# Patient Record
Sex: Male | Born: 1955 | Race: White | Hispanic: No | Smoking: Former smoker
Health system: Southern US, Community
[De-identification: ages and names within clinical notes are randomized; demographics above are authoritative.]

## PROBLEM LIST (undated history)

## (undated) DIAGNOSIS — I2699 Other pulmonary embolism without acute cor pulmonale: Secondary | ICD-10-CM

## (undated) DIAGNOSIS — I2510 Atherosclerotic heart disease of native coronary artery without angina pectoris: Secondary | ICD-10-CM

## (undated) DIAGNOSIS — I82409 Acute embolism and thrombosis of unspecified deep veins of unspecified lower extremity: Secondary | ICD-10-CM

## (undated) DIAGNOSIS — I38 Endocarditis, valve unspecified: Secondary | ICD-10-CM

## (undated) DIAGNOSIS — R011 Cardiac murmur, unspecified: Secondary | ICD-10-CM

## (undated) DIAGNOSIS — H269 Unspecified cataract: Secondary | ICD-10-CM

## (undated) DIAGNOSIS — I10 Essential (primary) hypertension: Secondary | ICD-10-CM

## (undated) DIAGNOSIS — R918 Other nonspecific abnormal finding of lung field: Secondary | ICD-10-CM

## (undated) DIAGNOSIS — J189 Pneumonia, unspecified organism: Secondary | ICD-10-CM

## (undated) DIAGNOSIS — J449 Chronic obstructive pulmonary disease, unspecified: Secondary | ICD-10-CM

## (undated) DIAGNOSIS — F32A Depression, unspecified: Secondary | ICD-10-CM

## (undated) DIAGNOSIS — F329 Major depressive disorder, single episode, unspecified: Secondary | ICD-10-CM

## (undated) DIAGNOSIS — I219 Acute myocardial infarction, unspecified: Secondary | ICD-10-CM

## (undated) DIAGNOSIS — D649 Anemia, unspecified: Secondary | ICD-10-CM

## (undated) DIAGNOSIS — Z86718 Personal history of other venous thrombosis and embolism: Secondary | ICD-10-CM

## (undated) DIAGNOSIS — J45909 Unspecified asthma, uncomplicated: Secondary | ICD-10-CM

## (undated) DIAGNOSIS — K219 Gastro-esophageal reflux disease without esophagitis: Secondary | ICD-10-CM

## (undated) DIAGNOSIS — J439 Emphysema, unspecified: Secondary | ICD-10-CM

## (undated) DIAGNOSIS — Z8489 Family history of other specified conditions: Secondary | ICD-10-CM

## (undated) DIAGNOSIS — E119 Type 2 diabetes mellitus without complications: Secondary | ICD-10-CM

## (undated) DIAGNOSIS — Z21 Asymptomatic human immunodeficiency virus [HIV] infection status: Secondary | ICD-10-CM

## (undated) DIAGNOSIS — I499 Cardiac arrhythmia, unspecified: Secondary | ICD-10-CM

## (undated) DIAGNOSIS — J3089 Other allergic rhinitis: Secondary | ICD-10-CM

## (undated) DIAGNOSIS — B20 Human immunodeficiency virus [HIV] disease: Secondary | ICD-10-CM

## (undated) DIAGNOSIS — M359 Systemic involvement of connective tissue, unspecified: Secondary | ICD-10-CM

## (undated) HISTORY — PX: CARDIAC CATHETERIZATION: SHX172

## (undated) HISTORY — PX: WRIST ARTHROSCOPY: SUR100

## (undated) HISTORY — PX: IVC FILTER INSERTION: CATH118245

## (undated) HISTORY — PX: ANKLE ARTHROSCOPY: SUR85

## (undated) HISTORY — PX: BACK SURGERY: SHX140

## (undated) HISTORY — PX: TONSILLECTOMY: SUR1361

## (undated) HISTORY — PX: SINUS EXPLORATION: SHX5214

---

## 2000-12-01 ENCOUNTER — Emergency Department (HOSPITAL_COMMUNITY): Admission: EM | Admit: 2000-12-01 | Discharge: 2000-12-01 | Payer: Self-pay | Admitting: Emergency Medicine

## 2001-09-22 ENCOUNTER — Emergency Department (HOSPITAL_COMMUNITY): Admission: EM | Admit: 2001-09-22 | Discharge: 2001-09-22 | Payer: Self-pay | Admitting: Emergency Medicine

## 2005-02-11 ENCOUNTER — Emergency Department: Payer: Self-pay | Admitting: Emergency Medicine

## 2005-08-25 ENCOUNTER — Ambulatory Visit: Payer: Self-pay | Admitting: Internal Medicine

## 2005-09-20 ENCOUNTER — Ambulatory Visit: Payer: Self-pay | Admitting: Cardiovascular Disease

## 2005-10-06 ENCOUNTER — Emergency Department: Payer: Self-pay | Admitting: Emergency Medicine

## 2005-10-26 ENCOUNTER — Ambulatory Visit: Payer: Self-pay | Admitting: Podiatry

## 2005-12-08 ENCOUNTER — Ambulatory Visit: Payer: Self-pay | Admitting: Podiatry

## 2005-12-27 ENCOUNTER — Ambulatory Visit: Payer: Self-pay | Admitting: Internal Medicine

## 2005-12-31 ENCOUNTER — Ambulatory Visit: Payer: Self-pay | Admitting: Internal Medicine

## 2006-01-13 ENCOUNTER — Inpatient Hospital Stay: Payer: Self-pay | Admitting: Internal Medicine

## 2006-03-08 ENCOUNTER — Ambulatory Visit: Payer: Self-pay

## 2006-03-14 ENCOUNTER — Encounter: Payer: Self-pay | Admitting: Family Medicine

## 2006-04-02 ENCOUNTER — Emergency Department: Payer: Self-pay | Admitting: Emergency Medicine

## 2006-06-08 ENCOUNTER — Ambulatory Visit: Payer: Self-pay | Admitting: Unknown Physician Specialty

## 2007-08-18 ENCOUNTER — Other Ambulatory Visit: Payer: Self-pay

## 2007-08-18 ENCOUNTER — Observation Stay: Payer: Self-pay | Admitting: Internal Medicine

## 2007-10-02 ENCOUNTER — Other Ambulatory Visit: Payer: Self-pay

## 2007-10-03 ENCOUNTER — Inpatient Hospital Stay: Payer: Self-pay | Admitting: Internal Medicine

## 2007-10-04 ENCOUNTER — Inpatient Hospital Stay: Payer: Self-pay | Admitting: Unknown Physician Specialty

## 2007-12-04 ENCOUNTER — Other Ambulatory Visit: Payer: Self-pay

## 2007-12-04 ENCOUNTER — Emergency Department: Payer: Self-pay | Admitting: Emergency Medicine

## 2008-07-17 ENCOUNTER — Ambulatory Visit: Payer: Self-pay | Admitting: Internal Medicine

## 2008-09-10 ENCOUNTER — Ambulatory Visit: Payer: Self-pay | Admitting: Otolaryngology

## 2008-09-19 ENCOUNTER — Ambulatory Visit: Payer: Self-pay | Admitting: Otolaryngology

## 2008-12-03 ENCOUNTER — Ambulatory Visit: Payer: Self-pay | Admitting: Internal Medicine

## 2009-01-03 ENCOUNTER — Ambulatory Visit: Payer: Self-pay | Admitting: Cardiovascular Disease

## 2009-05-09 ENCOUNTER — Ambulatory Visit: Payer: Self-pay | Admitting: Internal Medicine

## 2009-05-22 ENCOUNTER — Ambulatory Visit: Payer: Self-pay | Admitting: Internal Medicine

## 2009-05-29 ENCOUNTER — Ambulatory Visit: Payer: Self-pay | Admitting: Internal Medicine

## 2009-06-04 ENCOUNTER — Ambulatory Visit: Payer: Self-pay | Admitting: Unknown Physician Specialty

## 2009-06-09 ENCOUNTER — Ambulatory Visit: Payer: Self-pay | Admitting: Internal Medicine

## 2009-07-02 ENCOUNTER — Emergency Department: Payer: Self-pay | Admitting: Emergency Medicine

## 2009-07-09 ENCOUNTER — Ambulatory Visit: Payer: Self-pay | Admitting: Internal Medicine

## 2009-07-25 ENCOUNTER — Ambulatory Visit: Payer: Self-pay | Admitting: Unknown Physician Specialty

## 2009-08-03 ENCOUNTER — Emergency Department: Payer: Self-pay | Admitting: Emergency Medicine

## 2009-08-31 ENCOUNTER — Emergency Department: Payer: Self-pay

## 2009-09-09 ENCOUNTER — Ambulatory Visit: Payer: Self-pay | Admitting: Internal Medicine

## 2009-09-12 ENCOUNTER — Ambulatory Visit: Payer: Self-pay | Admitting: Internal Medicine

## 2009-09-17 ENCOUNTER — Ambulatory Visit: Payer: Self-pay | Admitting: Internal Medicine

## 2009-10-07 ENCOUNTER — Ambulatory Visit: Payer: Self-pay | Admitting: Internal Medicine

## 2009-11-12 ENCOUNTER — Ambulatory Visit: Payer: Self-pay | Admitting: Internal Medicine

## 2009-12-18 ENCOUNTER — Observation Stay: Payer: Self-pay | Admitting: Internal Medicine

## 2010-01-26 ENCOUNTER — Emergency Department: Payer: Self-pay | Admitting: Emergency Medicine

## 2010-01-30 ENCOUNTER — Ambulatory Visit: Payer: Self-pay | Admitting: Internal Medicine

## 2010-02-16 ENCOUNTER — Encounter: Payer: Self-pay | Admitting: Internal Medicine

## 2010-03-09 ENCOUNTER — Encounter: Payer: Self-pay | Admitting: Internal Medicine

## 2010-03-09 ENCOUNTER — Ambulatory Visit: Payer: Self-pay | Admitting: Internal Medicine

## 2010-03-13 ENCOUNTER — Ambulatory Visit: Payer: Self-pay | Admitting: Internal Medicine

## 2010-03-18 ENCOUNTER — Ambulatory Visit: Payer: Self-pay | Admitting: Internal Medicine

## 2010-04-09 ENCOUNTER — Ambulatory Visit: Payer: Self-pay | Admitting: Internal Medicine

## 2010-04-09 ENCOUNTER — Encounter: Payer: Self-pay | Admitting: Internal Medicine

## 2010-06-13 ENCOUNTER — Emergency Department: Payer: Self-pay | Admitting: Emergency Medicine

## 2010-06-24 ENCOUNTER — Ambulatory Visit: Payer: Self-pay | Admitting: Cardiovascular Disease

## 2010-07-14 ENCOUNTER — Ambulatory Visit: Payer: Self-pay | Admitting: Internal Medicine

## 2010-08-09 ENCOUNTER — Ambulatory Visit: Payer: Self-pay | Admitting: Internal Medicine

## 2010-11-04 ENCOUNTER — Ambulatory Visit: Payer: Self-pay | Admitting: Rheumatology

## 2010-11-23 ENCOUNTER — Ambulatory Visit: Payer: Self-pay | Admitting: Orthopedic Surgery

## 2010-11-24 ENCOUNTER — Ambulatory Visit: Payer: Self-pay | Admitting: Orthopedic Surgery

## 2010-11-25 ENCOUNTER — Encounter: Payer: Self-pay | Admitting: Orthopedic Surgery

## 2010-12-08 ENCOUNTER — Encounter: Payer: Self-pay | Admitting: Orthopedic Surgery

## 2011-02-24 ENCOUNTER — Emergency Department: Payer: Self-pay | Admitting: Emergency Medicine

## 2011-03-01 ENCOUNTER — Emergency Department: Payer: Self-pay | Admitting: *Deleted

## 2011-05-25 ENCOUNTER — Ambulatory Visit: Payer: Self-pay | Admitting: Internal Medicine

## 2011-06-30 ENCOUNTER — Ambulatory Visit: Payer: Self-pay | Admitting: Cardiovascular Disease

## 2011-07-08 ENCOUNTER — Ambulatory Visit: Payer: Self-pay | Admitting: Orthopedic Surgery

## 2011-08-11 DIAGNOSIS — J301 Allergic rhinitis due to pollen: Secondary | ICD-10-CM | POA: Diagnosis not present

## 2011-08-18 DIAGNOSIS — J301 Allergic rhinitis due to pollen: Secondary | ICD-10-CM | POA: Diagnosis not present

## 2011-08-19 DIAGNOSIS — I10 Essential (primary) hypertension: Secondary | ICD-10-CM | POA: Diagnosis not present

## 2011-08-25 DIAGNOSIS — J301 Allergic rhinitis due to pollen: Secondary | ICD-10-CM | POA: Diagnosis not present

## 2011-08-26 DIAGNOSIS — R809 Proteinuria, unspecified: Secondary | ICD-10-CM | POA: Diagnosis not present

## 2011-08-26 DIAGNOSIS — I2510 Atherosclerotic heart disease of native coronary artery without angina pectoris: Secondary | ICD-10-CM | POA: Diagnosis not present

## 2011-08-26 DIAGNOSIS — I10 Essential (primary) hypertension: Secondary | ICD-10-CM | POA: Diagnosis not present

## 2011-09-01 DIAGNOSIS — J301 Allergic rhinitis due to pollen: Secondary | ICD-10-CM | POA: Diagnosis not present

## 2011-09-02 DIAGNOSIS — R0602 Shortness of breath: Secondary | ICD-10-CM | POA: Diagnosis not present

## 2011-09-02 DIAGNOSIS — J449 Chronic obstructive pulmonary disease, unspecified: Secondary | ICD-10-CM | POA: Diagnosis not present

## 2011-09-02 DIAGNOSIS — I2510 Atherosclerotic heart disease of native coronary artery without angina pectoris: Secondary | ICD-10-CM | POA: Diagnosis not present

## 2011-09-02 DIAGNOSIS — J438 Other emphysema: Secondary | ICD-10-CM | POA: Diagnosis not present

## 2011-09-02 DIAGNOSIS — R062 Wheezing: Secondary | ICD-10-CM | POA: Diagnosis not present

## 2011-09-08 DIAGNOSIS — J301 Allergic rhinitis due to pollen: Secondary | ICD-10-CM | POA: Diagnosis not present

## 2011-09-09 DIAGNOSIS — I2510 Atherosclerotic heart disease of native coronary artery without angina pectoris: Secondary | ICD-10-CM | POA: Diagnosis not present

## 2011-09-09 DIAGNOSIS — Z21 Asymptomatic human immunodeficiency virus [HIV] infection status: Secondary | ICD-10-CM | POA: Diagnosis not present

## 2011-09-09 DIAGNOSIS — B20 Human immunodeficiency virus [HIV] disease: Secondary | ICD-10-CM | POA: Diagnosis not present

## 2011-09-09 DIAGNOSIS — J449 Chronic obstructive pulmonary disease, unspecified: Secondary | ICD-10-CM | POA: Diagnosis not present

## 2011-09-09 DIAGNOSIS — E119 Type 2 diabetes mellitus without complications: Secondary | ICD-10-CM | POA: Diagnosis not present

## 2011-09-15 DIAGNOSIS — R0602 Shortness of breath: Secondary | ICD-10-CM | POA: Diagnosis not present

## 2011-09-22 DIAGNOSIS — J301 Allergic rhinitis due to pollen: Secondary | ICD-10-CM | POA: Diagnosis not present

## 2011-09-28 DIAGNOSIS — J45909 Unspecified asthma, uncomplicated: Secondary | ICD-10-CM | POA: Diagnosis not present

## 2011-09-28 DIAGNOSIS — M159 Polyosteoarthritis, unspecified: Secondary | ICD-10-CM | POA: Diagnosis not present

## 2011-09-28 DIAGNOSIS — B20 Human immunodeficiency virus [HIV] disease: Secondary | ICD-10-CM | POA: Diagnosis not present

## 2011-09-28 DIAGNOSIS — R197 Diarrhea, unspecified: Secondary | ICD-10-CM | POA: Diagnosis not present

## 2011-09-28 DIAGNOSIS — R42 Dizziness and giddiness: Secondary | ICD-10-CM | POA: Diagnosis not present

## 2011-09-28 DIAGNOSIS — K529 Noninfective gastroenteritis and colitis, unspecified: Secondary | ICD-10-CM | POA: Diagnosis not present

## 2011-09-29 DIAGNOSIS — R197 Diarrhea, unspecified: Secondary | ICD-10-CM | POA: Diagnosis not present

## 2011-09-29 DIAGNOSIS — K529 Noninfective gastroenteritis and colitis, unspecified: Secondary | ICD-10-CM | POA: Diagnosis not present

## 2011-10-02 ENCOUNTER — Emergency Department: Payer: Self-pay | Admitting: Emergency Medicine

## 2011-10-02 DIAGNOSIS — R1031 Right lower quadrant pain: Secondary | ICD-10-CM | POA: Diagnosis not present

## 2011-10-02 DIAGNOSIS — R197 Diarrhea, unspecified: Secondary | ICD-10-CM | POA: Diagnosis not present

## 2011-10-02 LAB — COMPREHENSIVE METABOLIC PANEL
Albumin: 4.2 g/dL (ref 3.4–5.0)
Alkaline Phosphatase: 187 U/L — ABNORMAL HIGH (ref 50–136)
Anion Gap: 12 (ref 7–16)
BUN: 16 mg/dL (ref 7–18)
Calcium, Total: 8.8 mg/dL (ref 8.5–10.1)
Co2: 22 mmol/L (ref 21–32)
EGFR (Non-African Amer.): >60
Glucose: 138 mg/dL — ABNORMAL HIGH (ref 65–99)
Osmolality: 292 (ref 275–301)
Potassium: 3.9 mmol/L (ref 3.5–5.1)
SGOT(AST): 28 U/L (ref 15–37)
Sodium: 145 mmol/L (ref 136–145)

## 2011-10-02 LAB — CBC
HCT: 39.9 % — ABNORMAL LOW (ref 40.0–52.0)
MCV: 98 fL (ref 80–100)
Platelet: 232 10*3/uL (ref 150–440)
RBC: 4.06 10*6/uL — ABNORMAL LOW (ref 4.40–5.90)
WBC: 4.9 10*3/uL (ref 3.8–10.6)

## 2011-10-04 DIAGNOSIS — I2510 Atherosclerotic heart disease of native coronary artery without angina pectoris: Secondary | ICD-10-CM | POA: Diagnosis not present

## 2011-10-04 DIAGNOSIS — K219 Gastro-esophageal reflux disease without esophagitis: Secondary | ICD-10-CM | POA: Diagnosis not present

## 2011-10-04 DIAGNOSIS — J449 Chronic obstructive pulmonary disease, unspecified: Secondary | ICD-10-CM | POA: Diagnosis not present

## 2011-10-04 DIAGNOSIS — I10 Essential (primary) hypertension: Secondary | ICD-10-CM | POA: Diagnosis not present

## 2011-10-05 DIAGNOSIS — R5383 Other fatigue: Secondary | ICD-10-CM | POA: Diagnosis not present

## 2011-10-05 DIAGNOSIS — R5381 Other malaise: Secondary | ICD-10-CM | POA: Diagnosis not present

## 2011-10-05 DIAGNOSIS — K529 Noninfective gastroenteritis and colitis, unspecified: Secondary | ICD-10-CM | POA: Diagnosis not present

## 2011-10-05 DIAGNOSIS — R197 Diarrhea, unspecified: Secondary | ICD-10-CM | POA: Diagnosis not present

## 2011-10-05 DIAGNOSIS — B20 Human immunodeficiency virus [HIV] disease: Secondary | ICD-10-CM | POA: Diagnosis not present

## 2011-10-05 DIAGNOSIS — J449 Chronic obstructive pulmonary disease, unspecified: Secondary | ICD-10-CM | POA: Diagnosis not present

## 2011-10-05 DIAGNOSIS — F411 Generalized anxiety disorder: Secondary | ICD-10-CM | POA: Diagnosis not present

## 2011-10-06 DIAGNOSIS — I10 Essential (primary) hypertension: Secondary | ICD-10-CM | POA: Diagnosis not present

## 2011-10-06 DIAGNOSIS — R197 Diarrhea, unspecified: Secondary | ICD-10-CM | POA: Diagnosis not present

## 2011-10-06 DIAGNOSIS — Z9981 Dependence on supplemental oxygen: Secondary | ICD-10-CM | POA: Diagnosis not present

## 2011-10-06 DIAGNOSIS — K529 Noninfective gastroenteritis and colitis, unspecified: Secondary | ICD-10-CM | POA: Diagnosis not present

## 2011-10-06 DIAGNOSIS — R279 Unspecified lack of coordination: Secondary | ICD-10-CM | POA: Diagnosis not present

## 2011-10-06 DIAGNOSIS — F329 Major depressive disorder, single episode, unspecified: Secondary | ICD-10-CM | POA: Diagnosis not present

## 2011-10-06 DIAGNOSIS — J449 Chronic obstructive pulmonary disease, unspecified: Secondary | ICD-10-CM | POA: Diagnosis not present

## 2011-10-06 DIAGNOSIS — I2510 Atherosclerotic heart disease of native coronary artery without angina pectoris: Secondary | ICD-10-CM | POA: Diagnosis not present

## 2011-10-06 DIAGNOSIS — R269 Unspecified abnormalities of gait and mobility: Secondary | ICD-10-CM | POA: Diagnosis not present

## 2011-10-06 DIAGNOSIS — Z21 Asymptomatic human immunodeficiency virus [HIV] infection status: Secondary | ICD-10-CM | POA: Diagnosis not present

## 2011-10-06 DIAGNOSIS — R42 Dizziness and giddiness: Secondary | ICD-10-CM | POA: Diagnosis not present

## 2011-10-06 DIAGNOSIS — Z9181 History of falling: Secondary | ICD-10-CM | POA: Diagnosis not present

## 2011-10-12 DIAGNOSIS — R279 Unspecified lack of coordination: Secondary | ICD-10-CM | POA: Diagnosis not present

## 2011-10-12 DIAGNOSIS — K529 Noninfective gastroenteritis and colitis, unspecified: Secondary | ICD-10-CM | POA: Diagnosis not present

## 2011-10-12 DIAGNOSIS — R42 Dizziness and giddiness: Secondary | ICD-10-CM | POA: Diagnosis not present

## 2011-10-12 DIAGNOSIS — R197 Diarrhea, unspecified: Secondary | ICD-10-CM | POA: Diagnosis not present

## 2011-10-12 DIAGNOSIS — I2510 Atherosclerotic heart disease of native coronary artery without angina pectoris: Secondary | ICD-10-CM | POA: Diagnosis not present

## 2011-10-12 DIAGNOSIS — J449 Chronic obstructive pulmonary disease, unspecified: Secondary | ICD-10-CM | POA: Diagnosis not present

## 2011-10-14 DIAGNOSIS — K529 Noninfective gastroenteritis and colitis, unspecified: Secondary | ICD-10-CM | POA: Diagnosis not present

## 2011-10-14 DIAGNOSIS — R279 Unspecified lack of coordination: Secondary | ICD-10-CM | POA: Diagnosis not present

## 2011-10-14 DIAGNOSIS — R197 Diarrhea, unspecified: Secondary | ICD-10-CM | POA: Diagnosis not present

## 2011-10-14 DIAGNOSIS — I2510 Atherosclerotic heart disease of native coronary artery without angina pectoris: Secondary | ICD-10-CM | POA: Diagnosis not present

## 2011-10-14 DIAGNOSIS — R42 Dizziness and giddiness: Secondary | ICD-10-CM | POA: Diagnosis not present

## 2011-10-14 DIAGNOSIS — J449 Chronic obstructive pulmonary disease, unspecified: Secondary | ICD-10-CM | POA: Diagnosis not present

## 2011-10-19 DIAGNOSIS — K529 Noninfective gastroenteritis and colitis, unspecified: Secondary | ICD-10-CM | POA: Diagnosis not present

## 2011-10-19 DIAGNOSIS — I2510 Atherosclerotic heart disease of native coronary artery without angina pectoris: Secondary | ICD-10-CM | POA: Diagnosis not present

## 2011-10-19 DIAGNOSIS — R279 Unspecified lack of coordination: Secondary | ICD-10-CM | POA: Diagnosis not present

## 2011-10-19 DIAGNOSIS — R42 Dizziness and giddiness: Secondary | ICD-10-CM | POA: Diagnosis not present

## 2011-10-19 DIAGNOSIS — R197 Diarrhea, unspecified: Secondary | ICD-10-CM | POA: Diagnosis not present

## 2011-10-19 DIAGNOSIS — J449 Chronic obstructive pulmonary disease, unspecified: Secondary | ICD-10-CM | POA: Diagnosis not present

## 2011-10-21 DIAGNOSIS — J449 Chronic obstructive pulmonary disease, unspecified: Secondary | ICD-10-CM | POA: Diagnosis not present

## 2011-10-21 DIAGNOSIS — K529 Noninfective gastroenteritis and colitis, unspecified: Secondary | ICD-10-CM | POA: Diagnosis not present

## 2011-10-21 DIAGNOSIS — I2510 Atherosclerotic heart disease of native coronary artery without angina pectoris: Secondary | ICD-10-CM | POA: Diagnosis not present

## 2011-10-21 DIAGNOSIS — K5730 Diverticulosis of large intestine without perforation or abscess without bleeding: Secondary | ICD-10-CM | POA: Diagnosis not present

## 2011-10-21 DIAGNOSIS — R42 Dizziness and giddiness: Secondary | ICD-10-CM | POA: Diagnosis not present

## 2011-10-21 DIAGNOSIS — R279 Unspecified lack of coordination: Secondary | ICD-10-CM | POA: Diagnosis not present

## 2011-10-21 DIAGNOSIS — R197 Diarrhea, unspecified: Secondary | ICD-10-CM | POA: Diagnosis not present

## 2011-10-22 DIAGNOSIS — R279 Unspecified lack of coordination: Secondary | ICD-10-CM | POA: Diagnosis not present

## 2011-10-22 DIAGNOSIS — R42 Dizziness and giddiness: Secondary | ICD-10-CM | POA: Diagnosis not present

## 2011-10-22 DIAGNOSIS — J449 Chronic obstructive pulmonary disease, unspecified: Secondary | ICD-10-CM | POA: Diagnosis not present

## 2011-10-22 DIAGNOSIS — J301 Allergic rhinitis due to pollen: Secondary | ICD-10-CM | POA: Diagnosis not present

## 2011-10-22 DIAGNOSIS — I2510 Atherosclerotic heart disease of native coronary artery without angina pectoris: Secondary | ICD-10-CM | POA: Diagnosis not present

## 2011-10-22 DIAGNOSIS — K529 Noninfective gastroenteritis and colitis, unspecified: Secondary | ICD-10-CM | POA: Diagnosis not present

## 2011-10-22 DIAGNOSIS — R197 Diarrhea, unspecified: Secondary | ICD-10-CM | POA: Diagnosis not present

## 2011-10-25 DIAGNOSIS — R197 Diarrhea, unspecified: Secondary | ICD-10-CM | POA: Diagnosis not present

## 2011-10-25 DIAGNOSIS — I2510 Atherosclerotic heart disease of native coronary artery without angina pectoris: Secondary | ICD-10-CM | POA: Diagnosis not present

## 2011-10-25 DIAGNOSIS — R42 Dizziness and giddiness: Secondary | ICD-10-CM | POA: Diagnosis not present

## 2011-10-25 DIAGNOSIS — K529 Noninfective gastroenteritis and colitis, unspecified: Secondary | ICD-10-CM | POA: Diagnosis not present

## 2011-10-25 DIAGNOSIS — R279 Unspecified lack of coordination: Secondary | ICD-10-CM | POA: Diagnosis not present

## 2011-10-25 DIAGNOSIS — J449 Chronic obstructive pulmonary disease, unspecified: Secondary | ICD-10-CM | POA: Diagnosis not present

## 2011-10-26 DIAGNOSIS — J449 Chronic obstructive pulmonary disease, unspecified: Secondary | ICD-10-CM | POA: Diagnosis not present

## 2011-10-26 DIAGNOSIS — B20 Human immunodeficiency virus [HIV] disease: Secondary | ICD-10-CM | POA: Diagnosis not present

## 2011-10-26 DIAGNOSIS — E782 Mixed hyperlipidemia: Secondary | ICD-10-CM | POA: Diagnosis not present

## 2011-10-26 DIAGNOSIS — E119 Type 2 diabetes mellitus without complications: Secondary | ICD-10-CM | POA: Diagnosis not present

## 2011-10-26 DIAGNOSIS — R197 Diarrhea, unspecified: Secondary | ICD-10-CM | POA: Diagnosis not present

## 2011-10-26 DIAGNOSIS — J438 Other emphysema: Secondary | ICD-10-CM | POA: Diagnosis not present

## 2011-10-26 DIAGNOSIS — K529 Noninfective gastroenteritis and colitis, unspecified: Secondary | ICD-10-CM | POA: Diagnosis not present

## 2011-10-26 DIAGNOSIS — I10 Essential (primary) hypertension: Secondary | ICD-10-CM | POA: Diagnosis not present

## 2011-10-27 DIAGNOSIS — K529 Noninfective gastroenteritis and colitis, unspecified: Secondary | ICD-10-CM | POA: Diagnosis not present

## 2011-10-27 DIAGNOSIS — R42 Dizziness and giddiness: Secondary | ICD-10-CM | POA: Diagnosis not present

## 2011-10-27 DIAGNOSIS — R197 Diarrhea, unspecified: Secondary | ICD-10-CM | POA: Diagnosis not present

## 2011-10-27 DIAGNOSIS — J301 Allergic rhinitis due to pollen: Secondary | ICD-10-CM | POA: Diagnosis not present

## 2011-10-27 DIAGNOSIS — I2510 Atherosclerotic heart disease of native coronary artery without angina pectoris: Secondary | ICD-10-CM | POA: Diagnosis not present

## 2011-10-27 DIAGNOSIS — J449 Chronic obstructive pulmonary disease, unspecified: Secondary | ICD-10-CM | POA: Diagnosis not present

## 2011-10-27 DIAGNOSIS — R279 Unspecified lack of coordination: Secondary | ICD-10-CM | POA: Diagnosis not present

## 2011-10-29 DIAGNOSIS — R197 Diarrhea, unspecified: Secondary | ICD-10-CM | POA: Diagnosis not present

## 2011-10-29 DIAGNOSIS — J449 Chronic obstructive pulmonary disease, unspecified: Secondary | ICD-10-CM | POA: Diagnosis not present

## 2011-10-29 DIAGNOSIS — R42 Dizziness and giddiness: Secondary | ICD-10-CM | POA: Diagnosis not present

## 2011-10-29 DIAGNOSIS — I2510 Atherosclerotic heart disease of native coronary artery without angina pectoris: Secondary | ICD-10-CM | POA: Diagnosis not present

## 2011-10-29 DIAGNOSIS — R279 Unspecified lack of coordination: Secondary | ICD-10-CM | POA: Diagnosis not present

## 2011-10-29 DIAGNOSIS — K529 Noninfective gastroenteritis and colitis, unspecified: Secondary | ICD-10-CM | POA: Diagnosis not present

## 2011-11-02 DIAGNOSIS — R197 Diarrhea, unspecified: Secondary | ICD-10-CM | POA: Diagnosis not present

## 2011-11-04 DIAGNOSIS — J449 Chronic obstructive pulmonary disease, unspecified: Secondary | ICD-10-CM | POA: Diagnosis not present

## 2011-11-04 DIAGNOSIS — R42 Dizziness and giddiness: Secondary | ICD-10-CM | POA: Diagnosis not present

## 2011-11-04 DIAGNOSIS — K529 Noninfective gastroenteritis and colitis, unspecified: Secondary | ICD-10-CM | POA: Diagnosis not present

## 2011-11-04 DIAGNOSIS — R197 Diarrhea, unspecified: Secondary | ICD-10-CM | POA: Diagnosis not present

## 2011-11-04 DIAGNOSIS — I2510 Atherosclerotic heart disease of native coronary artery without angina pectoris: Secondary | ICD-10-CM | POA: Diagnosis not present

## 2011-11-04 DIAGNOSIS — R279 Unspecified lack of coordination: Secondary | ICD-10-CM | POA: Diagnosis not present

## 2011-11-11 DIAGNOSIS — J301 Allergic rhinitis due to pollen: Secondary | ICD-10-CM | POA: Diagnosis not present

## 2011-11-18 DIAGNOSIS — J301 Allergic rhinitis due to pollen: Secondary | ICD-10-CM | POA: Diagnosis not present

## 2011-11-23 DIAGNOSIS — I10 Essential (primary) hypertension: Secondary | ICD-10-CM | POA: Diagnosis not present

## 2011-11-23 DIAGNOSIS — B20 Human immunodeficiency virus [HIV] disease: Secondary | ICD-10-CM | POA: Diagnosis not present

## 2011-11-23 DIAGNOSIS — K529 Noninfective gastroenteritis and colitis, unspecified: Secondary | ICD-10-CM | POA: Diagnosis not present

## 2011-11-23 DIAGNOSIS — R197 Diarrhea, unspecified: Secondary | ICD-10-CM | POA: Diagnosis not present

## 2011-11-23 DIAGNOSIS — E119 Type 2 diabetes mellitus without complications: Secondary | ICD-10-CM | POA: Diagnosis not present

## 2011-11-23 DIAGNOSIS — J309 Allergic rhinitis, unspecified: Secondary | ICD-10-CM | POA: Diagnosis not present

## 2011-11-23 DIAGNOSIS — J449 Chronic obstructive pulmonary disease, unspecified: Secondary | ICD-10-CM | POA: Diagnosis not present

## 2011-11-23 DIAGNOSIS — E782 Mixed hyperlipidemia: Secondary | ICD-10-CM | POA: Diagnosis not present

## 2011-11-24 DIAGNOSIS — R945 Abnormal results of liver function studies: Secondary | ICD-10-CM | POA: Diagnosis not present

## 2011-11-25 DIAGNOSIS — J301 Allergic rhinitis due to pollen: Secondary | ICD-10-CM | POA: Diagnosis not present

## 2011-12-02 DIAGNOSIS — J449 Chronic obstructive pulmonary disease, unspecified: Secondary | ICD-10-CM | POA: Diagnosis not present

## 2011-12-07 DIAGNOSIS — R945 Abnormal results of liver function studies: Secondary | ICD-10-CM | POA: Diagnosis not present

## 2011-12-09 DIAGNOSIS — B20 Human immunodeficiency virus [HIV] disease: Secondary | ICD-10-CM | POA: Diagnosis not present

## 2011-12-09 DIAGNOSIS — J449 Chronic obstructive pulmonary disease, unspecified: Secondary | ICD-10-CM | POA: Diagnosis not present

## 2011-12-09 DIAGNOSIS — E7800 Pure hypercholesterolemia, unspecified: Secondary | ICD-10-CM | POA: Diagnosis not present

## 2011-12-09 DIAGNOSIS — E119 Type 2 diabetes mellitus without complications: Secondary | ICD-10-CM | POA: Diagnosis not present

## 2011-12-16 DIAGNOSIS — J301 Allergic rhinitis due to pollen: Secondary | ICD-10-CM | POA: Diagnosis not present

## 2011-12-16 DIAGNOSIS — B20 Human immunodeficiency virus [HIV] disease: Secondary | ICD-10-CM | POA: Diagnosis not present

## 2011-12-23 DIAGNOSIS — J301 Allergic rhinitis due to pollen: Secondary | ICD-10-CM | POA: Diagnosis not present

## 2011-12-30 DIAGNOSIS — J301 Allergic rhinitis due to pollen: Secondary | ICD-10-CM | POA: Diagnosis not present

## 2012-01-04 DIAGNOSIS — R079 Chest pain, unspecified: Secondary | ICD-10-CM | POA: Diagnosis not present

## 2012-01-04 DIAGNOSIS — E785 Hyperlipidemia, unspecified: Secondary | ICD-10-CM | POA: Diagnosis not present

## 2012-01-04 DIAGNOSIS — R0789 Other chest pain: Secondary | ICD-10-CM | POA: Diagnosis not present

## 2012-01-04 DIAGNOSIS — I2510 Atherosclerotic heart disease of native coronary artery without angina pectoris: Secondary | ICD-10-CM | POA: Diagnosis not present

## 2012-01-04 DIAGNOSIS — R011 Cardiac murmur, unspecified: Secondary | ICD-10-CM | POA: Diagnosis not present

## 2012-01-04 DIAGNOSIS — K219 Gastro-esophageal reflux disease without esophagitis: Secondary | ICD-10-CM | POA: Diagnosis not present

## 2012-01-04 DIAGNOSIS — I10 Essential (primary) hypertension: Secondary | ICD-10-CM | POA: Diagnosis not present

## 2012-01-04 DIAGNOSIS — R072 Precordial pain: Secondary | ICD-10-CM | POA: Diagnosis not present

## 2012-01-05 DIAGNOSIS — I2510 Atherosclerotic heart disease of native coronary artery without angina pectoris: Secondary | ICD-10-CM | POA: Diagnosis not present

## 2012-01-05 DIAGNOSIS — R0789 Other chest pain: Secondary | ICD-10-CM | POA: Diagnosis not present

## 2012-01-05 DIAGNOSIS — R011 Cardiac murmur, unspecified: Secondary | ICD-10-CM | POA: Diagnosis not present

## 2012-01-05 DIAGNOSIS — R072 Precordial pain: Secondary | ICD-10-CM | POA: Diagnosis not present

## 2012-01-05 DIAGNOSIS — I10 Essential (primary) hypertension: Secondary | ICD-10-CM | POA: Diagnosis not present

## 2012-01-05 DIAGNOSIS — E785 Hyperlipidemia, unspecified: Secondary | ICD-10-CM | POA: Diagnosis not present

## 2012-01-06 DIAGNOSIS — E785 Hyperlipidemia, unspecified: Secondary | ICD-10-CM | POA: Diagnosis not present

## 2012-01-06 DIAGNOSIS — R011 Cardiac murmur, unspecified: Secondary | ICD-10-CM | POA: Diagnosis not present

## 2012-01-06 DIAGNOSIS — I2510 Atherosclerotic heart disease of native coronary artery without angina pectoris: Secondary | ICD-10-CM | POA: Diagnosis not present

## 2012-01-06 DIAGNOSIS — R072 Precordial pain: Secondary | ICD-10-CM | POA: Diagnosis not present

## 2012-01-06 DIAGNOSIS — R0789 Other chest pain: Secondary | ICD-10-CM | POA: Diagnosis not present

## 2012-01-06 DIAGNOSIS — I10 Essential (primary) hypertension: Secondary | ICD-10-CM | POA: Diagnosis not present

## 2012-01-11 DIAGNOSIS — J449 Chronic obstructive pulmonary disease, unspecified: Secondary | ICD-10-CM | POA: Diagnosis not present

## 2012-01-11 DIAGNOSIS — R0602 Shortness of breath: Secondary | ICD-10-CM | POA: Diagnosis not present

## 2012-01-11 DIAGNOSIS — N179 Acute kidney failure, unspecified: Secondary | ICD-10-CM | POA: Diagnosis not present

## 2012-01-13 DIAGNOSIS — L820 Inflamed seborrheic keratosis: Secondary | ICD-10-CM | POA: Diagnosis not present

## 2012-01-13 DIAGNOSIS — L819 Disorder of pigmentation, unspecified: Secondary | ICD-10-CM | POA: Diagnosis not present

## 2012-01-14 DIAGNOSIS — J301 Allergic rhinitis due to pollen: Secondary | ICD-10-CM | POA: Diagnosis not present

## 2012-01-20 DIAGNOSIS — J301 Allergic rhinitis due to pollen: Secondary | ICD-10-CM | POA: Diagnosis not present

## 2012-01-24 DIAGNOSIS — R5381 Other malaise: Secondary | ICD-10-CM | POA: Diagnosis not present

## 2012-01-24 DIAGNOSIS — B20 Human immunodeficiency virus [HIV] disease: Secondary | ICD-10-CM | POA: Diagnosis not present

## 2012-01-24 DIAGNOSIS — F411 Generalized anxiety disorder: Secondary | ICD-10-CM | POA: Diagnosis not present

## 2012-01-24 DIAGNOSIS — E782 Mixed hyperlipidemia: Secondary | ICD-10-CM | POA: Diagnosis not present

## 2012-01-24 DIAGNOSIS — R1084 Generalized abdominal pain: Secondary | ICD-10-CM | POA: Diagnosis not present

## 2012-01-24 DIAGNOSIS — K529 Noninfective gastroenteritis and colitis, unspecified: Secondary | ICD-10-CM | POA: Diagnosis not present

## 2012-01-24 DIAGNOSIS — E119 Type 2 diabetes mellitus without complications: Secondary | ICD-10-CM | POA: Diagnosis not present

## 2012-01-24 DIAGNOSIS — I10 Essential (primary) hypertension: Secondary | ICD-10-CM | POA: Diagnosis not present

## 2012-01-24 DIAGNOSIS — R5383 Other fatigue: Secondary | ICD-10-CM | POA: Diagnosis not present

## 2012-01-27 DIAGNOSIS — J301 Allergic rhinitis due to pollen: Secondary | ICD-10-CM | POA: Diagnosis not present

## 2012-02-03 DIAGNOSIS — J301 Allergic rhinitis due to pollen: Secondary | ICD-10-CM | POA: Diagnosis not present

## 2012-02-17 DIAGNOSIS — J301 Allergic rhinitis due to pollen: Secondary | ICD-10-CM | POA: Diagnosis not present

## 2012-02-24 DIAGNOSIS — J301 Allergic rhinitis due to pollen: Secondary | ICD-10-CM | POA: Diagnosis not present

## 2012-03-02 DIAGNOSIS — J301 Allergic rhinitis due to pollen: Secondary | ICD-10-CM | POA: Diagnosis not present

## 2012-03-08 DIAGNOSIS — E119 Type 2 diabetes mellitus without complications: Secondary | ICD-10-CM | POA: Diagnosis not present

## 2012-03-08 DIAGNOSIS — B20 Human immunodeficiency virus [HIV] disease: Secondary | ICD-10-CM | POA: Diagnosis not present

## 2012-03-08 DIAGNOSIS — E7800 Pure hypercholesterolemia, unspecified: Secondary | ICD-10-CM | POA: Diagnosis not present

## 2012-03-08 DIAGNOSIS — J449 Chronic obstructive pulmonary disease, unspecified: Secondary | ICD-10-CM | POA: Diagnosis not present

## 2012-03-09 DIAGNOSIS — J301 Allergic rhinitis due to pollen: Secondary | ICD-10-CM | POA: Diagnosis not present

## 2012-03-16 DIAGNOSIS — J301 Allergic rhinitis due to pollen: Secondary | ICD-10-CM | POA: Diagnosis not present

## 2012-03-23 DIAGNOSIS — J301 Allergic rhinitis due to pollen: Secondary | ICD-10-CM | POA: Diagnosis not present

## 2012-03-30 DIAGNOSIS — J301 Allergic rhinitis due to pollen: Secondary | ICD-10-CM | POA: Diagnosis not present

## 2012-03-31 DIAGNOSIS — J301 Allergic rhinitis due to pollen: Secondary | ICD-10-CM | POA: Diagnosis not present

## 2012-04-05 DIAGNOSIS — J438 Other emphysema: Secondary | ICD-10-CM | POA: Diagnosis not present

## 2012-04-05 DIAGNOSIS — J449 Chronic obstructive pulmonary disease, unspecified: Secondary | ICD-10-CM | POA: Diagnosis not present

## 2012-04-06 DIAGNOSIS — J301 Allergic rhinitis due to pollen: Secondary | ICD-10-CM | POA: Diagnosis not present

## 2012-04-07 DIAGNOSIS — E785 Hyperlipidemia, unspecified: Secondary | ICD-10-CM | POA: Diagnosis not present

## 2012-04-07 DIAGNOSIS — K219 Gastro-esophageal reflux disease without esophagitis: Secondary | ICD-10-CM | POA: Diagnosis not present

## 2012-04-07 DIAGNOSIS — I2510 Atherosclerotic heart disease of native coronary artery without angina pectoris: Secondary | ICD-10-CM | POA: Diagnosis not present

## 2012-04-07 DIAGNOSIS — J449 Chronic obstructive pulmonary disease, unspecified: Secondary | ICD-10-CM | POA: Diagnosis not present

## 2012-04-12 DIAGNOSIS — R188 Other ascites: Secondary | ICD-10-CM | POA: Diagnosis not present

## 2012-04-12 DIAGNOSIS — J301 Allergic rhinitis due to pollen: Secondary | ICD-10-CM | POA: Diagnosis not present

## 2012-04-14 DIAGNOSIS — I10 Essential (primary) hypertension: Secondary | ICD-10-CM | POA: Diagnosis not present

## 2012-04-14 DIAGNOSIS — I2510 Atherosclerotic heart disease of native coronary artery without angina pectoris: Secondary | ICD-10-CM | POA: Diagnosis not present

## 2012-04-14 DIAGNOSIS — J449 Chronic obstructive pulmonary disease, unspecified: Secondary | ICD-10-CM | POA: Diagnosis not present

## 2012-04-14 DIAGNOSIS — K219 Gastro-esophageal reflux disease without esophagitis: Secondary | ICD-10-CM | POA: Diagnosis not present

## 2012-04-18 DIAGNOSIS — K759 Inflammatory liver disease, unspecified: Secondary | ICD-10-CM | POA: Diagnosis not present

## 2012-04-20 DIAGNOSIS — J301 Allergic rhinitis due to pollen: Secondary | ICD-10-CM | POA: Diagnosis not present

## 2012-04-24 DIAGNOSIS — I10 Essential (primary) hypertension: Secondary | ICD-10-CM | POA: Diagnosis not present

## 2012-04-24 DIAGNOSIS — B20 Human immunodeficiency virus [HIV] disease: Secondary | ICD-10-CM | POA: Diagnosis not present

## 2012-04-24 DIAGNOSIS — R188 Other ascites: Secondary | ICD-10-CM | POA: Diagnosis not present

## 2012-04-24 DIAGNOSIS — E782 Mixed hyperlipidemia: Secondary | ICD-10-CM | POA: Diagnosis not present

## 2012-04-24 DIAGNOSIS — R0602 Shortness of breath: Secondary | ICD-10-CM | POA: Diagnosis not present

## 2012-04-24 DIAGNOSIS — R609 Edema, unspecified: Secondary | ICD-10-CM | POA: Diagnosis not present

## 2012-04-28 DIAGNOSIS — R609 Edema, unspecified: Secondary | ICD-10-CM | POA: Diagnosis not present

## 2012-04-28 DIAGNOSIS — E870 Hyperosmolality and hypernatremia: Secondary | ICD-10-CM | POA: Diagnosis not present

## 2012-05-04 DIAGNOSIS — J301 Allergic rhinitis due to pollen: Secondary | ICD-10-CM | POA: Diagnosis not present

## 2012-05-04 DIAGNOSIS — Z79899 Other long term (current) drug therapy: Secondary | ICD-10-CM | POA: Diagnosis not present

## 2012-05-04 DIAGNOSIS — I10 Essential (primary) hypertension: Secondary | ICD-10-CM | POA: Diagnosis not present

## 2012-05-08 DIAGNOSIS — J301 Allergic rhinitis due to pollen: Secondary | ICD-10-CM | POA: Diagnosis not present

## 2012-05-16 DIAGNOSIS — R188 Other ascites: Secondary | ICD-10-CM | POA: Diagnosis not present

## 2012-05-16 DIAGNOSIS — R609 Edema, unspecified: Secondary | ICD-10-CM | POA: Diagnosis not present

## 2012-05-19 DIAGNOSIS — R5383 Other fatigue: Secondary | ICD-10-CM | POA: Diagnosis not present

## 2012-05-19 DIAGNOSIS — J45909 Unspecified asthma, uncomplicated: Secondary | ICD-10-CM | POA: Diagnosis not present

## 2012-05-19 DIAGNOSIS — I10 Essential (primary) hypertension: Secondary | ICD-10-CM | POA: Diagnosis not present

## 2012-05-19 DIAGNOSIS — R1084 Generalized abdominal pain: Secondary | ICD-10-CM | POA: Diagnosis not present

## 2012-05-19 DIAGNOSIS — K219 Gastro-esophageal reflux disease without esophagitis: Secondary | ICD-10-CM | POA: Diagnosis not present

## 2012-05-19 DIAGNOSIS — R5381 Other malaise: Secondary | ICD-10-CM | POA: Diagnosis not present

## 2012-05-20 DIAGNOSIS — Z23 Encounter for immunization: Secondary | ICD-10-CM | POA: Diagnosis not present

## 2012-05-25 DIAGNOSIS — J301 Allergic rhinitis due to pollen: Secondary | ICD-10-CM | POA: Diagnosis not present

## 2012-06-01 DIAGNOSIS — J301 Allergic rhinitis due to pollen: Secondary | ICD-10-CM | POA: Diagnosis not present

## 2012-06-07 DIAGNOSIS — E7800 Pure hypercholesterolemia, unspecified: Secondary | ICD-10-CM | POA: Diagnosis not present

## 2012-06-07 DIAGNOSIS — B20 Human immunodeficiency virus [HIV] disease: Secondary | ICD-10-CM | POA: Diagnosis not present

## 2012-06-07 DIAGNOSIS — J449 Chronic obstructive pulmonary disease, unspecified: Secondary | ICD-10-CM | POA: Diagnosis not present

## 2012-06-07 DIAGNOSIS — E119 Type 2 diabetes mellitus without complications: Secondary | ICD-10-CM | POA: Diagnosis not present

## 2012-06-08 DIAGNOSIS — J301 Allergic rhinitis due to pollen: Secondary | ICD-10-CM | POA: Diagnosis not present

## 2012-06-15 DIAGNOSIS — J301 Allergic rhinitis due to pollen: Secondary | ICD-10-CM | POA: Diagnosis not present

## 2012-06-16 DIAGNOSIS — J301 Allergic rhinitis due to pollen: Secondary | ICD-10-CM | POA: Diagnosis not present

## 2012-06-20 DIAGNOSIS — F411 Generalized anxiety disorder: Secondary | ICD-10-CM | POA: Diagnosis not present

## 2012-06-20 DIAGNOSIS — K529 Noninfective gastroenteritis and colitis, unspecified: Secondary | ICD-10-CM | POA: Diagnosis not present

## 2012-06-20 DIAGNOSIS — I2510 Atherosclerotic heart disease of native coronary artery without angina pectoris: Secondary | ICD-10-CM | POA: Diagnosis not present

## 2012-06-20 DIAGNOSIS — R188 Other ascites: Secondary | ICD-10-CM | POA: Diagnosis not present

## 2012-06-20 DIAGNOSIS — I10 Essential (primary) hypertension: Secondary | ICD-10-CM | POA: Diagnosis not present

## 2012-06-20 DIAGNOSIS — B20 Human immunodeficiency virus [HIV] disease: Secondary | ICD-10-CM | POA: Diagnosis not present

## 2012-06-22 DIAGNOSIS — J301 Allergic rhinitis due to pollen: Secondary | ICD-10-CM | POA: Diagnosis not present

## 2012-07-12 DIAGNOSIS — J328 Other chronic sinusitis: Secondary | ICD-10-CM | POA: Diagnosis not present

## 2012-07-12 DIAGNOSIS — J301 Allergic rhinitis due to pollen: Secondary | ICD-10-CM | POA: Diagnosis not present

## 2012-07-13 DIAGNOSIS — J301 Allergic rhinitis due to pollen: Secondary | ICD-10-CM | POA: Diagnosis not present

## 2012-07-17 DIAGNOSIS — K759 Inflammatory liver disease, unspecified: Secondary | ICD-10-CM | POA: Diagnosis not present

## 2012-07-20 DIAGNOSIS — J301 Allergic rhinitis due to pollen: Secondary | ICD-10-CM | POA: Diagnosis not present

## 2012-07-27 DIAGNOSIS — I10 Essential (primary) hypertension: Secondary | ICD-10-CM | POA: Diagnosis not present

## 2012-07-27 DIAGNOSIS — I2510 Atherosclerotic heart disease of native coronary artery without angina pectoris: Secondary | ICD-10-CM | POA: Diagnosis not present

## 2012-07-27 DIAGNOSIS — R079 Chest pain, unspecified: Secondary | ICD-10-CM | POA: Diagnosis not present

## 2012-07-27 DIAGNOSIS — J449 Chronic obstructive pulmonary disease, unspecified: Secondary | ICD-10-CM | POA: Diagnosis not present

## 2012-08-03 DIAGNOSIS — J301 Allergic rhinitis due to pollen: Secondary | ICD-10-CM | POA: Diagnosis not present

## 2012-08-10 DIAGNOSIS — J301 Allergic rhinitis due to pollen: Secondary | ICD-10-CM | POA: Diagnosis not present

## 2012-08-17 DIAGNOSIS — J301 Allergic rhinitis due to pollen: Secondary | ICD-10-CM | POA: Diagnosis not present

## 2012-08-22 DIAGNOSIS — R0602 Shortness of breath: Secondary | ICD-10-CM | POA: Diagnosis not present

## 2012-08-22 DIAGNOSIS — J45909 Unspecified asthma, uncomplicated: Secondary | ICD-10-CM | POA: Diagnosis not present

## 2012-08-22 DIAGNOSIS — E119 Type 2 diabetes mellitus without complications: Secondary | ICD-10-CM | POA: Diagnosis not present

## 2012-08-22 DIAGNOSIS — I10 Essential (primary) hypertension: Secondary | ICD-10-CM | POA: Diagnosis not present

## 2012-08-22 DIAGNOSIS — B20 Human immunodeficiency virus [HIV] disease: Secondary | ICD-10-CM | POA: Diagnosis not present

## 2012-08-22 DIAGNOSIS — E785 Hyperlipidemia, unspecified: Secondary | ICD-10-CM | POA: Diagnosis not present

## 2012-08-22 DIAGNOSIS — R5383 Other fatigue: Secondary | ICD-10-CM | POA: Diagnosis not present

## 2012-08-24 DIAGNOSIS — J301 Allergic rhinitis due to pollen: Secondary | ICD-10-CM | POA: Diagnosis not present

## 2012-08-31 DIAGNOSIS — J301 Allergic rhinitis due to pollen: Secondary | ICD-10-CM | POA: Diagnosis not present

## 2012-09-08 DIAGNOSIS — J301 Allergic rhinitis due to pollen: Secondary | ICD-10-CM | POA: Diagnosis not present

## 2012-09-13 DIAGNOSIS — N182 Chronic kidney disease, stage 2 (mild): Secondary | ICD-10-CM | POA: Diagnosis not present

## 2012-09-13 DIAGNOSIS — I10 Essential (primary) hypertension: Secondary | ICD-10-CM | POA: Diagnosis not present

## 2012-09-14 DIAGNOSIS — J301 Allergic rhinitis due to pollen: Secondary | ICD-10-CM | POA: Diagnosis not present

## 2012-09-18 DIAGNOSIS — J301 Allergic rhinitis due to pollen: Secondary | ICD-10-CM | POA: Diagnosis not present

## 2012-09-28 DIAGNOSIS — J301 Allergic rhinitis due to pollen: Secondary | ICD-10-CM | POA: Diagnosis not present

## 2012-09-29 DIAGNOSIS — I10 Essential (primary) hypertension: Secondary | ICD-10-CM | POA: Diagnosis not present

## 2012-09-29 DIAGNOSIS — E785 Hyperlipidemia, unspecified: Secondary | ICD-10-CM | POA: Diagnosis not present

## 2012-10-04 DIAGNOSIS — J449 Chronic obstructive pulmonary disease, unspecified: Secondary | ICD-10-CM | POA: Diagnosis not present

## 2012-10-05 DIAGNOSIS — J301 Allergic rhinitis due to pollen: Secondary | ICD-10-CM | POA: Diagnosis not present

## 2012-10-12 DIAGNOSIS — J301 Allergic rhinitis due to pollen: Secondary | ICD-10-CM | POA: Diagnosis not present

## 2012-10-16 DIAGNOSIS — R072 Precordial pain: Secondary | ICD-10-CM | POA: Diagnosis not present

## 2012-10-23 DIAGNOSIS — R079 Chest pain, unspecified: Secondary | ICD-10-CM | POA: Diagnosis not present

## 2012-11-02 DIAGNOSIS — J301 Allergic rhinitis due to pollen: Secondary | ICD-10-CM | POA: Diagnosis not present

## 2012-11-16 DIAGNOSIS — J301 Allergic rhinitis due to pollen: Secondary | ICD-10-CM | POA: Diagnosis not present

## 2012-11-20 DIAGNOSIS — I10 Essential (primary) hypertension: Secondary | ICD-10-CM | POA: Diagnosis not present

## 2012-11-20 DIAGNOSIS — G4730 Sleep apnea, unspecified: Secondary | ICD-10-CM | POA: Diagnosis not present

## 2012-11-20 DIAGNOSIS — E782 Mixed hyperlipidemia: Secondary | ICD-10-CM | POA: Diagnosis not present

## 2012-11-20 DIAGNOSIS — J45909 Unspecified asthma, uncomplicated: Secondary | ICD-10-CM | POA: Diagnosis not present

## 2012-11-20 DIAGNOSIS — K219 Gastro-esophageal reflux disease without esophagitis: Secondary | ICD-10-CM | POA: Diagnosis not present

## 2012-11-20 DIAGNOSIS — G4710 Hypersomnia, unspecified: Secondary | ICD-10-CM | POA: Diagnosis not present

## 2012-11-20 DIAGNOSIS — E119 Type 2 diabetes mellitus without complications: Secondary | ICD-10-CM | POA: Diagnosis not present

## 2012-11-21 DIAGNOSIS — J301 Allergic rhinitis due to pollen: Secondary | ICD-10-CM | POA: Diagnosis not present

## 2012-11-22 DIAGNOSIS — R0602 Shortness of breath: Secondary | ICD-10-CM | POA: Diagnosis not present

## 2012-11-23 DIAGNOSIS — I2510 Atherosclerotic heart disease of native coronary artery without angina pectoris: Secondary | ICD-10-CM | POA: Diagnosis not present

## 2012-11-23 DIAGNOSIS — I10 Essential (primary) hypertension: Secondary | ICD-10-CM | POA: Diagnosis not present

## 2012-11-23 DIAGNOSIS — K219 Gastro-esophageal reflux disease without esophagitis: Secondary | ICD-10-CM | POA: Diagnosis not present

## 2012-11-23 DIAGNOSIS — E785 Hyperlipidemia, unspecified: Secondary | ICD-10-CM | POA: Diagnosis not present

## 2012-11-30 DIAGNOSIS — J301 Allergic rhinitis due to pollen: Secondary | ICD-10-CM | POA: Diagnosis not present

## 2012-12-07 DIAGNOSIS — J301 Allergic rhinitis due to pollen: Secondary | ICD-10-CM | POA: Diagnosis not present

## 2012-12-14 DIAGNOSIS — J301 Allergic rhinitis due to pollen: Secondary | ICD-10-CM | POA: Diagnosis not present

## 2012-12-21 DIAGNOSIS — J301 Allergic rhinitis due to pollen: Secondary | ICD-10-CM | POA: Diagnosis not present

## 2012-12-28 DIAGNOSIS — J301 Allergic rhinitis due to pollen: Secondary | ICD-10-CM | POA: Diagnosis not present

## 2013-01-04 DIAGNOSIS — J301 Allergic rhinitis due to pollen: Secondary | ICD-10-CM | POA: Diagnosis not present

## 2013-01-08 DIAGNOSIS — J301 Allergic rhinitis due to pollen: Secondary | ICD-10-CM | POA: Diagnosis not present

## 2013-01-11 DIAGNOSIS — E7800 Pure hypercholesterolemia, unspecified: Secondary | ICD-10-CM | POA: Diagnosis not present

## 2013-01-11 DIAGNOSIS — E119 Type 2 diabetes mellitus without complications: Secondary | ICD-10-CM | POA: Diagnosis not present

## 2013-01-11 DIAGNOSIS — J449 Chronic obstructive pulmonary disease, unspecified: Secondary | ICD-10-CM | POA: Diagnosis not present

## 2013-01-11 DIAGNOSIS — Z21 Asymptomatic human immunodeficiency virus [HIV] infection status: Secondary | ICD-10-CM | POA: Diagnosis not present

## 2013-01-17 DIAGNOSIS — L219 Seborrheic dermatitis, unspecified: Secondary | ICD-10-CM | POA: Diagnosis not present

## 2013-01-17 DIAGNOSIS — R21 Rash and other nonspecific skin eruption: Secondary | ICD-10-CM | POA: Diagnosis not present

## 2013-01-17 DIAGNOSIS — L821 Other seborrheic keratosis: Secondary | ICD-10-CM | POA: Diagnosis not present

## 2013-01-17 DIAGNOSIS — L570 Actinic keratosis: Secondary | ICD-10-CM | POA: Diagnosis not present

## 2013-01-18 DIAGNOSIS — J301 Allergic rhinitis due to pollen: Secondary | ICD-10-CM | POA: Diagnosis not present

## 2013-01-23 DIAGNOSIS — I10 Essential (primary) hypertension: Secondary | ICD-10-CM | POA: Diagnosis not present

## 2013-01-23 DIAGNOSIS — I2510 Atherosclerotic heart disease of native coronary artery without angina pectoris: Secondary | ICD-10-CM | POA: Diagnosis not present

## 2013-01-23 DIAGNOSIS — R079 Chest pain, unspecified: Secondary | ICD-10-CM | POA: Diagnosis not present

## 2013-01-24 DIAGNOSIS — J438 Other emphysema: Secondary | ICD-10-CM | POA: Diagnosis not present

## 2013-01-24 DIAGNOSIS — I10 Essential (primary) hypertension: Secondary | ICD-10-CM | POA: Diagnosis not present

## 2013-01-24 DIAGNOSIS — J449 Chronic obstructive pulmonary disease, unspecified: Secondary | ICD-10-CM | POA: Diagnosis not present

## 2013-01-24 DIAGNOSIS — I2510 Atherosclerotic heart disease of native coronary artery without angina pectoris: Secondary | ICD-10-CM | POA: Diagnosis not present

## 2013-01-25 DIAGNOSIS — J301 Allergic rhinitis due to pollen: Secondary | ICD-10-CM | POA: Diagnosis not present

## 2013-02-01 DIAGNOSIS — J301 Allergic rhinitis due to pollen: Secondary | ICD-10-CM | POA: Diagnosis not present

## 2013-02-13 DIAGNOSIS — J301 Allergic rhinitis due to pollen: Secondary | ICD-10-CM | POA: Diagnosis not present

## 2013-02-13 DIAGNOSIS — R109 Unspecified abdominal pain: Secondary | ICD-10-CM | POA: Diagnosis not present

## 2013-02-13 DIAGNOSIS — K5900 Constipation, unspecified: Secondary | ICD-10-CM | POA: Diagnosis not present

## 2013-02-13 DIAGNOSIS — R143 Flatulence: Secondary | ICD-10-CM | POA: Diagnosis not present

## 2013-02-13 DIAGNOSIS — K219 Gastro-esophageal reflux disease without esophagitis: Secondary | ICD-10-CM | POA: Diagnosis not present

## 2013-02-13 DIAGNOSIS — R141 Gas pain: Secondary | ICD-10-CM | POA: Diagnosis not present

## 2013-02-15 DIAGNOSIS — J301 Allergic rhinitis due to pollen: Secondary | ICD-10-CM | POA: Diagnosis not present

## 2013-02-19 DIAGNOSIS — J309 Allergic rhinitis, unspecified: Secondary | ICD-10-CM | POA: Diagnosis not present

## 2013-02-19 DIAGNOSIS — I10 Essential (primary) hypertension: Secondary | ICD-10-CM | POA: Diagnosis not present

## 2013-02-19 DIAGNOSIS — J45909 Unspecified asthma, uncomplicated: Secondary | ICD-10-CM | POA: Diagnosis not present

## 2013-02-19 DIAGNOSIS — R5383 Other fatigue: Secondary | ICD-10-CM | POA: Diagnosis not present

## 2013-02-19 DIAGNOSIS — F411 Generalized anxiety disorder: Secondary | ICD-10-CM | POA: Diagnosis not present

## 2013-02-19 DIAGNOSIS — R0602 Shortness of breath: Secondary | ICD-10-CM | POA: Diagnosis not present

## 2013-02-19 DIAGNOSIS — I2510 Atherosclerotic heart disease of native coronary artery without angina pectoris: Secondary | ICD-10-CM | POA: Diagnosis not present

## 2013-02-19 DIAGNOSIS — F329 Major depressive disorder, single episode, unspecified: Secondary | ICD-10-CM | POA: Diagnosis not present

## 2013-02-22 DIAGNOSIS — J301 Allergic rhinitis due to pollen: Secondary | ICD-10-CM | POA: Diagnosis not present

## 2013-02-26 DIAGNOSIS — R5381 Other malaise: Secondary | ICD-10-CM | POA: Diagnosis not present

## 2013-02-26 DIAGNOSIS — R0602 Shortness of breath: Secondary | ICD-10-CM | POA: Diagnosis not present

## 2013-02-26 DIAGNOSIS — D518 Other vitamin B12 deficiency anemias: Secondary | ICD-10-CM | POA: Diagnosis not present

## 2013-02-26 DIAGNOSIS — R5383 Other fatigue: Secondary | ICD-10-CM | POA: Diagnosis not present

## 2013-02-26 DIAGNOSIS — E119 Type 2 diabetes mellitus without complications: Secondary | ICD-10-CM | POA: Diagnosis not present

## 2013-02-26 DIAGNOSIS — D649 Anemia, unspecified: Secondary | ICD-10-CM | POA: Diagnosis not present

## 2013-03-08 DIAGNOSIS — J301 Allergic rhinitis due to pollen: Secondary | ICD-10-CM | POA: Diagnosis not present

## 2013-03-15 DIAGNOSIS — J301 Allergic rhinitis due to pollen: Secondary | ICD-10-CM | POA: Diagnosis not present

## 2013-03-16 DIAGNOSIS — I10 Essential (primary) hypertension: Secondary | ICD-10-CM | POA: Diagnosis not present

## 2013-03-16 DIAGNOSIS — Z79899 Other long term (current) drug therapy: Secondary | ICD-10-CM | POA: Diagnosis not present

## 2013-03-16 DIAGNOSIS — E785 Hyperlipidemia, unspecified: Secondary | ICD-10-CM | POA: Diagnosis not present

## 2013-03-16 DIAGNOSIS — R9430 Abnormal result of cardiovascular function study, unspecified: Secondary | ICD-10-CM | POA: Diagnosis not present

## 2013-03-20 DIAGNOSIS — I8000 Phlebitis and thrombophlebitis of superficial vessels of unspecified lower extremity: Secondary | ICD-10-CM | POA: Diagnosis not present

## 2013-03-20 DIAGNOSIS — I2510 Atherosclerotic heart disease of native coronary artery without angina pectoris: Secondary | ICD-10-CM | POA: Diagnosis not present

## 2013-03-20 DIAGNOSIS — I10 Essential (primary) hypertension: Secondary | ICD-10-CM | POA: Diagnosis not present

## 2013-03-20 DIAGNOSIS — K219 Gastro-esophageal reflux disease without esophagitis: Secondary | ICD-10-CM | POA: Diagnosis not present

## 2013-03-21 DIAGNOSIS — L219 Seborrheic dermatitis, unspecified: Secondary | ICD-10-CM | POA: Diagnosis not present

## 2013-03-21 DIAGNOSIS — L820 Inflamed seborrheic keratosis: Secondary | ICD-10-CM | POA: Diagnosis not present

## 2013-03-28 DIAGNOSIS — J301 Allergic rhinitis due to pollen: Secondary | ICD-10-CM | POA: Diagnosis not present

## 2013-04-04 ENCOUNTER — Emergency Department: Payer: Self-pay | Admitting: Emergency Medicine

## 2013-04-04 DIAGNOSIS — J301 Allergic rhinitis due to pollen: Secondary | ICD-10-CM | POA: Diagnosis not present

## 2013-04-04 DIAGNOSIS — I252 Old myocardial infarction: Secondary | ICD-10-CM | POA: Diagnosis not present

## 2013-04-04 DIAGNOSIS — L989 Disorder of the skin and subcutaneous tissue, unspecified: Secondary | ICD-10-CM | POA: Diagnosis not present

## 2013-04-04 DIAGNOSIS — Z79899 Other long term (current) drug therapy: Secondary | ICD-10-CM | POA: Diagnosis not present

## 2013-04-04 DIAGNOSIS — Z21 Asymptomatic human immunodeficiency virus [HIV] infection status: Secondary | ICD-10-CM | POA: Diagnosis not present

## 2013-04-04 DIAGNOSIS — J449 Chronic obstructive pulmonary disease, unspecified: Secondary | ICD-10-CM | POA: Diagnosis not present

## 2013-04-04 DIAGNOSIS — I10 Essential (primary) hypertension: Secondary | ICD-10-CM | POA: Diagnosis not present

## 2013-04-04 DIAGNOSIS — R609 Edema, unspecified: Secondary | ICD-10-CM | POA: Diagnosis not present

## 2013-04-04 DIAGNOSIS — E119 Type 2 diabetes mellitus without complications: Secondary | ICD-10-CM | POA: Diagnosis not present

## 2013-04-04 DIAGNOSIS — F17200 Nicotine dependence, unspecified, uncomplicated: Secondary | ICD-10-CM | POA: Diagnosis not present

## 2013-04-10 DIAGNOSIS — D518 Other vitamin B12 deficiency anemias: Secondary | ICD-10-CM | POA: Diagnosis not present

## 2013-04-17 DIAGNOSIS — D518 Other vitamin B12 deficiency anemias: Secondary | ICD-10-CM | POA: Diagnosis not present

## 2013-04-18 DIAGNOSIS — B20 Human immunodeficiency virus [HIV] disease: Secondary | ICD-10-CM | POA: Diagnosis not present

## 2013-04-24 DIAGNOSIS — D518 Other vitamin B12 deficiency anemias: Secondary | ICD-10-CM | POA: Diagnosis not present

## 2013-04-25 DIAGNOSIS — J301 Allergic rhinitis due to pollen: Secondary | ICD-10-CM | POA: Diagnosis not present

## 2013-04-26 DIAGNOSIS — J438 Other emphysema: Secondary | ICD-10-CM | POA: Diagnosis not present

## 2013-04-26 DIAGNOSIS — Z23 Encounter for immunization: Secondary | ICD-10-CM | POA: Diagnosis not present

## 2013-04-26 DIAGNOSIS — J449 Chronic obstructive pulmonary disease, unspecified: Secondary | ICD-10-CM | POA: Diagnosis not present

## 2013-05-01 DIAGNOSIS — D518 Other vitamin B12 deficiency anemias: Secondary | ICD-10-CM | POA: Diagnosis not present

## 2013-05-02 DIAGNOSIS — J301 Allergic rhinitis due to pollen: Secondary | ICD-10-CM | POA: Diagnosis not present

## 2013-05-08 DIAGNOSIS — J301 Allergic rhinitis due to pollen: Secondary | ICD-10-CM | POA: Diagnosis not present

## 2013-05-09 DIAGNOSIS — J301 Allergic rhinitis due to pollen: Secondary | ICD-10-CM | POA: Diagnosis not present

## 2013-05-15 DIAGNOSIS — N390 Urinary tract infection, site not specified: Secondary | ICD-10-CM | POA: Diagnosis not present

## 2013-05-15 DIAGNOSIS — J45909 Unspecified asthma, uncomplicated: Secondary | ICD-10-CM | POA: Diagnosis not present

## 2013-05-15 DIAGNOSIS — I10 Essential (primary) hypertension: Secondary | ICD-10-CM | POA: Diagnosis not present

## 2013-05-15 DIAGNOSIS — R109 Unspecified abdominal pain: Secondary | ICD-10-CM | POA: Diagnosis not present

## 2013-05-15 DIAGNOSIS — R5381 Other malaise: Secondary | ICD-10-CM | POA: Diagnosis not present

## 2013-05-15 DIAGNOSIS — K219 Gastro-esophageal reflux disease without esophagitis: Secondary | ICD-10-CM | POA: Diagnosis not present

## 2013-05-15 DIAGNOSIS — E119 Type 2 diabetes mellitus without complications: Secondary | ICD-10-CM | POA: Diagnosis not present

## 2013-05-16 DIAGNOSIS — J301 Allergic rhinitis due to pollen: Secondary | ICD-10-CM | POA: Diagnosis not present

## 2013-05-29 DIAGNOSIS — R5381 Other malaise: Secondary | ICD-10-CM | POA: Diagnosis not present

## 2013-05-29 DIAGNOSIS — D518 Other vitamin B12 deficiency anemias: Secondary | ICD-10-CM | POA: Diagnosis not present

## 2013-05-29 DIAGNOSIS — J449 Chronic obstructive pulmonary disease, unspecified: Secondary | ICD-10-CM | POA: Diagnosis not present

## 2013-05-29 DIAGNOSIS — E782 Mixed hyperlipidemia: Secondary | ICD-10-CM | POA: Diagnosis not present

## 2013-05-29 DIAGNOSIS — I10 Essential (primary) hypertension: Secondary | ICD-10-CM | POA: Diagnosis not present

## 2013-05-29 DIAGNOSIS — F411 Generalized anxiety disorder: Secondary | ICD-10-CM | POA: Diagnosis not present

## 2013-05-29 DIAGNOSIS — J45909 Unspecified asthma, uncomplicated: Secondary | ICD-10-CM | POA: Diagnosis not present

## 2013-05-29 DIAGNOSIS — E119 Type 2 diabetes mellitus without complications: Secondary | ICD-10-CM | POA: Diagnosis not present

## 2013-05-30 DIAGNOSIS — J301 Allergic rhinitis due to pollen: Secondary | ICD-10-CM | POA: Diagnosis not present

## 2013-06-13 DIAGNOSIS — J301 Allergic rhinitis due to pollen: Secondary | ICD-10-CM | POA: Diagnosis not present

## 2013-06-20 DIAGNOSIS — J301 Allergic rhinitis due to pollen: Secondary | ICD-10-CM | POA: Diagnosis not present

## 2013-06-27 DIAGNOSIS — J301 Allergic rhinitis due to pollen: Secondary | ICD-10-CM | POA: Diagnosis not present

## 2013-07-03 DIAGNOSIS — D518 Other vitamin B12 deficiency anemias: Secondary | ICD-10-CM | POA: Diagnosis not present

## 2013-07-04 DIAGNOSIS — J301 Allergic rhinitis due to pollen: Secondary | ICD-10-CM | POA: Diagnosis not present

## 2013-07-23 DIAGNOSIS — J328 Other chronic sinusitis: Secondary | ICD-10-CM | POA: Diagnosis not present

## 2013-07-23 DIAGNOSIS — J301 Allergic rhinitis due to pollen: Secondary | ICD-10-CM | POA: Diagnosis not present

## 2013-07-25 DIAGNOSIS — J301 Allergic rhinitis due to pollen: Secondary | ICD-10-CM | POA: Diagnosis not present

## 2013-08-01 DIAGNOSIS — J301 Allergic rhinitis due to pollen: Secondary | ICD-10-CM | POA: Diagnosis not present

## 2013-08-08 DIAGNOSIS — J301 Allergic rhinitis due to pollen: Secondary | ICD-10-CM | POA: Diagnosis not present

## 2013-08-15 DIAGNOSIS — J301 Allergic rhinitis due to pollen: Secondary | ICD-10-CM | POA: Diagnosis not present

## 2013-08-16 DIAGNOSIS — I2510 Atherosclerotic heart disease of native coronary artery without angina pectoris: Secondary | ICD-10-CM | POA: Diagnosis not present

## 2013-08-16 DIAGNOSIS — I10 Essential (primary) hypertension: Secondary | ICD-10-CM | POA: Diagnosis not present

## 2013-08-16 DIAGNOSIS — K219 Gastro-esophageal reflux disease without esophagitis: Secondary | ICD-10-CM | POA: Diagnosis not present

## 2013-08-16 DIAGNOSIS — E785 Hyperlipidemia, unspecified: Secondary | ICD-10-CM | POA: Diagnosis not present

## 2013-08-17 DIAGNOSIS — B20 Human immunodeficiency virus [HIV] disease: Secondary | ICD-10-CM | POA: Diagnosis not present

## 2013-08-22 DIAGNOSIS — J301 Allergic rhinitis due to pollen: Secondary | ICD-10-CM | POA: Diagnosis not present

## 2013-08-27 DIAGNOSIS — D518 Other vitamin B12 deficiency anemias: Secondary | ICD-10-CM | POA: Diagnosis not present

## 2013-08-27 DIAGNOSIS — J449 Chronic obstructive pulmonary disease, unspecified: Secondary | ICD-10-CM | POA: Diagnosis not present

## 2013-08-29 DIAGNOSIS — J301 Allergic rhinitis due to pollen: Secondary | ICD-10-CM | POA: Diagnosis not present

## 2013-09-05 ENCOUNTER — Emergency Department: Payer: Self-pay | Admitting: Emergency Medicine

## 2013-09-05 DIAGNOSIS — R059 Cough, unspecified: Secondary | ICD-10-CM | POA: Diagnosis not present

## 2013-09-05 DIAGNOSIS — Z21 Asymptomatic human immunodeficiency virus [HIV] infection status: Secondary | ICD-10-CM | POA: Diagnosis not present

## 2013-09-05 DIAGNOSIS — Z888 Allergy status to other drugs, medicaments and biological substances status: Secondary | ICD-10-CM | POA: Diagnosis not present

## 2013-09-05 DIAGNOSIS — Z882 Allergy status to sulfonamides status: Secondary | ICD-10-CM | POA: Diagnosis not present

## 2013-09-05 DIAGNOSIS — I10 Essential (primary) hypertension: Secondary | ICD-10-CM | POA: Diagnosis not present

## 2013-09-05 DIAGNOSIS — Z87891 Personal history of nicotine dependence: Secondary | ICD-10-CM | POA: Diagnosis not present

## 2013-09-05 DIAGNOSIS — Z9889 Other specified postprocedural states: Secondary | ICD-10-CM | POA: Diagnosis not present

## 2013-09-05 DIAGNOSIS — R05 Cough: Secondary | ICD-10-CM | POA: Diagnosis not present

## 2013-09-05 DIAGNOSIS — J301 Allergic rhinitis due to pollen: Secondary | ICD-10-CM | POA: Diagnosis not present

## 2013-09-05 DIAGNOSIS — J449 Chronic obstructive pulmonary disease, unspecified: Secondary | ICD-10-CM | POA: Diagnosis not present

## 2013-09-05 DIAGNOSIS — J069 Acute upper respiratory infection, unspecified: Secondary | ICD-10-CM | POA: Diagnosis not present

## 2013-09-05 DIAGNOSIS — Z79899 Other long term (current) drug therapy: Secondary | ICD-10-CM | POA: Diagnosis not present

## 2013-09-05 DIAGNOSIS — J029 Acute pharyngitis, unspecified: Secondary | ICD-10-CM | POA: Diagnosis not present

## 2013-09-12 DIAGNOSIS — J301 Allergic rhinitis due to pollen: Secondary | ICD-10-CM | POA: Diagnosis not present

## 2013-09-19 DIAGNOSIS — J301 Allergic rhinitis due to pollen: Secondary | ICD-10-CM | POA: Diagnosis not present

## 2013-09-21 DIAGNOSIS — L219 Seborrheic dermatitis, unspecified: Secondary | ICD-10-CM | POA: Diagnosis not present

## 2013-09-21 DIAGNOSIS — L821 Other seborrheic keratosis: Secondary | ICD-10-CM | POA: Diagnosis not present

## 2013-09-21 DIAGNOSIS — L723 Sebaceous cyst: Secondary | ICD-10-CM | POA: Diagnosis not present

## 2013-09-26 DIAGNOSIS — D518 Other vitamin B12 deficiency anemias: Secondary | ICD-10-CM | POA: Diagnosis not present

## 2013-10-03 DIAGNOSIS — J301 Allergic rhinitis due to pollen: Secondary | ICD-10-CM | POA: Diagnosis not present

## 2013-10-10 DIAGNOSIS — J301 Allergic rhinitis due to pollen: Secondary | ICD-10-CM | POA: Diagnosis not present

## 2013-10-12 DIAGNOSIS — L821 Other seborrheic keratosis: Secondary | ICD-10-CM | POA: Diagnosis not present

## 2013-10-12 DIAGNOSIS — D692 Other nonthrombocytopenic purpura: Secondary | ICD-10-CM | POA: Diagnosis not present

## 2013-10-12 DIAGNOSIS — L723 Sebaceous cyst: Secondary | ICD-10-CM | POA: Diagnosis not present

## 2013-10-17 DIAGNOSIS — J301 Allergic rhinitis due to pollen: Secondary | ICD-10-CM | POA: Diagnosis not present

## 2013-10-24 DIAGNOSIS — J301 Allergic rhinitis due to pollen: Secondary | ICD-10-CM | POA: Diagnosis not present

## 2013-10-25 DIAGNOSIS — D518 Other vitamin B12 deficiency anemias: Secondary | ICD-10-CM | POA: Diagnosis not present

## 2013-10-31 DIAGNOSIS — J301 Allergic rhinitis due to pollen: Secondary | ICD-10-CM | POA: Diagnosis not present

## 2013-11-07 DIAGNOSIS — J301 Allergic rhinitis due to pollen: Secondary | ICD-10-CM | POA: Diagnosis not present

## 2013-11-28 DIAGNOSIS — J301 Allergic rhinitis due to pollen: Secondary | ICD-10-CM | POA: Diagnosis not present

## 2013-11-29 DIAGNOSIS — D518 Other vitamin B12 deficiency anemias: Secondary | ICD-10-CM | POA: Diagnosis not present

## 2013-12-04 DIAGNOSIS — L723 Sebaceous cyst: Secondary | ICD-10-CM | POA: Diagnosis not present

## 2013-12-04 DIAGNOSIS — L821 Other seborrheic keratosis: Secondary | ICD-10-CM | POA: Diagnosis not present

## 2013-12-04 DIAGNOSIS — L578 Other skin changes due to chronic exposure to nonionizing radiation: Secondary | ICD-10-CM | POA: Diagnosis not present

## 2013-12-04 DIAGNOSIS — L820 Inflamed seborrheic keratosis: Secondary | ICD-10-CM | POA: Diagnosis not present

## 2013-12-05 DIAGNOSIS — J301 Allergic rhinitis due to pollen: Secondary | ICD-10-CM | POA: Diagnosis not present

## 2013-12-12 DIAGNOSIS — J301 Allergic rhinitis due to pollen: Secondary | ICD-10-CM | POA: Diagnosis not present

## 2013-12-13 DIAGNOSIS — H2510 Age-related nuclear cataract, unspecified eye: Secondary | ICD-10-CM | POA: Diagnosis not present

## 2013-12-14 DIAGNOSIS — I059 Rheumatic mitral valve disease, unspecified: Secondary | ICD-10-CM | POA: Diagnosis not present

## 2013-12-14 DIAGNOSIS — J449 Chronic obstructive pulmonary disease, unspecified: Secondary | ICD-10-CM | POA: Diagnosis not present

## 2013-12-14 DIAGNOSIS — I10 Essential (primary) hypertension: Secondary | ICD-10-CM | POA: Diagnosis not present

## 2013-12-14 DIAGNOSIS — R079 Chest pain, unspecified: Secondary | ICD-10-CM | POA: Diagnosis not present

## 2013-12-14 DIAGNOSIS — I2510 Atherosclerotic heart disease of native coronary artery without angina pectoris: Secondary | ICD-10-CM | POA: Diagnosis not present

## 2013-12-14 DIAGNOSIS — K219 Gastro-esophageal reflux disease without esophagitis: Secondary | ICD-10-CM | POA: Diagnosis not present

## 2013-12-14 DIAGNOSIS — E785 Hyperlipidemia, unspecified: Secondary | ICD-10-CM | POA: Diagnosis not present

## 2013-12-19 DIAGNOSIS — J321 Chronic frontal sinusitis: Secondary | ICD-10-CM | POA: Diagnosis not present

## 2013-12-19 DIAGNOSIS — J323 Chronic sphenoidal sinusitis: Secondary | ICD-10-CM | POA: Diagnosis not present

## 2013-12-19 DIAGNOSIS — J301 Allergic rhinitis due to pollen: Secondary | ICD-10-CM | POA: Diagnosis not present

## 2013-12-19 DIAGNOSIS — J320 Chronic maxillary sinusitis: Secondary | ICD-10-CM | POA: Diagnosis not present

## 2013-12-19 DIAGNOSIS — J322 Chronic ethmoidal sinusitis: Secondary | ICD-10-CM | POA: Diagnosis not present

## 2013-12-24 DIAGNOSIS — D518 Other vitamin B12 deficiency anemias: Secondary | ICD-10-CM | POA: Diagnosis not present

## 2013-12-24 DIAGNOSIS — J449 Chronic obstructive pulmonary disease, unspecified: Secondary | ICD-10-CM | POA: Diagnosis not present

## 2013-12-24 DIAGNOSIS — L03039 Cellulitis of unspecified toe: Secondary | ICD-10-CM | POA: Diagnosis not present

## 2013-12-27 DIAGNOSIS — E119 Type 2 diabetes mellitus without complications: Secondary | ICD-10-CM | POA: Diagnosis not present

## 2013-12-27 DIAGNOSIS — E038 Other specified hypothyroidism: Secondary | ICD-10-CM | POA: Diagnosis not present

## 2013-12-27 DIAGNOSIS — D518 Other vitamin B12 deficiency anemias: Secondary | ICD-10-CM | POA: Diagnosis not present

## 2013-12-27 DIAGNOSIS — I10 Essential (primary) hypertension: Secondary | ICD-10-CM | POA: Diagnosis not present

## 2014-01-10 DIAGNOSIS — R072 Precordial pain: Secondary | ICD-10-CM | POA: Diagnosis not present

## 2014-01-15 DIAGNOSIS — R945 Abnormal results of liver function studies: Secondary | ICD-10-CM | POA: Diagnosis not present

## 2014-01-17 DIAGNOSIS — I10 Essential (primary) hypertension: Secondary | ICD-10-CM | POA: Diagnosis not present

## 2014-01-17 DIAGNOSIS — E785 Hyperlipidemia, unspecified: Secondary | ICD-10-CM | POA: Diagnosis not present

## 2014-01-17 DIAGNOSIS — I2510 Atherosclerotic heart disease of native coronary artery without angina pectoris: Secondary | ICD-10-CM | POA: Diagnosis not present

## 2014-01-17 DIAGNOSIS — K219 Gastro-esophageal reflux disease without esophagitis: Secondary | ICD-10-CM | POA: Diagnosis not present

## 2014-01-17 DIAGNOSIS — I059 Rheumatic mitral valve disease, unspecified: Secondary | ICD-10-CM | POA: Diagnosis not present

## 2014-01-18 DIAGNOSIS — I10 Essential (primary) hypertension: Secondary | ICD-10-CM | POA: Diagnosis not present

## 2014-01-18 DIAGNOSIS — R809 Proteinuria, unspecified: Secondary | ICD-10-CM | POA: Diagnosis not present

## 2014-01-18 DIAGNOSIS — N182 Chronic kidney disease, stage 2 (mild): Secondary | ICD-10-CM | POA: Diagnosis not present

## 2014-01-24 DIAGNOSIS — D692 Other nonthrombocytopenic purpura: Secondary | ICD-10-CM | POA: Diagnosis not present

## 2014-01-24 DIAGNOSIS — L723 Sebaceous cyst: Secondary | ICD-10-CM | POA: Diagnosis not present

## 2014-01-24 DIAGNOSIS — L570 Actinic keratosis: Secondary | ICD-10-CM | POA: Diagnosis not present

## 2014-01-24 DIAGNOSIS — L821 Other seborrheic keratosis: Secondary | ICD-10-CM | POA: Diagnosis not present

## 2014-02-11 DIAGNOSIS — B20 Human immunodeficiency virus [HIV] disease: Secondary | ICD-10-CM | POA: Diagnosis not present

## 2014-02-11 DIAGNOSIS — I10 Essential (primary) hypertension: Secondary | ICD-10-CM | POA: Diagnosis not present

## 2014-02-11 DIAGNOSIS — Z21 Asymptomatic human immunodeficiency virus [HIV] infection status: Secondary | ICD-10-CM | POA: Diagnosis not present

## 2014-03-14 DIAGNOSIS — R5383 Other fatigue: Secondary | ICD-10-CM | POA: Diagnosis not present

## 2014-03-14 DIAGNOSIS — N390 Urinary tract infection, site not specified: Secondary | ICD-10-CM | POA: Diagnosis not present

## 2014-03-14 DIAGNOSIS — Z23 Encounter for immunization: Secondary | ICD-10-CM | POA: Diagnosis not present

## 2014-03-14 DIAGNOSIS — R197 Diarrhea, unspecified: Secondary | ICD-10-CM | POA: Diagnosis not present

## 2014-03-14 DIAGNOSIS — R5381 Other malaise: Secondary | ICD-10-CM | POA: Diagnosis not present

## 2014-03-14 DIAGNOSIS — J45909 Unspecified asthma, uncomplicated: Secondary | ICD-10-CM | POA: Diagnosis not present

## 2014-03-14 DIAGNOSIS — I10 Essential (primary) hypertension: Secondary | ICD-10-CM | POA: Diagnosis not present

## 2014-03-14 DIAGNOSIS — B20 Human immunodeficiency virus [HIV] disease: Secondary | ICD-10-CM | POA: Diagnosis not present

## 2014-03-14 DIAGNOSIS — K219 Gastro-esophageal reflux disease without esophagitis: Secondary | ICD-10-CM | POA: Diagnosis not present

## 2014-03-20 DIAGNOSIS — R197 Diarrhea, unspecified: Secondary | ICD-10-CM | POA: Diagnosis not present

## 2014-04-01 DIAGNOSIS — E782 Mixed hyperlipidemia: Secondary | ICD-10-CM | POA: Diagnosis not present

## 2014-04-01 DIAGNOSIS — E119 Type 2 diabetes mellitus without complications: Secondary | ICD-10-CM | POA: Diagnosis not present

## 2014-04-01 DIAGNOSIS — R300 Dysuria: Secondary | ICD-10-CM | POA: Diagnosis not present

## 2014-04-01 DIAGNOSIS — R197 Diarrhea, unspecified: Secondary | ICD-10-CM | POA: Diagnosis not present

## 2014-04-01 DIAGNOSIS — R109 Unspecified abdominal pain: Secondary | ICD-10-CM | POA: Diagnosis not present

## 2014-04-01 DIAGNOSIS — F411 Generalized anxiety disorder: Secondary | ICD-10-CM | POA: Diagnosis not present

## 2014-04-01 DIAGNOSIS — B20 Human immunodeficiency virus [HIV] disease: Secondary | ICD-10-CM | POA: Diagnosis not present

## 2014-04-01 DIAGNOSIS — N390 Urinary tract infection, site not specified: Secondary | ICD-10-CM | POA: Diagnosis not present

## 2014-04-09 DIAGNOSIS — Z86010 Personal history of colonic polyps: Secondary | ICD-10-CM | POA: Diagnosis not present

## 2014-04-25 DIAGNOSIS — L570 Actinic keratosis: Secondary | ICD-10-CM | POA: Diagnosis not present

## 2014-04-25 DIAGNOSIS — L820 Inflamed seborrheic keratosis: Secondary | ICD-10-CM | POA: Diagnosis not present

## 2014-04-25 DIAGNOSIS — L219 Seborrheic dermatitis, unspecified: Secondary | ICD-10-CM | POA: Diagnosis not present

## 2014-04-25 DIAGNOSIS — L905 Scar conditions and fibrosis of skin: Secondary | ICD-10-CM | POA: Diagnosis not present

## 2014-04-25 DIAGNOSIS — L578 Other skin changes due to chronic exposure to nonionizing radiation: Secondary | ICD-10-CM | POA: Diagnosis not present

## 2014-05-01 DIAGNOSIS — J309 Allergic rhinitis, unspecified: Secondary | ICD-10-CM | POA: Diagnosis not present

## 2014-05-01 DIAGNOSIS — J449 Chronic obstructive pulmonary disease, unspecified: Secondary | ICD-10-CM | POA: Diagnosis not present

## 2014-05-01 DIAGNOSIS — J438 Other emphysema: Secondary | ICD-10-CM | POA: Diagnosis not present

## 2014-05-09 DIAGNOSIS — E785 Hyperlipidemia, unspecified: Secondary | ICD-10-CM | POA: Diagnosis not present

## 2014-05-09 DIAGNOSIS — I2510 Atherosclerotic heart disease of native coronary artery without angina pectoris: Secondary | ICD-10-CM | POA: Diagnosis not present

## 2014-05-09 DIAGNOSIS — K219 Gastro-esophageal reflux disease without esophagitis: Secondary | ICD-10-CM | POA: Diagnosis not present

## 2014-05-09 DIAGNOSIS — I10 Essential (primary) hypertension: Secondary | ICD-10-CM | POA: Diagnosis not present

## 2014-05-17 ENCOUNTER — Ambulatory Visit: Payer: Self-pay | Admitting: Unknown Physician Specialty

## 2014-05-17 DIAGNOSIS — Z86718 Personal history of other venous thrombosis and embolism: Secondary | ICD-10-CM | POA: Diagnosis not present

## 2014-05-17 DIAGNOSIS — I2510 Atherosclerotic heart disease of native coronary artery without angina pectoris: Secondary | ICD-10-CM | POA: Diagnosis not present

## 2014-05-17 DIAGNOSIS — Z86010 Personal history of colonic polyps: Secondary | ICD-10-CM | POA: Diagnosis not present

## 2014-05-17 DIAGNOSIS — Z881 Allergy status to other antibiotic agents status: Secondary | ICD-10-CM | POA: Diagnosis not present

## 2014-05-17 DIAGNOSIS — R197 Diarrhea, unspecified: Secondary | ICD-10-CM | POA: Diagnosis not present

## 2014-05-17 DIAGNOSIS — I252 Old myocardial infarction: Secondary | ICD-10-CM | POA: Diagnosis not present

## 2014-05-17 DIAGNOSIS — K648 Other hemorrhoids: Secondary | ICD-10-CM | POA: Diagnosis not present

## 2014-05-17 DIAGNOSIS — K649 Unspecified hemorrhoids: Secondary | ICD-10-CM | POA: Diagnosis not present

## 2014-05-17 DIAGNOSIS — M1990 Unspecified osteoarthritis, unspecified site: Secondary | ICD-10-CM | POA: Diagnosis not present

## 2014-05-17 DIAGNOSIS — Z882 Allergy status to sulfonamides status: Secondary | ICD-10-CM | POA: Diagnosis not present

## 2014-05-17 DIAGNOSIS — I10 Essential (primary) hypertension: Secondary | ICD-10-CM | POA: Diagnosis not present

## 2014-05-17 DIAGNOSIS — F329 Major depressive disorder, single episode, unspecified: Secondary | ICD-10-CM | POA: Diagnosis not present

## 2014-05-17 DIAGNOSIS — Z9109 Other allergy status, other than to drugs and biological substances: Secondary | ICD-10-CM | POA: Diagnosis not present

## 2014-05-17 DIAGNOSIS — Z886 Allergy status to analgesic agent status: Secondary | ICD-10-CM | POA: Diagnosis not present

## 2014-05-17 DIAGNOSIS — K5730 Diverticulosis of large intestine without perforation or abscess without bleeding: Secondary | ICD-10-CM | POA: Diagnosis not present

## 2014-05-20 LAB — PATHOLOGY REPORT

## 2014-06-12 DIAGNOSIS — J339 Nasal polyp, unspecified: Secondary | ICD-10-CM | POA: Diagnosis not present

## 2014-06-12 DIAGNOSIS — J329 Chronic sinusitis, unspecified: Secondary | ICD-10-CM | POA: Diagnosis not present

## 2014-07-02 DIAGNOSIS — K5900 Constipation, unspecified: Secondary | ICD-10-CM | POA: Diagnosis not present

## 2014-07-02 DIAGNOSIS — I10 Essential (primary) hypertension: Secondary | ICD-10-CM | POA: Diagnosis not present

## 2014-07-02 DIAGNOSIS — B20 Human immunodeficiency virus [HIV] disease: Secondary | ICD-10-CM | POA: Diagnosis not present

## 2014-07-02 DIAGNOSIS — J45909 Unspecified asthma, uncomplicated: Secondary | ICD-10-CM | POA: Diagnosis not present

## 2014-07-02 DIAGNOSIS — E119 Type 2 diabetes mellitus without complications: Secondary | ICD-10-CM | POA: Diagnosis not present

## 2014-07-02 DIAGNOSIS — I2510 Atherosclerotic heart disease of native coronary artery without angina pectoris: Secondary | ICD-10-CM | POA: Diagnosis not present

## 2014-07-10 DIAGNOSIS — J331 Polypoid sinus degeneration: Secondary | ICD-10-CM | POA: Diagnosis not present

## 2014-07-10 DIAGNOSIS — J328 Other chronic sinusitis: Secondary | ICD-10-CM | POA: Diagnosis not present

## 2014-07-24 DIAGNOSIS — L820 Inflamed seborrheic keratosis: Secondary | ICD-10-CM | POA: Diagnosis not present

## 2014-07-24 DIAGNOSIS — L821 Other seborrheic keratosis: Secondary | ICD-10-CM | POA: Diagnosis not present

## 2014-08-28 DIAGNOSIS — B20 Human immunodeficiency virus [HIV] disease: Secondary | ICD-10-CM | POA: Diagnosis not present

## 2014-09-04 DIAGNOSIS — R0602 Shortness of breath: Secondary | ICD-10-CM | POA: Diagnosis not present

## 2014-09-04 DIAGNOSIS — D638 Anemia in other chronic diseases classified elsewhere: Secondary | ICD-10-CM | POA: Diagnosis not present

## 2014-09-04 DIAGNOSIS — R5383 Other fatigue: Secondary | ICD-10-CM | POA: Diagnosis not present

## 2014-09-04 DIAGNOSIS — I10 Essential (primary) hypertension: Secondary | ICD-10-CM | POA: Diagnosis not present

## 2014-09-04 DIAGNOSIS — E782 Mixed hyperlipidemia: Secondary | ICD-10-CM | POA: Diagnosis not present

## 2014-09-04 DIAGNOSIS — J449 Chronic obstructive pulmonary disease, unspecified: Secondary | ICD-10-CM | POA: Diagnosis not present

## 2014-09-18 DIAGNOSIS — R0602 Shortness of breath: Secondary | ICD-10-CM | POA: Diagnosis not present

## 2014-09-19 DIAGNOSIS — R0602 Shortness of breath: Secondary | ICD-10-CM | POA: Diagnosis not present

## 2014-10-07 DIAGNOSIS — J449 Chronic obstructive pulmonary disease, unspecified: Secondary | ICD-10-CM | POA: Diagnosis not present

## 2014-10-07 DIAGNOSIS — L219 Seborrheic dermatitis, unspecified: Secondary | ICD-10-CM | POA: Diagnosis not present

## 2014-10-07 DIAGNOSIS — E871 Hypo-osmolality and hyponatremia: Secondary | ICD-10-CM | POA: Diagnosis not present

## 2014-10-07 DIAGNOSIS — R5383 Other fatigue: Secondary | ICD-10-CM | POA: Diagnosis not present

## 2014-10-07 DIAGNOSIS — R0602 Shortness of breath: Secondary | ICD-10-CM | POA: Diagnosis not present

## 2014-10-08 DIAGNOSIS — E871 Hypo-osmolality and hyponatremia: Secondary | ICD-10-CM | POA: Diagnosis not present

## 2014-10-11 DIAGNOSIS — E785 Hyperlipidemia, unspecified: Secondary | ICD-10-CM | POA: Diagnosis not present

## 2014-10-11 DIAGNOSIS — I340 Nonrheumatic mitral (valve) insufficiency: Secondary | ICD-10-CM | POA: Diagnosis not present

## 2014-10-11 DIAGNOSIS — I10 Essential (primary) hypertension: Secondary | ICD-10-CM | POA: Diagnosis not present

## 2014-10-11 DIAGNOSIS — I2510 Atherosclerotic heart disease of native coronary artery without angina pectoris: Secondary | ICD-10-CM | POA: Diagnosis not present

## 2014-10-11 DIAGNOSIS — K219 Gastro-esophageal reflux disease without esophagitis: Secondary | ICD-10-CM | POA: Diagnosis not present

## 2014-10-16 DIAGNOSIS — J449 Chronic obstructive pulmonary disease, unspecified: Secondary | ICD-10-CM | POA: Diagnosis not present

## 2014-10-16 DIAGNOSIS — R0602 Shortness of breath: Secondary | ICD-10-CM | POA: Diagnosis not present

## 2014-10-24 DIAGNOSIS — L219 Seborrheic dermatitis, unspecified: Secondary | ICD-10-CM | POA: Diagnosis not present

## 2014-10-24 DIAGNOSIS — L578 Other skin changes due to chronic exposure to nonionizing radiation: Secondary | ICD-10-CM | POA: Diagnosis not present

## 2014-10-24 DIAGNOSIS — L570 Actinic keratosis: Secondary | ICD-10-CM | POA: Diagnosis not present

## 2014-10-24 DIAGNOSIS — L820 Inflamed seborrheic keratosis: Secondary | ICD-10-CM | POA: Diagnosis not present

## 2014-11-04 DIAGNOSIS — B20 Human immunodeficiency virus [HIV] disease: Secondary | ICD-10-CM | POA: Diagnosis not present

## 2014-11-04 DIAGNOSIS — I2510 Atherosclerotic heart disease of native coronary artery without angina pectoris: Secondary | ICD-10-CM | POA: Diagnosis not present

## 2014-11-04 DIAGNOSIS — R944 Abnormal results of kidney function studies: Secondary | ICD-10-CM | POA: Diagnosis not present

## 2014-11-04 DIAGNOSIS — I10 Essential (primary) hypertension: Secondary | ICD-10-CM | POA: Diagnosis not present

## 2014-11-04 DIAGNOSIS — J449 Chronic obstructive pulmonary disease, unspecified: Secondary | ICD-10-CM | POA: Diagnosis not present

## 2014-11-08 DIAGNOSIS — Z0001 Encounter for general adult medical examination with abnormal findings: Secondary | ICD-10-CM | POA: Diagnosis not present

## 2014-11-08 DIAGNOSIS — E1165 Type 2 diabetes mellitus with hyperglycemia: Secondary | ICD-10-CM | POA: Diagnosis not present

## 2014-11-08 DIAGNOSIS — E782 Mixed hyperlipidemia: Secondary | ICD-10-CM | POA: Diagnosis not present

## 2014-11-08 DIAGNOSIS — Z125 Encounter for screening for malignant neoplasm of prostate: Secondary | ICD-10-CM | POA: Diagnosis not present

## 2014-12-02 DIAGNOSIS — I959 Hypotension, unspecified: Secondary | ICD-10-CM | POA: Diagnosis not present

## 2014-12-02 DIAGNOSIS — N179 Acute kidney failure, unspecified: Secondary | ICD-10-CM | POA: Diagnosis not present

## 2014-12-02 DIAGNOSIS — N183 Chronic kidney disease, stage 3 (moderate): Secondary | ICD-10-CM | POA: Diagnosis not present

## 2014-12-11 DIAGNOSIS — N182 Chronic kidney disease, stage 2 (mild): Secondary | ICD-10-CM | POA: Diagnosis not present

## 2014-12-11 DIAGNOSIS — N179 Acute kidney failure, unspecified: Secondary | ICD-10-CM | POA: Diagnosis not present

## 2014-12-11 DIAGNOSIS — N183 Chronic kidney disease, stage 3 (moderate): Secondary | ICD-10-CM | POA: Diagnosis not present

## 2014-12-11 DIAGNOSIS — I10 Essential (primary) hypertension: Secondary | ICD-10-CM | POA: Diagnosis not present

## 2014-12-11 DIAGNOSIS — I129 Hypertensive chronic kidney disease with stage 1 through stage 4 chronic kidney disease, or unspecified chronic kidney disease: Secondary | ICD-10-CM | POA: Diagnosis not present

## 2015-01-21 DIAGNOSIS — J328 Other chronic sinusitis: Secondary | ICD-10-CM | POA: Diagnosis not present

## 2015-01-21 DIAGNOSIS — J331 Polypoid sinus degeneration: Secondary | ICD-10-CM | POA: Diagnosis not present

## 2015-02-04 DIAGNOSIS — I10 Essential (primary) hypertension: Secondary | ICD-10-CM | POA: Diagnosis not present

## 2015-02-04 DIAGNOSIS — B20 Human immunodeficiency virus [HIV] disease: Secondary | ICD-10-CM | POA: Diagnosis not present

## 2015-02-04 DIAGNOSIS — F329 Major depressive disorder, single episode, unspecified: Secondary | ICD-10-CM | POA: Diagnosis not present

## 2015-02-04 DIAGNOSIS — N182 Chronic kidney disease, stage 2 (mild): Secondary | ICD-10-CM | POA: Diagnosis not present

## 2015-02-04 DIAGNOSIS — Z0001 Encounter for general adult medical examination with abnormal findings: Secondary | ICD-10-CM | POA: Diagnosis not present

## 2015-02-04 DIAGNOSIS — J45909 Unspecified asthma, uncomplicated: Secondary | ICD-10-CM | POA: Diagnosis not present

## 2015-02-04 DIAGNOSIS — I2510 Atherosclerotic heart disease of native coronary artery without angina pectoris: Secondary | ICD-10-CM | POA: Diagnosis not present

## 2015-02-13 DIAGNOSIS — J449 Chronic obstructive pulmonary disease, unspecified: Secondary | ICD-10-CM | POA: Diagnosis not present

## 2015-02-13 DIAGNOSIS — J439 Emphysema, unspecified: Secondary | ICD-10-CM | POA: Diagnosis not present

## 2015-02-24 DIAGNOSIS — K219 Gastro-esophageal reflux disease without esophagitis: Secondary | ICD-10-CM | POA: Diagnosis not present

## 2015-02-24 DIAGNOSIS — I340 Nonrheumatic mitral (valve) insufficiency: Secondary | ICD-10-CM | POA: Diagnosis not present

## 2015-02-24 DIAGNOSIS — I2510 Atherosclerotic heart disease of native coronary artery without angina pectoris: Secondary | ICD-10-CM | POA: Diagnosis not present

## 2015-02-24 DIAGNOSIS — E785 Hyperlipidemia, unspecified: Secondary | ICD-10-CM | POA: Diagnosis not present

## 2015-02-24 DIAGNOSIS — I10 Essential (primary) hypertension: Secondary | ICD-10-CM | POA: Diagnosis not present

## 2015-02-24 DIAGNOSIS — R079 Chest pain, unspecified: Secondary | ICD-10-CM | POA: Diagnosis not present

## 2015-02-27 DIAGNOSIS — Z21 Asymptomatic human immunodeficiency virus [HIV] infection status: Secondary | ICD-10-CM | POA: Diagnosis not present

## 2015-02-27 DIAGNOSIS — I10 Essential (primary) hypertension: Secondary | ICD-10-CM | POA: Diagnosis not present

## 2015-03-17 DIAGNOSIS — R079 Chest pain, unspecified: Secondary | ICD-10-CM | POA: Diagnosis not present

## 2015-03-21 DIAGNOSIS — E785 Hyperlipidemia, unspecified: Secondary | ICD-10-CM | POA: Diagnosis not present

## 2015-03-21 DIAGNOSIS — I10 Essential (primary) hypertension: Secondary | ICD-10-CM | POA: Diagnosis not present

## 2015-03-21 DIAGNOSIS — K219 Gastro-esophageal reflux disease without esophagitis: Secondary | ICD-10-CM | POA: Diagnosis not present

## 2015-03-21 DIAGNOSIS — I2510 Atherosclerotic heart disease of native coronary artery without angina pectoris: Secondary | ICD-10-CM | POA: Diagnosis not present

## 2015-03-21 DIAGNOSIS — I340 Nonrheumatic mitral (valve) insufficiency: Secondary | ICD-10-CM | POA: Diagnosis not present

## 2015-03-27 DIAGNOSIS — Z23 Encounter for immunization: Secondary | ICD-10-CM | POA: Diagnosis not present

## 2015-04-21 DIAGNOSIS — E785 Hyperlipidemia, unspecified: Secondary | ICD-10-CM | POA: Diagnosis not present

## 2015-04-21 DIAGNOSIS — I340 Nonrheumatic mitral (valve) insufficiency: Secondary | ICD-10-CM | POA: Diagnosis not present

## 2015-04-21 DIAGNOSIS — I10 Essential (primary) hypertension: Secondary | ICD-10-CM | POA: Diagnosis not present

## 2015-04-21 DIAGNOSIS — I2510 Atherosclerotic heart disease of native coronary artery without angina pectoris: Secondary | ICD-10-CM | POA: Diagnosis not present

## 2015-04-21 DIAGNOSIS — K219 Gastro-esophageal reflux disease without esophagitis: Secondary | ICD-10-CM | POA: Diagnosis not present

## 2015-05-30 DIAGNOSIS — E871 Hypo-osmolality and hyponatremia: Secondary | ICD-10-CM | POA: Diagnosis not present

## 2015-05-30 DIAGNOSIS — I10 Essential (primary) hypertension: Secondary | ICD-10-CM | POA: Diagnosis not present

## 2015-05-30 DIAGNOSIS — F329 Major depressive disorder, single episode, unspecified: Secondary | ICD-10-CM | POA: Diagnosis not present

## 2015-05-30 DIAGNOSIS — R5383 Other fatigue: Secondary | ICD-10-CM | POA: Diagnosis not present

## 2015-05-30 DIAGNOSIS — K580 Irritable bowel syndrome with diarrhea: Secondary | ICD-10-CM | POA: Diagnosis not present

## 2015-05-30 DIAGNOSIS — B20 Human immunodeficiency virus [HIV] disease: Secondary | ICD-10-CM | POA: Diagnosis not present

## 2015-05-30 DIAGNOSIS — J449 Chronic obstructive pulmonary disease, unspecified: Secondary | ICD-10-CM | POA: Diagnosis not present

## 2015-05-30 DIAGNOSIS — Z946 Bone transplant status: Secondary | ICD-10-CM | POA: Diagnosis not present

## 2015-06-11 DIAGNOSIS — L570 Actinic keratosis: Secondary | ICD-10-CM | POA: Diagnosis not present

## 2015-06-11 DIAGNOSIS — L718 Other rosacea: Secondary | ICD-10-CM | POA: Diagnosis not present

## 2015-06-11 DIAGNOSIS — D692 Other nonthrombocytopenic purpura: Secondary | ICD-10-CM | POA: Diagnosis not present

## 2015-06-11 DIAGNOSIS — L578 Other skin changes due to chronic exposure to nonionizing radiation: Secondary | ICD-10-CM | POA: Diagnosis not present

## 2015-06-11 DIAGNOSIS — L219 Seborrheic dermatitis, unspecified: Secondary | ICD-10-CM | POA: Diagnosis not present

## 2015-06-11 DIAGNOSIS — L820 Inflamed seborrheic keratosis: Secondary | ICD-10-CM | POA: Diagnosis not present

## 2015-06-12 ENCOUNTER — Encounter: Payer: Self-pay | Admitting: Emergency Medicine

## 2015-06-12 DIAGNOSIS — R1084 Generalized abdominal pain: Secondary | ICD-10-CM | POA: Diagnosis not present

## 2015-06-12 DIAGNOSIS — Z87891 Personal history of nicotine dependence: Secondary | ICD-10-CM | POA: Insufficient documentation

## 2015-06-12 DIAGNOSIS — K5900 Constipation, unspecified: Secondary | ICD-10-CM | POA: Diagnosis not present

## 2015-06-12 DIAGNOSIS — R918 Other nonspecific abnormal finding of lung field: Secondary | ICD-10-CM | POA: Diagnosis not present

## 2015-06-12 LAB — COMPREHENSIVE METABOLIC PANEL
ALBUMIN: 4.4 g/dL (ref 3.5–5.0)
ALT: 14 U/L — ABNORMAL LOW (ref 17–63)
AST: 23 U/L (ref 15–41)
Alkaline Phosphatase: 228 U/L — ABNORMAL HIGH (ref 38–126)
Anion gap: 6 (ref 5–15)
BUN: 29 mg/dL — AB (ref 6–20)
CHLORIDE: 114 mmol/L — AB (ref 101–111)
CO2: 20 mmol/L — AB (ref 22–32)
Calcium: 9.1 mg/dL (ref 8.9–10.3)
Creatinine, Ser: 1.18 mg/dL (ref 0.61–1.24)
GFR calc Af Amer: >60 mL/min (ref >60)
GFR calc non Af Amer: >60 mL/min (ref >60)
GLUCOSE: 127 mg/dL — AB (ref 65–99)
POTASSIUM: 3.4 mmol/L — AB (ref 3.5–5.1)
SODIUM: 140 mmol/L (ref 135–145)
Total Bilirubin: 0.4 mg/dL (ref 0.3–1.2)
Total Protein: 8.5 g/dL — ABNORMAL HIGH (ref 6.5–8.1)

## 2015-06-12 LAB — CBC
HEMATOCRIT: 41.9 % (ref 40.0–52.0)
Hemoglobin: 14.1 g/dL (ref 13.0–18.0)
MCH: 34.1 pg — ABNORMAL HIGH (ref 26.0–34.0)
MCHC: 33.6 g/dL (ref 32.0–36.0)
MCV: 101.3 fL — AB (ref 80.0–100.0)
Platelets: 219 10*3/uL (ref 150–440)
RBC: 4.13 MIL/uL — ABNORMAL LOW (ref 4.40–5.90)
RDW: 12.9 % (ref 11.5–14.5)
WBC: 9.7 10*3/uL (ref 3.8–10.6)

## 2015-06-12 LAB — LIPASE, BLOOD: Lipase: 29 U/L (ref 11–51)

## 2015-06-12 NOTE — ED Notes (Signed)
Patient with complaints of abdominal pain. History of constipation, using laxatives without relief.

## 2015-06-12 NOTE — ED Notes (Signed)
2-3 days no BM.  Last BM 10/31.  Patient states took two enema's (one yesterday and one today) and took a laxative without results.

## 2015-06-13 ENCOUNTER — Emergency Department
Admission: EM | Admit: 2015-06-13 | Discharge: 2015-06-13 | Disposition: A | Discharge status: Home / Self Care | Payer: Medicare Other | Attending: Emergency Medicine | Admitting: Emergency Medicine

## 2015-06-13 ENCOUNTER — Emergency Department: Payer: Medicare Other

## 2015-06-13 DIAGNOSIS — K5900 Constipation, unspecified: Secondary | ICD-10-CM

## 2015-06-13 DIAGNOSIS — R1084 Generalized abdominal pain: Secondary | ICD-10-CM

## 2015-06-13 DIAGNOSIS — R918 Other nonspecific abnormal finding of lung field: Secondary | ICD-10-CM | POA: Diagnosis not present

## 2015-06-13 HISTORY — DX: Other nonspecific abnormal finding of lung field: R91.8

## 2015-06-13 HISTORY — DX: Chronic obstructive pulmonary disease, unspecified: J44.9

## 2015-06-13 HISTORY — DX: Atherosclerotic heart disease of native coronary artery without angina pectoris: I25.10

## 2015-06-13 HISTORY — DX: Unspecified asthma, uncomplicated: J45.909

## 2015-06-13 MED ORDER — DOCUSATE SODIUM 100 MG PO CAPS
100.0000 mg | ORAL_CAPSULE | Freq: Once | ORAL | Status: AC
  Administered 2015-06-13: 100 mg via ORAL

## 2015-06-13 MED ORDER — DOCUSATE SODIUM 100 MG PO CAPS
ORAL_CAPSULE | ORAL | Status: AC
  Administered 2015-06-13: 100 mg via ORAL
  Filled 2015-06-13: qty 1

## 2015-06-13 MED ORDER — LACTULOSE 10 GM/15ML PO SOLN: 20.0000 g | Freq: Every day | ORAL | Status: DC | PRN

## 2015-06-13 MED ORDER — MAGNESIUM CITRATE PO SOLN
ORAL | Status: AC
  Administered 2015-06-13: 150 mL via ORAL
  Filled 2015-06-13: qty 296

## 2015-06-13 MED ORDER — MAGNESIUM CITRATE PO SOLN
150.0000 mL | Freq: Once | ORAL | Status: AC
  Administered 2015-06-13: 150 mL via ORAL

## 2015-06-13 MED ORDER — LACTULOSE 10 GM/15ML PO SOLN
30.0000 g | Freq: Once | ORAL | Status: AC
  Administered 2015-06-13: 30 g via ORAL
  Filled 2015-06-13: qty 60

## 2015-06-13 NOTE — ED Provider Notes (Signed)
[facility] Emergency Department Provider Note  ____________________________________________  Time seen: Approximately 1:33 AM  I have reviewed the triage vital signs and the nursing notes.   HISTORY  Chief Complaint Constipation    HPI Brian Hampton is a 59 y.o. male who presents to the ED from home with a chief complaint of abdominal pain and constipation.Patient states he usually has at least one bowel movement daily; reports no bowel movement 3 days. Reports using 2 fleets enemas without relief of symptoms. Complains of generalized abdominal cramping. Denies associated symptoms of fever, chills, chest pain, shortness breath, nausea, vomiting, diarrhea, urinary difficulty. Denies recent travel. Nothing makes his symptoms better or worse. States he takes daily opiates 10 years.   Past Medical History  Diagnosis Date  . Asthma   . COPD (chronic obstructive pulmonary disease) (HCC)   . Coronary artery disease   . Lung mass   . Renal disorder     There are no active problems to display for this patient.   History reviewed. No pertinent past surgical history.  No current outpatient prescriptions on file.  Allergies Aspirin; Prednisone; Sulfa antibiotics; Bee venom; and Theophyllines  No family history on file.  Social History Social History  Substance Use Topics  . Smoking status: Former Games developermoker  . Smokeless tobacco: Never Used  . Alcohol Use: No    Review of Systems Constitutional: No fever/chills Eyes: No visual changes. ENT: No sore throat. Cardiovascular: Denies chest pain. Respiratory: Denies shortness of breath. Gastrointestinal: Positive for abdominal pain.  No nausea, no vomiting.  No diarrhea.  Positive for constipation. Genitourinary: Negative for dysuria. Musculoskeletal: Negative for back pain. Skin: Negative for rash. Neurological: Negative for headaches, focal weakness or numbness.  10-point ROS otherwise  negative.  ____________________________________________   PHYSICAL EXAM:  VITAL SIGNS: ED Triage Vitals  Enc Vitals Group     BP 06/12/15 2129 103/79 mmHg     Pulse Rate 06/12/15 2129 95     Resp 06/12/15 2129 16     Temp 06/12/15 2129 98.6 F (37 C)     Temp Source 06/12/15 2129 Oral     SpO2 06/12/15 2129 97 %     Weight 06/12/15 2129 159 lb (72.122 kg)     Height 06/12/15 2129 5' (1.524 m)     Head Cir --      Peak Flow --      Pain Score 06/12/15 2128 9     Pain Loc --      Pain Edu? --      Excl. in GC? --     Constitutional: Alert and oriented. Well appearing and in no acute distress. Eyes: Conjunctivae are normal. PERRL. EOMI. Head: Atraumatic. Nose: No congestion/rhinnorhea. Mouth/Throat: Mucous membranes are moist.  Oropharynx non-erythematous. Neck: No stridor.   Cardiovascular: Normal rate, regular rhythm. Grossly normal heart sounds.  Good peripheral circulation. Respiratory: Normal respiratory effort.  No retractions. Lungs CTAB. Gastrointestinal: Soft and mildly tender to palpation diffusely without rebound or guarding. Mild distention. Decreased bowel sounds. No abdominal bruits. No CVA tenderness. Musculoskeletal: No lower extremity tenderness nor edema.  No joint effusions. Neurologic:  Normal speech and language. No gross focal neurologic deficits are appreciated. No gait instability. Skin:  Skin is warm, dry and intact. No rash noted. Psychiatric: Mood and affect are normal. Speech and behavior are normal.  ____________________________________________   LABS (all labs ordered are listed, but only abnormal results are displayed)  Labs Reviewed  COMPREHENSIVE METABOLIC PANEL -  Abnormal; Notable for the following:    Potassium 3.4 (*)    Chloride 114 (*)    CO2 20 (*)    Glucose, Bld 127 (*)    BUN 29 (*)    Total Protein 8.5 (*)    ALT 14 (*)    Alkaline Phosphatase 228 (*)    All other components within normal limits  CBC - Abnormal; Notable  for the following:    RBC 4.13 (*)    MCV 101.3 (*)    MCH 34.1 (*)    All other components within normal limits  LIPASE, BLOOD  URINALYSIS COMPLETEWITH MICROSCOPIC ([facility] ONLY)   ____________________________________________  EKG  ED ECG REPORT I, Janin Kozlowski J, the attending physician, personally viewed and interpreted this ECG.   Date: 06/13/2015  EKG Time: 2136  Rate: 93  Rhythm: normal EKG, normal sinus rhythm  Axis: Normal  Intervals:none  ST&T Change: Nonspecific  ____________________________________________  RADIOLOGY  Abdominal 3-way (viewed by me, interpreted per Dr. Clovis Riley):  Generous colonic stool volume. Negative for obstruction or perforation. Mild linear lung base scarring. ____________________________________________  PROCEDURES  Procedure(s) performed:   ------------------------------------------------------------------------------------------------------------------- Fecal Disimpaction Procedure Note:  Performed by me:  Patient placed in the lateral recumbent position with knees drawn towards chest. Nurse present for patient support. Small amount of soft tan stool removed. No complications during procedure.   ------------------------------------------------------------------------------------------------------------------   Critical Care performed: No  ____________________________________________   INITIAL IMPRESSION / ASSESSMENT AND PLAN / ED COURSE  Pertinent labs & imaging results that were available during my care of the patient were reviewed by me and considered in my medical decision making (see chart for details).  59 year old male who presents with generalized abdominal pain secondary to constipation. X-rays revealed generous colonic stool volume without obstruction. Will administer soapsuds enema, oral lactulose and reassess.  ----------------------------------------- 2:52 AM on  06/13/2015 -----------------------------------------  Patient reports successful 2.5 bowel movements since enema. Will continue on lactulose as needed and patient will follow-up with his PCP. Strict return precautions given. Patient verbalizes understanding and agrees with plan of care. ____________________________________________   FINAL CLINICAL IMPRESSION(S) / ED DIAGNOSES  Final diagnoses:  Generalized abdominal pain  Constipation, unspecified constipation type      Irean Hong, MD 06/13/15 [phone]

## 2015-06-13 NOTE — Discharge Instructions (Signed)
1. You may take laxative as needed for bowel movements (lactulose). 2. Return to the ER for worsening symptoms, persistent vomiting, difficulty breathing or other concerns.  Abdominal Pain, Adult Many things can cause abdominal pain. Usually, abdominal pain is not caused by a disease and will improve without treatment. It can often be observed and treated at home. Your health care provider will do a physical exam and possibly order blood tests and X-rays to help determine the seriousness of your pain. However, in many cases, more time must pass before a clear cause of the pain can be found. Before that point, your health care provider may not know if you need more testing or further treatment. HOME CARE INSTRUCTIONS Monitor your abdominal pain for any changes. The following actions may help to alleviate any discomfort you are experiencing:  Only take over-the-counter or prescription medicines as directed by your health care provider.  Do not take laxatives unless directed to do so by your health care provider.  Try a clear liquid diet (broth, tea, or water) as directed by your health care provider. Slowly move to a bland diet as tolerated. SEEK MEDICAL CARE IF:  You have unexplained abdominal pain.  You have abdominal pain associated with nausea or diarrhea.  You have pain when you urinate or have a bowel movement.  You experience abdominal pain that wakes you in the night.  You have abdominal pain that is worsened or improved by eating food.  You have abdominal pain that is worsened with eating fatty foods.  You have a fever. SEEK IMMEDIATE MEDICAL CARE IF:  Your pain does not go away within 2 hours.  You keep throwing up (vomiting).  Your pain is felt only in portions of the abdomen, such as the right side or the left lower portion of the abdomen.  You pass bloody or black tarry stools. MAKE SURE YOU:  Understand these instructions.  Will watch your condition.  Will get  help right away if you are not doing well or get worse.   This information is not intended to replace advice given to you by your health care provider. Make sure you discuss any questions you have with your health care provider.   Document Released: 05/05/2005 Document Revised: 04/16/2015 Document Reviewed: 04/04/2013 Elsevier Interactive Patient Education 2016 [employer].  Constipation, Adult Constipation is when a person has fewer than three bowel movements a week, has difficulty having a bowel movement, or has stools that are dry, hard, or larger than normal. As people grow older, constipation is more common. A low-fiber diet, not taking in enough fluids, and taking certain medicines may make constipation worse.  CAUSES   Certain medicines, such as antidepressants, pain medicine, iron supplements, antacids, and water pills.   Certain diseases, such as diabetes, irritable bowel syndrome (IBS), thyroid disease, or depression.   Not drinking enough water.   Not eating enough fiber-rich foods.   Stress or travel.   Lack of physical activity or exercise.   Ignoring the urge to have a bowel movement.   Using laxatives too much.  SIGNS AND SYMPTOMS   Having fewer than three bowel movements a week.   Straining to have a bowel movement.   Having stools that are hard, dry, or larger than normal.   Feeling full or bloated.   Pain in the lower abdomen.   Not feeling relief after having a bowel movement.  DIAGNOSIS  Your health care provider will take a medical history and perform  a physical exam. Further testing may be done for severe constipation. Some tests may include:  A barium enema X-ray to examine your rectum, colon, and, sometimes, your small intestine.   A sigmoidoscopy to examine your lower colon.   A colonoscopy to examine your entire colon. TREATMENT  Treatment will depend on the severity of your constipation and what is causing it. Some dietary  treatments include drinking more fluids and eating more fiber-rich foods. Lifestyle treatments may include regular exercise. If these diet and lifestyle recommendations do not help, your health care provider may recommend taking over-the-counter laxative medicines to help you have bowel movements. Prescription medicines may be prescribed if over-the-counter medicines do not work.  HOME CARE INSTRUCTIONS   Eat foods that have a lot of fiber, such as fruits, vegetables, whole grains, and beans.  Limit foods high in fat and processed sugars, such as french fries, hamburgers, cookies, candies, and soda.   A fiber supplement may be added to your diet if you cannot get enough fiber from foods.   Drink enough fluids to keep your urine clear or pale yellow.   Exercise regularly or as directed by your health care provider.   Go to the restroom when you have the urge to go. Do not hold it.   Only take over-the-counter or prescription medicines as directed by your health care provider. Do not take other medicines for constipation without talking to your health care provider first.  SEEK IMMEDIATE MEDICAL CARE IF:   You have bright red blood in your stool.   Your constipation lasts for more than 4 days or gets worse.   You have abdominal or rectal pain.   You have thin, pencil-like stools.   You have unexplained weight loss. MAKE SURE YOU:   Understand these instructions.  Will watch your condition.  Will get help right away if you are not doing well or get worse.   This information is not intended to replace advice given to you by your health care provider. Make sure you discuss any questions you have with your health care provider.   Document Released: 04/23/2004 Document Revised: 08/16/2014 Document Reviewed: 05/07/2013 Elsevier Interactive Patient Education 2016 Elsevier Inc.  High-Fiber Diet Fiber, also called dietary fiber, is a type of carbohydrate found in fruits,  vegetables, whole grains, and beans. A high-fiber diet can have many health benefits. Your health care provider may recommend a high-fiber diet to help:  Prevent constipation. Fiber can make your bowel movements more regular.  Lower your cholesterol.  Relieve hemorrhoids, uncomplicated diverticulosis, or irritable bowel syndrome.  Prevent overeating as part of a weight-loss plan.  Prevent heart disease, type 2 diabetes, and certain cancers. WHAT IS MY PLAN? The recommended daily intake of fiber includes:  38 grams for men under age 58.  30 grams for men over age 58.  25 grams for women under age 58.  21 grams for women over age 650. You can get the recommended daily intake of dietary fiber by eating a variety of fruits, vegetables, grains, and beans. Your health care provider may also recommend a fiber supplement if it is not possible to get enough fiber through your diet. WHAT DO I NEED TO KNOW ABOUT A HIGH-FIBER DIET?  Fiber supplements have not been widely studied for their effectiveness, so it is better to get fiber through food sources.  Always check the fiber content on thenutrition facts label of any prepackaged food. Look for foods that contain at least 5  grams of fiber per serving.  Ask your dietitian if you have questions about specific foods that are related to your condition, especially if those foods are not listed in the following section.  Increase your daily fiber consumption gradually. Increasing your intake of dietary fiber too quickly may cause bloating, cramping, or gas.  Drink plenty of water. Water helps you to digest fiber. WHAT FOODS CAN I EAT? Grains Whole-grain breads. Multigrain cereal. Oats and oatmeal. Brown rice. Barley. Bulgur wheat. Millet. Bran muffins. Popcorn. Rye wafer crackers. Vegetables Sweet potatoes. Spinach. Kale. Artichokes. Cabbage. Broccoli. Green peas. Carrots. Squash. Fruits Berries. Pears. Apples. Oranges. Avocados. Prunes and  raisins. Dried figs. Meats and Other Protein Sources Navy, kidney, pinto, and soy beans. Split peas. Lentils. Nuts and seeds. Dairy Fiber-fortified yogurt. Beverages Fiber-fortified soy milk. Fiber-fortified orange juice. Other Fiber bars. The items listed above may not be a complete list of recommended foods or beverages. Contact your dietitian for more options. WHAT FOODS ARE NOT RECOMMENDED? Grains White bread. Pasta made with refined flour. White rice. Vegetables Fried potatoes. Canned vegetables. Well-cooked vegetables.  Fruits Fruit juice. Cooked, strained fruit. Meats and Other Protein Sources Fatty cuts of meat. Fried Environmental education officer or fried fish. Dairy Milk. Yogurt. Cream cheese. Sour cream. Beverages Soft drinks. Other Cakes and pastries. Butter and oils. The items listed above may not be a complete list of foods and beverages to avoid. Contact your dietitian for more information. WHAT ARE SOME TIPS FOR INCLUDING HIGH-FIBER FOODS IN MY DIET?  Eat a wide variety of high-fiber foods.  Make sure that half of all grains consumed each day are whole grains.  Replace breads and cereals made from refined flour or white flour with whole-grain breads and cereals.  Replace white rice with brown rice, bulgur wheat, or millet.  Start the day with a breakfast that is high in fiber, such as a cereal that contains at least 5 grams of fiber per serving.  Use beans in place of meat in soups, salads, or pasta.  Eat high-fiber snacks, such as berries, raw vegetables, nuts, or popcorn.   This information is not intended to replace advice given to you by your health care provider. Make sure you discuss any questions you have with your health care provider.   Document Released: 07/26/2005 Document Revised: 08/16/2014 Document Reviewed: 01/08/2014 Elsevier Interactive Patient Education [employer]

## 2015-06-19 DIAGNOSIS — K5900 Constipation, unspecified: Secondary | ICD-10-CM | POA: Diagnosis not present

## 2015-06-19 DIAGNOSIS — N182 Chronic kidney disease, stage 2 (mild): Secondary | ICD-10-CM | POA: Diagnosis not present

## 2015-06-23 DIAGNOSIS — K5900 Constipation, unspecified: Secondary | ICD-10-CM | POA: Diagnosis not present

## 2015-06-23 DIAGNOSIS — J449 Chronic obstructive pulmonary disease, unspecified: Secondary | ICD-10-CM | POA: Diagnosis not present

## 2015-06-23 DIAGNOSIS — J309 Allergic rhinitis, unspecified: Secondary | ICD-10-CM | POA: Diagnosis not present

## 2015-06-24 ENCOUNTER — Emergency Department: Payer: Medicare Other

## 2015-06-24 ENCOUNTER — Observation Stay
Admission: EM | Admit: 2015-06-24 | Discharge: 2015-06-25 | Disposition: A | Discharge status: Home / Self Care | Payer: Medicare Other | Attending: Internal Medicine | Admitting: Internal Medicine

## 2015-06-24 ENCOUNTER — Observation Stay
Admit: 2015-06-24 | Discharge: 2015-06-24 | Disposition: A | Discharge status: Home / Self Care | Payer: Medicare Other | Attending: Cardiovascular Disease | Admitting: Cardiovascular Disease

## 2015-06-24 DIAGNOSIS — K219 Gastro-esophageal reflux disease without esophagitis: Secondary | ICD-10-CM | POA: Insufficient documentation

## 2015-06-24 DIAGNOSIS — I2510 Atherosclerotic heart disease of native coronary artery without angina pectoris: Secondary | ICD-10-CM | POA: Diagnosis not present

## 2015-06-24 DIAGNOSIS — J90 Pleural effusion, not elsewhere classified: Secondary | ICD-10-CM | POA: Diagnosis not present

## 2015-06-24 DIAGNOSIS — Z7951 Long term (current) use of inhaled steroids: Secondary | ICD-10-CM | POA: Diagnosis not present

## 2015-06-24 DIAGNOSIS — I252 Old myocardial infarction: Secondary | ICD-10-CM | POA: Diagnosis not present

## 2015-06-24 DIAGNOSIS — J449 Chronic obstructive pulmonary disease, unspecified: Secondary | ICD-10-CM | POA: Insufficient documentation

## 2015-06-24 DIAGNOSIS — I10 Essential (primary) hypertension: Secondary | ICD-10-CM | POA: Diagnosis not present

## 2015-06-24 DIAGNOSIS — Z886 Allergy status to analgesic agent status: Secondary | ICD-10-CM | POA: Diagnosis not present

## 2015-06-24 DIAGNOSIS — J9811 Atelectasis: Secondary | ICD-10-CM | POA: Diagnosis not present

## 2015-06-24 DIAGNOSIS — Z888 Allergy status to other drugs, medicaments and biological substances status: Secondary | ICD-10-CM | POA: Insufficient documentation

## 2015-06-24 DIAGNOSIS — R0600 Dyspnea, unspecified: Secondary | ICD-10-CM | POA: Insufficient documentation

## 2015-06-24 DIAGNOSIS — Z882 Allergy status to sulfonamides status: Secondary | ICD-10-CM | POA: Insufficient documentation

## 2015-06-24 DIAGNOSIS — J439 Emphysema, unspecified: Secondary | ICD-10-CM | POA: Diagnosis not present

## 2015-06-24 DIAGNOSIS — Z8249 Family history of ischemic heart disease and other diseases of the circulatory system: Secondary | ICD-10-CM | POA: Diagnosis not present

## 2015-06-24 DIAGNOSIS — N289 Disorder of kidney and ureter, unspecified: Secondary | ICD-10-CM | POA: Insufficient documentation

## 2015-06-24 DIAGNOSIS — R000 Tachycardia, unspecified: Secondary | ICD-10-CM | POA: Insufficient documentation

## 2015-06-24 DIAGNOSIS — Z21 Asymptomatic human immunodeficiency virus [HIV] infection status: Secondary | ICD-10-CM | POA: Insufficient documentation

## 2015-06-24 DIAGNOSIS — F329 Major depressive disorder, single episode, unspecified: Secondary | ICD-10-CM | POA: Insufficient documentation

## 2015-06-24 DIAGNOSIS — R42 Dizziness and giddiness: Secondary | ICD-10-CM | POA: Diagnosis not present

## 2015-06-24 DIAGNOSIS — R079 Chest pain, unspecified: Secondary | ICD-10-CM | POA: Diagnosis not present

## 2015-06-24 DIAGNOSIS — Z87891 Personal history of nicotine dependence: Secondary | ICD-10-CM | POA: Insufficient documentation

## 2015-06-24 DIAGNOSIS — E119 Type 2 diabetes mellitus without complications: Secondary | ICD-10-CM | POA: Diagnosis not present

## 2015-06-24 DIAGNOSIS — R918 Other nonspecific abnormal finding of lung field: Secondary | ICD-10-CM | POA: Diagnosis not present

## 2015-06-24 DIAGNOSIS — I340 Nonrheumatic mitral (valve) insufficiency: Secondary | ICD-10-CM | POA: Insufficient documentation

## 2015-06-24 DIAGNOSIS — Z91030 Bee allergy status: Secondary | ICD-10-CM | POA: Insufficient documentation

## 2015-06-24 HISTORY — DX: Emphysema, unspecified: J43.9

## 2015-06-24 HISTORY — DX: Acute myocardial infarction, unspecified: I21.9

## 2015-06-24 LAB — CBC
HEMATOCRIT: 40 % (ref 40.0–52.0)
Hemoglobin: 13.3 g/dL (ref 13.0–18.0)
MCH: 32.6 pg (ref 26.0–34.0)
MCHC: 33.2 g/dL (ref 32.0–36.0)
MCV: 98.4 fL (ref 80.0–100.0)
Platelets: 238 10*3/uL (ref 150–440)
RBC: 4.06 MIL/uL — ABNORMAL LOW (ref 4.40–5.90)
RDW: 12.7 % (ref 11.5–14.5)
WBC: 9.8 10*3/uL (ref 3.8–10.6)

## 2015-06-24 LAB — BASIC METABOLIC PANEL
ANION GAP: 7 (ref 5–15)
BUN: 21 mg/dL — AB (ref 6–20)
CO2: 24 mmol/L (ref 22–32)
Calcium: 9.1 mg/dL (ref 8.9–10.3)
Chloride: 111 mmol/L (ref 101–111)
Creatinine, Ser: 1.33 mg/dL — ABNORMAL HIGH (ref 0.61–1.24)
GFR, EST NON AFRICAN AMERICAN: 57 mL/min — AB (ref >60)
Glucose, Bld: 125 mg/dL — ABNORMAL HIGH (ref 65–99)
POTASSIUM: 4 mmol/L (ref 3.5–5.1)
SODIUM: 142 mmol/L (ref 135–145)

## 2015-06-24 LAB — MRSA PCR SCREENING: MRSA BY PCR: NEGATIVE

## 2015-06-24 LAB — TROPONIN I: Troponin I: <0.03 ng/mL (ref <0.031)

## 2015-06-24 LAB — FIBRIN DERIVATIVES D-DIMER (ARMC ONLY): FIBRIN DERIVATIVES D-DIMER (ARMC): 1111 — AB (ref 0–499)

## 2015-06-24 MED ORDER — EFAVIRENZ-EMTRICITAB-TENOFOVIR 600-200-300 MG PO TABS
1.0000 | ORAL_TABLET | Freq: Every day | ORAL | Status: DC
  Administered 2015-06-24: 1 via ORAL
  Filled 2015-06-24 (×2): qty 1

## 2015-06-24 MED ORDER — MOMETASONE FURO-FORMOTEROL FUM 200-5 MCG/ACT IN AERO
2.0000 | INHALATION_SPRAY | Freq: Two times a day (BID) | RESPIRATORY_TRACT | Status: DC
  Administered 2015-06-24: 2 via RESPIRATORY_TRACT
  Filled 2015-06-24: qty 8.8

## 2015-06-24 MED ORDER — GEMFIBROZIL 600 MG PO TABS: 600.0000 mg | ORAL_TABLET | Freq: Two times a day (BID) | ORAL | Status: DC

## 2015-06-24 MED ORDER — ATORVASTATIN CALCIUM 20 MG PO TABS
40.0000 mg | ORAL_TABLET | Freq: Every day | ORAL | Status: DC
  Administered 2015-06-24: 40 mg via ORAL
  Filled 2015-06-24: qty 2

## 2015-06-24 MED ORDER — ALBUTEROL SULFATE (2.5 MG/3ML) 0.083% IN NEBU: 2.5000 mg | INHALATION_SOLUTION | RESPIRATORY_TRACT | Status: DC | PRN

## 2015-06-24 MED ORDER — TRAMADOL HCL 50 MG PO TABS
50.0000 mg | ORAL_TABLET | Freq: Two times a day (BID) | ORAL | Status: DC
  Administered 2015-06-24 – 2015-06-25 (×2): 50 mg via ORAL
  Filled 2015-06-24 (×3): qty 1

## 2015-06-24 MED ORDER — MIRTAZAPINE 15 MG PO TABS
30.0000 mg | ORAL_TABLET | Freq: Every day | ORAL | Status: DC
  Administered 2015-06-24: 30 mg via ORAL
  Filled 2015-06-24: qty 2

## 2015-06-24 MED ORDER — ONDANSETRON HCL 4 MG/2ML IJ SOLN: 4.0000 mg | Freq: Four times a day (QID) | INTRAMUSCULAR | Status: DC | PRN

## 2015-06-24 MED ORDER — METOPROLOL TARTRATE 100 MG PO TABS
100.0000 mg | ORAL_TABLET | Freq: Once | ORAL | Status: AC
  Administered 2015-06-25: 100 mg via ORAL
  Filled 2015-06-24: qty 1

## 2015-06-24 MED ORDER — POLYETHYLENE GLYCOL 3350 17 G PO PACK: 17.0000 g | PACK | Freq: Every day | ORAL | Status: DC | PRN

## 2015-06-24 MED ORDER — ESCITALOPRAM OXALATE 10 MG PO TABS
20.0000 mg | ORAL_TABLET | Freq: Every day | ORAL | Status: DC
  Administered 2015-06-24 – 2015-06-25 (×2): 20 mg via ORAL
  Filled 2015-06-24 (×2): qty 2

## 2015-06-24 MED ORDER — MELOXICAM 7.5 MG PO TABS
7.5000 mg | ORAL_TABLET | Freq: Two times a day (BID) | ORAL | Status: DC
  Administered 2015-06-24 – 2015-06-25 (×3): 7.5 mg via ORAL
  Filled 2015-06-24 (×3): qty 1

## 2015-06-24 MED ORDER — ENOXAPARIN SODIUM 40 MG/0.4ML ~~LOC~~ SOLN
40.0000 mg | SUBCUTANEOUS | Status: DC
  Administered 2015-06-24: 40 mg via SUBCUTANEOUS
  Filled 2015-06-24: qty 0.4

## 2015-06-24 MED ORDER — SODIUM CHLORIDE 0.9 % IV SOLN
INTRAVENOUS | Status: DC
Start: 2015-06-24 — End: 2015-06-24

## 2015-06-24 MED ORDER — TIOTROPIUM BROMIDE MONOHYDRATE 18 MCG IN CAPS
18.0000 ug | ORAL_CAPSULE | Freq: Every day | RESPIRATORY_TRACT | Status: DC
  Administered 2015-06-24: 18 ug via RESPIRATORY_TRACT
  Filled 2015-06-24: qty 5

## 2015-06-24 MED ORDER — FLEET ENEMA 7-19 GM/118ML RE ENEM: 1.0000 | ENEMA | Freq: Once | RECTAL | Status: DC | PRN

## 2015-06-24 MED ORDER — ONDANSETRON HCL 4 MG PO TABS: 4.0000 mg | ORAL_TABLET | Freq: Four times a day (QID) | ORAL | Status: DC | PRN

## 2015-06-24 MED ORDER — BISACODYL 10 MG RE SUPP
10.0000 mg | Freq: Every day | RECTAL | Status: DC | PRN
  Filled 2015-06-24: qty 1

## 2015-06-24 MED ORDER — FLUTICASONE PROPIONATE 50 MCG/ACT NA SUSP
1.0000 | Freq: Every day | NASAL | Status: DC
  Filled 2015-06-24: qty 16

## 2015-06-24 MED ORDER — ACETAMINOPHEN 325 MG PO TABS
650.0000 mg | ORAL_TABLET | Freq: Four times a day (QID) | ORAL | Status: DC | PRN
Start: 2015-06-24 — End: 2015-06-25

## 2015-06-24 MED ORDER — ACETAMINOPHEN 650 MG RE SUPP
650.0000 mg | Freq: Four times a day (QID) | RECTAL | Status: DC | PRN
Start: 2015-06-24 — End: 2015-06-25

## 2015-06-24 MED ORDER — ENOXAPARIN SODIUM 100 MG/ML ~~LOC~~ SOLN
SUBCUTANEOUS | Status: AC
  Administered 2015-06-24: 40 mg via SUBCUTANEOUS
  Filled 2015-06-24: qty 1

## 2015-06-24 MED ORDER — LACTULOSE 10 GM/15ML PO SOLN
20.0000 g | Freq: Every day | ORAL | Status: DC
  Filled 2015-06-24: qty 30

## 2015-06-24 MED ORDER — IOHEXOL 350 MG/ML SOLN
75.0000 mL | Freq: Once | INTRAVENOUS | Status: AC | PRN
  Administered 2015-06-24: 75 mL via INTRAVENOUS

## 2015-06-24 MED ORDER — HYDROCODONE-ACETAMINOPHEN 5-325 MG PO TABS
1.0000 | ORAL_TABLET | ORAL | Status: DC | PRN
  Administered 2015-06-24: 2 via ORAL
  Filled 2015-06-24: qty 2

## 2015-06-24 MED ORDER — PANTOPRAZOLE SODIUM 40 MG PO TBEC
40.0000 mg | DELAYED_RELEASE_TABLET | Freq: Every day | ORAL | Status: DC
Start: 2015-06-24 — End: 2015-06-25
  Administered 2015-06-24 – 2015-06-25 (×2): 40 mg via ORAL
  Filled 2015-06-24 (×2): qty 1

## 2015-06-24 MED ORDER — DOCUSATE SODIUM 100 MG PO CAPS
100.0000 mg | ORAL_CAPSULE | Freq: Two times a day (BID) | ORAL | Status: DC
  Administered 2015-06-24 – 2015-06-25 (×3): 100 mg via ORAL
  Filled 2015-06-24 (×3): qty 1

## 2015-06-24 MED ORDER — MORPHINE SULFATE (PF) 4 MG/ML IV SOLN
4.0000 mg | Freq: Once | INTRAVENOUS | Status: AC
  Administered 2015-06-24: 4 mg via INTRAVENOUS
  Filled 2015-06-24: qty 1

## 2015-06-24 MED ORDER — SODIUM CHLORIDE 0.9 % IJ SOLN
3.0000 mL | Freq: Two times a day (BID) | INTRAMUSCULAR | Status: DC
  Administered 2015-06-24: 3 mL via INTRAVENOUS

## 2015-06-24 MED ORDER — ONDANSETRON HCL 4 MG/2ML IJ SOLN
4.0000 mg | Freq: Once | INTRAMUSCULAR | Status: AC
  Administered 2015-06-24: 4 mg via INTRAVENOUS
  Filled 2015-06-24: qty 2

## 2015-06-24 MED ORDER — CARVEDILOL 12.5 MG PO TABS
12.5000 mg | ORAL_TABLET | Freq: Two times a day (BID) | ORAL | Status: DC
  Administered 2015-06-24 – 2015-06-25 (×4): 12.5 mg via ORAL
  Filled 2015-06-24 (×3): qty 1

## 2015-06-24 MED ORDER — ISOSORBIDE MONONITRATE ER 30 MG PO TB24
30.0000 mg | ORAL_TABLET | Freq: Every day | ORAL | Status: DC
  Administered 2015-06-24 – 2015-06-25 (×2): 30 mg via ORAL
  Filled 2015-06-24 (×2): qty 1

## 2015-06-24 MED ORDER — CLONAZEPAM 0.5 MG PO TABS
0.5000 mg | ORAL_TABLET | Freq: Two times a day (BID) | ORAL | Status: DC | PRN
Start: 2015-06-24 — End: 2015-06-25

## 2015-06-24 NOTE — ED Notes (Signed)
Pt presents to ED via EMS with chest pain that started around 11pm. Pt has hx of MI from 2000. Pt took 3 nitro at home before EMS arrival, pt states no relief from nitro.

## 2015-06-24 NOTE — ED Notes (Signed)
Dr. Brown at bedside

## 2015-06-24 NOTE — ED Notes (Addendum)
Williemae Natteronnie Gibson, pt's sister, went home, but would like to be updated when pt gets a room upstairs. Number is [phone].

## 2015-06-24 NOTE — Care Management (Signed)
Patient admitted under observation for chest pain. Patient resides in a boarding house that has single rooms for nine residents.  He is on the lower level, has access to kitchen, bathroom and has a refrigerator in his room.  He receives 50 dollars in food stamps.  this ws recently decreased from 120 dollars and patient does not know why.  Says that he receives assistance  from [employer] for his medications and use [employer] for medicaid transportation. His sister Williemae NatterConnie Gibson provides support.  Patient receives around 700 dollars a month disability.  His rent costs him 400 dollars.  He has looked into the housing authority but "I have to be 59 years old."  At present no discharge needs identified

## 2015-06-24 NOTE — ED Notes (Signed)
[place], [place]ge with sister

## 2015-06-24 NOTE — ED Notes (Signed)
Pt has hx of MI from 2000. Pt took 3 Nitro at home before EMS arrival, states no relief. Pt states pain a 13/10. Pt alert and oriented x4.

## 2015-06-24 NOTE — Care Management Obs Status (Signed)
.  MEDICARE OBSERVATION STATUS NOTIFICATION   Patient Details  Name: Brian Hampton MRN: [id number] Date of Birth: 06-27-1956   Medicare Observation Status Notification Given:  Given to pt. In er    Berna BueCheryl Chaun Uemura, RN 06/24/2015, 8:24 AM

## 2015-06-24 NOTE — Progress Notes (Signed)
*  PRELIMINARY RESULTS* Echocardiogram 2D Echocardiogram has been performed.  Georgann HousekeeperJerry R Hege 06/24/2015, 2:15 PM

## 2015-06-24 NOTE — Progress Notes (Signed)
[facility] [place] [facility]   PATIENT NAME: Brian Hampton    MR#:  [id number]  DATE OF BIRTH:  Mar 09, 1956  SUBJECTIVE: 59 year old male patient with history of COPD, hypertension, coronary artery disease admitted for chest pain rule out to today. Patient complains of chest pain across the chest and the diaphragm area more with a deep breath. No cough, no fever, no nausea, no vomiting, no dizziness, no shortness of breath.   CHIEF COMPLAINT:   Chief Complaint  Patient presents with  . Chest Pain    REVIEW OF SYSTEMS:   ROS CONSTITUTIONAL: No fever, fatigue or weakness.  EYES: No blurred or double vision.  EARS, NOSE, AND THROAT: No tinnitus or ear pain.  RESPIRATORY: No cough, shortness of breath, wheezing or hemoptysis.  CARDIOVASCULAR: No chest pain, orthopnea, edema.  GASTROINTESTINAL: No nausea, vomiting, diarrhea or abdominal pain.  GENITOURINARY: No dysuria, hematuria.  ENDOCRINE: No polyuria, nocturia,  HEMATOLOGY: No anemia, easy bruising or bleeding SKIN: No rash or lesion. MUSCULOSKELETAL: No joint pain or arthritis.   NEUROLOGIC: No tingling, numbness, weakness.  PSYCHIATRY: No anxiety or depression.   DRUG ALLERGIES:   Allergies  Allergen Reactions  . Aspirin Anaphylaxis  . Prednisone Anaphylaxis  . Sulfa Antibiotics Anaphylaxis  . Bee Venom   . Theophyllines     VITALS:  Blood pressure 155/92, pulse 95, temperature 98.3 F (36.8 C), temperature source Oral, resp. rate 18, height 5\' 5"  (1.651 m), weight 70.353 kg (155 lb 1.6 oz), SpO2 97 %.  PHYSICAL EXAMINATION:  GENERAL:  59 y.o.-year-old patient lying in the bed with no acute distress.  EYES: Pupils equal, round, reactive to light and accommodation. No scleral icterus. Extraocular muscles intact.  HEENT: Head atraumatic, normocephalic. Oropharynx and nasopharynx clear.  NECK:  Supple, no jugular venous distention. No thyroid enlargement, no tenderness.  LUNGS: Normal  breath sounds bilaterally, no wheezing, rales,rhonchi or crepitation. No use of accessory muscles of respiration.  CARDIOVASCULAR: S1, S2 normal. No murmurs, rubs, or gallops. Mild costochondral tenderness present. ABDOMEN: Soft, nontender, nondistended. Bowel sounds present. No organomegaly or mass.  EXTREMITIES: No pedal edema, cyanosis, or clubbing.  NEUROLOGIC: Cranial nerves II through XII are intact. Muscle strength 5/5 in all extremities. Sensation intact. Gait not checked.  PSYCHIATRIC: The patient is alert and oriented x 3.  SKIN: No obvious rash, lesion, or ulcer.    LABORATORY PANEL:   CBC  Recent Labs Lab 06/24/15 0241  WBC 9.8  HGB 13.3  HCT 40.0  PLT 238   ------------------------------------------------------------------------------------------------------------------  Chemistries   Recent Labs Lab 06/24/15 0241  NA 142  K 4.0  CL 111  CO2 24  GLUCOSE 125*  BUN 21*  CREATININE 1.33*  CALCIUM 9.1   ------------------------------------------------------------------------------------------------------------------  Cardiac Enzymes  Recent Labs Lab 06/24/15 0241  TROPONINI <0.03   ------------------------------------------------------------------------------------------------------------------  RADIOLOGY:  Dg Chest 2 View  06/24/2015  CLINICAL DATA:  Chest pain for 3 hours. EXAM: CHEST  2 VIEW COMPARISON:  Chest and abdominal radiographs 06/13/2015. Chest CT 06/13/2010 FINDINGS: Lungs are hyperinflated with emphysema. Linear subsegmental atelectasis or scarring at the bases. No consolidation to suggest pneumonia. Heart at the upper limits normal in size, mediastinal contours are normal. No pulmonary edema, pleural effusion or pneumothorax. Compression deformity of midthoracic vertebra, appears chronic based on thoracic spine MRI 07/08/2011. No acute osseous abnormalities are seen pre IMPRESSION: Hyperinflation and emphysema.  No superimposed acute process.  Electronically Signed   By: Lujean RaveMelanie  Ehinger M.D.  On: 06/24/2015 03:24   Ct Angio Chest Pe W/cm &/or Wo Cm  06/24/2015  CLINICAL DATA:  Chest pain, onset 5 hours prior. Dyspnea and tachycardia. EXAM: CT ANGIOGRAPHY CHEST WITH CONTRAST TECHNIQUE: Multidetector CT imaging of the chest was performed using the standard protocol during bolus administration of intravenous contrast. Multiplanar CT image reconstructions and MIPs were obtained to evaluate the vascular anatomy. CONTRAST:  75mL OMNIPAQUE IOHEXOL 350 MG/ML SOLN COMPARISON:  Radiographs 06/24/2015.  Chest CT 06/13/2010 FINDINGS: There are no filling defects within the pulmonary arteries to suggest pulmonary embolus. The thoracic aorta is normal in caliber without dissection or aneurysm. There is a common origin of the brachiocephalic and left common carotid artery from the aortic arch, bovine configuration. Coronary artery calcifications are seen. No mediastinal or hilar adenopathy. Diminutive left pleural effusion. No right pleural effusion. No pericardial effusion. Mild apical predominant emphysema and bronchial thickening. Scattered linear atelectasis primarily peripherally. Compressive atelectasis in the left lower lobe. Motion artifact in the lower lobes partially obscures evaluation. No pulmonary mass or suspicious nodule. No findings pulmonary edema. No acute abnormality in the included upper abdomen. Degenerative change in the spine without acute osseous abnormality. Chronic compression deformity of T8 vertebra compared to thoracic spine MRI of 2012. Review of the MIP images confirms the above findings. IMPRESSION: 1. No pulmonary embolus. 2. Diminutive left pleural effusion. 3. Mild emphysema. Scattered linear atelectasis. Mild compressive atelectasis in the left lower lobe. 4. Coronary artery calcifications. Electronically Signed   By: Rubye Oaks M.D.   On: 06/24/2015 05:44    EKG:   Orders placed or performed during the hospital  encounter of 06/24/15  . ED EKG within 10 minutes  . ED EKG within 10 minutes    ASSESSMENT AND PLAN:  37. 59 year old male patient with chest pain: Patient is monitored on telemetry, observe him for 23 hours, for chest pain/posible ACS;. First set of troponins are negative. Patient is requesting cardiology consult with Dr. Jaquita Folds. Continue beta blockers. Patient allergic to aspirin. #2 history of COPD: Patient has no wheezing at this time. Continue home medications with Dulera, when necessary albuterol.,Spiriva #3 history of depression continue Lexapro, mirtazapine, Klonopin. #4 history of HIV patient takes the antiretrovirals the continue Atripla.  Likely discharge home if further cardiac workup is planned. Consult cardio as per patient request ,  All the records are reviewed and case discussed with Care Management/Social Workerr. Management plans discussed with the patient, family and they are in agreement.  CODE STATUS: full  TOTAL TIME TAKING CARE OF THIS PATIENT:35 minutes.   POSSIBLE D/C IN 1  DAYS, DEPENDING ON CLINICAL CONDITION.   Katha Hamming M.D on 06/24/2015 at 9:32 AM  Between 7am to 6pm - Pager - [phone]  After 6pm go to [email or web address] - password [email or web address]  [facility]  Office  [phone]  CC: Primary care physician; [facility]   Note: This dictation was prepared with Dragon dictation along with smaller phrase technology. Any transcriptional errors that result from this process are unintentional.

## 2015-06-24 NOTE — Care Management Obs Status (Addendum)
MEDICARE OBSERVATION STATUS NOTIFICATION   Patient Details  Name: Brian Hampton MRN: [id number] Date of Birth: 18-Jun-1956   Medicare Observation Status Notification Given:  Yes. Given to pt. In ER.    Berna Bueheryl Suhaila Troiano, RN 06/24/2015, 8:25 AM

## 2015-06-24 NOTE — ED Provider Notes (Signed)
[facility] Emergency Department Provider Note  ____________________________________________  Time seen: 2:50 AM  I have reviewed the triage vital signs and the nursing notes.   HISTORY  Chief Complaint Chest Pain      HPI Brian Hampton is a 59 y.o. male presents with acute onset of of diffuse chest pain at 11:00 PM. Patient admitted to dyspnea no nausea vomiting or diaphoresis. Patient states that he had a history of a myocardial infarction in 2000 [facility] which [facility] however no cardiac stent was placed.     Past Medical History  Diagnosis Date  . Asthma   . COPD (chronic obstructive pulmonary disease) (HCC)   . Coronary artery disease   . Lung mass   . Renal disorder   . Emphysema/COPD (HCC)   . Myocardial infarction (HCC)     in 2000    There are no active problems to display for this patient.   History reviewed. No pertinent past surgical history.  Current Outpatient Rx  Name  Route  Sig  Dispense  Refill  . albuterol (PROVENTIL HFA;VENTOLIN HFA) 108 (90 BASE) MCG/ACT inhaler   Inhalation   Inhale 2 puffs into the lungs every 6 (six) hours as needed for wheezing or shortness of breath.         Marland Kitchen. albuterol (PROVENTIL) (2.5 MG/3ML) 0.083% nebulizer solution   Nebulization   Take 2.5 mg by nebulization every 6 (six) hours as needed for wheezing or shortness of breath.         . carvedilol (COREG) 12.5 MG tablet   Oral   Take 12.5 mg by mouth 2 (two) times daily with a meal.         . clonazePAM (KLONOPIN) 0.5 MG tablet   Oral   Take 0.5 mg by mouth 2 (two) times daily as needed for anxiety.         Marland Kitchen. efavirenz-emtricitabine-tenofovir (ATRIPLA) 600-200-300 MG tablet   Oral   Take 1 tablet by mouth at bedtime.         Marland Kitchen. escitalopram (LEXAPRO) 20 MG tablet   Oral   Take 20 mg by mouth daily.         . fluticasone (FLONASE) 50 MCG/ACT nasal spray   Each Nare   Place 1 spray into both nostrils daily.          Marland Kitchen. gemfibrozil (LOPID) 600 MG tablet   Oral   Take 600 mg by mouth 2 (two) times daily before a meal.         . isosorbide mononitrate (IMDUR) 30 MG 24 hr tablet   Oral   Take 30 mg by mouth daily.         . meloxicam (MOBIC) 7.5 MG tablet   Oral   Take 7.5 mg by mouth 2 (two) times daily.         . mirtazapine (REMERON) 30 MG tablet   Oral   Take 30 mg by mouth at bedtime.         . mometasone-formoterol (DULERA) 200-5 MCG/ACT AERO   Inhalation   Inhale 2 puffs into the lungs 3 (three) times daily.         Marland Kitchen. omeprazole (PRILOSEC) 20 MG capsule   Oral   Take 20 mg by mouth 2 (two) times daily before a meal.         . tiotropium (SPIRIVA) 18 MCG inhalation capsule   Inhalation   Place 18 mcg into inhaler  and inhale daily.         . traMADol (ULTRAM) 50 MG tablet   Oral   Take 50 mg by mouth 2 (two) times daily.         Marland Kitchen lactulose (CHRONULAC) 10 GM/15ML solution   Oral   Take 30 mLs (20 g total) by mouth daily as needed for mild constipation. Patient not taking: Reported on 06/24/2015   120 mL   0     Allergies Aspirin; Prednisone; Sulfa antibiotics; Bee venom; and Theophyllines  No family history on file.  Social History Social History  Substance Use Topics  . Smoking status: Former Games developer  . Smokeless tobacco: Never Used  . Alcohol Use: No    Review of Systems  Constitutional: Negative for fever. Eyes: Negative for visual changes. ENT: Negative for sore throat. Cardiovascular: Positive for chest pain. Respiratory: Negative for shortness of breath. Gastrointestinal: Negative for abdominal pain, vomiting and diarrhea. Genitourinary: Negative for dysuria. Musculoskeletal: Negative for back pain. Skin: Negative for rash. Neurological: Negative for headaches, focal weakness or numbness.   10-point ROS otherwise negative.  ____________________________________________   PHYSICAL EXAM:  VITAL SIGNS: ED Triage Vitals  Enc Vitals  Group     BP 06/24/15 0235 153/90 mmHg     Pulse Rate 06/24/15 0235 103     Resp 06/24/15 0235 28     Temp 06/24/15 0235 99.2 F (37.3 C)     Temp Source 06/24/15 0235 Oral     SpO2 06/24/15 0232 97 %     Weight 06/24/15 0235 157 lb (71.215 kg)     Height 06/24/15 0235  (1.651 m)     Head Cir --      Peak Flow --      Pain Score 06/24/15 0237 10     Pain Loc --      Pain Edu? --      Excl. in GC? --      Constitutional: Alert and oriented. Well appearing and in no distress. Eyes: Conjunctivae are normal. PERRL. Normal extraocular movements. ENT   Head: Normocephalic and atraumatic.   Nose: No congestion/rhinnorhea.   Mouth/Throat: Mucous membranes are moist.   Neck: No stridor. Hematological/Lymphatic/Immunilogical: No cervical lymphadenopathy. Cardiovascular: Normal rate, regular rhythm. Normal and symmetric distal pulses are present in all extremities. No murmurs, rubs, or gallops. Respiratory: Normal respiratory effort without tachypnea nor retractions. Breath sounds are clear and equal bilaterally. No wheezes/rales/rhonchi. Gastrointestinal: Soft and nontender. No distention. There is no CVA tenderness. Genitourinary: deferred Musculoskeletal: Nontender with normal range of motion in all extremities. No joint effusions.  No lower extremity tenderness nor edema. Neurologic:  Normal speech and language. No gross focal neurologic deficits are appreciated. Speech is normal.  Skin:  Skin is warm, dry and intact. No rash noted. Psychiatric: Mood and affect are normal. Speech and behavior are normal. Patient exhibits appropriate insight and judgment.  ____________________________________________    LABS (pertinent positives/negatives)  Labs Reviewed  BASIC METABOLIC PANEL - Abnormal; Notable for the following:    Glucose, Bld 125 (*)    BUN 21 (*)    Creatinine, Ser 1.33 (*)    GFR calc non Af Amer 57 (*)    All other components within normal limits  CBC  - Abnormal; Notable for the following:    RBC 4.06 (*)    All other components within normal limits  FIBRIN DERIVATIVES D-DIMER ([facility] ONLY) - Abnormal; Notable for the following:    Fibrin derivatives D-dimer (  [facility]) 1111 (*)    All other components within normal limits  TROPONIN I     ____________________________________________   EKG  ED ECG REPORT I, BROWN, [place] N, the attending physician, personally viewed and interpreted this ECG.   Date: 06/24/2015  EKG Time: 2:36AM  Rate: 103  Rhythm: Sinus tachycardia  Axis: None  Intervals: Normal  ST&T Change: None   ____________________________________________    RADIOLOGY     DG Chest 2 View (Final result) Result time: 06/24/15 03:24:48   Final result by Rad Results In Interface (06/24/15 03:24:48)   Narrative:   CLINICAL DATA: Chest pain for 3 hours.  EXAM: CHEST 2 VIEW  COMPARISON: Chest and abdominal radiographs 06/13/2015. Chest CT 06/13/2010  FINDINGS: Lungs are hyperinflated with emphysema. Linear subsegmental atelectasis or scarring at the bases. No consolidation to suggest pneumonia. Heart at the upper limits normal in size, mediastinal contours are normal. No pulmonary edema, pleural effusion or pneumothorax. Compression deformity of midthoracic vertebra, appears chronic based on thoracic spine MRI 07/08/2011. No acute osseous abnormalities are seen pre  IMPRESSION: Hyperinflation and emphysema. No superimposed acute process.   Electronically Signed By: Rubye Oaks M.D. On: 06/24/2015 03:24          INITIAL IMPRESSION / ASSESSMENT AND PLAN / ED COURSE  Pertinent labs & imaging results that were available during my care of the patient were reviewed by me and considered in my medical decision making (see chart for details).  Given history and physical exam concern for possible cardiac etiology of the patient's chest pain. CT scan of the chest was performed secondary to  history and d-dimer of 1111, however CT revealed no evidence of pulmonary emboli. Patient discussed with Dr. Elpidio Anis for hospital admission for further evaluation and management.  ____________________________________________   FINAL CLINICAL IMPRESSION(S) / ED DIAGNOSES  Final diagnoses:  Chest pain, unspecified chest pain type      Darci Current, MD 06/24/15 [phone]

## 2015-06-24 NOTE — ED Notes (Signed)
Patient transported to X-ray via stretcher 

## 2015-06-24 NOTE — ED Notes (Signed)
Nurse unavaliable to receive report

## 2015-06-24 NOTE — Consult Note (Signed)
Primary Cardiologist: Dr. Adrian BlackwaterShaukat Khan   Reason for Consultation : Atypical chest pain   HPI : This is a [age] yoM with PMHx mild CAD, HTN, HL, DM, GERD and COPD who presented to ER c/o chest pain. He states pain is pleuritic, right and left sided across diaphragm, no associated SOB, nausea or diaphoresis. Pt has had multiple evaluations of coronaries recently with only mild plaque in LAD.        Review of Systems: General: negative for chills, fever, night sweats or weight changes.  Cardiovascular: positive for chest pain, negative for edema, orthopnea, palpitations, paroxysmal nocturnal dyspnea, shortness of breath or dyspnea on exertion Dermatological: negative for rash Respiratory: negative for cough or wheezing Urologic: negative for hematuria Abdominal: negative for nausea, vomiting, diarrhea, bright red blood per rectum, melena, or hematemesis Neurologic: negative for visual changes, syncope, or dizziness All other systems reviewed and are otherwise negative except as noted above.    Past Medical History  Diagnosis Date  . Asthma   . COPD (chronic obstructive pulmonary disease) (HCC)   . Coronary artery disease   . Lung mass   . Renal disorder   . Emphysema/COPD (HCC)   . Myocardial infarction (HCC)     in 2000  . Diabetes mellitus without complication (HCC)     Medications Prior to Admission  Medication Sig Dispense Refill  . albuterol (PROVENTIL HFA;VENTOLIN HFA) 108 (90 BASE) MCG/ACT inhaler Inhale 2 puffs into the lungs every 6 (six) hours as needed for wheezing or shortness of breath.    Marland Kitchen. albuterol (PROVENTIL) (2.5 MG/3ML) 0.083% nebulizer solution Take 2.5 mg by nebulization every 6 (six) hours as needed for wheezing or shortness of breath.    . carvedilol (COREG) 12.5 MG tablet Take 12.5 mg by mouth 2 (two) times daily with a meal.    . clonazePAM (KLONOPIN) 0.5 MG tablet Take 0.5 mg by mouth 2 (two) times daily as needed for anxiety.    Marland Kitchen.  efavirenz-emtricitabine-tenofovir (ATRIPLA) 600-200-300 MG tablet Take 1 tablet by mouth at bedtime.    Marland Kitchen. escitalopram (LEXAPRO) 20 MG tablet Take 20 mg by mouth daily.    . fluticasone (FLONASE) 50 MCG/ACT nasal spray Place 1 spray into both nostrils daily.    Marland Kitchen. gemfibrozil (LOPID) 600 MG tablet Take 600 mg by mouth 2 (two) times daily before a meal.    . isosorbide mononitrate (IMDUR) 30 MG 24 hr tablet Take 30 mg by mouth daily.    . meloxicam (MOBIC) 7.5 MG tablet Take 7.5 mg by mouth 2 (two) times daily.    . mirtazapine (REMERON) 30 MG tablet Take 30 mg by mouth at bedtime.    . mometasone-formoterol (DULERA) 200-5 MCG/ACT AERO Inhale 2 puffs into the lungs 3 (three) times daily.    Marland Kitchen. omeprazole (PRILOSEC) 20 MG capsule Take 20 mg by mouth 2 (two) times daily before a meal.    . tiotropium (SPIRIVA) 18 MCG inhalation capsule Place 18 mcg into inhaler and inhale daily.    . traMADol (ULTRAM) 50 MG tablet Take 50 mg by mouth 2 (two) times daily.    Marland Kitchen. lactulose (CHRONULAC) 10 GM/15ML solution Take 30 mLs (20 g total) by mouth daily as needed for mild constipation. (Patient not taking: Reported on 06/24/2015) 120 mL 0     . carvedilol  12.5 mg Oral BID WC  . docusate sodium  100 mg Oral BID  . efavirenz-emtricitabine-tenofovir  1 tablet Oral QHS  . enoxaparin (LOVENOX) injection  40 mg Subcutaneous Q24H  . escitalopram  20 mg Oral Daily  . fluticasone  1 spray Each Nare Daily  . gemfibrozil  600 mg Oral BID AC  . isosorbide mononitrate  30 mg Oral Daily  . lactulose  20 g Oral Daily  . meloxicam  7.5 mg Oral BID  . [START ON 06/25/2015] metoprolol tartrate  100 mg Oral Once  . mirtazapine  30 mg Oral QHS  . mometasone-formoterol  2 puff Inhalation BID  . pantoprazole  40 mg Oral Daily  . sodium chloride  3 mL Intravenous Q12H  . tiotropium  18 mcg Inhalation Daily  . traMADol  50 mg Oral BID    Infusions:    Allergies  Allergen Reactions  . Aspirin Anaphylaxis  . Prednisone  Anaphylaxis  . Sulfa Antibiotics Anaphylaxis  . Bee Venom   . Theophyllines     Social History   Social History  . Marital Status: Single    Spouse Name: N/A  . Number of Children: N/A  . Years of Education: N/A   Occupational History  . Not on file.   Social History Main Topics  . Smoking status: Former Games developer  . Smokeless tobacco: Never Used  . Alcohol Use: No  . Drug Use: No  . Sexual Activity: Not on file   Other Topics Concern  . Not on file   Social History Narrative    Family History  Problem Relation Age of Onset  . CAD      PHYSICAL EXAM: Filed Vitals:   06/24/15 1130  BP: 127/81  Pulse: 76  Temp: 98.3 F (36.8 C)  Resp: 18    No intake or output data in the 24 hours ending 06/24/15 1325  General:  Well appearing. No respiratory difficulty HEENT: normal Neck: supple. no JVD. Carotids 2+ bilat; no bruits. No lymphadenopathy or thryomegaly appreciated. Cor: PMI nondisplaced. Regular rate & rhythm. No rubs, gallops or murmurs. Lungs: clear Abdomen: soft, nontender, nondistended. No hepatosplenomegaly. No bruits or masses. Good bowel sounds. Extremities: no cyanosis, clubbing, rash, edema Neuro: alert & oriented x 3, cranial nerves grossly intact. moves all 4 extremities w/o difficulty. Affect pleasant.  ECG: NSR 71 BPM no acute changes   Results for orders placed or performed during the hospital encounter of 06/24/15 (from the past 24 hour(s))  Basic metabolic panel     Status: Abnormal   Collection Time: 06/24/15  2:41 AM  Result Value Ref Range   Sodium 142 135 - 145 mmol/L   Potassium 4.0 3.5 - 5.1 mmol/L   Chloride 111 101 - 111 mmol/L   CO2 24 22 - 32 mmol/L   Glucose, Bld 125 (H) 65 - 99 mg/dL   BUN 21 (H) 6 - 20 mg/dL   Creatinine, Ser 4.09 (H) 0.61 - 1.24 mg/dL   Calcium 9.1 8.9 - 81.1 mg/dL   GFR calc non Af Amer 57 (L) >60 mL/min   GFR calc Af Amer >60 >60 mL/min   Anion gap 7 5 - 15  CBC     Status: Abnormal   Collection Time:  06/24/15  2:41 AM  Result Value Ref Range   WBC 9.8 3.8 - 10.6 K/uL   RBC 4.06 (L) 4.40 - 5.90 MIL/uL   Hemoglobin 13.3 13.0 - 18.0 g/dL   HCT 91.4 78.2 - 95.6 %   MCV 98.4 80.0 - 100.0 fL   MCH 32.6 26.0 - 34.0 pg   MCHC 33.2 32.0 - 36.0 g/dL  RDW 12.7 11.5 - 14.5 %   Platelets 238 150 - 440 K/uL  Troponin I     Status: None   Collection Time: 06/24/15  2:41 AM  Result Value Ref Range   Troponin I <0.03 <0.031 ng/mL  Fibrin derivatives D-Dimer     Status: Abnormal   Collection Time: 06/24/15  2:46 AM  Result Value Ref Range   Fibrin derivatives D-dimer ([facility]) 1111 (H) 0 - 499  Troponin I     Status: None   Collection Time: 06/24/15 11:05 AM  Result Value Ref Range   Troponin I <0.03 <0.031 ng/mL   Dg Chest 2 View  06/24/2015  CLINICAL DATA:  Chest pain for 3 hours. EXAM: CHEST  2 VIEW COMPARISON:  Chest and abdominal radiographs 06/13/2015. Chest CT 06/13/2010 FINDINGS: Lungs are hyperinflated with emphysema. Linear subsegmental atelectasis or scarring at the bases. No consolidation to suggest pneumonia. Heart at the upper limits normal in size, mediastinal contours are normal. No pulmonary edema, pleural effusion or pneumothorax. Compression deformity of midthoracic vertebra, appears chronic based on thoracic spine MRI 07/08/2011. No acute osseous abnormalities are seen pre IMPRESSION: Hyperinflation and emphysema.  No superimposed acute process. Electronically Signed   By: Rubye Oaks M.D.   On: 06/24/2015 03:24   Ct Angio Chest Pe W/cm &/or Wo Cm  06/24/2015  CLINICAL DATA:  Chest pain, onset 5 hours prior. Dyspnea and tachycardia. EXAM: CT ANGIOGRAPHY CHEST WITH CONTRAST TECHNIQUE: Multidetector CT imaging of the chest was performed using the standard protocol during bolus administration of intravenous contrast. Multiplanar CT image reconstructions and MIPs were obtained to evaluate the vascular anatomy. CONTRAST:  75mL OMNIPAQUE IOHEXOL 350 MG/ML SOLN COMPARISON:   Radiographs 06/24/2015.  Chest CT 06/13/2010 FINDINGS: There are no filling defects within the pulmonary arteries to suggest pulmonary embolus. The thoracic aorta is normal in caliber without dissection or aneurysm. There is a common origin of the brachiocephalic and left common carotid artery from the aortic arch, bovine configuration. Coronary artery calcifications are seen. No mediastinal or hilar adenopathy. Diminutive left pleural effusion. No right pleural effusion. No pericardial effusion. Mild apical predominant emphysema and bronchial thickening. Scattered linear atelectasis primarily peripherally. Compressive atelectasis in the left lower lobe. Motion artifact in the lower lobes partially obscures evaluation. No pulmonary mass or suspicious nodule. No findings pulmonary edema. No acute abnormality in the included upper abdomen. Degenerative change in the spine without acute osseous abnormality. Chronic compression deformity of T8 vertebra compared to thoracic spine MRI of 2012. Review of the MIP images confirms the above findings. IMPRESSION: 1. No pulmonary embolus. 2. Diminutive left pleural effusion. 3. Mild emphysema. Scattered linear atelectasis. Mild compressive atelectasis in the left lower lobe. 4. Coronary artery calcifications. Electronically Signed   By: Rubye Oaks M.D.   On: 06/24/2015 05:44     ASSESSMENT: Atypical chest pain   PLAN/DISCUSSION: Pt has known mild CAD, no EKG changes, troponin negative x3, CT-PE negative.   Pt ok to go home, he has CTA coronaries scheduled for 11am tomorrow morning.  -For CCTA, HR needs to be between 55-60 BPM, please give metoprolol tartrate  once tomorrow morning at 8am.  -Pts sister will be to [facility] by 9am, and will bring pt straight to our office for CCTA.    Patient and plan discussed with supervising provider, Dr. Adrian Blackwater, who agrees with above findings.   Alinda Sierras Margarito Courser [facility] 06/24/2015 1:25  PM

## 2015-06-24 NOTE — H&P (Addendum)
[facility] [place] [facility]   PATIENT NAME: Brian Hampton    MR#:  [id number]  DATE OF BIRTH:  January 15, 1956  DATE OF ADMISSION:  06/24/2015  PRIMARY CARE PHYSICIAN: [facility]   REQUESTING/REFERRING PHYSICIAN: Dr. Manson PasseyBrown  CHIEF COMPLAINT:   Chief Complaint  Patient presents with  . Chest Pain    HISTORY OF PRESENT ILLNESS:  Brian Hampton  is a 59 y.o. male with a known history of CAD, COPD presents to the emergency room complaining of acute onset of left-sided chest pain at 11 PM. This pain has radiated to his left arm and right side of the chest. Patient tried some nitroglycerin pills which did not help the pain. In the emergency room patient has received some morphine but seems to have decreased the pain. He had sweating and lightheadedness with the pain. Shortness of breath positive. He had a normal stress test 1 year back with Dr. Welton FlakesKhan.   PAST MEDICAL HISTORY:   Past Medical History  Diagnosis Date  . Asthma   . COPD (chronic obstructive pulmonary disease) (HCC)   . Coronary artery disease   . Lung mass   . Renal disorder   . Emphysema/COPD (HCC)   . Myocardial infarction (HCC)     in 2000  . Diabetes mellitus without complication (HCC)     PAST SURGICAL HISTORY:  History reviewed. No pertinent past surgical history.  SOCIAL HISTORY:   Social History  Substance Use Topics  . Smoking status: Former Games developermoker  . Smokeless tobacco: Never Used  . Alcohol Use: No    FAMILY HISTORY:   Family History  Problem Relation Age of Onset  . CAD      DRUG ALLERGIES:   Allergies  Allergen Reactions  . Aspirin Anaphylaxis  . Prednisone Anaphylaxis  . Sulfa Antibiotics Anaphylaxis  . Bee Venom   . Theophyllines     REVIEW OF SYSTEMS:   Review of Systems  Constitutional: Positive for malaise/fatigue. Negative for fever, chills and weight loss.  HENT: Negative for hearing loss and nosebleeds.   Eyes: Negative for blurred  vision, double vision and pain.  Respiratory: Negative for cough, hemoptysis, sputum production, shortness of breath and wheezing.   Cardiovascular: Negative for chest pain, palpitations, orthopnea and leg swelling.  Gastrointestinal: Negative for nausea, vomiting, abdominal pain, diarrhea and constipation.  Genitourinary: Negative for dysuria and hematuria.  Musculoskeletal: Negative for myalgias, back pain and falls.  Skin: Negative for rash.  Neurological: Negative for dizziness, tremors, sensory change, speech change, focal weakness, seizures and headaches.  Endo/Heme/Allergies: Does not bruise/bleed easily.  Psychiatric/Behavioral: Negative for depression and memory loss. The patient is not nervous/anxious.     MEDICATIONS AT HOME:   Prior to Admission medications   Medication Sig Start Date End Date Taking? Authorizing Provider  albuterol (PROVENTIL HFA;VENTOLIN HFA) 108 (90 BASE) MCG/ACT inhaler Inhale 2 puffs into the lungs every 6 (six) hours as needed for wheezing or shortness of breath.   Yes Historical Provider, MD  albuterol (PROVENTIL) (2.5 MG/3ML) 0.083% nebulizer solution Take 2.5 mg by nebulization every 6 (six) hours as needed for wheezing or shortness of breath.   Yes Historical Provider, MD  carvedilol (COREG) 12.5 MG tablet Take 12.5 mg by mouth 2 (two) times daily with a meal.   Yes Historical Provider, MD  clonazePAM (KLONOPIN) 0.5 MG tablet Take 0.5 mg by mouth 2 (two) times daily as needed for anxiety.   Yes Historical Provider, MD  efavirenz-emtricitabine-tenofovir (ATRIPLA) 600-200-300 MG tablet Take 1 tablet by mouth at bedtime.   Yes Historical Provider, MD  escitalopram (LEXAPRO) 20 MG tablet Take 20 mg by mouth daily.   Yes Historical Provider, MD  fluticasone (FLONASE) 50 MCG/ACT nasal spray Place 1 spray into both nostrils daily.   Yes Historical Provider, MD  gemfibrozil (LOPID) 600 MG tablet Take 600 mg by mouth 2 (two) times daily before a meal.   Yes  Historical Provider, MD  isosorbide mononitrate (IMDUR) 30 MG 24 hr tablet Take 30 mg by mouth daily.   Yes Historical Provider, MD  meloxicam (MOBIC) 7.5 MG tablet Take 7.5 mg by mouth 2 (two) times daily.   Yes Historical Provider, MD  mirtazapine (REMERON) 30 MG tablet Take 30 mg by mouth at bedtime.   Yes Historical Provider, MD  mometasone-formoterol (DULERA) 200-5 MCG/ACT AERO Inhale 2 puffs into the lungs 3 (three) times daily.   Yes Historical Provider, MD  omeprazole (PRILOSEC) 20 MG capsule Take 20 mg by mouth 2 (two) times daily before a meal.   Yes Historical Provider, MD  tiotropium (SPIRIVA) 18 MCG inhalation capsule Place 18 mcg into inhaler and inhale daily.   Yes Historical Provider, MD  traMADol (ULTRAM) 50 MG tablet Take 50 mg by mouth 2 (two) times daily.   Yes Historical Provider, MD  lactulose (CHRONULAC) 10 GM/15ML solution Take 30 mLs (20 g total) by mouth daily as needed for mild constipation. Patient not taking: Reported on 06/24/2015 06/13/15   Irean Hong, MD      VITAL SIGNS:  Blood pressure 130/83, pulse 84, temperature 99.2 F (37.3 C), temperature source Oral, resp. rate 26, height  (1.651 m), weight 71.215 kg (157 lb), SpO2 96 %.  PHYSICAL EXAMINATION:  Physical Exam  GENERAL:  59 y.o.-year-old patient lying in the bed with no acute distress.  EYES: Pupils equal, round, reactive to light and accommodation. No scleral icterus. Extraocular muscles intact.  HEENT: Head atraumatic, normocephalic. Oropharynx and nasopharynx clear. No oropharyngeal erythema, moist oral mucosa  NECK:  Supple, no jugular venous distention. No thyroid enlargement, no tenderness.  LUNGS: Normal breath sounds bilaterally, no wheezing, rales, rhonchi. No use of accessory muscles of respiration.  CARDIOVASCULAR: S1, S2 normal. No murmurs, rubs, or gallops.  ABDOMEN: Soft, nontender, nondistended. Bowel sounds present. No organomegaly or mass.  EXTREMITIES: No pedal edema, cyanosis,  or clubbing. + 2 pedal & radial pulses b/l.   NEUROLOGIC: Cranial nerves II through XII are intact. No focal Motor or sensory deficits appreciated b/l PSYCHIATRIC: The patient is alert and oriented x 3. Good affect.  SKIN: No obvious rash, lesion, or ulcer.   LABORATORY PANEL:   CBC  Recent Labs Lab 06/24/15 0241  WBC 9.8  HGB 13.3  HCT 40.0  PLT 238   ------------------------------------------------------------------------------------------------------------------  Chemistries   Recent Labs Lab 06/24/15 0241  NA 142  K 4.0  CL 111  CO2 24  GLUCOSE 125*  BUN 21*  CREATININE 1.33*  CALCIUM 9.1   ------------------------------------------------------------------------------------------------------------------  Cardiac Enzymes  Recent Labs Lab 06/24/15 0241  TROPONINI <0.03   ------------------------------------------------------------------------------------------------------------------  RADIOLOGY:  Dg Chest 2 View  06/24/2015  CLINICAL DATA:  Chest pain for 3 hours. EXAM: CHEST  2 VIEW COMPARISON:  Chest and abdominal radiographs 06/13/2015. Chest CT 06/13/2010 FINDINGS: Lungs are hyperinflated with emphysema. Linear subsegmental atelectasis or scarring at the bases. No consolidation to suggest pneumonia. Heart at the upper limits normal in size, mediastinal contours are normal. No  pulmonary edema, pleural effusion or pneumothorax. Compression deformity of midthoracic vertebra, appears chronic based on thoracic spine MRI 07/08/2011. No acute osseous abnormalities are seen pre IMPRESSION: Hyperinflation and emphysema.  No superimposed acute process. Electronically Signed   By: Rubye Oaks M.D.   On: 06/24/2015 03:24   Ct Angio Chest Pe W/cm &/or Wo Cm  06/24/2015  CLINICAL DATA:  Chest pain, onset 5 hours prior. Dyspnea and tachycardia. EXAM: CT ANGIOGRAPHY CHEST WITH CONTRAST TECHNIQUE: Multidetector CT imaging of the chest was performed using the standard  protocol during bolus administration of intravenous contrast. Multiplanar CT image reconstructions and MIPs were obtained to evaluate the vascular anatomy. CONTRAST:  75mL OMNIPAQUE IOHEXOL 350 MG/ML SOLN COMPARISON:  Radiographs 06/24/2015.  Chest CT 06/13/2010 FINDINGS: There are no filling defects within the pulmonary arteries to suggest pulmonary embolus. The thoracic aorta is normal in caliber without dissection or aneurysm. There is a common origin of the brachiocephalic and left common carotid artery from the aortic arch, bovine configuration. Coronary artery calcifications are seen. No mediastinal or hilar adenopathy. Diminutive left pleural effusion. No right pleural effusion. No pericardial effusion. Mild apical predominant emphysema and bronchial thickening. Scattered linear atelectasis primarily peripherally. Compressive atelectasis in the left lower lobe. Motion artifact in the lower lobes partially obscures evaluation. No pulmonary mass or suspicious nodule. No findings pulmonary edema. No acute abnormality in the included upper abdomen. Degenerative change in the spine without acute osseous abnormality. Chronic compression deformity of T8 vertebra compared to thoracic spine MRI of 2012. Review of the MIP images confirms the above findings. IMPRESSION: 1. No pulmonary embolus. 2. Diminutive left pleural effusion. 3. Mild emphysema. Scattered linear atelectasis. Mild compressive atelectasis in the left lower lobe. 4. Coronary artery calcifications. Electronically Signed   By: Rubye Oaks M.D.   On: 06/24/2015 05:44     IMPRESSION AND PLAN:   * Chest pain radiating to left arm Had normal stress test 1 year back at Dr. Milta Deiters office. Repeat troponin. Admit to telemetry. Consult cardiology for further input regarding need for catheterization. pt is allergic to aspirin. CTA chest showed no PE. Left atelectasis.  * COPD Stable  * HIV  * DVT prophylaxis with Lovenox   All the records  are reviewed and case discussed with ED provider. Management plans discussed with the patient, family and they are in agreement.  CODE STATUS: FULL  TOTAL TIME TAKING CARE OF THIS PATIENT: 40 minutes.    Milagros Loll R M.D on 06/24/2015 at 7:15 AM  Between 7am to 6pm - Pager - [phone]  After 6pm go to [email or web address] - password [email or web address]  [facility]  Office  [phone]  CC: Primary care physician; [facility]     Note: This dictation was prepared with Dragon dictation along with smaller phrase technology. Any transcriptional errors that result from this process are unintentional.

## 2015-06-24 NOTE — Progress Notes (Signed)
Patient alert and oriented x4. Oriented to room, unit, and call bell. Admission completed. No complaints at this time. Will cont to assess. Skin assessment verified by Trinna Postanya RN. Telemetry box verified. Trudee KusterBrandi R Mansfield

## 2015-06-25 DIAGNOSIS — R079 Chest pain, unspecified: Secondary | ICD-10-CM | POA: Diagnosis not present

## 2015-06-25 DIAGNOSIS — J449 Chronic obstructive pulmonary disease, unspecified: Secondary | ICD-10-CM | POA: Diagnosis not present

## 2015-06-25 DIAGNOSIS — I10 Essential (primary) hypertension: Secondary | ICD-10-CM | POA: Diagnosis not present

## 2015-06-25 DIAGNOSIS — I200 Unstable angina: Secondary | ICD-10-CM | POA: Diagnosis not present

## 2015-06-25 DIAGNOSIS — Z21 Asymptomatic human immunodeficiency virus [HIV] infection status: Secondary | ICD-10-CM | POA: Diagnosis not present

## 2015-06-25 LAB — LIPID PANEL
Cholesterol: 161 mg/dL (ref 0–200)
HDL: 42 mg/dL (ref >40)
LDL CALC: 100 mg/dL — AB (ref 0–99)
TRIGLYCERIDES: 96 mg/dL (ref <150)
Total CHOL/HDL Ratio: 3.8 RATIO
VLDL: 19 mg/dL (ref 0–40)

## 2015-06-25 MED ORDER — ATORVASTATIN CALCIUM 40 MG PO TABS: 40.0000 mg | ORAL_TABLET | Freq: Every day | ORAL | Status: DC

## 2015-06-25 NOTE — Discharge Summary (Signed)
Brian Hampton, is a 59 y.o. male  DOB 09-Nov-1955  MRN [id number].  Admission date:  06/24/2015  Admitting Physician  Hillary Bow, MD  Discharge Date:  06/25/2015   Primary MD  [place]  Recommendations for primary care physician for things to follow:  Follow-up with Dr. Celesta Gentile and office today at 33 AM for coronary CT.   Admission Diagnosis  Chest pain, unspecified chest pain type [R07.9]   Discharge Diagnosis  Chest pain, unspecified chest pain type [R07.9]    Active Problems:   Chest pain      Past Medical History  Diagnosis Date  . Asthma   . COPD (chronic obstructive pulmonary disease) ([employer] [place])   . Coronary artery disease   . Lung mass   . Renal disorder   . Emphysema/COPD ([place])   . Myocardial infarction ([place])     in 2000  . Diabetes mellitus without complication ([place])     History reviewed. No pertinent past surgical history.     History of present illness and  Hospital Course:     Kindly see H&P for history of present illness and admission details, please review complete Labs, Consult reports and Test reports for all details in brief  HPI  from the history and physical done on the day of admission 59 year old male patient with COPD, coronary artery disease admitted for left-sided chest pain. Patient admitted for chest pain rule out. Normal stress test an year ago with Dr.khan.  Hospital Course  1, chest pain ; monitored on telemetry, troponins are negative for 3 times. Cardiology consult obtained with the Dr. Humphrey Rolls, patient had no EKG changes. CT angio  chest negative for PE.Marland Kitchen Patient will see Dr. Humphrey Rolls  in the office today at 11 AM for see CCTA.. Patient received a dose of metoprolol 100 mg to keep heart rate around 55-64 cc daily. As requested by cardiology. Advise patient to  continue Coreg, aspirin, Imdur, statins. High intensity statins are started. Dr. Humphrey Rolls can de-escalate as needed. *#2 history of HIV: Patient takes atripla. Continue them. 3. COPD: No wheezing. Continue Spiriva, albuterol, Dulera. Isolated alk phos elevation.  Discharge Condition: stable   Follow UP      Discharge Instructions  and  Discharge Medications        Medication List    TAKE these medications        albuterol 108 (90 BASE) MCG/ACT inhaler  Commonly known as:  PROVENTIL HFA;VENTOLIN HFA  Inhale 2 puffs into the lungs every 6 (six) hours as needed for wheezing or shortness of breath.     albuterol (2.5 MG/3ML) 0.083% nebulizer solution  Commonly known as:  PROVENTIL  Take 2.5 mg by nebulization every 6 (six) hours as needed for wheezing or shortness of breath.     atorvastatin 40 MG tablet  Commonly known as:  LIPITOR  Take 1 tablet (40 mg total) by mouth daily at 6 PM.     carvedilol 12.5 MG tablet  Commonly known as:  COREG  Take 12.5 mg by mouth 2 (two) times daily with a meal.     clonazePAM 0.5 MG tablet  Commonly known as:  KLONOPIN  Take 0.5 mg by mouth 2 (two) times daily as needed for anxiety.     DULERA 200-5 MCG/ACT Aero  Generic drug:  mometasone-formoterol  Inhale 2 puffs into the lungs 3 (three) times daily.     efavirenz-emtricitabine-tenofovir 600-200-300 MG tablet  Commonly known as:  ATRIPLA  Take 1 tablet by mouth at bedtime.     escitalopram 20 MG tablet  Commonly known as:  LEXAPRO  Take 20 mg by mouth daily.     fluticasone 50 MCG/ACT nasal spray  Commonly known as:  FLONASE  Place 1 spray into both nostrils daily.     gemfibrozil 600 MG tablet  Commonly known as:  LOPID  Take 600 mg by mouth 2 (two) times daily before a meal.     isosorbide mononitrate 30 MG 24 hr tablet  Commonly known as:  IMDUR  Take 30 mg by mouth daily.     lactulose 10 GM/15ML solution  Commonly known as:  CHRONULAC  Take 30 mLs (20 g total) by  mouth daily as needed for mild constipation.     meloxicam 7.5 MG tablet  Commonly known as:  MOBIC  Take 7.5 mg by mouth 2 (two) times daily.     mirtazapine 30 MG tablet  Commonly known as:  REMERON  Take 30 mg by mouth at bedtime.     omeprazole 20 MG capsule  Commonly known as:  PRILOSEC  Take 20 mg by mouth 2 (two) times daily before a meal.     tiotropium 18 MCG inhalation capsule  Commonly known as:  SPIRIVA  Place 18 mcg into inhaler and inhale daily.     traMADol 50 MG tablet  Commonly known as:  ULTRAM  Take 50 mg by mouth 2 (two) times daily.          Diet and Activity recommendation: See Discharge Instructions above   Consults obtained -cardio   Major procedures and Radiology Reports - PLEASE review detailed and final reports for all details, in brief -      Dg Chest 2 View  06/24/2015  CLINICAL DATA:  Chest pain for 3 hours. EXAM: CHEST  2 VIEW COMPARISON:  Chest and abdominal radiographs 06/13/2015. Chest CT 06/13/2010 FINDINGS: Lungs are hyperinflated with emphysema. Linear subsegmental atelectasis or scarring at the bases. No consolidation to suggest pneumonia. Heart at the upper limits normal in size, mediastinal contours are normal. No pulmonary edema, pleural effusion or pneumothorax. Compression deformity of midthoracic vertebra, appears chronic based on thoracic spine MRI 07/08/2011. No acute osseous abnormalities are seen pre IMPRESSION: Hyperinflation and emphysema.  No superimposed acute process. Electronically Signed   By: Jeb Levering M.D.   On: 06/24/2015 03:24   Ct Angio Chest Pe W/cm &/or Wo Cm  06/24/2015  CLINICAL DATA:  Chest pain, onset 5 hours prior. Dyspnea and tachycardia. EXAM: CT ANGIOGRAPHY CHEST WITH CONTRAST TECHNIQUE: Multidetector CT imaging of the chest was performed using the standard protocol during bolus administration of intravenous contrast. Multiplanar CT image reconstructions and MIPs were obtained to evaluate the  vascular anatomy. CONTRAST:  71m OMNIPAQUE IOHEXOL 350 MG/ML SOLN COMPARISON:  Radiographs 06/24/2015.  Chest CT 06/13/2010 FINDINGS: There are no filling defects within the pulmonary arteries to suggest pulmonary embolus. The thoracic aorta is normal in caliber without dissection or aneurysm. There is a common origin of the brachiocephalic and left common carotid artery from the aortic arch, bovine configuration. Coronary artery calcifications are seen. No mediastinal or hilar adenopathy. Diminutive left pleural effusion. No right pleural effusion. No pericardial effusion. Mild apical predominant emphysema and bronchial thickening. Scattered linear atelectasis primarily peripherally. Compressive atelectasis in the left lower lobe. Motion artifact in the lower lobes partially obscures evaluation. No pulmonary mass or suspicious nodule. No findings pulmonary edema. No acute abnormality in the included  upper abdomen. Degenerative change in the spine without acute osseous abnormality. Chronic compression deformity of T8 vertebra compared to thoracic spine MRI of 2012. Review of the MIP images confirms the above findings. IMPRESSION: 1. No pulmonary embolus. 2. Diminutive left pleural effusion. 3. Mild emphysema. Scattered linear atelectasis. Mild compressive atelectasis in the left lower lobe. 4. Coronary artery calcifications. Electronically Signed   By: Jeb Levering M.D.   On: 06/24/2015 05:44   Dg Abd Acute W/chest  06/13/2015  CLINICAL DATA:  No bowel movement for several days. EXAM: DG ABDOMEN ACUTE W/ 1V CHEST COMPARISON:  CT 10/02/2011 FINDINGS: There is no evidence of dilated bowel loops or free intraperitoneal air. Generous colonic stool volume. No radiopaque calculi or other significant radiographic abnormality is seen. Heart size and mediastinal contours are within normal limits. Both lungs are clear except for mild linear basilar scarring. IMPRESSION: Generous colonic stool volume. Negative for  obstruction or perforation. Mild linear lung base scarring. Electronically Signed   By: Andreas Newport M.D.   On: 06/13/2015 01:11    Micro Results    Recent Results (from the past 240 hour(s))  MRSA PCR Screening     Status: None   Collection Time: 06/24/15  2:05 PM  Result Value Ref Range Status   MRSA by PCR NEGATIVE NEGATIVE Final    Comment:        The GeneXpert MRSA Assay (FDA approved for NASAL specimens only), is one component of a comprehensive MRSA colonization surveillance program. It is not intended to diagnose MRSA infection nor to guide or monitor treatment for MRSA infections.        Today   Subjective:   Brian Hampton today has no headache,no chest abdominal pain,no new weakness tingling or numbness, feels much better wants to go home today.   Objective:   Blood pressure 145/84, pulse 102, temperature 98.7 F (37.1 C), temperature source Oral, resp. rate 18, height _0  (1.651 m), weight 70.444 kg (155 lb 4.8 oz), SpO2 92 %.   Intake/Output Summary (Last 24 hours) at 06/25/15 1509 Last data filed at 06/25/15 0500  Gross per 24 hour  Intake    243 ml  Output      0 ml  Net    243 ml    Exam Awake Alert, Oriented x 3, No new F.N deficits, Normal affect [place].AT,PERRAL Supple Neck,No JVD, No cervical lymphadenopathy appriciated.  Symmetrical Chest wall movement, Good air movement bilaterally, CTAB RRR,No Gallops,Rubs or new Murmurs, No Parasternal Heave +ve B.Sounds, Abd Soft, Non tender, No organomegaly appriciated, No rebound -guarding or rigidity. No Cyanosis, Clubbing or edema, No new Rash or bruise  Data Review   CBC w Diff: Lab Results  Component Value Date   WBC 9.8 06/24/2015   WBC 4.9 10/02/2011   HGB 13.3 06/24/2015   HGB 13.1 10/02/2011   HCT 40.0 06/24/2015   HCT 39.9* 10/02/2011   PLT 238 06/24/2015   PLT 232 10/02/2011    CMP: Lab Results  Component Value Date   NA 142 06/24/2015   NA 145 10/02/2011   K 4.0 06/24/2015    K 3.9 10/02/2011   CL 111 06/24/2015   CL 111* 10/02/2011   CO2 24 06/24/2015   CO2 22 10/02/2011   BUN 21* 06/24/2015   BUN 16 10/02/2011   CREATININE 1.33* 06/24/2015   CREATININE 0.97 10/02/2011   PROT 8.5* 06/12/2015   PROT 8.2 10/02/2011   ALBUMIN 4.4 06/12/2015   ALBUMIN 4.2 10/02/2011  BILITOT 0.4 06/12/2015   BILITOT 0.3 10/02/2011   ALKPHOS 228* 06/12/2015   ALKPHOS 187* 10/02/2011   AST 23 06/12/2015   AST 28 10/02/2011   ALT 14* 06/12/2015   ALT 19 10/02/2011  .   Total Time in preparing paper work, data evaluation and todays exam - 67 minutes  Leontyne Manville M.D on 06/25/2015 at 3:09 PM    Note: This dictation was prepared with Dragon dictation along with smaller phrase technology. Any transcriptional errors that result from this process are unintentional.

## 2015-06-25 NOTE — Progress Notes (Signed)
Discharge instructions explained to pt/ verbalized an understanding/ iv and tele removed/ instructed to go to Dr. Welton FlakesKhan office for apt.

## 2015-06-30 DIAGNOSIS — R079 Chest pain, unspecified: Secondary | ICD-10-CM | POA: Diagnosis not present

## 2015-06-30 DIAGNOSIS — I10 Essential (primary) hypertension: Secondary | ICD-10-CM | POA: Diagnosis not present

## 2015-06-30 DIAGNOSIS — K219 Gastro-esophageal reflux disease without esophagitis: Secondary | ICD-10-CM | POA: Diagnosis not present

## 2015-06-30 DIAGNOSIS — E785 Hyperlipidemia, unspecified: Secondary | ICD-10-CM | POA: Diagnosis not present

## 2015-06-30 DIAGNOSIS — I2510 Atherosclerotic heart disease of native coronary artery without angina pectoris: Secondary | ICD-10-CM | POA: Diagnosis not present

## 2015-07-25 DIAGNOSIS — J310 Chronic rhinitis: Secondary | ICD-10-CM | POA: Diagnosis not present

## 2015-07-25 DIAGNOSIS — J328 Other chronic sinusitis: Secondary | ICD-10-CM | POA: Diagnosis not present

## 2015-07-31 DIAGNOSIS — E785 Hyperlipidemia, unspecified: Secondary | ICD-10-CM | POA: Diagnosis not present

## 2015-07-31 DIAGNOSIS — I10 Essential (primary) hypertension: Secondary | ICD-10-CM | POA: Diagnosis not present

## 2015-07-31 DIAGNOSIS — K219 Gastro-esophageal reflux disease without esophagitis: Secondary | ICD-10-CM | POA: Diagnosis not present

## 2015-08-08 DIAGNOSIS — I10 Essential (primary) hypertension: Secondary | ICD-10-CM | POA: Diagnosis not present

## 2015-08-08 DIAGNOSIS — N183 Chronic kidney disease, stage 3 (moderate): Secondary | ICD-10-CM | POA: Diagnosis not present

## 2015-08-08 DIAGNOSIS — Z21 Asymptomatic human immunodeficiency virus [HIV] infection status: Secondary | ICD-10-CM | POA: Diagnosis not present

## 2015-08-08 DIAGNOSIS — E7800 Pure hypercholesterolemia, unspecified: Secondary | ICD-10-CM | POA: Diagnosis not present

## 2015-09-30 DIAGNOSIS — I2510 Atherosclerotic heart disease of native coronary artery without angina pectoris: Secondary | ICD-10-CM | POA: Diagnosis not present

## 2015-09-30 DIAGNOSIS — I10 Essential (primary) hypertension: Secondary | ICD-10-CM | POA: Diagnosis not present

## 2015-09-30 DIAGNOSIS — F329 Major depressive disorder, single episode, unspecified: Secondary | ICD-10-CM | POA: Diagnosis not present

## 2015-09-30 DIAGNOSIS — J449 Chronic obstructive pulmonary disease, unspecified: Secondary | ICD-10-CM | POA: Diagnosis not present

## 2015-09-30 DIAGNOSIS — J019 Acute sinusitis, unspecified: Secondary | ICD-10-CM | POA: Diagnosis not present

## 2015-09-30 DIAGNOSIS — N182 Chronic kidney disease, stage 2 (mild): Secondary | ICD-10-CM | POA: Diagnosis not present

## 2015-09-30 DIAGNOSIS — B20 Human immunodeficiency virus [HIV] disease: Secondary | ICD-10-CM | POA: Diagnosis not present

## 2015-10-21 DIAGNOSIS — I340 Nonrheumatic mitral (valve) insufficiency: Secondary | ICD-10-CM | POA: Diagnosis not present

## 2015-10-21 DIAGNOSIS — R079 Chest pain, unspecified: Secondary | ICD-10-CM | POA: Diagnosis not present

## 2015-10-21 DIAGNOSIS — I2510 Atherosclerotic heart disease of native coronary artery without angina pectoris: Secondary | ICD-10-CM | POA: Diagnosis not present

## 2015-10-21 DIAGNOSIS — E785 Hyperlipidemia, unspecified: Secondary | ICD-10-CM | POA: Diagnosis not present

## 2015-10-21 DIAGNOSIS — K219 Gastro-esophageal reflux disease without esophagitis: Secondary | ICD-10-CM | POA: Diagnosis not present

## 2015-10-21 DIAGNOSIS — I10 Essential (primary) hypertension: Secondary | ICD-10-CM | POA: Diagnosis not present

## 2015-10-23 DIAGNOSIS — J449 Chronic obstructive pulmonary disease, unspecified: Secondary | ICD-10-CM | POA: Diagnosis not present

## 2015-10-23 DIAGNOSIS — R0602 Shortness of breath: Secondary | ICD-10-CM | POA: Diagnosis not present

## 2015-11-03 DIAGNOSIS — Z21 Asymptomatic human immunodeficiency virus [HIV] infection status: Secondary | ICD-10-CM | POA: Diagnosis not present

## 2015-11-03 DIAGNOSIS — E7800 Pure hypercholesterolemia, unspecified: Secondary | ICD-10-CM | POA: Diagnosis not present

## 2015-11-03 DIAGNOSIS — J449 Chronic obstructive pulmonary disease, unspecified: Secondary | ICD-10-CM | POA: Diagnosis not present

## 2015-11-03 DIAGNOSIS — E1122 Type 2 diabetes mellitus with diabetic chronic kidney disease: Secondary | ICD-10-CM | POA: Diagnosis not present

## 2015-11-03 DIAGNOSIS — N182 Chronic kidney disease, stage 2 (mild): Secondary | ICD-10-CM | POA: Diagnosis not present

## 2015-11-03 DIAGNOSIS — I10 Essential (primary) hypertension: Secondary | ICD-10-CM | POA: Diagnosis not present

## 2015-11-03 DIAGNOSIS — I2510 Atherosclerotic heart disease of native coronary artery without angina pectoris: Secondary | ICD-10-CM | POA: Diagnosis not present

## 2015-11-20 DIAGNOSIS — Z21 Asymptomatic human immunodeficiency virus [HIV] infection status: Secondary | ICD-10-CM | POA: Diagnosis not present

## 2015-12-17 DIAGNOSIS — R0602 Shortness of breath: Secondary | ICD-10-CM | POA: Diagnosis not present

## 2015-12-29 DIAGNOSIS — Z0001 Encounter for general adult medical examination with abnormal findings: Secondary | ICD-10-CM | POA: Diagnosis not present

## 2015-12-29 DIAGNOSIS — Z125 Encounter for screening for malignant neoplasm of prostate: Secondary | ICD-10-CM | POA: Diagnosis not present

## 2015-12-29 DIAGNOSIS — E782 Mixed hyperlipidemia: Secondary | ICD-10-CM | POA: Diagnosis not present

## 2015-12-29 DIAGNOSIS — I10 Essential (primary) hypertension: Secondary | ICD-10-CM | POA: Diagnosis not present

## 2015-12-29 DIAGNOSIS — Z1211 Encounter for screening for malignant neoplasm of colon: Secondary | ICD-10-CM | POA: Diagnosis not present

## 2016-01-13 ENCOUNTER — Emergency Department: Payer: Medicare Other

## 2016-01-13 ENCOUNTER — Observation Stay
Admission: EM | Admit: 2016-01-13 | Discharge: 2016-01-16 | Disposition: A | Discharge status: Home / Self Care | Payer: Medicare Other | Attending: Internal Medicine | Admitting: Internal Medicine

## 2016-01-13 DIAGNOSIS — E119 Type 2 diabetes mellitus without complications: Secondary | ICD-10-CM | POA: Diagnosis not present

## 2016-01-13 DIAGNOSIS — Z87891 Personal history of nicotine dependence: Secondary | ICD-10-CM | POA: Diagnosis not present

## 2016-01-13 DIAGNOSIS — Z9181 History of falling: Secondary | ICD-10-CM | POA: Diagnosis not present

## 2016-01-13 DIAGNOSIS — E882 Lipomatosis, not elsewhere classified: Secondary | ICD-10-CM | POA: Diagnosis not present

## 2016-01-13 DIAGNOSIS — R609 Edema, unspecified: Secondary | ICD-10-CM | POA: Diagnosis not present

## 2016-01-13 DIAGNOSIS — M50221 Other cervical disc displacement at C4-C5 level: Secondary | ICD-10-CM | POA: Insufficient documentation

## 2016-01-13 DIAGNOSIS — J449 Chronic obstructive pulmonary disease, unspecified: Secondary | ICD-10-CM | POA: Insufficient documentation

## 2016-01-13 DIAGNOSIS — I739 Peripheral vascular disease, unspecified: Secondary | ICD-10-CM | POA: Insufficient documentation

## 2016-01-13 DIAGNOSIS — M4806 Spinal stenosis, lumbar region: Secondary | ICD-10-CM | POA: Insufficient documentation

## 2016-01-13 DIAGNOSIS — Z794 Long term (current) use of insulin: Secondary | ICD-10-CM | POA: Insufficient documentation

## 2016-01-13 DIAGNOSIS — Z886 Allergy status to analgesic agent status: Secondary | ICD-10-CM | POA: Insufficient documentation

## 2016-01-13 DIAGNOSIS — R29898 Other symptoms and signs involving the musculoskeletal system: Secondary | ICD-10-CM | POA: Diagnosis present

## 2016-01-13 DIAGNOSIS — K219 Gastro-esophageal reflux disease without esophagitis: Secondary | ICD-10-CM | POA: Insufficient documentation

## 2016-01-13 DIAGNOSIS — Z880 Allergy status to penicillin: Secondary | ICD-10-CM | POA: Diagnosis not present

## 2016-01-13 DIAGNOSIS — Z21 Asymptomatic human immunodeficiency virus [HIV] infection status: Secondary | ICD-10-CM | POA: Insufficient documentation

## 2016-01-13 DIAGNOSIS — M79605 Pain in left leg: Secondary | ICD-10-CM | POA: Diagnosis not present

## 2016-01-13 DIAGNOSIS — G729 Myopathy, unspecified: Secondary | ICD-10-CM | POA: Insufficient documentation

## 2016-01-13 DIAGNOSIS — Z79899 Other long term (current) drug therapy: Secondary | ICD-10-CM | POA: Diagnosis not present

## 2016-01-13 DIAGNOSIS — I2510 Atherosclerotic heart disease of native coronary artery without angina pectoris: Secondary | ICD-10-CM | POA: Diagnosis not present

## 2016-01-13 DIAGNOSIS — M50222 Other cervical disc displacement at C5-C6 level: Secondary | ICD-10-CM | POA: Insufficient documentation

## 2016-01-13 DIAGNOSIS — Z91030 Bee allergy status: Secondary | ICD-10-CM | POA: Diagnosis not present

## 2016-01-13 DIAGNOSIS — W19XXXA Unspecified fall, initial encounter: Secondary | ICD-10-CM | POA: Diagnosis not present

## 2016-01-13 DIAGNOSIS — Z888 Allergy status to other drugs, medicaments and biological substances status: Secondary | ICD-10-CM | POA: Diagnosis not present

## 2016-01-13 DIAGNOSIS — R262 Difficulty in walking, not elsewhere classified: Secondary | ICD-10-CM | POA: Insufficient documentation

## 2016-01-13 DIAGNOSIS — I10 Essential (primary) hypertension: Secondary | ICD-10-CM | POA: Diagnosis not present

## 2016-01-13 DIAGNOSIS — I252 Old myocardial infarction: Secondary | ICD-10-CM | POA: Insufficient documentation

## 2016-01-13 DIAGNOSIS — Z8249 Family history of ischemic heart disease and other diseases of the circulatory system: Secondary | ICD-10-CM | POA: Diagnosis not present

## 2016-01-13 DIAGNOSIS — R2689 Other abnormalities of gait and mobility: Secondary | ICD-10-CM | POA: Diagnosis not present

## 2016-01-13 DIAGNOSIS — S8010XA Contusion of unspecified lower leg, initial encounter: Secondary | ICD-10-CM | POA: Insufficient documentation

## 2016-01-13 DIAGNOSIS — M5136 Other intervertebral disc degeneration, lumbar region: Secondary | ICD-10-CM | POA: Insufficient documentation

## 2016-01-13 DIAGNOSIS — Z882 Allergy status to sulfonamides status: Secondary | ICD-10-CM | POA: Insufficient documentation

## 2016-01-13 DIAGNOSIS — M5127 Other intervertebral disc displacement, lumbosacral region: Secondary | ICD-10-CM | POA: Insufficient documentation

## 2016-01-13 DIAGNOSIS — M79604 Pain in right leg: Secondary | ICD-10-CM | POA: Diagnosis not present

## 2016-01-13 DIAGNOSIS — M50223 Other cervical disc displacement at C6-C7 level: Secondary | ICD-10-CM | POA: Diagnosis not present

## 2016-01-13 DIAGNOSIS — R531 Weakness: Secondary | ICD-10-CM | POA: Diagnosis not present

## 2016-01-13 DIAGNOSIS — G7289 Other specified myopathies: Principal | ICD-10-CM | POA: Insufficient documentation

## 2016-01-13 DIAGNOSIS — M6281 Muscle weakness (generalized): Secondary | ICD-10-CM | POA: Diagnosis not present

## 2016-01-13 LAB — VITAMIN B12: Vitamin B-12: 133 pg/mL — ABNORMAL LOW (ref 180–914)

## 2016-01-13 LAB — HEPATIC FUNCTION PANEL
ALBUMIN: 2.8 g/dL — AB (ref 3.5–5.0)
ALK PHOS: 75 U/L (ref 38–126)
ALT: 56 U/L (ref 17–63)
AST: 36 U/L (ref 15–41)
BILIRUBIN TOTAL: 0.3 mg/dL (ref 0.3–1.2)
Bilirubin, Direct: <0.1 mg/dL — ABNORMAL LOW (ref 0.1–0.5)
TOTAL PROTEIN: 6.4 g/dL — AB (ref 6.5–8.1)

## 2016-01-13 LAB — CBC WITH DIFFERENTIAL/PLATELET
BASOS PCT: 0 %
BLASTS: 0 %
Band Neutrophils: 1 %
Basophils Absolute: 0 10*3/uL (ref 0–0.1)
EOS ABS: 0 10*3/uL (ref 0–0.7)
EOS PCT: 0 %
HEMATOCRIT: 34.8 % — AB (ref 40.0–52.0)
Hemoglobin: 12 g/dL — ABNORMAL LOW (ref 13.0–18.0)
Lymphocytes Relative: 8 %
Lymphs Abs: 0.6 10*3/uL — ABNORMAL LOW (ref 1.0–3.6)
MCH: 34.5 pg — ABNORMAL HIGH (ref 26.0–34.0)
MCHC: 34.5 g/dL (ref 32.0–36.0)
MCV: 100 fL (ref 80.0–100.0)
MONOS PCT: 2 %
Metamyelocytes Relative: 1 %
Monocytes Absolute: 0.2 10*3/uL (ref 0.2–1.0)
Myelocytes: 0 %
NRBC: 0 /100{WBCs}
Neutro Abs: 7.1 10*3/uL — ABNORMAL HIGH (ref 1.4–6.5)
Neutrophils Relative %: 88 %
OTHER: 0 %
PROMYELOCYTES ABS: 0 %
Platelets: 191 10*3/uL (ref 150–440)
RBC: 3.48 MIL/uL — ABNORMAL LOW (ref 4.40–5.90)
RDW: 14.1 % (ref 11.5–14.5)
WBC: 7.9 10*3/uL (ref 3.8–10.6)

## 2016-01-13 LAB — BASIC METABOLIC PANEL
ANION GAP: 8 (ref 5–15)
BUN: 33 mg/dL — ABNORMAL HIGH (ref 6–20)
CALCIUM: 8.4 mg/dL — AB (ref 8.9–10.3)
CO2: 20 mmol/L — ABNORMAL LOW (ref 22–32)
Chloride: 111 mmol/L (ref 101–111)
Creatinine, Ser: 1.17 mg/dL (ref 0.61–1.24)
GFR calc Af Amer: >60 mL/min (ref >60)
GLUCOSE: 177 mg/dL — AB (ref 65–99)
POTASSIUM: 4.5 mmol/L (ref 3.5–5.1)
SODIUM: 139 mmol/L (ref 135–145)

## 2016-01-13 LAB — URINALYSIS COMPLETE WITH MICROSCOPIC (ARMC ONLY)
BACTERIA UA: NONE SEEN
BILIRUBIN URINE: NEGATIVE
Glucose, UA: 150 mg/dL — AB
KETONES UR: NEGATIVE mg/dL
LEUKOCYTES UA: NEGATIVE
NITRITE: NEGATIVE
Protein, ur: 100 mg/dL — AB
SPECIFIC GRAVITY, URINE: 1.023 (ref 1.005–1.030)
pH: 6 (ref 5.0–8.0)

## 2016-01-13 LAB — CBC
HEMATOCRIT: 34.7 % — AB (ref 40.0–52.0)
HEMOGLOBIN: 12.1 g/dL — AB (ref 13.0–18.0)
MCH: 34.7 pg — ABNORMAL HIGH (ref 26.0–34.0)
MCHC: 34.9 g/dL (ref 32.0–36.0)
MCV: 99.3 fL (ref 80.0–100.0)
Platelets: 189 10*3/uL (ref 150–440)
RBC: 3.5 MIL/uL — ABNORMAL LOW (ref 4.40–5.90)
RDW: 14.1 % (ref 11.5–14.5)
WBC: 7.9 10*3/uL (ref 3.8–10.6)

## 2016-01-13 LAB — GLUCOSE, CAPILLARY: Glucose-Capillary: 185 mg/dL — ABNORMAL HIGH (ref 65–99)

## 2016-01-13 LAB — CK: Total CK: 377 U/L (ref 49–397)

## 2016-01-13 MED ORDER — ENOXAPARIN SODIUM 40 MG/0.4ML ~~LOC~~ SOLN
40.0000 mg | SUBCUTANEOUS | Status: DC
  Administered 2016-01-13 – 2016-01-15 (×3): 40 mg via SUBCUTANEOUS
  Filled 2016-01-13 (×3): qty 0.4

## 2016-01-13 MED ORDER — ESCITALOPRAM OXALATE 10 MG PO TABS
20.0000 mg | ORAL_TABLET | Freq: Every day | ORAL | Status: DC
  Administered 2016-01-14 – 2016-01-16 (×3): 20 mg via ORAL
  Filled 2016-01-13 (×3): qty 2

## 2016-01-13 MED ORDER — FLUCONAZOLE 100 MG PO TABS
200.0000 mg | ORAL_TABLET | Freq: Every day | ORAL | Status: DC
  Administered 2016-01-14 – 2016-01-16 (×3): 200 mg via ORAL
  Filled 2016-01-13: qty 2
  Filled 2016-01-13 (×2): qty 1
  Filled 2016-01-13: qty 2
  Filled 2016-01-13: qty 1

## 2016-01-13 MED ORDER — GEMFIBROZIL 600 MG PO TABS
600.0000 mg | ORAL_TABLET | Freq: Two times a day (BID) | ORAL | Status: DC
  Administered 2016-01-14: 600 mg via ORAL
  Filled 2016-01-13: qty 1

## 2016-01-13 MED ORDER — EPINEPHRINE 0.3 MG/0.3ML IJ SOAJ
0.3000 mg | Freq: Once | INTRAMUSCULAR | Status: DC | PRN
  Filled 2016-01-13: qty 0.3

## 2016-01-13 MED ORDER — MIRTAZAPINE 15 MG PO TABS
30.0000 mg | ORAL_TABLET | Freq: Every day | ORAL | Status: DC
Start: 2016-01-13 — End: 2016-01-16
  Administered 2016-01-13 – 2016-01-15 (×3): 30 mg via ORAL
  Filled 2016-01-13 (×3): qty 2

## 2016-01-13 MED ORDER — CARVEDILOL 6.25 MG PO TABS
ORAL_TABLET | ORAL | Status: AC
  Administered 2016-01-13: 12.5 mg via ORAL
  Filled 2016-01-13: qty 1

## 2016-01-13 MED ORDER — LINACLOTIDE 290 MCG PO CAPS
290.0000 ug | ORAL_CAPSULE | Freq: Every day | ORAL | Status: DC
  Administered 2016-01-14 – 2016-01-16 (×3): 290 ug via ORAL
  Filled 2016-01-13 (×3): qty 1

## 2016-01-13 MED ORDER — SODIUM CHLORIDE 0.9 % IV SOLN
INTRAVENOUS | Status: AC
  Administered 2016-01-13 – 2016-01-14 (×2): via INTRAVENOUS

## 2016-01-13 MED ORDER — TRAMADOL HCL 50 MG PO TABS
50.0000 mg | ORAL_TABLET | Freq: Two times a day (BID) | ORAL | Status: DC
  Administered 2016-01-13 – 2016-01-15 (×4): 50 mg via ORAL
  Filled 2016-01-13 (×4): qty 1

## 2016-01-13 MED ORDER — MOMETASONE FURO-FORMOTEROL FUM 200-5 MCG/ACT IN AERO
2.0000 | INHALATION_SPRAY | Freq: Two times a day (BID) | RESPIRATORY_TRACT | Status: DC
  Administered 2016-01-13 – 2016-01-16 (×6): 2 via RESPIRATORY_TRACT
  Filled 2016-01-13: qty 8.8

## 2016-01-13 MED ORDER — ACETAMINOPHEN 650 MG RE SUPP: 650.0000 mg | Freq: Four times a day (QID) | RECTAL | Status: DC | PRN

## 2016-01-13 MED ORDER — CLONAZEPAM 0.5 MG PO TABS: 0.5000 mg | ORAL_TABLET | Freq: Two times a day (BID) | ORAL | Status: DC | PRN

## 2016-01-13 MED ORDER — ATORVASTATIN CALCIUM 20 MG PO TABS
40.0000 mg | ORAL_TABLET | Freq: Every day | ORAL | Status: DC
  Administered 2016-01-13: 40 mg via ORAL
  Filled 2016-01-13: qty 2

## 2016-01-13 MED ORDER — TIOTROPIUM BROMIDE MONOHYDRATE 18 MCG IN CAPS
18.0000 ug | ORAL_CAPSULE | Freq: Every day | RESPIRATORY_TRACT | Status: DC
  Administered 2016-01-14 – 2016-01-16 (×3): 18 ug via RESPIRATORY_TRACT
  Filled 2016-01-13: qty 5

## 2016-01-13 MED ORDER — INSULIN ASPART 100 UNIT/ML ~~LOC~~ SOLN
0.0000 [IU] | Freq: Every day | SUBCUTANEOUS | Status: DC
  Administered 2016-01-14: 3 [IU] via SUBCUTANEOUS
  Administered 2016-01-15: 2 [IU] via SUBCUTANEOUS
  Filled 2016-01-13: qty 2
  Filled 2016-01-13: qty 3

## 2016-01-13 MED ORDER — ONDANSETRON HCL 4 MG PO TABS: 4.0000 mg | ORAL_TABLET | Freq: Four times a day (QID) | ORAL | Status: DC | PRN

## 2016-01-13 MED ORDER — ISOSORBIDE MONONITRATE ER 30 MG PO TB24
30.0000 mg | ORAL_TABLET | Freq: Every day | ORAL | Status: DC
  Administered 2016-01-14 – 2016-01-16 (×3): 30 mg via ORAL
  Filled 2016-01-13 (×3): qty 1

## 2016-01-13 MED ORDER — ELVITEG-COBIC-EMTRICIT-TENOFAF 150-150-200-10 MG PO TABS
1.0000 | ORAL_TABLET | Freq: Every day | ORAL | Status: DC
  Administered 2016-01-13 – 2016-01-15 (×3): 1 via ORAL
  Filled 2016-01-13 (×4): qty 1

## 2016-01-13 MED ORDER — INSULIN ASPART 100 UNIT/ML ~~LOC~~ SOLN
0.0000 [IU] | Freq: Three times a day (TID) | SUBCUTANEOUS | Status: DC
  Administered 2016-01-14: 2 [IU] via SUBCUTANEOUS
  Administered 2016-01-14: 3 [IU] via SUBCUTANEOUS
  Administered 2016-01-14: 2 [IU] via SUBCUTANEOUS
  Administered 2016-01-15 (×2): 3 [IU] via SUBCUTANEOUS
  Administered 2016-01-15: 2 [IU] via SUBCUTANEOUS
  Administered 2016-01-16: 3 [IU] via SUBCUTANEOUS
  Administered 2016-01-16: 2 [IU] via SUBCUTANEOUS
  Filled 2016-01-13: qty 3
  Filled 2016-01-13 (×2): qty 2
  Filled 2016-01-13: qty 3
  Filled 2016-01-13: qty 2
  Filled 2016-01-13 (×2): qty 3
  Filled 2016-01-13: qty 2

## 2016-01-13 MED ORDER — ZOLPIDEM TARTRATE 5 MG PO TABS: 5.0000 mg | ORAL_TABLET | Freq: Every evening | ORAL | Status: DC | PRN

## 2016-01-13 MED ORDER — LORATADINE 10 MG PO TABS
10.0000 mg | ORAL_TABLET | Freq: Every day | ORAL | Status: DC
  Administered 2016-01-14 – 2016-01-16 (×3): 10 mg via ORAL
  Filled 2016-01-13 (×3): qty 1

## 2016-01-13 MED ORDER — ALBUTEROL SULFATE (2.5 MG/3ML) 0.083% IN NEBU: 2.5000 mg | INHALATION_SOLUTION | RESPIRATORY_TRACT | Status: DC | PRN

## 2016-01-13 MED ORDER — FLUTICASONE PROPIONATE 50 MCG/ACT NA SUSP
1.0000 | Freq: Every day | NASAL | Status: DC
  Administered 2016-01-14 – 2016-01-16 (×2): 1 via NASAL
  Filled 2016-01-13: qty 16

## 2016-01-13 MED ORDER — HYDROCODONE-ACETAMINOPHEN 5-325 MG PO TABS
1.0000 | ORAL_TABLET | ORAL | Status: DC | PRN
  Administered 2016-01-14: 1 via ORAL
  Filled 2016-01-13: qty 1

## 2016-01-13 MED ORDER — PANTOPRAZOLE SODIUM 40 MG PO TBEC
40.0000 mg | DELAYED_RELEASE_TABLET | Freq: Every day | ORAL | Status: DC
  Administered 2016-01-14 – 2016-01-16 (×3): 40 mg via ORAL
  Filled 2016-01-13 (×3): qty 1

## 2016-01-13 MED ORDER — NITROGLYCERIN 0.4 MG SL SUBL: 0.4000 mg | SUBLINGUAL_TABLET | SUBLINGUAL | Status: DC | PRN

## 2016-01-13 MED ORDER — SENNOSIDES-DOCUSATE SODIUM 8.6-50 MG PO TABS: 1.0000 | ORAL_TABLET | Freq: Every evening | ORAL | Status: DC | PRN

## 2016-01-13 MED ORDER — ONDANSETRON HCL 4 MG/2ML IJ SOLN: 4.0000 mg | Freq: Four times a day (QID) | INTRAMUSCULAR | Status: DC | PRN

## 2016-01-13 MED ORDER — CARVEDILOL 6.25 MG PO TABS
12.5000 mg | ORAL_TABLET | Freq: Two times a day (BID) | ORAL | Status: DC
  Administered 2016-01-13 – 2016-01-16 (×6): 12.5 mg via ORAL
  Filled 2016-01-13 (×5): qty 2

## 2016-01-13 MED ORDER — ACETAMINOPHEN 325 MG PO TABS: 650.0000 mg | ORAL_TABLET | Freq: Four times a day (QID) | ORAL | Status: DC | PRN

## 2016-01-13 MED ORDER — MORPHINE SULFATE (PF) 2 MG/ML IV SOLN: 2.0000 mg | INTRAVENOUS | Status: DC | PRN

## 2016-01-13 NOTE — ED Notes (Signed)
Patient transported to MRI 

## 2016-01-13 NOTE — Plan of Care (Signed)
Problem: Education: Goal: Knowledge of [facility] Education information/materials will improve Outcome: Progressing Pt likes to be called Brian Hampton    Past Medical History   Diagnosis  Date   .  Asthma     .  COPD (chronic obstructive pulmonary disease) ([facility])     .  Coronary artery disease     .  Lung mass     .  Renal disorder     .  Emphysema/COPD ([facility])     .  Myocardial infarction ([facility])         in 2000   .  Diabetes mellitus without complication ([facility])            Pt is well controlled with home medications

## 2016-01-13 NOTE — ED Notes (Signed)
Pt transported to room 149 

## 2016-01-13 NOTE — ED Notes (Signed)
Pt bib EMS w/ c/o fall and incr weakness.  Pt sts that he has increasing leg weakness over past week, sts that he fell twice yesterday and once today.  Pt denies LOC, dizziness or head injury.  Pt sts "my legs just gave", sts that he is normally ambulatory.  Denies CP, n/v/d, SOB or weakness in arms.  A/OX4. NAD.

## 2016-01-13 NOTE — ED Provider Notes (Signed)
[facility] Emergency Department Provider Note   ____________________________________________  Time seen: Approximately 4:02 PM  I have reviewed the triage vital signs and the nursing notes.   HISTORY  Chief Complaint Fall    HPI Brian Hampton is a 60 y.o. male patient reports tremor for about 2 weeks. For the last week his legs have felt weak and he has been falling. He said he used to take a lot of B12 and iron medicine but his doctors taken off of most of that recently. When he gets up and sits down his legs hurt he says the pain goes all the way down his legs or his back does not hurt. He is not incontinent. When he walks he is very unsteady. He has fallen repeatedly in the last few days. No other modifying factors or associated symptoms.  Past Medical History  Diagnosis Date  . Asthma   . COPD (chronic obstructive pulmonary disease) ([place])   . Coronary artery disease   . Lung mass   . Renal disorder   . Emphysema/COPD ([place])   . Myocardial infarction ([place])     in 2000  . Diabetes mellitus without complication [facility])     Patient Active Problem List   Diagnosis Date Noted  . Leg weakness, bilateral 01/13/2016  . Chest pain 06/24/2015    History reviewed. No pertinent past surgical history.  No current outpatient prescriptions on file.  Allergies Aspirin; Bee venom; Penicillins; Prednisone; Sulfa antibiotics; Sulfasalazine; and Theophyllines  Family History  Problem Relation Age of Onset  . CAD      Social History Social History  Substance Use Topics  . Smoking status: Former Research scientist (life sciences)  . Smokeless tobacco: Never Used  . Alcohol Use: No    Review of Systems Constitutional: No fever/chills Eyes: No visual changes. ENT: No sore throat. Cardiovascular: Denies chest pain. Respiratory: Denies shortness of breath. Gastrointestinal: No abdominal pain.  No nausea, no vomiting.  No diarrhea.  No constipation. Genitourinary: Negative for  dysuria. Musculoskeletal: Negative for back pain. Skin: Negative for rash. Neurological: Negative for headaches,  or numbness.  10-point ROS otherwise negative.  ____________________________________________   PHYSICAL EXAM:  VITAL SIGNS: ED Triage Vitals  Enc Vitals Group     BP 01/13/16 1401 150/96 mmHg     Pulse Rate 01/13/16 1401 86     Resp 01/13/16 1401 20     Temp 01/13/16 1401 98.2 F (36.8 C)     Temp Source 01/13/16 1401 Oral     SpO2 01/13/16 1401 97 %     Weight 01/13/16 1401 160 lb (72.576 kg)     Height 01/13/16 1401 '5\' 6"'$  (1.676 m)     Head Cir --      Peak Flow --      Pain Score 01/13/16 1401 0     Pain Loc --      Pain Edu? --      Excl. in [place]? --     Constitutional: Alert and oriented. Well appearing and in no acute distress. Eyes: Conjunctivae are normal. PERRL. EOMI. Head: Atraumatic. Nose: No congestion/rhinnorhea. Mouth/Throat: Mucous membranes are moist.  Oropharynx non-erythematous. Neck: No stridor.   Cardiovascular: Normal rate, regular rhythm. Grossly normal heart sounds.  Good peripheral circulation. Respiratory: Normal respiratory effort.  No retractions. Lungs CTAB. Gastrointestinal: Soft and nontender. No distention. No abdominal bruits. No CVA tenderness. Musculoskeletal: No lower extremity tenderness nor edema.  No joint effusions. Neurologic:  Normal speech and language.  Cranial nerves II through XII are intact. Cerebellar finger to nose is intact. Motor strength is 5 over 5 in the arm seems to be somewhat less in the legs. DTRs 1+ in both knees but there is clonus in both feet. Patient was stood to walk and is very unsteady on his feet even with 2 people holding him up. He turns in a wide broad-based gait. He almost falls. Skin:  Skin is warm, dry and intact. No rash noted. Psychiatric: Mood and affect are normal. Speech and behavior are normal.  ____________________________________________   LABS (all labs ordered are listed, but  only abnormal results are displayed)  Labs Reviewed  BASIC METABOLIC PANEL - Abnormal; Notable for the following:    CO2 20 (*)    Glucose, Bld 177 (*)    BUN 33 (*)    Calcium 8.4 (*)    All other components within normal limits  CBC - Abnormal; Notable for the following:    RBC 3.50 (*)    Hemoglobin 12.1 (*)    HCT 34.7 (*)    MCH 34.7 (*)    All other components within normal limits  URINALYSIS COMPLETEWITH MICROSCOPIC ([facility] ONLY) - Abnormal; Notable for the following:    Color, Urine YELLOW (*)    APPearance CLEAR (*)    Glucose, UA 150 (*)    Hgb urine dipstick 2+ (*)    Protein, ur 100 (*)    Squamous Epithelial / LPF 0-5 (*)    All other components within normal limits  VITAMIN B12 - Abnormal; Notable for the following:    Vitamin B-12 133 (*)    All other components within normal limits  FOLATE RBC - Abnormal; Notable for the following:    Hematocrit 34.3 (*)    All other components within normal limits  HEPATIC FUNCTION PANEL - Abnormal; Notable for the following:    Total Protein 6.4 (*)    Albumin 2.8 (*)    Bilirubin, Direct <0.1 (*)    All other components within normal limits  CBC WITH DIFFERENTIAL/PLATELET - Abnormal; Notable for the following:    RBC 3.48 (*)    Hemoglobin 12.0 (*)    HCT 34.8 (*)    MCH 34.5 (*)    Neutro Abs 7.1 (*)    Lymphs Abs 0.6 (*)    All other components within normal limits  BASIC METABOLIC PANEL - Abnormal; Notable for the following:    Glucose, Bld 206 (*)    BUN 30 (*)    Calcium 8.2 (*)    All other components within normal limits  GLUCOSE, CAPILLARY - Abnormal; Notable for the following:    Glucose-Capillary 185 (*)    All other components within normal limits  GLUCOSE, CAPILLARY - Abnormal; Notable for the following:    Glucose-Capillary 162 (*)    All other components within normal limits  GLUCOSE, CAPILLARY - Abnormal; Notable for the following:    Glucose-Capillary 167 (*)    All other components within  normal limits  SEDIMENTATION RATE - Abnormal; Notable for the following:    Sed Rate 56 (*)    All other components within normal limits  RAPID HIV SCREEN (HIV 1/2 AB+AG) - Abnormal; Notable for the following:    HIV 1/2 Antibodies Reactive (*)    All other components within normal limits  APTT - Abnormal; Notable for the following:    aPTT <24 (*)    All other components within normal limits  TSH -  Abnormal; Notable for the following:    TSH 0.092 (*)    All other components within normal limits  GLUCOSE, CAPILLARY - Abnormal; Notable for the following:    Glucose-Capillary 218 (*)    All other components within normal limits  HEMOGLOBIN A1C - Abnormal; Notable for the following:    Hgb A1c MFr Bld 6.2 (*)    All other components within normal limits  GLUCOSE, CAPILLARY - Abnormal; Notable for the following:    Glucose-Capillary 272 (*)    All other components within normal limits  T4, FREE - Abnormal; Notable for the following:    Free T4 1.25 (*)    All other components within normal limits  GLUCOSE, CAPILLARY - Abnormal; Notable for the following:    Glucose-Capillary 180 (*)    All other components within normal limits  GLUCOSE, CAPILLARY - Abnormal; Notable for the following:    Glucose-Capillary 207 (*)    All other components within normal limits  CSF CELL COUNT WITH DIFFERENTIAL - Abnormal; Notable for the following:    Appearance, CSF CLEAR (*)    All other components within normal limits  PROTEIN AND GLUCOSE, CSF - Abnormal; Notable for the following:    Glucose, CSF 104 (*)    All other components within normal limits  GLUCOSE, CAPILLARY - Abnormal; Notable for the following:    Glucose-Capillary 219 (*)    All other components within normal limits  CSF CULTURE  CULTURE, FUNGUS WITHOUT SMEAR  ([facility]-ONLY)  ACID FAST SMEAR ([facility])  ACID FAST CULTURE WITH REFLEXED SENSITIVITIES  CK  CK  C-REACTIVE PROTEIN  PROTIME-INR  HELPER T-LYMPH-CD4 ([facility] ONLY)  HEAVY  METALS, BLOOD  ANA W/REFLEX IF POSITIVE  CERULOPLASMIN  RPR  HIV 1/2 AB - DIFFERENTIATION  HLA B*5701  VDRL, CSF  CBG MONITORING, ED   ____________________________________________  EKG  EKG read and interpreted by me shows normal sinus rhythm rate of 87 normal axis no acute ST-T wave changes ____________________________________________  RADIOLOGY MRI of the T and L-spine show no acute disease. ____________________________________________   PROCEDURES  Discussed with Dr. Doy Mince neurology who recommends I do a MRI of the T and L-spine. She recommends that if these are negative the patient be admitted for further workup including MRI of the head.  ____________________________________________   INITIAL IMPRESSION / ASSESSMENT AND PLAN / ED COURSE  Pertinent labs & imaging results that were available during my care of the patient were reviewed by me and considered in my medical decision making (see chart for details).   ____________________________________________   FINAL CLINICAL IMPRESSION(S) / ED DIAGNOSES  Final diagnoses:  Weakness  Leg weakness      NEW MEDICATIONS STARTED DURING THIS VISIT:  Current Discharge Medication List       Note:  This document was prepared using Dragon voice recognition software and may include unintentional dictation errors.    Nena Polio, MD 01/15/16 [phone]

## 2016-01-13 NOTE — H&P (Signed)
[facility] [place] [facility]   PATIENT NAME: Brian Hampton    MR#:  [id number]  DATE OF BIRTH:  10-02-1955  DATE OF ADMISSION:  01/13/2016  PRIMARY CARE PHYSICIAN: [facility]   REQUESTING/REFERRING PHYSICIAN: Arnaldo Natal, MD  CHIEF COMPLAINT:   Chief Complaint  Patient presents with  . Fall   Bilateral lower extremity weakness and fall. HISTORY OF PRESENT ILLNESS:  Brian Hampton  is a 60 y.o. male with a known history of COPD, CAD, hypertension diabetes. The patient to present to the ED with the bilateral lower extremity weakness and pain for 2 weeks. He denies any headache or dizziness, no dysphagia, slurred speech or incontinence. But  He does have unsteady gait. He fell repeatedly in the past few days.he complains back pain but he denies any other symptoms. MRI of lumbar spine shows There is mild left L5 neural foraminal stenosis. Epidural lipomatosis, maximal at L5-S1. Neurologist, Dr. Jodi Mourning suggested MRI of the brain.  PAST MEDICAL HISTORY:   Past Medical History  Diagnosis Date  . Asthma   . COPD (chronic obstructive pulmonary disease) (HCC)   . Coronary artery disease   . Lung mass   . Renal disorder   . Emphysema/COPD (HCC)   . Myocardial infarction (HCC)     in 2000  . Diabetes mellitus without complication (HCC)     PAST SURGICAL HISTORY:  History reviewed. No pertinent past surgical history.  SOCIAL HISTORY:   Social History  Substance Use Topics  . Smoking status: Former Games developer  . Smokeless tobacco: Never Used  . Alcohol Use: No    FAMILY HISTORY:   Family History  Problem Relation Age of Onset  . CAD      DRUG ALLERGIES:   Allergies  Allergen Reactions  . Aspirin Anaphylaxis  . Bee Venom Anaphylaxis  . Penicillins Anaphylaxis and Other (See Comments)    Has patient had a PCN reaction causing immediate rash, facial/tongue/throat swelling, SOB or lightheadedness with hypotension: Yes Has patient  had a PCN reaction causing severe rash involving mucus membranes or skin necrosis: No Has patient had a PCN reaction that required hospitalization No Has patient had a PCN reaction occurring within the last 10 years: Yes If all of the above answers are "NO", then may proceed with Cephalosporin use.  . Prednisone Anaphylaxis  . Sulfa Antibiotics Anaphylaxis  . Sulfasalazine Anaphylaxis  . Theophyllines Anaphylaxis    REVIEW OF SYSTEMS:  CONSTITUTIONAL: No fever, fatigue or weakness.  EYES: No blurred or double vision.  EARS, NOSE, AND THROAT: No tinnitus or ear pain.  RESPIRATORY: No cough, shortness of breath, wheezing or hemoptysis.  CARDIOVASCULAR: No chest pain, orthopnea, edema.  GASTROINTESTINAL: No nausea, vomiting, diarrhea or abdominal pain.  GENITOURINARY: No dysuria, hematuria.  ENDOCRINE: No polyuria, nocturia,  HEMATOLOGY: No anemia, easy bruising or bleeding SKIN: No rash or lesion. MUSCULOSKELETAL: No joint pain or arthritis.  But has bilateral leg weakness. NEUROLOGIC: No tingling, but has decreased sensation,numbness and weakness of bilateral legs.  PSYCHIATRY: No anxiety or depression.   MEDICATIONS AT HOME:   Prior to Admission medications   Medication Sig Start Date End Date Taking? Authorizing Provider  albuterol (PROVENTIL HFA;VENTOLIN HFA) 108 (90 BASE) MCG/ACT inhaler Inhale 2 puffs into the lungs every 6 (six) hours as needed for wheezing or shortness of breath.   Yes Historical Provider, MD  albuterol (PROVENTIL) (2.5 MG/3ML) 0.083% nebulizer solution Take 2.5 mg by nebulization every 6 (six)  hours as needed for wheezing or shortness of breath.   Yes Historical Provider, MD  atorvastatin (LIPITOR) 40 MG tablet Take 40 mg by mouth at bedtime.   Yes Historical Provider, MD  Calcium Carbonate-Vitamin D (CALCIUM 600+D) 600-400 MG-UNIT tablet Take 1 tablet by mouth 2 (two) times daily.   Yes Historical Provider, MD  carvedilol (COREG) 12.5 MG tablet Take 12.5 mg  by mouth 2 (two) times daily with a meal.   Yes Historical Provider, MD  cetirizine (ZYRTEC) 10 MG tablet Take 10 mg by mouth daily.   Yes Historical Provider, MD  clonazePAM (KLONOPIN) 0.5 MG tablet Take 0.5 mg by mouth 2 (two) times daily as needed for anxiety.   Yes Historical Provider, MD  elvitegravir-cobicistat-emtricitabine-tenofovir (GENVOYA) 150-150-200-10 MG TABS tablet Take 1 tablet by mouth at bedtime.   Yes Historical Provider, MD  EPINEPHrine (EPIPEN 2-PAK) 0.3 mg/0.3 mL IJ SOAJ injection Inject 0.3 mg into the muscle once as needed (for severe allergic reaction).   Yes Historical Provider, MD  escitalopram (LEXAPRO) 20 MG tablet Take 20 mg by mouth daily.   Yes Historical Provider, MD  fluconazole (DIFLUCAN) 200 MG tablet Take 200 mg by mouth daily.   Yes Historical Provider, MD  fluticasone (FLONASE) 50 MCG/ACT nasal spray Place 1 spray into both nostrils daily.   Yes Historical Provider, MD  gemfibrozil (LOPID) 600 MG tablet Take 600 mg by mouth 2 (two) times daily before a meal.   Yes Historical Provider, MD  isosorbide mononitrate (IMDUR) 30 MG 24 hr tablet Take 30 mg by mouth daily.   Yes Historical Provider, MD  linaclotide (LINZESS) 290 MCG CAPS capsule Take 290 mcg by mouth daily before breakfast.   Yes Historical Provider, MD  meloxicam (MOBIC) 7.5 MG tablet Take 7.5 mg by mouth 2 (two) times daily.   Yes Historical Provider, MD  mirtazapine (REMERON) 30 MG tablet Take 30 mg by mouth at bedtime.   Yes Historical Provider, MD  mometasone-formoterol (DULERA) 200-5 MCG/ACT AERO Inhale 2 puffs into the lungs 2 (two) times daily.    Yes Historical Provider, MD  Multiple Vitamin (MULTIVITAMIN WITH MINERALS) TABS tablet Take 1 tablet by mouth daily.   Yes Historical Provider, MD  nitroGLYCERIN (NITROSTAT) 0.4 MG SL tablet Place 0.4 mg under the tongue every 5 (five) minutes as needed for chest pain.   Yes Historical Provider, MD  omeprazole (PRILOSEC) 20 MG capsule Take 20 mg by  mouth 2 (two) times daily before a meal.   Yes Historical Provider, MD  tiotropium (SPIRIVA) 18 MCG inhalation capsule Place 18 mcg into inhaler and inhale daily.   Yes Historical Provider, MD  traMADol (ULTRAM) 50 MG tablet Take 50 mg by mouth 2 (two) times daily.   Yes Historical Provider, MD  zolpidem (AMBIEN CR) 12.5 MG CR tablet Take 12.5 mg by mouth at bedtime as needed for sleep.   Yes Historical Provider, MD      VITAL SIGNS:  Blood pressure 161/94, pulse 82, temperature 97.7 F (36.5 C), temperature source Oral, resp. rate 16, height 5\' 6"  (1.676 m), weight 160 lb (72.576 kg), SpO2 98 %.  PHYSICAL EXAMINATION:  GENERAL:  60 y.o.-year-old patient lying in the bed with no acute distress.  EYES: Pupils equal, round, reactive to light and accommodation. No scleral icterus. Extraocular muscles intact.  HEENT: Head atraumatic, normocephalic. Oropharynx and nasopharynx clear.  NECK:  Supple, no jugular venous distention. No thyroid enlargement, no tenderness.  LUNGS: Normal breath sounds bilaterally, no wheezing,  rales,rhonchi or crepitation. No use of accessory muscles of respiration.  CARDIOVASCULAR: S1, S2 normal. No murmurs, rubs, or gallops.  ABDOMEN: Soft, nontender, nondistended. Bowel sounds present. No organomegaly or mass.  EXTREMITIES: No pedal edema, cyanosis, or clubbing.  NEUROLOGIC: Cranial nerves II through XII are intact. Muscle strength 3-4/5 in bilateral lower extremities. Sensation intact. Gait not checked.  PSYCHIATRIC: The patient is alert and oriented x 3.  SKIN: No obvious rash, lesion, or ulcer.   LABORATORY PANEL:   CBC  Recent Labs Lab 01/13/16 1419  WBC 7.9  7.9  HGB 12.0*  12.1*  HCT 34.8*  34.7*  PLT 191  189   ------------------------------------------------------------------------------------------------------------------  Chemistries   Recent Labs Lab 01/13/16 1419  NA 139  K 4.5  CL 111  CO2 20*  GLUCOSE 177*  BUN 33*   CREATININE 1.17  CALCIUM 8.4*  AST 36  ALT 56  ALKPHOS 75  BILITOT 0.3   ------------------------------------------------------------------------------------------------------------------  Cardiac Enzymes No results for input(s): TROPONINI in the last 168 hours. ------------------------------------------------------------------------------------------------------------------  RADIOLOGY:  Mr Thoracic Spine Wo Contrast  01/13/2016  CLINICAL DATA:  60 year old male with 3 falls in the last 2 days. Bilateral lower extremity weakness. Initial encounter. EXAM: MRI THORACIC SPINE WITHOUT CONTRAST TECHNIQUE: Multiplanar, multisequence MR imaging of the thoracic spine was performed. No intravenous contrast was administered. COMPARISON:  Lumbar MRI from today reported separately. Chest CTA 06/24/2015. Thoracic spine MRI 07/08/2011. FINDINGS: Alignment: Appears stable since 2012 with mild levoconvex upper thoracic scoliosis. Chronic T8 compression fracture is stable. Chronic anterior interbody ankylosis at T4-T5. Vertebrae: Normal bone marrow signal. No marrow edema or evidence of acute osseous abnormality. Cord: Spinal cord signal is within normal limits at all visualized levels. Conus medullaris at T12-L1. Paraspinal and other soft tissues: Negative thoracic paraspinal soft tissues. Negative visualized thoracic and upper abdominal viscera. Disc levels: Widespread chronic thoracic disc desiccation with intermittent mild disc bulging and endplate spurring has not significantly changed since 2012 and results in no thoracic spinal or foraminal stenosis. IMPRESSION: Stable MRI appearance of the thoracic spine since 2012 with no thoracic spinal stenosis or neural impingement. Electronically Signed   By: Odessa Fleming M.D.   On: 01/13/2016 18:21   Mr Lumbar Spine Wo Contrast  01/13/2016  CLINICAL DATA:  60 year old male with 3 falls in 2 days. Bilateral lower extremity weakness. Tremor. Initial encounter. EXAM: MRI  LUMBAR SPINE WITHOUT CONTRAST TECHNIQUE: Multiplanar, multisequence MR imaging of the lumbar spine was performed. No intravenous contrast was administered. COMPARISON:  Thoracic spine MRI from today reported separately. CT Abdomen and Pelvis 10/02/2011. FINDINGS: Segmentation: Normal lumbar segmentation as seen on the 2013 comparison. Alignment: Mild straightening of lower lumbar lordosis, otherwise within normal limits. Vertebrae: Normal bone marrow signal. No marrow edema or evidence of acute osseous abnormality. Visible sacrum intact. Conus medullaris: Extends to the T12-L1 level and appears normal. Paraspinal and other soft tissues: Negative posterior paraspinal soft tissues aside from some paraspinal muscle atrophy. Negative visualized abdominal viscera. Partially visible urinary bladder distension. Disc levels: Chronic disc desiccation in the lower thoracic spine and upper lumbar spine. There is also chronic disc desiccation and disc space loss at L5-S1 with a small central disc protrusion. In the lower lumbar spine there is moderate facet degeneration at L4-L5 greater on the right. There is mild lumbar epidural lipomatosis at most levels, complete epidural lipomatosis at L5-S1 effaces CSF from the thecal sac at that level. There is otherwise no lumbar spinal stenosis. No lumbar lateral recess stenosis.  Isolated mild left L5 lumbar neural foraminal stenosis related to disc and endplate degeneration. IMPRESSION: 1.  No acute osseous abnormality in the lumbar spine. 2. Lumbar spine degeneration but no subsequent spinal or lateral recess stenosis. There is mild left L5 neural foraminal stenosis. Epidural lipomatosis, maximal at L5-S1. 3. Partially visible urinary bladder distension. If the patient is unable to void consider bladder catheterization. Electronically Signed   By: Odessa Fleming M.D.   On: 01/13/2016 18:40    EKG:   Orders placed or performed during the hospital encounter of 01/13/16  . ED EKG  . ED  EKG  . EKG 12-Lead  . EKG 12-Lead    IMPRESSION AND PLAN:   Bilateral lower extremity weakness. The patient will  Be placed for observation. Get an MRI of the brain, follow-up neurologist consult. Get PT and OT evaluation, fall precaution.  Back pain and leg pain. Pain control.  Hypertension. Continue home hypertension medication. Diabetes. Start a sliding scale. COPD. Stable continue home nebulizer treatment.  All the records are reviewed and case discussed with ED provider. Management plans discussed with the patient, family and they are in agreement.  CODE STATUS: full code  TOTAL TIME TAKING CARE OF THIS PATIENT: 48 minutes.    Shaune Pollack M.D on 01/13/2016 at 10:16 PM  Between 7am to 6pm - Pager - [phone]  After 6pm go to [email or web address] - password [email or web address] [email or web address]  [facility]  Office  [phone]  CC: Primary care physician; [facility]

## 2016-01-14 ENCOUNTER — Observation Stay: Payer: Medicare Other

## 2016-01-14 DIAGNOSIS — Z21 Asymptomatic human immunodeficiency virus [HIV] infection status: Secondary | ICD-10-CM | POA: Diagnosis not present

## 2016-01-14 DIAGNOSIS — I10 Essential (primary) hypertension: Secondary | ICD-10-CM | POA: Diagnosis not present

## 2016-01-14 DIAGNOSIS — R29898 Other symptoms and signs involving the musculoskeletal system: Secondary | ICD-10-CM | POA: Diagnosis not present

## 2016-01-14 DIAGNOSIS — E119 Type 2 diabetes mellitus without complications: Secondary | ICD-10-CM | POA: Diagnosis not present

## 2016-01-14 DIAGNOSIS — R531 Weakness: Secondary | ICD-10-CM | POA: Diagnosis not present

## 2016-01-14 DIAGNOSIS — G7289 Other specified myopathies: Secondary | ICD-10-CM | POA: Diagnosis not present

## 2016-01-14 DIAGNOSIS — R2689 Other abnormalities of gait and mobility: Secondary | ICD-10-CM | POA: Diagnosis not present

## 2016-01-14 LAB — RAPID HIV SCREEN (HIV 1/2 AB+AG)
HIV 1/2 ANTIBODIES: REACTIVE — AB
HIV-1 P24 ANTIGEN - HIV24: NONREACTIVE

## 2016-01-14 LAB — BASIC METABOLIC PANEL
ANION GAP: 7 (ref 5–15)
BUN: 30 mg/dL — ABNORMAL HIGH (ref 6–20)
CHLORIDE: 105 mmol/L (ref 101–111)
CO2: 24 mmol/L (ref 22–32)
Calcium: 8.2 mg/dL — ABNORMAL LOW (ref 8.9–10.3)
Creatinine, Ser: 0.97 mg/dL (ref 0.61–1.24)
Glucose, Bld: 206 mg/dL — ABNORMAL HIGH (ref 65–99)
POTASSIUM: 4.4 mmol/L (ref 3.5–5.1)
SODIUM: 136 mmol/L (ref 135–145)

## 2016-01-14 LAB — GLUCOSE, CAPILLARY
GLUCOSE-CAPILLARY: 167 mg/dL — AB (ref 65–99)
GLUCOSE-CAPILLARY: 218 mg/dL — AB (ref 65–99)
GLUCOSE-CAPILLARY: 272 mg/dL — AB (ref 65–99)
Glucose-Capillary: 162 mg/dL — ABNORMAL HIGH (ref 65–99)

## 2016-01-14 LAB — SEDIMENTATION RATE: SED RATE: 56 mm/h — AB (ref 0–20)

## 2016-01-14 LAB — PROTIME-INR
INR: 1.06
Prothrombin Time: 14 seconds (ref 11.4–15.0)

## 2016-01-14 LAB — C-REACTIVE PROTEIN: CRP: 0.5 mg/dL (ref <1.0)

## 2016-01-14 LAB — FOLATE RBC
FOLATE, HEMOLYSATE: 497.8 ng/mL
Folate, RBC: 1451 ng/mL (ref >498)
Hematocrit: 34.3 % — ABNORMAL LOW (ref 37.5–51.0)

## 2016-01-14 LAB — TSH: TSH: 0.092 u[IU]/mL — ABNORMAL LOW (ref 0.350–4.500)

## 2016-01-14 LAB — CK: CK TOTAL: 292 U/L (ref 49–397)

## 2016-01-14 LAB — APTT: aPTT: <24 seconds — ABNORMAL LOW (ref 24–36)

## 2016-01-14 NOTE — Progress Notes (Addendum)
[facility] [place] [facility]   PATIENT NAME: Brian Hampton    MRN#:  [id number]  DATE OF BIRTH:  1955/12/15  SUBJECTIVE:  Hospital Day: none Brian Hampton is a 60 y.o. male presenting with Fall .   Overnight events: No overnight events Interval Events: Still complains of lower extremity weakness unchanged since admission, no issues with urination or bowel movements  REVIEW OF SYSTEMS:  CONSTITUTIONAL: No fever,Positive fatigue or weakness.  EYES: No blurred or double vision.  EARS, NOSE, AND THROAT: No tinnitus or ear pain.  RESPIRATORY: No cough, shortness of breath, wheezing or hemoptysis.  CARDIOVASCULAR: No chest pain, orthopnea, edema.  GASTROINTESTINAL: No nausea, vomiting, diarrhea or abdominal pain.  GENITOURINARY: No dysuria, hematuria.  ENDOCRINE: No polyuria, nocturia,  HEMATOLOGY: No anemia, easy bruising or bleeding SKIN: No rash or lesion. MUSCULOSKELETAL: No joint pain or arthritis.   NEUROLOGIC: No tingling, numbness, positive weakness.  PSYCHIATRY: No anxiety or depression.   DRUG ALLERGIES:   Allergies  Allergen Reactions  . Aspirin Anaphylaxis  . Bee Venom Anaphylaxis  . Penicillins Anaphylaxis and Other (See Comments)    Has patient had a PCN reaction causing immediate rash, facial/tongue/throat swelling, SOB or lightheadedness with hypotension: Yes Has patient had a PCN reaction causing severe rash involving mucus membranes or skin necrosis: No Has patient had a PCN reaction that required hospitalization No Has patient had a PCN reaction occurring within the last 10 years: Yes If all of the above answers are "NO", then may proceed with Cephalosporin use.  . Prednisone Anaphylaxis  . Sulfa Antibiotics Anaphylaxis  . Sulfasalazine Anaphylaxis  . Theophyllines Anaphylaxis    VITALS:  Blood pressure 141/92, pulse 75, temperature 97.9 F (36.6 C), temperature source Oral, resp. rate 16, height 5\' 6"  (1.676 m), weight 160 lb  (72.576 kg), SpO2 99 %.  PHYSICAL EXAMINATION:  VITAL SIGNS: Filed Vitals:   01/14/16 0415 01/14/16 0738  BP: 144/90 141/92  Pulse: 72 75  Temp: 97.9 F (36.6 C)   Resp: 16    GENERAL:59 y.o.male currently in no acute distress.  HEAD: Normocephalic, atraumatic.  EYES: Pupils equal, round, reactive to light. Extraocular muscles intact. No scleral icterus.  MOUTH: Moist mucosal membrane. Dentition intact. No abscess noted.  EAR, NOSE, THROAT: Clear without exudates. No external lesions.  NECK: Supple. No thyromegaly. No nodules. No JVD.  PULMONARY: Clear to ascultation, without wheeze rails or rhonci. No use of accessory muscles, Good respiratory effort. good air entry bilaterally CHEST: Nontender to palpation.  CARDIOVASCULAR: S1 and S2. Regular rate and rhythm. No murmurs, rubs, or gallops. No edema. Pedal pulses 2+ bilaterally.  GASTROINTESTINAL: Soft, nontender, nondistended. No masses. Positive bowel sounds. No hepatosplenomegaly.  MUSCULOSKELETAL: No swelling, clubbing, or edema. Range of motion full in all extremities.  NEUROLOGIC: Cranial nerves II through XII are intact. Strength proximal lower extremity bilateral 2/5 distal strength 4/5 this includes flexion and extension remainder of strength testing within normal limits or negative drift within normal limits patient does appear to have a resting tremor Sensation intact. Reflexes intact.  SKIN: No ulceration, lesions, rashes, or cyanosis. Skin warm and dry. Turgor intact.  PSYCHIATRIC: Mood, affect within normal limits. The patient is awake, alert and oriented x 3. Insight, judgment intact.      LABORATORY PANEL:   CBC  Recent Labs Lab 01/13/16 1419  WBC 7.9  7.9  HGB 12.0*  12.1*  HCT 34.8*  34.7*  PLT 191  189   ------------------------------------------------------------------------------------------------------------------  Chemistries   Recent Labs Lab 01/13/16 1419 01/14/16 0441  NA 139 136  K 4.5  4.4  CL 111 105  CO2 20* 24  GLUCOSE 177* 206*  BUN 33* 30*  CREATININE 1.17 0.97  CALCIUM 8.4* 8.2*  AST 36  --   ALT 56  --   ALKPHOS 75  --   BILITOT 0.3  --    ------------------------------------------------------------------------------------------------------------------  Cardiac Enzymes No results for input(s): TROPONINI in the last 168 hours. ------------------------------------------------------------------------------------------------------------------  RADIOLOGY:  Mr Brain Wo Contrast  01/14/2016  CLINICAL DATA:  Bilateral lower extremity weakness, 2 weeks duration. Gait disturbance with falling. EXAM: MRI HEAD WITHOUT CONTRAST TECHNIQUE: Multiplanar, multiecho pulse sequences of the brain and surrounding structures were obtained without intravenous contrast. COMPARISON:  None. FINDINGS: Diffusion imaging does not show any acute or subacute infarction. There are mild chronic small-vessel ischemic changes of the pons. No focal cerebellar insult. Within the cerebral hemispheres, there are moderate changes of chronic small vessel disease within the deep and subcortical white matter, advanced for age. No cortical or large vessel territory infarction. No mass lesion, hemorrhage, hydrocephalus or extra-axial collection. No pituitary mass. No inflammatory sinus disease. No skull or skullbase lesion. IMPRESSION: Chronic small-vessel ischemic changes, advanced for age. No acute or reversible finding. Electronically Signed   By: Paulina Fusi M.D.   On: 01/14/2016 11:07   Mr Cervical Spine Wo Contrast  01/14/2016  CLINICAL DATA:  Bilateral lower extremity weakness and pain, 2 weeks duration. Gait disturbance with some falling. EXAM: MRI CERVICAL SPINE WITHOUT CONTRAST TECHNIQUE: Multiplanar, multisequence MR imaging of the cervical spine was performed. No intravenous contrast was administered. COMPARISON:  None. FINDINGS: Alignment: Normal Vertebrae: No fracture or focal lesion Cord: No  compression. Normal signal characteristics. No cord lesion. Posterior Fossa, vertebral arteries, paraspinal tissues: Unremarkable Disc levels: Foramen magnum is widely patent. 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 C2-3:  Normal interspace. C3-4: Mild bilateral uncovertebral hypertrophy. No significant canal or foraminal narrowing. C4-5: Endplate osteophytes mild bulging of the disc. No significant canal or foraminal narrowing. C5-6: No disc pathology. Mild facet degeneration and hypertrophy on the left. No stenosis. C6-7: Mild bulging of the disc. Mild facet hypertrophy on the left. No canal or foraminal stenosis. C7-T1:  Normal interspace. Upper thoracic region shows an old minor compression deformity at T3. Mild bulging of the T2-3 disc. No neural compression. IMPRESSION: No likely significant finding in the cervical spine. Minor degenerative changes as outlined above. No cord compression or cord lesion. Electronically Signed   By: Paulina Fusi M.D.   On: 01/14/2016 11:11   Mr Thoracic Spine Wo Contrast  01/13/2016  CLINICAL DATA:  60 year old male with 3 falls in the last 2 days. Bilateral lower extremity weakness. Initial encounter. EXAM: MRI THORACIC SPINE WITHOUT CONTRAST TECHNIQUE: Multiplanar, multisequence MR imaging of the thoracic spine was performed. No intravenous contrast was administered. COMPARISON:  Lumbar MRI from today reported separately. Chest CTA 06/24/2015. Thoracic spine MRI 07/08/2011. FINDINGS: Alignment: Appears stable since 2012 with mild levoconvex upper thoracic scoliosis. Chronic T8 compression fracture is stable. Chronic anterior interbody ankylosis at T4-T5. Vertebrae: Normal bone marrow signal. No marrow edema or evidence of acute osseous abnormality. Cord: Spinal cord signal is within normal limits at all visualized levels. Conus medullaris at T12-L1. Paraspinal and other soft tissues: Negative thoracic paraspinal soft tissues.  Negative visualized thoracic and upper abdominal viscera. Disc levels: Widespread chronic thoracic disc desiccation with intermittent mild disc bulging and  endplate spurring has not significantly changed since 2012 and results in no thoracic spinal or foraminal stenosis. IMPRESSION: Stable MRI appearance of the thoracic spine since 2012 with no thoracic spinal stenosis or neural impingement. Electronically Signed   By: Odessa Fleming M.D.   On: 01/13/2016 18:21   Mr Lumbar Spine Wo Contrast  01/13/2016  CLINICAL DATA:  60 year old male with 3 falls in 2 days. Bilateral lower extremity weakness. Tremor. Initial encounter. EXAM: MRI LUMBAR SPINE WITHOUT CONTRAST TECHNIQUE: Multiplanar, multisequence MR imaging of the lumbar spine was performed. No intravenous contrast was administered. COMPARISON:  Thoracic spine MRI from today reported separately. CT Abdomen and Pelvis 10/02/2011. FINDINGS: Segmentation: Normal lumbar segmentation as seen on the 2013 comparison. Alignment: Mild straightening of lower lumbar lordosis, otherwise within normal limits. Vertebrae: Normal bone marrow signal. No marrow edema or evidence of acute osseous abnormality. Visible sacrum intact. Conus medullaris: Extends to the T12-L1 level and appears normal. Paraspinal and other soft tissues: Negative posterior paraspinal soft tissues aside from some paraspinal muscle atrophy. Negative visualized abdominal viscera. Partially visible urinary bladder distension. Disc levels: Chronic disc desiccation in the lower thoracic spine and upper lumbar spine. There is also chronic disc desiccation and disc space loss at L5-S1 with a small central disc protrusion. In the lower lumbar spine there is moderate facet degeneration at L4-L5 greater on the right. There is mild lumbar epidural lipomatosis at most levels, complete epidural lipomatosis at L5-S1 effaces CSF from the thecal sac at that level. There is otherwise no lumbar spinal stenosis. No lumbar lateral  recess stenosis. Isolated mild left L5 lumbar neural foraminal stenosis related to disc and endplate degeneration. IMPRESSION: 1.  No acute osseous abnormality in the lumbar spine. 2. Lumbar spine degeneration but no subsequent spinal or lateral recess stenosis. There is mild left L5 neural foraminal stenosis. Epidural lipomatosis, maximal at L5-S1. 3. Partially visible urinary bladder distension. If the patient is unable to void consider bladder catheterization. Electronically Signed   By: Odessa Fleming M.D.   On: 01/13/2016 18:40    EKG:   Orders placed or performed during the hospital encounter of 01/13/16  . ED EKG  . ED EKG  . EKG 12-Lead  . EKG 12-Lead    ASSESSMENT AND PLAN:   Brian Hampton is a 60 y.o. male presenting with Fall . Admitted 01/13/2016 : Day #: none   1. Bilateral lower extremity weakness. MRI lumbar spine performed,MRI of the brain/c-spine pending per neurologist consult. PT/OT 2. HIV:  Continue meds, check CD4 - seems good prior compliance so would doubt HIV related neuropathy, maybe medication induced? 2. Essential hypertension, Coreg 3. Type 2 diabetes non-insulin-requiring check hemoglobin A1c. Continue sliding coverage 4. GERD without esophagitis PPI therapy     All the records are reviewed and case discussed with Care Management/Social Workerr. Management plans discussed with the patient, family and they are in agreement.  CODE STATUS: full TOTAL TIME TAKING CARE OF THIS PATIENT: 33 minutes.   POSSIBLE D/C IN 1-2DAYS, DEPENDING ON CLINICAL CONDITION.   Hower,  Mardi Mainland.D on 01/14/2016 at 1:19 PM  Between 7am to 6pm - Pager - [phone]  After 6pm: House Pager: - [phone]  [facility]  Office  [phone]  CC: Primary care physician; [facility]

## 2016-01-14 NOTE — Care Management (Signed)
Per last November visit:  Eber HongNann R Greene, RN Case Manager Signed CASE MANAGEMENT Care Management 06/24/2015 4:21 PM    Expand All Collapse All   Patient admitted under observation for chest pain. Patient resides in a boarding house that has single rooms for nine residents. He is on the lower level, has access to kitchen, bathroom and has a refrigerator in his room. He receives 50 dollars in food stamps. this ws recently decreased from 120 dollars and patient does not know why. Says that [facility] and use [employer] for medicaid transportation. His sister Williemae NatterConnie Gibson provides support. Patient receives around 700 dollars a month disability. His rent costs him 400 dollars. He has looked into the housing authority but "I have to be 60 years old." At present no discharge needs identified

## 2016-01-14 NOTE — Evaluation (Signed)
Physical Therapy Evaluation Patient Details Name: Brian Hampton MRN: [id number] DOB: 02-20-1956 Today's Date: 01/14/2016   History of Present Illness  Pt is a 60 y/o male that presents with progressive onset of LE weakness and increasing number of falls. 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   Clinical Impression  Patient presents for bilateral LE weakness, and reports of numbness/tingling bilaterally. He reports he has not had any falls prior to the past 2-3 weeks, chronic low back pain which is worsened recently. Imaging thus far has been largely negative, findings of mild L5 neural foraminal stenosis. He does present with great toe extensor weakness bilaterally, no sensory changes to gross compression from PT. Able to track finger with eyes, perform reciprocal UE movements (unable to check LEs due to pain). Clonus testing not overtly positive, though tremoring type of response noted. He is able to perform all mobility today with independence aside from use of RW during gait. No buckling noted though patient reports LE pain/weakness during ambulation. Unclear of source of his complaints, it appears L5 compression may be present if great toe extensors are truly weak. He does not display any deficits overtly indicating need for skilled short term rehab currently, though continued monitoring of symptoms and additional testing recommended to elucidate source/nature of symptoms.      Follow Up Recommendations Home health PT    Equipment Recommendations  Rolling walker with 5" wheels    Recommendations for Other Services       Precautions / Restrictions Precautions Precautions: Fall Restrictions Weight Bearing Restrictions: No      Mobility  Bed Mobility Overal bed mobility: Needs Assistance Bed Mobility: Supine to Sit     Supine to sit: Supervision     General bed mobility comments: No deficits identified aside from prolonged time to  complete.   Transfers Overall transfer level: Modified independent Equipment used: Rolling walker (2 wheeled) Transfers: Sit to/from Stand Sit to Stand: Supervision         General transfer comment: Patient steady throughout transfer, no loss of balance noted.   Ambulation/Gait Ambulation/Gait assistance: Supervision Ambulation Distance (Feet): 200 Feet Assistive device: Rolling walker (2 wheeled) Gait Pattern/deviations: WFL(Within Functional Limits)   Gait velocity interpretation: Below normal speed for age/gender General Gait Details: Patient reports feeling weakness in his LEs, numbness in anterior thighs throughout ambulation though reports this has not changed from seated or supine positions. In supine he gestures to mid sternal level and below where he feels numbness/tingling.   Stairs            Wheelchair Mobility    Modified Rankin (Stroke Patients Only)       Balance Overall balance assessment: Needs assistance Sitting-balance support: No upper extremity supported Sitting balance-Leahy Scale: Good     Standing balance support: Bilateral upper extremity supported Standing balance-Leahy Scale: Good                               Pertinent Vitals/Pain Pain Assessment:  (Reports back pain with LE movement, thigh/leg pain with movement though does not appear to change. )    Home Living Family/patient expects to be discharged to:: Group home (Boarding house )                 Additional Comments: 1 step in    Prior Function Level of Independence: Independent  Comments: It sounds like he has been fairly independent up to this point, perhaps occasional use of AD.      Hand Dominance        Extremity/Trunk Assessment   Upper Extremity Assessment: Overall WFL for tasks assessed           Lower Extremity Assessment: RLE deficits/detail;LLE deficits/detail RLE Deficits / Details: Notable great toe extensor weakness,  unable to perform heel slides on contralateral limbs due to pain. Ankle DF/INV/EV/PF WNL.  LLE Deficits / Details: Great toe unable to maintain against therapist, stronger output than R side. Again ankle strength in all directions relatively strong. Able to report sensation grossly throughout LLE/RLE. Clonus test not overtly positive, though appeared to have some kind of tremoring reaction, though not overt clonic response.      Communication   Communication: No difficulties  Cognition Arousal/Alertness: Awake/alert Behavior During Therapy: WFL for tasks assessed/performed Overall Cognitive Status: Within Functional Limits for tasks assessed                      General Comments General comments (skin integrity, edema, etc.): Abrasions to both knees, R hand    Exercises        Assessment/Plan    PT Assessment Patient needs continued PT services  PT Diagnosis Difficulty walking;Generalized weakness   PT Problem List Decreased strength;Decreased balance;Decreased mobility  PT Treatment Interventions DME instruction;Gait training;Stair training;Therapeutic activities;Therapeutic exercise;Balance training   PT Goals (Current goals can be found in the Care Plan section) Acute Rehab PT Goals Patient Stated Goal: To figure out why he's falling.  PT Goal Formulation: With patient Time For Goal Achievement: 01/28/16 Potential to Achieve Goals: Good    Frequency Min 2X/week   Barriers to discharge Decreased caregiver support      Co-evaluation               End of Session Equipment Utilized During Treatment: Gait belt Activity Tolerance: Patient tolerated treatment well Patient left: with call bell/phone within reach;in chair;with chair alarm set Nurse Communication: Mobility status    Functional Assessment Tool Used: Clinical judgement  Functional Limitation: Mobility: Walking and moving around Mobility: Walking and Moving Around Current Status (Z6109): At least  1 percent but less than 20 percent impaired, limited or restricted Mobility: Walking and Moving Around Goal Status [phone]): At least 1 percent but less than 20 percent impaired, limited or restricted    Time: 1319-1339 PT Time Calculation (min) (ACUTE ONLY): 20 min   Charges:   PT Evaluation $PT Eval High Complexity: 1 Procedure     PT G Codes:   PT G-Codes **NOT FOR INPATIENT CLASS** Functional Assessment Tool Used: Clinical judgement  Functional Limitation: Mobility: Walking and moving around Mobility: Walking and Moving Around Current Status (U9811): At least 1 percent but less than 20 percent impaired, limited or restricted Mobility: Walking and Moving Around Goal Status [phone]): At least 1 percent but less than 20 percent impaired, limited or restricted   Kerin Ransom, PT, DPT    01/14/2016, 3:27 PM

## 2016-01-14 NOTE — Care Management (Signed)
Per last November visit:  Nann R Greene, RN Case Manager Signed CASE MANAGEMENT Care Management 06/24/2015 4:21 PM    Expand All Collapse All   Patient admitted under observation for chest pain. Patient resides in a boarding house that has single rooms for nine residents. He is on the lower level, has access to kitchen, bathroom and has a refrigerator in his room. He receives 50 dollars in food stamps. this ws recently decreased from 120 dollars and patient does not know why. Says that he receives assistance from medicaid for his medications and use [employer] for medicaid transportation. His sister Connie Gibson provides support. Patient receives around 700 dollars a month disability. His rent costs him 400 dollars. He has looked into the housing authority but "I have to be 60 years old." At present no discharge needs identified      

## 2016-01-14 NOTE — Care Management Note (Signed)
Case Management Note  Patient Details  Name: Brian Hampton MRN: [id number] Date of Birth: November 01, 1955  Subjective/Objective:                  Met with patient to discuss discharge planning. He is still staying at the boarding house. He states he doesn't have anything to do with his sister as she "was caught stealing and is now in prison". He depends on [employer] for transportation. He believes he will need a walker prior to discharge as he states he is struggling to walk now- which he says is new. Medicare OBServation status explained- no SNF benefit and not routine med coverage. He PCP is Dr. Humphrey Rolls at [facility]. He denies difficulty obtaining his meds. He has a cane that he typically uses to get around. He would like to have home health PT and [facility].  Action/Plan: List of home health agencies provided. Referral to [facility]. PT pending. He may need Rx for walker which will also be provided prior to discharge from [place]. RNCM will continue to follow.   Expected Discharge Date:                  Expected Discharge Plan:     In-House Referral:     Discharge planning Services  CM Consult  Post Acute Care Choice:  Home Health, Durable Medical Equipment Choice offered to:  Patient  DME Arranged:    DME Agency:  [place]:    [facility] Agency:  [place]  Status of Service:  In process, will continue to follow  Medicare Important Message Given:    Date Medicare IM Given:    Medicare IM give by:    Date Additional Medicare IM Given:    Additional Medicare Important Message give by:     If discussed at [place] of Stay Meetings, dates discussed:    Additional Comments:  Marshell Garfinkel, RN 01/14/2016, 11:30 AM

## 2016-01-14 NOTE — Evaluation (Signed)
Occupational Therapy Evaluation Patient Details Name: Brian Hampton MRN: [id number] DOB: 1955/09/01 Today's Date: 01/14/2016    History of Present Illness Pt is a 60 y/o male that presents with progressive onset of LE weakness and increasing number of falls. 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 He is a well controlled longstanding HIV patient admitted 6/6 with falls and bil LE weakness and pain for 2 weeks. On admit was afebrile, wbc nml, CMP nml x low albumin, mild anemia, low B12. CK nml.  Hx of COPD, asthma, DM, renal disorder, lung mass and MI. He lives at a boarding house.     Follow Up Recommendations  Home health OT    Equipment Recommendations  Tub/shower seat (he could benefit from use of shower chair ,reacher and sock aid but not sure he has resources to purchase.)    Recommendations for Other Services       Precautions / Restrictions Precautions Precautions: Fall Restrictions Weight Bearing Restrictions: No      Mobility Bed Mobility Overal bed mobility: Needs Assistance Bed Mobility: Supine to Sit     Supine to sit: Supervision     General bed mobility comments: No deficits identified aside from prolonged time to complete.   Transfers Overall transfer level: Modified independent Equipment used: Rolling walker (2 wheeled) Transfers: Sit to/from Stand Sit to Stand: Supervision         General transfer comment: Patient steady throughout transfer, no loss of balance noted.     Balance Overall balance assessment: Needs assistance Sitting-balance support: No upper extremity supported Sitting balance-Leahy Scale: Good     Standing balance support: Bilateral upper extremity supported Standing balance-Leahy Scale: Good                              ADL Overall ADL's : Needs assistance/impaired Eating/Feeding: Set up;Minimal assistance;Sitting Eating/Feeding Details (indicate cue type and  reason): resting tremors in both hands limit ability to effectively feed self and given foam untensil holder.  Pt could benefit from weighted utensils for tremors but cannot afford to purchase any. Grooming: Wash/dry hands;Wash/dry face;Oral care;Supervision/safety;Standing           Upper Body Dressing : Set up;Min guard   Lower Body Dressing: Maximal assistance;Set up;Sit to/from stand Lower Body Dressing Details (indicate cue type and reason): pt is unable to reach feet for donning or doffing socks and underwear.  Using reacher he was able to complete with min assist and mod cues with delay in response to instructions and poor problem solving.               Functional mobility during ADLs: Supervision/safety;Cueing for safety;Rolling walker General ADL Comments: Pt requires assist for self care skills due to tremors, decreased balance, pain in BUEs and BLEs which is not consistent with performance.  Pt states pain is 9/10 in BUEs and BLEs but wanted to ambulate to toilet vs use urinal which he completed with supervision and cues for safety.     Vision     Perception     Praxis      Pertinent Vitals/Pain Pain Assessment: 0-10 Pain Score: 9  Pain Location: arms and legs Pain Descriptors / Indicators: Constant;Burning Pain Intervention(s): Limited activity within patient's tolerance;Monitored during session;Premedicated before session     Hand Dominance Right   Extremity/Trunk Assessment Upper Extremity Assessment Upper Extremity Assessment: Generalized weakness (AROM was 80  degrees shoulder flexiion but actively able to touch head, weak UEs and hands but full wrist and hand movements; resting tremors in BUEs and body)   Lower Extremity Assessment Lower Extremity Assessment: Defer to PT evaluation RLE Deficits / Details: Notable great toe extensor weakness, unable to perform heel slides on contralateral limbs due to pain. Ankle DF/INV/EV/PF WNL.  LLE Deficits / Details:  Great toe unable to maintain against therapist, stronger output than R side. Again ankle strength in all directions relatively strong. Able to report sensation grossly throughout LLE/RLE. Clonus test not overtly positive, though appeared to have some kind of tremoring reaction, though not overt clonic response.        Communication Communication Communication: No difficulties   Cognition Arousal/Alertness: Awake/alert Behavior During Therapy: WFL for tasks assessed/performed;Flat affect Overall Cognitive Status: Within Functional Limits for tasks assessed (inconsistencies noted with function.  Pt reported pain of 9/10 in LEs but wanted to walk to bathroom to urinate vs urinal)                     General Comments       Exercises       Shoulder Instructions      Home Living Family/patient expects to be discharged to:: Group home (boarding house)                                 Additional Comments: 1 step in      Prior Functioning/Environment Level of Independence: Independent        Comments: Pt reports being independent with ADLs living at boarding house up until 2 weeks ago when he started to fall suddenly and others at boarding house had to help him back up.    OT Diagnosis: Generalized weakness;Acute pain   OT Problem List: Decreased strength;Decreased range of motion;Decreased activity tolerance;Impaired balance (sitting and/or standing);Decreased safety awareness;Decreased coordination;Impaired UE functional use   OT Treatment/Interventions: Self-care/ADL training;DME and/or AE instruction;Therapeutic activities    OT Goals(Current goals can be found in the care plan section) Acute Rehab OT Goals Patient Stated Goal: "to be able to take care of myself again" OT Goal Formulation: With patient Time For Goal Achievement: 01/28/16 Potential to Achieve Goals: Good ADL Goals Pt Will Perform Eating: Independently;with set-up;with adaptive utensils  (using foal utensil holder) Pt Will Perform Lower Body Dressing: with min assist;with adaptive equipment;sit to/from stand (with no LOB when standing) Pt Will Transfer to Toilet: with supervision;regular height toilet;stand pivot transfer (with FWW)  OT Frequency: Min 1X/week   Barriers to D/C:    lives in boarding house       Co-evaluation              End of Session Equipment Utilized During Treatment: Gait belt Nurse Communication: Mobility status (need to unhook IV to put new clean gown on LUE and foam untenisl holders for feeding)  Activity Tolerance: Patient tolerated treatment well Patient left: in bed;with call bell/phone within reach;with bed alarm set   Time: 1500-1600 OT Time Calculation (min): 60 min Charges:  OT General Charges $OT Visit: 1 Procedure OT Evaluation $OT Eval High Complexity: 1 Procedure OT Treatments $Self Care/Home Management : 38-52 mins G-Codes: OT G-codes **NOT FOR INPATIENT CLASS** Functional Limitation: Self care Self Care Current Status (W1191(G8987): At least 40 percent but less than 60 percent impaired, limited or restricted Self Care Goal Status (Y7829(G8988): At least 1 percent but  less than 20 percent impaired, limited or restricted   Susanne Borders, OTR/L ascom [phone] 01/14/2016, 4:22 PM

## 2016-01-14 NOTE — Care Management Obs Status (Signed)
MEDICARE OBSERVATION STATUS NOTIFICATION   Patient Details  Name: Brian Hampton MRN: [id number] Date of Birth: September 25, 1955   Medicare Observation Status Notification Given:  Yes    Collie SiadAngela Dominik Yordy, RN 01/14/2016, 8:21 AM

## 2016-01-14 NOTE — Consult Note (Signed)
[facility] Infectious Disease     Reason for Consult:HIV, LE weakness   Referring Physician: Valentino Nose Date of Admission:  01/13/2016   Principal Problem:   Leg weakness, bilateral   HPI: Brian Hampton is a 60 y.o. male with well controlled longstanding HIV admitted 6/6 with falls and bil LE weakness and pain for 2 weeks.  On admit was afebrile, wbc nml, CMP nml x low albumin, mild anemia, low B12. CK nml.  Last seen 4/13 and viral load Undetectable, cd4 442.  on genvoya- changed in Dec 2016 due to increasign cr on atripla  Past Medical History  Diagnosis Date  . Asthma   . COPD (chronic obstructive pulmonary disease) ([place])   . Coronary artery disease   . Lung mass   . Renal disorder   . Emphysema/COPD ([place])   . Myocardial infarction ([place])     in 2000  . Diabetes mellitus without complication ([place])    History reviewed. No pertinent past surgical history. Social History  Substance Use Topics  . Smoking status: Former Research scientist (life sciences)  . Smokeless tobacco: Never Used  . Alcohol Use: No   Family History  Problem Relation Age of Onset  . CAD      Allergies:  Allergies  Allergen Reactions  . Aspirin Anaphylaxis  . Bee Venom Anaphylaxis  . Penicillins Anaphylaxis and Other (See Comments)    Has patient had a PCN reaction causing immediate rash, facial/tongue/throat swelling, SOB or lightheadedness with hypotension: Yes Has patient had a PCN reaction causing severe rash involving mucus membranes or skin necrosis: No Has patient had a PCN reaction that required hospitalization No Has patient had a PCN reaction occurring within the last 10 years: Yes If all of the above answers are "NO", then may proceed with Cephalosporin use.  . Prednisone Anaphylaxis  . Sulfa Antibiotics Anaphylaxis  . Sulfasalazine Anaphylaxis  . Theophyllines Anaphylaxis    Current antibiotics: Antibiotics Given (last 72 hours)    Date/Time Action Medication Dose   01/13/16 2246 Given    elvitegravir-cobicistat-emtricitabine-tenofovir (GENVOYA) 150-150-200-10 MG tablet 1 tablet 1 tablet      MEDICATIONS: . atorvastatin  40 mg Oral QHS  . carvedilol  12.5 mg Oral BID WC  . elvitegravir-cobicistat-emtricitabine-tenofovir  1 tablet Oral QHS  . enoxaparin (LOVENOX) injection  40 mg Subcutaneous Q24H  . escitalopram  20 mg Oral Daily  . fluconazole  200 mg Oral Daily  . fluticasone  1 spray Each Nare Daily  . gemfibrozil  600 mg Oral BID AC  . insulin aspart  0-5 Units Subcutaneous QHS  . insulin aspart  0-9 Units Subcutaneous TID WC  . isosorbide mononitrate  30 mg Oral Daily  . linaclotide  290 mcg Oral QAC breakfast  . loratadine  10 mg Oral Daily  . mirtazapine  30 mg Oral QHS  . mometasone-formoterol  2 puff Inhalation BID  . pantoprazole  40 mg Oral Daily  . tiotropium  18 mcg Inhalation Daily  . traMADol  50 mg Oral BID    Review of Systems - 11 systems reviewed and negative per HPI   OBJECTIVE: Temp:  [97.7 F (36.5 C)-98.2 F (36.8 C)] 97.9 F (36.6 C) (06/07 0415) Pulse Rate:  [62-92] 75 (06/07 0738) Resp:  [16-20] 16 (06/07 0415) BP: (141-167)/(90-101) 141/92 mmHg (06/07 0738) SpO2:  [97 %-100 %] 99 % (06/07 0738) Weight:  [72.576 kg (160 lb)] 72.576 kg (160 lb) (06/06 1401) Physical Exam  Constitutional: He is oriented to person,  place, and time. He appears well-developed and well-nourished. No distress.  HENT: anicteric Mouth/Throat: Oropharynx is clear and moist. No oropharyngeal exudate. No thrush Cardiovascular: Normal rate, regular rhythm and normal heart sounds. Exam reveals no gallop and no friction rub.  No murmur heard.  Pulmonary/Chest: Effort normal and breath sounds normal. No respiratory distress. He has no wheezes.  Abdominal: Soft. Bowel sounds are normal. He exhibits no distension. There is no tenderness.  Lymphadenopathy:  He has no cervical adenopathy.  Neurological: He is alert and oriented to person, place, and time. Mild le  weakness,  Skin: Skin is warm and dry. No rash noted. No erythema.  Psychiatric: He has a normal mood and affect. His behavior is normal.     LABS: Results for orders placed or performed during the hospital encounter of 01/13/16 (from the past 48 hour(s))  Basic metabolic panel     Status: Abnormal   Collection Time: 01/13/16  2:19 PM  Result Value Ref Range   Sodium 139 135 - 145 mmol/L   Potassium 4.5 3.5 - 5.1 mmol/L   Chloride 111 101 - 111 mmol/L   CO2 20 (L) 22 - 32 mmol/L   Glucose, Bld 177 (H) 65 - 99 mg/dL   BUN 33 (H) 6 - 20 mg/dL   Creatinine, Ser 1.17 0.61 - 1.24 mg/dL   Calcium 8.4 (L) 8.9 - 10.3 mg/dL   GFR calc non Af Amer >60 >60 mL/min   GFR calc Af Amer >60 >60 mL/min    Comment: (NOTE) The eGFR has been calculated using the CKD EPI equation. This calculation has not been validated in all clinical situations. eGFR's persistently <60 mL/min signify possible Chronic Kidney Disease.    Anion gap 8 5 - 15  CBC     Status: Abnormal   Collection Time: 01/13/16  2:19 PM  Result Value Ref Range   WBC 7.9 3.8 - 10.6 K/uL   RBC 3.50 (L) 4.40 - 5.90 MIL/uL   Hemoglobin 12.1 (L) 13.0 - 18.0 g/dL   HCT 34.7 (L) 40.0 - 52.0 %   MCV 99.3 80.0 - 100.0 fL   MCH 34.7 (H) 26.0 - 34.0 pg   MCHC 34.9 32.0 - 36.0 g/dL   RDW 14.1 11.5 - 14.5 %   Platelets 189 150 - 440 K/uL  Vitamin B12     Status: Abnormal   Collection Time: 01/13/16  2:19 PM  Result Value Ref Range   Vitamin B-12 133 (L) 180 - 914 pg/mL    Comment: (NOTE) This assay is not validated for testing neonatal or myeloproliferative syndrome specimens for Vitamin B12 levels. Performed at [facility]   CK     Status: None   Collection Time: 01/13/16  2:19 PM  Result Value Ref Range   Total CK 377 49 - 397 U/L  Hepatic function panel     Status: Abnormal   Collection Time: 01/13/16  2:19 PM  Result Value Ref Range   Total Protein 6.4 (L) 6.5 - 8.1 g/dL   Albumin 2.8 (L) 3.5 - 5.0 g/dL   AST 36 15  - 41 U/L   ALT 56 17 - 63 U/L   Alkaline Phosphatase 75 38 - 126 U/L   Total Bilirubin 0.3 0.3 - 1.2 mg/dL   Bilirubin, Direct <0.1 (L) 0.1 - 0.5 mg/dL   Indirect Bilirubin NOT CALCULATED 0.3 - 0.9 mg/dL  CBC with Differential     Status: Abnormal   Collection Time: 01/13/16  2:19 PM  Result Value Ref Range   WBC 7.9 3.8 - 10.6 K/uL   RBC 3.48 (L) 4.40 - 5.90 MIL/uL   Hemoglobin 12.0 (L) 13.0 - 18.0 g/dL   HCT 69.9 (L) 78.7 - 46.6 %   MCV 100.0 80.0 - 100.0 fL   MCH 34.5 (H) 26.0 - 34.0 pg   MCHC 34.5 32.0 - 36.0 g/dL   RDW 35.5 63.4 - 69.5 %   Platelets 191 150 - 440 K/uL   Neutrophils Relative % 88 %   Lymphocytes Relative 8 %   Monocytes Relative 2 %   Eosinophils Relative 0 %   Basophils Relative 0 %   Band Neutrophils 1 %   Metamyelocytes Relative 1 %   Myelocytes 0 %   Promyelocytes Absolute 0 %   Blasts 0 %   nRBC 0 0 /100 WBC   Other 0 %   Neutro Abs 7.1 (H) 1.4 - 6.5 K/uL   Lymphs Abs 0.6 (L) 1.0 - 3.6 K/uL   Monocytes Absolute 0.2 0.2 - 1.0 K/uL   Eosinophils Absolute 0.0 0 - 0.7 K/uL   Basophils Absolute 0.0 0 - 0.1 K/uL  Urinalysis complete, with microscopic     Status: Abnormal   Collection Time: 01/13/16  3:10 PM  Result Value Ref Range   Color, Urine YELLOW (A) YELLOW   APPearance CLEAR (A) CLEAR   Glucose, UA 150 (A) NEGATIVE mg/dL   Bilirubin Urine NEGATIVE NEGATIVE   Ketones, ur NEGATIVE NEGATIVE mg/dL   Specific Gravity, Urine 1.023 1.005 - 1.030   Hgb urine dipstick 2+ (A) NEGATIVE   pH 6.0 5.0 - 8.0   Protein, ur 100 (A) NEGATIVE mg/dL   Nitrite NEGATIVE NEGATIVE   Leukocytes, UA NEGATIVE NEGATIVE   RBC / HPF 6-30 0 - 5 RBC/hpf   WBC, UA 0-5 0 - 5 WBC/hpf   Bacteria, UA NONE SEEN NONE SEEN   Squamous Epithelial / LPF 0-5 (A) NONE SEEN   Mucous PRESENT    Hyaline Casts, UA PRESENT   Glucose, capillary     Status: Abnormal   Collection Time: 01/13/16 10:29 PM  Result Value Ref Range   Glucose-Capillary 185 (H) 65 - 99 mg/dL  Basic  metabolic panel     Status: Abnormal   Collection Time: 01/14/16  4:41 AM  Result Value Ref Range   Sodium 136 135 - 145 mmol/L   Potassium 4.4 3.5 - 5.1 mmol/L   Chloride 105 101 - 111 mmol/L   CO2 24 22 - 32 mmol/L   Glucose, Bld 206 (H) 65 - 99 mg/dL   BUN 30 (H) 6 - 20 mg/dL   Creatinine, Ser 8.48 0.61 - 1.24 mg/dL   Calcium 8.2 (L) 8.9 - 10.3 mg/dL   GFR calc non Af Amer >60 >60 mL/min   GFR calc Af Amer >60 >60 mL/min    Comment: (NOTE) The eGFR has been calculated using the CKD EPI equation. This calculation has not been validated in all clinical situations. eGFR's persistently <60 mL/min signify possible Chronic Kidney Disease.    Anion gap 7 5 - 15  CK     Status: None   Collection Time: 01/14/16  4:41 AM  Result Value Ref Range   Total CK 292 49 - 397 U/L  Glucose, capillary     Status: Abnormal   Collection Time: 01/14/16  7:38 AM  Result Value Ref Range   Glucose-Capillary 162 (H) 65 - 99 mg/dL  Glucose, capillary  Status: Abnormal   Collection Time: 01/14/16 11:15 AM  Result Value Ref Range   Glucose-Capillary 167 (H) 65 - 99 mg/dL   No components found for: ESR, C REACTIVE PROTEIN MICRO: No results found for this or any previous visit (from the past 720 hour(s)).  IMAGING: Mr Brain Wo Contrast  01/14/2016  CLINICAL DATA:  Bilateral lower extremity weakness, 2 weeks duration. Gait disturbance with falling. EXAM: MRI HEAD WITHOUT CONTRAST TECHNIQUE: Multiplanar, multiecho pulse sequences of the brain and surrounding structures were obtained without intravenous contrast. COMPARISON:  None. FINDINGS: Diffusion imaging does not show any acute or subacute infarction. There are mild chronic small-vessel ischemic changes of the pons. No focal cerebellar insult. Within the cerebral hemispheres, there are moderate changes of chronic small vessel disease within the deep and subcortical white matter, advanced for age. No cortical or large vessel territory infarction. No  mass lesion, hemorrhage, hydrocephalus or extra-axial collection. No pituitary mass. No inflammatory sinus disease. No skull or skullbase lesion. IMPRESSION: Chronic small-vessel ischemic changes, advanced for age. No acute or reversible finding. Electronically Signed   By: Nelson Chimes M.D.   On: 01/14/2016 11:07   Mr Cervical Spine Wo Contrast  01/14/2016  CLINICAL DATA:  Bilateral lower extremity weakness and pain, 2 weeks duration. Gait disturbance with some falling. EXAM: MRI CERVICAL SPINE WITHOUT CONTRAST TECHNIQUE: Multiplanar, multisequence MR imaging of the cervical spine was performed. No intravenous contrast was administered. COMPARISON:  None. FINDINGS: Alignment: Normal Vertebrae: No fracture or focal lesion Cord: No compression. Normal signal characteristics. No cord lesion. Posterior Fossa, vertebral arteries, paraspinal tissues: Unremarkable Disc levels: Foramen magnum is widely patent. 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 C2-3:  Normal interspace. C3-4: Mild bilateral uncovertebral hypertrophy. No significant canal or foraminal narrowing. C4-5: Endplate osteophytes mild bulging of the disc. No significant canal or foraminal narrowing. C5-6: No disc pathology. Mild facet degeneration and hypertrophy on the left. No stenosis. C6-7: Mild bulging of the disc. Mild facet hypertrophy on the left. No canal or foraminal stenosis. C7-T1:  Normal interspace. Upper thoracic region shows an old minor compression deformity at T3. Mild bulging of the T2-3 disc. No neural compression. IMPRESSION: No likely significant finding in the cervical spine. Minor degenerative changes as outlined above. No cord compression or cord lesion. Electronically Signed   By: Nelson Chimes M.D.   On: 01/14/2016 11:11   Mr Thoracic Spine Wo Contrast  01/13/2016  CLINICAL DATA:  60 year old male with 3 falls in the last 2 days. Bilateral lower extremity weakness. Initial encounter.  EXAM: MRI THORACIC SPINE WITHOUT CONTRAST TECHNIQUE: Multiplanar, multisequence MR imaging of the thoracic spine was performed. No intravenous contrast was administered. COMPARISON:  Lumbar MRI from today reported separately. Chest CTA 06/24/2015. Thoracic spine MRI 07/08/2011. FINDINGS: Alignment: Appears stable since 2012 with mild levoconvex upper thoracic scoliosis. Chronic T8 compression fracture is stable. Chronic anterior interbody ankylosis at T4-T5. Vertebrae: Normal bone marrow signal. No marrow edema or evidence of acute osseous abnormality. Cord: Spinal cord signal is within normal limits at all visualized levels. Conus medullaris at T12-L1. Paraspinal and other soft tissues: Negative thoracic paraspinal soft tissues. Negative visualized thoracic and upper abdominal viscera. Disc levels: Widespread chronic thoracic disc desiccation with intermittent mild disc bulging and endplate spurring has not significantly changed since 2012 and results in no thoracic spinal or foraminal stenosis. IMPRESSION: Stable MRI appearance of the thoracic spine since 2012 with no thoracic spinal stenosis or neural impingement. Electronically Signed  By: Genevie Ann M.D.   On: 01/13/2016 18:21   Mr Lumbar Spine Wo Contrast  01/13/2016  CLINICAL DATA:  60 year old male with 3 falls in 2 days. Bilateral lower extremity weakness. Tremor. Initial encounter. EXAM: MRI LUMBAR SPINE WITHOUT CONTRAST TECHNIQUE: Multiplanar, multisequence MR imaging of the lumbar spine was performed. No intravenous contrast was administered. COMPARISON:  Thoracic spine MRI from today reported separately. CT Abdomen and Pelvis 10/02/2011. FINDINGS: Segmentation: Normal lumbar segmentation as seen on the 2013 comparison. Alignment: Mild straightening of lower lumbar lordosis, otherwise within normal limits. Vertebrae: Normal bone marrow signal. No marrow edema or evidence of acute osseous abnormality. Visible sacrum intact. Conus medullaris: Extends to  the T12-L1 level and appears normal. Paraspinal and other soft tissues: Negative posterior paraspinal soft tissues aside from some paraspinal muscle atrophy. Negative visualized abdominal viscera. Partially visible urinary bladder distension. Disc levels: Chronic disc desiccation in the lower thoracic spine and upper lumbar spine. There is also chronic disc desiccation and disc space loss at L5-S1 with a small central disc protrusion. In the lower lumbar spine there is moderate facet degeneration at L4-L5 greater on the right. There is mild lumbar epidural lipomatosis at most levels, complete epidural lipomatosis at L5-S1 effaces CSF from the thecal sac at that level. There is otherwise no lumbar spinal stenosis. No lumbar lateral recess stenosis. Isolated mild left L5 lumbar neural foraminal stenosis related to disc and endplate degeneration. IMPRESSION: 1.  No acute osseous abnormality in the lumbar spine. 2. Lumbar spine degeneration but no subsequent spinal or lateral recess stenosis. There is mild left L5 neural foraminal stenosis. Epidural lipomatosis, maximal at L5-S1. 3. Partially visible urinary bladder distension. If the patient is unable to void consider bladder catheterization. Electronically Signed   By: Genevie Ann M.D.   On: 01/13/2016 18:40    Assessment:   Brian Hampton is a 60 y.o. male with well controlled HIV admitted with several weeks of LE weakness and increasing falls.  Labs nml except low B12, mri brain spine nml.  Unclear etiology however he is on a statin and also tells me he is on an inhalled nebulized steroid at home.  He was switched to genvoya in Dec 2016 and this contains cobicistat which is a potent inhibitor of cytochrome P 450 and can lead to systemic steroid levels from inhaled steroids.  He cannot tell me the name of the steroid however.  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  However would be a dx of exclusion.   Recommendations LP - check rpr, VDRL I will check  HLA [id number] and if negative can change his HIV med to Triumeq in place of the [place].  Hold inhaled steroids if possible. Monitor for steroid withdrawal.  Thank you very much for allowing me to participate in the care of this patient. Please call with questions.   Cheral Marker. Ola Spurr, MD

## 2016-01-14 NOTE — Consult Note (Addendum)
Reason for Consult:Lower extremity weakness Referring Physician: Hower   CC: Lower extremity weakness  HPI: Brian Hampton is an 60 y.o. HIV positive male who is a very poor historian.  It appears that for the past week he has been falling more frequently.  It appears that he has had a gradual decline.  Prior to a month ago he was falling about twice a month.  Was using a cane for gait.  Then over the pat week noted that he would stand at times and after a step or two his legs would give out.  Prior to that he could walk a few hundred feet before his legs would start to get weak.  He reports pain all over but particularly in his legs and he is unclear if he is weak because the pain is overwhelming or if he is actually weak.  He has been urinating and having bowel movements with no incontinence but feels he is urinating less.  Does not report any numbness or tingling.    Past Medical History  Diagnosis Date  . Asthma   . COPD (chronic obstructive pulmonary disease) ([place])   . Coronary artery disease   . Lung mass   . Renal disorder   . Emphysema/COPD ([place])   . Myocardial infarction ([place])     in 2000  . Diabetes mellitus without complication ([place])     History reviewed. No pertinent past surgical history.  Family History  Problem Relation Age of Onset  . CAD      Social History:  reports that he has quit smoking. He has never used smokeless tobacco. He reports that he does not drink alcohol or use illicit drugs.  Allergies  Allergen Reactions  . Aspirin Anaphylaxis  . Bee Venom Anaphylaxis  . Penicillins Anaphylaxis and Other (See Comments)    Has patient had a PCN reaction causing immediate rash, facial/tongue/throat swelling, SOB or lightheadedness with hypotension: Yes Has patient had a PCN reaction causing severe rash involving mucus membranes or skin necrosis: No Has patient had a PCN reaction that required hospitalization No Has patient had a PCN reaction occurring within the  last 10 years: Yes If all of the above answers are "NO", then may proceed with Cephalosporin use.  . Prednisone Anaphylaxis  . Sulfa Antibiotics Anaphylaxis  . Sulfasalazine Anaphylaxis  . Theophyllines Anaphylaxis    Medications:  I have reviewed the patient's current medications. Prior to Admission:  Prescriptions prior to admission  Medication Sig Dispense Refill Last Dose  . albuterol (PROVENTIL HFA;VENTOLIN HFA) 108 (90 BASE) MCG/ACT inhaler Inhale 2 puffs into the lungs every 6 (six) hours as needed for wheezing or shortness of breath.   01/13/2016 at Unknown time  . albuterol (PROVENTIL) (2.5 MG/3ML) 0.083% nebulizer solution Take 2.5 mg by nebulization every 6 (six) hours as needed for wheezing or shortness of breath.   01/13/2016 at Unknown time  . atorvastatin (LIPITOR) 40 MG tablet Take 40 mg by mouth at bedtime.   01/12/2016 at Unknown time  . Calcium Carbonate-Vitamin D (CALCIUM 600+D) 600-400 MG-UNIT tablet Take 1 tablet by mouth 2 (two) times daily.   01/13/2016 at Unknown time  . carvedilol (COREG) 12.5 MG tablet Take 12.5 mg by mouth 2 (two) times daily with a meal.   01/13/2016 at 0900  . cetirizine (ZYRTEC) 10 MG tablet Take 10 mg by mouth daily.   01/13/2016 at Unknown time  . clonazePAM (KLONOPIN) 0.5 MG tablet Take 0.5 mg by mouth 2 (two)  times daily as needed for anxiety.   01/13/2016 at Unknown time  . elvitegravir-cobicistat-emtricitabine-tenofovir (GENVOYA) 150-150-200-10 MG TABS tablet Take 1 tablet by mouth at bedtime.   01/12/2016 at Unknown time  . EPINEPHrine (EPIPEN 2-PAK) 0.3 mg/0.3 mL IJ SOAJ injection Inject 0.3 mg into the muscle once as needed (for severe allergic reaction).   Past Month at Unknown time  . escitalopram (LEXAPRO) 20 MG tablet Take 20 mg by mouth daily.   01/13/2016 at Unknown time  . fluconazole (DIFLUCAN) 200 MG tablet Take 200 mg by mouth daily.   01/13/2016 at Unknown time  . fluticasone (FLONASE) 50 MCG/ACT nasal spray Place 1 spray into both nostrils  daily.   01/13/2016 at Unknown time  . gemfibrozil (LOPID) 600 MG tablet Take 600 mg by mouth 2 (two) times daily before a meal.   01/13/2016 at Unknown time  . isosorbide mononitrate (IMDUR) 30 MG 24 hr tablet Take 30 mg by mouth daily.   01/13/2016 at Unknown time  . linaclotide (LINZESS) 290 MCG CAPS capsule Take 290 mcg by mouth daily before breakfast.   01/13/2016 at Unknown time  . meloxicam (MOBIC) 7.5 MG tablet Take 7.5 mg by mouth 2 (two) times daily.   01/13/2016 at 0900  . mirtazapine (REMERON) 30 MG tablet Take 30 mg by mouth at bedtime.   01/12/2016 at Unknown time  . mometasone-formoterol (DULERA) 200-5 MCG/ACT AERO Inhale 2 puffs into the lungs 2 (two) times daily.    01/13/2016 at Unknown time  . Multiple Vitamin (MULTIVITAMIN WITH MINERALS) TABS tablet Take 1 tablet by mouth daily.   01/13/2016 at Unknown time  . nitroGLYCERIN (NITROSTAT) 0.4 MG SL tablet Place 0.4 mg under the tongue every 5 (five) minutes as needed for chest pain.   Past Month at Unknown time  . omeprazole (PRILOSEC) 20 MG capsule Take 20 mg by mouth 2 (two) times daily before a meal.   01/13/2016 at Unknown time  . tiotropium (SPIRIVA) 18 MCG inhalation capsule Place 18 mcg into inhaler and inhale daily.   01/13/2016 at Unknown time  . traMADol (ULTRAM) 50 MG tablet Take 50 mg by mouth 2 (two) times daily.   01/13/2016 at 0900  . zolpidem (AMBIEN CR) 12.5 MG CR tablet Take 12.5 mg by mouth at bedtime as needed for sleep.   01/12/2016 at Unknown time   Scheduled: . atorvastatin  40 mg Oral QHS  . carvedilol  12.5 mg Oral BID WC  . elvitegravir-cobicistat-emtricitabine-tenofovir  1 tablet Oral QHS  . enoxaparin (LOVENOX) injection  40 mg Subcutaneous Q24H  . escitalopram  20 mg Oral Daily  . fluconazole  200 mg Oral Daily  . fluticasone  1 spray Each Nare Daily  . gemfibrozil  600 mg Oral BID AC  . insulin aspart  0-5 Units Subcutaneous QHS  . insulin aspart  0-9 Units Subcutaneous TID WC  . isosorbide mononitrate  30 mg Oral  Daily  . linaclotide  290 mcg Oral QAC breakfast  . loratadine  10 mg Oral Daily  . mirtazapine  30 mg Oral QHS  . mometasone-formoterol  2 puff Inhalation BID  . pantoprazole  40 mg Oral Daily  . tiotropium  18 mcg Inhalation Daily  . traMADol  50 mg Oral BID    ROS: History obtained from the patient  General ROS: negative for - chills, fatigue, fever, night sweats, weight gain or weight loss Psychological ROS: negative for - behavioral disorder, hallucinations, memory difficulties, mood swings or suicidal ideation  Ophthalmic ROS: negative for - blurry vision, double vision, eye pain or loss of vision ENT ROS: negative for - epistaxis, nasal discharge, oral lesions, sore throat, tinnitus or vertigo Allergy and Immunology ROS: negative for - hives or itchy/watery eyes Hematological and Lymphatic ROS: negative for - bleeding problems, bruising or swollen lymph nodes Endocrine ROS: negative for - galactorrhea, hair pattern changes, polydipsia/polyuria or temperature intolerance Respiratory ROS: negative for - cough, hemoptysis, shortness of breath or wheezing Cardiovascular ROS: negative for - chest pain, dyspnea on exertion, edema or irregular heartbeat Gastrointestinal ROS: negative for - abdominal pain, diarrhea, hematemesis, nausea/vomiting or stool incontinence Genito-Urinary ROS: negative for - dysuria, hematuria, incontinence or urinary frequency/urgency Musculoskeletal ROS: as noted in HPI Neurological ROS: as noted in HPI Dermatological ROS: negative for rash and skin lesion changes  Physical Examination: Blood pressure 141/92, pulse 75, temperature 97.9 F (36.6 C), temperature source Oral, resp. rate 16, height '5\' 6"'$  (1.676 m), weight 72.576 kg (160 lb), SpO2 99 %.  HEENT-  Normocephalic, no lesions, without obvious abnormality.  Normal external eye and conjunctiva.  Normal TM's bilaterally.  Normal auditory canals and external ears. Normal external nose, mucus membranes and  septum.  Normal pharynx. Cardiovascular- S1, S2 normal, pulses palpable throughout   Lungs- chest clear, no wheezing, rales, normal symmetric air entry Abdomen- soft, non-tender; bowel sounds normal; no masses,  no organomegaly Extremities- no edema Lymph-no adenopathy palpable Musculoskeletal-no joint tenderness, deformity or swelling Skin-bruising in lower extremities bilaterally  Neurological Examination Mental Status: Alert, oriented, thought content appropriate.  Speech fluent without evidence of aphasia.  Able to follow 3 step commands without difficulty. Cranial Nerves: II: Discs flat bilaterally; Visual fields grossly normal, pupils equal, round, reactive to light and accommodation III,IV, VI: ptosis not present, extra-ocular motions intact bilaterally V,VII: smile symmetric, facial light touch sensation normal bilaterally, left facial tremor VIII: hearing normal bilaterally IX,X: gag reflex present XI: bilateral shoulder shrug XII: midline tongue extension Motor: Able to lift all extremities against gravity but has significant give-way due to pain in both the upper and lower extremities.   Sensory: Pinprick and light touch intact throughout, bilaterally Deep Tendon Reflexes: 2+ and symmetric throughout Plantars: Right: downgoing   Left: downgoing Cerebellar: Normal finger-to-nose testing bilaterally with tremor in both upper extremities Gait: able to walk with a walker as being assisted by PT.  Gait narrow based.      Laboratory Studies:   Basic Metabolic Panel:  Recent Labs Lab 01/13/16 1419 01/14/16 0441  NA 139 136  K 4.5 4.4  CL 111 105  CO2 20* 24  GLUCOSE 177* 206*  BUN 33* 30*  CREATININE 1.17 0.97  CALCIUM 8.4* 8.2*    Liver Function Tests:  Recent Labs Lab 01/13/16 1419  AST 36  ALT 56  ALKPHOS 75  BILITOT 0.3  PROT 6.4*  ALBUMIN 2.8*   No results for input(s): LIPASE, AMYLASE in the last 168 hours. No results for input(s): AMMONIA in the  last 168 hours.  CBC:  Recent Labs Lab 01/13/16 1419  WBC 7.9  7.9  NEUTROABS 7.1*  HGB 12.0*  12.1*  HCT 34.8*  34.7*  MCV 100.0  99.3  PLT 191  189    Cardiac Enzymes:  Recent Labs Lab 01/13/16 1419 01/14/16 0441  CKTOTAL 377 292    BNP: Invalid input(s): POCBNP  CBG:  Recent Labs Lab 01/13/16 2229 01/14/16 0738 01/14/16 1115  GLUCAP 185* 162* 167*    Microbiology: Results for orders placed  or performed during the hospital encounter of 06/24/15  MRSA PCR Screening     Status: None   Collection Time: 06/24/15  2:05 PM  Result Value Ref Range Status   MRSA by PCR NEGATIVE NEGATIVE Final    Comment:        The GeneXpert MRSA Assay (FDA approved for NASAL specimens only), is one component of a comprehensive MRSA colonization surveillance program. It is not intended to diagnose MRSA infection nor to guide or monitor treatment for MRSA infections.     Coagulation Studies: No results for input(s): LABPROT, INR in the last 72 hours.  Urinalysis:  Recent Labs Lab 01/13/16 1510  COLORURINE YELLOW*  LABSPEC 1.023  PHURINE 6.0  GLUCOSEU 150*  HGBUR 2+*  BILIRUBINUR NEGATIVE  KETONESUR NEGATIVE  PROTEINUR 100*  NITRITE NEGATIVE  LEUKOCYTESUR NEGATIVE    Lipid Panel:     Component Value Date/Time   CHOL 161 06/25/2015 0426   TRIG 96 06/25/2015 0426   HDL 42 06/25/2015 0426   CHOLHDL 3.8 06/25/2015 0426   VLDL 19 06/25/2015 0426   LDLCALC 100* 06/25/2015 0426    HgbA1C: No results found for: HGBA1C  Urine Drug Screen:  No results found for: LABOPIA, COCAINSCRNUR, LABBENZ, AMPHETMU, THCU, LABBARB  Alcohol Level: No results for input(s): ETH in the last 168 hours.  Other results: EKG: sinus rhythm at 87 bpm.  Imaging: Mr Brain Wo Contrast  01/14/2016  CLINICAL DATA:  Bilateral lower extremity weakness, 2 weeks duration. Gait disturbance with falling. EXAM: MRI HEAD WITHOUT CONTRAST TECHNIQUE: Multiplanar, multiecho pulse sequences  of the brain and surrounding structures were obtained without intravenous contrast. COMPARISON:  None. FINDINGS: Diffusion imaging does not show any acute or subacute infarction. There are mild chronic small-vessel ischemic changes of the pons. No focal cerebellar insult. Within the cerebral hemispheres, there are moderate changes of chronic small vessel disease within the deep and subcortical white matter, advanced for age. No cortical or large vessel territory infarction. No mass lesion, hemorrhage, hydrocephalus or extra-axial collection. No pituitary mass. No inflammatory sinus disease. No skull or skullbase lesion. IMPRESSION: Chronic small-vessel ischemic changes, advanced for age. No acute or reversible finding. Electronically Signed   By: Paulina Fusi M.D.   On: 01/14/2016 11:07   Mr Cervical Spine Wo Contrast  01/14/2016  CLINICAL DATA:  Bilateral lower extremity weakness and pain, 2 weeks duration. Gait disturbance with some falling. EXAM: MRI CERVICAL SPINE WITHOUT CONTRAST TECHNIQUE: Multiplanar, multisequence MR imaging of the cervical spine was performed. No intravenous contrast was administered. COMPARISON:  None. FINDINGS: Alignment: Normal Vertebrae: No fracture or focal lesion Cord: No compression. Normal signal characteristics. No cord lesion. Posterior Fossa, vertebral arteries, paraspinal tissues: Unremarkable Disc levels: Foramen magnum is widely patent. 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 C2-3:  Normal interspace. C3-4: Mild bilateral uncovertebral hypertrophy. No significant canal or foraminal narrowing. C4-5: Endplate osteophytes mild bulging of the disc. No significant canal or foraminal narrowing. C5-6: No disc pathology. Mild facet degeneration and hypertrophy on the left. No stenosis. C6-7: Mild bulging of the disc. Mild facet hypertrophy on the left. No canal or foraminal stenosis. C7-T1:  Normal interspace. Upper thoracic region shows  an old minor compression deformity at T3. Mild bulging of the T2-3 disc. No neural compression. IMPRESSION: No likely significant finding in the cervical spine. Minor degenerative changes as outlined above. No cord compression or cord lesion. Electronically Signed   By: Paulina Fusi M.D.   On:  01/14/2016 11:11   Mr Thoracic Spine Wo Contrast  01/13/2016  CLINICAL DATA:  60 year old male with 3 falls in the last 2 days. Bilateral lower extremity weakness. Initial encounter. EXAM: MRI THORACIC SPINE WITHOUT CONTRAST TECHNIQUE: Multiplanar, multisequence MR imaging of the thoracic spine was performed. No intravenous contrast was administered. COMPARISON:  Lumbar MRI from today reported separately. Chest CTA 06/24/2015. Thoracic spine MRI 07/08/2011. FINDINGS: Alignment: Appears stable since 2012 with mild levoconvex upper thoracic scoliosis. Chronic T8 compression fracture is stable. Chronic anterior interbody ankylosis at T4-T5. Vertebrae: Normal bone marrow signal. No marrow edema or evidence of acute osseous abnormality. Cord: Spinal cord signal is within normal limits at all visualized levels. Conus medullaris at T12-L1. Paraspinal and other soft tissues: Negative thoracic paraspinal soft tissues. Negative visualized thoracic and upper abdominal viscera. Disc levels: Widespread chronic thoracic disc desiccation with intermittent mild disc bulging and endplate spurring has not significantly changed since 2012 and results in no thoracic spinal or foraminal stenosis. IMPRESSION: Stable MRI appearance of the thoracic spine since 2012 with no thoracic spinal stenosis or neural impingement. Electronically Signed   By: Genevie Ann M.D.   On: 01/13/2016 18:21   Mr Lumbar Spine Wo Contrast  01/13/2016  CLINICAL DATA:  60 year old male with 3 falls in 2 days. Bilateral lower extremity weakness. Tremor. Initial encounter. EXAM: MRI LUMBAR SPINE WITHOUT CONTRAST TECHNIQUE: Multiplanar, multisequence MR imaging of the lumbar  spine was performed. No intravenous contrast was administered. COMPARISON:  Thoracic spine MRI from today reported separately. CT Abdomen and Pelvis 10/02/2011. FINDINGS: Segmentation: Normal lumbar segmentation as seen on the 2013 comparison. Alignment: Mild straightening of lower lumbar lordosis, otherwise within normal limits. Vertebrae: Normal bone marrow signal. No marrow edema or evidence of acute osseous abnormality. Visible sacrum intact. Conus medullaris: Extends to the T12-L1 level and appears normal. Paraspinal and other soft tissues: Negative posterior paraspinal soft tissues aside from some paraspinal muscle atrophy. Negative visualized abdominal viscera. Partially visible urinary bladder distension. Disc levels: Chronic disc desiccation in the lower thoracic spine and upper lumbar spine. There is also chronic disc desiccation and disc space loss at L5-S1 with a small central disc protrusion. In the lower lumbar spine there is moderate facet degeneration at L4-L5 greater on the right. There is mild lumbar epidural lipomatosis at most levels, complete epidural lipomatosis at L5-S1 effaces CSF from the thecal sac at that level. There is otherwise no lumbar spinal stenosis. No lumbar lateral recess stenosis. Isolated mild left L5 lumbar neural foraminal stenosis related to disc and endplate degeneration. IMPRESSION: 1.  No acute osseous abnormality in the lumbar spine. 2. Lumbar spine degeneration but no subsequent spinal or lateral recess stenosis. There is mild left L5 neural foraminal stenosis. Epidural lipomatosis, maximal at L5-S1. 3. Partially visible urinary bladder distension. If the patient is unable to void consider bladder catheterization. Electronically Signed   By: Genevie Ann M.D.   On: 01/13/2016 18:40     Assessment/Plan: 60 year old HIV positive male presenting with progressive lower extremity weakness and pain.  MRI performed of the brain, cervical, thoracic and lumbar spines and  personally reviewed.  No etiology for weakness and pain identified.  Unclear if this may be related to his HIV status but last testing showed normal cell counts making HIV myelopathy, etc less likely  CK normal.  B12 low and could be contributing to symptoms. Patient on statins as well, LFT's normal.    Recommendations: 1.  CRP, ESR, RPR, ANA, ceruloplasmin, heavy  metal screen 2.  LP. PT/INR, PTT to be checked 3.  B12 supplementation 4.  If above work up unremarkable patient to have an NCV/EMG as an outpatient.   5.  D/C statins  Alexis Goodell, MD Neurology [phone] 01/14/2016, 1:02 PM

## 2016-01-15 DIAGNOSIS — I10 Essential (primary) hypertension: Secondary | ICD-10-CM | POA: Diagnosis not present

## 2016-01-15 DIAGNOSIS — Z21 Asymptomatic human immunodeficiency virus [HIV] infection status: Secondary | ICD-10-CM | POA: Diagnosis not present

## 2016-01-15 DIAGNOSIS — E119 Type 2 diabetes mellitus without complications: Secondary | ICD-10-CM | POA: Diagnosis not present

## 2016-01-15 DIAGNOSIS — G7289 Other specified myopathies: Secondary | ICD-10-CM | POA: Diagnosis not present

## 2016-01-15 DIAGNOSIS — R29898 Other symptoms and signs involving the musculoskeletal system: Secondary | ICD-10-CM | POA: Diagnosis not present

## 2016-01-15 LAB — HELPER T-LYMPH-CD4 (ARMC ONLY)
% CD 4 Pos. Lymph.: 14.2 % — ABNORMAL LOW (ref 30.8–58.5)
Absolute CD 4 Helper: 71 /uL — ABNORMAL LOW (ref 359–1519)
BASOS ABS: 0 10*3/uL (ref 0.0–0.2)
Basos: 0 %
EOS (ABSOLUTE): 0 10*3/uL (ref 0.0–0.4)
EOS: 0 %
Hematocrit: 35.3 % — ABNORMAL LOW (ref 37.5–51.0)
Hemoglobin: 12.5 g/dL — ABNORMAL LOW (ref 12.6–17.7)
IMMATURE GRANULOCYTES: 1 %
Immature Grans (Abs): 0.1 10*3/uL (ref 0.0–0.1)
Lymphocytes Absolute: 0.5 10*3/uL — ABNORMAL LOW (ref 0.7–3.1)
Lymphs: 7 %
MCH: 34 pg — AB (ref 26.6–33.0)
MCHC: 35.4 g/dL (ref 31.5–35.7)
MCV: 96 fL (ref 79–97)
MONOS ABS: 0.3 10*3/uL (ref 0.1–0.9)
Monocytes: 4 %
NEUTROS PCT: 88 %
Neutrophils Absolute: 6.6 10*3/uL (ref 1.4–7.0)
PLATELETS: 215 10*3/uL (ref 150–379)
RBC: 3.68 x10E6/uL — ABNORMAL LOW (ref 4.14–5.80)
RDW: 14.1 % (ref 12.3–15.4)
WBC: 7.5 10*3/uL (ref 3.4–10.8)

## 2016-01-15 LAB — PROTEIN AND GLUCOSE, CSF
Glucose, CSF: 104 mg/dL — ABNORMAL HIGH (ref 40–70)
TOTAL PROTEIN, CSF: 33 mg/dL (ref 15–45)

## 2016-01-15 LAB — CSF CELL COUNT WITH DIFFERENTIAL
RBC Count, CSF: 0 /mm3 (ref 0–3)
TUBE #: 3
WBC, CSF: 0 /mm3

## 2016-01-15 LAB — RPR: RPR Ser Ql: NONREACTIVE

## 2016-01-15 LAB — ANA W/REFLEX IF POSITIVE: Anti Nuclear Antibody(ANA): NEGATIVE

## 2016-01-15 LAB — CERULOPLASMIN: CERULOPLASMIN: 31.4 mg/dL — AB (ref 16.0–31.0)

## 2016-01-15 LAB — GLUCOSE, CAPILLARY
Glucose-Capillary: 180 mg/dL — ABNORMAL HIGH (ref 65–99)
Glucose-Capillary: 207 mg/dL — ABNORMAL HIGH (ref 65–99)
Glucose-Capillary: 219 mg/dL — ABNORMAL HIGH (ref 65–99)
Glucose-Capillary: 221 mg/dL — ABNORMAL HIGH (ref 65–99)
Glucose-Capillary: 213 mg/dL — ABNORMAL HIGH (ref 65–99)

## 2016-01-15 LAB — HEAVY METALS, BLOOD
ARSENIC: 9 ug/L (ref 2–23)
Lead: NOT DETECTED ug/dL (ref 0–19)
MERCURY: NOT DETECTED ug/L (ref 0.0–14.9)

## 2016-01-15 LAB — HIV-1/2 AB - DIFFERENTIATION
HIV 1 Ab: POSITIVE — AB
HIV 2 Ab: NEGATIVE

## 2016-01-15 LAB — T4, FREE: FREE T4: 1.25 ng/dL — AB (ref 0.61–1.12)

## 2016-01-15 LAB — HEMOGLOBIN A1C: HEMOGLOBIN A1C: 6.2 % — AB (ref 4.0–6.0)

## 2016-01-15 MED ORDER — TRAMADOL HCL 50 MG PO TABS
50.0000 mg | ORAL_TABLET | Freq: Two times a day (BID) | ORAL | Status: DC | PRN
  Administered 2016-01-16: 50 mg via ORAL
  Filled 2016-01-15: qty 1

## 2016-01-15 NOTE — Progress Notes (Signed)
OT Cancellation Note  Patient Details Name: Brian Hampton MRN: [id number] DOB: 08-May-1956   Cancelled Treatment:    Reason Eval/Treat Not Completed: Medical issues which prohibited therapy Pt underwent lumbar puncture and should remain flat for an extended period of time after procedure as well as avoid strenuous activity. Not appropriate for OT at this time. Will attempt OT treatment again tomorow.  Susanne BordersSusan Wofford, OTR/L ascom [phone] 01/15/2016, 4:50 PM

## 2016-01-15 NOTE — Progress Notes (Signed)
PT Cancellation Note  Patient Details Name: Brian Hampton MRN: [id number] DOB: 04-Jul-1956   Cancelled Treatment:    Reason Eval/Treat Not Completed: Medical issues which prohibited therapy. Pt underwent lumbar puncture and should remain flat for an extended period of time after procedure as well as avoid strenuous activity. Not appropriate for PT at this time. Will attempt PT treatment on later time/date as appropriate.  Sharalyn InkJason D Huprich PT, DPT   Huprich,Jason 01/15/2016, 1:55 PM

## 2016-01-15 NOTE — Care Management (Signed)
Rolling walker requested from [facility] along with order for HHPT and HHOT also provided through [facility]. RNCM will continue to follow.

## 2016-01-15 NOTE — Procedures (Signed)
LP Procedure Note:  Patient has been seen and examined.  Reports no change in complaints although does appear to be in less pain.  LP is being performed to rule out infection.  Procedure has been explained to patient including risks and benefits.  Consent has been signed by patient and witnessed.   Blood pressure 155/92, pulse 77, temperature 98.3 F (36.8 C), temperature source Oral, resp. rate 22, height  (1.676 m), weight 72.576 kg (160 lb), SpO2 98 %.   Current facility-administered medications:  .  acetaminophen (TYLENOL) tablet 650 mg, 650 mg, Oral, Q6H PRN **OR** acetaminophen (TYLENOL) suppository 650 mg, 650 mg, Rectal, Q6H PRN, Shaune Pollack, MD .  albuterol (PROVENTIL) (2.5 MG/3ML) 0.083% nebulizer solution 2.5 mg, 2.5 mg, Nebulization, Q2H PRN, Shaune Pollack, MD .  carvedilol (COREG) tablet 12.5 mg, 12.5 mg, Oral, BID WC, Shaune Pollack, MD, 12.5 mg at 01/15/16 0809 .  clonazePAM (KLONOPIN) tablet 0.5 mg, 0.5 mg, Oral, BID PRN, Shaune Pollack, MD .  elvitegravir-cobicistat-emtricitabine-tenofovir (GENVOYA) 150-150-200-10 MG tablet 1 tablet, 1 tablet, Oral, QHS, Shaune Pollack, MD, 1 tablet at 01/14/16 2219 .  enoxaparin (LOVENOX) injection 40 mg, 40 mg, Subcutaneous, Q24H, Shaune Pollack, MD, 40 mg at 01/14/16 2213 .  EPINEPHrine (EPI-PEN) injection 0.3 mg, 0.3 mg, Intramuscular, Once PRN, Shaune Pollack, MD .  escitalopram (LEXAPRO) tablet 20 mg, 20 mg, Oral, Daily, Shaune Pollack, MD, 20 mg at 01/15/16 0809 .  fluconazole (DIFLUCAN) tablet 200 mg, 200 mg, Oral, Daily, Shaune Pollack, MD, 200 mg at 01/15/16 0809 .  fluticasone (FLONASE) 50 MCG/ACT nasal spray 1 spray, 1 spray, Each Nare, Daily, Shaune Pollack, MD, 1 spray at 01/14/16 1132 .  HYDROcodone-acetaminophen (NORCO/VICODIN) 5-325 MG per tablet 1-2 tablet, 1-2 tablet, Oral, Q4H PRN, Shaune Pollack, MD, 1 tablet at 01/14/16 0855 .  insulin aspart (novoLOG) injection 0-5 Units, 0-5 Units, Subcutaneous, QHS, Shaune Pollack, MD, 3 Units at 01/14/16 2219 .  insulin aspart (novoLOG)  injection 0-9 Units, 0-9 Units, Subcutaneous, TID WC, Shaune Pollack, MD, 3 Units at 01/15/16 1209 .  isosorbide mononitrate (IMDUR) 24 hr tablet 30 mg, 30 mg, Oral, Daily, Shaune Pollack, MD, 30 mg at 01/15/16 0809 .  linaclotide (LINZESS) capsule 290 mcg, 290 mcg, Oral, QAC breakfast, Shaune Pollack, MD, 290 mcg at 01/15/16 0810 .  loratadine (CLARITIN) tablet 10 mg, 10 mg, Oral, Daily, Shaune Pollack, MD, 10 mg at 01/15/16 0810 .  mirtazapine (REMERON) tablet 30 mg, 30 mg, Oral, QHS, Shaune Pollack, MD, 30 mg at 01/14/16 2213 .  mometasone-formoterol (DULERA) 200-5 MCG/ACT inhaler 2 puff, 2 puff, Inhalation, BID, Shaune Pollack, MD, 2 puff at 01/15/16 0813 .  morphine 2 MG/ML injection 2 mg, 2 mg, Intravenous, Q4H PRN, Shaune Pollack, MD .  nitroGLYCERIN (NITROSTAT) SL tablet 0.4 mg, 0.4 mg, Sublingual, Q5 min PRN, Shaune Pollack, MD .  ondansetron [facility]) tablet 4 mg, 4 mg, Oral, Q6H PRN **OR** ondansetron (ZOFRAN) injection 4 mg, 4 mg, Intravenous, Q6H PRN, Shaune Pollack, MD .  pantoprazole (PROTONIX) EC tablet 40 mg, 40 mg, Oral, Daily, Shaune Pollack, MD, 40 mg at 01/15/16 0809 .  senna-docusate (Senokot-S) tablet 1 tablet, 1 tablet, Oral, QHS PRN, Shaune Pollack, MD .  tiotropium [facility]) inhalation capsule 18 mcg, 18 mcg, Inhalation, Daily, Shaune Pollack, MD, 18 mcg at 01/15/16 0813 .  traMADol (ULTRAM) tablet 50 mg, 50 mg, Oral, BID, Shaune Pollack, MD, 50 mg at 01/15/16 0950 .  zolpidem (AMBIEN) tablet 5 mg, 5 mg, Oral, QHS PRN,MR X 1, Qing  Imogene Burnhen, MD   Recent Labs  01/13/16 1419 01/14/16 1441  WBC 7.9  7.9  --   HGB 12.0*  12.1*  --   HCT 34.8*  34.7*  34.3*  --   PLT 191  189  --   INR  --  1.06    MRI head without contrast: IMPRESSION: Chronic small-vessel ischemic changes, advanced for age. No acute or reversible finding.  Patient was placed in the lateral decub position.  Area was cleaned with betadine and anesthetized with lidocaine.  Under sterile conditions 20G LP needle was placed at approximately L3-4 without difficulty.   Opening pressure was documented at 12.  Approximately 20cc of clear, colorless fluid was obtained and sent for studies.  No complications were noted.    Thana FarrLeslie Vivi Piccirilli, MD Neurology [phone] 01/15/2016  12:52 PM

## 2016-01-15 NOTE — Progress Notes (Signed)
[facility] - [place] at [facility]   PATIENT NAME: Brian Hampton    MRN#:  [id number]  DATE OF BIRTH:  17-Apr-1956  SUBJECTIVE:  Hospital Day: none Brian Hampton is a 60 y.o. male presenting with Fall .   Overnight events: No overnight events Interval Events: Still complains of lower extremity weakness unchanged since admission  REVIEW OF SYSTEMS:  CONSTITUTIONAL: No fever,Positive fatigue or weakness.  EYES: No blurred or double vision.  EARS, NOSE, AND THROAT: No tinnitus or ear pain.  RESPIRATORY: No cough, shortness of breath, wheezing or hemoptysis.  CARDIOVASCULAR: No chest pain, orthopnea, edema.  GASTROINTESTINAL: No nausea, vomiting, diarrhea or abdominal pain.  GENITOURINARY: No dysuria, hematuria.  ENDOCRINE: No polyuria, nocturia,  HEMATOLOGY: No anemia, easy bruising or bleeding SKIN: No rash or lesion. MUSCULOSKELETAL: No joint pain or arthritis.   NEUROLOGIC: No tingling, numbness, positive weakness.  PSYCHIATRY: No anxiety or depression.   DRUG ALLERGIES:   Allergies  Allergen Reactions  . Aspirin Anaphylaxis  . Bee Venom Anaphylaxis  . Penicillins Anaphylaxis and Other (See Comments)    Has patient had a PCN reaction causing immediate rash, facial/tongue/throat swelling, SOB or lightheadedness with hypotension: Yes Has patient had a PCN reaction causing severe rash involving mucus membranes or skin necrosis: No Has patient had a PCN reaction that required hospitalization No Has patient had a PCN reaction occurring within the last 10 years: Yes If all of the above answers are "NO", then may proceed with Cephalosporin use.  . Prednisone Anaphylaxis  . Sulfa Antibiotics Anaphylaxis  . Sulfasalazine Anaphylaxis  . Theophyllines Anaphylaxis    VITALS:  Blood pressure 155/92, pulse 77, temperature 98.3 F (36.8 C), temperature source Oral, resp. rate 22, height 5\' 6"  (1.676 m), weight 160 lb (72.576 kg), SpO2 98 %.  PHYSICAL  EXAMINATION:  VITAL SIGNS: Filed Vitals:   01/15/16 0331 01/15/16 0739  BP: 149/96 155/92  Pulse: 65 77  Temp: 98.1 F (36.7 C) 98.3 F (36.8 C)  Resp: 18 22   GENERAL:59 y.o.male currently in no acute distress.  HEAD: Normocephalic, atraumatic.  EYES: Pupils equal, round, reactive to light. Extraocular muscles intact. No scleral icterus.  MOUTH: Moist mucosal membrane. Dentition intact. No abscess noted.  EAR, NOSE, THROAT: Clear without exudates. No external lesions.  NECK: Supple. No thyromegaly. No nodules. No JVD.  PULMONARY: Clear to ascultation, without wheeze rails or rhonci. No use of accessory muscles, Good respiratory effort. good air entry bilaterally CHEST: Nontender to palpation.  CARDIOVASCULAR: S1 and S2. Regular rate and rhythm. No murmurs, rubs, or gallops. No edema. Pedal pulses 2+ bilaterally.  GASTROINTESTINAL: Soft, nontender, nondistended. No masses. Positive bowel sounds. No hepatosplenomegaly.  MUSCULOSKELETAL: No swelling, clubbing, or edema. Range of motion full in all extremities.  NEUROLOGIC: Cranial nerves II through XII are intact. Strength proximal lower extremity bilateral 3/5 distal strength 4/5 this includes flexion and extension remainder of strength testing within normal limits or negative drift within normal limits patient does appear to have a resting tremor Sensation intact. Reflexes intact.  SKIN: No ulceration, lesions, rashes, or cyanosis. Skin warm and dry. Turgor intact.  PSYCHIATRIC: Mood, affect within normal limits. The patient is awake, alert and oriented x 3. Insight, judgment intact.      LABORATORY PANEL:   CBC  Recent Labs Lab 01/13/16 1419  WBC 7.9  7.9  HGB 12.0*  12.1*  HCT 34.8*  34.7*  34.3*  PLT 191  189   ------------------------------------------------------------------------------------------------------------------  Chemistries  Recent Labs Lab 01/13/16 1419 01/14/16 0441  NA 139 136  K 4.5 4.4  CL  111 105  CO2 20* 24  GLUCOSE 177* 206*  BUN 33* 30*  CREATININE 1.17 0.97  CALCIUM 8.4* 8.2*  AST 36  --   ALT 56  --   ALKPHOS 75  --   BILITOT 0.3  --    ------------------------------------------------------------------------------------------------------------------  Cardiac Enzymes No results for input(s): TROPONINI in the last 168 hours. ------------------------------------------------------------------------------------------------------------------  RADIOLOGY:  Mr Brain Wo Contrast  01/14/2016  CLINICAL DATA:  Bilateral lower extremity weakness, 2 weeks duration. Gait disturbance with falling. EXAM: MRI HEAD WITHOUT CONTRAST TECHNIQUE: Multiplanar, multiecho pulse sequences of the brain and surrounding structures were obtained without intravenous contrast. COMPARISON:  None. FINDINGS: Diffusion imaging does not show any acute or subacute infarction. There are mild chronic small-vessel ischemic changes of the pons. No focal cerebellar insult. Within the cerebral hemispheres, there are moderate changes of chronic small vessel disease within the deep and subcortical white matter, advanced for age. No cortical or large vessel territory infarction. No mass lesion, hemorrhage, hydrocephalus or extra-axial collection. No pituitary mass. No inflammatory sinus disease. No skull or skullbase lesion. IMPRESSION: Chronic small-vessel ischemic changes, advanced for age. No acute or reversible finding. Electronically Signed   By: Paulina FusiMark  Shogry M.D.   On: 01/14/2016 11:07   Mr Cervical Spine Wo Contrast  01/14/2016  CLINICAL DATA:  Bilateral lower extremity weakness and pain, 2 weeks duration. Gait disturbance with some falling. EXAM: MRI CERVICAL SPINE WITHOUT CONTRAST TECHNIQUE: Multiplanar, multisequence MR imaging of the cervical spine was performed. No intravenous contrast was administered. COMPARISON:  None. FINDINGS: Alignment: Normal Vertebrae: No fracture or focal lesion Cord: No compression.  Normal signal characteristics. No cord lesion. Posterior Fossa, vertebral arteries, paraspinal tissues: Unremarkable Disc levels: Foramen magnum is widely patent. 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 C2-3:  Normal interspace. C3-4: Mild bilateral uncovertebral hypertrophy. No significant canal or foraminal narrowing. C4-5: Endplate osteophytes mild bulging of the disc. No significant canal or foraminal narrowing. C5-6: No disc pathology. Mild facet degeneration and hypertrophy on the left. No stenosis. C6-7: Mild bulging of the disc. Mild facet hypertrophy on the left. No canal or foraminal stenosis. C7-T1:  Normal interspace. Upper thoracic region shows an old minor compression deformity at T3. Mild bulging of the T2-3 disc. No neural compression. IMPRESSION: No likely significant finding in the cervical spine. Minor degenerative changes as outlined above. No cord compression or cord lesion. Electronically Signed   By: Paulina FusiMark  Shogry M.D.   On: 01/14/2016 11:11   Mr Thoracic Spine Wo Contrast  01/13/2016  CLINICAL DATA:  60 year old male with 3 falls in the last 2 days. Bilateral lower extremity weakness. Initial encounter. EXAM: MRI THORACIC SPINE WITHOUT CONTRAST TECHNIQUE: Multiplanar, multisequence MR imaging of the thoracic spine was performed. No intravenous contrast was administered. COMPARISON:  Lumbar MRI from today reported separately. Chest CTA 06/24/2015. Thoracic spine MRI 07/08/2011. FINDINGS: Alignment: Appears stable since 2012 with mild levoconvex upper thoracic scoliosis. Chronic T8 compression fracture is stable. Chronic anterior interbody ankylosis at T4-T5. Vertebrae: Normal bone marrow signal. No marrow edema or evidence of acute osseous abnormality. Cord: Spinal cord signal is within normal limits at all visualized levels. Conus medullaris at T12-L1. Paraspinal and other soft tissues: Negative thoracic paraspinal soft tissues. Negative  visualized thoracic and upper abdominal viscera. Disc levels: Widespread chronic thoracic disc desiccation with intermittent mild disc bulging and endplate spurring has  not significantly changed since 2012 and results in no thoracic spinal or foraminal stenosis. IMPRESSION: Stable MRI appearance of the thoracic spine since 2012 with no thoracic spinal stenosis or neural impingement. Electronically Signed   By: Odessa Fleming M.D.   On: 01/13/2016 18:21   Mr Lumbar Spine Wo Contrast  01/13/2016  CLINICAL DATA:  60 year old male with 3 falls in 2 days. Bilateral lower extremity weakness. Tremor. Initial encounter. EXAM: MRI LUMBAR SPINE WITHOUT CONTRAST TECHNIQUE: Multiplanar, multisequence MR imaging of the lumbar spine was performed. No intravenous contrast was administered. COMPARISON:  Thoracic spine MRI from today reported separately. CT Abdomen and Pelvis 10/02/2011. FINDINGS: Segmentation: Normal lumbar segmentation as seen on the 2013 comparison. Alignment: Mild straightening of lower lumbar lordosis, otherwise within normal limits. Vertebrae: Normal bone marrow signal. No marrow edema or evidence of acute osseous abnormality. Visible sacrum intact. Conus medullaris: Extends to the T12-L1 level and appears normal. Paraspinal and other soft tissues: Negative posterior paraspinal soft tissues aside from some paraspinal muscle atrophy. Negative visualized abdominal viscera. Partially visible urinary bladder distension. Disc levels: Chronic disc desiccation in the lower thoracic spine and upper lumbar spine. There is also chronic disc desiccation and disc space loss at L5-S1 with a small central disc protrusion. In the lower lumbar spine there is moderate facet degeneration at L4-L5 greater on the right. There is mild lumbar epidural lipomatosis at most levels, complete epidural lipomatosis at L5-S1 effaces CSF from the thecal sac at that level. There is otherwise no lumbar spinal stenosis. No lumbar lateral recess  stenosis. Isolated mild left L5 lumbar neural foraminal stenosis related to disc and endplate degeneration. IMPRESSION: 1.  No acute osseous abnormality in the lumbar spine. 2. Lumbar spine degeneration but no subsequent spinal or lateral recess stenosis. There is mild left L5 neural foraminal stenosis. Epidural lipomatosis, maximal at L5-S1. 3. Partially visible urinary bladder distension. If the patient is unable to void consider bladder catheterization. Electronically Signed   By: Odessa Fleming M.D.   On: 01/13/2016 18:40    EKG:   Orders placed or performed during the hospital encounter of 01/13/16  . ED EKG  . ED EKG  . EKG 12-Lead  . EKG 12-Lead    ASSESSMENT AND PLAN:   Brian Hampton is a 60 y.o. male presenting with Fall . Admitted 01/13/2016 : Day #: none   1. Bilateral lower extremity weakness. Neuro input Appreciated PT/OT 2. HIV:  Infectious disease input appreciated  2. Essential hypertension, Coreg 3. Type 2 diabetes non-insulin-requiring check hemoglobin A1c. Continue sliding coverage 4. GERD without esophagitis PPI therapy     All the records are reviewed and case discussed with Care Management/Social Workerr. Management plans discussed with the patient, family and they are in agreement.  CODE STATUS: full TOTAL TIME TAKING CARE OF THIS PATIENT: 28 minutes.   POSSIBLE D/C IN 1-2DAYS, DEPENDING ON CLINICAL CONDITION.   Najiyah Paris,  Mardi Mainland.D on 01/15/2016 at 1:47 PM  Between 7am to 6pm - Pager - [phone]  After 6pm: House Pager: - [phone]  [facility]  Office  [phone]  CC: Primary care physician; [facility]

## 2016-01-16 DIAGNOSIS — G7289 Other specified myopathies: Secondary | ICD-10-CM | POA: Diagnosis not present

## 2016-01-16 DIAGNOSIS — I10 Essential (primary) hypertension: Secondary | ICD-10-CM | POA: Diagnosis not present

## 2016-01-16 DIAGNOSIS — E119 Type 2 diabetes mellitus without complications: Secondary | ICD-10-CM | POA: Diagnosis not present

## 2016-01-16 DIAGNOSIS — R29898 Other symptoms and signs involving the musculoskeletal system: Secondary | ICD-10-CM | POA: Diagnosis not present

## 2016-01-16 DIAGNOSIS — Z21 Asymptomatic human immunodeficiency virus [HIV] infection status: Secondary | ICD-10-CM | POA: Diagnosis not present

## 2016-01-16 LAB — GLUCOSE, CAPILLARY
GLUCOSE-CAPILLARY: 174 mg/dL — AB (ref 65–99)
GLUCOSE-CAPILLARY: 211 mg/dL — AB (ref 65–99)

## 2016-01-16 LAB — VDRL, CSF: SYPHILIS VDRL QUANT CSF: NONREACTIVE

## 2016-01-16 LAB — CREATININE, SERUM
Creatinine, Ser: 1.12 mg/dL (ref 0.61–1.24)
GFR calc non Af Amer: >60 mL/min (ref >60)

## 2016-01-16 NOTE — Discharge Summary (Signed)
[facility] - [place] at [facility]   PATIENT NAME: Brian Hampton    MR#:  [id number]  DATE OF BIRTH:  [birth year]  DATE OF ADMISSION:  01/13/2016 ADMITTING PHYSICIAN: Shaune Pollack, MD  DATE OF DISCHARGE: 01/16/2016  PRIMARY CARE PHYSICIAN: [facility]    ADMISSION DIAGNOSIS:  Weakness [R53.1] Leg weakness [R29.898]  DISCHARGE DIAGNOSIS:  Principal Problem:   Leg weakness, bilateral   SECONDARY DIAGNOSIS:   Past Medical History  Diagnosis Date  . Asthma   . COPD (chronic obstructive pulmonary disease) (HCC)   . Coronary artery disease   . Lung mass   . Renal disorder   . Emphysema/COPD (HCC)   . Myocardial infarction (HCC)     in 2000  . Diabetes mellitus without complication [facility])     HOSPITAL COURSE:  Theador Jezewski  is a 60 y.o. male admitted 01/13/2016 with chief complaint Fall . Please see H&P performed by Shaune Pollack, MD for further information. Patient presented with the above symptoms. Evaluation by neurology and infectious disease during his stay. Workup negative for infectious cause. 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 He should undergo NCV/EMG studies as an outpatient  DISCHARGE CONDITIONS:   stable  CONSULTS OBTAINED:  Treatment Team:  Thana Farr, MD Mick Sell, MD  DRUG ALLERGIES:   Allergies  Allergen Reactions  . Aspirin Anaphylaxis  . Bee Venom Anaphylaxis  . Penicillins Anaphylaxis and Other (See Comments)    Has patient had a PCN reaction causing immediate rash, facial/tongue/throat swelling, SOB or lightheadedness with hypotension: Yes Has patient had a PCN reaction causing severe rash involving mucus membranes or skin necrosis: No Has patient had a PCN reaction that required hospitalization No Has patient had a PCN reaction occurring within the last 10 years: Yes If all of the above answers are "NO", then may proceed with Cephalosporin use.  . Prednisone  Anaphylaxis  . Sulfa Antibiotics Anaphylaxis  . Sulfasalazine Anaphylaxis  . Theophyllines Anaphylaxis    DISCHARGE MEDICATIONS:   Current Discharge Medication List    CONTINUE these medications which have NOT CHANGED   Details  albuterol (PROVENTIL HFA;VENTOLIN HFA) 108 (90 BASE) MCG/ACT inhaler Inhale 2 puffs into the lungs every 6 (six) hours as needed for wheezing or shortness of breath.    albuterol (PROVENTIL) (2.5 MG/3ML) 0.083% nebulizer solution Take 2.5 mg by nebulization every 6 (six) hours as needed for wheezing or shortness of breath.    atorvastatin (LIPITOR) 40 MG tablet Take 40 mg by mouth at bedtime.    Calcium Carbonate-Vitamin D (CALCIUM 600+D) 600-400 MG-UNIT tablet Take 1 tablet by mouth 2 (two) times daily.    carvedilol (COREG) 12.5 MG tablet Take 12.5 mg by mouth 2 (two) times daily with a meal.    cetirizine (ZYRTEC) 10 MG tablet Take 10 mg by mouth daily.    clonazePAM (KLONOPIN) 0.5 MG tablet Take 0.5 mg by mouth 2 (two) times daily as needed for anxiety.    elvitegravir-cobicistat-emtricitabine-tenofovir (GENVOYA) 150-150-200-10 MG TABS tablet Take 1 tablet by mouth at bedtime.    EPINEPHrine (EPIPEN 2-PAK) 0.3 mg/0.3 mL IJ SOAJ injection Inject 0.3 mg into the muscle once as needed (for severe allergic reaction).    escitalopram (LEXAPRO) 20 MG tablet Take 20 mg by mouth daily.    fluconazole (DIFLUCAN) 200 MG tablet Take 200 mg by mouth daily.    fluticasone (FLONASE) 50 MCG/ACT nasal spray Place 1 spray into  both nostrils daily.    gemfibrozil (LOPID) 600 MG tablet Take 600 mg by mouth 2 (two) times daily before a meal.    isosorbide mononitrate (IMDUR) 30 MG 24 hr tablet Take 30 mg by mouth daily.    linaclotide (LINZESS) 290 MCG CAPS capsule Take 290 mcg by mouth daily before breakfast.    meloxicam (MOBIC) 7.5 MG tablet Take 7.5 mg by mouth 2 (two) times daily.    mirtazapine (REMERON) 30 MG tablet Take 30 mg by mouth at bedtime.      mometasone-formoterol (DULERA) 200-5 MCG/ACT AERO Inhale 2 puffs into the lungs 2 (two) times daily.     Multiple Vitamin (MULTIVITAMIN WITH MINERALS) TABS tablet Take 1 tablet by mouth daily.    nitroGLYCERIN (NITROSTAT) 0.4 MG SL tablet Place 0.4 mg under the tongue every 5 (five) minutes as needed for chest pain.    omeprazole (PRILOSEC) 20 MG capsule Take 20 mg by mouth 2 (two) times daily before a meal.    tiotropium (SPIRIVA) 18 MCG inhalation capsule Place 18 mcg into inhaler and inhale daily.    traMADol (ULTRAM) 50 MG tablet Take 50 mg by mouth 2 (two) times daily.    zolpidem (AMBIEN CR) 12.5 MG CR tablet Take 12.5 mg by mouth at bedtime as needed for sleep.         DISCHARGE INSTRUCTIONS:    DIET:  Regular diet  DISCHARGE CONDITION:  Stable  ACTIVITY:  Activity as tolerated  OXYGEN:  Home Oxygen: No.   Oxygen Delivery: room air  DISCHARGE LOCATION:  home   If you experience worsening of your admission symptoms, develop shortness of breath, life threatening emergency, suicidal or homicidal thoughts you must seek medical attention immediately by calling 911 or calling your MD immediately  if symptoms less severe.  You Must read complete instructions/literature along with all the possible adverse reactions/side effects for all the Medicines you take and that have been prescribed to you. Take any new Medicines after you have completely understood and accpet all the possible adverse reactions/side effects.   Please note  You were cared for by a hospitalist during your hospital stay. If you have any questions about your discharge medications or the care you received while you were in the hospital after you are discharged, you can call the unit and asked to speak with the hospitalist on call if the hospitalist that took care of you is not available. Once you are discharged, your primary care physician will handle any further medical issues. Please note that NO  REFILLS for any discharge medications will be authorized once you are discharged, as it is imperative that you return to your primary care physician (or establish a relationship with a primary care physician if you do not have one) for your aftercare needs so that they can reassess your need for medications and monitor your lab values.    On the day of Discharge:   VITAL SIGNS:  Blood pressure 164/99, pulse 69, temperature 98.2 F (36.8 C), temperature source Oral, resp. rate 18, height 5\' 6"  (1.676 m), weight 160 lb (72.576 kg), SpO2 99 %.  I/O:   Intake/Output Summary (Last 24 hours) at 01/16/16 1140 Last data filed at 01/16/16 1037  Gross per 24 hour  Intake   1080 ml  Output      1 ml  Net   1079 ml    PHYSICAL EXAMINATION:  GENERAL:  60 y.o.-year-old patient lying in the bed with no acute distress.  EYES: Pupils equal, round, reactive to light and accommodation. No scleral icterus. Extraocular muscles intact.  HEENT: Head atraumatic, normocephalic. Oropharynx and nasopharynx clear.  NECK:  Supple, no jugular venous distention. No thyroid enlargement, no tenderness.  LUNGS: Normal breath sounds bilaterally, no wheezing, rales,rhonchi or crepitation. No use of accessory muscles of respiration.  CARDIOVASCULAR: S1, S2 normal. No murmurs, rubs, or gallops.  ABDOMEN: Soft, non-tender, non-distended. Bowel sounds present. No organomegaly or mass.  EXTREMITIES: No pedal edema, cyanosis, or clubbing.  NEUROLOGIC: Cranial nerves II through XII are intact. Muscle strength 4/5 in all extremities. Sensation intact. Gait not checked.  PSYCHIATRIC: The patient is alert and oriented x 3.  SKIN: No obvious rash, lesion, or ulcer.   DATA REVIEW:   CBC  Recent Labs Lab 01/13/16 1419 01/14/16 1441  WBC 7.9  7.9 7.5  HGB 12.0*  12.1*  --   HCT 34.8*  34.7*  34.3* 35.3*  PLT 191  189 215    Chemistries   Recent Labs Lab 01/13/16 1419 01/14/16 0441 01/16/16 0634  NA 139 136   --   K 4.5 4.4  --   CL 111 105  --   CO2 20* 24  --   GLUCOSE 177* 206*  --   BUN 33* 30*  --   CREATININE 1.17 0.97 1.12  CALCIUM 8.4* 8.2*  --   AST 36  --   --   ALT 56  --   --   ALKPHOS 75  --   --   BILITOT 0.3  --   --     Cardiac Enzymes No results for input(s): TROPONINI in the last 168 hours.  Microbiology Results  Results for orders placed or performed during the hospital encounter of 01/13/16  CSF culture     Status: None (Preliminary result)   Collection Time: 01/15/16 12:19 PM  Result Value Ref Range Status   Specimen Description CSF  Final   Special Requests Immunocompromised  Final   Gram Stain NO ORGANISMS SEEN NO WBC SEEN   Final   Culture   Final    NO GROWTH < 24 HOURS Performed at [facility]    Report Status PENDING  Incomplete  Culture, fungus without smear ([facility]-Only)     Status: None (Preliminary result)   Collection Time: 01/15/16 12:19 PM  Result Value Ref Range Status   Specimen Description CSF  Final   Special Requests Immunocompromised  Final   Culture   Final    NO FUNGUS ISOLATED AFTER 1 DAY Performed at [facility]    Report Status PENDING  Incomplete    RADIOLOGY:  No results found.   Management plans discussed with the patient, family and they are in agreement.  CODE STATUS:     Code Status Orders        Start     Ordered   01/13/16 2147  Full code   Continuous     01/13/16 2146    Code Status History    Date Active Date Inactive Code Status Order ID Comments User Context   06/24/2015  7:13 AM 06/25/2015 12:27 PM Full Code [id number]  Milagros Loll, MD ED      TOTAL TIME TAKING CARE OF THIS PATIENT: 28 minutes.    Schae Cando,  Mardi Mainland.D on 01/16/2016 at 11:40 AM  Between 7am to 6pm - Pager - [phone]  After 6pm go to [email or web address] - Therapist, [facility] Hospitalists  Office  [phone]  CC: Primary care physician; [facility]

## 2016-01-16 NOTE — Progress Notes (Signed)
Subjective: Patient reports that his pain is improved but his legs remain weak.  No development of bowel/bladder incontinence.  Rates pain at a 8/10.  Objective: Current vital signs: BP 164/99 mmHg  Pulse 69  Temp(Src) 98.2 F (36.8 C) (Oral)  Resp 18  Ht '5\' 6"'$  (1.676 m)  Wt 72.576 kg (160 lb)  BMI 25.84 kg/m2  SpO2 99% Vital signs in last 24 hours: Temp:  [98.2 F (36.8 C)-98.3 F (36.8 C)] 98.2 F (36.8 C) (06/09 0310) Pulse Rate:  [69-83] 69 (06/09 0809) Resp:  [18] 18 (06/09 0310) BP: (144-164)/(85-99) 164/99 mmHg (06/09 0809) SpO2:  [96 %-99 %] 99 % (06/09 0809)  Intake/Output from previous day: 06/08 0701 - 06/09 0700 In: 1080 [P.O.:1080] Out: 0  Intake/Output this shift: Total I/O In: 240 [P.O.:240] Out: 1 [Urine:1] Nutritional status: Diet heart healthy/carb modified Room service appropriate?: Yes; Fluid consistency:: Thin  Neurologic Exam: Mental Status: Alert, oriented, thought content appropriate. Speech fluent without evidence of aphasia. Able to follow 3 step commands without difficulty. Cranial Nerves: II: Discs flat bilaterally; Visual fields grossly normal, pupils equal, round, reactive to light and accommodation III,IV, VI: ptosis not present, extra-ocular motions intact bilaterally V,VII: smile symmetric, facial light touch sensation normal bilaterally, left facial tremor VIII: hearing normal bilaterally IX,X: gag reflex present XI: bilateral shoulder shrug XII: midline tongue extension Motor: Able to lift all extremities against gravity but has significant give-way due to pain in both the upper and lower extremities.  Sensory: Pinprick and light touch intact throughout, bilaterally   Lab Results: Basic Metabolic Panel:  Recent Labs Lab 01/13/16 1419 01/14/16 0441 01/16/16 0634  NA 139 136  --   K 4.5 4.4  --   CL 111 105  --   CO2 20* 24  --   GLUCOSE 177* 206*  --   BUN 33* 30*  --   CREATININE 1.17 0.97 1.12  CALCIUM 8.4* 8.2*   --     Liver Function Tests:  Recent Labs Lab 01/13/16 1419  AST 36  ALT 56  ALKPHOS 75  BILITOT 0.3  PROT 6.4*  ALBUMIN 2.8*   No results for input(s): LIPASE, AMYLASE in the last 168 hours. No results for input(s): AMMONIA in the last 168 hours.  CBC:  Recent Labs Lab 01/13/16 1419 01/14/16 1441  WBC 7.9  7.9 7.5  NEUTROABS 7.1* 6.6  HGB 12.0*  12.1*  --   HCT 34.8*  34.7*  34.3* 35.3*  MCV 100.0  99.3 96  PLT 191  189 215    Cardiac Enzymes:  Recent Labs Lab 01/13/16 1419 01/14/16 0441  CKTOTAL 377 292    Lipid Panel: No results for input(s): CHOL, TRIG, HDL, CHOLHDL, VLDL, LDLCALC in the last 168 hours.  CBG:  Recent Labs Lab 01/15/16 1106 01/15/16 1612 01/15/16 1957 01/15/16 2058 01/16/16 0810  GLUCAP 207* 219* 221* 213* 174*    Microbiology: Results for orders placed or performed during the hospital encounter of 01/13/16  CSF culture     Status: None (Preliminary result)   Collection Time: 01/15/16 12:19 PM  Result Value Ref Range Status   Specimen Description CSF  Final   Special Requests Immunocompromised  Final   Gram Stain NO ORGANISMS SEEN NO WBC SEEN   Final   Culture PENDING  Incomplete   Report Status PENDING  Incomplete    Coagulation Studies:  Recent Labs  01/14/16 1441  LABPROT 14.0  INR 1.06    Imaging: No results  found.  Medications:  I have reviewed the patient's current medications. Scheduled: . carvedilol  12.5 mg Oral BID WC  . elvitegravir-cobicistat-emtricitabine-tenofovir  1 tablet Oral QHS  . enoxaparin (LOVENOX) injection  40 mg Subcutaneous Q24H  . escitalopram  20 mg Oral Daily  . fluconazole  200 mg Oral Daily  . fluticasone  1 spray Each Nare Daily  . insulin aspart  0-5 Units Subcutaneous QHS  . insulin aspart  0-9 Units Subcutaneous TID WC  . isosorbide mononitrate  30 mg Oral Daily  . linaclotide  290 mcg Oral QAC breakfast  . loratadine  10 mg Oral Daily  . mirtazapine  30 mg  Oral QHS  . mometasone-formoterol  2 puff Inhalation BID  . pantoprazole  40 mg Oral Daily  . tiotropium  18 mcg Inhalation Daily    Assessment/Plan: Patient's symptoms continue.  MRI's show no evidence of cord compression.  MRI of the brain shows no causative pathology.  LP unremarkable with protein 33, glucose 104.  No wbcs or rbcs.  VDRL pending but RPR negative.  ANA negative, CRP normal, ceruloplasmin unremarkable, ESR 56.  Heavy metal screen unremarkable.  TSH abnormal at 0.092.  ESR 56.  A1c at 6.2.  B12 low.  Weakness may be related to these abnormalities but further work up will need to be performed on an outpatient basis.    Recommendations: 1. Agree with B12 supplementation 2. Agree with addressing thyroid abnormalities 3. No further inpatient evaluation recommended at this time.  Patient to continue therapy and to have NCV/EMG as an outpatient.       Alexis Goodell, MD Neurology [phone] 01/16/2016  10:42 AM

## 2016-01-16 NOTE — Care Management (Addendum)
Unable to schedule outpatient neuro conductions test recommended by [facility] because patient's insurance requires medical necessity from PCP. Patient will need to follow up with PCP for this to be arranged; unit clerk has made follow up appointment with PCP for patient. No further RNCM needs. [facility] aware of patient discharge.

## 2016-01-16 NOTE — Progress Notes (Signed)
Physical Therapy Treatment Patient Details Name: Brian Hampton MRN: [id number] DOB: 06/27/1956 Today's Date: 01/16/2016    History of Present Illness Pt is a 60 y/o male that presents with progressive onset of LE weakness and increasing number of falls. 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 He is a well controlled longstanding HIV patient admitted 6/6 with falls and bil LE weakness and pain for 2 weeks. On admit was afebrile, wbc nml, CMP nml x low albumin, mild anemia, low B12. CK nml.  Hx of COPD, asthma, DM, renal disorder, lung mass and MI. He lives at a boarding house.    PT Comments    Pt demonstrates good safety and stability with all mobility. He is able to safely ascend/descend stairs with unilateral railing. HR increases during ambulation to 116 but pt denies DOE, chest pain, or palpitations. Pt able to safely ambulate short distance without assistive device but more functional with rolling walker due to impaired single leg balance. Pt will benefit from skilled PT services to address deficits in strength, balance, and mobility in order to return to full function at home.    Follow Up Recommendations  Home health PT     Equipment Recommendations  Rolling walker with 5" wheels    Recommendations for Other Services       Precautions / Restrictions Precautions Precautions: Fall Restrictions Weight Bearing Restrictions: No    Mobility  Bed Mobility Overal bed mobility: Independent             General bed mobility comments: Good speed and sequencing noted. Improved speed  Transfers Overall transfer level: Independent Equipment used: None             General transfer comment: Pt demonstrates good speed and sequencing coming to standing. Does not require UE support for balance but elects to push off with hands for assistance  Ambulation/Gait Ambulation/Gait assistance: Supervision Ambulation Distance (Feet):  250 Feet Assistive device: Rolling walker (2 wheeled) Gait Pattern/deviations: WFL(Within Functional Limits) Gait velocity: Decreased but functional for limited household mobility   General Gait Details: Pt able to ambulate a full lap around RN station, to the stairwell,. and back to the room. He demonstrates decreased step length and speed but functional for full household and limited community mobility. Vitals monitored and HR increases to 115 bpm. Pt is asymptomatic throughout ambulation. Denies DOE. Good stability noted with rolling walker. Pt able to ambulate short distance without UE support on walker   Stairs Stairs: Yes Stairs assistance: Min guard Stair Management: Step to pattern;One rail Right Number of Stairs: 6 General stair comments: Pt able to ascend/descend 6 stairs with unilateral railing. Performs step-to pattern safely and confidently. No deficits or safety concerns identified  Wheelchair Mobility    Modified Rankin (Stroke Patients Only)       Balance Overall balance assessment: Needs assistance Sitting-balance support: No upper extremity supported Sitting balance-Leahy Scale: Good     Standing balance support: No upper extremity supported Standing balance-Leahy Scale: Good Standing balance comment: Able to maintain feet apart and feet together balance. Positive Rhomberg. single leg balance approximately 1 second                    Cognition Arousal/Alertness: Awake/alert Behavior During Therapy: WFL for tasks assessed/performed;Flat affect Overall Cognitive Status: Within Functional Limits for tasks assessed (inconsistencies noted with function.  Pt reported pain of 9/10 in LEs but wanted to walk to bathroom  to urinate vs urinal)                      Exercises      General Comments        Pertinent Vitals/Pain Pain Assessment: 0-10 Pain Score: 7  Pain Location: Bilateral LE Pain Intervention(s): Monitored during session    Home  Living                      Prior Function            PT Goals (current goals can now be found in the care plan section) Acute Rehab PT Goals Patient Stated Goal: "to be able to take care of myself again" PT Goal Formulation: With patient Time For Goal Achievement: 01/28/16 Potential to Achieve Goals: Good Progress towards PT goals: Progressing toward goals    Frequency  Min 2X/week    PT Plan Current plan remains appropriate    Co-evaluation             End of Session Equipment Utilized During Treatment: Gait belt Activity Tolerance: Patient tolerated treatment well Patient left: with call bell/phone within reach;in bed;with bed alarm set     Time: 1141-1150 PT Time Calculation (min) (ACUTE ONLY): 9 min  Charges:  $Gait Training: 8-22 mins                    G Codes:      Sharalyn InkJason D Huprich PT, DPT   Huprich,Jason 01/16/2016, 12:16 PM

## 2016-01-16 NOTE — Progress Notes (Signed)
Patient discharged home. DC instructions provided and explained. Medications reviewed. Rx given. All questions answered. Pt stable at discharge. No scripts

## 2016-01-16 NOTE — Care Management (Signed)
Notified by Jason with [facility] that patient received a walker 05/2011 and would need to pay private for a new one. Patient told Barbara CowerJason that he already has a walker and does not want to pay for another one.

## 2016-01-16 NOTE — Plan of Care (Signed)
Problem: Safety: Goal: Ability to remain free from injury will improve Outcome: Progressing No injury reported or observed   

## 2016-01-19 LAB — CSF CULTURE
CULTURE: NO GROWTH
GRAM STAIN: NONE SEEN

## 2016-01-19 LAB — CSF CULTURE W GRAM STAIN

## 2016-01-20 DIAGNOSIS — Z79899 Other long term (current) drug therapy: Secondary | ICD-10-CM | POA: Diagnosis not present

## 2016-01-20 DIAGNOSIS — J45909 Unspecified asthma, uncomplicated: Secondary | ICD-10-CM | POA: Diagnosis not present

## 2016-01-20 DIAGNOSIS — I10 Essential (primary) hypertension: Secondary | ICD-10-CM | POA: Diagnosis not present

## 2016-01-20 DIAGNOSIS — Z9181 History of falling: Secondary | ICD-10-CM | POA: Diagnosis not present

## 2016-01-20 DIAGNOSIS — E119 Type 2 diabetes mellitus without complications: Secondary | ICD-10-CM | POA: Diagnosis not present

## 2016-01-20 DIAGNOSIS — M4806 Spinal stenosis, lumbar region: Secondary | ICD-10-CM | POA: Diagnosis not present

## 2016-01-20 DIAGNOSIS — J449 Chronic obstructive pulmonary disease, unspecified: Secondary | ICD-10-CM | POA: Diagnosis not present

## 2016-01-20 DIAGNOSIS — Z7951 Long term (current) use of inhaled steroids: Secondary | ICD-10-CM | POA: Diagnosis not present

## 2016-01-20 DIAGNOSIS — M6281 Muscle weakness (generalized): Secondary | ICD-10-CM | POA: Diagnosis not present

## 2016-01-20 DIAGNOSIS — I252 Old myocardial infarction: Secondary | ICD-10-CM | POA: Diagnosis not present

## 2016-01-20 DIAGNOSIS — I2510 Atherosclerotic heart disease of native coronary artery without angina pectoris: Secondary | ICD-10-CM | POA: Diagnosis not present

## 2016-01-20 DIAGNOSIS — B20 Human immunodeficiency virus [HIV] disease: Secondary | ICD-10-CM | POA: Diagnosis not present

## 2016-01-21 ENCOUNTER — Encounter: Payer: Self-pay | Admitting: *Deleted

## 2016-01-21 ENCOUNTER — Emergency Department
Admission: EM | Admit: 2016-01-21 | Discharge: 2016-01-21 | Disposition: A | Discharge status: Home / Self Care | Payer: Medicare Other | Attending: Emergency Medicine | Admitting: Emergency Medicine

## 2016-01-21 DIAGNOSIS — J449 Chronic obstructive pulmonary disease, unspecified: Secondary | ICD-10-CM | POA: Diagnosis not present

## 2016-01-21 DIAGNOSIS — M4806 Spinal stenosis, lumbar region: Secondary | ICD-10-CM | POA: Diagnosis not present

## 2016-01-21 DIAGNOSIS — I252 Old myocardial infarction: Secondary | ICD-10-CM | POA: Insufficient documentation

## 2016-01-21 DIAGNOSIS — E119 Type 2 diabetes mellitus without complications: Secondary | ICD-10-CM | POA: Diagnosis not present

## 2016-01-21 DIAGNOSIS — M6281 Muscle weakness (generalized): Secondary | ICD-10-CM | POA: Diagnosis not present

## 2016-01-21 DIAGNOSIS — E860 Dehydration: Secondary | ICD-10-CM | POA: Insufficient documentation

## 2016-01-21 DIAGNOSIS — Z79899 Other long term (current) drug therapy: Secondary | ICD-10-CM | POA: Insufficient documentation

## 2016-01-21 DIAGNOSIS — I10 Essential (primary) hypertension: Secondary | ICD-10-CM | POA: Diagnosis not present

## 2016-01-21 DIAGNOSIS — I2510 Atherosclerotic heart disease of native coronary artery without angina pectoris: Secondary | ICD-10-CM | POA: Diagnosis not present

## 2016-01-21 DIAGNOSIS — J45909 Unspecified asthma, uncomplicated: Secondary | ICD-10-CM | POA: Diagnosis not present

## 2016-01-21 DIAGNOSIS — Z87891 Personal history of nicotine dependence: Secondary | ICD-10-CM | POA: Diagnosis not present

## 2016-01-21 DIAGNOSIS — Z7951 Long term (current) use of inhaled steroids: Secondary | ICD-10-CM | POA: Insufficient documentation

## 2016-01-21 DIAGNOSIS — R220 Localized swelling, mass and lump, head: Secondary | ICD-10-CM | POA: Diagnosis not present

## 2016-01-21 LAB — CBC WITH DIFFERENTIAL/PLATELET
BASOS ABS: 0 10*3/uL (ref 0–0.1)
BASOS PCT: 0 %
Band Neutrophils: 2 %
Blasts: 0 %
EOS PCT: 0 %
Eosinophils Absolute: 0 10*3/uL (ref 0–0.7)
HCT: 36.9 % — ABNORMAL LOW (ref 40.0–52.0)
Hemoglobin: 12.7 g/dL — ABNORMAL LOW (ref 13.0–18.0)
LYMPHS ABS: 0.6 10*3/uL — AB (ref 1.0–3.6)
Lymphocytes Relative: 6 %
MCH: 34.4 pg — ABNORMAL HIGH (ref 26.0–34.0)
MCHC: 34.5 g/dL (ref 32.0–36.0)
MCV: 99.6 fL (ref 80.0–100.0)
METAMYELOCYTES PCT: 1 %
MONO ABS: 0.2 10*3/uL (ref 0.2–1.0)
MYELOCYTES: 0 %
Monocytes Relative: 2 %
Neutro Abs: 8.9 10*3/uL — ABNORMAL HIGH (ref 1.4–6.5)
Neutrophils Relative %: 89 %
Other: 0 %
PLATELETS: 188 10*3/uL (ref 150–440)
Promyelocytes Absolute: 0 %
RBC: 3.7 MIL/uL — ABNORMAL LOW (ref 4.40–5.90)
RDW: 13.7 % (ref 11.5–14.5)
WBC: 9.7 10*3/uL (ref 3.8–10.6)
nRBC: 0 /100 WBC

## 2016-01-21 LAB — BASIC METABOLIC PANEL
Anion gap: 10 (ref 5–15)
BUN: 41 mg/dL — AB (ref 6–20)
CALCIUM: 8.9 mg/dL (ref 8.9–10.3)
CHLORIDE: 109 mmol/L (ref 101–111)
CO2: 20 mmol/L — AB (ref 22–32)
CREATININE: 1.27 mg/dL — AB (ref 0.61–1.24)
GFR calc Af Amer: >60 mL/min (ref >60)
GFR calc non Af Amer: >60 mL/min (ref >60)
Glucose, Bld: 335 mg/dL — ABNORMAL HIGH (ref 65–99)
Potassium: 4.7 mmol/L (ref 3.5–5.1)
SODIUM: 139 mmol/L (ref 135–145)

## 2016-01-21 LAB — HLA B*5701: HLA B 5701: NEGATIVE

## 2016-01-21 MED ORDER — ACETAMINOPHEN 500 MG PO TABS
1000.0000 mg | ORAL_TABLET | Freq: Once | ORAL | Status: AC
  Administered 2016-01-21: 1000 mg via ORAL
  Filled 2016-01-21: qty 2

## 2016-01-21 MED ORDER — SODIUM CHLORIDE 0.9 % IV BOLUS (SEPSIS)
1000.0000 mL | Freq: Once | INTRAVENOUS | Status: AC
  Administered 2016-01-21: 1000 mL via INTRAVENOUS

## 2016-01-21 MED ORDER — DIPHENHYDRAMINE HCL 50 MG/ML IJ SOLN
50.0000 mg | Freq: Once | INTRAMUSCULAR | Status: AC
  Administered 2016-01-21: 50 mg via INTRAVENOUS
  Filled 2016-01-21: qty 1

## 2016-01-21 MED ORDER — FAMOTIDINE IN NACL 20-0.9 MG/50ML-% IV SOLN
20.0000 mg | Freq: Once | INTRAVENOUS | Status: AC
  Administered 2016-01-21: 20 mg via INTRAVENOUS
  Filled 2016-01-21: qty 50

## 2016-01-21 NOTE — ED Notes (Signed)
BP of 185/105, taken at 2204. Isaiah BlakesJerrie, RN notified

## 2016-01-21 NOTE — ED Notes (Signed)
Pt hypertensive MD made aware. 

## 2016-01-21 NOTE — ED Notes (Signed)
Pt to 5hallway via wheelchair.  Pt nearly fell getting out of car into wheelchair due to weakness.  Pt has swelling to face for 5 days.  Pt has diff swallowing with hoarseness.

## 2016-01-21 NOTE — ED Provider Notes (Signed)
[facility] Emergency Department Provider Note  ____________________________________________  Time seen: 7:20 PM  I have reviewed the triage vital signs and the nursing notes.   HISTORY  Chief Complaint Facial Swelling    HPI Brian Hampton is a 60 y.o. male who was recently hospitalized for difficulty walking and multiple falls, discharged from the hospital about 5 days ago. Since discharge, he saw his primary care doctor that day, and that evening he developed facial swelling. The swelling isn't but bilateral maxillae. Does not hurt. It is not red or hot. He is not having any runny nose sore throat or cough. No chest pain or shortness of breath. He is able to eat and drink normally but has had decreased fluid intake over the last few days. Never had anything like this before. No new medications or foods or other potential allergen exposures. No rash on the skin, no vomiting or diarrhea.     Past Medical History  Diagnosis Date  . Asthma   . COPD (chronic obstructive pulmonary disease) (HCC)   . Coronary artery disease   . Lung mass   . Renal disorder   . Emphysema/COPD (HCC)   . Myocardial infarction (HCC)     in 2000  . Diabetes mellitus without complication [facility])      Patient Active Problem List   Diagnosis Date Noted  . Leg weakness, bilateral 01/13/2016  . Chest pain 06/24/2015     No past surgical history on file.   Current Outpatient Rx  Name  Route  Sig  Dispense  Refill  . albuterol (PROVENTIL HFA;VENTOLIN HFA) 108 (90 BASE) MCG/ACT inhaler   Inhalation   Inhale 2 puffs into the lungs every 6 (six) hours as needed for wheezing or shortness of breath.         Marland Kitchen albuterol (PROVENTIL) (2.5 MG/3ML) 0.083% nebulizer solution   Nebulization   Take 2.5 mg by nebulization every 6 (six) hours as needed for wheezing or shortness of breath.         Marland Kitchen atorvastatin (LIPITOR) 40 MG tablet   Oral   Take 40 mg by mouth at bedtime.          . Calcium Carbonate-Vitamin D (CALCIUM 600+D) 600-400 MG-UNIT tablet   Oral   Take 1 tablet by mouth 2 (two) times daily.         . carvedilol (COREG) 12.5 MG tablet   Oral   Take 12.5 mg by mouth 2 (two) times daily with a meal.         . cetirizine (ZYRTEC) 10 MG tablet   Oral   Take 10 mg by mouth daily.         . clonazePAM (KLONOPIN) 0.5 MG tablet   Oral   Take 0.5 mg by mouth 2 (two) times daily as needed for anxiety.         . elvitegravir-cobicistat-emtricitabine-tenofovir (GENVOYA) 150-150-200-10 MG TABS tablet   Oral   Take 1 tablet by mouth at bedtime.         Marland Kitchen EPINEPHrine (EPIPEN 2-PAK) 0.3 mg/0.3 mL IJ SOAJ injection   Intramuscular   Inject 0.3 mg into the muscle once as needed (for severe allergic reaction).         Marland Kitchen escitalopram (LEXAPRO) 20 MG tablet   Oral   Take 20 mg by mouth daily.         . fluconazole (DIFLUCAN) 200 MG tablet   Oral   Take 200 mg by  mouth daily.         . fluticasone (FLONASE) 50 MCG/ACT nasal spray   Each Nare   Place 1 spray into both nostrils daily.         Marland Kitchen gemfibrozil (LOPID) 600 MG tablet   Oral   Take 600 mg by mouth 2 (two) times daily before a meal.         . isosorbide mononitrate (IMDUR) 30 MG 24 hr tablet   Oral   Take 30 mg by mouth daily.         Marland Kitchen linaclotide (LINZESS) 290 MCG CAPS capsule   Oral   Take 290 mcg by mouth daily before breakfast.         . meloxicam (MOBIC) 7.5 MG tablet   Oral   Take 7.5 mg by mouth 2 (two) times daily.         . mirtazapine (REMERON) 30 MG tablet   Oral   Take 30 mg by mouth at bedtime.         . mometasone-formoterol (DULERA) 200-5 MCG/ACT AERO   Inhalation   Inhale 2 puffs into the lungs 2 (two) times daily.          . Multiple Vitamin (MULTIVITAMIN WITH MINERALS) TABS tablet   Oral   Take 1 tablet by mouth daily.         . nitroGLYCERIN (NITROSTAT) 0.4 MG SL tablet   Sublingual   Place 0.4 mg under the tongue every 5 (five)  minutes as needed for chest pain.         Marland Kitchen omeprazole (PRILOSEC) 20 MG capsule   Oral   Take 20 mg by mouth 2 (two) times daily before a meal.         . tiotropium (SPIRIVA) 18 MCG inhalation capsule   Inhalation   Place 18 mcg into inhaler and inhale daily.         . traMADol (ULTRAM) 50 MG tablet   Oral   Take 50 mg by mouth 2 (two) times daily.         Marland Kitchen zolpidem (AMBIEN CR) 12.5 MG CR tablet   Oral   Take 12.5 mg by mouth at bedtime as needed for sleep.            Allergies Aspirin; Bee venom; Penicillins; Prednisone; Sulfa antibiotics; Sulfasalazine; and Theophyllines   Family History  Problem Relation Age of Onset  . CAD      Social History Social History  Substance Use Topics  . Smoking status: Former Games developer  . Smokeless tobacco: Never Used  . Alcohol Use: No    Review of Systems  Constitutional:   No fever or chills.  Eyes:   No vision changes.  ENT:   No sore throat. No rhinorrhea. Cardiovascular:   No chest pain. Respiratory:   No dyspnea or cough. Gastrointestinal:   Negative for abdominal pain, vomiting and diarrhea.  Genitourinary:   Negative for dysuria or difficulty urinating. Musculoskeletal:   Negative for focal pain or swelling Neurological:   Negative for headaches 10-point ROS otherwise negative.  ____________________________________________   PHYSICAL EXAM:  VITAL SIGNS: ED Triage Vitals  Enc Vitals Group     BP 01/21/16 1922 173/106 mmHg     Pulse Rate 01/21/16 1922 106     Resp 01/21/16 1922 20     Temp 01/21/16 1922 98.6 F (37 C)     Temp Source 01/21/16 1922 Oral     SpO2 01/21/16 1922 97 %  Weight 01/21/16 1922 165 lb (74.844 kg)     Height 01/21/16 1922  (1.651 m)     Head Cir --      Peak Flow --      Pain Score 01/21/16 1924 5     Pain Loc --      Pain Edu? --      Excl. in GC? --     Vital signs reviewed, nursing assessments reviewed.   Constitutional:   Alert and oriented. Well appearing  and in no distress. Eyes:   No scleral icterus. No conjunctival pallor. PERRL. EOMI.  No nystagmus. ENT   Head:   Normocephalic and atraumatic.There is symmetric nonpitting nontender swelling of the bilateral maxillary areas. No inflammatory changes.   Nose:   No congestion/rhinnorhea. No septal hematoma   Mouth/Throat:   Dry mucous membranes, no pharyngeal erythema. No peritonsillar mass. No uvula edema.   Neck:   No stridor. No SubQ emphysema. No meningismus. Hematological/Lymphatic/Immunilogical:   No cervical lymphadenopathy. Cardiovascular:   RRR. Symmetric bilateral radial and DP pulses.  No murmurs.  Respiratory:   Normal respiratory effort without tachypnea nor retractions. Breath sounds are clear and equal bilaterally. No wheezes/rales/rhonchi. Gastrointestinal:   Soft and nontender. Non distended. There is no CVA tenderness.  No rebound, rigidity, or guarding. Genitourinary:   deferred Musculoskeletal:   Nontender with normal range of motion in all extremities. No joint effusions.  No lower extremity tenderness.  No edema. Neurologic:   Normal speech and language.  CN 2-10 normal. Motor grossly intact. No gross focal neurologic deficits are appreciated.  Skin:    Skin is warm, dry and intact. No rash noted.  No petechiae, purpura, or bullae.  ____________________________________________    LABS (pertinent positives/negatives) (all labs ordered are listed, but only abnormal results are displayed) Labs Reviewed  BASIC METABOLIC PANEL - Abnormal; Notable for the following:    CO2 20 (*)    Glucose, Bld 335 (*)    BUN 41 (*)    Creatinine, Ser 1.27 (*)    All other components within normal limits  CBC WITH DIFFERENTIAL/PLATELET - Abnormal; Notable for the following:    RBC 3.70 (*)    Hemoglobin 12.7 (*)    HCT 36.9 (*)    MCH 34.4 (*)    Neutro Abs 8.9 (*)    Lymphs Abs 0.6 (*)    All other components within normal limits    ____________________________________________   EKG    ____________________________________________    RADIOLOGY    ____________________________________________   PROCEDURES   ____________________________________________   INITIAL IMPRESSION / ASSESSMENT AND PLAN / ED COURSE  Pertinent labs & imaging results that were available during my care of the patient were reviewed by me and considered in my medical decision making (see chart for details).  Patient well appearing no acute distress, presents with isolated facial swelling. No history of same. Doesn't take ACE inhibitor's. Labs look like the patient is mildly dehydrated. Patient given IV fluids and feels just fine. He is tolerating oral fluids as well. Swelling is not worsening drink 3 hour observation period in the emergency department. He has no other symptoms to suggest that this is allergic reaction or anaphylaxis. I counseled him to continue taking Benadryl every 6 hours for the next 2 days until his scheduled appointment with primary care at which point they can reevaluate his symptoms. Given usual return precautions and specifically if he has any shortness of breath or wheezing to return to  the hospital immediately. This does not appear to be an infectious process, not consistent with bradykinin mediated angioedema     ____________________________________________   FINAL CLINICAL IMPRESSION(S) / ED DIAGNOSES  Final diagnoses:  Facial swelling  Mild dehydration       Portions of this note were generated with dragon dictation software. Dictation errors may occur despite best attempts at proofreading.   Sharman CheekPhillip Darald Uzzle, MD 01/21/16 2225

## 2016-01-23 DIAGNOSIS — J45909 Unspecified asthma, uncomplicated: Secondary | ICD-10-CM | POA: Diagnosis not present

## 2016-01-23 DIAGNOSIS — E882 Lipomatosis, not elsewhere classified: Secondary | ICD-10-CM | POA: Diagnosis not present

## 2016-01-23 DIAGNOSIS — I10 Essential (primary) hypertension: Secondary | ICD-10-CM | POA: Diagnosis not present

## 2016-01-23 DIAGNOSIS — B20 Human immunodeficiency virus [HIV] disease: Secondary | ICD-10-CM | POA: Diagnosis not present

## 2016-01-23 DIAGNOSIS — D519 Vitamin B12 deficiency anemia, unspecified: Secondary | ICD-10-CM | POA: Diagnosis not present

## 2016-01-23 DIAGNOSIS — G8222 Paraplegia, incomplete: Secondary | ICD-10-CM | POA: Diagnosis not present

## 2016-01-26 DIAGNOSIS — E119 Type 2 diabetes mellitus without complications: Secondary | ICD-10-CM | POA: Diagnosis not present

## 2016-01-26 DIAGNOSIS — I2510 Atherosclerotic heart disease of native coronary artery without angina pectoris: Secondary | ICD-10-CM | POA: Diagnosis not present

## 2016-01-26 DIAGNOSIS — J449 Chronic obstructive pulmonary disease, unspecified: Secondary | ICD-10-CM | POA: Diagnosis not present

## 2016-01-26 DIAGNOSIS — M4806 Spinal stenosis, lumbar region: Secondary | ICD-10-CM | POA: Diagnosis not present

## 2016-01-26 DIAGNOSIS — M6281 Muscle weakness (generalized): Secondary | ICD-10-CM | POA: Diagnosis not present

## 2016-01-26 DIAGNOSIS — I10 Essential (primary) hypertension: Secondary | ICD-10-CM | POA: Diagnosis not present

## 2016-01-27 DIAGNOSIS — R220 Localized swelling, mass and lump, head: Secondary | ICD-10-CM | POA: Diagnosis not present

## 2016-01-27 DIAGNOSIS — R29898 Other symptoms and signs involving the musculoskeletal system: Secondary | ICD-10-CM | POA: Diagnosis not present

## 2016-01-27 DIAGNOSIS — Z21 Asymptomatic human immunodeficiency virus [HIV] infection status: Secondary | ICD-10-CM | POA: Diagnosis not present

## 2016-01-27 DIAGNOSIS — J449 Chronic obstructive pulmonary disease, unspecified: Secondary | ICD-10-CM | POA: Diagnosis not present

## 2016-01-28 DIAGNOSIS — M4806 Spinal stenosis, lumbar region: Secondary | ICD-10-CM | POA: Diagnosis not present

## 2016-01-28 DIAGNOSIS — I2510 Atherosclerotic heart disease of native coronary artery without angina pectoris: Secondary | ICD-10-CM | POA: Diagnosis not present

## 2016-01-28 DIAGNOSIS — E119 Type 2 diabetes mellitus without complications: Secondary | ICD-10-CM | POA: Diagnosis not present

## 2016-01-28 DIAGNOSIS — I10 Essential (primary) hypertension: Secondary | ICD-10-CM | POA: Diagnosis not present

## 2016-01-28 DIAGNOSIS — M6281 Muscle weakness (generalized): Secondary | ICD-10-CM | POA: Diagnosis not present

## 2016-01-28 DIAGNOSIS — J449 Chronic obstructive pulmonary disease, unspecified: Secondary | ICD-10-CM | POA: Diagnosis not present

## 2016-01-29 ENCOUNTER — Encounter: Payer: Self-pay | Admitting: *Deleted

## 2016-01-29 ENCOUNTER — Inpatient Hospital Stay
Admission: EM | Admit: 2016-01-29 | Discharge: 2016-02-02 | DRG: 091 | Disposition: A | Discharge status: Skilled Nursing Facility | Payer: Medicare Other | Attending: Internal Medicine | Admitting: Internal Medicine

## 2016-01-29 ENCOUNTER — Emergency Department: Payer: Medicare Other

## 2016-01-29 ENCOUNTER — Other Ambulatory Visit: Payer: Self-pay

## 2016-01-29 DIAGNOSIS — I2510 Atherosclerotic heart disease of native coronary artery without angina pectoris: Secondary | ICD-10-CM | POA: Diagnosis not present

## 2016-01-29 DIAGNOSIS — E785 Hyperlipidemia, unspecified: Secondary | ICD-10-CM | POA: Diagnosis not present

## 2016-01-29 DIAGNOSIS — M6259 Muscle wasting and atrophy, not elsewhere classified, multiple sites: Secondary | ICD-10-CM | POA: Diagnosis not present

## 2016-01-29 DIAGNOSIS — B20 Human immunodeficiency virus [HIV] disease: Secondary | ICD-10-CM | POA: Diagnosis present

## 2016-01-29 DIAGNOSIS — Z888 Allergy status to other drugs, medicaments and biological substances status: Secondary | ICD-10-CM

## 2016-01-29 DIAGNOSIS — G720 Drug-induced myopathy: Principal | ICD-10-CM | POA: Diagnosis present

## 2016-01-29 DIAGNOSIS — M6281 Muscle weakness (generalized): Secondary | ICD-10-CM | POA: Diagnosis not present

## 2016-01-29 DIAGNOSIS — D649 Anemia, unspecified: Secondary | ICD-10-CM

## 2016-01-29 DIAGNOSIS — R748 Abnormal levels of other serum enzymes: Secondary | ICD-10-CM | POA: Diagnosis present

## 2016-01-29 DIAGNOSIS — M6282 Rhabdomyolysis: Secondary | ICD-10-CM | POA: Diagnosis not present

## 2016-01-29 DIAGNOSIS — J449 Chronic obstructive pulmonary disease, unspecified: Secondary | ICD-10-CM

## 2016-01-29 DIAGNOSIS — E0590 Thyrotoxicosis, unspecified without thyrotoxic crisis or storm: Secondary | ICD-10-CM

## 2016-01-29 DIAGNOSIS — Z8249 Family history of ischemic heart disease and other diseases of the circulatory system: Secondary | ICD-10-CM

## 2016-01-29 DIAGNOSIS — Z882 Allergy status to sulfonamides status: Secondary | ICD-10-CM | POA: Diagnosis not present

## 2016-01-29 DIAGNOSIS — Z87891 Personal history of nicotine dependence: Secondary | ICD-10-CM | POA: Diagnosis not present

## 2016-01-29 DIAGNOSIS — D696 Thrombocytopenia, unspecified: Secondary | ICD-10-CM | POA: Diagnosis present

## 2016-01-29 DIAGNOSIS — Z7952 Long term (current) use of systemic steroids: Secondary | ICD-10-CM

## 2016-01-29 DIAGNOSIS — Z880 Allergy status to penicillin: Secondary | ICD-10-CM

## 2016-01-29 DIAGNOSIS — T380X1A Poisoning by glucocorticoids and synthetic analogues, accidental (unintentional), initial encounter: Secondary | ICD-10-CM | POA: Diagnosis not present

## 2016-01-29 DIAGNOSIS — N390 Urinary tract infection, site not specified: Secondary | ICD-10-CM | POA: Diagnosis present

## 2016-01-29 DIAGNOSIS — T380X5A Adverse effect of glucocorticoids and synthetic analogues, initial encounter: Secondary | ICD-10-CM

## 2016-01-29 DIAGNOSIS — K219 Gastro-esophageal reflux disease without esophagitis: Secondary | ICD-10-CM

## 2016-01-29 DIAGNOSIS — E119 Type 2 diabetes mellitus without complications: Secondary | ICD-10-CM | POA: Diagnosis present

## 2016-01-29 DIAGNOSIS — Z741 Need for assistance with personal care: Secondary | ICD-10-CM | POA: Diagnosis not present

## 2016-01-29 DIAGNOSIS — R1030 Lower abdominal pain, unspecified: Secondary | ICD-10-CM | POA: Diagnosis not present

## 2016-01-29 DIAGNOSIS — N170 Acute kidney failure with tubular necrosis: Secondary | ICD-10-CM | POA: Diagnosis not present

## 2016-01-29 DIAGNOSIS — I10 Essential (primary) hypertension: Secondary | ICD-10-CM | POA: Diagnosis present

## 2016-01-29 DIAGNOSIS — Z21 Asymptomatic human immunodeficiency virus [HIV] infection status: Secondary | ICD-10-CM | POA: Diagnosis not present

## 2016-01-29 DIAGNOSIS — R7401 Elevation of levels of liver transaminase levels: Secondary | ICD-10-CM

## 2016-01-29 DIAGNOSIS — I82501 Chronic embolism and thrombosis of unspecified deep veins of right lower extremity: Secondary | ICD-10-CM | POA: Diagnosis not present

## 2016-01-29 DIAGNOSIS — R1312 Dysphagia, oropharyngeal phase: Secondary | ICD-10-CM | POA: Diagnosis not present

## 2016-01-29 DIAGNOSIS — R1310 Dysphagia, unspecified: Secondary | ICD-10-CM | POA: Diagnosis not present

## 2016-01-29 DIAGNOSIS — Y92009 Unspecified place in unspecified non-institutional (private) residence as the place of occurrence of the external cause: Secondary | ICD-10-CM

## 2016-01-29 DIAGNOSIS — Z791 Long term (current) use of non-steroidal anti-inflammatories (NSAID): Secondary | ICD-10-CM

## 2016-01-29 DIAGNOSIS — R41841 Cognitive communication deficit: Secondary | ICD-10-CM | POA: Diagnosis not present

## 2016-01-29 DIAGNOSIS — R8281 Pyuria: Secondary | ICD-10-CM

## 2016-01-29 DIAGNOSIS — K589 Irritable bowel syndrome without diarrhea: Secondary | ICD-10-CM | POA: Diagnosis not present

## 2016-01-29 DIAGNOSIS — B3781 Candidal esophagitis: Secondary | ICD-10-CM | POA: Diagnosis present

## 2016-01-29 DIAGNOSIS — R109 Unspecified abdominal pain: Secondary | ICD-10-CM

## 2016-01-29 DIAGNOSIS — M4806 Spinal stenosis, lumbar region: Secondary | ICD-10-CM | POA: Diagnosis not present

## 2016-01-29 DIAGNOSIS — R531 Weakness: Secondary | ICD-10-CM | POA: Diagnosis not present

## 2016-01-29 DIAGNOSIS — I252 Old myocardial infarction: Secondary | ICD-10-CM | POA: Diagnosis not present

## 2016-01-29 DIAGNOSIS — I2609 Other pulmonary embolism with acute cor pulmonale: Secondary | ICD-10-CM | POA: Diagnosis not present

## 2016-01-29 DIAGNOSIS — R4182 Altered mental status, unspecified: Secondary | ICD-10-CM | POA: Diagnosis not present

## 2016-01-29 DIAGNOSIS — Z79899 Other long term (current) drug therapy: Secondary | ICD-10-CM

## 2016-01-29 DIAGNOSIS — Z9181 History of falling: Secondary | ICD-10-CM | POA: Diagnosis not present

## 2016-01-29 DIAGNOSIS — M79605 Pain in left leg: Secondary | ICD-10-CM | POA: Diagnosis not present

## 2016-01-29 DIAGNOSIS — Z743 Need for continuous supervision: Secondary | ICD-10-CM | POA: Diagnosis not present

## 2016-01-29 DIAGNOSIS — W19XXXA Unspecified fall, initial encounter: Secondary | ICD-10-CM | POA: Diagnosis not present

## 2016-01-29 DIAGNOSIS — G20 Parkinson's disease: Secondary | ICD-10-CM | POA: Diagnosis not present

## 2016-01-29 DIAGNOSIS — R221 Localized swelling, mass and lump, neck: Secondary | ICD-10-CM

## 2016-01-29 DIAGNOSIS — R740 Nonspecific elevation of levels of transaminase and lactic acid dehydrogenase [LDH]: Secondary | ICD-10-CM

## 2016-01-29 DIAGNOSIS — R6889 Other general symptoms and signs: Secondary | ICD-10-CM | POA: Diagnosis not present

## 2016-01-29 DIAGNOSIS — R296 Repeated falls: Secondary | ICD-10-CM | POA: Diagnosis present

## 2016-01-29 DIAGNOSIS — Z7951 Long term (current) use of inhaled steroids: Secondary | ICD-10-CM

## 2016-01-29 DIAGNOSIS — E0580 Other thyrotoxicosis without thyrotoxic crisis or storm: Secondary | ICD-10-CM | POA: Diagnosis not present

## 2016-01-29 HISTORY — DX: Anemia, unspecified: D64.9

## 2016-01-29 HISTORY — DX: Type 2 diabetes mellitus without complications: E11.9

## 2016-01-29 HISTORY — DX: Essential (primary) hypertension: I10

## 2016-01-29 HISTORY — DX: Asymptomatic human immunodeficiency virus (hiv) infection status: Z21

## 2016-01-29 HISTORY — DX: Systemic involvement of connective tissue, unspecified: M35.9

## 2016-01-29 HISTORY — DX: Human immunodeficiency virus (HIV) disease: B20

## 2016-01-29 LAB — URINALYSIS COMPLETE WITH MICROSCOPIC (ARMC ONLY)
BACTERIA UA: NONE SEEN
Bilirubin Urine: NEGATIVE
Ketones, ur: NEGATIVE mg/dL
NITRITE: NEGATIVE
PH: 5 (ref 5.0–8.0)
PROTEIN: 100 mg/dL — AB
SPECIFIC GRAVITY, URINE: 1.016 (ref 1.005–1.030)
SQUAMOUS EPITHELIAL / LPF: NONE SEEN

## 2016-01-29 LAB — ETHANOL: Alcohol, Ethyl (B): <5 mg/dL (ref <5)

## 2016-01-29 LAB — COMPREHENSIVE METABOLIC PANEL
ALBUMIN: 2.5 g/dL — AB (ref 3.5–5.0)
ALK PHOS: 103 U/L (ref 38–126)
ALT: 94 U/L — AB (ref 17–63)
AST: 48 U/L — ABNORMAL HIGH (ref 15–41)
Anion gap: 10 (ref 5–15)
BUN: 31 mg/dL — AB (ref 6–20)
CHLORIDE: 110 mmol/L (ref 101–111)
CO2: 22 mmol/L (ref 22–32)
Calcium: 8.4 mg/dL — ABNORMAL LOW (ref 8.9–10.3)
Creatinine, Ser: 1.23 mg/dL (ref 0.61–1.24)
GFR calc Af Amer: >60 mL/min (ref >60)
GFR calc non Af Amer: >60 mL/min (ref >60)
Glucose, Bld: 195 mg/dL — ABNORMAL HIGH (ref 65–99)
POTASSIUM: 4 mmol/L (ref 3.5–5.1)
SODIUM: 142 mmol/L (ref 135–145)
Total Bilirubin: 0.5 mg/dL (ref 0.3–1.2)
Total Protein: 5.4 g/dL — ABNORMAL LOW (ref 6.5–8.1)

## 2016-01-29 LAB — CBC WITH DIFFERENTIAL/PLATELET
BASOS ABS: 0 10*3/uL (ref 0–0.1)
Basophils Relative: 0 %
Eosinophils Absolute: 0 10*3/uL (ref 0–0.7)
Eosinophils Relative: 0 %
HEMATOCRIT: 31.7 % — AB (ref 40.0–52.0)
HEMOGLOBIN: 10.9 g/dL — AB (ref 13.0–18.0)
Lymphocytes Relative: 7 %
Lymphs Abs: 0.5 10*3/uL — ABNORMAL LOW (ref 1.0–3.6)
MCH: 34.3 pg — ABNORMAL HIGH (ref 26.0–34.0)
MCHC: 34.6 g/dL (ref 32.0–36.0)
MCV: 99.2 fL (ref 80.0–100.0)
MONOS PCT: 5 %
Monocytes Absolute: 0.4 10*3/uL (ref 0.2–1.0)
NEUTROS ABS: 6.6 10*3/uL — AB (ref 1.4–6.5)
NEUTROS PCT: 88 %
Platelets: 110 10*3/uL — ABNORMAL LOW (ref 150–440)
RBC: 3.19 MIL/uL — AB (ref 4.40–5.90)
RDW: 13.6 % (ref 11.5–14.5)
WBC: 7.5 10*3/uL (ref 3.8–10.6)

## 2016-01-29 LAB — GLUCOSE, CAPILLARY: GLUCOSE-CAPILLARY: 252 mg/dL — AB (ref 65–99)

## 2016-01-29 LAB — TROPONIN I: TROPONIN I: 0.06 ng/mL — AB (ref <0.031)

## 2016-01-29 LAB — AMMONIA: Ammonia: 10 umol/L (ref 9–35)

## 2016-01-29 NOTE — ED Notes (Signed)
Admitting MD at bedside.

## 2016-01-29 NOTE — ED Notes (Signed)
Called lab since multiple Rn's were unable to obtain blood. They stated they had someone who could come draw blood. This RN told them that Dr. Alphonzo LemmingsMcShane wants a rainbow with a dark green.

## 2016-01-29 NOTE — ED Notes (Signed)
Pt returned from CT via stretcher.

## 2016-01-29 NOTE — ED Notes (Signed)
Pt transported to CT via stretcher with C-collar in place.

## 2016-01-29 NOTE — ED Notes (Signed)
This Rn unsuccessful at obtaining blood.

## 2016-01-29 NOTE — ED Notes (Signed)
Informed RN of ready bed at 11:40pm

## 2016-01-29 NOTE — H&P (Signed)
PCP:   [facility]   Chief Complaint:  Weakness, falls  HPI: This is a 60 year old male with known history of HIV. He was recently admitted with weakness, workup was unrevealing. Patient was discharge with instructions to do outpatient EMG. Today he became acutely week ago at 3 AM. His weakness was left-sided both upper and lower extremities. He fell 4 times today. He states his lower extremity is numb. Denies any headache and tinnitus and blurred vision, altered mentation,'s slurred speech, facial droop. He denies any change in medications or discontinuation of her medications. He denies any recent steroid use. He states his been compliant with his home medications.  Review of Systems:  The patient denies anorexia, fever, weight loss,, vision loss, decreased hearing, hoarseness, chest pain, syncope, dyspnea on exertion, peripheral edema, balance deficits, hemoptysis, abdominal pain, melena, hematochezia, severe indigestion/heartburn, hematuria, incontinence, genital sores, muscle weakness, suspicious skin lesions, transient blindness, difficulty walking, depression, unusual weight change, abnormal bleeding, enlarged lymph nodes, angioedema, and breast masses.  Past Medical History: Past Medical History  Diagnosis Date  . Asthma   . COPD (chronic obstructive pulmonary disease) (HCC)   . Coronary artery disease   . Lung mass   . Renal disorder   . Emphysema/COPD (HCC)   . Myocardial infarction (HCC)     in 2000  . Diabetes mellitus without complication (HCC)    History reviewed. No pertinent past surgical history.  Medications: Prior to Admission medications   Medication Sig Start Date End Date Taking? Authorizing Provider  albuterol (PROVENTIL HFA;VENTOLIN HFA) 108 (90 BASE) MCG/ACT inhaler Inhale 2 puffs into the lungs every 6 (six) hours as needed for wheezing or shortness of breath.   Yes Historical Provider, MD  albuterol (PROVENTIL) (2.5 MG/3ML) 0.083% nebulizer  solution Take 2.5 mg by nebulization every 6 (six) hours as needed for wheezing or shortness of breath.   Yes Historical Provider, MD  atorvastatin (LIPITOR) 40 MG tablet Take 40 mg by mouth at bedtime.   Yes Historical Provider, MD  Calcium Carbonate-Vitamin D (CALCIUM 600+D) 600-400 MG-UNIT tablet Take 1 tablet by mouth 2 (two) times daily.   Yes Historical Provider, MD  carvedilol (COREG) 12.5 MG tablet Take 12.5 mg by mouth 2 (two) times daily with a meal.   Yes Historical Provider, MD  cetirizine (ZYRTEC) 10 MG tablet Take 10 mg by mouth daily.   Yes Historical Provider, MD  clonazePAM (KLONOPIN) 0.5 MG tablet Take 0.5 mg by mouth 2 (two) times daily as needed for anxiety.   Yes Historical Provider, MD  elvitegravir-cobicistat-emtricitabine-tenofovir (GENVOYA) 150-150-200-10 MG TABS tablet Take 1 tablet by mouth at bedtime.   Yes Historical Provider, MD  EPINEPHrine (EPIPEN 2-PAK) 0.3 mg/0.3 mL IJ SOAJ injection Inject 0.3 mg into the muscle once as needed (for severe allergic reaction).   Yes Historical Provider, MD  escitalopram (LEXAPRO) 20 MG tablet Take 20 mg by mouth daily.   Yes Historical Provider, MD  fluconazole (DIFLUCAN) 200 MG tablet Take 200 mg by mouth daily.   Yes Historical Provider, MD  fluticasone (FLONASE) 50 MCG/ACT nasal spray Place 1 spray into both nostrils daily.   Yes Historical Provider, MD  gemfibrozil (LOPID) 600 MG tablet Take 600 mg by mouth 2 (two) times daily before a meal.   Yes Historical Provider, MD  isosorbide mononitrate (IMDUR) 30 MG 24 hr tablet Take 30 mg by mouth daily.   Yes Historical Provider, MD  linaclotide (LINZESS) 290 MCG CAPS capsule Take 290 mcg by  mouth daily before breakfast.   Yes Historical Provider, MD  meloxicam (MOBIC) 7.5 MG tablet Take 7.5 mg by mouth 2 (two) times daily.   Yes Historical Provider, MD  mirtazapine (REMERON) 30 MG tablet Take 30 mg by mouth at bedtime.   Yes Historical Provider, MD  mometasone-formoterol (DULERA) 200-5  MCG/ACT AERO Inhale 2 puffs into the lungs 2 (two) times daily.    Yes Historical Provider, MD  Multiple Vitamin (MULTIVITAMIN WITH MINERALS) TABS tablet Take 1 tablet by mouth daily.   Yes Historical Provider, MD  nitroGLYCERIN (NITROSTAT) 0.4 MG SL tablet Place 0.4 mg under the tongue every 5 (five) minutes as needed for chest pain.   Yes Historical Provider, MD  omeprazole (PRILOSEC) 20 MG capsule Take 20 mg by mouth 2 (two) times daily before a meal.   Yes Historical Provider, MD  tiotropium (SPIRIVA) 18 MCG inhalation capsule Place 18 mcg into inhaler and inhale daily.   Yes Historical Provider, MD  traMADol (ULTRAM) 50 MG tablet Take 50 mg by mouth 2 (two) times daily.   Yes Historical Provider, MD  zolpidem (AMBIEN CR) 12.5 MG CR tablet Take 12.5 mg by mouth at bedtime as needed for sleep.   Yes Historical Provider, MD    Allergies:   Allergies  Allergen Reactions  . Aspirin Anaphylaxis  . Bee Venom Anaphylaxis  . Penicillins Anaphylaxis and Other (See Comments)    Has patient had a PCN reaction causing immediate rash, facial/tongue/throat swelling, SOB or lightheadedness with hypotension: Yes Has patient had a PCN reaction causing severe rash involving mucus membranes or skin necrosis: No Has patient had a PCN reaction that required hospitalization No Has patient had a PCN reaction occurring within the last 10 years: Yes If all of the above answers are "NO", then may proceed with Cephalosporin use.  . Prednisone Anaphylaxis  . Sulfa Antibiotics Anaphylaxis  . Sulfasalazine Anaphylaxis  . Theophyllines Anaphylaxis  . Theophylline Swelling    Social History:  reports that he has quit smoking. He has never used smokeless tobacco. He reports that he does not drink alcohol or use illicit drugs.  Family History: Family History  Problem Relation Age of Onset  . CAD      Physical Exam: Filed Vitals:   01/29/16 1900 01/29/16 2000 01/29/16 2141 01/29/16 2200  BP: 133/94 140/87  150/96 167/97  Pulse: 96 94 101 102  Temp:      TempSrc:      Resp: 18 22 21 24   Height:      Weight:      SpO2: 97% 97% 96% 96%    General:  Alert and oriented times three, well developed and nourished, no acute distress Eyes: PERRLA, pink conjunctiva, no scleral icterus ENT: Moist oral mucosa, neck supple, no thyromegaly, facial swelling both cheeks and neck Lungs: clear to ascultation, no wheeze, no crackles, no use of accessory muscles Cardiovascular: regular rate and rhythm, no regurgitation, no gallops, no murmurs. No carotid bruits, no JVD Abdomen: soft, positive BS, non-tender, non-distended, no organomegaly, not an acute abdomen GU: not examined Neuro: CN II - XII grossly intact, sensation intact Musculoskeletal: Left upper and lower extremity strength 2/5, no clubbing, cyanosis or edema Skin: no rash, no subcutaneous crepitation, no decubitus Psych: appropriate patient   Labs on Admission:   Recent Labs  01/29/16 2037  NA 142  K 4.0  CL 110  CO2 22  GLUCOSE 195*  BUN 31*  CREATININE 1.23  CALCIUM 8.4*  Recent Labs  01/29/16 2037  AST 48*  ALT 94*  ALKPHOS 103  BILITOT 0.5  PROT 5.4*  ALBUMIN 2.5*   No results for input(s): LIPASE, AMYLASE in the last 72 hours.  Recent Labs  01/29/16 2112  WBC 7.5  NEUTROABS 6.6*  HGB 10.9*  HCT 31.7*  MCV 99.2  PLT 110*    Recent Labs  01/29/16 2037  TROPONINI 0.06*   Invalid input(s): POCBNP No results for input(s): DDIMER in the last 72 hours. No results for input(s): HGBA1C in the last 72 hours. No results for input(s): CHOL, HDL, LDLCALC, TRIG, CHOLHDL, LDLDIRECT in the last 72 hours. No results for input(s): TSH, T4TOTAL, T3FREE, THYROIDAB in the last 72 hours.  Invalid input(s): FREET3 No results for input(s): VITAMINB12, FOLATE, FERRITIN, TIBC, IRON, RETICCTPCT in the last 72 hours.  Micro Results: No results found for this or any previous visit (from the past 240  hour(s)).   Radiological Exams on Admission: Ct Head Wo Contrast  01/29/2016  CLINICAL DATA:  Increased weakness and several falls today. EXAM: CT HEAD WITHOUT CONTRAST TECHNIQUE: Contiguous axial images were obtained from the base of the skull through the vertex without intravenous contrast. COMPARISON:  MRI brain 01/14/2016 FINDINGS: Ventricles, cisterns and other CSF spaces are within normal. There is mild chronic ischemic microvascular disease. There is no mass, mass effect, shift of midline structures or acute hemorrhage. There is no evidence of acute infarction. Non fusion of the anterior arch of C1. Remaining bony structures are within normal. Evidence of previous sinus surgery with fenestration of the medial wall of the maxillary sinuses. IMPRESSION: No acute intracranial findings. Chronic ischemic microvascular disease. Electronically Signed   By: Elberta Fortis M.D.   On: 01/29/2016 19:30   Ct Cervical Spine Wo Contrast  01/29/2016  CLINICAL DATA:  Increased weakness and several falls today. EXAM: CT HEAD WITHOUT CONTRAST TECHNIQUE: Contiguous axial images were obtained from the base of the skull through the vertex without intravenous contrast. COMPARISON:  MRI brain 01/14/2016 FINDINGS: Ventricles, cisterns and other CSF spaces are within normal. There is mild chronic ischemic microvascular disease. There is no mass, mass effect, shift of midline structures or acute hemorrhage. There is no evidence of acute infarction. Non fusion of the anterior arch of C1. Remaining bony structures are within normal. Evidence of previous sinus surgery with fenestration of the medial wall of the maxillary sinuses. IMPRESSION: No acute intracranial findings. Chronic ischemic microvascular disease. Electronically Signed   By: Elberta Fortis M.D.   On: 01/29/2016 19:30    Assessment/Plan Present on Admission:  Left upper and lower extremity weakness -Admit to MedSurg -PT/OT consult -MRI brain to evaluate for  CVA. Defer to AM team to consult neurology or ID if needed  Hypertension -Stable, resume home medications -When necessary medications  HIV  -Resume home medications  Coronary artery disease -Stable, resume home medications  Elevated troponin -Mild, cycle cardiac enzymes -No complaints of chest pains. Aspirin was started. Patient allergic to aspirin -Lipid panel in a.m.  COPD -Stable, resume home medications    Manette Doto 01/29/2016, 10:46 PM

## 2016-01-29 NOTE — ED Notes (Signed)
Pt provided ginger ale, ok by Dr. Alphonzo LemmingsMcShane. Pt sister in room, given cup of ice, peanut butter, and crackers.

## 2016-01-29 NOTE — ED Notes (Signed)
Pt arrives via EMs from home,  States increased weakness and several falls today, pt was in ED 2 weeks ago for same complaint, arrives in c-collar, denies any LOC but complains of left sided weakness and pain, skin tear on right wrist, brusing on left side noted

## 2016-01-29 NOTE — ED Provider Notes (Addendum)
[facility] Emergency Department Provider Note  ____________________________________________   I have reviewed the triage vital signs and the nursing notes.   HISTORY  Chief Complaint Weakness and Fall    HPI Brian Hampton is a 60 y.o. male with a history of recent admission for falls and weakness, who has extensive medical history including diabetes, HIV,COPD, what he describes as cirrhosis. He states that he has been feeling generally weak since discharge but at this morning at 3:00 in the morning he was having great difficulty moving especially his left upper and to some extent left lower extremity. He does have baseline weakness in both lower legs however this seemed worse. He states he fell several times today. He did bump his head. He did not pass out. Denies headache fever cough chest pain or shortness of breath.  Denies fever or chills. States he has fallen perhaps 3 or 4 times today.   Past Medical History  Diagnosis Date  . Asthma   . COPD (chronic obstructive pulmonary disease) (HCC)   . Coronary artery disease   . Lung mass   . Renal disorder   . Emphysema/COPD (HCC)   . Myocardial infarction (HCC)     in 2000  . Diabetes mellitus without complication [facility](HCC)     Patient Active Problem List   Diagnosis Date Noted  . Leg weakness, bilateral 01/13/2016  . Chest pain 06/24/2015    History reviewed. No pertinent past surgical history.  Current Outpatient Rx  Name  Route  Sig  Dispense  Refill  . albuterol (PROVENTIL HFA;VENTOLIN HFA) 108 (90 BASE) MCG/ACT inhaler   Inhalation   Inhale 2 puffs into the lungs every 6 (six) hours as needed for wheezing or shortness of breath.         Marland Kitchen. albuterol (PROVENTIL) (2.5 MG/3ML) 0.083% nebulizer solution   Nebulization   Take 2.5 mg by nebulization every 6 (six) hours as needed for wheezing or shortness of breath.         Marland Kitchen. atorvastatin (LIPITOR) 40 MG tablet   Oral   Take 40 mg by mouth at  bedtime.         . Calcium Carbonate-Vitamin D (CALCIUM 600+D) 600-400 MG-UNIT tablet   Oral   Take 1 tablet by mouth 2 (two) times daily.         . carvedilol (COREG) 12.5 MG tablet   Oral   Take 12.5 mg by mouth 2 (two) times daily with a meal.         . cetirizine (ZYRTEC) 10 MG tablet   Oral   Take 10 mg by mouth daily.         . clonazePAM (KLONOPIN) 0.5 MG tablet   Oral   Take 0.5 mg by mouth 2 (two) times daily as needed for anxiety.         . elvitegravir-cobicistat-emtricitabine-tenofovir (GENVOYA) 150-150-200-10 MG TABS tablet   Oral   Take 1 tablet by mouth at bedtime.         Marland Kitchen. EPINEPHrine (EPIPEN 2-PAK) 0.3 mg/0.3 mL IJ SOAJ injection   Intramuscular   Inject 0.3 mg into the muscle once as needed (for severe allergic reaction).         Marland Kitchen. escitalopram (LEXAPRO) 20 MG tablet   Oral   Take 20 mg by mouth daily.         . fluconazole (DIFLUCAN) 200 MG tablet   Oral   Take 200 mg by mouth daily.         .Marland Kitchen  fluticasone (FLONASE) 50 MCG/ACT nasal spray   Each Nare   Place 1 spray into both nostrils daily.         Marland Kitchen gemfibrozil (LOPID) 600 MG tablet   Oral   Take 600 mg by mouth 2 (two) times daily before a meal.         . isosorbide mononitrate (IMDUR) 30 MG 24 hr tablet   Oral   Take 30 mg by mouth daily.         Marland Kitchen linaclotide (LINZESS) 290 MCG CAPS capsule   Oral   Take 290 mcg by mouth daily before breakfast.         . meloxicam (MOBIC) 7.5 MG tablet   Oral   Take 7.5 mg by mouth 2 (two) times daily.         . mirtazapine (REMERON) 30 MG tablet   Oral   Take 30 mg by mouth at bedtime.         . mometasone-formoterol (DULERA) 200-5 MCG/ACT AERO   Inhalation   Inhale 2 puffs into the lungs 2 (two) times daily.          . Multiple Vitamin (MULTIVITAMIN WITH MINERALS) TABS tablet   Oral   Take 1 tablet by mouth daily.         . nitroGLYCERIN (NITROSTAT) 0.4 MG SL tablet   Sublingual   Place 0.4 mg under the  tongue every 5 (five) minutes as needed for chest pain.         Marland Kitchen omeprazole (PRILOSEC) 20 MG capsule   Oral   Take 20 mg by mouth 2 (two) times daily before a meal.         . tiotropium (SPIRIVA) 18 MCG inhalation capsule   Inhalation   Place 18 mcg into inhaler and inhale daily.         . traMADol (ULTRAM) 50 MG tablet   Oral   Take 50 mg by mouth 2 (two) times daily.         Marland Kitchen zolpidem (AMBIEN CR) 12.5 MG CR tablet   Oral   Take 12.5 mg by mouth at bedtime as needed for sleep.           Allergies Aspirin; Bee venom; Penicillins; Prednisone; Sulfa antibiotics; Sulfasalazine; and Theophyllines  Family History  Problem Relation Age of Onset  . CAD      Social History Social History  Substance Use Topics  . Smoking status: Former Games developer  . Smokeless tobacco: Never Used  . Alcohol Use: No    Review of Systems Constitutional: No fever/chills Eyes: No visual changes. ENT: No sore throat. No stiff neck no neck pain Cardiovascular: Denies chest pain. Respiratory: Denies shortness of breath. Gastrointestinal:   no vomiting.  No diarrhea.  No constipation. Genitourinary: Negative for dysuria. Musculoskeletal: Negative lower extremity swelling Skin: Negative for rash. Neurological: Negative for headaches, focal weakness or numbness. 10-point ROS otherwise negative.  ____________________________________________   PHYSICAL EXAM:  VITAL SIGNS: ED Triage Vitals  Enc Vitals Group     BP 01/29/16 1832 147/85 mmHg     Pulse Rate 01/29/16 1832 98     Resp 01/29/16 1832 18     Temp 01/29/16 1832 98 F (36.7 C)     Temp Source 01/29/16 1832 Oral     SpO2 01/29/16 1830 96 %     Weight 01/29/16 1832 150 lb (68.04 kg)     Height 01/29/16 1832 5\' 5"  (1.651 m)  Head Cir --      Peak Flow --      Pain Score 01/29/16 1832 10     Pain Loc --      Pain Edu? --      Excl. in GC? --     Constitutional: Alert and oriented. Well appearing and in no acute  distress. Eyes: Conjunctivae are normal. PERRL. EOMI. Head: Atraumatic. Nose: No congestion/rhinnorhea. Mouth/Throat: Mucous membranes are moist.  Oropharynx non-erythematous. Neck: No stridor.   Nontender with no meningismus Cardiovascular: Normal rate, regular rhythm. Grossly normal heart sounds.  Good peripheral circulation. Respiratory: Normal respiratory effort.  No retractions. Lungs CTAB. Abdominal: Soft and nontender.Positive mild distention. No guarding no rebound Back:  There is no focal tenderness or step off there is no midline tenderness there are no lesions noted. there is no CVA tenderness Musculoskeletal: No lower extremity tenderness. No joint effusions, no DVT signs strong distal pulses no edema Neurologic:  Patient has 4 out of 5 strength right upper extremity, 4 out of 5 right lower, he has 3 out of 5 strength left upper and lower extremity was somewhat variable presentation, cranial nerves are grossly intact speech is normal no asterixis no obvious visual field defect Skin:  Skin is warm, dry and intact. Multiple bruises diffusely noted in various stages of healing. Psychiatric: Mood and affect are normal. Speech and behavior are normal.  ____________________________________________   LABS (all labs ordered are listed, but only abnormal results are displayed)  Labs Reviewed  CBC WITH DIFFERENTIAL/PLATELET  PROTIME-INR  COMPREHENSIVE METABOLIC PANEL  ETHANOL  TROPONIN I  URINALYSIS COMPLETEWITH MICROSCOPIC ([facility] ONLY)  AMMONIA   ____________________________________________  EKG  I personally interpreted any EKGs ordered by me or triage Sinus rhythm rate 90 bpm no acute ST elevation or acute ST depression normal axis no specific ST changes ____________________________________________  RADIOLOGY  I reviewed any imaging ordered by me or triage that were performed during my shift and, if possible, patient and/or family made aware of any abnormal  findings. ____________________________________________   PROCEDURES  Procedure(s) performed: None  Critical Care performed: None  ____________________________________________   INITIAL IMPRESSION / ASSESSMENT AND PLAN / ED COURSE  Pertinent labs & imaging results that were available during my care of the patient were reviewed by me and considered in my medical decision making (see chart for details).  Patient was of left upper extremity weakness and left lower extremity weakness on top of a baseline weakness started this morning at 3:00. CT is negative. Patient not a candidate for TPA given time of onset among other considerations including and certainly about underlying pathology given recurrent symptoms of this variety with a negative MRI earlier this month. Lives by himself. His sclerae not safe to go home.  ----------------------------------------- 9:29 PM on 01/29/2016 ----------------------------------------- Patient remains in no acute distress. This time he does appear to be able to move his left arm better than he could before and has some degree of variable exam. Patient has remarkably difficult blood draw. Finally I was able to use a butterfly to get blood out of his shoulder vein with no complications. We're waiting for blood work to see about why the patient has been feeling so generally weak. He likely will need to be admitted as he lives by himself and son-in-law ambulate, and there is also question of a possibly new left upper extremity weakness.  ____________________________________________   FINAL CLINICAL IMPRESSION(S) / ED DIAGNOSES  Final diagnoses:  None  This chart was dictated using voice recognition software.  Despite best efforts to proofread,  errors can occur which can change meaning.     Jeanmarie Plant, MD 01/29/16 1956  Jeanmarie Plant, MD 01/29/16 2132

## 2016-01-30 ENCOUNTER — Inpatient Hospital Stay: Payer: Medicare Other

## 2016-01-30 ENCOUNTER — Inpatient Hospital Stay: Admit: 2016-01-30 | Payer: Medicare Other

## 2016-01-30 ENCOUNTER — Encounter: Payer: Self-pay | Admitting: General Practice

## 2016-01-30 DIAGNOSIS — R531 Weakness: Secondary | ICD-10-CM

## 2016-01-30 LAB — PROTIME-INR
INR: 1.11
PROTHROMBIN TIME: 14.5 s (ref 11.4–15.0)

## 2016-01-30 LAB — TROPONIN I
TROPONIN I: 0.04 ng/mL — AB (ref <0.031)
TROPONIN I: 0.06 ng/mL — AB (ref <0.031)
Troponin I: 0.05 ng/mL — ABNORMAL HIGH (ref <0.031)

## 2016-01-30 LAB — SEDIMENTATION RATE: Sed Rate: 67 mm/hr — ABNORMAL HIGH (ref 0–20)

## 2016-01-30 LAB — CBC
HEMATOCRIT: 32.6 % — AB (ref 40.0–52.0)
HEMOGLOBIN: 10.9 g/dL — AB (ref 13.0–18.0)
MCH: 34 pg (ref 26.0–34.0)
MCHC: 33.3 g/dL (ref 32.0–36.0)
MCV: 101.9 fL — ABNORMAL HIGH (ref 80.0–100.0)
Platelets: 100 10*3/uL — ABNORMAL LOW (ref 150–440)
RBC: 3.2 MIL/uL — AB (ref 4.40–5.90)
RDW: 13.7 % (ref 11.5–14.5)
WBC: 7.1 10*3/uL (ref 3.8–10.6)

## 2016-01-30 LAB — BASIC METABOLIC PANEL
ANION GAP: 7 (ref 5–15)
BUN: 32 mg/dL — ABNORMAL HIGH (ref 6–20)
CALCIUM: 8.5 mg/dL — AB (ref 8.9–10.3)
CO2: 24 mmol/L (ref 22–32)
Chloride: 110 mmol/L (ref 101–111)
Creatinine, Ser: 1.12 mg/dL (ref 0.61–1.24)
GFR calc non Af Amer: >60 mL/min (ref >60)
Glucose, Bld: 186 mg/dL — ABNORMAL HIGH (ref 65–99)
POTASSIUM: 3.9 mmol/L (ref 3.5–5.1)
Sodium: 141 mmol/L (ref 135–145)

## 2016-01-30 LAB — T4, FREE: Free T4: 1.87 ng/dL — ABNORMAL HIGH (ref 0.61–1.12)

## 2016-01-30 LAB — CK: CK TOTAL: 124 U/L (ref 49–397)

## 2016-01-30 LAB — TSH: TSH: 0.037 u[IU]/mL — ABNORMAL LOW (ref 0.350–4.500)

## 2016-01-30 LAB — HEMOGLOBIN A1C: HEMOGLOBIN A1C: 7.2 % — AB (ref 4.0–6.0)

## 2016-01-30 LAB — GLUCOSE, CAPILLARY
Glucose-Capillary: 331 mg/dL — ABNORMAL HIGH (ref 65–99)
Glucose-Capillary: 167 mg/dL — ABNORMAL HIGH (ref 65–99)

## 2016-01-30 LAB — VITAMIN B12: Vitamin B-12: 271 pg/mL (ref 180–914)

## 2016-01-30 MED ORDER — PANTOPRAZOLE SODIUM 40 MG PO TBEC
40.0000 mg | DELAYED_RELEASE_TABLET | Freq: Every day | ORAL | Status: DC
  Administered 2016-01-30 – 2016-02-02 (×4): 40 mg via ORAL
  Filled 2016-01-30 (×4): qty 1

## 2016-01-30 MED ORDER — TIOTROPIUM BROMIDE MONOHYDRATE 18 MCG IN CAPS
18.0000 ug | ORAL_CAPSULE | Freq: Every day | RESPIRATORY_TRACT | Status: DC
  Administered 2016-01-30 – 2016-02-02 (×4): 18 ug via RESPIRATORY_TRACT
  Filled 2016-01-30: qty 5

## 2016-01-30 MED ORDER — INSULIN ASPART 100 UNIT/ML ~~LOC~~ SOLN
0.0000 [IU] | Freq: Three times a day (TID) | SUBCUTANEOUS | Status: DC
Start: 2016-01-30 — End: 2016-02-03
  Administered 2016-01-30: 7 [IU] via SUBCUTANEOUS
  Administered 2016-01-31: 2 [IU] via SUBCUTANEOUS
  Administered 2016-01-31: 7 [IU] via SUBCUTANEOUS
  Administered 2016-01-31: 2 [IU] via SUBCUTANEOUS
  Administered 2016-02-01: 3 [IU] via SUBCUTANEOUS
  Administered 2016-02-01: 9 [IU] via SUBCUTANEOUS
  Administered 2016-02-02: 2 [IU] via SUBCUTANEOUS
  Administered 2016-02-02: 1 [IU] via SUBCUTANEOUS
  Administered 2016-02-02: 3 [IU] via SUBCUTANEOUS
  Filled 2016-01-30: qty 7
  Filled 2016-01-30: qty 1
  Filled 2016-01-30: qty 9
  Filled 2016-01-30 (×2): qty 2
  Filled 2016-01-30: qty 7
  Filled 2016-01-30 (×2): qty 2
  Filled 2016-01-30: qty 3

## 2016-01-30 MED ORDER — MOMETASONE FURO-FORMOTEROL FUM 200-5 MCG/ACT IN AERO
2.0000 | INHALATION_SPRAY | Freq: Two times a day (BID) | RESPIRATORY_TRACT | Status: DC
  Administered 2016-01-30 – 2016-02-02 (×8): 2 via RESPIRATORY_TRACT
  Filled 2016-01-30: qty 8.8

## 2016-01-30 MED ORDER — SENNOSIDES-DOCUSATE SODIUM 8.6-50 MG PO TABS: 1.0000 | ORAL_TABLET | Freq: Every evening | ORAL | Status: DC | PRN

## 2016-01-30 MED ORDER — ZOLPIDEM TARTRATE 5 MG PO TABS
5.0000 mg | ORAL_TABLET | Freq: Every evening | ORAL | Status: DC | PRN
  Administered 2016-01-30 – 2016-02-01 (×3): 5 mg via ORAL
  Filled 2016-01-30 (×3): qty 1

## 2016-01-30 MED ORDER — CARVEDILOL 6.25 MG PO TABS
12.5000 mg | ORAL_TABLET | Freq: Two times a day (BID) | ORAL | Status: DC
  Administered 2016-01-30 – 2016-02-02 (×8): 12.5 mg via ORAL
  Filled 2016-01-30 (×8): qty 2

## 2016-01-30 MED ORDER — METHIMAZOLE 10 MG PO TABS
10.0000 mg | ORAL_TABLET | Freq: Every day | ORAL | Status: DC
  Administered 2016-01-30 – 2016-02-02 (×4): 10 mg via ORAL
  Filled 2016-01-30 (×6): qty 1

## 2016-01-30 MED ORDER — METOPROLOL TARTRATE 5 MG/5ML IV SOLN: 5.0000 mg | INTRAVENOUS | Status: DC | PRN

## 2016-01-30 MED ORDER — NITROGLYCERIN 0.4 MG SL SUBL: 0.4000 mg | SUBLINGUAL_TABLET | SUBLINGUAL | Status: DC | PRN

## 2016-01-30 MED ORDER — FLUCONAZOLE 100 MG PO TABS
200.0000 mg | ORAL_TABLET | Freq: Every day | ORAL | Status: DC
  Administered 2016-01-30 – 2016-02-02 (×4): 200 mg via ORAL
  Filled 2016-01-30 (×4): qty 2

## 2016-01-30 MED ORDER — PROMETHAZINE HCL 25 MG PO TABS: 12.5000 mg | ORAL_TABLET | Freq: Four times a day (QID) | ORAL | Status: DC | PRN

## 2016-01-30 MED ORDER — ISOSORBIDE MONONITRATE ER 30 MG PO TB24
30.0000 mg | ORAL_TABLET | Freq: Every day | ORAL | Status: DC
  Administered 2016-01-30 – 2016-02-02 (×4): 30 mg via ORAL
  Filled 2016-01-30 (×4): qty 1

## 2016-01-30 MED ORDER — CLONAZEPAM 0.5 MG PO TABS
0.5000 mg | ORAL_TABLET | Freq: Two times a day (BID) | ORAL | Status: DC | PRN
  Administered 2016-01-30: 0.5 mg via ORAL
  Filled 2016-01-30: qty 1

## 2016-01-30 MED ORDER — ENOXAPARIN SODIUM 40 MG/0.4ML ~~LOC~~ SOLN
40.0000 mg | Freq: Every day | SUBCUTANEOUS | Status: DC
  Administered 2016-01-30 – 2016-02-02 (×5): 40 mg via SUBCUTANEOUS
  Filled 2016-01-30 (×5): qty 0.4

## 2016-01-30 MED ORDER — TRAMADOL HCL 50 MG PO TABS
50.0000 mg | ORAL_TABLET | Freq: Two times a day (BID) | ORAL | Status: DC
  Administered 2016-01-30 – 2016-02-02 (×9): 50 mg via ORAL
  Filled 2016-01-30 (×9): qty 1

## 2016-01-30 MED ORDER — MIRTAZAPINE 15 MG PO TABS
30.0000 mg | ORAL_TABLET | Freq: Every day | ORAL | Status: DC
  Administered 2016-01-30 – 2016-02-02 (×5): 30 mg via ORAL
  Filled 2016-01-30 (×5): qty 2

## 2016-01-30 MED ORDER — ESCITALOPRAM OXALATE 10 MG PO TABS
20.0000 mg | ORAL_TABLET | Freq: Every day | ORAL | Status: DC
  Administered 2016-01-30 – 2016-02-02 (×4): 20 mg via ORAL
  Filled 2016-01-30 (×4): qty 2

## 2016-01-30 MED ORDER — ACETAMINOPHEN 325 MG PO TABS
650.0000 mg | ORAL_TABLET | Freq: Four times a day (QID) | ORAL | Status: DC | PRN
  Administered 2016-02-01 – 2016-02-02 (×2): 650 mg via ORAL
  Filled 2016-01-30 (×2): qty 2

## 2016-01-30 MED ORDER — ABACAVIR-DOLUTEGRAVIR-LAMIVUD 600-50-300 MG PO TABS
1.0000 | ORAL_TABLET | Freq: Every day | ORAL | Status: DC
  Administered 2016-01-30 – 2016-02-02 (×5): 1 via ORAL
  Filled 2016-01-30 (×7): qty 1

## 2016-01-30 MED ORDER — ACETAMINOPHEN 650 MG RE SUPP: 650.0000 mg | Freq: Four times a day (QID) | RECTAL | Status: DC | PRN

## 2016-01-30 NOTE — Consult Note (Signed)
ENDOCRINOLOGY CONSULTATION  REFERRING PHYSICIAN: [name], MD CONSULTING PHYSICIAN:  A. Wendall MolaMelissa Solum, MD  Chief complaint Low TSH  History of present illness Brian Hampton is a 60 y.o. with history of COPD and HIV+ admitted early today with severe lower extremity and left upper extremity weakness. Initial onset of weakness was in mid May 2017 with gradual worsening. He was hospitalized for this same complaint from 6/6 - 01/16/16. During that visit, was evaluated by ID and Neurology, had imaging and labs including TSH level found to be low at 0.092 uIU/ml and free T4 elevated at 1.25 ng/dl. 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 He presented again today with c/o 4 falls yesterday and worsened weakness.   Prior to last admission, had been on an inhaled steroid (dulera) and taking the antiviral Genvoya, which can enhance the effects of inhaled steroids. [name] was started in 07/2015. Over the last 4 weeks patient report facial swelling and abdominal distension. He has had 5 lb weight gain over 6 months. Two days ago at a follow up visit with Dr Sampson GoonFitzgerald, ID, [name] was stopped and replaced with Triumeq.   Repeat labs today notable for again low TSH level of 0.037 uIU/ml. Additionally he has mild AST/ALT elevation and mild anemia.   He denies a know prior history of thyroid disease. He denies neck pain or fever. No known use of amiodarone, lithium, glucocorticoids. No recent exposure to iodinated contrast dye - [facility] for a CT scan in 06/2015. He has experienced heart racing. No palpitations. No tremor. No anxietyor heat intolerance. No unintentional weight loss. No family h/o thyroid disease.   Medical history Past Medical History  Diagnosis Date  . Asthma   . COPD (chronic obstructive pulmonary disease) (HCC)   . Coronary artery disease   . Lung mass   . Anemia   . Emphysema/COPD (HCC)   .  Myocardial infarction (HCC)     in 2000  . Type 2 diabetes mellitus (HCC)   . HIV (human immunodeficiency virus infection) (HCC)     Surgical history History reviewed. No pertinent past surgical history.  Medications . abacavir-dolutegravir-lamiVUDine  1 tablet Oral QHS  . carvedilol  12.5 mg Oral BID WC  . enoxaparin (LOVENOX) injection  40 mg Subcutaneous QHS  . escitalopram  20 mg Oral Daily  . fluconazole  200 mg Oral Daily  . insulin aspart  0-9 Units Subcutaneous TID WC  . isosorbide mononitrate  30 mg Oral Daily  .      . mirtazapine  30 mg Oral QHS  . mometasone-formoterol  2 puff Inhalation BID  . pantoprazole  40 mg Oral QAC breakfast  . tiotropium  18 mcg Inhalation Daily  . traMADol  50 mg Oral BID    Social History  Substance Use Topics  . Smoking status: Former Games developermoker  . Smokeless tobacco: Never Used  . Alcohol Use: No    Family History  Problem Relation Age of Onset  . CAD      Review of systems GENERAL:  Patient has fatigue. Patient denies fever. NEUROLOGIC:  Patient denies headaches. EYES:  Patient denies blurred vision. NECK:  Patient has noted gradual onset neck swelling. No neck pain. No difficulty with swallowing. CARDIAC:  Patient denies chest pain or palpitations. RESPIRATORY:  Patient denies SOB.  Patient denies a cough.   GASTROINTESTINAL:  Patient denies abdominal pain, nausea and vomiting and  constipation. Patient denies blood in the stool. GENITOURINARY:  Patient denies dysuria and hematuria.  EXTREMITIES:  Patient denies leg swelling. HEME:  Patient denies easy bruisability. SKIN:  Patient denies any rash.   Exam BP 144/87 mmHg  Pulse 92  Temp(Src) 97.6 F (36.4 C) (Oral)  Resp 20  Ht 5\' 5"  (1.651 m)  Wt 68.04 kg (150 lb)  BMI 24.96 kg/m2  SpO2 97% GEN: White male, resting, in NAD HEENT: +moon facies. +ruddy complexion. OP clear.  NECK: supple, trachea midline, thyroid not enlarged and no palpable nodules. No neck  tenderness. RESPIRATORY: clear bilaterally, no wheeze, good inspiratory effort. CV: No carotid bruits, RRR. MUSCULOSKELETAL: no clubbing, no tremor of outstretched hands. ABD: +distended. Soft, NT. EXT: no edema SKIN: No striae on abdomen. +Multiple ecchymoses on arms. No dermatopathy or rash or acanthosis nigricans.  LYMPH: no submandibular or supraclavicular LAD NEURO: PERRL, 2+ knee DTRs equal and symmetric PSYC: alert and oriented, good insight  Laboratory/Radiology Lab Results  Component Value Date   TSH 0.037* 01/30/2016     Assessment 1. Low TSH and high T4 consistent with hyperthyroidism. Causes may include silent thyroiditis, Grave's Disease, toxic MNG or toxic thyroid adenoma 2. 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 3. COPD 4. HIV+  Plan * We discussed hyperthyroidism, its signs, symptoms, causes, natural history and treatment.  * Will start methimazole now. Will obtain thyroid antibodies to assess for Grave's Disease. * Will plan to obtain repeat TSH and free T4 in 3 days. Will stop methimazole if thyroid labs normalized and thyroid antibodies neg, which would suggest thyroiditis.  * If diagnosis not clear with lab evaluation, could consider thyroid uptake/scan as out-patient.  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 Labs for cortisol at this time will not be helpful as the [name] has been stopped. Would expect gradual improvement in upcoming weeks if this is steroid-induced myopathy.  Thank you for the kind request for consultation. Will follow along with you.  Case discussed with Dr Sampson GoonFitzgerald, ID. Time spent on consult including coordinating care was 62 min.

## 2016-01-30 NOTE — [place] FL2 (Signed)
[place] MEDICAID FL2 LEVEL OF CARE SCREENING TOOL     IDENTIFICATION  Patient Name: Brian Hampton Birthdate: 05-01-1956 Sex: male Admission Date (Current Location): 01/29/2016  [facility] [place]  [place]  ([id number]) Facility and Address:  [facility], [address], [place], [place] [address]      Provider Number: [id number]  Attending Physician Name and Address:  [name], MD  Relative Name and Phone Number:       Current Level of Care: Hospital Recommended Level of Care: Skilled Nursing Facility Prior Approval Number:    Date Approved/Denied:   PASRR Number:  [id number] [id number])  Discharge Plan: SNF    Current Diagnoses: Patient Active Problem List   Diagnosis Date Noted  . Weakness 01/29/2016  . HIV (human immunodeficiency virus infection) (HCC) 01/29/2016  . CAD (coronary artery disease) 01/29/2016  . COPD (chronic obstructive pulmonary disease) (HCC) 01/29/2016  . Diabetes mellitus (HCC) 01/29/2016  . Leg weakness, bilateral 01/13/2016  . Chest pain 06/24/2015    Orientation RESPIRATION BLADDER Height & Weight     Self, Situation, Place  Normal Continent Weight: 150 lb (68.04 kg) Height:  5\' 5"  (165.1 cm)  BEHAVIORAL SYMPTOMS/MOOD NEUROLOGICAL BOWEL NUTRITION STATUS   (none )  (none ) Continent Diet (Diet: Heart Healthy )  AMBULATORY STATUS COMMUNICATION OF NEEDS Skin   Extensive Assist Verbally Normal                       Personal Care Assistance Level of Assistance  Bathing, Feeding, Dressing Bathing Assistance: Limited assistance Feeding assistance: Independent Dressing Assistance: Limited assistance     Functional Limitations Info  Sight, Hearing, Speech Sight Info: Adequate Hearing Info: Adequate Speech Info: Adequate    SPECIAL CARE FACTORS FREQUENCY  PT (By licensed PT), OT (By licensed OT)     PT Frequency:  (5) OT Frequency:  (5)            Contractures      Additional Factors  Info  Code Status, Allergies Code Status Info:  (DNR ) Allergies Info:  (Aspirin, Bee Venom, Penicillins, Prednisone, Sulfa Antibiotics, Sulfasalazine, Theophyllines, Theophylline)           Current Medications (01/30/2016):  This is the current hospital active medication list Current Facility-Administered Medications  Medication Dose Route Frequency Provider Last Rate Last Dose  . abacavir-dolutegravir-lamiVUDine (TRIUMEQ) 600-50-300 MG per tablet 1 tablet  1 tablet Oral QHS Gery Prayebby Crosley, MD   1 tablet at 01/30/16 0145  . acetaminophen (TYLENOL) tablet 650 mg  650 mg Oral Q6H PRN Debby Crosley, MD       Or  . acetaminophen (TYLENOL) suppository 650 mg  650 mg Rectal Q6H PRN Debby Crosley, MD      . carvedilol (COREG) tablet 12.5 mg  12.5 mg Oral BID WC Debby Crosley, MD   12.5 mg at 01/30/16 0842  . clonazePAM (KLONOPIN) tablet 0.5 mg  0.5 mg Oral BID PRN Gery Prayebby Crosley, MD   0.5 mg at 01/30/16 0144  . enoxaparin (LOVENOX) injection 40 mg  40 mg Subcutaneous QHS Debby Crosley, MD   40 mg at 01/30/16 0144  . escitalopram (LEXAPRO) tablet 20 mg  20 mg Oral Daily Debby Crosley, MD   20 mg at 01/30/16 0843  . fluconazole (DIFLUCAN) tablet 200 mg  200 mg Oral Daily Debby Crosley, MD   200 mg at 01/30/16 0844  . insulin aspart (novoLOG) injection 0-9 Units  0-9 Units Subcutaneous TID WC  [name], MD      . isosorbide mononitrate (IMDUR) 24 hr tablet 30 mg  30 mg Oral Daily Debby Crosley, MD   30 mg at 01/30/16 0844  . metoprolol (LOPRESSOR) injection 5 mg  5 mg Intravenous Q4H PRN Debby Crosley, MD      . mirtazapine (REMERON) tablet 30 mg  30 mg Oral QHS Debby Crosley, MD   30 mg at 01/30/16 0144  . mometasone-formoterol (DULERA) 200-5 MCG/ACT inhaler 2 puff  2 puff Inhalation BID Gery Prayebby Crosley, MD   2 puff at 01/30/16 0841  . nitroGLYCERIN (NITROSTAT) SL tablet 0.4 mg  0.4 mg Sublingual Q5 min PRN Debby Crosley, MD      . pantoprazole (PROTONIX) EC tablet 40 mg  40 mg Oral QAC breakfast  Debby Crosley, MD   40 mg at 01/30/16 0842  . promethazine (PHENERGAN) tablet 12.5 mg  12.5 mg Oral Q6H PRN Debby Crosley, MD      . senna-docusate (Senokot-S) tablet 1 tablet  1 tablet Oral QHS PRN Debby Crosley, MD      . tiotropium (SPIRIVA) inhalation capsule 18 mcg  18 mcg Inhalation Daily Debby Crosley, MD   18 mcg at 01/30/16 0840  . traMADol (ULTRAM) tablet 50 mg  50 mg Oral BID Gery Prayebby Crosley, MD   50 mg at 01/30/16 0845  . zolpidem (AMBIEN) tablet 5 mg  5 mg Oral QHS PRN Gery Prayebby Crosley, MD   5 mg at 01/30/16 0144     Discharge Medications: Please see discharge summary for a list of discharge medications.  Relevant Imaging Results:  Relevant Lab Results:   Additional Information  (SSN: [id number])  Haig ProphetMorgan, Vernella Niznik G, LCSW

## 2016-01-30 NOTE — Consult Note (Signed)
Reason for Consult:Left sided numbness and weakness Referring Physician: Sudini  CC: Left sided numbness and weakness  HPI: Brian Hampton is an 60 y.o. male with known history of HIV, recently admitted with weakness, workup was unrevealing. Patient was discharged with instructions to do outpatient EMG. Today he became acutely week ago at 3 AM. His weakness was left-sided both upper and lower extremities. Reports numbness and pain on the left side as well.  He fell 4 times today. He states his lower extremity is numb. Denies any headache and tinnitus and blurred vision, altered mentation,'s slurred speech, facial droop.   Past Medical History  Diagnosis Date  . Asthma   . COPD (chronic obstructive pulmonary disease) ([place])   . Coronary artery disease   . Lung mass   . Renal disorder   . Emphysema/COPD ([place])   . Myocardial infarction ([place])     in 2000  . Diabetes mellitus without complication ([place])     History reviewed. No pertinent past surgical history.  Family History  Problem Relation Age of Onset  . CAD      Social History:  reports that he has quit smoking. He has never used smokeless tobacco. He reports that he does not drink alcohol or use illicit drugs.  Allergies  Allergen Reactions  . Aspirin Anaphylaxis  . Bee Venom Anaphylaxis  . Penicillins Anaphylaxis and Other (See Comments)    Has patient had a PCN reaction causing immediate rash, facial/tongue/throat swelling, SOB or lightheadedness with hypotension: Yes Has patient had a PCN reaction causing severe rash involving mucus membranes or skin necrosis: No Has patient had a PCN reaction that required hospitalization No Has patient had a PCN reaction occurring within the last 10 years: Yes If all of the above answers are "NO", then may proceed with Cephalosporin use.  . Prednisone Anaphylaxis  . Sulfa Antibiotics Anaphylaxis  . Sulfasalazine Anaphylaxis  . Theophyllines Anaphylaxis  . Theophylline Swelling     Medications:  I have reviewed the patient's current medications. Prior to Admission:  Prescriptions prior to admission  Medication Sig Dispense Refill Last Dose  . albuterol (PROVENTIL HFA;VENTOLIN HFA) 108 (90 BASE) MCG/ACT inhaler Inhale 2 puffs into the lungs every 6 (six) hours as needed for wheezing or shortness of breath.   prn at prn  . albuterol (PROVENTIL) (2.5 MG/3ML) 0.083% nebulizer solution Take 2.5 mg by nebulization every 6 (six) hours as needed for wheezing or shortness of breath.   prn at prn  . atorvastatin (LIPITOR) 40 MG tablet Take 40 mg by mouth at bedtime.   01/28/2016 at Unknown time  . Calcium Carbonate-Vitamin D (CALCIUM 600+D) 600-400 MG-UNIT tablet Take 1 tablet by mouth 2 (two) times daily.   01/29/2016 at Unknown time  . carvedilol (COREG) 12.5 MG tablet Take 12.5 mg by mouth 2 (two) times daily with a meal.   01/29/2016 at Unknown time  . cetirizine (ZYRTEC) 10 MG tablet Take 10 mg by mouth daily.   01/29/2016 at Unknown time  . clonazePAM (KLONOPIN) 0.5 MG tablet Take 0.5 mg by mouth 2 (two) times daily as needed for anxiety.   01/29/2016 at Unknown time  . elvitegravir-cobicistat-emtricitabine-tenofovir (GENVOYA) 150-150-200-10 MG TABS tablet Take 1 tablet by mouth at bedtime.   01/29/2016 at Unknown time  . EPINEPHrine (EPIPEN 2-PAK) 0.3 mg/0.3 mL IJ SOAJ injection Inject 0.3 mg into the muscle once as needed (for severe allergic reaction).   prn at prn  . escitalopram (LEXAPRO) 20 MG tablet Take  20 mg by mouth daily.   01/29/2016 at Unknown time  . fluconazole (DIFLUCAN) 200 MG tablet Take 200 mg by mouth daily.   01/29/2016 at Unknown time  . fluticasone (FLONASE) 50 MCG/ACT nasal spray Place 1 spray into both nostrils daily.   01/29/2016 at Unknown time  . gemfibrozil (LOPID) 600 MG tablet Take 600 mg by mouth 2 (two) times daily before a meal.   01/29/2016 at Unknown time  . isosorbide mononitrate (IMDUR) 30 MG 24 hr tablet Take 30 mg by mouth daily.   01/29/2016 at  Unknown time  . linaclotide (LINZESS) 290 MCG CAPS capsule Take 290 mcg by mouth daily before breakfast.   01/29/2016 at Unknown time  . meloxicam (MOBIC) 7.5 MG tablet Take 7.5 mg by mouth 2 (two) times daily.   01/29/2016 at Unknown time  . mirtazapine (REMERON) 30 MG tablet Take 30 mg by mouth at bedtime.   01/28/2016 at Unknown time  . mometasone-formoterol (DULERA) 200-5 MCG/ACT AERO Inhale 2 puffs into the lungs 2 (two) times daily.    01/29/2016 at Unknown time  . Multiple Vitamin (MULTIVITAMIN WITH MINERALS) TABS tablet Take 1 tablet by mouth daily.   01/29/2016 at Unknown time  . nitroGLYCERIN (NITROSTAT) 0.4 MG SL tablet Place 0.4 mg under the tongue every 5 (five) minutes as needed for chest pain.   prn at prn  . omeprazole (PRILOSEC) 20 MG capsule Take 20 mg by mouth 2 (two) times daily before a meal.   01/29/2016 at Unknown time  . tiotropium (SPIRIVA) 18 MCG inhalation capsule Place 18 mcg into inhaler and inhale daily.   01/29/2016 at Unknown time  . traMADol (ULTRAM) 50 MG tablet Take 50 mg by mouth 2 (two) times daily.   01/29/2016 at Unknown time  . zolpidem (AMBIEN CR) 12.5 MG CR tablet Take 12.5 mg by mouth at bedtime as needed for sleep.   01/28/2016 at Unknown time  . abacavir-dolutegravir-lamiVUDine (TRIUMEQ) 600-50-300 MG tablet Take 1 tablet by mouth daily. Reported on 01/30/2016   Not Taking at Unknown time   Scheduled: . abacavir-dolutegravir-lamiVUDine  1 tablet Oral QHS  . carvedilol  12.5 mg Oral BID WC  . enoxaparin (LOVENOX) injection  40 mg Subcutaneous QHS  . escitalopram  20 mg Oral Daily  . fluconazole  200 mg Oral Daily  . insulin aspart  0-9 Units Subcutaneous TID WC  . isosorbide mononitrate  30 mg Oral Daily  . mirtazapine  30 mg Oral QHS  . mometasone-formoterol  2 puff Inhalation BID  . pantoprazole  40 mg Oral QAC breakfast  . tiotropium  18 mcg Inhalation Daily  . traMADol  50 mg Oral BID    ROS: History obtained from the patient  General ROS: negative  for - chills, fatigue, fever, night sweats, weight gain or weight loss Psychological ROS: negative for - behavioral disorder, hallucinations, memory difficulties, mood swings or suicidal ideation Ophthalmic ROS: negative for - blurry vision, double vision, eye pain or loss of vision ENT ROS: negative for - epistaxis, nasal discharge, oral lesions, sore throat, tinnitus or vertigo Allergy and Immunology ROS: negative for - hives or itchy/watery eyes Hematological and Lymphatic ROS: negative for - bleeding problems, bruising or swollen lymph nodes Endocrine ROS: negative for - galactorrhea, hair pattern changes, polydipsia/polyuria or temperature intolerance Respiratory ROS: negative for - cough, hemoptysis, shortness of breath or wheezing Cardiovascular ROS: negative for - chest pain, dyspnea on exertion, edema or irregular heartbeat Gastrointestinal ROS: negative for - abdominal  pain, diarrhea, hematemesis, nausea/vomiting or stool incontinence Genito-Urinary ROS: negative for - dysuria, hematuria, incontinence or urinary frequency/urgency Musculoskeletal ROS: as noted in HPI Neurological ROS: as noted in HPI Dermatological ROS: negative for rash and skin lesion changes  Physical Examination: Blood pressure 144/87, pulse 92, temperature 97.6 F (36.4 C), temperature source Oral, resp. rate 20, height _0  (1.651 m), weight 68.04 kg (150 lb), SpO2 97 %.  HEENT-  Normocephalic, no lesions, without obvious abnormality.  Normal external eye and conjunctiva.  Normal TM's bilaterally.  Normal auditory canals and external ears. Normal external nose, mucus membranes and septum.  Normal pharynx. Cardiovascular- S1, S2 normal, pulses palpable throughout   Lungs- chest clear, no wheezing, rales, normal symmetric air entry Abdomen- soft, non-tender; bowel sounds normal; no masses,  no organomegaly Extremities- BLE edema, right greater than left Lymph-no adenopathy palpable Musculoskeletal-pain on  palpation of the left upper and lower extremity.  Decreased ROM on the right Skin-bruises on the knees bilaterally  Neurological Examination Mental Status: Alert, oriented, thought content appropriate.  Speech fluent without evidence of aphasia.  Able to follow 3 step commands without difficulty. Cranial Nerves: II: Discs flat bilaterally; Visual fields grossly normal, pupils equal, round, reactive to light and accommodation III,IV, VI: ptosis not present, extra-ocular motions intact bilaterally V,VII: smile symmetric, facial light touch sensation reported decreased bilaterally VIII: hearing normal bilaterally IX,X: gag reflex present XI: bilateral shoulder shrug XII: midline tongue extension Motor: Right : Upper extremity   5/5    Left:     Upper extremity   5-/5 with no pronator drift.  Hand grip symmetric  Lower extremity   5-/5     Lower extremity   4/5 with decreased effort and no reciprocal movement down of the RLE with attempts to lift the LLE.   Tone and bulk:normal tone throughout; no atrophy noted Sensory: Pinprick and light touch decreased in the LLE Deep Tendon Reflexes: 2+ and symmetric with absent AJ's bilaterally Plantars: Right: downgoing   Left: downgoing Cerebellar: Normal finger-to-nose testing bilaterally with tremor Gait: not tested due to safety concerns      Laboratory Studies:   Basic Metabolic Panel:  Recent Labs Lab 01/29/16 2037 01/30/16 0118  NA 142 141  K 4.0 3.9  CL 110 110  CO2 22 24  GLUCOSE 195* 186*  BUN 31* 32*  CREATININE 1.23 1.12  CALCIUM 8.4* 8.5*    Liver Function Tests:  Recent Labs Lab 01/29/16 2037  AST 48*  ALT 94*  ALKPHOS 103  BILITOT 0.5  PROT 5.4*  ALBUMIN 2.5*   No results for input(s): LIPASE, AMYLASE in the last 168 hours.  Recent Labs Lab 01/29/16 2112  AMMONIA 10    CBC:  Recent Labs Lab 01/29/16 2112 01/30/16 0118  WBC 7.5 7.1  NEUTROABS 6.6*  --   HGB 10.9* 10.9*  HCT 31.7* 32.6*  MCV  99.2 101.9*  PLT 110* 100*    Cardiac Enzymes:  Recent Labs Lab 01/29/16 2037 01/30/16 0118 01/30/16 0703 01/30/16 1242  TROPONINI 0.06* 0.05* 0.04* 0.06*    BNP: Invalid input(s): POCBNP  CBG:  Recent Labs Lab 01/29/16 2059  GLUCAP 48*    Microbiology: Results for orders placed or performed during the hospital encounter of 01/13/16  CSF culture     Status: None   Collection Time: 01/15/16 12:19 PM  Result Value Ref Range Status   Specimen Description CSF  Final   Special Requests Immunocompromised  Final   Gram Stain NO  ORGANISMS SEEN NO WBC SEEN CYTOSPIN SMEAR   Final   Culture   Final    NO GROWTH 3 DAYS Performed at [facility]    Report Status 01/19/2016 FINAL  Final  Culture, fungus without smear ([facility]-Only)     Status: None (Preliminary result)   Collection Time: 01/15/16 12:19 PM  Result Value Ref Range Status   Specimen Description CSF  Final   Special Requests Immunocompromised  Final   Culture   Final    NO FUNGUS ISOLATED AFTER 14 DAYS Performed at [facility]    Report Status PENDING  Incomplete    Coagulation Studies:  Recent Labs  01/30/16 0118  LABPROT 14.5  INR 1.11    Urinalysis:  Recent Labs Lab 01/29/16 2247  COLORURINE YELLOW*  LABSPEC 1.016  PHURINE 5.0  GLUCOSEU >500*  HGBUR 2+*  BILIRUBINUR NEGATIVE  KETONESUR NEGATIVE  PROTEINUR 100*  NITRITE NEGATIVE  LEUKOCYTESUR 1+*    Lipid Panel:     Component Value Date/Time   CHOL 161 06/25/2015 0426   TRIG 96 06/25/2015 0426   HDL 42 06/25/2015 0426   CHOLHDL 3.8 06/25/2015 0426   VLDL 19 06/25/2015 0426   LDLCALC 100* 06/25/2015 0426    HgbA1C:  Lab Results  Component Value Date   HGBA1C 6.2* 01/15/2016    Urine Drug Screen:  No results found for: LABOPIA, COCAINSCRNUR, LABBENZ, AMPHETMU, THCU, LABBARB  Alcohol Level:  Recent Labs Lab 01/29/16 2037  [place] <5    Other results: EKG: sinus rhythm at 98 bpm.  Imaging: Ct Head Wo  Contrast  01/29/2016  CLINICAL DATA:  Increased weakness and several falls today. EXAM: CT HEAD WITHOUT CONTRAST TECHNIQUE: Contiguous axial images were obtained from the base of the skull through the vertex without intravenous contrast. COMPARISON:  MRI brain 01/14/2016 FINDINGS: Ventricles, cisterns and other CSF spaces are within normal. There is mild chronic ischemic microvascular disease. There is no mass, mass effect, shift of midline structures or acute hemorrhage. There is no evidence of acute infarction. Non fusion of the anterior arch of C1. Remaining bony structures are within normal. Evidence of previous sinus surgery with fenestration of the medial wall of the maxillary sinuses. IMPRESSION: No acute intracranial findings. Chronic ischemic microvascular disease. Electronically Signed   By: Marin Olp M.D.   On: 01/29/2016 19:30   Ct Cervical Spine Wo Contrast  01/29/2016  CLINICAL DATA:  Increased weakness and several falls today. EXAM: CT HEAD WITHOUT CONTRAST TECHNIQUE: Contiguous axial images were obtained from the base of the skull through the vertex without intravenous contrast. COMPARISON:  MRI brain 01/14/2016 FINDINGS: Ventricles, cisterns and other CSF spaces are within normal. There is mild chronic ischemic microvascular disease. There is no mass, mass effect, shift of midline structures or acute hemorrhage. There is no evidence of acute infarction. Non fusion of the anterior arch of C1. Remaining bony structures are within normal. Evidence of previous sinus surgery with fenestration of the medial wall of the maxillary sinuses. IMPRESSION: No acute intracranial findings. Chronic ischemic microvascular disease. Electronically Signed   By: Marin Olp M.D.   On: 01/29/2016 19:30   Mr Brain Wo Contrast  01/30/2016  CLINICAL DATA:  Weakness.  HIV positive.  Altered mental status. EXAM: MRI HEAD WITHOUT CONTRAST TECHNIQUE: Multiplanar, multiecho pulse sequences of the brain and  surrounding structures were obtained without intravenous contrast. COMPARISON:  CT head 01/29/2016 FINDINGS: Image quality degraded by motion Mild atrophy and mild ventricular enlargement unchanged from  prior studies. Negative for acute infarct. Chronic white matter changes are stable since the prior MRI of 01/14/2016. Mild hyperintensity in the pons left greater than right also unchanged Negative for mass or edema Negative for hemorrhage or fluid collection. Paranasal sinuses clear. Prior sinus surgery. Normal orbit and pituitary. IMPRESSION: Atrophy and chronic white matter changes are stable. No superimposed acute abnormality. Electronically Signed   By: Franchot Gallo M.D.   On: 01/30/2016 11:38     Assessment/Plan: 60 year old male presenting with weakness, worse on the left.  MRI of the brain personally reviewed and shows no acute changes.  Work up on last visit only revealed a moderately elevated ESR, low TSH and low B12.  Patient not currently on B12 supplementation.  Would revisit these tests since any of these may be contributing to his weakness.  Patient will also need to keep his appointment for a NCV/EMG.  Recommendations: 1.  ESR, B12, methylmalonic acid.  TSH remains low.  T4 in progress 2.  PT consult   Alexis Goodell, MD Neurology [phone] 01/30/2016, 3:27 PM

## 2016-01-30 NOTE — Progress Notes (Signed)
[facility] [place] [facility]   PATIENT NAME: Brian Hampton    MRN#:  [id number]  DATE OF BIRTH:  02-May-1956  SUBJECTIVE:  Hospital Day: 1 day Brian Hampton is a 60 y.o. male presenting with Weakness and Fall .   Continues to have weakness allover. Left >> right Lower extremities >> upper extremities Also has pain left side and RLE  REVIEW OF SYSTEMS:  CONSTITUTIONAL: No fever,Positive fatigue or weakness.  EYES: No blurred or double vision.  EARS, NOSE, AND THROAT: No tinnitus or ear pain.  RESPIRATORY: No cough, shortness of breath, wheezing or hemoptysis.  CARDIOVASCULAR: No chest pain, orthopnea, edema.  GASTROINTESTINAL: No nausea, vomiting, diarrhea or abdominal pain.  GENITOURINARY: No dysuria, hematuria.  ENDOCRINE: No polyuria, nocturia,  HEMATOLOGY: No anemia, easy bruising or bleeding SKIN: No rash or lesion. MUSCULOSKELETAL: No joint pain or arthritis.   NEUROLOGIC: No tingling, numbness, positive weakness.  PSYCHIATRY: No anxiety or depression.   DRUG ALLERGIES:   Allergies  Allergen Reactions  . Aspirin Anaphylaxis  . Bee Venom Anaphylaxis  . Penicillins Anaphylaxis and Other (See Comments)    Has patient had a PCN reaction causing immediate rash, facial/tongue/throat swelling, SOB or lightheadedness with hypotension: Yes Has patient had a PCN reaction causing severe rash involving mucus membranes or skin necrosis: No Has patient had a PCN reaction that required hospitalization No Has patient had a PCN reaction occurring within the last 10 years: Yes If all of the above answers are "NO", then may proceed with Cephalosporin use.  . Prednisone Anaphylaxis  . Sulfa Antibiotics Anaphylaxis  . Sulfasalazine Anaphylaxis  . Theophyllines Anaphylaxis  . Theophylline Swelling    VITALS:  Blood pressure 155/93, pulse 92, temperature 98.2 F (36.8 C), temperature source Oral, resp. rate 22, height 5\' 5"  (1.651 m), weight 68.04 kg (150  lb), SpO2 97 %.  PHYSICAL EXAMINATION:  VITAL SIGNS: Filed Vitals:   01/30/16 0418 01/30/16 0726  BP: 157/92 155/93  Pulse: 101 92  Temp: 98.5 F (36.9 C) 98.2 F (36.8 C)  Resp: 16 22   GENERAL:59 y.o.male currently in no acute distress.  HEAD: Normocephalic, atraumatic.  EYES: Pupils equal, round, reactive to light. Extraocular muscles intact. No scleral icterus.  MOUTH: Moist mucosal membrane. Dentition intact. No abscess noted.  EAR, NOSE, THROAT: Clear without exudates. No external lesions.  NECK: Supple. No thyromegaly. No nodules. No JVD.  PULMONARY: Clear to ascultation, without wheeze rails or rhonci. No use of accessory muscles, Good respiratory effort. good air entry bilaterally CHEST: Nontender to palpation.  CARDIOVASCULAR: S1 and S2. Regular rate and rhythm. No murmurs, rubs, or gallops. No edema. Pedal pulses 2+ bilaterally.  GASTROINTESTINAL: Soft, nontender, nondistended. No masses. Positive bowel sounds. No hepatosplenomegaly.  MUSCULOSKELETAL: No swelling, clubbing, or edema. Range of motion full in all extremities.  NEUROLOGIC: Cranial nerves II through XII are intact. Motor - LUE 5-/5 RUE 5/5 LLE 4-/8145m RLE 5-/5   Sensation decreased left. SKIN: No ulceration, lesions, rashes, or cyanosis. Skin warm and dry. Turgor intact.  PSYCHIATRIC: Mood, affect within normal limits. The patient is awake, alert and oriented x 3. Insight, judgment intact.      LABORATORY PANEL:   CBC  Recent Labs Lab 01/30/16 0118  WBC 7.1  HGB 10.9*  HCT 32.6*  PLT 100*   ------------------------------------------------------------------------------------------------------------------  Chemistries   Recent Labs Lab 01/29/16 2037 01/30/16 0118  NA 142 141  K 4.0 3.9  CL 110 110  CO2 22 24  GLUCOSE 195* 186*  BUN 31* 32*  CREATININE 1.23 1.12  CALCIUM 8.4* 8.5*  AST 48*  --   ALT 94*  --   ALKPHOS 103  --   BILITOT 0.5  --     ------------------------------------------------------------------------------------------------------------------  Cardiac Enzymes  Recent Labs Lab 01/30/16 1242  TROPONINI 0.06*   ------------------------------------------------------------------------------------------------------------------  RADIOLOGY:  Ct Head Wo Contrast  01/29/2016  CLINICAL DATA:  Increased weakness and several falls today. EXAM: CT HEAD WITHOUT CONTRAST TECHNIQUE: Contiguous axial images were obtained from the base of the skull through the vertex without intravenous contrast. COMPARISON:  MRI brain 01/14/2016 FINDINGS: Ventricles, cisterns and other CSF spaces are within normal. There is mild chronic ischemic microvascular disease. There is no mass, mass effect, shift of midline structures or acute hemorrhage. There is no evidence of acute infarction. Non fusion of the anterior arch of C1. Remaining bony structures are within normal. Evidence of previous sinus surgery with fenestration of the medial wall of the maxillary sinuses. IMPRESSION: No acute intracranial findings. Chronic ischemic microvascular disease. Electronically Signed   By: Elberta Fortis M.D.   On: 01/29/2016 19:30   Ct Cervical Spine Wo Contrast  01/29/2016  CLINICAL DATA:  Increased weakness and several falls today. EXAM: CT HEAD WITHOUT CONTRAST TECHNIQUE: Contiguous axial images were obtained from the base of the skull through the vertex without intravenous contrast. COMPARISON:  MRI brain 01/14/2016 FINDINGS: Ventricles, cisterns and other CSF spaces are within normal. There is mild chronic ischemic microvascular disease. There is no mass, mass effect, shift of midline structures or acute hemorrhage. There is no evidence of acute infarction. Non fusion of the anterior arch of C1. Remaining bony structures are within normal. Evidence of previous sinus surgery with fenestration of the medial wall of the maxillary sinuses. IMPRESSION: No acute  intracranial findings. Chronic ischemic microvascular disease. Electronically Signed   By: Elberta Fortis M.D.   On: 01/29/2016 19:30   Mr Brain Wo Contrast  01/30/2016  CLINICAL DATA:  Weakness.  HIV positive.  Altered mental status. EXAM: MRI HEAD WITHOUT CONTRAST TECHNIQUE: Multiplanar, multiecho pulse sequences of the brain and surrounding structures were obtained without intravenous contrast. COMPARISON:  CT head 01/29/2016 FINDINGS: Image quality degraded by motion Mild atrophy and mild ventricular enlargement unchanged from prior studies. Negative for acute infarct. Chronic white matter changes are stable since the prior MRI of 01/14/2016. Mild hyperintensity in the pons left greater than right also unchanged Negative for mass or edema Negative for hemorrhage or fluid collection. Paranasal sinuses clear. Prior sinus surgery. Normal orbit and pituitary. IMPRESSION: Atrophy and chronic white matter changes are stable. No superimposed acute abnormality. Electronically Signed   By: Marlan Palau M.D.   On: 01/30/2016 11:38    EKG:   Orders placed or performed in visit on 01/29/16  . EKG 12-Lead  . EKG 12-Lead    ASSESSMENT AND PLAN:   Lissandro Dilorenzo is a 60 y.o. male presenting with Weakness and Fall . Admitted 01/29/2016 : Day #: 1 day   1. Weakness left >> right MRI brain normal Progressive over 2 weeks MRI brain and spine normal Seems to have hyperthyroidism which could be contributing. Discussed with neurology and Endocrine Dr. Tedd Sias  * Hyperthyroidism Repeat Free t4  *. HIV On Meds  *. Essential hypertension, Coreg  *. GERD without esophagitis PPI therapy  All the records are reviewed and case discussed with Care Management/Social Workerr. Management plans discussed with the patient, family and they are in agreement.  CODE STATUS: full TOTAL TIME TAKING CARE OF THIS PATIENT: 35 minutes.   POSSIBLE D/C IN 1-2DAYS, DEPENDING ON CLINICAL CONDITION.  Will need SNF at  discharge   Orie FishermanSudini, Shavona Gunderman R M.D on 01/30/2016 at 2:39 PM  Between 7am to 6pm - Pager - [phone]  After 6pm: House Pager: - [phone]  [facility]  Office  [phone]  CC: Primary care physician; [facility]

## 2016-01-30 NOTE — Clinical Social Work Placement (Signed)
   CLINICAL SOCIAL WORK PLACEMENT  NOTE  Date:  01/30/2016  Patient Details  Name: Brian [place] MRN: [id number] Date of Birth: 10-28-1955  Clinical Social Work is seeking post-discharge placement for this patient at the Skilled  Nursing Facility level of care (*CSW will initial, date and re-position this form in  chart as items are completed):  Yes   Patient/family provided with [facility] Clinical Social Work Department's list of facilities offering this level of care within the geographic area requested by the patient (or if unable, by the patient's family).  Yes   Patient/family informed of their freedom to choose among providers that offer the needed level of care, that participate in Medicare, Medicaid or managed care program needed by the patient, have an available bed and are willing to accept the patient.  Yes   Patient/family informed of [facility]'s ownership interest in [facility] and [facility], as well as of the fact that they are under no obligation to receive care at these facilities.  PASRR submitted to EDS on 01/30/16     PASRR number received on 01/30/16     Existing PASRR number confirmed on       FL2 transmitted to all facilities in geographic area requested by pt/family on 01/30/16     FL2 transmitted to all facilities within larger geographic area on       Patient informed that his/her managed care company has contracts with or will negotiate with certain facilities, including the following:            Patient/family informed of bed offers received.  Patient chooses bed at       Physician recommends and patient chooses bed at      Patient to be transferred to   on  .  Patient to be transferred to facility by       Patient family notified on   of transfer.  Name of family member notified:        PHYSICIAN       Additional Comment:    _______________________________________________ Haig ProphetMorgan, Sholanda Croson G, LCSW 01/30/2016, 2:17 PM

## 2016-01-30 NOTE — Evaluation (Signed)
Physical Therapy Evaluation Patient Details Name: Brian [place] MRN: [id number] DOB: 09/20/1955 Today's Date: 01/30/2016   History of Present Illness  Pt is a 60 y/o male that presents with progressive onset o Left sided weakness. He is a well controlled longstanding HIV patient admitted 6/6 with falls and bil LE weakness and pain for 2 weeks. On admit was afebrile, wbc nml, CMP nml x low albumin, mild anemia, low B12. CK nml.  Hx of COPD, asthma, DM, renal disorder, lung mass and MI. He lives at a boarding house. MRI negative for acute changes  Clinical Impression  Pt demonstrates decreased LLE strength and per OT note LUE strength as well. Decreased L ankle dorsiflexion and very poor balance in standing requiring assist to remain upright. Pt requires minA+1 for bed mobility and modA+1 for 3 steps forward/backwards. Pt unable to ambulate further as he reports his LLE will buckle if he continues walking. Pt is unsafe to discharge back to group home at this time and will need to go to SNF in order to facilitate safe transition to prior level of function. Pt will benefit from skilled PT services to address deficits in strength, balance, and mobility in order to return to full function at home.     Follow Up Recommendations SNF    Equipment Recommendations  None recommended by PT    Recommendations for Other Services       Precautions / Restrictions Precautions Precautions: Fall Restrictions Weight Bearing Restrictions: No      Mobility  Bed Mobility Overal bed mobility: Needs Assistance Bed Mobility: Supine to Sit;Sit to Supine     Supine to sit: Min assist Sit to supine: Min assist   General bed mobility comments: Attempted with HOB flat and no bed rails. Pt able to roll into R side but unable to come to sitting without support and cues for sequencing.   Transfers Overall transfer level: Needs assistance Equipment used: Rolling walker (2 wheeled) Transfers: Sit to/from  Stand Sit to Stand: Min assist         General transfer comment: Pt with considerable instability with posterior leaning during sit to stand transfers. He relies heavily on bilateral LE support on bed for stability. Cues for safe hand placement.   Ambulation/Gait Ambulation/Gait assistance: Mod assist Ambulation Distance (Feet): 3 Feet Assistive device: Rolling walker (2 wheeled) Gait Pattern/deviations: Decreased step length - right;Decreased step length - left;Decreased dorsiflexion - left Gait velocity: Decreased   General Gait Details: Pt demonstrates decreased dorsiflexion on L foot. Attempted ambulation but only able to take 3 steps forward but reports he cannot walk any further at this time due to fear that LLE will buckle. Pt with posterior leaning requiring modA+1 to remain upright  Stairs            Wheelchair Mobility    Modified Rankin (Stroke Patients Only)       Balance Overall balance assessment: Needs assistance Sitting-balance support: No upper extremity supported Sitting balance-Leahy Scale: Fair     Standing balance support: No upper extremity supported Standing balance-Leahy Scale: Poor Standing balance comment: Pt unable to maintain static standing balance without falling backwards                             Pertinent Vitals/Pain Pain Assessment: 0-10 Pain Score: 10-Worst pain ever Pain Location: "entire L side, stomach, and head" Pain Descriptors / Indicators: Constant Pain Intervention(s): Monitored during session  Home Living Family/patient expects to be discharged to:: Group home (Boarding house) Living Arrangements: Group Home Available Help at Discharge: Personal care attendant Type of Home: House Home Access: Stairs to enter Entrance Stairs-Rails: Right Entrance Stairs-Number of Steps: 1 Home Layout: Two level;Able to live on main level with bedroom/bathroom Home Equipment: Walker - 2 wheels (no BSC, no cane, no  showe chair) Additional Comments: 1 step in    Prior Function Level of Independence: Independent with assistive device(s)         Comments: Independent with ADLs. Pt has help from sister with IADLs. States that he ambulates around group home with rolling walker     Hand Dominance   Dominant Hand: Right    Extremity/Trunk Assessment   Upper Extremity Assessment: Defer to OT evaluation RUE Deficits / Details: WFL     LUE Deficits / Details: 2/5 shoulder flexion, abduction, 3+/5 elbow flexion/extension, 4-/5 wrist extension. Grip strength Left 4#, Right 10#. (Pain with bicep movement)   Lower Extremity Assessment: LLE deficits/detail   LLE Deficits / Details: Grossly WFL throughout RLE. LLE: Pt demonstrates 2+/5 L ankle DF. Reports decreased sensation throughout LLE. L knee flexion/extension grossly 4+/5. L hip flexion 3+/5. No clonus noted in LLE but pt is very tremulous throughout examination. L ankle weakness is new since last admission per PT notes     Communication   Communication: No difficulties  Cognition Arousal/Alertness: Awake/alert Behavior During Therapy: WFL for tasks assessed/performed Overall Cognitive Status: Within Functional Limits for tasks assessed                      General Comments General comments (skin integrity, edema, etc.): Abrasions to R hand and both knees which are old    Exercises        Assessment/Plan    PT Assessment Patient needs continued PT services  PT Diagnosis Difficulty walking;Generalized weakness;Abnormality of gait;Acute pain   PT Problem List Decreased strength;Decreased balance;Decreased mobility;Decreased safety awareness;Pain  PT Treatment Interventions DME instruction;Gait training;Stair training;Therapeutic activities;Therapeutic exercise;Balance training;Neuromuscular re-education;Patient/family education   PT Goals (Current goals can be found in the Care Plan section) Acute Rehab PT Goals Patient Stated  Goal: To return to prior level of function PT Goal Formulation: With patient Time For Goal Achievement: 02/13/16 Potential to Achieve Goals: Good    Frequency Min 2X/week   Barriers to discharge Decreased caregiver support Lives at group home but no formal caregiver reported    Co-evaluation               End of Session Equipment Utilized During Treatment: Gait belt Activity Tolerance: Patient tolerated treatment well Patient left: with call bell/phone within reach;in bed;with bed alarm set Nurse Communication: Mobility status    Functional Assessment Tool Used: Clinical judgement  Functional Limitation: Mobility: Walking and moving around Mobility: Walking and Moving Around Current Status ([id number](G8978): At least 60 percent but less than 80 percent impaired, limited or restricted Mobility: Walking and Moving Around Goal Status [phone](G8979): At least 20 percent but less than 40 percent impaired, limited or restricted    Time: 1159-1221 PT Time Calculation (min) (ACUTE ONLY): 22 min   Charges:   PT Evaluation $PT Eval Moderate Complexity: 1 Procedure     PT G Codes:   PT G-Codes **NOT FOR INPATIENT CLASS** Functional Assessment Tool Used: Clinical judgement  Functional Limitation: Mobility: Walking and moving around Mobility: Walking and Moving Around Current Status (H0865(G8978): At least 60 percent but less than 80 percent  impaired, limited or restricted Mobility: Walking and Moving Around Goal Status [phone]([phone]): At least 20 percent but less than 40 percent impaired, limited or restricted   Lynnea MaizesJason D Huprich PT, DPT   Huprich,Jason 01/30/2016, 12:39 PM

## 2016-01-30 NOTE — Care Management (Signed)
Patient readmit after "falling 4 times" at boarding home. Has rolling walker. Open to [facility].

## 2016-01-30 NOTE — Progress Notes (Signed)
Centralized telemetry notified nurse pt had a short run of SVT. Dr Clint GuyHower notified. No orders obtained

## 2016-01-30 NOTE — Evaluation (Signed)
Occupational Therapy Evaluation Patient Details Name: Robina Adeommy L Clyatt MRN: [id number] DOB: [birth year] Today's Date: 01/30/2016    History of Present Illness Pt is a 60 y/o male that presents with progressive onset o Left sided weakness. He is a well controlled longstanding HIV patient admitted 6/6 with falls and bil LE weakness and pain for 2 weeks. On admit was afebrile, wbc nml, CMP nml x low albumin, mild anemia, low B12. CK nml.  Hx of COPD, asthma, DM, renal disorder, lung mass and MI. He lives at a boarding house.   Clinical Impression   Pt. Is a 60 y.o. Male who was admitted with left sided weakness. Pt. Has a history of HIV and resides in a boarding house. Pt. Was receiving Home health services, Pt. Presents with limited LUE AROM, pain, weakness, decreased strength, impaired functional mobility, and decreased activity tolerance which hinder his ability to complete ADL and IADL functioning. Pt. Could benefit from skilled OT services for ADL and A/E training, UE therapeutic ex., Work simplification/energy conservation techniques,  Functional mobility for ADls, and pt. Education in order to return to his PLOF.     Follow Up Recommendations  SNF    Equipment Recommendations  Tub/shower seat    Recommendations for Other Services       Precautions / Restrictions Precautions Precautions: Fall Restrictions Weight Bearing Restrictions: No      Mobility Bed Mobility Overal bed mobility: Needs Assistance Bed Mobility: Supine to Sit     Supine to sit: Mod assist                              Balance Overall balance assessment: Needs assistance   Sitting balance-Leahy Scale: Fair                                      ADL Overall ADL's : Needs assistance/impaired Eating/Feeding: Moderate assistance;Set up;Minimal assistance   Grooming: Moderate assistance           Upper Body Dressing : Moderate assistance   Lower Body Dressing: Maximal  assistance               Functional mobility during ADLs: Moderate assistance       Vision     Perception     Praxis      Pertinent Vitals/Pain Pain Assessment: 0-10 Pain Score: 10-Worst pain ever Pain Location: Entire left side.     Hand Dominance Right   Extremity/Trunk Assessment Upper Extremity Assessment Upper Extremity Assessment: Generalized weakness;RUE deficits/detail RUE Deficits / Details: WFL LUE Deficits / Details: 2/5 shoulder flexion, abduction, 3/5 elbow flexion/extension, 3+/5 wrist extension. Grip strength Left 4#, Right 10#. (Pain with bicep movement) LUE: Unable to fully assess due to pain           Communication Communication Communication: No difficulties   Cognition Arousal/Alertness: Awake/alert Behavior During Therapy: WFL for tasks assessed/performed Overall Cognitive Status: Within Functional Limits for tasks assessed                     General Comments       Exercises       Shoulder Instructions      Home Living Family/patient expects to be discharged to:: Group home (Boarding house)  Additional Comments: 1 step in      Prior Functioning/Environment Level of Independence: Independent        Comments: Independent with ADLs.    OT Diagnosis: Generalized weakness   OT Problem List: Decreased strength;Decreased range of motion;Decreased activity tolerance;Impaired balance (sitting and/or standing);Decreased safety awareness;Decreased coordination;Impaired UE functional use   OT Treatment/Interventions: Self-care/ADL training;DME and/or AE instruction;Therapeutic activities    OT Goals(Current goals can be found in the care plan section) Acute Rehab OT Goals Patient Stated Goal: To be indepndent OT Goal Formulation: With patient Time For Goal Achievement: 02/13/16 Potential to Achieve Goals: Good  OT Frequency: Min 1X/week   Barriers to D/C:             Co-evaluation              End of Session Equipment Utilized During Treatment: Gait belt  Activity Tolerance: Patient tolerated treatment well Patient left: in bed;with call bell/phone within reach;with bed alarm set   Time: 0920-0950 OT Time Calculation (min): 30 min Charges:  OT General Charges $OT Visit: 1 Procedure OT Evaluation $OT Eval Moderate Complexity: 1 Procedure G-Codes: OT G-codes **NOT FOR INPATIENT CLASS** Functional Limitation: Self care Self Care Current Status (R6045(G8987): At least 40 percent but less than 60 percent impaired, limited or restricted Self Care Goal Status (W0981(G8988): At least 1 percent but less than 20 percent impaired, limited or restricted  Patricia PesaElaine Rickie Gutierres Kamori Kitchens, MS, OTR/L 01/30/2016, 11:38 AM

## 2016-01-30 NOTE — Clinical Social Work Note (Addendum)
Clinical Social Work Assessment  Patient Details  Name: Brian [place] MRN: [id number] Date of Birth: 10-30-1955  Date of referral:  01/30/16               Reason for consult:  Facility Placement                Permission sought to share information with:  Chartered certified accountant granted to share information::  Yes, Verbal Permission Granted  Name::      Brian Hampton::   Brian Hampton   Relationship::     Contact Information:     Housing/Transportation Living arrangements for the past 2 months:  Tour manager of Information:  Patient Patient Interpreter Needed:  None Criminal Activity/Legal Involvement Pertinent to Current Situation/Hospitalization:  No - Comment as needed Significant Relationships:  Other(Comment) Designer, industrial/product ) Lives with:  Self, Facility Resident Do you feel safe going back to the place where you live?  Yes Need for family participation in patient care:  No (Coment)  Care giving concerns:  Patient has lived in a boarding house in [place] for the past 4 years.    Social Worker assessment / plan:  Holiday representative (CSW) received verbal consult from PT that recommendation is SNF. CSW met with patient alone at bedside to discuss D/C plan. CSW introduced self and explained role of CSW department. Patient was alert and oriented and answered questions appropriately. Per patient he walks with a walker at baseline and has not used alcohol in 16 years. Patient denied drug use and reported that he has not smoked cigarettes in 20 years. Patient spoke highly of the boarding home staff and reported that they take good care of him. Patient reported that he has a home health RN and PT that see him weekly. Patient reported that he does not have a HPOA or guardian and has no family. CSW explained that PT is recommending SNF and that he will need a 3 night inpatient qualfying stay in order for Medicare to pay for rehab. Patient  was admitted to inpatient on 01/29/16. Patient is agreeable to SNF search in [facility] Per patient he will go to SNF if he needs to however he prefers to go home. CSW contacted patient's sister Brian Hampton. Per sister patient does not have a guardian and makes his own decisions. Sister is agreeable to SNF search and would like to be notified when and where patient is discharged to.   FL2 complete and faxed out. CSW will continue to follow and assist as needed.   Employment status:  Disabled (Comment on whether or not currently receiving Disability), Retired Forensic scientist:  Information systems manager, Medicaid In [place] PT Recommendations:  [place] / Referral to community resources:  [place]  Patient/Family's Response to care: Patient is agreeable to [facility] in [place].   Patient/Family's Understanding of and Emotional Response to Diagnosis, Current Treatment, and Prognosis:  Patient was pleasant and thanked CSW for visit.   Emotional Assessment Appearance:  Appears stated age Attitude/Demeanor/Rapport:    Affect (typically observed):  Accepting, Adaptable, Pleasant Orientation:  Oriented to Self, Oriented to Place, Oriented to Situation Alcohol / Substance use:  Not Applicable Psych involvement (Current and /or in the community):  No (Comment)  Discharge Needs  Concerns to be addressed:  Discharge Planning Concerns Readmission within the last 30 days:  No Current discharge risk:  Dependent with Mobility Barriers to Discharge:  Continued Medical Work  up   Brian Hampton 01/30/2016, 2:34 PM

## 2016-01-30 NOTE — Progress Notes (Signed)
Inpatient Diabetes Program Recommendations  AACE/ADA: New Consensus Statement on Inpatient Glycemic Control (2015)  Target Ranges:  Prepandial:   less than 140 mg/dL      Peak postprandial:   less than 180 mg/dL (1-2 hours)      Critically ill patients:  140 - 180 mg/dL   Lab Results  Component Value Date   GLUCAP 252* 01/29/2016   HGBA1C 6.2* 01/15/2016    Review of Glycemic Control  Results for Brian Hampton, Kailash L (MRN [id number]) as of 01/30/2016 10:44  Ref. Range 01/29/2016 20:59  Glucose-Capillary Latest Ref Range: 65-99 mg/dL 409252 (H)   Results for Brian Hampton, [name] (MRN [id number]) as of 01/30/2016 10:44 (lab glucose)  Ref. Range 01/29/2016 20:37 01/29/2016 21:12 01/29/2016 22:47 01/30/2016 01:18 01/30/2016 07:03  Glucose Latest Ref Range: 65-99 mg/dL 956195 (H)   213186 (H)    Diabetes history: Type 2 Outpatient Diabetes medications: none Current orders for Inpatient glycemic control: none  Inpatient Diabetes Program Recommendations:  Patient has a history of Type 2 diabetes.  Elevated CBG x1 in ED and elevated lab glucose . Please consider ordering CBG tid and hs. May need Novolog sensitive correction scale 0-9 units tid and hs.  Please change diet to carb modified/ heart healthy.  Susette RacerJulie Elton Catalano, RN, BA, MHA, CDE Diabetes Coordinator Inpatient Diabetes Program  [phone] (Team Pager) [phone] [facility] Office) 01/30/2016 10:47 AM

## 2016-01-30 NOTE — Progress Notes (Addendum)
Clinical Child psychotherapistocial Worker (CSW) presented bed offers to patient. He chose [facility]. Per [place] admissions coordinator at [facility] patient will go to room 33-A (fax: [phone]).   Jetta LoutBailey Morgan, LCSW [phone]

## 2016-01-31 ENCOUNTER — Encounter: Payer: Self-pay | Admitting: Radiology

## 2016-01-31 ENCOUNTER — Inpatient Hospital Stay: Payer: Medicare Other

## 2016-01-31 DIAGNOSIS — D649 Anemia, unspecified: Secondary | ICD-10-CM

## 2016-01-31 DIAGNOSIS — D696 Thrombocytopenia, unspecified: Secondary | ICD-10-CM

## 2016-01-31 DIAGNOSIS — G720 Drug-induced myopathy: Secondary | ICD-10-CM

## 2016-01-31 DIAGNOSIS — R7401 Elevation of levels of liver transaminase levels: Secondary | ICD-10-CM

## 2016-01-31 DIAGNOSIS — E0590 Thyrotoxicosis, unspecified without thyrotoxic crisis or storm: Secondary | ICD-10-CM

## 2016-01-31 DIAGNOSIS — R8281 Pyuria: Secondary | ICD-10-CM

## 2016-01-31 DIAGNOSIS — T380X5A Adverse effect of glucocorticoids and synthetic analogues, initial encounter: Secondary | ICD-10-CM

## 2016-01-31 DIAGNOSIS — R740 Nonspecific elevation of levels of transaminase and lactic acid dehydrogenase [LDH]: Secondary | ICD-10-CM

## 2016-01-31 LAB — URINALYSIS COMPLETE WITH MICROSCOPIC (ARMC ONLY)
BACTERIA UA: NONE SEEN
Bilirubin Urine: NEGATIVE
Glucose, UA: >500 mg/dL — AB
Ketones, ur: NEGATIVE mg/dL
LEUKOCYTES UA: NEGATIVE
NITRITE: NEGATIVE
PROTEIN: 30 mg/dL — AB
SPECIFIC GRAVITY, URINE: 1.017 (ref 1.005–1.030)
pH: 5 (ref 5.0–8.0)

## 2016-01-31 LAB — HEPATIC FUNCTION PANEL
ALBUMIN: 2.3 g/dL — AB (ref 3.5–5.0)
ALK PHOS: 95 U/L (ref 38–126)
ALT: 86 U/L — AB (ref 17–63)
AST: 31 U/L (ref 15–41)
BILIRUBIN TOTAL: 0.4 mg/dL (ref 0.3–1.2)
Bilirubin, Direct: <0.1 mg/dL — ABNORMAL LOW (ref 0.1–0.5)
Total Protein: 5.6 g/dL — ABNORMAL LOW (ref 6.5–8.1)

## 2016-01-31 LAB — GLUCOSE, CAPILLARY
Glucose-Capillary: 190 mg/dL — ABNORMAL HIGH (ref 65–99)
Glucose-Capillary: 323 mg/dL — ABNORMAL HIGH (ref 65–99)
Glucose-Capillary: 181 mg/dL — ABNORMAL HIGH (ref 65–99)
Glucose-Capillary: 253 mg/dL — ABNORMAL HIGH (ref 65–99)

## 2016-01-31 MED ORDER — METHIMAZOLE 10 MG PO TABS: 10.0000 mg | ORAL_TABLET | Freq: Every day | ORAL | Status: DC

## 2016-01-31 MED ORDER — DIATRIZOATE MEGLUMINE & SODIUM 66-10 % PO SOLN
15.0000 mL | ORAL | Status: AC
  Administered 2016-01-31 (×2): 15 mL via ORAL

## 2016-01-31 MED ORDER — TRAMADOL HCL 50 MG PO TABS: 50.0000 mg | ORAL_TABLET | Freq: Two times a day (BID) | ORAL | Status: DC

## 2016-01-31 MED ORDER — IOPAMIDOL (ISOVUE-300) INJECTION 61%
100.0000 mL | Freq: Once | INTRAVENOUS | Status: AC | PRN
  Administered 2016-01-31: 100 mL via INTRAVENOUS

## 2016-01-31 MED ORDER — CLONAZEPAM 0.5 MG PO TABS: 0.5000 mg | ORAL_TABLET | Freq: Two times a day (BID) | ORAL | Status: DC | PRN

## 2016-01-31 MED ORDER — SENNOSIDES-DOCUSATE SODIUM 8.6-50 MG PO TABS: 1.0000 | ORAL_TABLET | Freq: Every evening | ORAL | Status: DC | PRN

## 2016-01-31 MED ORDER — ZOLPIDEM TARTRATE ER 12.5 MG PO TBCR: 12.5000 mg | EXTENDED_RELEASE_TABLET | Freq: Every evening | ORAL | Status: DC | PRN

## 2016-01-31 NOTE — Progress Notes (Deleted)
Pt blood sugar down to 56. Pt diaphoretic and lethargic. Orange juice peanut butter and crackers given will continue to monitor.

## 2016-01-31 NOTE — Discharge Summary (Signed)
[facility] [place] [facility]   PATIENT NAME: Brian Hampton    MR#:  [id number]  DATE OF BIRTH:  May 28, 1956  DATE OF ADMISSION:  01/29/2016 ADMITTING PHYSICIAN: Keturah BarreJames J Crossley, MD  DATE OF DISCHARGE: No discharge date for patient encounter.  PRIMARY CARE PHYSICIAN: [facility]     ADMISSION DIAGNOSIS:  Weakness [R53.1] Frequent falls [R29.6]  DISCHARGE DIAGNOSIS:  Principal Problem:   Weakness Active Problems:   Hyperthyroidism   Steroid-induced myopathy   HIV (human immunodeficiency virus infection) (HCC)   Elevated transaminase level   Anemia   Thrombocytopenia (HCC)   Pyuria   CAD (coronary artery disease)   COPD (chronic obstructive pulmonary disease) (HCC)   Diabetes mellitus (HCC)   SECONDARY DIAGNOSIS:   Past Medical History  Diagnosis Date  . Asthma   . COPD (chronic obstructive pulmonary disease) (HCC)   . Coronary artery disease   . Lung mass   . Anemia   . Emphysema/COPD (HCC)   . Myocardial infarction (HCC)     in 2000  . Type 2 diabetes mellitus (HCC)   . HIV (human immunodeficiency virus infection) (HCC)     .pro HOSPITAL COURSE:   Patient is 60 year old Caucasian male with past medical history significant for history of HIV , generalized weakness, who was admitted to the hospital recently with negative workup and recommendations to get outpatient EMG who presents to the hospital with a worsening weakness in left upper and lower extremities. He also admitted of falls and numbness in the left lower extremity. On arrival to the hospital patient was noted to have mild elevation of transaminases, troponin, mild anemia and thrombocytopenia. Hemoglobin A1c was checked, was found to be 7.2. Patient's TSH was found to be low at 0.037, free T4 was elevated at 1.87. Patient was seen by endocrinologist, Dr. Tedd SiasSolum, who felt the patient has hyperthyroidism, also likely steroid-induced myopathy attributed to  combination of Dulera and Genvoya. She recommended to initiate patient on methimazole, obtain thyroid antibodies to assess for Graves' disease, repeat TSH and free T4 in 3 days, stop methimazole. If thyroid labs are normal and thyroid antibodies are negative, which would suggest thyroiditis. Follow-up with Dr. Tedd SiasSolum as outpatient. In regards to steroid-induced myopathy, she felt the diagnosis is clinical,  Genvoya was stopped. She felt that he should expect gradual improvement in the upcoming weeks with his steroid-induced myopathy. In regards to HIV, the patient's Genvoya  was stopped and replaced with Triumeq. Patient was seen by physical therapist and recommended skilled nursing facility placement Discussion by problem: #1 Weakness left >> right MRI brain normal Progressive over 2 weeks, suspected due to steroid-induced myopathy, [facility] GENVOYA, which was stopped and replaced with Triumeq.  MRI brain and spine normal Seems to have hyperthyroidism which could be contributing. Discussed with neurology and Endocrine Dr. Tedd SiasSolum in the past  * Hyperthyroidism, now on methimazole Repeat Free t4 on Monday, discontinue methimazole if labs are normal  *. HIV, GENVOYA was changed to Triumeq by Dr. Sampson GoonFitzgerald   *. Essential hypertension, Coreg, satisfactorily controlled  *. GERD without esophagitis PPI therapy  DISCHARGE CONDITIONS:   Stable  CONSULTS OBTAINED:  Treatment Team:  Kym GroomNeuro1 [facility], MD  DRUG ALLERGIES:   Allergies  Allergen Reactions  . Aspirin Anaphylaxis  . Bee Venom Anaphylaxis  . Penicillins Anaphylaxis and Other (See Comments)    Has patient had a PCN reaction causing immediate rash, facial/tongue/throat swelling, SOB or lightheadedness with hypotension:  Yes Has patient had a PCN reaction causing severe rash involving mucus membranes or skin necrosis: No Has patient had a PCN reaction that required hospitalization No Has patient had a PCN reaction occurring  within the last 10 years: Yes If all of the above answers are "NO", then may proceed with Cephalosporin use.  . Prednisone Anaphylaxis  . Sulfa Antibiotics Anaphylaxis  . Sulfasalazine Anaphylaxis  . Theophyllines Anaphylaxis  . Theophylline Swelling    DISCHARGE MEDICATIONS:   Current Discharge Medication List    START taking these medications   Details  methimazole (TAPAZOLE) 10 MG tablet Take 1 tablet (10 mg total) by mouth daily. Qty: 30 tablet, Refills: 3    senna-docusate (SENOKOT-S) 8.6-50 MG tablet Take 1 tablet by mouth at bedtime as needed for mild constipation. Qty: 30 tablet, Refills: 3      CONTINUE these medications which have CHANGED   Details  clonazePAM (KLONOPIN) 0.5 MG tablet Take 1 tablet (0.5 mg total) by mouth 2 (two) times daily as needed for anxiety. Qty: 30 tablet, Refills: 0    traMADol (ULTRAM) 50 MG tablet Take 1 tablet (50 mg total) by mouth 2 (two) times daily. Qty: 30 tablet, Refills: 0    zolpidem (AMBIEN CR) 12.5 MG CR tablet Take 1 tablet (12.5 mg total) by mouth at bedtime as needed for sleep. Qty: 30 tablet, Refills: 0      CONTINUE these medications which have NOT CHANGED   Details  albuterol (PROVENTIL HFA;VENTOLIN HFA) 108 (90 BASE) MCG/ACT inhaler Inhale 2 puffs into the lungs every 6 (six) hours as needed for wheezing or shortness of breath.    albuterol (PROVENTIL) (2.5 MG/3ML) 0.083% nebulizer solution Take 2.5 mg by nebulization every 6 (six) hours as needed for wheezing or shortness of breath.    atorvastatin (LIPITOR) 40 MG tablet Take 40 mg by mouth at bedtime.    Calcium Carbonate-Vitamin D (CALCIUM 600+D) 600-400 MG-UNIT tablet Take 1 tablet by mouth 2 (two) times daily.    carvedilol (COREG) 12.5 MG tablet Take 12.5 mg by mouth 2 (two) times daily with a meal.    cetirizine (ZYRTEC) 10 MG tablet Take 10 mg by mouth daily.    EPINEPHrine (EPIPEN 2-PAK) 0.3 mg/0.3 mL IJ SOAJ injection Inject 0.3 mg into the muscle once as  needed (for severe allergic reaction).    escitalopram (LEXAPRO) 20 MG tablet Take 20 mg by mouth daily.    fluconazole (DIFLUCAN) 200 MG tablet Take 200 mg by mouth daily.    fluticasone (FLONASE) 50 MCG/ACT nasal spray Place 1 spray into both nostrils daily.    gemfibrozil (LOPID) 600 MG tablet Take 600 mg by mouth 2 (two) times daily before a meal.    isosorbide mononitrate (IMDUR) 30 MG 24 hr tablet Take 30 mg by mouth daily.    linaclotide (LINZESS) 290 MCG CAPS capsule Take 290 mcg by mouth daily before breakfast.    meloxicam (MOBIC) 7.5 MG tablet Take 7.5 mg by mouth 2 (two) times daily.    mirtazapine (REMERON) 30 MG tablet Take 30 mg by mouth at bedtime.    mometasone-formoterol (DULERA) 200-5 MCG/ACT AERO Inhale 2 puffs into the lungs 2 (two) times daily.     Multiple Vitamin (MULTIVITAMIN WITH MINERALS) TABS tablet Take 1 tablet by mouth daily.    nitroGLYCERIN (NITROSTAT) 0.4 MG SL tablet Place 0.4 mg under the tongue every 5 (five) minutes as needed for chest pain.    omeprazole (PRILOSEC) 20 MG capsule Take  20 mg by mouth 2 (two) times daily before a meal.    tiotropium (SPIRIVA) 18 MCG inhalation capsule Place 18 mcg into inhaler and inhale daily.    abacavir-dolutegravir-lamiVUDine (TRIUMEQ) 600-50-300 MG tablet Take 1 tablet by mouth daily. Reported on 01/30/2016      STOP taking these medications     elvitegravir-cobicistat-emtricitabine-tenofovir (GENVOYA) 150-150-200-10 MG TABS tablet          DISCHARGE INSTRUCTIONS:    Patient is to follow-up with endocrinologist, infectious disease specialist, primary care physician within one week after discharge  If you experience worsening of your admission symptoms, develop shortness of breath, life threatening emergency, suicidal or homicidal thoughts you must seek medical attention immediately by calling 911 or calling your MD immediately  if symptoms less severe.  You Must read complete  instructions/literature along with all the possible adverse reactions/side effects for all the Medicines you take and that have been prescribed to you. Take any new Medicines after you have completely understood and accept all the possible adverse reactions/side effects.   Please note  You were cared for by a hospitalist during your hospital stay. If you have any questions about your discharge medications or the care you received while you were in the hospital after you are discharged, you can call the unit and asked to speak with the hospitalist on call if the hospitalist that took care of you is not available. Once you are discharged, your primary care physician will handle any further medical issues. Please note that NO REFILLS for any discharge medications will be authorized once you are discharged, as it is imperative that you return to your primary care physician (or establish a relationship with a primary care physician if you do not have one) for your aftercare needs so that they can reassess your need for medications and monitor your lab values.    Today   CHIEF COMPLAINT:   Chief Complaint  Patient presents with  . Weakness  . Fall    HISTORY OF PRESENT ILLNESS:  Travarius Lange  is a 60 y.o. male with a known history of HIV , generalized weakness, who was admitted to the hospital recently with negative workup and recommendations to get outpatient EMG who presents to the hospital with a worsening weakness in left upper and lower extremities. He also admitted of falls and numbness in the left lower extremity. On arrival to the hospital patient was noted to have mild elevation of transaminases, troponin, mild anemia and thrombocytopenia. Hemoglobin A1c was checked, was found to be 7.2. Patient's TSH was found to be low at 0.037, free T4 was elevated at 1.87. Patient was seen by endocrinologist, Dr. Tedd Sias, who felt the patient has hyperthyroidism, also likely steroid-induced myopathy attributed  to combination of Dulera and Genvoya. She recommended to initiate patient on methimazole, obtain thyroid antibodies to assess for Graves' disease, repeat TSH and free T4 in 3 days, stop methimazole. If thyroid labs are normal and thyroid antibodies are negative, which would suggest thyroiditis. Follow-up with Dr. Tedd Sias as outpatient. In regards to steroid-induced myopathy, she felt the diagnosis is clinical,  Genvoya was stopped. She felt that he should expect gradual improvement in the upcoming weeks with his steroid-induced myopathy. In regards to HIV, the patient's Genvoya  was stopped and replaced with Triumeq. Patient was seen by physical therapist and recommended skilled nursing facility placement Discussion by problem: #1 Weakness left >> right MRI brain normal Progressive over 2 weeks, suspected due to steroid-induced myopathy, due  to [facility] and GENVOYA, which was stopped and replaced with Triumeq.  MRI brain and spine normal Seems to have hyperthyroidism which could be contributing. Discussed with neurology and Endocrine Dr. Tedd Sias in the past  * Hyperthyroidism, now on methimazole Repeat Free t4 on Monday, discontinue methimazole if labs are normal  *. HIV, GENVOYA was changed to Triumeq by Dr. Sampson Goon   *. Essential hypertension, Coreg, satisfactorily controlled  *. GERD without esophagitis PPI therapy     VITAL SIGNS:  Blood pressure 149/85, pulse 95, temperature 98.2 F (36.8 C), temperature source Oral, resp. rate 14, height 5\' 5"  (1.651 m), weight 68.04 kg (150 lb), SpO2 96 %.  I/O:   Intake/Output Summary (Last 24 hours) at 01/31/16 1045 Last data filed at 01/31/16 0800  Gross per 24 hour  Intake    600 ml  Output    800 ml  Net   -200 ml    PHYSICAL EXAMINATION:  GENERAL:  60 y.o.-year-old patient lying in the bed with no acute distress.  EYES: Pupils equal, round, reactive to light and accommodation. No scleral icterus. Extraocular muscles intact.  HEENT:  Head atraumatic, normocephalic. Oropharynx and nasopharynx clear.  NECK:  Supple, no jugular venous distention. No thyroid enlargement, no tenderness.  LUNGS: Normal breath sounds bilaterally, no wheezing, rales,rhonchi or crepitation. No use of accessory muscles of respiration.  CARDIOVASCULAR: S1, S2 normal. No murmurs, rubs, or gallops.  ABDOMEN: Soft, non-tender, non-distended. Bowel sounds present. No organomegaly or mass.  EXTREMITIES: No pedal edema, cyanosis, or clubbing.  NEUROLOGIC: Cranial nerves II through XII are intact. Muscle strength 4/5 in all extremities. Sensation intact. Gait not checked.  PSYCHIATRIC: The patient is alert and oriented x 3. Some slurring of speech SKIN: No obvious rash, lesion, or ulcer.   DATA REVIEW:   CBC  Recent Labs Lab 01/30/16 0118  WBC 7.1  HGB 10.9*  HCT 32.6*  PLT 100*    Chemistries   Recent Labs Lab 01/30/16 0118 01/31/16 0431  NA 141  --   K 3.9  --   CL 110  --   CO2 24  --   GLUCOSE 186*  --   BUN 32*  --   CREATININE 1.12  --   CALCIUM 8.5*  --   AST  --  31  ALT  --  86*  ALKPHOS  --  95  BILITOT  --  0.4    Cardiac Enzymes  Recent Labs Lab 01/30/16 1242  TROPONINI 0.06*    Microbiology Results  Results for orders placed or performed during the hospital encounter of 01/13/16  CSF culture     Status: None   Collection Time: 01/15/16 12:19 PM  Result Value Ref Range Status   Specimen Description CSF  Final   Special Requests Immunocompromised  Final   Gram Stain NO ORGANISMS SEEN NO WBC SEEN CYTOSPIN SMEAR   Final   Culture   Final    NO GROWTH 3 DAYS Performed at [facility]    Report Status 01/19/2016 FINAL  Final  Culture, fungus without smear ([facility]-Only)     Status: None (Preliminary result)   Collection Time: 01/15/16 12:19 PM  Result Value Ref Range Status   Specimen Description CSF  Final   Special Requests Immunocompromised  Final   Culture   Final    NO FUNGUS ISOLATED AFTER  14 DAYS Performed at [facility]    Report Status PENDING  Incomplete    RADIOLOGY:  Ct  Head Wo Contrast  01/29/2016  CLINICAL DATA:  Increased weakness and several falls today. EXAM: CT HEAD WITHOUT CONTRAST TECHNIQUE: Contiguous axial images were obtained from the base of the skull through the vertex without intravenous contrast. COMPARISON:  MRI brain 01/14/2016 FINDINGS: Ventricles, cisterns and other CSF spaces are within normal. There is mild chronic ischemic microvascular disease. There is no mass, mass effect, shift of midline structures or acute hemorrhage. There is no evidence of acute infarction. Non fusion of the anterior arch of C1. Remaining bony structures are within normal. Evidence of previous sinus surgery with fenestration of the medial wall of the maxillary sinuses. IMPRESSION: No acute intracranial findings. Chronic ischemic microvascular disease. Electronically Signed   By: Elberta Fortis M.D.   On: 01/29/2016 19:30   Ct Cervical Spine Wo Contrast  01/29/2016  CLINICAL DATA:  Increased weakness and several falls today. EXAM: CT HEAD WITHOUT CONTRAST TECHNIQUE: Contiguous axial images were obtained from the base of the skull through the vertex without intravenous contrast. COMPARISON:  MRI brain 01/14/2016 FINDINGS: Ventricles, cisterns and other CSF spaces are within normal. There is mild chronic ischemic microvascular disease. There is no mass, mass effect, shift of midline structures or acute hemorrhage. There is no evidence of acute infarction. Non fusion of the anterior arch of C1. Remaining bony structures are within normal. Evidence of previous sinus surgery with fenestration of the medial wall of the maxillary sinuses. IMPRESSION: No acute intracranial findings. Chronic ischemic microvascular disease. Electronically Signed   By: Elberta Fortis M.D.   On: 01/29/2016 19:30   Mr Brain Wo Contrast  01/30/2016  CLINICAL DATA:  Weakness.  HIV positive.  Altered mental  status. EXAM: MRI HEAD WITHOUT CONTRAST TECHNIQUE: Multiplanar, multiecho pulse sequences of the brain and surrounding structures were obtained without intravenous contrast. COMPARISON:  CT head 01/29/2016 FINDINGS: Image quality degraded by motion Mild atrophy and mild ventricular enlargement unchanged from prior studies. Negative for acute infarct. Chronic white matter changes are stable since the prior MRI of 01/14/2016. Mild hyperintensity in the pons left greater than right also unchanged Negative for mass or edema Negative for hemorrhage or fluid collection. Paranasal sinuses clear. Prior sinus surgery. Normal orbit and pituitary. IMPRESSION: Atrophy and chronic white matter changes are stable. No superimposed acute abnormality. Electronically Signed   By: Marlan Palau M.D.   On: 01/30/2016 11:38    EKG:   Orders placed or performed in visit on 01/29/16  . EKG 12-Lead  . EKG 12-Lead  . EKG 12-Lead      Management plans discussed with the patient, family and they are in agreement.  CODE STATUS:     Code Status Orders        Start     Ordered   01/30/16 0055  Do not attempt resuscitation (DNR)   Continuous    Question Answer Comment  In the event of cardiac or respiratory ARREST Do not call a "code blue"   In the event of cardiac or respiratory ARREST Do not perform Intubation, CPR, defibrillation or ACLS   In the event of cardiac or respiratory ARREST Use medication by any route, position, wound care, and other measures to relive pain and suffering. May use oxygen, suction and manual treatment of airway obstruction as needed for comfort.      01/30/16 0054    Code Status History    Date Active Date Inactive Code Status Order ID Comments User Context   01/13/2016  9:46 PM 01/16/2016  4:18  PM Full Code [id number]  Shaune PollackQing Chen, MD Inpatient   06/24/2015  7:13 AM 06/25/2015 12:27 PM Full Code [id number]  [name], MD ED    Advance Directive Documentation        Most Recent Value    Type of Advance Directive  Living will   Pre-existing out of facility DNR order (yellow form or pink MOST form)     "MOST" Form in Place?        TOTAL TIME TAKING CARE OF THIS PATIENT: 40 minutes.    Katharina CaperVAICKUTE,Imara Standiford M.D on 01/31/2016 at 10:45 AM  Between 7am to 6pm - Pager - [phone]  After 6pm go to [email or web address] - password [email or web address] [email or web address]  [facility]  Office  [phone]  CC: Primary care physician; [facility]

## 2016-01-31 NOTE — Progress Notes (Signed)
Subjective: Patient stable.  No new complaints.    Objective: Current vital signs: BP 149/85 mmHg  Pulse 95  Temp(Src) 98.2 F (36.8 C) (Oral)  Resp 14  Ht _0  (1.651 m)  Wt 68.04 kg (150 lb)  BMI 24.96 kg/m2  SpO2 96% Vital signs in last 24 hours: Temp:  [97.6 F (36.4 C)-98.3 F (36.8 C)] 98.2 F (36.8 C) (06/24 0725) Pulse Rate:  [92-98] 95 (06/24 0725) Resp:  [14-20] 14 (06/24 0725) BP: (144-156)/(83-93) 149/85 mmHg (06/24 0725) SpO2:  [94 %-97 %] 96 % (06/24 0725)  Intake/Output from previous day: 06/23 0701 - 06/24 0700 In: 600 [P.O.:600] Out: 800 [Urine:800] Intake/Output this shift: Total I/O In: -  Out: 300 [Urine:300] Nutritional status: Diet Heart Room service appropriate?: Yes; Fluid consistency:: Thin  Neurologic Exam: Mental Status: Alert, oriented, thought content appropriate. Speech fluent without evidence of aphasia. Able to follow 3 step commands without difficulty. Cranial Nerves: II: Discs flat bilaterally; Visual fields grossly normal, pupils equal, round, reactive to light and accommodation III,IV, VI: ptosis not present, extra-ocular motions intact bilaterally V,VII: smile symmetric, facial light touch sensation reported decreased bilaterally VIII: hearing normal bilaterally IX,X: gag reflex present XI: bilateral shoulder shrug XII: midline tongue extension Motor: Right :Upper extremity 5/5Left: Upper extremity 5-/5 with no pronator drift. Hand grip symmetric Lower extremity 5-/5Lower extremity 4/5 with decreased effort and no reciprocal movement down of the RLE with attempts to lift the LLE.  Tone and bulk:normal tone throughout; no atrophy noted  Lab Results: Basic Metabolic Panel:  Recent Labs Lab 01/29/16 2037 01/30/16 0118  NA 142 141  K 4.0 3.9  CL 110 110  CO2 22 24  GLUCOSE 195* 186*  BUN 31* 32*  CREATININE  1.23 1.12  CALCIUM 8.4* 8.5*    Liver Function Tests:  Recent Labs Lab 01/29/16 2037 01/31/16 0431  AST 48* 31  ALT 94* 86*  ALKPHOS 103 95  BILITOT 0.5 0.4  PROT 5.4* 5.6*  ALBUMIN 2.5* 2.3*   No results for input(s): LIPASE, AMYLASE in the last 168 hours.  Recent Labs Lab 01/29/16 2112  AMMONIA 10    CBC:  Recent Labs Lab 01/29/16 2112 01/30/16 0118  WBC 7.5 7.1  NEUTROABS 6.6*  --   HGB 10.9* 10.9*  HCT 31.7* 32.6*  MCV 99.2 101.9*  PLT 110* 100*    Cardiac Enzymes:  Recent Labs Lab 01/29/16 2037 01/30/16 0118 01/30/16 0703 01/30/16 1242 01/30/16 1451  CKTOTAL  --   --   --   --  124  TROPONINI 0.06* 0.05* 0.04* 0.06*  --     Lipid Panel: No results for input(s): CHOL, TRIG, HDL, CHOLHDL, VLDL, LDLCALC in the last 168 hours.  CBG:  Recent Labs Lab 01/29/16 2059 01/30/16 1555 01/30/16 2103  GLUCAP 40* 331* 167*    Microbiology: Results for orders placed or performed during the hospital encounter of 01/13/16  CSF culture     Status: None   Collection Time: 01/15/16 12:19 PM  Result Value Ref Range Status   Specimen Description CSF  Final   Special Requests Immunocompromised  Final   Gram Stain NO ORGANISMS SEEN NO WBC SEEN CYTOSPIN SMEAR   Final   Culture   Final    NO GROWTH 3 DAYS Performed at [facility]    Report Status 01/19/2016 FINAL  Final  Culture, fungus without smear ([facility]-Only)     Status: None (Preliminary result)   Collection Time: 01/15/16 12:19 PM  Result Value Ref Range Status   Specimen Description CSF  Final   Special Requests Immunocompromised  Final   Culture   Final    NO FUNGUS ISOLATED AFTER 14 DAYS Performed at [facility]    Report Status PENDING  Incomplete    Coagulation Studies:  Recent Labs  01/30/16 0118  LABPROT 14.5  INR 1.11    Imaging: Ct Head Wo Contrast  01/29/2016  CLINICAL DATA:  Increased weakness and several falls today. EXAM: CT HEAD WITHOUT CONTRAST  TECHNIQUE: Contiguous axial images were obtained from the base of the skull through the vertex without intravenous contrast. COMPARISON:  MRI brain 01/14/2016 FINDINGS: Ventricles, cisterns and other CSF spaces are within normal. There is mild chronic ischemic microvascular disease. There is no mass, mass effect, shift of midline structures or acute hemorrhage. There is no evidence of acute infarction. Non fusion of the anterior arch of C1. Remaining bony structures are within normal. Evidence of previous sinus surgery with fenestration of the medial wall of the maxillary sinuses. IMPRESSION: No acute intracranial findings. Chronic ischemic microvascular disease. Electronically Signed   By: Marin Olp M.D.   On: 01/29/2016 19:30   Ct Cervical Spine Wo Contrast  01/29/2016  CLINICAL DATA:  Increased weakness and several falls today. EXAM: CT HEAD WITHOUT CONTRAST TECHNIQUE: Contiguous axial images were obtained from the base of the skull through the vertex without intravenous contrast. COMPARISON:  MRI brain 01/14/2016 FINDINGS: Ventricles, cisterns and other CSF spaces are within normal. There is mild chronic ischemic microvascular disease. There is no mass, mass effect, shift of midline structures or acute hemorrhage. There is no evidence of acute infarction. Non fusion of the anterior arch of C1. Remaining bony structures are within normal. Evidence of previous sinus surgery with fenestration of the medial wall of the maxillary sinuses. IMPRESSION: No acute intracranial findings. Chronic ischemic microvascular disease. Electronically Signed   By: Marin Olp M.D.   On: 01/29/2016 19:30   Mr Brain Wo Contrast  01/30/2016  CLINICAL DATA:  Weakness.  HIV positive.  Altered mental status. EXAM: MRI HEAD WITHOUT CONTRAST TECHNIQUE: Multiplanar, multiecho pulse sequences of the brain and surrounding structures were obtained without intravenous contrast. COMPARISON:  CT head 01/29/2016 FINDINGS: Image quality  degraded by motion Mild atrophy and mild ventricular enlargement unchanged from prior studies. Negative for acute infarct. Chronic white matter changes are stable since the prior MRI of 01/14/2016. Mild hyperintensity in the pons left greater than right also unchanged Negative for mass or edema Negative for hemorrhage or fluid collection. Paranasal sinuses clear. Prior sinus surgery. Normal orbit and pituitary. IMPRESSION: Atrophy and chronic white matter changes are stable. No superimposed acute abnormality. Electronically Signed   By: Franchot Gallo M.D.   On: 01/30/2016 11:38    Medications:  I have reviewed the patient's current medications. Scheduled: . abacavir-dolutegravir-lamiVUDine  1 tablet Oral QHS  . carvedilol  12.5 mg Oral BID WC  . enoxaparin (LOVENOX) injection  40 mg Subcutaneous QHS  . escitalopram  20 mg Oral Daily  . fluconazole  200 mg Oral Daily  . insulin aspart  0-9 Units Subcutaneous TID WC  . isosorbide mononitrate  30 mg Oral Daily  . methimazole  10 mg Oral Daily  . mirtazapine  30 mg Oral QHS  . mometasone-formoterol  2 puff Inhalation BID  . pantoprazole  40 mg Oral QAC breakfast  . tiotropium  18 mcg Inhalation Daily  . traMADol  50 mg Oral BID  Assessment/Plan: Patient unchanged.  B12 low normal.  ESR remains slightly elevated at 57.  Thyroid studies abnormal and being further investigated.  Methylmalonic acid pending.    Recommendations: 1.  Agree with continued addressing of above issues.  NCV/EMG on an outpatient basis.     LOS: 2 days   Alexis Goodell, MD Neurology [phone] 01/31/2016  8:14 AM

## 2016-02-01 ENCOUNTER — Inpatient Hospital Stay: Payer: Medicare Other

## 2016-02-01 LAB — BASIC METABOLIC PANEL
ANION GAP: 7 (ref 5–15)
BUN: 25 mg/dL — AB (ref 6–20)
CALCIUM: 8.9 mg/dL (ref 8.9–10.3)
CO2: 29 mmol/L (ref 22–32)
Chloride: 104 mmol/L (ref 101–111)
Creatinine, Ser: 0.95 mg/dL (ref 0.61–1.24)
GFR calc Af Amer: >60 mL/min (ref >60)
GLUCOSE: 166 mg/dL — AB (ref 65–99)
Potassium: 4 mmol/L (ref 3.5–5.1)
Sodium: 140 mmol/L (ref 135–145)

## 2016-02-01 LAB — CBC WITH DIFFERENTIAL/PLATELET
BASOS PCT: 0 %
Basophils Absolute: 0 10*3/uL (ref 0–0.1)
EOS ABS: 0 10*3/uL (ref 0–0.7)
Eosinophils Relative: 1 %
HCT: 32.6 % — ABNORMAL LOW (ref 40.0–52.0)
HEMOGLOBIN: 11.6 g/dL — AB (ref 13.0–18.0)
LYMPHS ABS: 0.6 10*3/uL — AB (ref 1.0–3.6)
Lymphocytes Relative: 10 %
MCH: 35.3 pg — ABNORMAL HIGH (ref 26.0–34.0)
MCHC: 35.6 g/dL (ref 32.0–36.0)
MCV: 99.2 fL (ref 80.0–100.0)
Monocytes Absolute: 0.2 10*3/uL (ref 0.2–1.0)
Monocytes Relative: 3 %
NEUTROS PCT: 86 %
Neutro Abs: 5.3 10*3/uL (ref 1.4–6.5)
Platelets: 117 10*3/uL — ABNORMAL LOW (ref 150–440)
RBC: 3.28 MIL/uL — AB (ref 4.40–5.90)
RDW: 13.7 % (ref 11.5–14.5)
WBC: 6.1 10*3/uL (ref 3.8–10.6)

## 2016-02-01 LAB — GLUCOSE, CAPILLARY
Glucose-Capillary: 153 mg/dL — ABNORMAL HIGH (ref 65–99)
Glucose-Capillary: 353 mg/dL — ABNORMAL HIGH (ref 65–99)
Glucose-Capillary: 117 mg/dL — ABNORMAL HIGH (ref 65–99)
Glucose-Capillary: 267 mg/dL — ABNORMAL HIGH (ref 65–99)

## 2016-02-01 LAB — MISC LABCORP TEST (SEND OUT): Labcorp test code: 10314

## 2016-02-01 MED ORDER — IOPAMIDOL (ISOVUE-300) INJECTION 61%
75.0000 mL | Freq: Once | INTRAVENOUS | Status: AC | PRN
  Administered 2016-02-01: 75 mL via INTRAVENOUS

## 2016-02-01 MED ORDER — POTASSIUM CHLORIDE IN NACL 20-0.9 MEQ/L-% IV SOLN
INTRAVENOUS | Status: DC
  Administered 2016-02-01 – 2016-02-02 (×2): via INTRAVENOUS
  Filled 2016-02-01 (×3): qty 1000

## 2016-02-01 NOTE — Progress Notes (Signed)
Pt complained of pain and swelling to Left side of jaw/neck and pain with swallowing, no obvious lesions or redness to throat, mild swelling noted at jawline on left side.

## 2016-02-01 NOTE — Progress Notes (Signed)
[facility] [place] [facility]   PATIENT NAME: Brian Hampton    MRN#:  [id number]  DATE OF BIRTH:  1955-09-11  SUBJECTIVE:  Admits of generalized weakness, also left neck and facial swelling, difficulty to swallow food, mostly solid, no difficulty swallowing liquids.  REVIEW OF SYSTEMS:  CONSTITUTIONAL: No fever,Positive fatigue or weakness.  EYES: No blurred or double vision.  EARS, NOSE, AND THROAT: No tinnitus or ear pain.  Admits of dysphagia RESPIRATORY: No cough, shortness of breath, wheezing or hemoptysis.  CARDIOVASCULAR: No chest pain, orthopnea, edema.  GASTROINTESTINAL: No nausea, vomiting, diarrhea or abdominal pain.  GENITOURINARY: No dysuria, hematuria.  ENDOCRINE: No polyuria, nocturia,  HEMATOLOGY: No anemia, easy bruising or bleeding SKIN: No rash or lesion. MUSCULOSKELETAL: No joint pain or arthritis.   NEUROLOGIC: No tingling, numbness, positive weakness.  PSYCHIATRY: No anxiety or depression.   DRUG ALLERGIES:   Allergies  Allergen Reactions  . Aspirin Anaphylaxis  . Bee Venom Anaphylaxis  . Penicillins Anaphylaxis and Other (See Comments)    Has patient had a PCN reaction causing immediate rash, facial/tongue/throat swelling, SOB or lightheadedness with hypotension: Yes Has patient had a PCN reaction causing severe rash involving mucus membranes or skin necrosis: No Has patient had a PCN reaction that required hospitalization No Has patient had a PCN reaction occurring within the last 10 years: Yes If all of the above answers are "NO", then may proceed with Cephalosporin use.  . Prednisone Anaphylaxis  . Sulfa Antibiotics Anaphylaxis  . Sulfasalazine Anaphylaxis  . Theophyllines Anaphylaxis  . Theophylline Swelling    VITALS:  Blood pressure 132/79, pulse 113, temperature 98.3 F (36.8 C), temperature source Oral, resp. rate 14, height 5\' 5"  (1.651 m), weight 68.04 kg (150 lb), SpO2 93 %.  PHYSICAL EXAMINATION:  VITAL  SIGNS: Filed Vitals:   02/01/16 0730 02/01/16 1118  BP: 154/91 132/79  Pulse: 114 113  Temp: 98.5 F (36.9 C) 98.3 F (36.8 C)  Resp: 14 14   GENERAL:59 y.o.male currently in no acute distress.  HEAD: Normocephalic, atraumatic. Mild swelling of left jaw area and jaw angle, but no nodularity or induration, no significant tenderness on palpation, no fluctuation EYES: Pupils equal, round, reactive to light. Extraocular muscles intact. No scleral icterus.  MOUTH: Moist mucosal membrane. Dentition intact. No abscess noted.  EAR, NOSE, THROAT: Clear without exudates. No external lesions.  NECK: Supple. No thyromegaly. No nodules. No JVD.  PULMONARY: Clear to ascultation, without wheeze rails or rhonci. No use of accessory muscles, Good respiratory effort. good air entry bilaterally CHEST: Nontender to palpation.  CARDIOVASCULAR: S1 and S2. Regular rate and rhythm. No murmurs, rubs, or gallops. No edema. Pedal pulses 2+ bilaterally.  GASTROINTESTINAL: Soft, nontender, nondistended. No masses. Positive bowel sounds. No hepatosplenomegaly.  MUSCULOSKELETAL: No swelling, clubbing, or edema. Range of motion full in all extremities.  NEUROLOGIC: Cranial nerves II through XII are intact. Motor - LUE 5-/5 RUE 5/5 LLE 4-/3260m RLE 5-/5   Sensation decreased left. SKIN: No ulceration, lesions, rashes, or cyanosis. Skin warm and dry. Turgor intact.  PSYCHIATRIC: Mood, affect within normal limits. The patient is awake, alert and oriented x 3. Insight, judgment intact.      LABORATORY PANEL:   CBC  Recent Labs Lab 02/01/16 0419  WBC 6.1  HGB 11.6*  HCT 32.6*  PLT 117*   ------------------------------------------------------------------------------------------------------------------  Chemistries   Recent Labs Lab 01/31/16 0431 02/01/16 0419  NA  --  140  K  --  4.0  CL  --  104  CO2  --  29  GLUCOSE  --  166*  BUN  --  25*  CREATININE  --  0.95  CALCIUM  --  8.9  AST 31  --    ALT 86*  --   ALKPHOS 95  --   BILITOT 0.4  --    ------------------------------------------------------------------------------------------------------------------  Cardiac Enzymes  Recent Labs Lab 01/30/16 1242  TROPONINI 0.06*   ------------------------------------------------------------------------------------------------------------------  RADIOLOGY:  Ct Soft Tissue Neck W Contrast  02/01/2016  CLINICAL DATA:  Increased swelling/ mass involving the upper neck/ jaw area. Dysphagia. EXAM: CT NECK WITH CONTRAST TECHNIQUE: Multidetector CT imaging of the neck was performed using the standard protocol following the bolus administration of intravenous contrast. CONTRAST:  75mL ISOVUE-300 IOPAMIDOL (ISOVUE-300) INJECTION 61% COMPARISON:  Head and cervical spine CT 01/29/2016. Cervical spine MRI 01/14/2016. FINDINGS: Pharynx and larynx: Mildly limited evaluation due to motion artifact without mass or other significant abnormality identified. Salivary glands: Fatty infiltration of the submandibular and parotid glands bilaterally. No mass, calculi, or submandibular space inflammation seen. A vitamin-E marker placed indicate the area of clinical concern is in the left submandibular region without mass or other abnormality identified deep to it. Thyroid: Possible small lower pole thyroid nodules, with evaluation limited by motion artifact. Lymph nodes: No enlarged lymph nodes are identified in the neck. Vascular: Major vascular structures of the neck appear patent. There is prominent noncalcified plaque involving the right carotid bifurcation with at most mild proximal ICA narrowing. Limited intracranial: Partially visualized cerebral atrophy. Visualized orbits: Mild bilateral exophthalmos. Globes appear intact without orbital mass identified. Mastoids and visualized paranasal sinuses: Prior sinus surgery. Small volume right sphenoid sinus fluid. Clear mastoid air cells. Skeleton: Unfused anterior arch  of C1 with mild degenerative change as well as absence of the left half of the posterior C1 ring. Upper chest: Clear lung apices. IMPRESSION: No mass or other abnormality identified in the area of clinical concern. Electronically Signed   By: Sebastian Ache M.D.   On: 02/01/2016 11:07   Ct Abdomen Pelvis W Contrast  01/31/2016  CLINICAL DATA:  Intermittent lower abdominal pain for 3 weeks EXAM: CT ABDOMEN AND PELVIS WITH CONTRAST TECHNIQUE: Multidetector CT imaging of the abdomen and pelvis was performed using the standard protocol following bolus administration of intravenous contrast. CONTRAST:  ISOVUE-300 IOPAMIDOL (ISOVUE-300) INJECTION 61% COMPARISON:  CT scan 10/02/2011 FINDINGS: Lower chest:  The lung bases are unremarkable. Hepatobiliary: Enhanced liver is unremarkable. No calcified gallstones are noted within gallbladder. Pancreas: Enhanced pancreas is unremarkable. Spleen: Enhanced spleen is unremarkable. Adrenals/Urinary Tract: No adrenal gland mass. Enhanced kidneys are symmetrical in size. Mild lobulated renal contour. No hydronephrosis or hydroureter. Delayed renal images shows bilateral renal symmetrical excretion. Bilateral visualized proximal ureter is unremarkable. Moderate distended urinary bladder. No bladder filling defects are noted. Bilateral distal ureter is unremarkable. Stomach/Bowel: There is no gastric outlet obstruction. No thickened or dilated small bowel loops. There is no evidence of small bowel obstruction. No pericecal inflammation. The terminal ileum is unremarkable. Normal appendix partially visualized in sagittal image 30. Scattered diverticula are noted descending colon. Moderate stool noted within sigmoid colon. There is redundant sigmoid colon. Abundant stool noted within distal sigmoid colon and rectum. The rectum measures 6.4 cm in diameter suspicious for mild fecal impaction. Vascular/Lymphatic: No aortic aneurysm. No retroperitoneal or mesenteric adenopathy.  Reproductive: Prostate gland and seminal vesicles are unremarkable. Other: No ascites or free air. There is tiny umbilical hernia containing omental  fat without evidence of acute complication. Small urachal remnant noted anterior superior aspect of urinary bladder. Musculoskeletal: No destructive bony lesions are noted. Sagittal images of the spine shows degenerative changes thoracic and lumbar spine. IMPRESSION: 1. There is no evidence of acute inflammatory process within abdomen. 2. No hydronephrosis or hydroureter. 3. No pericecal inflammation.  Normal appendix. 4. No small bowel obstruction. 5. Moderate stool noted within redundant sigmoid colon. Abundant stool noted in distal sigmoid colon and rectum. The rectum is distended with stool up to 6.4 cm. Mild fecal impaction cannot be excluded. 6. Degenerative changes thoracolumbar spine 7. Moderate distended urinary bladder. No bladder filling defects are noted. Electronically Signed   By: Natasha MeadLiviu  Pop M.D.   On: 01/31/2016 15:14    EKG:   Orders placed or performed in visit on 01/29/16  . EKG 12-Lead  . EKG 12-Lead  . EKG 12-Lead    ASSESSMENT AND PLAN:   Brian Hampton is a 60 y.o. male presenting with Weakness and Fall . Admitted 01/29/2016 : Day #: 3 days   1. Weakness left >> right MRI brain normal Progressive over 2 weeks MRI brain and spine normal Seems to have hyperthyroidism which could be contributing. Discussed with neurology and Endocrine Dr. Tedd SiasSolum On Friday. Continue physical therapy, likely discharged to skilled nursing facility on Monday.  * Hyperthyroidism Repeat Free t4 on Monday, discontinue methimazole. If free T4 is normal  *. HIV On Meds, continue Diflucan as well. Patient complains of dysphagia, getting barium swallowing study done tomorrow to rule out dysmotility, although suspicion for candidal infection is low due to antifungal therapy.   *. Essential hypertension, Coreg  *. GERD without esophagitis PPI  therapy  *Dysphagia. We are getting barium swallowing, speech evaluation, unlikely candidal infection, supportive therapy and dysphagia 3 diet *Steroid-induced myopathy, now off Genvoya, on Triumeq   All the records are reviewed and case discussed with Care Management/Social Workerr. Management plans discussed with the patient, family and they are in agreement.  CODE STATUS: full TOTAL TIME TAKING CARE OF THIS PATIENT: 35 minutes.   POSSIBLE D/C IN 1-2DAYS, DEPENDING ON CLINICAL CONDITION.  Will need SNF at discharge   [facility] Center[facility],Yarenis Cerino M.D on 02/01/2016 at 2:45 PM  Between 7am to 6pm - Pager - [phone]  After 6pm: House Pager: - [phone]  [facility]  Office  [phone]  CC: Primary care physician; [facility]

## 2016-02-01 NOTE — Care Management Note (Addendum)
Case Management Note  Patient Details  Name: Brian Hampton MRN: [id number] Date of Birth: [birth year]  Subjective/Objective:        [facility]-SW is coordinating [name]'s discharge to [facility]. Due to his numerous medical issues, [name] continues to await medical clearance.            Action/Plan:   Expected Discharge Date:                  Expected Discharge Plan:     In-House Referral:     Discharge planning Services     Post Acute Care Choice:    Choice offered to:     DME Arranged:    DME Agency:     HH Arranged:    HH Agency:     Status of Service:     If discussed at [employer] Length of Stay Meetings, dates discussed:    Additional Comments:  Kortnee Bas A, RN 02/01/2016, 3:28 PM

## 2016-02-02 ENCOUNTER — Inpatient Hospital Stay: Payer: Medicare Other

## 2016-02-02 DIAGNOSIS — R1310 Dysphagia, unspecified: Secondary | ICD-10-CM | POA: Diagnosis not present

## 2016-02-02 DIAGNOSIS — M6282 Rhabdomyolysis: Secondary | ICD-10-CM | POA: Diagnosis not present

## 2016-02-02 DIAGNOSIS — M79651 Pain in right thigh: Secondary | ICD-10-CM | POA: Diagnosis not present

## 2016-02-02 DIAGNOSIS — Z79899 Other long term (current) drug therapy: Secondary | ICD-10-CM | POA: Diagnosis not present

## 2016-02-02 DIAGNOSIS — M79605 Pain in left leg: Secondary | ICD-10-CM | POA: Diagnosis not present

## 2016-02-02 DIAGNOSIS — Z9181 History of falling: Secondary | ICD-10-CM | POA: Diagnosis not present

## 2016-02-02 DIAGNOSIS — R296 Repeated falls: Secondary | ICD-10-CM | POA: Diagnosis not present

## 2016-02-02 DIAGNOSIS — I82811 Embolism and thrombosis of superficial veins of right lower extremities: Secondary | ICD-10-CM | POA: Diagnosis not present

## 2016-02-02 DIAGNOSIS — I213 ST elevation (STEMI) myocardial infarction of unspecified site: Secondary | ICD-10-CM | POA: Diagnosis not present

## 2016-02-02 DIAGNOSIS — Z743 Need for continuous supervision: Secondary | ICD-10-CM | POA: Diagnosis not present

## 2016-02-02 DIAGNOSIS — R41841 Cognitive communication deficit: Secondary | ICD-10-CM | POA: Diagnosis not present

## 2016-02-02 DIAGNOSIS — I739 Peripheral vascular disease, unspecified: Secondary | ICD-10-CM | POA: Diagnosis not present

## 2016-02-02 DIAGNOSIS — J449 Chronic obstructive pulmonary disease, unspecified: Secondary | ICD-10-CM | POA: Diagnosis present

## 2016-02-02 DIAGNOSIS — R1312 Dysphagia, oropharyngeal phase: Secondary | ICD-10-CM | POA: Diagnosis not present

## 2016-02-02 DIAGNOSIS — R6889 Other general symptoms and signs: Secondary | ICD-10-CM | POA: Diagnosis not present

## 2016-02-02 DIAGNOSIS — E876 Hypokalemia: Secondary | ICD-10-CM | POA: Diagnosis present

## 2016-02-02 DIAGNOSIS — K219 Gastro-esophageal reflux disease without esophagitis: Secondary | ICD-10-CM | POA: Diagnosis not present

## 2016-02-02 DIAGNOSIS — T380X1A Poisoning by glucocorticoids and synthetic analogues, accidental (unintentional), initial encounter: Secondary | ICD-10-CM | POA: Diagnosis not present

## 2016-02-02 DIAGNOSIS — G20 Parkinson's disease: Secondary | ICD-10-CM | POA: Diagnosis not present

## 2016-02-02 DIAGNOSIS — R079 Chest pain, unspecified: Secondary | ICD-10-CM | POA: Diagnosis not present

## 2016-02-02 DIAGNOSIS — Z741 Need for assistance with personal care: Secondary | ICD-10-CM | POA: Diagnosis not present

## 2016-02-02 DIAGNOSIS — K589 Irritable bowel syndrome without diarrhea: Secondary | ICD-10-CM | POA: Diagnosis not present

## 2016-02-02 DIAGNOSIS — I80291 Phlebitis and thrombophlebitis of other deep vessels of right lower extremity: Secondary | ICD-10-CM | POA: Diagnosis not present

## 2016-02-02 DIAGNOSIS — F419 Anxiety disorder, unspecified: Secondary | ICD-10-CM | POA: Diagnosis not present

## 2016-02-02 DIAGNOSIS — I2609 Other pulmonary embolism with acute cor pulmonale: Secondary | ICD-10-CM | POA: Diagnosis not present

## 2016-02-02 DIAGNOSIS — I2510 Atherosclerotic heart disease of native coronary artery without angina pectoris: Secondary | ICD-10-CM | POA: Diagnosis present

## 2016-02-02 DIAGNOSIS — I10 Essential (primary) hypertension: Secondary | ICD-10-CM | POA: Diagnosis present

## 2016-02-02 DIAGNOSIS — R109 Unspecified abdominal pain: Secondary | ICD-10-CM | POA: Diagnosis not present

## 2016-02-02 DIAGNOSIS — Z886 Allergy status to analgesic agent status: Secondary | ICD-10-CM | POA: Diagnosis not present

## 2016-02-02 DIAGNOSIS — Z880 Allergy status to penicillin: Secondary | ICD-10-CM | POA: Diagnosis not present

## 2016-02-02 DIAGNOSIS — I80209 Phlebitis and thrombophlebitis of unspecified deep vessels of unspecified lower extremity: Secondary | ICD-10-CM | POA: Diagnosis not present

## 2016-02-02 DIAGNOSIS — I82502 Chronic embolism and thrombosis of unspecified deep veins of left lower extremity: Secondary | ICD-10-CM | POA: Diagnosis not present

## 2016-02-02 DIAGNOSIS — Z87891 Personal history of nicotine dependence: Secondary | ICD-10-CM | POA: Diagnosis not present

## 2016-02-02 DIAGNOSIS — F418 Other specified anxiety disorders: Secondary | ICD-10-CM | POA: Diagnosis not present

## 2016-02-02 DIAGNOSIS — Z888 Allergy status to other drugs, medicaments and biological substances status: Secondary | ICD-10-CM | POA: Diagnosis not present

## 2016-02-02 DIAGNOSIS — G4700 Insomnia, unspecified: Secondary | ICD-10-CM | POA: Diagnosis not present

## 2016-02-02 DIAGNOSIS — Z7901 Long term (current) use of anticoagulants: Secondary | ICD-10-CM | POA: Diagnosis not present

## 2016-02-02 DIAGNOSIS — E0580 Other thyrotoxicosis without thyrotoxic crisis or storm: Secondary | ICD-10-CM | POA: Diagnosis not present

## 2016-02-02 DIAGNOSIS — Z7401 Bed confinement status: Secondary | ICD-10-CM | POA: Diagnosis not present

## 2016-02-02 DIAGNOSIS — I82401 Acute embolism and thrombosis of unspecified deep veins of right lower extremity: Secondary | ICD-10-CM | POA: Diagnosis not present

## 2016-02-02 DIAGNOSIS — E039 Hypothyroidism, unspecified: Secondary | ICD-10-CM | POA: Diagnosis not present

## 2016-02-02 DIAGNOSIS — R29898 Other symptoms and signs involving the musculoskeletal system: Secondary | ICD-10-CM | POA: Diagnosis not present

## 2016-02-02 DIAGNOSIS — G720 Drug-induced myopathy: Secondary | ICD-10-CM | POA: Diagnosis not present

## 2016-02-02 DIAGNOSIS — R531 Weakness: Secondary | ICD-10-CM | POA: Diagnosis not present

## 2016-02-02 DIAGNOSIS — Z91030 Bee allergy status: Secondary | ICD-10-CM | POA: Diagnosis not present

## 2016-02-02 DIAGNOSIS — M6259 Muscle wasting and atrophy, not elsewhere classified, multiple sites: Secondary | ICD-10-CM | POA: Diagnosis not present

## 2016-02-02 DIAGNOSIS — R609 Edema, unspecified: Secondary | ICD-10-CM | POA: Diagnosis not present

## 2016-02-02 DIAGNOSIS — M6281 Muscle weakness (generalized): Secondary | ICD-10-CM | POA: Diagnosis not present

## 2016-02-02 DIAGNOSIS — I2699 Other pulmonary embolism without acute cor pulmonale: Secondary | ICD-10-CM | POA: Diagnosis not present

## 2016-02-02 DIAGNOSIS — I252 Old myocardial infarction: Secondary | ICD-10-CM | POA: Diagnosis not present

## 2016-02-02 DIAGNOSIS — R221 Localized swelling, mass and lump, neck: Secondary | ICD-10-CM | POA: Diagnosis not present

## 2016-02-02 DIAGNOSIS — Z048 Encounter for examination and observation for other specified reasons: Secondary | ICD-10-CM | POA: Diagnosis not present

## 2016-02-02 DIAGNOSIS — Z8249 Family history of ischemic heart disease and other diseases of the circulatory system: Secondary | ICD-10-CM | POA: Diagnosis not present

## 2016-02-02 DIAGNOSIS — I82491 Acute embolism and thrombosis of other specified deep vein of right lower extremity: Secondary | ICD-10-CM | POA: Diagnosis not present

## 2016-02-02 DIAGNOSIS — E119 Type 2 diabetes mellitus without complications: Secondary | ICD-10-CM | POA: Diagnosis not present

## 2016-02-02 DIAGNOSIS — B20 Human immunodeficiency virus [HIV] disease: Secondary | ICD-10-CM | POA: Diagnosis not present

## 2016-02-02 DIAGNOSIS — I82501 Chronic embolism and thrombosis of unspecified deep veins of right lower extremity: Secondary | ICD-10-CM | POA: Diagnosis present

## 2016-02-02 DIAGNOSIS — M79604 Pain in right leg: Secondary | ICD-10-CM | POA: Diagnosis not present

## 2016-02-02 DIAGNOSIS — F339 Major depressive disorder, recurrent, unspecified: Secondary | ICD-10-CM | POA: Diagnosis not present

## 2016-02-02 DIAGNOSIS — Z7951 Long term (current) use of inhaled steroids: Secondary | ICD-10-CM | POA: Diagnosis not present

## 2016-02-02 DIAGNOSIS — J45909 Unspecified asthma, uncomplicated: Secondary | ICD-10-CM | POA: Diagnosis not present

## 2016-02-02 DIAGNOSIS — E785 Hyperlipidemia, unspecified: Secondary | ICD-10-CM | POA: Diagnosis not present

## 2016-02-02 DIAGNOSIS — Z882 Allergy status to sulfonamides status: Secondary | ICD-10-CM | POA: Diagnosis not present

## 2016-02-02 DIAGNOSIS — Z21 Asymptomatic human immunodeficiency virus [HIV] infection status: Secondary | ICD-10-CM | POA: Diagnosis not present

## 2016-02-02 DIAGNOSIS — N170 Acute kidney failure with tubular necrosis: Secondary | ICD-10-CM | POA: Diagnosis not present

## 2016-02-02 DIAGNOSIS — Z791 Long term (current) use of non-steroidal anti-inflammatories (NSAID): Secondary | ICD-10-CM | POA: Diagnosis not present

## 2016-02-02 DIAGNOSIS — R0782 Intercostal pain: Secondary | ICD-10-CM | POA: Diagnosis not present

## 2016-02-02 DIAGNOSIS — M79661 Pain in right lower leg: Secondary | ICD-10-CM | POA: Diagnosis present

## 2016-02-02 DIAGNOSIS — E0590 Thyrotoxicosis, unspecified without thyrotoxic crisis or storm: Secondary | ICD-10-CM | POA: Diagnosis not present

## 2016-02-02 LAB — T4, FREE
FREE T4: 2.62 ng/dL — AB (ref 0.61–1.12)
Free T4: 2.97 ng/dL — ABNORMAL HIGH (ref 0.61–1.12)

## 2016-02-02 LAB — GLUCOSE, CAPILLARY
Glucose-Capillary: 160 mg/dL — ABNORMAL HIGH (ref 65–99)
Glucose-Capillary: 150 mg/dL — ABNORMAL HIGH (ref 65–99)
Glucose-Capillary: 210 mg/dL — ABNORMAL HIGH (ref 65–99)
Glucose-Capillary: 160 mg/dL — ABNORMAL HIGH (ref 65–99)

## 2016-02-02 LAB — TSH: TSH: 0.062 u[IU]/mL — AB (ref 0.350–4.500)

## 2016-02-02 MED ORDER — METHIMAZOLE 10 MG PO TABS: 10.0000 mg | ORAL_TABLET | Freq: Every day | ORAL | Status: DC

## 2016-02-02 NOTE — Evaluation (Signed)
Clinical/Bedside Swallow Evaluation Patient Details  Name: Brian Hampton MRN: [id number] Date of Birth: 04/20/1956  Today's Date: 02/02/2016 Time: SLP Start Time (ACUTE ONLY): 1005 SLP Stop Time (ACUTE ONLY): 1039 SLP Time Calculation (min) (ACUTE ONLY): 34 min  Past Medical History:  Past Medical History  Diagnosis Date  . Asthma   . COPD (chronic obstructive pulmonary disease) (HCC)   . Coronary artery disease   . Lung mass   . Anemia   . Emphysema/COPD (HCC)   . Myocardial infarction (HCC)     in 2000  . Type 2 diabetes mellitus (HCC)   . HIV (human immunodeficiency virus infection) (HCC)   . Collagen vascular disease (HCC)   . Hypertension    Past Surgical History: History reviewed. No pertinent past surgical history. HPI:      Assessment / Plan / Recommendation Clinical Impression  pt presents with a mild oral pharyngeal dysphagia characterized by pt complaints of food hurting his throat. pt did not have any ssx aspiration noted with any textures or liquids, however would prefer softer tesxtures. ST educated on puree textures and pt requested to have puree. MD consulted and stated no physiological reason but will consult ENT. pt states he sees ENT already.  Diet downgraded per pt request.     Aspiration Risk  Mild aspiration risk    Diet Recommendation Dysphagia 1 (Puree);Thin liquid   Liquid Administration via: Cup;Straw Medication Administration: Crushed with puree Supervision: Patient able to self feed Compensations: Slow rate;Small sips/bites;Follow solids with liquid Postural Changes: Seated upright at 90 degrees    Other  Recommendations Recommended Consults: Consider ENT evaluation Oral Care Recommendations: Patient independent with oral care   Follow up Recommendations       Frequency and Duration min 3x week  1 week       Prognosis Prognosis for Safe Diet Advancement: Good Barriers/Prognosis Comment: Depending on ENT findings.       Swallow Study    General Date of Onset: 02/01/16 Type of Study: Bedside Swallow Evaluation Diet Prior to this Study: Dysphagia 3 (soft);Thin liquids Temperature Spikes Noted: N/A Respiratory Status: Room air History of Recent Intubation: No Behavior/Cognition: Alert;Cooperative;Pleasant mood Oral Cavity Assessment: Within Functional Limits Oral Care Completed by SLP: No Oral Cavity - Dentition: Adequate natural dentition Vision: Functional for self-feeding Self-Feeding Abilities: Able to feed self Patient Positioning: Upright in bed Baseline Vocal Quality: Hoarse Volitional Cough: Strong Volitional Swallow: Able to elicit    Oral/Motor/Sensory Function Overall Oral Motor/Sensory Function: Within functional limits   Ice Chips Ice chips: Within functional limits Presentation: Spoon;Self Fed   Thin Liquid Thin Liquid: Within functional limits Presentation: Cup;Straw;Self Fed    Nectar Thick Nectar Thick Liquid: Not tested   Honey Thick Honey Thick Liquid: Not tested   Puree Puree: Within functional limits Presentation: Self Fed;Spoon   Solid   GO   Solid: Impaired Presentation: Self Fed;Spoon Oral Phase Functional Implications: Impaired mastication;Prolonged oral transit Other Comments: pt states that the food hurts his throat and feels "scratchy". prefers softer textures, requested puree.        Meredith PelStacie Harris Sauber 02/02/2016,12:29 PM

## 2016-02-02 NOTE — Progress Notes (Signed)
Pt taken by EMS per stretcher to [facility] in improved condition and not in any distress. Report given by AM shift RN Caryl AspKellie to [facility] staff.

## 2016-02-02 NOTE — Progress Notes (Signed)
Dr. Patrina LeveringVacikute notified that free t4 is 2.97 via telephone.

## 2016-02-02 NOTE — Care Management Important Message (Signed)
Important Message  Patient Details  Name: Brian Hampton MRN: [id number] Date of Birth: Mar 08, 1956   Medicare Important Message Given:  Yes    Collie SiadAngela Chace Bisch, RN 02/02/2016, 8:16 AM

## 2016-02-02 NOTE — Progress Notes (Signed)
Patient is alert and oriented, remains hoarse, consulted with speech and will continue on dysphagia diet at this time, fair appetite, generalized weakness, up to bsc with one person assist. Sister visiting at bedside and chaplain consulted to make sister POA per patient request. Pt is d/ c to [facility] for rehab, pt to be transported via EMS, pt should f/u with endocrinology and pcp. Sister updated on plan of care. Report [facility] to [facility] at 16:00, patient still in room at time of shift exchange.

## 2016-02-02 NOTE — Progress Notes (Signed)
ENDOCRINOLOGY FOLLOWUP  REFERRING PHYSICIAN: [name], MD CONSULTING PHYSICIAN:  A. Wendall MolaMelissa Solum, MD  Chief complaint Hyperthyroidism  History of present illness Brian Hampton is a 60 y.o. with history of COPD and HIV+ admitted with severe lower extremity and left upper extremity weakness.  Initial onset of weakness was in mid May 2017 with gradual worsening. He had been treated with nhaled steroid (dulera) and taking the antiviral Genvoya, starting in 07/2015. 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 Last week, Genvoya was stopped and replaced with Triumeq.   Second issue is hyperthyroidism, with initial low TSH level of 0.92 on 01/15/16. No known contrast exposure since 06/2015. No fever or neck pain. No use of lithium or amiodarone.  He was started on methimazole 3 days ago. Tolerating methimazole. On 6/24, thyrotropin receptor antibody was not elevated. T3 and TSI levels are pending. Repeat TSH level on 02/01/16 was 0.062 with elevated free T4 of 2.62 ng/dl.    Today he has hoarseness. States this has been on/off for months. Sister at bedside has several questions and expresses her concern.  Medical history Past Medical History  Diagnosis Date  . Asthma   . COPD (chronic obstructive pulmonary disease) (HCC)   . Coronary artery disease   . Lung mass   . Anemia   . Emphysema/COPD (HCC)   . Myocardial infarction (HCC)     in 2000  . Type 2 diabetes mellitus (HCC)   . HIV (human immunodeficiency virus infection) (HCC)   . Collagen vascular disease (HCC)   . Hypertension     Surgical history History reviewed. No pertinent past surgical history.  Medications  Current facility-administered medications:  .  0.9 % NaCl with KCl 20 mEq/ L  infusion, , Intravenous, Continuous, Katharina Caperima Vaickute, MD, Last Rate: 50 mL/hr at 02/02/16 0836 .  abacavir-dolutegravir-lamiVUDine (TRIUMEQ)  600-50-300 MG per tablet 1 tablet, 1 tablet, Oral, QHS, Debby Crosley, MD, 1 tablet at 02/01/16 2314 .  acetaminophen (TYLENOL) tablet 650 mg, 650 mg, Oral, Q6H PRN, 650 mg at 02/02/16 1320 **OR** acetaminophen (TYLENOL) suppository 650 mg, 650 mg, Rectal, Q6H PRN, Debby Crosley, MD .  carvedilol (COREG) tablet 12.5 mg, 12.5 mg, Oral, BID WC, Debby Crosley, MD, 12.5 mg at 02/02/16 0931 .  clonazePAM (KLONOPIN) tablet 0.5 mg, 0.5 mg, Oral, BID PRN, Gery Prayebby Crosley, MD, 0.5 mg at 01/30/16 0144 .  enoxaparin (LOVENOX) injection 40 mg, 40 mg, Subcutaneous, QHS, Debby Crosley, MD, 40 mg at 02/01/16 2202 .  escitalopram (LEXAPRO) tablet 20 mg, 20 mg, Oral, Daily, Debby Crosley, MD, 20 mg at 02/02/16 0931 .  fluconazole (DIFLUCAN) tablet 200 mg, 200 mg, Oral, Daily, Debby Crosley, MD, 200 mg at 02/02/16 0931 .  insulin aspart (novoLOG) injection 0-9 Units, 0-9 Units, Subcutaneous, TID WC, [name], MD, 1 Units at 02/02/16 1200 .  isosorbide mononitrate (IMDUR) 24 hr tablet 30 mg, 30 mg, Oral, Daily, Debby Crosley, MD, 30 mg at 02/02/16 0931 .  methimazole (TAPAZOLE) tablet 10 mg, 10 mg, Oral, Daily, Raj JanusAnna M Solum, MD, 10 mg at 02/02/16 0931 .  metoprolol (LOPRESSOR) injection 5 mg, 5 mg, Intravenous, Q4H PRN, Debby Crosley, MD .  mirtazapine (REMERON) tablet 30 mg, 30 mg, Oral, QHS, Debby Crosley, MD, 30 mg at 02/01/16 2202 .  mometasone-formoterol (DULERA) 200-5 MCG/ACT inhaler 2 puff, 2 puff, Inhalation, BID, Gery Prayebby Crosley, MD, 2 puff at 02/02/16 0932 .  nitroGLYCERIN (NITROSTAT)  SL tablet 0.4 mg, 0.4 mg, Sublingual, Q5 min PRN, Debby Crosley, MD .  pantoprazole (PROTONIX) EC tablet 40 mg, 40 mg, Oral, QAC breakfast, Debby Crosley, MD, 40 mg at 02/02/16 0931 .  promethazine (PHENERGAN) tablet 12.5 mg, 12.5 mg, Oral, Q6H PRN, Debby Crosley, MD .  senna-docusate (Senokot-S) tablet 1 tablet, 1 tablet, Oral, QHS PRN, Debby Crosley, MD .  tiotropium (SPIRIVA) inhalation capsule 18 mcg, 18 mcg, Inhalation,  Daily, Debby Crosley, MD, 18 mcg at 02/02/16 0932 .  traMADol (ULTRAM) tablet 50 mg, 50 mg, Oral, BID, Debby Crosley, MD, 50 mg at 02/02/16 0931 .  zolpidem (AMBIEN) tablet 5 mg, 5 mg, Oral, QHS PRN, Gery Prayebby Crosley, MD, 5 mg at 02/01/16 0007    Social History  Substance Use Topics  . Smoking status: Former Games developermoker  . Smokeless tobacco: Never Used  . Alcohol Use: No    Family History  Problem Relation Age of Onset  . CAD      Review of systems GENERAL:  Patient has fatigue. Patient denies fever.Marland Kitchen. NECK:  Patient has noted gradual onset neck swelling - unchanged in days. No neck pain. No difficulty with swallowing. CARDIAC:  Patient denies chest pain or palpitations. RESPIRATORY:  Patient denies SOB.  Patient denies a cough.    Exam BP 129/77 mmHg  Pulse 110  Temp(Src) 98.3 F (36.8 C) (Oral)  Resp 22  Ht 5\' 5"  (1.651 m)  Wt 68.04 kg (150 lb)  BMI 24.96 kg/m2  SpO2 94% GEN: White male, resting, in NAD HEENT: +moon facies. +ruddy complexion. OP clear.  NECK: supple, trachea midline, thyroid not enlarged and no palpable nodules. No neck tenderness. RESPIRATORY: clear bilaterally, no wheeze, good inspiratory effort. CV: No carotid bruits, RRR. MUSCULOSKELETAL: no clubbing, no tremor of outstretched hands. ABD: +distended. Soft, NT. EXT: no edema SKIN: No striae on abdomen. +Multiple ecchymoses on arms. No dermatopathy or rash or acanthosis nigricans.  LYMPH: no submandibular or supraclavicular LAD NEURO: PERRL, 2+ knee DTRs equal and symmetric PSYC: alert and oriented, good insight  Assessment 1. Hyperthyroidism. Causes may include silent thyroiditis, Grave's Disease, toxic MNG or toxic thyroid adenoma 2. Steroid-induced myopathy.   3. COPD 4. HIV+  Plan * TRAb was not elevated, suggesting cause may be thyroiditis. Awaiting TSI level. * Continue methimazole for now.  * Will plan to obtain repeat TSH and free T4 in 7-10 days. Will stop methimazole if thyroid labs  normalized and thyroid antibodies neg, which would suggest thyroiditis.  * If diagnosis not clear with lab evaluation, could consider thyroid uptake/scan as out-patient.  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 Labs for cortisol at this time will not be helpful as the Brian LuisGenvoya has been stopped. Would expect gradual improvement in upcoming weeks if this is steroid-induced myopathy. * Patient to follow up with me in 7-10 days as out-patient.   Will sign off as patient has orders for discharge today.

## 2016-02-02 NOTE — Progress Notes (Signed)
Patient is medically stable for D/C to [facility]. Per [facility] admissions coordinator [facility] will go to room 42-A. RN will call report and arrange EMS for transport. Clinical Child psychotherapistocial Worker (CSW) sent D/C Summary, FL2 and D/C Packet to [facility] via [facility]. Patient is aware of above. CSW contacted patient's sister Junious DresserConnie and made her aware of above. Please reconsult if future social work needs arise. CSW signing off.   Jetta LoutBailey Morgan, LCSW [phone]

## 2016-02-02 NOTE — Progress Notes (Signed)
   02/02/16 1400  Clinical Encounter Type  Visited With Patient  Visit Type Initial  Consult/Referral To Chaplain  Patient requested Advanced Directive education in which I provided. I instructed patient to give us a call when he has completed the paperwork.   Fisher ScientificChaplain Jo Cerone [phone]:[phone]

## 2016-02-02 NOTE — Progress Notes (Signed)
   02/02/16 0915  Clinical Encounter Type  Visited With Patient  Visit Type Initial  Consult/Referral To Chaplain  pt was having trouble speaking and was short of breathe.  He was anxious to receive his test results.  No other immediate clerical needs.

## 2016-02-02 NOTE — Progress Notes (Signed)
Occupational Therapy Treatment Patient Details Name: Brian Hampton MRN: [id number] DOB: 1956-03-23 Today's Date: 02/02/2016    History of present illness Pt is a 60 y/o male that presents with progressive onset o Left sided weakness. He is a well controlled longstanding HIV patient admitted 6/6 with falls and bil LE weakness and pain for 2 weeks. On admit was afebrile, wbc nml, CMP nml x low albumin, mild anemia, low B12. CK nml.  Hx of COPD, asthma, DM, renal disorder, lung mass and MI. He lives at a boarding house. MRI negative for acute changes   OT comments  Pt. Is making progress overall with BUE and hand use during ADL tasks. Pt. Continues to present with limited strength, weakness, limited activity tolerance which continue to hinder his ability to complete ADl tasks. Pt. Could benefit from skilled OT services for ADL training, UE there. Ex., and pt. Education about energy conservation in order to improve ADL and IADL functioning.   Follow Up Recommendations  SNF    Equipment Recommendations       Recommendations for Other Services      Precautions / Restrictions         Mobility Bed Mobility                  Transfers                      Balance                                   ADL   Eating/Feeding: Set up   Grooming: Set up;Minimal assistance                                        Vision                     Perception     Praxis      Cognition   Behavior During Therapy: WFL for tasks assessed/performed Overall Cognitive Status: Within Functional Limits for tasks assessed                       Extremity/Trunk Assessment               Exercises     Shoulder Instructions       General Comments      Pertinent Vitals/ Pain       Pain Assessment: No/denies pain  Home Living                                          Prior Functioning/Environment               Frequency Min 1X/week     Progress Toward Goals  OT Goals(current goals can now be found in the care plan section)  Progress towards OT goals: Progressing toward goals  Acute Rehab OT Goals Patient Stated Goal: To return to prior level of function OT Goal Formulation: With patient Time For Goal Achievement: 02/13/16 Potential to Achieve Goals: Good  Plan      Co-evaluation                 End of  Session     Activity Tolerance Patient tolerated treatment well   Patient Left in bed;with call bell/phone within reach;with bed alarm set   Nurse Communication      Functional Assessment Tool Used: Clinical judgment based on pt. current functional status. Functional Limitation: Self care Self Care Current Status [phone](G8987): At least 20 percent but less than 40 percent impaired, limited or restricted Self Care Goal Status ([id number](G8988): At least 1 percent but less than 20 percent impaired, limited or restricted   Time: 1125-1138 OT Time Calculation (min): 13 min  Charges: OT G-codes **NOT FOR INPATIENT CLASS** Functional Assessment Tool Used: Clinical judgment based on pt. current functional status. Functional Limitation: Self care Self Care Current Status [phone]): At least 20 percent but less than 40 percent impaired, limited or restricted Self Care Goal Status ([id number](G8988): At least 1 percent but less than 20 percent impaired, limited or restricted OT General Charges $OT Visit: 1 Procedure OT Treatments $Self Care/Home Management : 8-22 mins   Brian MessierElaine Ismerai Bin, MS, OTR/L  Brian Hampton 02/02/2016, 12:14 PM

## 2016-02-02 NOTE — Progress Notes (Signed)
Dr Winona Legatovaickute notified of needing to be placed on methimazole 10mg  po qd. Will require VP for free t4 and tsh prior to seeing dr Tedd Siassolum. Telephone order received

## 2016-02-02 NOTE — Clinical Social Work Placement (Signed)
   CLINICAL SOCIAL WORK PLACEMENT  NOTE  Date:  02/02/2016  Patient Details  Name: Brian Hampton MRN: [id number] Date of Birth: [birth year]  Clinical Social Work is seeking post-discharge placement for this patient at the Skilled  Nursing Facility level of care (*CSW will initial, date and re-position this form in  chart as items are completed):  Yes   Patient/family provided with [facility] Clinical Social Work Department's list of facilities offering this level of care within the geographic area requested by the patient (or if unable, by the patient's family).  Yes   Patient/family informed of their freedom to choose among providers that offer the needed level of care, that participate in Medicare, Medicaid or managed care program needed by the patient, have an available bed and are willing to accept the patient.  Yes   Patient/family informed of [facility]'s ownership interest in [facility] and [facility], as well as of the fact that they are under no obligation to receive care at these facilities.  PASRR submitted to EDS on 01/30/16     PASRR number received on 01/30/16     Existing PASRR number confirmed on       FL2 transmitted to all facilities in geographic area requested by pt/family on 01/30/16     FL2 transmitted to all facilities within larger geographic area on       Patient informed that his/her managed care company has contracts with or will negotiate with certain facilities, including the following:        Yes   Patient/family informed of bed offers received.  Patient chooses bed at  [facility] )     Physician recommends and patient chooses bed at      Patient to be transferred to  [facility] ) on 02/02/16.  Patient to be transferred to facility by  [facility] )     Patient family notified on 02/02/16 of transfer.  Name of family member notified:   (Patient's sister Junious DresserConnie is aware of D/C today. )     PHYSICIAN        Additional Comment:    _______________________________________________ Haig ProphetMorgan, Emmagene Ortner G, LCSW 02/02/2016, 11:08 AM

## 2016-02-02 NOTE — Discharge Summary (Signed)
[facility] [place] at [facility]   PATIENT NAME: Brian Hampton    MR#:  [id number]  DATE OF BIRTH:  03/08/1956  DATE OF ADMISSION:  01/29/2016 ADMITTING PHYSICIAN: Keturah BarreJames J Crossley, MD  DATE OF DISCHARGE: No discharge date for patient encounter.  PRIMARY CARE PHYSICIAN: [facility]     ADMISSION DIAGNOSIS:  Weakness [R53.1] Frequent falls [R29.6]  DISCHARGE DIAGNOSIS:  Principal Problem:   Weakness Active Problems:   Hyperthyroidism   Steroid-induced myopathy   HIV (human immunodeficiency virus infection) (HCC)   Elevated transaminase level   Anemia   Thrombocytopenia (HCC)   Pyuria   Dysphagia   GERD (gastroesophageal reflux disease)   CAD (coronary artery disease)   COPD (chronic obstructive pulmonary disease) (HCC)   Diabetes mellitus (HCC)   SECONDARY DIAGNOSIS:   Past Medical History  Diagnosis Date  . Asthma   . COPD (chronic obstructive pulmonary disease) (HCC)   . Coronary artery disease   . Lung mass   . Anemia   . Emphysema/COPD (HCC)   . Myocardial infarction (HCC)     in 2000  . Type 2 diabetes mellitus (HCC)   . HIV (human immunodeficiency virus infection) (HCC)   . Collagen vascular disease (HCC)   . Hypertension     .pro HOSPITAL COURSE:   Patient is 60 year old Caucasian male with past medical history significant for history of HIV , generalized weakness, who was admitted to the hospital recently with negative workup and recommendations to get outpatient EMG who presents to the hospital with a worsening weakness in left upper and lower extremities. He also admitted of falls and numbness in the left lower extremity. On arrival to the hospital patient was noted to have mild elevation of transaminases, troponin, mild anemia and thrombocytopenia. Hemoglobin A1c was checked, was found to be 7.2. Patient's TSH was found to be low at 0.037, free T4 was elevated at 1.87. Patient was seen by endocrinologist, Dr.  Tedd SiasSolum, who felt the patient has hyperthyroidism, also likely steroid-induced myopathy attributed to combination of Dulera and Genvoya. She recommended to initiate patient on methimazole, obtain thyroid antibodies to assess for Graves' disease, repeat TSH and free T4 in 3 days, stop methimazole. If thyroid labs are normal and thyroid antibodies are negative, which would suggest thyroiditis. Follow-up with Dr. Tedd SiasSolum as outpatient. In regards to steroid-induced myopathy, she felt the diagnosis is clinical,  Genvoya was stopped. She felt that he should expect gradual improvement in the upcoming weeks with his steroid-induced myopathy. In regards to HIV, the patient's Genvoya  was stopped and replaced with Triumeq. Sore throat, scratchy feeling in the throat, difficulty swallowing, hoarseness. Patient was evaluated by speech therapist and recommended dysphagia 1 diet due to scratchy feeling in the throat. He also underwent barium swallowing study, revealing mild gastroesophageal reflux, otherwise negative exam. CT of soft tissues of the neck without contrast 25th of June 2017 showed no masses or other abnormality in the area of clinical concern. He was little concern over about candidiasis in esophagus due to patient being on Diflucan. Patient was seen by physical therapist and recommended skilled nursing facility placement.  Discussion by problem: #1 Weakness left >> right MRI brain and spine were normal. It was felt that patient's weakness was likely steroid-induced myopathy. Patient was evaluated by physical therapist and recommended skilled nursing facility placement for rehabilitation.  #2 steroid-induced myopathy, [facility]YA, GENVOYA was stopped and replaced with Triumeq.  MRI brain and  spine normal  #3 hyperthyroidism which could be contributing. Discussed with neurology and Endocrine Dr. Tedd Sias . Dr. Lynnea Ferrier recommended to initiate patient on methimazole, recheck free T4 on 02/02/2016,  discontinue methimazole if  free T4 is normal and thyroid antibodies are normal. Thyrotropin receptor antibodies were checked on 01/30/2016, normal. Free T4 and TSH are pending. On 02/02/2016.     #4 HIV, GENVOYA was changed to Triumeq by Dr. Sampson Goon   #5 Essential hypertension, Coreg, satisfactorily controlled  #6. GERD without esophagitis PPI therapy, likely the reason for scratchy throat and hoarseness, , less likely Esophageal candidiasis since patient has been taking Diflucan at home, patient would benefit from elevation of his bed by 30, follow-up with ENT  #7. Dysphagia, dysphagia 1 diet is recommended by speech therapist and agreed by patient, likely related to gastroesophageal reflux disease.   DISCHARGE CONDITIONS:   Stable  CONSULTS OBTAINED:  Treatment Team:  Kym Groom, MD  DRUG ALLERGIES:   Allergies  Allergen Reactions  . Aspirin Anaphylaxis  . Bee Venom Anaphylaxis  . Penicillins Anaphylaxis and Other (See Comments)    Has patient had a PCN reaction causing immediate rash, facial/tongue/throat swelling, SOB or lightheadedness with hypotension: Yes Has patient had a PCN reaction causing severe rash involving mucus membranes or skin necrosis: No Has patient had a PCN reaction that required hospitalization No Has patient had a PCN reaction occurring within the last 10 years: Yes If all of the above answers are "NO", then may proceed with Cephalosporin use.  . Prednisone Anaphylaxis  . Sulfa Antibiotics Anaphylaxis  . Sulfasalazine Anaphylaxis  . Theophyllines Anaphylaxis  . Theophylline Swelling    DISCHARGE MEDICATIONS:   Current Discharge Medication List    START taking these medications   Details  senna-docusate (SENOKOT-S) 8.6-50 MG tablet Take 1 tablet by mouth at bedtime as needed for mild constipation. Qty: 30 tablet, Refills: 3      CONTINUE these medications which have CHANGED   Details  clonazePAM (KLONOPIN) 0.5 MG tablet Take 1  tablet (0.5 mg total) by mouth 2 (two) times daily as needed for anxiety. Qty: 30 tablet, Refills: 0    traMADol (ULTRAM) 50 MG tablet Take 1 tablet (50 mg total) by mouth 2 (two) times daily. Qty: 30 tablet, Refills: 0    zolpidem (AMBIEN CR) 12.5 MG CR tablet Take 1 tablet (12.5 mg total) by mouth at bedtime as needed for sleep. Qty: 30 tablet, Refills: 0      CONTINUE these medications which have NOT CHANGED   Details  albuterol (PROVENTIL HFA;VENTOLIN HFA) 108 (90 BASE) MCG/ACT inhaler Inhale 2 puffs into the lungs every 6 (six) hours as needed for wheezing or shortness of breath.    albuterol (PROVENTIL) (2.5 MG/3ML) 0.083% nebulizer solution Take 2.5 mg by nebulization every 6 (six) hours as needed for wheezing or shortness of breath.    atorvastatin (LIPITOR) 40 MG tablet Take 40 mg by mouth at bedtime.    Calcium Carbonate-Vitamin D (CALCIUM 600+D) 600-400 MG-UNIT tablet Take 1 tablet by mouth 2 (two) times daily.    carvedilol (COREG) 12.5 MG tablet Take 12.5 mg by mouth 2 (two) times daily with a meal.    cetirizine (ZYRTEC) 10 MG tablet Take 10 mg by mouth daily.    EPINEPHrine (EPIPEN 2-PAK) 0.3 mg/0.3 mL IJ SOAJ injection Inject 0.3 mg into the muscle once as needed (for severe allergic reaction).    escitalopram (LEXAPRO) 20 MG tablet Take 20 mg by mouth daily.  fluconazole (DIFLUCAN) 200 MG tablet Take 200 mg by mouth daily.    fluticasone (FLONASE) 50 MCG/ACT nasal spray Place 1 spray into both nostrils daily.    gemfibrozil (LOPID) 600 MG tablet Take 600 mg by mouth 2 (two) times daily before a meal.    isosorbide mononitrate (IMDUR) 30 MG 24 hr tablet Take 30 mg by mouth daily.    linaclotide (LINZESS) 290 MCG CAPS capsule Take 290 mcg by mouth daily before breakfast.    meloxicam (MOBIC) 7.5 MG tablet Take 7.5 mg by mouth 2 (two) times daily.    mirtazapine (REMERON) 30 MG tablet Take 30 mg by mouth at bedtime.    mometasone-formoterol (DULERA) 200-5  MCG/ACT AERO Inhale 2 puffs into the lungs 2 (two) times daily.     Multiple Vitamin (MULTIVITAMIN WITH MINERALS) TABS tablet Take 1 tablet by mouth daily.    nitroGLYCERIN (NITROSTAT) 0.4 MG SL tablet Place 0.4 mg under the tongue every 5 (five) minutes as needed for chest pain.    omeprazole (PRILOSEC) 20 MG capsule Take 20 mg by mouth 2 (two) times daily before a meal.    tiotropium (SPIRIVA) 18 MCG inhalation capsule Place 18 mcg into inhaler and inhale daily.    abacavir-dolutegravir-lamiVUDine (TRIUMEQ) 600-50-300 MG tablet Take 1 tablet by mouth daily. Reported on 01/30/2016      STOP taking these medications     elvitegravir-cobicistat-emtricitabine-tenofovir (GENVOYA) 150-150-200-10 MG TABS tablet          DISCHARGE INSTRUCTIONS:    Patient is to follow-up with endocrinologist, infectious disease specialist, primary care physician within one week after discharge  If you experience worsening of your admission symptoms, develop shortness of breath, life threatening emergency, suicidal or homicidal thoughts you must seek medical attention immediately by calling 911 or calling your MD immediately  if symptoms less severe.  You Must read complete instructions/literature along with all the possible adverse reactions/side effects for all the Medicines you take and that have been prescribed to you. Take any new Medicines after you have completely understood and accept all the possible adverse reactions/side effects.   Please note  You were cared for by a hospitalist during your hospital stay. If you have any questions about your discharge medications or the care you received while you were in the hospital after you are discharged, you can call the unit and asked to speak with the hospitalist on call if the hospitalist that took care of you is not available. Once you are discharged, your primary care physician will handle any further medical issues. Please note that NO REFILLS for any  discharge medications will be authorized once you are discharged, as it is imperative that you return to your primary care physician (or establish a relationship with a primary care physician if you do not have one) for your aftercare needs so that they can reassess your need for medications and monitor your lab values.    Today   CHIEF COMPLAINT:   Chief Complaint  Patient presents with  . Weakness  . Fall    HISTORY OF PRESENT ILLNESS:  Brian Hampton  is a 60 y.o. male with a known history of HIV , generalized weakness, who was admitted to the hospital recently with negative workup and recommendations to get outpatient EMG who presents to the hospital with a worsening weakness in left upper and lower extremities. He also admitted of falls and numbness in the left lower extremity. On arrival to the hospital patient was noted to  have mild elevation of transaminases, troponin, mild anemia and thrombocytopenia. Hemoglobin A1c was checked, was found to be 7.2. Patient's TSH was found to be low at 0.037, free T4 was elevated at 1.87. Patient was seen by endocrinologist, Dr. Tedd Sias, who felt the patient has hyperthyroidism, also likely steroid-induced myopathy attributed to combination of Dulera and Genvoya. She recommended to initiate patient on methimazole, obtain thyroid antibodies to assess for Graves' disease, repeat TSH and free T4 in 3 days, stop methimazole. If thyroid labs are normal and thyroid antibodies are negative, which would suggest thyroiditis. Follow-up with Dr. Tedd Sias as outpatient. In regards to steroid-induced myopathy, she felt the diagnosis is clinical,  Genvoya was stopped. She felt that he should expect gradual improvement in the upcoming weeks with his steroid-induced myopathy. In regards to HIV, the patient's Genvoya  was stopped and replaced with Triumeq. Sore throat, scratchy feeling in the throat, difficulty swallowing, hoarseness. Patient was evaluated by speech therapist and  recommended dysphagia 1 diet due to scratchy feeling in the throat. He also underwent barium swallowing study, revealing mild gastroesophageal reflux, otherwise negative exam. CT of soft tissues of the neck without contrast 25th of June 2017 showed no masses or other abnormality in the area of clinical concern. He was little concern over about candidiasis in esophagus due to patient being on Diflucan. Patient was seen by physical therapist and recommended skilled nursing facility placement.  Discussion by problem: #1 Weakness left >> right MRI brain and spine were normal. It was felt that patient's weakness was likely steroid-induced myopathy. Patient was evaluated by physical therapist and recommended skilled nursing facility placement for rehabilitation.  #2 steroid-induced myopathy, due to [facility] and GENVOYA, GENVOYA was stopped and replaced with Triumeq.  MRI brain and spine normal  #3 hyperthyroidism which could be contributing. Discussed with neurology and Endocrine Dr. Tedd Sias . Dr. Lynnea Ferrier recommended to initiate patient on methimazole, recheck free T4 on 02/02/2016, discontinue methimazole if  free T4 is normal and thyroid antibodies are normal. Thyrotropin receptor antibodies were checked on 01/30/2016, normal. Free T4 and TSH are pending. On 02/02/2016.     #4 HIV, GENVOYA was changed to Triumeq by Dr. Sampson Goon   #5 Essential hypertension, Coreg, satisfactorily controlled  #6. GERD without esophagitis PPI therapy, likely the reason for scratchy throat and hoarseness, , less likely Esophageal candidiasis since patient has been taking Diflucan at home, patient would benefit from elevation of his bed by 30, follow-up with ENT  #7. Dysphagia, dysphagia 1 diet is recommended by speech therapist and agreed by patient, likely related to gastroesophageal reflux disease.      VITAL SIGNS:  Blood pressure 129/77, pulse 110, temperature 98.3 F (36.8 C), temperature source Oral, resp.  rate 22, height  (1.651 m), weight 68.04 kg (150 lb), SpO2 94 %.  I/O:    Intake/Output Summary (Last 24 hours) at 02/02/16 0939 Last data filed at 02/02/16 1610  Gross per 24 hour  Intake    480 ml  Output    550 ml  Net    -70 ml    PHYSICAL EXAMINATION:  GENERAL:  60 y.o.-year-old patient lying in the bed with no acute distress. Hoarse speech EYES: Pupils equal, round, reactive to light and accommodation. No scleral icterus. Extraocular muscles intact.  HEENT: Head atraumatic, normocephalic. Oropharynx and nasopharynx clear.  NECK:  Supple, no jugular venous distention. No thyroid enlargement, no tenderness.  LUNGS: Normal breath sounds bilaterally, no wheezing, rales,rhonchi or crepitation. No use of accessory  muscles of respiration.  CARDIOVASCULAR: S1, S2 normal. No murmurs, rubs, or gallops.  ABDOMEN: Soft, non-tender, non-distended. Bowel sounds present. No organomegaly or mass.  EXTREMITIES: No pedal edema, cyanosis, or clubbing.  NEUROLOGIC: Cranial nerves II through XII are intact. Muscle strength 4/5 in all extremities. Sensation intact. Gait not checked.  PSYCHIATRIC: The patient is alert and oriented x 3. Some slurring of speech SKIN: No obvious rash, lesion, or ulcer.   DATA REVIEW:   CBC  Recent Labs Lab 02/01/16 0419  WBC 6.1  HGB 11.6*  HCT 32.6*  PLT 117*    Chemistries   Recent Labs Lab 01/31/16 0431 02/01/16 0419  NA  --  140  K  --  4.0  CL  --  104  CO2  --  29  GLUCOSE  --  166*  BUN  --  25*  CREATININE  --  0.95  CALCIUM  --  8.9  AST 31  --   ALT 86*  --   ALKPHOS 95  --   BILITOT 0.4  --     Cardiac Enzymes  Recent Labs Lab 01/30/16 1242  TROPONINI 0.06*    Microbiology Results  Results for orders placed or performed during the hospital encounter of 01/13/16  CSF culture     Status: None   Collection Time: 01/15/16 12:19 PM  Result Value Ref Range Status   Specimen Description CSF  Final   Special Requests  Immunocompromised  Final   Gram Stain NO ORGANISMS SEEN NO WBC SEEN CYTOSPIN SMEAR   Final   Culture   Final    NO GROWTH 3 DAYS Performed at [facility]    Report Status 01/19/2016 FINAL  Final  Culture, fungus without smear ([facility]-Only)     Status: None (Preliminary result)   Collection Time: 01/15/16 12:19 PM  Result Value Ref Range Status   Specimen Description CSF  Final   Special Requests Immunocompromised  Final   Culture   Final    NO FUNGUS ISOLATED AFTER 14 DAYS Performed at [facility]    Report Status PENDING  Incomplete    RADIOLOGY:  Ct Soft Tissue Neck W Contrast  02/01/2016  CLINICAL DATA:  Increased swelling/ mass involving the upper neck/ jaw area. Dysphagia. EXAM: CT NECK WITH CONTRAST TECHNIQUE: Multidetector CT imaging of the neck was performed using the standard protocol following the bolus administration of intravenous contrast. CONTRAST:  75mL ISOVUE-300 IOPAMIDOL (ISOVUE-300) INJECTION 61% COMPARISON:  Head and cervical spine CT 01/29/2016. Cervical spine MRI 01/14/2016. FINDINGS: Pharynx and larynx: Mildly limited evaluation due to motion artifact without mass or other significant abnormality identified. Salivary glands: Fatty infiltration of the submandibular and parotid glands bilaterally. No mass, calculi, or submandibular space inflammation seen. A vitamin-E marker placed indicate the area of clinical concern is in the left submandibular region without mass or other abnormality identified deep to it. Thyroid: Possible small lower pole thyroid nodules, with evaluation limited by motion artifact. Lymph nodes: No enlarged lymph nodes are identified in the neck. Vascular: Major vascular structures of the neck appear patent. There is prominent noncalcified plaque involving the right carotid bifurcation with at most mild proximal ICA narrowing. Limited intracranial: Partially visualized cerebral atrophy. Visualized orbits: Mild bilateral exophthalmos.  Globes appear intact without orbital mass identified. Mastoids and visualized paranasal sinuses: Prior sinus surgery. Small volume right sphenoid sinus fluid. Clear mastoid air cells. Skeleton: Unfused anterior arch of C1 with mild degenerative change as well as absence of  the left half of the posterior C1 ring. Upper chest: Clear lung apices. IMPRESSION: No mass or other abnormality identified in the area of clinical concern. Electronically Signed   By: Sebastian Ache M.D.   On: 02/01/2016 11:07   Ct Abdomen Pelvis W Contrast  01/31/2016  CLINICAL DATA:  Intermittent lower abdominal pain for 3 weeks EXAM: CT ABDOMEN AND PELVIS WITH CONTRAST TECHNIQUE: Multidetector CT imaging of the abdomen and pelvis was performed using the standard protocol following bolus administration of intravenous contrast. CONTRAST:  ISOVUE-300 IOPAMIDOL (ISOVUE-300) INJECTION 61% COMPARISON:  CT scan 10/02/2011 FINDINGS: Lower chest:  The lung bases are unremarkable. Hepatobiliary: Enhanced liver is unremarkable. No calcified gallstones are noted within gallbladder. Pancreas: Enhanced pancreas is unremarkable. Spleen: Enhanced spleen is unremarkable. Adrenals/Urinary Tract: No adrenal gland mass. Enhanced kidneys are symmetrical in size. Mild lobulated renal contour. No hydronephrosis or hydroureter. Delayed renal images shows bilateral renal symmetrical excretion. Bilateral visualized proximal ureter is unremarkable. Moderate distended urinary bladder. No bladder filling defects are noted. Bilateral distal ureter is unremarkable. Stomach/Bowel: There is no gastric outlet obstruction. No thickened or dilated small bowel loops. There is no evidence of small bowel obstruction. No pericecal inflammation. The terminal ileum is unremarkable. Normal appendix partially visualized in sagittal image 30. Scattered diverticula are noted descending colon. Moderate stool noted within sigmoid colon. There is redundant sigmoid colon. Abundant  stool noted within distal sigmoid colon and rectum. The rectum measures 6.4 cm in diameter suspicious for mild fecal impaction. Vascular/Lymphatic: No aortic aneurysm. No retroperitoneal or mesenteric adenopathy. Reproductive: Prostate gland and seminal vesicles are unremarkable. Other: No ascites or free air. There is tiny umbilical hernia containing omental fat without evidence of acute complication. Small urachal remnant noted anterior superior aspect of urinary bladder. Musculoskeletal: No destructive bony lesions are noted. Sagittal images of the spine shows degenerative changes thoracic and lumbar spine. IMPRESSION: 1. There is no evidence of acute inflammatory process within abdomen. 2. No hydronephrosis or hydroureter. 3. No pericecal inflammation.  Normal appendix. 4. No small bowel obstruction. 5. Moderate stool noted within redundant sigmoid colon. Abundant stool noted in distal sigmoid colon and rectum. The rectum is distended with stool up to 6.4 cm. Mild fecal impaction cannot be excluded. 6. Degenerative changes thoracolumbar spine 7. Moderate distended urinary bladder. No bladder filling defects are noted. Electronically Signed   By: Natasha Mead M.D.   On: 01/31/2016 15:14   Dg Ugi W/o Kub  02/02/2016  CLINICAL DATA:  Difficulty swallowing. EXAM: UPPER GI SERIES WITHOUT KUB TECHNIQUE: Routine upper GI series was performed with thin barium. FLUOROSCOPY TIME:  Radiation Exposure Index (as provided by the fluoroscopic device): 23.7 mGy COMPARISON:  CT 01/31/2016. FINDINGS: Thoracic esophagus is widely patent without focal abnormality. Mild reflux noted Stomach is widely patent. Duodenum bulb and C-loop are normal. Visualized proximal small bowel normal. IMPRESSION: Mild gastroesophageal reflux, otherwise negative exam. Electronically Signed   By: Maisie Fus  Register   On: 02/02/2016 08:52    EKG:   Orders placed or performed in visit on 01/29/16  . EKG 12-Lead  . EKG 12-Lead  . EKG 12-Lead       Management plans discussed with the patient, family and they are in agreement.  CODE STATUS:     Code Status Orders        Start     Ordered   01/30/16 0055  Do not attempt resuscitation (DNR)   Continuous    Question Answer Comment  In the event  of cardiac or respiratory ARREST Do not call a "code blue"   In the event of cardiac or respiratory ARREST Do not perform Intubation, CPR, defibrillation or ACLS   In the event of cardiac or respiratory ARREST Use medication by any route, position, wound care, and other measures to relive pain and suffering. May use oxygen, suction and manual treatment of airway obstruction as needed for comfort.      01/30/16 0054    Code Status History    Date Active Date Inactive Code Status Order ID Comments User Context   01/13/2016  9:46 PM 01/16/2016  4:18 PM Full Code [id number]  Shaune PollackQing Chen, MD Inpatient   06/24/2015  7:13 AM 06/25/2015 12:27 PM Full Code [id number]  [name], MD ED    Advance Directive Documentation        Most Recent Value   Type of Advance Directive  Living will   Pre-existing out of facility DNR order (yellow form or pink MOST form)     "MOST" Form in Place?        TOTAL TIME TAKING CARE OF THIS PATIENT: 40 minutes.    Katharina CaperVAICKUTE,Emanuele Mcwhirter M.D on 02/02/2016 at 9:39 AM  Between 7am to 6pm - Pager - [phone]  After 6pm go to [email or web address] - password [email or web address] [email or web address]  [facility]  Office  [phone]  CC: Primary care physician; [facility]

## 2016-02-05 DIAGNOSIS — F418 Other specified anxiety disorders: Secondary | ICD-10-CM | POA: Diagnosis not present

## 2016-02-05 DIAGNOSIS — I2510 Atherosclerotic heart disease of native coronary artery without angina pectoris: Secondary | ICD-10-CM | POA: Diagnosis not present

## 2016-02-05 DIAGNOSIS — I213 ST elevation (STEMI) myocardial infarction of unspecified site: Secondary | ICD-10-CM | POA: Diagnosis not present

## 2016-02-05 DIAGNOSIS — Z21 Asymptomatic human immunodeficiency virus [HIV] infection status: Secondary | ICD-10-CM | POA: Diagnosis not present

## 2016-02-05 DIAGNOSIS — J449 Chronic obstructive pulmonary disease, unspecified: Secondary | ICD-10-CM | POA: Diagnosis not present

## 2016-02-05 DIAGNOSIS — I10 Essential (primary) hypertension: Secondary | ICD-10-CM | POA: Diagnosis not present

## 2016-02-05 LAB — CULTURE, FUNGUS WITHOUT SMEAR

## 2016-02-06 DIAGNOSIS — K219 Gastro-esophageal reflux disease without esophagitis: Secondary | ICD-10-CM | POA: Diagnosis not present

## 2016-02-06 DIAGNOSIS — G720 Drug-induced myopathy: Secondary | ICD-10-CM | POA: Diagnosis not present

## 2016-02-06 DIAGNOSIS — I10 Essential (primary) hypertension: Secondary | ICD-10-CM | POA: Diagnosis not present

## 2016-02-06 DIAGNOSIS — Z21 Asymptomatic human immunodeficiency virus [HIV] infection status: Secondary | ICD-10-CM | POA: Diagnosis not present

## 2016-02-06 DIAGNOSIS — E039 Hypothyroidism, unspecified: Secondary | ICD-10-CM | POA: Diagnosis not present

## 2016-02-07 LAB — METHYLMALONIC ACID, SERUM: METHYLMALONIC ACID, QUANTITATIVE: 519 nmol/L — AB (ref 0–378)

## 2016-02-12 LAB — T3: T3 TOTAL: 110 ng/dL (ref 71–180)

## 2016-02-12 LAB — THYROID STIMULATING IMMUNOGLOBULIN: THYROID STIMULATING IMMUNOGLOB: 23 % (ref 0–139)

## 2016-02-13 DIAGNOSIS — M79651 Pain in right thigh: Secondary | ICD-10-CM | POA: Diagnosis not present

## 2016-02-13 DIAGNOSIS — G720 Drug-induced myopathy: Secondary | ICD-10-CM | POA: Diagnosis not present

## 2016-02-13 DIAGNOSIS — Z048 Encounter for examination and observation for other specified reasons: Secondary | ICD-10-CM | POA: Diagnosis not present

## 2016-02-14 ENCOUNTER — Encounter: Payer: Self-pay | Admitting: Emergency Medicine

## 2016-02-14 ENCOUNTER — Emergency Department
Admission: EM | Admit: 2016-02-14 | Discharge: 2016-02-14 | Disposition: A | Discharge status: Home / Self Care | Payer: Medicare Other | Attending: Emergency Medicine | Admitting: Emergency Medicine

## 2016-02-14 DIAGNOSIS — I2510 Atherosclerotic heart disease of native coronary artery without angina pectoris: Secondary | ICD-10-CM | POA: Diagnosis not present

## 2016-02-14 DIAGNOSIS — Z7951 Long term (current) use of inhaled steroids: Secondary | ICD-10-CM | POA: Insufficient documentation

## 2016-02-14 DIAGNOSIS — I82491 Acute embolism and thrombosis of other specified deep vein of right lower extremity: Secondary | ICD-10-CM | POA: Insufficient documentation

## 2016-02-14 DIAGNOSIS — I82401 Acute embolism and thrombosis of unspecified deep veins of right lower extremity: Secondary | ICD-10-CM | POA: Diagnosis not present

## 2016-02-14 DIAGNOSIS — I82811 Embolism and thrombosis of superficial veins of right lower extremities: Secondary | ICD-10-CM | POA: Diagnosis not present

## 2016-02-14 DIAGNOSIS — I252 Old myocardial infarction: Secondary | ICD-10-CM | POA: Diagnosis not present

## 2016-02-14 DIAGNOSIS — Z87891 Personal history of nicotine dependence: Secondary | ICD-10-CM | POA: Insufficient documentation

## 2016-02-14 DIAGNOSIS — J449 Chronic obstructive pulmonary disease, unspecified: Secondary | ICD-10-CM | POA: Insufficient documentation

## 2016-02-14 DIAGNOSIS — E0590 Thyrotoxicosis, unspecified without thyrotoxic crisis or storm: Secondary | ICD-10-CM | POA: Insufficient documentation

## 2016-02-14 DIAGNOSIS — Z21 Asymptomatic human immunodeficiency virus [HIV] infection status: Secondary | ICD-10-CM | POA: Insufficient documentation

## 2016-02-14 DIAGNOSIS — J45909 Unspecified asthma, uncomplicated: Secondary | ICD-10-CM | POA: Insufficient documentation

## 2016-02-14 DIAGNOSIS — I10 Essential (primary) hypertension: Secondary | ICD-10-CM | POA: Insufficient documentation

## 2016-02-14 DIAGNOSIS — Z79899 Other long term (current) drug therapy: Secondary | ICD-10-CM | POA: Insufficient documentation

## 2016-02-14 DIAGNOSIS — E119 Type 2 diabetes mellitus without complications: Secondary | ICD-10-CM | POA: Insufficient documentation

## 2016-02-14 LAB — CBC WITH DIFFERENTIAL/PLATELET
BASOS ABS: 0.1 10*3/uL (ref 0–0.1)
Basophils Relative: 1 %
EOS PCT: 1 %
Eosinophils Absolute: 0 10*3/uL (ref 0–0.7)
HCT: 33.2 % — ABNORMAL LOW (ref 40.0–52.0)
Hemoglobin: 11.1 g/dL — ABNORMAL LOW (ref 13.0–18.0)
LYMPHS PCT: 31 %
Lymphs Abs: 2.4 10*3/uL (ref 1.0–3.6)
MCH: 33.8 pg (ref 26.0–34.0)
MCHC: 33.4 g/dL (ref 32.0–36.0)
MCV: 101.1 fL — AB (ref 80.0–100.0)
Monocytes Absolute: 1 10*3/uL (ref 0.2–1.0)
Monocytes Relative: 13 %
Neutro Abs: 4.2 10*3/uL (ref 1.4–6.5)
Neutrophils Relative %: 54 %
PLATELETS: 313 10*3/uL (ref 150–440)
RBC: 3.29 MIL/uL — AB (ref 4.40–5.90)
RDW: 14.8 % — ABNORMAL HIGH (ref 11.5–14.5)
WBC: 7.7 10*3/uL (ref 3.8–10.6)

## 2016-02-14 LAB — COMPREHENSIVE METABOLIC PANEL
ALT: 24 U/L (ref 17–63)
AST: 25 U/L (ref 15–41)
Albumin: 1.9 g/dL — ABNORMAL LOW (ref 3.5–5.0)
Alkaline Phosphatase: 149 U/L — ABNORMAL HIGH (ref 38–126)
Anion gap: 9 (ref 5–15)
BUN: 16 mg/dL (ref 6–20)
CHLORIDE: 103 mmol/L (ref 101–111)
CO2: 25 mmol/L (ref 22–32)
Calcium: 8.4 mg/dL — ABNORMAL LOW (ref 8.9–10.3)
Creatinine, Ser: 1.12 mg/dL (ref 0.61–1.24)
GFR calc Af Amer: >60 mL/min (ref >60)
Glucose, Bld: 144 mg/dL — ABNORMAL HIGH (ref 65–99)
POTASSIUM: 3.8 mmol/L (ref 3.5–5.1)
SODIUM: 137 mmol/L (ref 135–145)
TOTAL PROTEIN: 6.4 g/dL — AB (ref 6.5–8.1)
Total Bilirubin: 0.6 mg/dL (ref 0.3–1.2)

## 2016-02-14 MED ORDER — APIXABAN 5 MG PO TABS: ORAL_TABLET | ORAL | Status: DC

## 2016-02-14 MED ORDER — APIXABAN 5 MG PO TABS
10.0000 mg | ORAL_TABLET | Freq: Two times a day (BID) | ORAL | Status: DC
  Administered 2016-02-14: 10 mg via ORAL
  Filled 2016-02-14: qty 2

## 2016-02-14 NOTE — ED Provider Notes (Signed)
[facility] Emergency Department Provider Note        Time seen: ----------------------------------------- 8:11 PM on 02/14/2016 -----------------------------------------    I have reviewed the triage vital signs and the nursing notes.   HISTORY  Chief Complaint Leg Pain    HPI Brian Hampton is a 60 y.o. male who presents to ER for a known DVT that was diagnosed today by outpatient ultrasound. Patient had been complaining of pain in his right leg and had an ultrasound performed at [facility] health care. Patient and family prefers treatment done in the ER instead of at the nursing home. Patient denies any fevers, chills, chest pain, shortness of breath or other complaints.   Past Medical History  Diagnosis Date  . Asthma   . COPD (chronic obstructive pulmonary disease) (HCC)   . Coronary artery disease   . Lung mass   . Anemia   . Emphysema/COPD (HCC)   . Myocardial infarction (HCC)     in 2000  . Type 2 diabetes mellitus (HCC)   . HIV (human immunodeficiency virus infection) (HCC)   . Collagen vascular disease (HCC)   . Hypertension     Patient Active Problem List   Diagnosis Date Noted  . Dysphagia 02/02/2016  . GERD (gastroesophageal reflux disease) 02/02/2016  . Hyperthyroidism 01/31/2016  . 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 Elevated transaminase level 01/31/2016  . Anemia 01/31/2016  . Thrombocytopenia (HCC) 01/31/2016  . Pyuria 01/31/2016  . Weakness 01/29/2016  . HIV (human immunodeficiency virus infection) (HCC) 01/29/2016  . CAD (coronary artery disease) 01/29/2016  . COPD (chronic obstructive pulmonary disease) (HCC) 01/29/2016  . Diabetes mellitus (HCC) 01/29/2016  . Leg weakness, bilateral 01/13/2016  . Chest pain 06/24/2015    History reviewed. No pertinent past surgical history.  Allergies Aspirin; Bee venom; Penicillins; Prednisone; Sulfa antibiotics; Sulfasalazine; Theophyllines; and Theophylline  Social  History Social History  Substance Use Topics  . Smoking status: Former Games developer  . Smokeless tobacco: Never Used  . Alcohol Use: No    Review of Systems Constitutional: Negative for fever. Cardiovascular: Negative for chest pain. Respiratory: Negative for shortness of breath. Gastrointestinal: Negative for abdominal pain, vomiting and diarrhea. Musculoskeletal: Positive for right leg pain Skin: Positive for contusions Neurological: Negative for headaches, focal weakness or numbness.  10-point ROS otherwise negative.  ____________________________________________   PHYSICAL EXAM:  VITAL SIGNS: ED Triage Vitals  Enc Vitals Group     BP 02/14/16 2005 144/87 mmHg     Pulse Rate 02/14/16 2005 95     Resp 02/14/16 2005 16     Temp 02/14/16 2005 97.6 F (36.4 C)     Temp Source 02/14/16 2005 Oral     SpO2 02/14/16 2005 95 %     Weight --      Height --      Head Cir --      Peak Flow --      Pain Score 02/14/16 2007 10     Pain Loc --      Pain Edu? --      Excl. in GC? --     Constitutional: Alert and oriented. Chronically ill-appearing, in no acute distress. Moon-like facies Eyes: Conjunctivae are normal. PERRL. Normal extraocular movements. Cardiovascular: Normal rate, regular rhythm. No murmurs, rubs, or gallops. Respiratory: Normal respiratory effort without tachypnea nor retractions. Breath sounds are clear and equal bilaterally. No wheezes/rales/rhonchi. Gastrointestinal: Soft and nontender. Normal bowel sounds Musculoskeletal: Right leg tenderness, particularly behind the right knee. Right  leg is slightly swollen compared to left Neurologic:  Normal speech and language. No gross focal neurologic deficits are appreciated.  Skin:  Skin is warm, dry and intact. Scattered contusions are appreciated ____________________________________________  ED COURSE:  Pertinent labs & imaging results that were available during my care of the patient were reviewed by me and  considered in my medical decision making (see chart for details). Patient presents to the ER for outpatient diagnosis DVT. I will check basic labs and likely start on Eliquis. ____________________________________________    LABS (pertinent positives/negatives)  Labs Reviewed  CBC WITH DIFFERENTIAL/PLATELET - Abnormal; Notable for the following:    RBC 3.29 (*)    Hemoglobin 11.1 (*)    HCT 33.2 (*)    MCV 101.1 (*)    RDW 14.8 (*)    All other components within normal limits  COMPREHENSIVE METABOLIC PANEL - Abnormal; Notable for the following:    Glucose, Bld 144 (*)    Calcium 8.4 (*)    Total Protein 6.4 (*)    Albumin 1.9 (*)    Alkaline Phosphatase 149 (*)    All other components within normal limits    RADIOLOGY Right lower extremity ultrasoundPerformed as an outpatient reveals DVT  ____________________________________________  FINAL ASSESSMENT AND PLAN  DVT  Plan: Patient with labs and imaging as dictated above. Patient with labs as dictated above. He has been started on Eliquis, he will will be prescribed Eliquis and will have outpatient follow-up.    Emily FilbertWilliams, Jonathan E, MD   Note: This dictation was prepared with Dragon dictation. Any transcriptional errors that result from this process are unintentional   Emily FilbertJonathan E Williams, MD 02/14/16 2118

## 2016-02-14 NOTE — Discharge Instructions (Signed)
Deep Vein Thrombosis °A deep vein thrombosis (DVT) is a blood clot (thrombus) that usually occurs in a deep, larger vein of the lower leg or the pelvis, or in an upper extremity such as the arm. These are dangerous and can lead to serious and even life-threatening complications if the clot travels to the lungs. °A DVT can damage the valves in your leg veins so that instead of flowing upward, the blood pools in the lower leg. This is called post-thrombotic syndrome, and it can result in pain, swelling, discoloration, and sores on the leg. °CAUSES °A DVT is caused by the formation of a blood clot in your leg, pelvis, or arm. Usually, several things contribute to the formation of blood clots. A clot may develop when: °· Your blood flow slows down. °· Your vein becomes damaged in some way. °· You have a condition that makes your blood clot more easily. °RISK FACTORS °A DVT is more likely to develop in: °· People who are older, especially over 60 years of age. °· People who are overweight (obese). °· People who sit or lie still for a long time, such as during long-distance travel (over 4 hours), bed rest, hospitalization, or during recovery from certain medical conditions like a stroke. °· People who do not engage in much physical activity (sedentary lifestyle). °· People who have chronic breathing disorders. °· People who have a personal or family history of blood clots or blood clotting disease. °· People who have peripheral vascular disease (PVD), diabetes, or some types of cancer. °· People who have heart disease, especially if the person had a recent heart attack or has congestive heart failure. °· People who have neurological diseases that affect the legs (leg paresis). °· People who have had a traumatic injury, such as breaking a hip or leg. °· People who have recently had major or lengthy surgery, especially on the hip, knee, or abdomen. °· People who have had a central line placed inside a large vein. °· People  who take medicines that contain the hormone estrogen. These include birth control pills and hormone replacement therapy. °· Pregnancy or during childbirth or the postpartum period. °· Long plane flights (over 8 hours). °SIGNS AND SYMPTOMS °Symptoms of a DVT can include:  °· Swelling of your leg or arm, especially if one side is much worse. °· Warmth and redness of your leg or arm, especially if one side is much worse. °· Pain in your arm or leg. If the clot is in your leg, symptoms may be more noticeable or worse when you stand or walk. °· A feeling of pins and needles, if the clot is in the arm. °The symptoms of a DVT that has traveled to the lungs (pulmonary embolism, PE) usually start suddenly and include: °· Shortness of breath while active or at rest. °· Coughing or coughing up blood or blood-tinged mucus. °· Chest pain that is often worse with deep breaths. °· Rapid or irregular heartbeat. °· Feeling light-headed or dizzy. °· Fainting. °· Feeling anxious. °· Sweating. °There may also be pain and swelling in a leg if that is where the blood clot started. °These symptoms may represent a serious problem that is an emergency. Do not wait to see if the symptoms will go away. Get medical help right away. Call your local emergency services (911 in the [place] Do not drive yourself to the hospital. °DIAGNOSIS °Your health care provider will take a medical history and perform a physical exam. You may also   have other tests, including: °· Blood tests to assess the clotting properties of your blood. °· Imaging tests, such as CT, ultrasound, MRI, X-ray, and other tests to see if you have clots anywhere in your body. °TREATMENT °After a DVT is identified, it can be treated. The type of treatment that you receive depends on many factors, such as the cause of your DVT, your risk for bleeding or developing more clots, and other medical conditions that you have. Sometimes, a combination of treatments is necessary. Treatment  options may be combined and include: °· Monitoring the blood clot with ultrasound. °· Taking medicines by mouth, such as newer blood thinners (anticoagulants), thrombolytics, or warfarin. °· Taking anticoagulant medicine by injection or through an IV tube. °· Wearing compression stockings or using different types of devices. °· Surgery (rare) to remove the blood clot or to place a filter in your abdomen to stop the blood clot from traveling to your lungs. °Treatments for a DVT are often divided into immediate treatment and long-term treatment (up to 3 months after DVT). You can work with your health care provider to choose the treatment program that is best for you. °HOME CARE INSTRUCTIONS °If you are taking a newer oral anticoagulant: °· Take the medicine every single day at the same time each day. °· Understand what foods and drugs interact with this medicine. °· Understand that there are no regular blood tests required when using this medicine. °· Understand the side effects of this medicine, including excessive bruising or bleeding. Ask your health care provider or pharmacist about other possible side effects. °If you are taking warfarin: °· Understand how to take warfarin and know which foods can affect how warfarin works in your body. °· Understand that it is dangerous to take too much or too little warfarin. Too much warfarin increases the risk of bleeding. Too little warfarin continues to allow the risk for blood clots. °· Follow your PT and INR blood testing schedule. The PT and INR results allow your health care provider to adjust your dose of warfarin. It is very important that you have your PT and INR tested as often as told by your health care provider. °· Avoid major changes in your diet, or tell your health care provider before you change your diet. Arrange a visit with a registered dietitian to answer your questions. Many foods, especially foods that are high in vitamin K, can interfere with warfarin  and affect the PT and INR results. Eat a consistent amount of foods that are high in vitamin K, such as: °¨ Spinach, kale, broccoli, cabbage, collard greens, turnip greens, Brussels sprouts, peas, cauliflower, seaweed, and parsley. °¨ Beef liver and pork liver. °¨ Green tea. °¨ Soybean oil. °· Tell your health care provider about any and all medicines, vitamins, and supplements that you take, including aspirin and other over-the-counter anti-inflammatory medicines. Be especially cautious with aspirin and anti-inflammatory medicines. Do not take those before you ask your health care provider if it is safe to do so. This is important because many medicines can interfere with warfarin and affect the PT and INR results. °· Do not start or stop taking any over-the-counter or prescription medicine unless your health care provider or pharmacist tells you to do so. °If you take warfarin, you will also need to do these things: °· Hold pressure over cuts for longer than usual. °· Tell your dentist and other health care providers that you are taking warfarin before you have any procedures in which   bleeding may occur. °· Avoid alcohol or drink very small amounts. Tell your health care provider if you change your alcohol intake. °· Do not use tobacco products, including cigarettes, chewing tobacco, and e-cigarettes. If you need help quitting, ask your health care provider. °· Avoid contact sports. °General Instructions °· Take over-the-counter and prescription medicines only as told by your health care provider. Anticoagulant medicines can have side effects, including easy bruising and difficulty stopping bleeding. If you are prescribed an anticoagulant, you will also need to do these things: °¨ Hold pressure over cuts for longer than usual. °¨ Tell your dentist and other health care providers that you are taking anticoagulants before you have any procedures in which bleeding may occur. °¨ Avoid contact sports. °· Wear a medical  alert bracelet or carry a medical alert card that says you have had a PE. °· Ask your health care provider how soon you can go back to your normal activities. Stay active to prevent new blood clots from forming. °· Make sure to exercise while traveling or when you have been sitting or standing for a long period of time. It is very important to exercise. Exercise your legs by walking or by tightening and relaxing your leg muscles often. Take frequent walks. °· Wear compression stockings as told by your health care provider to help prevent more blood clots from forming. °· Do not use tobacco products, including cigarettes, chewing tobacco, and e-cigarettes. If you need help quitting, ask your health care provider. °· Keep all follow-up appointments with your health care provider. This is important. °PREVENTION °Take these actions to decrease your risk of developing another DVT: °· Exercise regularly. For at least 30 minutes every day, engage in: °¨ Activity that involves moving your arms and legs. °¨ Activity that encourages good blood flow through your body by increasing your heart rate. °· Exercise your arms and legs every hour during long-distance travel (over 4 hours). Drink plenty of water and avoid drinking alcohol while traveling. °· Avoid sitting or lying in bed for long periods of time without moving your legs. °· Maintain a weight that is appropriate for your height. Ask your health care provider what weight is healthy for you. °· If you are a woman who is over 35 years of age, avoid unnecessary use of medicines that contain estrogen. These include birth control pills. °· Do not smoke, especially if you take estrogen medicines. If you need help quitting, ask your health care provider. °If you are hospitalized, prevention measures may include: °· Early walking after surgery, as soon as your health care provider says that it is safe. °· Receiving anticoagulants to prevent blood clots. If you cannot take  anticoagulants, other options may be available, such as wearing compression stockings or using different types of devices. °SEEK IMMEDIATE MEDICAL CARE IF: °· You have new or increased pain, swelling, or redness in an arm or leg. °· You have numbness or tingling in an arm or leg. °· You have shortness of breath while active or at rest. °· You have chest pain. °· You have a rapid or irregular heartbeat. °· You feel light-headed or dizzy. °· You cough up blood. °· You notice blood in your vomit, bowel movement, or urine. °These symptoms may represent a serious problem that is an emergency. Do not wait to see if the symptoms will go away. Get medical help right away. Call your local emergency services (911 in the [place] Do not drive yourself to the hospital. °  °  This information is not intended to replace advice given to you by your health care provider. Make sure you discuss any questions you have with your health care provider. °  °Document Released: 07/26/2005 Document Revised: 04/16/2015 Document Reviewed: 11/20/2014 °Elsevier Interactive Patient Education ©2016 [employer] ° °

## 2016-02-14 NOTE — ED Notes (Signed)
RN attempted to call report to [facility] with no answer at this time

## 2016-02-14 NOTE — ED Notes (Addendum)
Pt to ED from [facility] via EMS for known DVT to RT leg after having positive ultrasound at facility. Pt and family prefers treatment to be done in ED instead of facility. Pt A&O . Pt denies any CP or SOB

## 2016-02-20 LAB — ACID FAST SMEAR (AFB, MYCOBACTERIA): Acid Fast Smear: NEGATIVE

## 2016-02-26 DIAGNOSIS — E785 Hyperlipidemia, unspecified: Secondary | ICD-10-CM | POA: Diagnosis not present

## 2016-02-26 DIAGNOSIS — I2510 Atherosclerotic heart disease of native coronary artery without angina pectoris: Secondary | ICD-10-CM | POA: Diagnosis not present

## 2016-02-26 DIAGNOSIS — I10 Essential (primary) hypertension: Secondary | ICD-10-CM | POA: Diagnosis not present

## 2016-02-26 DIAGNOSIS — K219 Gastro-esophageal reflux disease without esophagitis: Secondary | ICD-10-CM | POA: Diagnosis not present

## 2016-03-08 DIAGNOSIS — I80291 Phlebitis and thrombophlebitis of other deep vessels of right lower extremity: Secondary | ICD-10-CM | POA: Diagnosis not present

## 2016-03-08 DIAGNOSIS — I10 Essential (primary) hypertension: Secondary | ICD-10-CM | POA: Diagnosis not present

## 2016-03-08 DIAGNOSIS — E785 Hyperlipidemia, unspecified: Secondary | ICD-10-CM | POA: Diagnosis not present

## 2016-03-08 DIAGNOSIS — I2510 Atherosclerotic heart disease of native coronary artery without angina pectoris: Secondary | ICD-10-CM | POA: Diagnosis not present

## 2016-03-09 DIAGNOSIS — R29898 Other symptoms and signs involving the musculoskeletal system: Secondary | ICD-10-CM | POA: Diagnosis not present

## 2016-03-09 DIAGNOSIS — M6281 Muscle weakness (generalized): Secondary | ICD-10-CM | POA: Diagnosis not present

## 2016-03-09 DIAGNOSIS — R531 Weakness: Secondary | ICD-10-CM | POA: Diagnosis not present

## 2016-03-09 DIAGNOSIS — Z21 Asymptomatic human immunodeficiency virus [HIV] infection status: Secondary | ICD-10-CM | POA: Diagnosis not present

## 2016-03-11 LAB — ACID FAST CULTURE WITH REFLEXED SENSITIVITIES (MYCOBACTERIA): Acid Fast Culture: NEGATIVE

## 2016-03-12 DIAGNOSIS — R0782 Intercostal pain: Secondary | ICD-10-CM | POA: Diagnosis not present

## 2016-03-12 DIAGNOSIS — K219 Gastro-esophageal reflux disease without esophagitis: Secondary | ICD-10-CM | POA: Diagnosis not present

## 2016-03-12 DIAGNOSIS — E785 Hyperlipidemia, unspecified: Secondary | ICD-10-CM | POA: Diagnosis not present

## 2016-03-12 DIAGNOSIS — I739 Peripheral vascular disease, unspecified: Secondary | ICD-10-CM | POA: Diagnosis not present

## 2016-03-12 DIAGNOSIS — I10 Essential (primary) hypertension: Secondary | ICD-10-CM | POA: Diagnosis not present

## 2016-03-12 DIAGNOSIS — I2510 Atherosclerotic heart disease of native coronary artery without angina pectoris: Secondary | ICD-10-CM | POA: Diagnosis not present

## 2016-03-15 ENCOUNTER — Inpatient Hospital Stay
Admission: EM | Admit: 2016-03-15 | Discharge: 2016-03-17 | DRG: 167 | Disposition: A | Discharge status: Skilled Nursing Facility | Payer: Medicare Other | Attending: Internal Medicine | Admitting: Internal Medicine

## 2016-03-15 DIAGNOSIS — E119 Type 2 diabetes mellitus without complications: Secondary | ICD-10-CM | POA: Diagnosis present

## 2016-03-15 DIAGNOSIS — I82409 Acute embolism and thrombosis of unspecified deep veins of unspecified lower extremity: Secondary | ICD-10-CM | POA: Diagnosis not present

## 2016-03-15 DIAGNOSIS — I82502 Chronic embolism and thrombosis of unspecified deep veins of left lower extremity: Secondary | ICD-10-CM | POA: Diagnosis not present

## 2016-03-15 DIAGNOSIS — Z791 Long term (current) use of non-steroidal anti-inflammatories (NSAID): Secondary | ICD-10-CM

## 2016-03-15 DIAGNOSIS — I252 Old myocardial infarction: Secondary | ICD-10-CM | POA: Diagnosis not present

## 2016-03-15 DIAGNOSIS — F419 Anxiety disorder, unspecified: Secondary | ICD-10-CM | POA: Diagnosis not present

## 2016-03-15 DIAGNOSIS — I2609 Other pulmonary embolism with acute cor pulmonale: Secondary | ICD-10-CM | POA: Diagnosis not present

## 2016-03-15 DIAGNOSIS — Z7901 Long term (current) use of anticoagulants: Secondary | ICD-10-CM | POA: Diagnosis not present

## 2016-03-15 DIAGNOSIS — Z880 Allergy status to penicillin: Secondary | ICD-10-CM | POA: Diagnosis not present

## 2016-03-15 DIAGNOSIS — J449 Chronic obstructive pulmonary disease, unspecified: Secondary | ICD-10-CM | POA: Diagnosis present

## 2016-03-15 DIAGNOSIS — B20 Human immunodeficiency virus [HIV] disease: Secondary | ICD-10-CM | POA: Diagnosis not present

## 2016-03-15 DIAGNOSIS — M79605 Pain in left leg: Secondary | ICD-10-CM | POA: Diagnosis not present

## 2016-03-15 DIAGNOSIS — I82491 Acute embolism and thrombosis of other specified deep vein of right lower extremity: Secondary | ICD-10-CM | POA: Diagnosis not present

## 2016-03-15 DIAGNOSIS — Z888 Allergy status to other drugs, medicaments and biological substances status: Secondary | ICD-10-CM

## 2016-03-15 DIAGNOSIS — N189 Chronic kidney disease, unspecified: Secondary | ICD-10-CM | POA: Diagnosis not present

## 2016-03-15 DIAGNOSIS — E876 Hypokalemia: Secondary | ICD-10-CM

## 2016-03-15 DIAGNOSIS — I2699 Other pulmonary embolism without acute cor pulmonale: Secondary | ICD-10-CM | POA: Diagnosis not present

## 2016-03-15 DIAGNOSIS — G720 Drug-induced myopathy: Secondary | ICD-10-CM | POA: Diagnosis not present

## 2016-03-15 DIAGNOSIS — D649 Anemia, unspecified: Secondary | ICD-10-CM | POA: Diagnosis not present

## 2016-03-15 DIAGNOSIS — G4700 Insomnia, unspecified: Secondary | ICD-10-CM | POA: Diagnosis not present

## 2016-03-15 DIAGNOSIS — Z91030 Bee allergy status: Secondary | ICD-10-CM | POA: Diagnosis not present

## 2016-03-15 DIAGNOSIS — R269 Unspecified abnormalities of gait and mobility: Secondary | ICD-10-CM | POA: Diagnosis not present

## 2016-03-15 DIAGNOSIS — K589 Irritable bowel syndrome without diarrhea: Secondary | ICD-10-CM | POA: Diagnosis not present

## 2016-03-15 DIAGNOSIS — F339 Major depressive disorder, recurrent, unspecified: Secondary | ICD-10-CM | POA: Diagnosis not present

## 2016-03-15 DIAGNOSIS — E0590 Thyrotoxicosis, unspecified without thyrotoxic crisis or storm: Secondary | ICD-10-CM | POA: Diagnosis not present

## 2016-03-15 DIAGNOSIS — Z7401 Bed confinement status: Secondary | ICD-10-CM | POA: Diagnosis not present

## 2016-03-15 DIAGNOSIS — Z95828 Presence of other vascular implants and grafts: Secondary | ICD-10-CM

## 2016-03-15 DIAGNOSIS — Z8249 Family history of ischemic heart disease and other diseases of the circulatory system: Secondary | ICD-10-CM

## 2016-03-15 DIAGNOSIS — I10 Essential (primary) hypertension: Secondary | ICD-10-CM | POA: Diagnosis present

## 2016-03-15 DIAGNOSIS — I82501 Chronic embolism and thrombosis of unspecified deep veins of right lower extremity: Secondary | ICD-10-CM | POA: Diagnosis present

## 2016-03-15 DIAGNOSIS — M6281 Muscle weakness (generalized): Secondary | ICD-10-CM | POA: Diagnosis not present

## 2016-03-15 DIAGNOSIS — K219 Gastro-esophageal reflux disease without esophagitis: Secondary | ICD-10-CM | POA: Diagnosis not present

## 2016-03-15 DIAGNOSIS — R0602 Shortness of breath: Secondary | ICD-10-CM | POA: Diagnosis not present

## 2016-03-15 DIAGNOSIS — R531 Weakness: Secondary | ICD-10-CM | POA: Diagnosis not present

## 2016-03-15 DIAGNOSIS — R079 Chest pain, unspecified: Secondary | ICD-10-CM | POA: Diagnosis not present

## 2016-03-15 DIAGNOSIS — Z21 Asymptomatic human immunodeficiency virus [HIV] infection status: Secondary | ICD-10-CM | POA: Diagnosis present

## 2016-03-15 DIAGNOSIS — Z87891 Personal history of nicotine dependence: Secondary | ICD-10-CM | POA: Diagnosis not present

## 2016-03-15 DIAGNOSIS — Z886 Allergy status to analgesic agent status: Secondary | ICD-10-CM

## 2016-03-15 DIAGNOSIS — Z741 Need for assistance with personal care: Secondary | ICD-10-CM | POA: Diagnosis not present

## 2016-03-15 DIAGNOSIS — R6889 Other general symptoms and signs: Secondary | ICD-10-CM | POA: Diagnosis not present

## 2016-03-15 DIAGNOSIS — I2510 Atherosclerotic heart disease of native coronary artery without angina pectoris: Secondary | ICD-10-CM | POA: Diagnosis present

## 2016-03-15 DIAGNOSIS — Z882 Allergy status to sulfonamides status: Secondary | ICD-10-CM

## 2016-03-15 DIAGNOSIS — I82401 Acute embolism and thrombosis of unspecified deep veins of right lower extremity: Secondary | ICD-10-CM

## 2016-03-15 DIAGNOSIS — Z79899 Other long term (current) drug therapy: Secondary | ICD-10-CM

## 2016-03-15 DIAGNOSIS — R609 Edema, unspecified: Secondary | ICD-10-CM | POA: Diagnosis not present

## 2016-03-15 DIAGNOSIS — I82509 Chronic embolism and thrombosis of unspecified deep veins of unspecified lower extremity: Secondary | ICD-10-CM | POA: Diagnosis not present

## 2016-03-15 DIAGNOSIS — E785 Hyperlipidemia, unspecified: Secondary | ICD-10-CM | POA: Diagnosis not present

## 2016-03-15 DIAGNOSIS — I739 Peripheral vascular disease, unspecified: Secondary | ICD-10-CM | POA: Diagnosis not present

## 2016-03-15 DIAGNOSIS — R41841 Cognitive communication deficit: Secondary | ICD-10-CM | POA: Diagnosis not present

## 2016-03-15 LAB — TROPONIN I

## 2016-03-15 LAB — COMPREHENSIVE METABOLIC PANEL
ALK PHOS: 162 U/L — AB (ref 38–126)
ALT: 18 U/L (ref 17–63)
ANION GAP: 6 (ref 5–15)
AST: 37 U/L (ref 15–41)
Albumin: 2.3 g/dL — ABNORMAL LOW (ref 3.5–5.0)
BILIRUBIN TOTAL: 0.4 mg/dL (ref 0.3–1.2)
BUN: 7 mg/dL (ref 6–20)
CALCIUM: 8.6 mg/dL — AB (ref 8.9–10.3)
CO2: 26 mmol/L (ref 22–32)
CREATININE: 0.89 mg/dL (ref 0.61–1.24)
Chloride: 108 mmol/L (ref 101–111)
Glucose, Bld: 121 mg/dL — ABNORMAL HIGH (ref 65–99)
Potassium: 3.6 mmol/L (ref 3.5–5.1)
SODIUM: 140 mmol/L (ref 135–145)
TOTAL PROTEIN: 6.4 g/dL — AB (ref 6.5–8.1)

## 2016-03-15 LAB — CBC
HCT: 36.3 % — ABNORMAL LOW (ref 40.0–52.0)
HEMOGLOBIN: 12.2 g/dL — AB (ref 13.0–18.0)
MCH: 33.9 pg (ref 26.0–34.0)
MCHC: 33.6 g/dL (ref 32.0–36.0)
MCV: 100.9 fL — AB (ref 80.0–100.0)
Platelets: 384 10*3/uL (ref 150–440)
RBC: 3.59 MIL/uL — AB (ref 4.40–5.90)
RDW: 17.4 % — ABNORMAL HIGH (ref 11.5–14.5)
WBC: 5.8 10*3/uL (ref 3.8–10.6)

## 2016-03-15 LAB — PROTIME-INR
INR: 1.42
PROTHROMBIN TIME: 17.5 s — AB (ref 11.4–15.2)

## 2016-03-15 LAB — GLUCOSE, CAPILLARY: Glucose-Capillary: 94 mg/dL (ref 65–99)

## 2016-03-15 LAB — APTT: APTT: 33 s (ref 24–36)

## 2016-03-15 LAB — MRSA PCR SCREENING: MRSA BY PCR: NEGATIVE

## 2016-03-15 LAB — HEPARIN LEVEL (UNFRACTIONATED): HEPARIN UNFRACTIONATED: 1.8 [IU]/mL — AB (ref 0.30–0.70)

## 2016-03-15 LAB — BRAIN NATRIURETIC PEPTIDE: B NATRIURETIC PEPTIDE 5: 109 pg/mL — AB (ref 0.0–100.0)

## 2016-03-15 MED ORDER — ZOLPIDEM TARTRATE 5 MG PO TABS: 5.0000 mg | ORAL_TABLET | Freq: Every evening | ORAL | Status: DC | PRN

## 2016-03-15 MED ORDER — LINACLOTIDE 290 MCG PO CAPS
290.0000 ug | ORAL_CAPSULE | Freq: Every day | ORAL | Status: DC
  Administered 2016-03-16 – 2016-03-17 (×2): 290 ug via ORAL
  Filled 2016-03-15 (×2): qty 1

## 2016-03-15 MED ORDER — NITROGLYCERIN 0.4 MG SL SUBL: 0.4000 mg | SUBLINGUAL_TABLET | SUBLINGUAL | Status: DC | PRN

## 2016-03-15 MED ORDER — TIOTROPIUM BROMIDE MONOHYDRATE 18 MCG IN CAPS
18.0000 ug | ORAL_CAPSULE | Freq: Every day | RESPIRATORY_TRACT | Status: DC
  Administered 2016-03-15 – 2016-03-17 (×3): 18 ug via RESPIRATORY_TRACT
  Filled 2016-03-15: qty 5

## 2016-03-15 MED ORDER — SODIUM CHLORIDE 0.9% FLUSH
3.0000 mL | Freq: Two times a day (BID) | INTRAVENOUS | Status: DC
  Administered 2016-03-15 – 2016-03-17 (×4): 3 mL via INTRAVENOUS

## 2016-03-15 MED ORDER — ISOSORBIDE MONONITRATE ER 30 MG PO TB24
30.0000 mg | ORAL_TABLET | Freq: Every day | ORAL | Status: DC
  Administered 2016-03-15 – 2016-03-17 (×3): 30 mg via ORAL
  Filled 2016-03-15 (×3): qty 1

## 2016-03-15 MED ORDER — MIRTAZAPINE 15 MG PO TABS
30.0000 mg | ORAL_TABLET | Freq: Every day | ORAL | Status: DC
  Administered 2016-03-15 – 2016-03-16 (×2): 30 mg via ORAL
  Filled 2016-03-15 (×2): qty 2

## 2016-03-15 MED ORDER — MOMETASONE FURO-FORMOTEROL FUM 200-5 MCG/ACT IN AERO
2.0000 | INHALATION_SPRAY | Freq: Two times a day (BID) | RESPIRATORY_TRACT | Status: DC
  Administered 2016-03-15 – 2016-03-17 (×4): 2 via RESPIRATORY_TRACT
  Filled 2016-03-15: qty 8.8

## 2016-03-15 MED ORDER — HYDROCODONE-ACETAMINOPHEN 5-325 MG PO TABS: 1.0000 | ORAL_TABLET | ORAL | Status: DC | PRN

## 2016-03-15 MED ORDER — METHIMAZOLE 10 MG PO TABS
10.0000 mg | ORAL_TABLET | Freq: Every day | ORAL | Status: DC
  Administered 2016-03-16 – 2016-03-17 (×2): 10 mg via ORAL
  Filled 2016-03-15 (×4): qty 1

## 2016-03-15 MED ORDER — ABACAVIR-DOLUTEGRAVIR-LAMIVUD 600-50-300 MG PO TABS
1.0000 | ORAL_TABLET | Freq: Every day | ORAL | Status: DC
  Administered 2016-03-15 – 2016-03-17 (×3): 1 via ORAL
  Filled 2016-03-15 (×3): qty 1

## 2016-03-15 MED ORDER — ALBUTEROL SULFATE (2.5 MG/3ML) 0.083% IN NEBU: 2.5000 mg | INHALATION_SOLUTION | Freq: Four times a day (QID) | RESPIRATORY_TRACT | Status: DC | PRN

## 2016-03-15 MED ORDER — ADULT MULTIVITAMIN W/MINERALS CH
1.0000 | ORAL_TABLET | Freq: Every day | ORAL | Status: DC
  Administered 2016-03-15 – 2016-03-17 (×3): 1 via ORAL
  Filled 2016-03-15 (×3): qty 1

## 2016-03-15 MED ORDER — ACETAMINOPHEN 325 MG PO TABS: 650.0000 mg | ORAL_TABLET | Freq: Four times a day (QID) | ORAL | Status: DC | PRN

## 2016-03-15 MED ORDER — ATORVASTATIN CALCIUM 20 MG PO TABS
40.0000 mg | ORAL_TABLET | Freq: Every day | ORAL | Status: DC
  Administered 2016-03-15 – 2016-03-16 (×2): 40 mg via ORAL
  Filled 2016-03-15 (×2): qty 2

## 2016-03-15 MED ORDER — LORATADINE 10 MG PO TABS
10.0000 mg | ORAL_TABLET | Freq: Every day | ORAL | Status: DC
  Administered 2016-03-15 – 2016-03-17 (×3): 10 mg via ORAL
  Filled 2016-03-15 (×3): qty 1

## 2016-03-15 MED ORDER — ESCITALOPRAM OXALATE 10 MG PO TABS
20.0000 mg | ORAL_TABLET | Freq: Every day | ORAL | Status: DC
  Administered 2016-03-15 – 2016-03-17 (×3): 20 mg via ORAL
  Filled 2016-03-15 (×3): qty 2

## 2016-03-15 MED ORDER — SENNOSIDES-DOCUSATE SODIUM 8.6-50 MG PO TABS: 1.0000 | ORAL_TABLET | Freq: Every evening | ORAL | Status: DC | PRN

## 2016-03-15 MED ORDER — CARVEDILOL 12.5 MG PO TABS
12.5000 mg | ORAL_TABLET | Freq: Two times a day (BID) | ORAL | Status: DC
  Administered 2016-03-15 – 2016-03-17 (×4): 12.5 mg via ORAL
  Filled 2016-03-15: qty 1
  Filled 2016-03-15: qty 2
  Filled 2016-03-15: qty 1
  Filled 2016-03-15: qty 2

## 2016-03-15 MED ORDER — PANTOPRAZOLE SODIUM 40 MG PO TBEC
40.0000 mg | DELAYED_RELEASE_TABLET | Freq: Every day | ORAL | Status: DC
  Administered 2016-03-15 – 2016-03-17 (×3): 40 mg via ORAL
  Filled 2016-03-15 (×3): qty 1

## 2016-03-15 MED ORDER — HEPARIN (PORCINE) IN NACL 100-0.45 UNIT/ML-% IJ SOLN
1000.0000 [IU]/h | INTRAMUSCULAR | Status: DC
  Administered 2016-03-15: 20:00:00 1100 [IU]/h via INTRAVENOUS
  Filled 2016-03-15 (×2): qty 250

## 2016-03-15 MED ORDER — MELOXICAM 7.5 MG PO TABS
7.5000 mg | ORAL_TABLET | Freq: Two times a day (BID) | ORAL | Status: DC
  Administered 2016-03-15 – 2016-03-16 (×2): 7.5 mg via ORAL
  Filled 2016-03-15 (×2): qty 1

## 2016-03-15 MED ORDER — SODIUM CHLORIDE 0.9% FLUSH: 3.0000 mL | INTRAVENOUS | Status: DC | PRN

## 2016-03-15 MED ORDER — ONDANSETRON HCL 4 MG/2ML IJ SOLN: 4.0000 mg | Freq: Four times a day (QID) | INTRAMUSCULAR | Status: DC | PRN

## 2016-03-15 MED ORDER — INSULIN ASPART 100 UNIT/ML ~~LOC~~ SOLN
0.0000 [IU] | Freq: Three times a day (TID) | SUBCUTANEOUS | Status: DC
  Administered 2016-03-17: 12:00:00 1 [IU] via SUBCUTANEOUS
  Filled 2016-03-15: qty 1

## 2016-03-15 MED ORDER — SODIUM CHLORIDE 0.9 % IV SOLN: 250.0000 mL | INTRAVENOUS | Status: DC | PRN

## 2016-03-15 MED ORDER — CALCIUM CARBONATE-VITAMIN D 500-200 MG-UNIT PO TABS
1.0000 | ORAL_TABLET | Freq: Two times a day (BID) | ORAL | Status: DC
  Administered 2016-03-15 – 2016-03-17 (×4): 1 via ORAL
  Filled 2016-03-15 (×4): qty 1

## 2016-03-15 MED ORDER — TRAMADOL HCL 50 MG PO TABS
50.0000 mg | ORAL_TABLET | Freq: Two times a day (BID) | ORAL | Status: DC
  Administered 2016-03-15 – 2016-03-17 (×4): 50 mg via ORAL
  Filled 2016-03-15 (×4): qty 1

## 2016-03-15 MED ORDER — CLONAZEPAM 0.5 MG PO TABS: 0.5000 mg | ORAL_TABLET | Freq: Two times a day (BID) | ORAL | Status: DC | PRN

## 2016-03-15 MED ORDER — FLUTICASONE PROPIONATE 50 MCG/ACT NA SUSP
1.0000 | Freq: Every day | NASAL | Status: DC
  Administered 2016-03-15 – 2016-03-17 (×3): 1 via NASAL
  Filled 2016-03-15: qty 16

## 2016-03-15 MED ORDER — SODIUM CHLORIDE 0.9% FLUSH
3.0000 mL | Freq: Two times a day (BID) | INTRAVENOUS | Status: DC
  Administered 2016-03-15 – 2016-03-17 (×4): 3 mL via INTRAVENOUS

## 2016-03-15 MED ORDER — ACETAMINOPHEN 650 MG RE SUPP: 650.0000 mg | Freq: Four times a day (QID) | RECTAL | Status: DC | PRN

## 2016-03-15 MED ORDER — ONDANSETRON HCL 4 MG PO TABS: 4.0000 mg | ORAL_TABLET | Freq: Four times a day (QID) | ORAL | Status: DC | PRN

## 2016-03-15 NOTE — Consult Note (Signed)
ANTICOAGULATION CONSULT NOTE - Initial Consult  Pharmacy Consult for heparin drip Indication: pulmonary embolus  Allergies  Allergen Reactions  . Aspirin Anaphylaxis  . Bee Venom Anaphylaxis  . Penicillins Anaphylaxis and Other (See Comments)    Has patient had a PCN reaction causing immediate rash, facial/tongue/throat swelling, SOB or lightheadedness with hypotension: Yes Has patient had a PCN reaction causing severe rash involving mucus membranes or skin necrosis: No Has patient had a PCN reaction that required hospitalization No Has patient had a PCN reaction occurring within the last 10 years: Yes If all of the above answers are "NO", then may proceed with Cephalosporin use.  . Prednisone Anaphylaxis  . Sulfa Antibiotics Anaphylaxis  . Sulfasalazine Anaphylaxis  . Theophyllines Anaphylaxis  . Theophylline Swelling    Patient Measurements: Height:  (165.1 cm) Weight: 145 lb (65.8 kg) IBW/kg (Calculated) : 61.5 Heparin Dosing Weight: 65.8kg  Vital Signs: Temp: 98.7 F (37.1 C) (08/07 1431) Temp Source: Oral (08/07 1431) BP: 122/79 (08/07 1731) Pulse Rate: 99 (08/07 1731)  Labs:  Recent Labs  03/15/16 1549 03/15/16 1728  HGB 12.2*  --   HCT 36.3*  --   PLT 384  --   APTT  --  33  LABPROT  --  17.5*  INR  --  1.42  CREATININE 0.89  --   TROPONINI <0.03  --     Estimated Creatinine Clearance: 78.7 mL/min (by C-G formula based on SCr of 0.89 mg/dL).   Medical History: Past Medical History:  Diagnosis Date  . Anemia   . Asthma   . Collagen vascular disease (HCC)   . COPD (chronic obstructive pulmonary disease) (HCC)   . Coronary artery disease   . Emphysema/COPD (HCC)   . HIV (human immunodeficiency virus infection) (HCC)   . Hypertension   . Lung mass   . Myocardial infarction (HCC)    in 2000  . Type 2 diabetes mellitus (HCC)     Medications:  Scheduled:  . abacavir-dolutegravir-lamiVUDine  1 tablet Oral Daily  . atorvastatin  40 mg Oral  QHS  . Calcium Carbonate-Vitamin D  1 tablet Oral BID  . carvedilol  12.5 mg Oral BID WC  . escitalopram  20 mg Oral Daily  . fluticasone  1 spray Each Nare Daily  . isosorbide mononitrate  30 mg Oral Daily  . [START ON 03/16/2016] linaclotide  290 mcg Oral QAC breakfast  . loratadine  10 mg Oral Daily  . meloxicam  7.5 mg Oral BID  . methimazole  10 mg Oral Daily  . mirtazapine  30 mg Oral QHS  . mometasone-formoterol  2 puff Inhalation BID  . multivitamin with minerals  1 tablet Oral Daily  . pantoprazole  40 mg Oral Daily  . sodium chloride flush  3 mL Intravenous Q12H  . sodium chloride flush  3 mL Intravenous Q12H  . tiotropium  18 mcg Inhalation Daily  . traMADol  50 mg Oral BID    Assessment: Pt is a 60 year old male with a hx of DVT, currently on apixaban, found to have a PE. Per pt, dose of eliquis was recently increased to  BID. Per patient last dose of apixaban ( ) was this morning around 0900. Spoke with Dr. Allena Katz, due to the acute nature of the PE, would like to start heparin drip now as opposed to 12 hr after last dose. Therefore will not bolus. Baseline APTT, INR, CBC and heparin level have been ordered and are in progress.  Goal of Therapy:  Heparin level 0.3-0.7 units/ml Monitor platelets by anticoagulation protocol: Yes   Plan:  Start heparin infusion at 1100 units/hr Check anti-Xa level in 6 hours and daily while on heparin Continue to monitor H&H and platelets  Will most likely need to dose based off of APTT until HL and APTT correlate. Follow up on baseline HL.  Melissa D Maccia 03/15/2016,5:49 PM

## 2016-03-15 NOTE — ED Triage Notes (Signed)
Pt comes into the ED via EMS from [facility] with known DVT in right LE and now dx with PE.Brian Hampton. Pt denies any chest pain or SOB, states he is having 10/10 pain in the RLE

## 2016-03-15 NOTE — H&P (Signed)
[facility] - [place] at [facility]   PATIENT NAME: Brian Hampton    MR#:  [id number]  DATE OF BIRTH:  [birth year]  DATE OF ADMISSION:  03/15/2016  PRIMARY CARE PHYSICIAN: [facility]   REQUESTING/REFERRING PHYSICIAN: Melvenia Needles  CHIEF COMPLAINT:   Chief Complaint  Patient presents with  . Shortness of Breath    HISTORY OF PRESENT ILLNESS: Brian Hampton  is a 60 y.o. male with a known history of DVT that was diagnosed 2 months ago, was on liquids for that, anemia, COPD, coronary artery disease, HIV, hypertension and type 2 diabetes who was seen by his primary pulmonologist, for complaint of having chest pain with deep breath as well as worsening right lower extremity swelling. Due to this, she was sent for evaluation with a CT scan of the lungs. The CT of the chest showed pulmonary emboli involving the left lower lobe segmental branch and subsegmental vessels. His lower extremity Doppler showed diffuse thrombus formation throughout the right deep venous system, femoral, radial involving complete popliteal vein area. Due to this he was sent to the ED. In the ER. The ER physician discussed the case with Dr. Kenard Gower, who will evaluate the patient for his DVT. He recommended IV heparin therapy. Patient otherwise denies any fevers, chills, no nausea, vomiting or diarrhea PAST MEDICAL HISTORY:   Past Medical History:  Diagnosis Date  . Anemia   . Asthma   . Collagen vascular disease (HCC)   . COPD (chronic obstructive pulmonary disease) (HCC)   . Coronary artery disease   . Emphysema/COPD (HCC)   . HIV (human immunodeficiency virus infection) (HCC)   . Hypertension   . Lung mass   . Myocardial infarction (HCC)    in 2000  . Type 2 diabetes mellitus (HCC)     PAST SURGICAL HISTORY: History reviewed. No pertinent surgical history.  SOCIAL HISTORY:  Social History  Substance Use Topics  . Smoking status: Former Games developer  . Smokeless tobacco:  Never Used  . Alcohol use No    FAMILY HISTORY:  Family History  Problem Relation Age of Onset  . CAD      DRUG ALLERGIES:  Allergies  Allergen Reactions  . Aspirin Anaphylaxis  . Bee Venom Anaphylaxis  . Penicillins Anaphylaxis and Other (See Comments)    Has patient had a PCN reaction causing immediate rash, facial/tongue/throat swelling, SOB or lightheadedness with hypotension: Yes Has patient had a PCN reaction causing severe rash involving mucus membranes or skin necrosis: No Has patient had a PCN reaction that required hospitalization No Has patient had a PCN reaction occurring within the last 10 years: Yes If all of the above answers are "NO", then may proceed with Cephalosporin use.  . Prednisone Anaphylaxis  . Sulfa Antibiotics Anaphylaxis  . Sulfasalazine Anaphylaxis  . Theophyllines Anaphylaxis  . Theophylline Swelling    REVIEW OF SYSTEMS:   CONSTITUTIONAL: No fever, fatigue or weakness.  EYES: No blurred or double vision.  EARS, NOSE, AND THROAT: No tinnitus or ear pain.  RESPIRATORY: No cough, positive shortness of breath, wheezing or hemoptysis.  CARDIOVASCULAR: No chest pain, orthopnea, edema.  GASTROINTESTINAL: No nausea, vomiting, diarrhea or abdominal pain.  GENITOURINARY: No dysuria, hematuria.  ENDOCRINE: No polyuria, nocturia,  HEMATOLOGY: No anemia, easy bruising or bleeding SKIN: No rash or lesion. Positive lower extremity swelling MUSCULOSKELETAL: No joint pain or arthritis.   NEUROLOGIC: No tingling, numbness, weakness.  PSYCHIATRY: No anxiety or depression.   MEDICATIONS AT  HOME:  Prior to Admission medications   Medication Sig Start Date End Date Taking? Authorizing Provider  abacavir-dolutegravir-lamiVUDine (TRIUMEQ) 600-50-300 MG tablet Take 1 tablet by mouth daily. Reported on 01/30/2016 01/27/16   Historical Provider, MD  albuterol (PROVENTIL HFA;VENTOLIN HFA) 108 (90 BASE) MCG/ACT inhaler Inhale 2 puffs into the lungs every 6 (six) hours  as needed for wheezing or shortness of breath.    Historical Provider, MD  albuterol (PROVENTIL) (2.5 MG/3ML) 0.083% nebulizer solution Take 2.5 mg by nebulization every 6 (six) hours as needed for wheezing or shortness of breath.    Historical Provider, MD  apixaban (ELIQUIS) 5 MG TABS tablet Treatment: 10 mg twice daily for 7 days followed by 5 mg twice daily 02/14/16   Emily FilbertJonathan E Williams, MD  atorvastatin (LIPITOR) 40 MG tablet Take 40 mg by mouth at bedtime.    Historical Provider, MD  Calcium Carbonate-Vitamin D (CALCIUM 600+D) 600-400 MG-UNIT tablet Take 1 tablet by mouth 2 (two) times daily.    Historical Provider, MD  carvedilol (COREG) 12.5 MG tablet Take 12.5 mg by mouth 2 (two) times daily with a meal.    Historical Provider, MD  cetirizine (ZYRTEC) 10 MG tablet Take 10 mg by mouth daily.    Historical Provider, MD  clonazePAM (KLONOPIN) 0.5 MG tablet Take 1 tablet (0.5 mg total) by mouth 2 (two) times daily as needed for anxiety. 01/31/16   Katharina Caperima Vaickute, MD  EPINEPHrine (EPIPEN 2-PAK) 0.3 mg/0.3 mL IJ SOAJ injection Inject 0.3 mg into the muscle once as needed (for severe allergic reaction).    Historical Provider, MD  escitalopram (LEXAPRO) 20 MG tablet Take 20 mg by mouth daily.    Historical Provider, MD  fluconazole (DIFLUCAN) 200 MG tablet Take 200 mg by mouth daily.    Historical Provider, MD  fluticasone (FLONASE) 50 MCG/ACT nasal spray Place 1 spray into both nostrils daily.    Historical Provider, MD  gemfibrozil (LOPID) 600 MG tablet Take 600 mg by mouth 2 (two) times daily before a meal.    Historical Provider, MD  isosorbide mononitrate (IMDUR) 30 MG 24 hr tablet Take 30 mg by mouth daily.    Historical Provider, MD  linaclotide (LINZESS) 290 MCG CAPS capsule Take 290 mcg by mouth daily before breakfast.    Historical Provider, MD  meloxicam (MOBIC) 7.5 MG tablet Take 7.5 mg by mouth 2 (two) times daily.    Historical Provider, MD  methimazole (TAPAZOLE) 10 MG tablet Take 1  tablet (10 mg total) by mouth daily. 02/02/16   Katharina Caperima Vaickute, MD  mirtazapine (REMERON) 30 MG tablet Take 30 mg by mouth at bedtime.    Historical Provider, MD  mometasone-formoterol (DULERA) 200-5 MCG/ACT AERO Inhale 2 puffs into the lungs 2 (two) times daily.     Historical Provider, MD  Multiple Vitamin (MULTIVITAMIN WITH MINERALS) TABS tablet Take 1 tablet by mouth daily.    Historical Provider, MD  nitroGLYCERIN (NITROSTAT) 0.4 MG SL tablet Place 0.4 mg under the tongue every 5 (five) minutes as needed for chest pain.    Historical Provider, MD  omeprazole (PRILOSEC) 20 MG capsule Take 20 mg by mouth 2 (two) times daily before a meal.    Historical Provider, MD  senna-docusate (SENOKOT-S) 8.6-50 MG tablet Take 1 tablet by mouth at bedtime as needed for mild constipation. 01/31/16   Katharina Caperima Vaickute, MD  tiotropium (SPIRIVA) 18 MCG inhalation capsule Place 18 mcg into inhaler and inhale daily.    Historical Provider, MD  traMADol (  ULTRAM) 50 MG tablet Take 1 tablet (50 mg total) by mouth 2 (two) times daily. 01/31/16   Katharina Caper, MD  zolpidem (AMBIEN CR) 12.5 MG CR tablet Take 1 tablet (12.5 mg total) by mouth at bedtime as needed for sleep. 01/31/16   Katharina Caper, MD      PHYSICAL EXAMINATION:   VITAL SIGNS: Blood pressure 122/79, pulse 99, temperature 98.7 F (37.1 C), temperature source Oral, resp. rate 18, height 5\' 5"  (1.651 m), weight 65.8 kg (145 lb), SpO2 98 %.  GENERAL:  60 y.o.-year-old patient lying in the bed with no acute distress.  EYES: Pupils equal, round, reactive to light and accommodation. No scleral icterus. Extraocular muscles intact.  HEENT: Head atraumatic, normocephalic. Oropharynx and nasopharynx clear.  NECK:  Supple, no jugular venous distention. No thyroid enlargement, no tenderness.  LUNGS: Normal breath sounds bilaterally, no wheezing, rales,rhonchi or crepitation. No use of accessory muscles of respiration.  CARDIOVASCULAR: S1, S2 normal. No murmurs, rubs, or  gallops.  ABDOMEN: Soft, nontender, nondistended. Bowel sounds present. No organomegaly or mass.  EXTREMITIES:Right lower extremity is swollen, warm to touch NEUROLOGIC: Cranial nerves II through XII are intact. Muscle strength 5/5 in all extremities. Sensation intact. Gait not checked.  PSYCHIATRIC: The patient is alert and oriented x 3.  SKIN: No obvious rash, lesion, or ulcer.   LABORATORY PANEL:   CBC  Recent Labs Lab 03/15/16 1549  WBC 5.8  HGB 12.2*  HCT 36.3*  PLT 384  MCV 100.9*  MCH 33.9  MCHC 33.6  RDW 17.4*   ------------------------------------------------------------------------------------------------------------------  Chemistries   Recent Labs Lab 03/15/16 1549  NA 140  K 3.6  CL 108  CO2 26  GLUCOSE 121*  BUN 7  CREATININE 0.89  CALCIUM 8.6*  AST 37  ALT 18  ALKPHOS 162*  BILITOT 0.4   ------------------------------------------------------------------------------------------------------------------ estimated creatinine clearance is 78.7 mL/min (by C-G formula based on SCr of 0.89 mg/dL). ------------------------------------------------------------------------------------------------------------------ No results for input(s): TSH, T4TOTAL, T3FREE, THYROIDAB in the last 72 hours.  Invalid input(s): FREET3   Coagulation profile No results for input(s): INR, PROTIME in the last 168 hours. ------------------------------------------------------------------------------------------------------------------- No results for input(s): DDIMER in the last 72 hours. -------------------------------------------------------------------------------------------------------------------  Cardiac Enzymes  Recent Labs Lab 03/15/16 1549  TROPONINI <0.03   ------------------------------------------------------------------------------------------------------------------ Invalid input(s):  POCBNP  ---------------------------------------------------------------------------------------------------------------  Urinalysis    Component Value Date/Time   COLORURINE YELLOW (A) 01/31/2016 1129   APPEARANCEUR CLEAR (A) 01/31/2016 1129   LABSPEC 1.017 01/31/2016 1129   PHURINE 5.0 01/31/2016 1129   GLUCOSEU >500 (A) 01/31/2016 1129   HGBUR 3+ (A) 01/31/2016 1129   BILIRUBINUR NEGATIVE 01/31/2016 1129   KETONESUR NEGATIVE 01/31/2016 1129   PROTEINUR 30 (A) 01/31/2016 1129   NITRITE NEGATIVE 01/31/2016 1129   LEUKOCYTESUR NEGATIVE 01/31/2016 1129     RADIOLOGY: No results found.  EKG: Orders placed or performed during the hospital encounter of 03/15/16  . ED EKG  . ED EKG  . EKG 12-Lead  . EKG 12-Lead    IMPRESSION AND PLAN: Patient is a 60 year old with history of DVT on aloe quest therapy, presents with new pulmonary embolism  1. Pulmonary embolism Patient was started on IV heparin drip Vascular surgery consult will be obtained I will also ask hematology to see the patient to see if they feel aloe quest is at therapy failure or the DVT that was present. There was old and caused a pulmonary embolism  2. HIV We'll continue his HIV therapy  3. COPD Without any  evidence of acute exasperation Continued therapy with nebs when necessary and inhalers  4. Hypertension Continue therapy with Coreg  5. Coronary artery disease Continue Coreg and isosorbide mononitrate  6. Misc: heparin drip   All the records are reviewed and case discussed with ED provider. Management plans discussed with the patient, family and they are in agreement.  CODE STATUS:    Code Status Orders        Start     Ordered   03/15/16 1728  Full code  Continuous     03/15/16 1728    Code Status History    Date Active Date Inactive Code Status Order ID Comments User Context   01/30/2016 12:55 AM 02/03/2016 12:55 AM DNR [id number]  Gery Pray, MD Inpatient   01/13/2016  9:46 PM  01/16/2016  4:18 PM Full Code [id number]  Shaune Pollack, MD Inpatient   06/24/2015  7:13 AM 06/25/2015 12:27 PM Full Code [id number]  Milagros Loll, MD ED    Advance Directive Documentation   Flowsheet Row Most Recent Value  Type of Advance Directive  Healthcare Power of Attorney, Living will  Pre-existing out of facility DNR order (yellow form or pink MOST form)  No data  "MOST" Form in Place?  No data       TOTAL TIME TAKING CARE OF THIS PATIENT:55 minutes.    Auburn Bilberry M.D on 03/15/2016 at 5:33 PM  Between 7am to 6pm - Pager - [phone]  After 6pm go to [email or web address] - password [email or web address]  [facility]  Office  [phone]  CC: Primary care physician; [facility]

## 2016-03-15 NOTE — ED Notes (Signed)
AAOx3.  Skin warm and dry.  NAD 

## 2016-03-15 NOTE — ED Provider Notes (Addendum)
[facility] Emergency Department Provider Note  ____________________________________________   First MD Initiated Contact with Patient 03/15/16 1450     (approximate)  I have reviewed the triage vital signs and the nursing notes.   HISTORY  Chief Complaint Shortness of Breath   HPI Brian Hampton is a 60 y.o. male with a history of HIV on HAART therapy who is presenting to the emergency department today after being sent to the emergency department for a new pulmonary embolus. The patient was diagnosed with DVT about 1 month ago and has been on eliquis. Despite being on eliquis he developed shortness of breath with diffuse intermittent chest pain. He is denying any chest pain at this time but says that the chest pain worsens when he takes a deep breath. He then had an outpatient workup from his cardiologist who performed a ultrasound of the bilateral lower extremity looking for DVT as well as a CTA of the chest. Segmental PEs were found on the chest CTA but the lower extremity or sounds are clear. The patient was then sent to the emergency department for further workup and evaluation. He is denying any chest pain at this time. Denies any shortness of breath this time.   Past Medical History:  Diagnosis Date  . Anemia   . Asthma   . Collagen vascular disease (HCC)   . COPD (chronic obstructive pulmonary disease) (HCC)   . Coronary artery disease   . Emphysema/COPD (HCC)   . HIV (human immunodeficiency virus infection) (HCC)   . Hypertension   . Lung mass   . Myocardial infarction (HCC)    in 2000  . Type 2 diabetes mellitus [facility])     Patient Active Problem List   Diagnosis Date Noted  . Dysphagia 02/02/2016  . GERD (gastroesophageal reflux disease) 02/02/2016  . Hyperthyroidism 01/31/2016  . 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 Elevated transaminase level 01/31/2016  . Anemia 01/31/2016  . Thrombocytopenia (HCC) 01/31/2016  . Pyuria 01/31/2016  .  Weakness 01/29/2016  . HIV (human immunodeficiency virus infection) (HCC) 01/29/2016  . CAD (coronary artery disease) 01/29/2016  . COPD (chronic obstructive pulmonary disease) (HCC) 01/29/2016  . Diabetes mellitus (HCC) 01/29/2016  . Leg weakness, bilateral 01/13/2016  . Chest pain 06/24/2015    History reviewed. No pertinent surgical history.  Prior to Admission medications   Medication Sig Start Date End Date Taking? Authorizing Provider  abacavir-dolutegravir-lamiVUDine (TRIUMEQ) 600-50-300 MG tablet Take 1 tablet by mouth daily. Reported on 01/30/2016 01/27/16   Historical Provider, MD  albuterol (PROVENTIL HFA;VENTOLIN HFA) 108 (90 BASE) MCG/ACT inhaler Inhale 2 puffs into the lungs every 6 (six) hours as needed for wheezing or shortness of breath.    Historical Provider, MD  albuterol (PROVENTIL) (2.5 MG/3ML) 0.083% nebulizer solution Take 2.5 mg by nebulization every 6 (six) hours as needed for wheezing or shortness of breath.    Historical Provider, MD  apixaban (ELIQUIS) 5 MG TABS tablet Treatment: 10 mg twice daily for 7 days followed by 5 mg twice daily 02/14/16   Emily Filbert, MD  atorvastatin (LIPITOR) 40 MG tablet Take 40 mg by mouth at bedtime.    Historical Provider, MD  Calcium Carbonate-Vitamin D (CALCIUM 600+D) 600-400 MG-UNIT tablet Take 1 tablet by mouth 2 (two) times daily.    Historical Provider, MD  carvedilol (COREG) 12.5 MG tablet Take 12.5 mg by mouth 2 (two) times daily with a meal.    Historical Provider, MD  cetirizine (ZYRTEC) 10 MG  tablet Take 10 mg by mouth daily.    Historical Provider, MD  clonazePAM (KLONOPIN) 0.5 MG tablet Take 1 tablet (0.5 mg total) by mouth 2 (two) times daily as needed for anxiety. 01/31/16   Katharina Caper, MD  EPINEPHrine (EPIPEN 2-PAK) 0.3 mg/0.3 mL IJ SOAJ injection Inject 0.3 mg into the muscle once as needed (for severe allergic reaction).    Historical Provider, MD  escitalopram (LEXAPRO) 20 MG tablet Take 20 mg by mouth daily.     Historical Provider, MD  fluconazole (DIFLUCAN) 200 MG tablet Take 200 mg by mouth daily.    Historical Provider, MD  fluticasone (FLONASE) 50 MCG/ACT nasal spray Place 1 spray into both nostrils daily.    Historical Provider, MD  gemfibrozil (LOPID) 600 MG tablet Take 600 mg by mouth 2 (two) times daily before a meal.    Historical Provider, MD  isosorbide mononitrate (IMDUR) 30 MG 24 hr tablet Take 30 mg by mouth daily.    Historical Provider, MD  linaclotide (LINZESS) 290 MCG CAPS capsule Take 290 mcg by mouth daily before breakfast.    Historical Provider, MD  meloxicam (MOBIC) 7.5 MG tablet Take 7.5 mg by mouth 2 (two) times daily.    Historical Provider, MD  methimazole (TAPAZOLE) 10 MG tablet Take 1 tablet (10 mg total) by mouth daily. 02/02/16   Katharina Caper, MD  mirtazapine (REMERON) 30 MG tablet Take 30 mg by mouth at bedtime.    Historical Provider, MD  mometasone-formoterol (DULERA) 200-5 MCG/ACT AERO Inhale 2 puffs into the lungs 2 (two) times daily.     Historical Provider, MD  Multiple Vitamin (MULTIVITAMIN WITH MINERALS) TABS tablet Take 1 tablet by mouth daily.    Historical Provider, MD  nitroGLYCERIN (NITROSTAT) 0.4 MG SL tablet Place 0.4 mg under the tongue every 5 (five) minutes as needed for chest pain.    Historical Provider, MD  omeprazole (PRILOSEC) 20 MG capsule Take 20 mg by mouth 2 (two) times daily before a meal.    Historical Provider, MD  senna-docusate (SENOKOT-S) 8.6-50 MG tablet Take 1 tablet by mouth at bedtime as needed for mild constipation. 01/31/16   Katharina Caper, MD  tiotropium (SPIRIVA) 18 MCG inhalation capsule Place 18 mcg into inhaler and inhale daily.    Historical Provider, MD  traMADol (ULTRAM) 50 MG tablet Take 1 tablet (50 mg total) by mouth 2 (two) times daily. 01/31/16   Katharina Caper, MD  zolpidem (AMBIEN CR) 12.5 MG CR tablet Take 1 tablet (12.5 mg total) by mouth at bedtime as needed for sleep. 01/31/16   Katharina Caper, MD     Allergies Aspirin; Bee venom; Penicillins; Prednisone; Sulfa antibiotics; Sulfasalazine; Theophyllines; and Theophylline  Family History  Problem Relation Age of Onset  . CAD      Social History Social History  Substance Use Topics  . Smoking status: Former Games developer  . Smokeless tobacco: Never Used  . Alcohol use No    Review of Systems Constitutional: No fever/chills Eyes: No visual changes. ENT: No sore throat. Cardiovascular: As above. Respiratory: Denies shortness of breath. Gastrointestinal: No abdominal pain.  No nausea, no vomiting.  No diarrhea.  No constipation. Genitourinary: Negative for dysuria. Musculoskeletal: Negative for back pain. Skin: Says has some erythema to the right elbow which he has been taking outpatient in about X Denyce Robert says that there was pus weeping centrally from the lesion but this has now stopped. Neurological: Negative for headaches, focal weakness or numbness.  10-point ROS otherwise negative.  ____________________________________________   PHYSICAL EXAM:  VITAL SIGNS: ED Triage Vitals  Enc Vitals Group     BP 03/15/16 1431 127/83     Pulse Rate 03/15/16 1431 (!) 103     Resp 03/15/16 1431 18     Temp 03/15/16 1431 98.7 F (37.1 C)     Temp Source 03/15/16 1431 Oral     SpO2 03/15/16 1431 100 %     Weight 03/15/16 1431 145 lb (65.8 kg)     Height 03/15/16 1431 5\' 5"  (1.651 m)     Head Circumference --      Peak Flow --      Pain Score 03/15/16 1432 10     Pain Loc --      Pain Edu? --      Excl. in GC? --     Constitutional: Alert and oriented. Well appearing and in no acute distress. Eyes: Conjunctivae are normal. PERRL. EOMI. Head: Atraumatic. Nose: No congestion/rhinnorhea. Mouth/Throat: Mucous membranes are moist.  Oropharynx non-erythematous. Neck: No stridor.   Cardiovascular: Normal rate, regular rhythm. Grossly normal heart sounds.  Good peripheral circulation. Respiratory: Normal respiratory effort.  No  retractions. Lungs CTAB. Gastrointestinal: Soft and nontender. No distention. No abdominal bruits. No CVA tenderness. Musculoskeletal: Bilateral lower extremity edema with a right lower extremity being greater than left. Moderate edema to the right lower extremity with mild erythema without any induration or pus. Neurologic:  Normal speech and language. No gross focal neurologic deficits are appreciated. No gait instability. Skin:  Mild erythema overlying the right olecranon that is 3 cm and circular. There is a central ulceration of about 3 mm without any fluctuance or pus drainage. The patient says that the lesion has improved since being on antibiotics because the pus drainage has stopped. Psychiatric: Mood and affect are normal. Speech and behavior are normal.  ____________________________________________   LABS (all labs ordered are listed, but only abnormal results are displayed)  Labs Reviewed  CBC - Abnormal; Notable for the following:       Result Value   RBC 3.59 (*)    Hemoglobin 12.2 (*)    HCT 36.3 (*)    MCV 100.9 (*)    RDW 17.4 (*)    All other components within normal limits  COMPREHENSIVE METABOLIC PANEL - Abnormal; Notable for the following:    Glucose, Bld 121 (*)    Calcium 8.6 (*)    Total Protein 6.4 (*)    Albumin 2.3 (*)    Alkaline Phosphatase 162 (*)    All other components within normal limits  BRAIN NATRIURETIC PEPTIDE - Abnormal; Notable for the following:    B Natriuretic Peptide 109.0 (*)    All other components within normal limits  TROPONIN I   ____________________________________________  EKG  ED ECG REPORT I, Arelia LongestSchaevitz,  David M, the attending physician, personally viewed and interpreted this ECG.   Date: 03/15/2016  EKG Time: 1614  Rate: 96  Rhythm: normal sinus rhythm  Axis: Normal axis  Intervals:Prolonged QT at 517  ST&T Change: No ST segment elevation or depression. No abnormal T-wave  inversion.  ____________________________________________  RADIOLOGY  Detailed outpatient reports of the radiology findings are on the chart including the segmental pulmonary emboli as well as the ultrasound of the bilateral lower external knees. ____________________________________________   PROCEDURES  Procedure(s) performed:   Procedures  Critical Care performed:  CRITICAL CARE Performed by: Arelia LongestSchaevitz,  David M   Total critical care time: 35 minutes  Critical  care time was exclusive of separately billable procedures and treating other patients.  Critical care was necessary to treat or prevent imminent or life-threatening deterioration.  Critical care was time spent personally by me on the following activities: development of treatment plan with patient and/or surrogate as well as nursing, discussions with consultants, evaluation of patient's response to treatment, examination of patient, obtaining history from patient or surrogate, ordering and performing treatments and interventions, ordering and review of laboratory studies, ordering and review of radiographic studies, pulse oximetry and re-evaluation of patient's condition.   ____________________________________________   INITIAL IMPRESSION / ASSESSMENT AND PLAN / ED COURSE  Pertinent labs & imaging results that were available during my care of the patient were reviewed by me and considered in my medical decision making (see chart for details).  ----------------------------------------- 4:55 PM on 03/15/2016 -----------------------------------------  Discussed case with Dr. Wyn Quaker of the vascular service who agrees with the plan to start heparin and for admission to the hospital. He will see the patient as a consult. I signed out the patient to the hospitalist, Dr. Annabelle Harman. I splinted the plan to the patient as well as the family who are understanding and are willing to comply. The patient says that he has had no issues with  bleeding while on eliquis. Furthermore, he is taking 10 mg twice a day of eliquis was recently raised from 5.    Clinical Course     ____________________________________________   FINAL CLINICAL IMPRESSION(S) / ED DIAGNOSES  Pulmonary emboli.    NEW MEDICATIONS STARTED DURING THIS VISIT:  New Prescriptions   No medications on file     Note:  This document was prepared using Dragon voice recognition software and may include unintentional dictation errors.    Myrna Blazer, MD 03/15/16 [phone]  Patient without signs of right heart strain. EKG is reassuring and he has a negative troponin. Also pain-free and without any shortness of breath at this time. I do not feel that he is a candidate this time for any catheter directed thrombolysis.    Myrna Blazer, MD 03/15/16 [phone]

## 2016-03-15 NOTE — ED Notes (Signed)
Pt assisted with urinal while standing along side of bed.

## 2016-03-16 ENCOUNTER — Encounter
Admission: EM | Disposition: A | Discharge status: Skilled Nursing Facility | Payer: Self-pay | Source: Home / Self Care | Attending: Internal Medicine

## 2016-03-16 DIAGNOSIS — Z87891 Personal history of nicotine dependence: Secondary | ICD-10-CM

## 2016-03-16 DIAGNOSIS — B20 Human immunodeficiency virus [HIV] disease: Secondary | ICD-10-CM

## 2016-03-16 DIAGNOSIS — J449 Chronic obstructive pulmonary disease, unspecified: Secondary | ICD-10-CM

## 2016-03-16 DIAGNOSIS — R079 Chest pain, unspecified: Secondary | ICD-10-CM

## 2016-03-16 DIAGNOSIS — I252 Old myocardial infarction: Secondary | ICD-10-CM

## 2016-03-16 DIAGNOSIS — Z794 Long term (current) use of insulin: Secondary | ICD-10-CM

## 2016-03-16 DIAGNOSIS — D649 Anemia, unspecified: Secondary | ICD-10-CM

## 2016-03-16 DIAGNOSIS — R609 Edema, unspecified: Secondary | ICD-10-CM

## 2016-03-16 DIAGNOSIS — I999 Unspecified disorder of circulatory system: Secondary | ICD-10-CM

## 2016-03-16 DIAGNOSIS — I2699 Other pulmonary embolism without acute cor pulmonale: Principal | ICD-10-CM

## 2016-03-16 DIAGNOSIS — Z7901 Long term (current) use of anticoagulants: Secondary | ICD-10-CM

## 2016-03-16 DIAGNOSIS — I2510 Atherosclerotic heart disease of native coronary artery without angina pectoris: Secondary | ICD-10-CM

## 2016-03-16 DIAGNOSIS — E119 Type 2 diabetes mellitus without complications: Secondary | ICD-10-CM

## 2016-03-16 DIAGNOSIS — Z86718 Personal history of other venous thrombosis and embolism: Secondary | ICD-10-CM

## 2016-03-16 DIAGNOSIS — J45909 Unspecified asthma, uncomplicated: Secondary | ICD-10-CM

## 2016-03-16 DIAGNOSIS — Z79899 Other long term (current) drug therapy: Secondary | ICD-10-CM

## 2016-03-16 DIAGNOSIS — R918 Other nonspecific abnormal finding of lung field: Secondary | ICD-10-CM

## 2016-03-16 HISTORY — PX: PERIPHERAL VASCULAR CATHETERIZATION: SHX172C

## 2016-03-16 LAB — CBC
HCT: 33.2 % — ABNORMAL LOW (ref 40.0–52.0)
HEMOGLOBIN: 11.2 g/dL — AB (ref 13.0–18.0)
MCH: 34.3 pg — ABNORMAL HIGH (ref 26.0–34.0)
MCHC: 33.8 g/dL (ref 32.0–36.0)
MCV: 101.5 fL — ABNORMAL HIGH (ref 80.0–100.0)
Platelets: 294 10*3/uL (ref 150–440)
RBC: 3.27 MIL/uL — ABNORMAL LOW (ref 4.40–5.90)
RDW: 16.9 % — ABNORMAL HIGH (ref 11.5–14.5)
WBC: 5.8 10*3/uL (ref 3.8–10.6)

## 2016-03-16 LAB — BASIC METABOLIC PANEL
ANION GAP: 6 (ref 5–15)
BUN: 7 mg/dL (ref 6–20)
CALCIUM: 8.3 mg/dL — AB (ref 8.9–10.3)
CHLORIDE: 112 mmol/L — AB (ref 101–111)
CO2: 24 mmol/L (ref 22–32)
CREATININE: 0.87 mg/dL (ref 0.61–1.24)
GFR calc non Af Amer: >60 mL/min (ref >60)
Glucose, Bld: 87 mg/dL (ref 65–99)
Potassium: 3.3 mmol/L — ABNORMAL LOW (ref 3.5–5.1)
SODIUM: 142 mmol/L (ref 135–145)

## 2016-03-16 LAB — HEPARIN LEVEL (UNFRACTIONATED)
HEPARIN UNFRACTIONATED: 1.54 [IU]/mL — AB (ref 0.30–0.70)
HEPARIN UNFRACTIONATED: 1.39 [IU]/mL — AB (ref 0.30–0.70)

## 2016-03-16 LAB — MAGNESIUM: Magnesium: 1.1 mg/dL — ABNORMAL LOW (ref 1.7–2.4)

## 2016-03-16 LAB — GLUCOSE, CAPILLARY
GLUCOSE-CAPILLARY: 87 mg/dL (ref 65–99)
GLUCOSE-CAPILLARY: 117 mg/dL — AB (ref 65–99)
Glucose-Capillary: 102 mg/dL — ABNORMAL HIGH (ref 65–99)
Glucose-Capillary: 123 mg/dL — ABNORMAL HIGH (ref 65–99)

## 2016-03-16 LAB — APTT
aPTT: 106 seconds — ABNORMAL HIGH (ref 24–36)
aPTT: 139 seconds — ABNORMAL HIGH (ref 24–36)

## 2016-03-16 SURGERY — IVC FILTER INSERTION
Anesthesia: Moderate Sedation

## 2016-03-16 MED ORDER — MAGNESIUM OXIDE 400 (241.3 MG) MG PO TABS
400.0000 mg | ORAL_TABLET | Freq: Two times a day (BID) | ORAL | Status: AC
  Administered 2016-03-16 – 2016-03-17 (×2): 400 mg via ORAL
  Filled 2016-03-16 (×2): qty 1

## 2016-03-16 MED ORDER — HEPARIN (PORCINE) IN NACL 2-0.9 UNIT/ML-% IJ SOLN
INTRAMUSCULAR | Status: AC
  Filled 2016-03-16: qty 500

## 2016-03-16 MED ORDER — MIDAZOLAM HCL 2 MG/2ML IJ SOLN
INTRAMUSCULAR | Status: DC | PRN
  Administered 2016-03-16: 2 mg via INTRAVENOUS

## 2016-03-16 MED ORDER — FENTANYL CITRATE (PF) 100 MCG/2ML IJ SOLN
INTRAMUSCULAR | Status: DC | PRN
  Administered 2016-03-16: 50 ug via INTRAVENOUS

## 2016-03-16 MED ORDER — WARFARIN - PHARMACIST DOSING INPATIENT
Freq: Every day | Status: DC
  Administered 2016-03-16: 18:00:00

## 2016-03-16 MED ORDER — WARFARIN SODIUM 5 MG PO TABS
5.0000 mg | ORAL_TABLET | Freq: Once | ORAL | Status: AC
Start: 2016-03-16 — End: 2016-03-16
  Administered 2016-03-16: 18:00:00 5 mg via ORAL
  Filled 2016-03-16: qty 1

## 2016-03-16 MED ORDER — CLINDAMYCIN PHOSPHATE 300 MG/50ML IV SOLN
300.0000 mg | INTRAVENOUS | Status: AC
  Filled 2016-03-16: qty 50

## 2016-03-16 MED ORDER — CLINDAMYCIN PHOSPHATE 300 MG/50ML IV SOLN
INTRAVENOUS | Status: AC
  Administered 2016-03-16 (×2)
  Filled 2016-03-16: qty 50

## 2016-03-16 MED ORDER — FENTANYL CITRATE (PF) 100 MCG/2ML IJ SOLN
INTRAMUSCULAR | Status: AC
  Filled 2016-03-16: qty 2

## 2016-03-16 MED ORDER — MIDAZOLAM HCL 2 MG/2ML IJ SOLN
INTRAMUSCULAR | Status: AC
  Filled 2016-03-16: qty 2

## 2016-03-16 MED ORDER — POTASSIUM CHLORIDE CRYS ER 20 MEQ PO TBCR
40.0000 meq | EXTENDED_RELEASE_TABLET | ORAL | Status: AC
  Administered 2016-03-16 (×2): 40 meq via ORAL
  Filled 2016-03-16 (×2): qty 2

## 2016-03-16 MED ORDER — MAGNESIUM SULFATE 4 GM/100ML IV SOLN
4.0000 g | Freq: Once | INTRAVENOUS | Status: AC
  Administered 2016-03-16: 4 g via INTRAVENOUS
  Filled 2016-03-16: qty 100

## 2016-03-16 MED ORDER — LIDOCAINE HCL (PF) 1 % IJ SOLN
INTRAMUSCULAR | Status: AC
  Filled 2016-03-16: qty 30

## 2016-03-16 MED ORDER — IOPAMIDOL (ISOVUE-300) INJECTION 61%
INTRAVENOUS | Status: DC | PRN
  Administered 2016-03-16: 9 mL via INTRAVENOUS

## 2016-03-16 MED ORDER — HEPARIN (PORCINE) IN NACL 100-0.45 UNIT/ML-% IJ SOLN
800.0000 [IU]/h | INTRAMUSCULAR | Status: DC
  Filled 2016-03-16 (×2): qty 250

## 2016-03-16 SURGICAL SUPPLY — 4 items
FILTER VC CELECT-FEMORAL (Filter) ×3 IMPLANT
PACK ANGIOGRAPHY (CUSTOM PROCEDURE TRAY) ×3 IMPLANT
SET INTRO CAPELLA COAXIAL (SET/KITS/TRAYS/PACK) ×3 IMPLANT
WIRE J 3MM .035X145CM (WIRE) ×3 IMPLANT

## 2016-03-16 NOTE — Consult Note (Signed)
ANTICOAGULATION CONSULT NOTE - Follow up Consult  Pharmacy Consult for heparin drip Indication: pulmonary embolus  Allergies  Allergen Reactions  . Aspirin Anaphylaxis  . Bee Venom Anaphylaxis  . Penicillins Anaphylaxis and Other (See Comments)    Has patient had a PCN reaction causing immediate rash, facial/tongue/throat swelling, SOB or lightheadedness with hypotension: Yes Has patient had a PCN reaction causing severe rash involving mucus membranes or skin necrosis: No Has patient had a PCN reaction that required hospitalization No Has patient had a PCN reaction occurring within the last 10 years: Yes If all of the above answers are "NO", then may proceed with Cephalosporin use.  . Prednisone Anaphylaxis  . Sulfa Antibiotics Anaphylaxis  . Sulfasalazine Anaphylaxis  . Theophyllines Anaphylaxis  . Theophylline Swelling    Patient Measurements: Height: 5\' 5"  (165.1 cm) Weight: 145 lb (65.8 kg) IBW/kg (Calculated) : 61.5 Heparin Dosing Weight: 65.8kg  Vital Signs: Temp: 98.6 F (37 C) (08/08 0426) Temp Source: Oral (08/08 0426) BP: 132/85 (08/08 0817) Pulse Rate: 102 (08/08 0817)  Labs:  Recent Labs  03/15/16 1549 03/15/16 1728 03/16/16 0236 03/16/16 1027  HGB 12.2*  --  11.2*  --   HCT 36.3*  --  33.2*  --   PLT 384  --  294  --   APTT  --  33 106* 139*  LABPROT  --  17.5*  --   --   INR  --  1.42  --   --   HEPARINUNFRC  --  1.80* 1.54* 1.39*  CREATININE 0.89  --  0.87  --   TROPONINI <0.03  --   --   --     Estimated Creatinine Clearance: 80.5 mL/min (by C-G formula based on SCr of 0.87 mg/dL).   Assessment: Pt is a 60 year old male with a hx of DVT, currently on apixaban, found to have a PE. Per pt, dose of eliquis was recently increased to 10mg  BID. Per patient last dose of apixaban (10mg ) was 8/7 around 0900. Spoke with Dr. Allena KatzPatel, due to the acute nature of the PE, would like to start heparin drip now as opposed to 12 hr after last dose. Therefore will  not bolus. Baseline APTT, INR, CBC and heparin level have been ordered and are in progress.  Baseline heparin level 1.8  Goal of Therapy:  Heparin level 0.3-0.7 units/ml Monitor platelets by anticoagulation protocol: Yes  Goal aPTT 66-102.   Plan:  Start heparin infusion at 1100 units/hr Check anti-Xa level in 6 hours and daily while on heparin Continue to monitor H&H and platelets  Will most likely need to dose based off of APTT until HL and APTT correlate. Follow up on baseline HL.  8/8 02:30 heparin level 1.54, aPTT 106. Decrease rate to 1000 units/hr and recheck in 6 hours.  8/8 1047 heparin level 1.39, apTT 139. RN confirmed heparin drip currently running at 10 ml/hr; no line issues or s/sx of bleeding noted. Hold heparin drip for 30 min and decrease rate to 900 units/hr (=9 ml/hr). Recheck aPTT in 6 h. Heparin level and CBC in AM. Spoke to RN regarding plan to hold heparin drip for 30 min and to restart it at 9 ml/hr.   Crist FatWang, Delani Kohli L 03/16/2016,11:58 AM

## 2016-03-16 NOTE — Progress Notes (Addendum)
[facility] [place] [facility]   PATIENT NAME: Brian Hampton    MR#:  [id number]  DATE OF BIRTH:  [birth year]  SUBJECTIVE:  CHIEF COMPLAINT:   Chief Complaint  Patient presents with  . Shortness of Breath  The patient is a 60 year old Caucasian male with past medical history significant for history of emphysema/COPD, HIV, essential hypertension, MI, diabetes mellitus, who presents to the hospital with complaints of shortness of breath, right-sided chest pain. Apparently patient was diagnosed with DVT about 2 months ago, was initiated on Coumadin, however, changed to Eliquis because of worsening right lower extremity swelling/suspected progression of DVT. He did not improve on this medication and developed shortness of breath and chest discomfort, he was sent to emergency room for further evaluation and treatment. In emergency room, CT scan of the chest revealed pulmonary embolism, he was initiated on IV heparin. Patient was seen by vascular surgeon, who recommended IVC filter placement, but not thrombectomy due to chronicity of clots  Review of Systems  Constitutional: Negative for chills, fever and weight loss.  HENT: Negative for congestion.   Eyes: Negative for blurred vision and double vision.  Respiratory: Positive for shortness of breath. Negative for cough, sputum production and wheezing.   Cardiovascular: Positive for chest pain and leg swelling. Negative for palpitations, orthopnea and PND.  Gastrointestinal: Negative for abdominal pain, blood in stool, constipation, diarrhea, nausea and vomiting.  Genitourinary: Negative for dysuria, frequency, hematuria and urgency.  Musculoskeletal: Negative for falls.  Neurological: Negative for dizziness, tremors, focal weakness and headaches.  Endo/Heme/Allergies: Does not bruise/bleed easily.  Psychiatric/Behavioral: Negative for depression. The patient does not have insomnia.     VITAL SIGNS: Blood pressure  94/69, pulse 86, temperature 98.6 F (37 C), temperature source Oral, resp. rate (!) 21, height 5\' 5"  (1.651 m), weight 65.8 kg (145 lb), SpO2 98 %.  PHYSICAL EXAMINATION:   GENERAL:  60 y.o.-year-old patient lying in the bed with no acute distress.  EYES: Pupils equal, round, reactive to light and accommodation. No scleral icterus. Extraocular muscles intact.  HEENT: Head atraumatic, normocephalic. Oropharynx and nasopharynx clear.  NECK:  Supple, no jugular venous distention. No thyroid enlargement, no tenderness.  LUNGS: Some diminished breath sounds on the right, good air entrance on the left, no wheezing, rales,rhonchi or crepitation. No use of accessory muscles of respiration.  CARDIOVASCULAR: S1, S2 normal. No murmurs, rubs, or gallops.  ABDOMEN: Soft, nontender, nondistended. Bowel sounds present. No organomegaly or mass.  EXTREMITIES: 1-2+ lower extremity and pedal edema, mostly bilateral, however, worse on the right side, no cyanosis, or clubbing.  NEUROLOGIC: Cranial nerves II through XII are intact. Muscle strength 5/5 in all extremities. Sensation intact. Gait not checked.  PSYCHIATRIC: The patient is alert and oriented x 3.  SKIN: No obvious rash, lesion, or ulcer.   ORDERS/RESULTS REVIEWED:   CBC  Recent Labs Lab 03/15/16 1549 03/16/16 0236  WBC 5.8 5.8  HGB 12.2* 11.2*  HCT 36.3* 33.2*  PLT 384 294  MCV 100.9* 101.5*  MCH 33.9 34.3*  MCHC 33.6 33.8  RDW 17.4* 16.9*   ------------------------------------------------------------------------------------------------------------------  Chemistries   Recent Labs Lab 03/15/16 1549 03/16/16 0236  NA 140 142  K 3.6 3.3*  CL 108 112*  CO2 26 24  GLUCOSE 121* 87  BUN 7 7  CREATININE 0.89 0.87  CALCIUM 8.6* 8.3*  AST 37  --   ALT 18  --   ALKPHOS 162*  --   BILITOT 0.4  --    ------------------------------------------------------------------------------------------------------------------  estimated  creatinine clearance is 80.5 mL/min (by C-G formula based on SCr of 0.87 mg/dL). ------------------------------------------------------------------------------------------------------------------ No results for input(s): TSH, T4TOTAL, T3FREE, THYROIDAB in the last 72 hours.  Invalid input(s): FREET3  Cardiac Enzymes  Recent Labs Lab 03/15/16 1549  TROPONINI <0.03   ------------------------------------------------------------------------------------------------------------------ Invalid input(s): POCBNP ---------------------------------------------------------------------------------------------------------------  RADIOLOGY: No results found.  EKG:  Orders placed or performed during the hospital encounter of 03/15/16  . ED EKG  . ED EKG  . EKG 12-Lead  . EKG 12-Lead    ASSESSMENT AND PLAN:  Active Problems:   SOB (shortness of breath) #1. Acute pulmonary embolism . Continue patient on the heparin intravenously, initiate patient on Coumadin therapy, advance to therapeutic levels while in the hospital. IVC filter will be placed due to right lower extremity DVT, discussed with Dr. Gilda Crease today. #2. Right lower extremity DVT, likely chronic, according to vascular surgeon, no thrombectomy was recommended, but IVC filter placement, to be performed today #3. Hypokalemia, supplement orally, check magnesium level #4 anemia, get Hemoccult, reassess in the morning  Management plans discussed with the patient, family and they are in agreement.   DRUG ALLERGIES:  Allergies  Allergen Reactions  . Aspirin Anaphylaxis  . Bee Venom Anaphylaxis  . Penicillins Anaphylaxis and Other (See Comments)    Has patient had a PCN reaction causing immediate rash, facial/tongue/throat swelling, SOB or lightheadedness with hypotension: Yes Has patient had a PCN reaction causing severe rash involving mucus membranes or skin necrosis: No Has patient had a PCN reaction that required hospitalization  No Has patient had a PCN reaction occurring within the last 10 years: Yes If all of the above answers are "NO", then may proceed with Cephalosporin use.  . Prednisone Anaphylaxis  . Sulfa Antibiotics Anaphylaxis  . Sulfasalazine Anaphylaxis  . Theophyllines Anaphylaxis  . Theophylline Swelling    CODE STATUS:     Code Status Orders        Start     Ordered   03/15/16 1743  Do not attempt resuscitation (DNR)  Continuous    Question Answer Comment  In the event of cardiac or respiratory ARREST Do not call a "code blue"   In the event of cardiac or respiratory ARREST Do not perform Intubation, CPR, defibrillation or ACLS   In the event of cardiac or respiratory ARREST Use medication by any route, position, wound care, and other measures to relive pain and suffering. May use oxygen, suction and manual treatment of airway obstruction as needed for comfort.      03/15/16 1742    Code Status History    Date Active Date Inactive Code Status Order ID Comments User Context   03/15/2016  5:29 PM 03/15/2016  5:42 PM Full Code [id number]  Auburn Bilberry, MD ED   01/30/2016 12:55 AM 02/03/2016 12:55 AM DNR [id number]  Gery Pray, MD Inpatient   01/13/2016  9:46 PM 01/16/2016  4:18 PM Full Code [id number]  Shaune Pollack, MD Inpatient   06/24/2015  7:13 AM 06/25/2015 12:27 PM Full Code [id number]  Milagros Loll, MD ED    Advance Directive Documentation   Flowsheet Row Most Recent Value  Type of Advance Directive  Healthcare Power of [place], Living will [POA: Junious Dresser Gibson]  Pre-existing out of facility DNR order (yellow form or pink MOST form)  No data  "MOST" Form in Place?  No data      TOTAL TIME TAKING CARE OF THIS PATIENT: 40 minutes.  Discussed with vascular surgeon, Dr. Fredia Beets  M.D on 03/16/2016 at 2:12 PM  Between 7am to 6pm - Pager - [phone]  After 6pm go to [email or web address] - password [email or web address] [facility]  [facility]  Office  [phone]  CC: Primary care  physician; [facility]

## 2016-03-16 NOTE — [place] FL2 (Signed)
[place] MEDICAID FL2 LEVEL OF CARE SCREENING TOOL     IDENTIFICATION  Patient Name: Brian Hampton Birthdate: 1956-07-19 Sex: male Admission Date (Current Location): 03/15/2016  [facility] and [place] Number:  [name]  ([phone] [id number]) Facility and Address:  [facility], [address], [place], [place] [address]      Provider Number: [id number]  Attending Physician Name and Address:  Katharina Caper, MD  Relative Name and Phone Number:       Current Level of Care: Hospital Recommended Level of Care: Skilled Nursing Facility Prior Approval Number:    Date Approved/Denied:   PASRR Number:  ( [id number] )  Discharge Plan: SNF    Current Diagnoses: Patient Active Problem List   Diagnosis Date Noted  . SOB (shortness of breath) 03/15/2016  . Dysphagia 02/02/2016  . GERD (gastroesophageal reflux disease) 02/02/2016  . Hyperthyroidism 01/31/2016  . 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 Elevated transaminase level 01/31/2016  . Anemia 01/31/2016  . Thrombocytopenia (HCC) 01/31/2016  . Pyuria 01/31/2016  . Weakness 01/29/2016  . HIV (human immunodeficiency virus infection) (HCC) 01/29/2016  . CAD (coronary artery disease) 01/29/2016  . COPD (chronic obstructive pulmonary disease) (HCC) 01/29/2016  . Diabetes mellitus (HCC) 01/29/2016  . Leg weakness, bilateral 01/13/2016  . Chest pain 06/24/2015    Orientation RESPIRATION BLADDER Height & Weight     Self, Time, Situation, Place  Normal Continent Weight: 145 lb (65.8 kg) Height:  5\' 5"  (165.1 cm)  BEHAVIORAL SYMPTOMS/MOOD NEUROLOGICAL BOWEL NUTRITION STATUS   (none )  (none ) Continent Diet (NPO )  AMBULATORY STATUS COMMUNICATION OF NEEDS Skin   Extensive Assist Verbally Normal                       Personal Care Assistance Level of Assistance  Bathing, Feeding, Dressing Bathing Assistance: Limited assistance Feeding assistance: Independent Dressing Assistance: Limited assistance      Functional Limitations Info  Sight, Hearing, Speech Sight Info: Adequate Hearing Info: Adequate Speech Info: Adequate    SPECIAL CARE FACTORS FREQUENCY                       Contractures      Additional Factors Info  Code Status, Allergies, Insulin Sliding Scale Code Status Info:  (DNR ) Allergies Info:  (Aspirin, Bee Venom, Penicillins, Prednisone, Sulfa Antibiotics, Sulfasalazine, Theophyllines, Theophylline)   Insulin Sliding Scale Info:  (NovoLog Insuling Injections 3 times per day. )       Current Medications (03/16/2016):  This is the current hospital active medication list Current Facility-Administered Medications  Medication Dose Route Frequency Provider Last Rate Last Dose  . 0.9 %  sodium chloride infusion  250 mL Intravenous PRN Auburn Bilberry, MD      . abacavir-dolutegravir-lamiVUDine (TRIUMEQ) 600-50-300 MG per tablet 1 tablet  1 tablet Oral Daily Auburn Bilberry, MD   1 tablet at 03/16/16 0824  . acetaminophen (TYLENOL) tablet 650 mg  650 mg Oral Q6H PRN Auburn Bilberry, MD       Or  . acetaminophen (TYLENOL) suppository 650 mg  650 mg Rectal Q6H PRN Auburn Bilberry, MD      . albuterol (PROVENTIL) (2.5 MG/3ML) 0.083% nebulizer solution 2.5 mg  2.5 mg Inhalation Q6H PRN Auburn Bilberry, MD      . atorvastatin (LIPITOR) tablet 40 mg  40 mg Oral QHS Auburn Bilberry, MD   40 mg at 03/15/16 2009  . calcium-vitamin D (OSCAL WITH D) 500-200 MG-UNIT  per tablet 1 tablet  1 tablet Oral BID Auburn BilberryShreyang Patel, MD   1 tablet at 03/16/16 0826  . carvedilol (COREG) tablet 12.5 mg  12.5 mg Oral BID WC Auburn BilberryShreyang Patel, MD   12.5 mg at 03/16/16 0825  . clindamycin (CLEOCIN) IVPB 300 mg  300 mg Intravenous On Call to OR Renford DillsGregory G Schnier, MD      . clonazePAM Scarlette Calico(KLONOPIN) tablet 0.5 mg  0.5 mg Oral BID PRN Auburn BilberryShreyang Patel, MD      . escitalopram (LEXAPRO) tablet 20 mg  20 mg Oral Daily Auburn BilberryShreyang Patel, MD   20 mg at 03/16/16 0824  . fluticasone (FLONASE) 50 MCG/ACT nasal spray 1 spray  1  spray Each Nare Daily Auburn BilberryShreyang Patel, MD   1 spray at 03/16/16 [id number]  . heparin ADULT infusion 100 units/mL (25000 units/23050mL sodium chloride 0.45%)  1,000 Units/hr Intravenous Continuous Auburn BilberryShreyang Patel, MD 10 mL/hr at 03/16/16 0425 1,000 Units/hr at 03/16/16 0425  . HYDROcodone-acetaminophen (NORCO/VICODIN) 5-325 MG per tablet 1-2 tablet  1-2 tablet Oral Q4H PRN Auburn BilberryShreyang Patel, MD      . insulin aspart (novoLOG) injection 0-9 Units  0-9 Units Subcutaneous TID WC Auburn BilberryShreyang Patel, MD      . isosorbide mononitrate (IMDUR) 24 hr tablet 30 mg  30 mg Oral Daily Auburn BilberryShreyang Patel, MD   30 mg at 03/16/16 0824  . linaclotide (LINZESS) capsule 290 mcg  290 mcg Oral QAC breakfast Auburn BilberryShreyang Patel, MD   290 mcg at 03/16/16 0824  . loratadine (CLARITIN) tablet 10 mg  10 mg Oral Daily Auburn BilberryShreyang Patel, MD   10 mg at 03/16/16 0825  . meloxicam (MOBIC) tablet 7.5 mg  7.5 mg Oral BID Auburn BilberryShreyang Patel, MD   7.5 mg at 03/16/16 0826  . methimazole (TAPAZOLE) tablet 10 mg  10 mg Oral Daily Auburn BilberryShreyang Patel, MD   10 mg at 03/16/16 0825  . mirtazapine (REMERON) tablet 30 mg  30 mg Oral QHS Auburn BilberryShreyang Patel, MD   30 mg at 03/15/16 2007  . mometasone-formoterol (DULERA) 200-5 MCG/ACT inhaler 2 puff  2 puff Inhalation BID Auburn BilberryShreyang Patel, MD   2 puff at 03/16/16 [id number]  . multivitamin with minerals tablet 1 tablet  1 tablet Oral Daily Auburn BilberryShreyang Patel, MD   1 tablet at 03/16/16 0825  . nitroGLYCERIN (NITROSTAT) SL tablet 0.4 mg  0.4 mg Sublingual Q5 min PRN Auburn BilberryShreyang Patel, MD      . ondansetron (ZOFRAN) tablet 4 mg  4 mg Oral Q6H PRN Auburn BilberryShreyang Patel, MD       Or  . ondansetron (ZOFRAN) injection 4 mg  4 mg Intravenous Q6H PRN Auburn BilberryShreyang Patel, MD      . pantoprazole (PROTONIX) EC tablet 40 mg  40 mg Oral Daily Auburn BilberryShreyang Patel, MD   40 mg at 03/16/16 0825  . potassium chloride SA (K-DUR,KLOR-CON) CR tablet 40 mEq  40 mEq Oral Q4H Katharina Caperima Vaickute, MD   40 mEq at 03/16/16 0823  . senna-docusate (Senokot-S) tablet 1 tablet  1 tablet Oral QHS PRN  Auburn BilberryShreyang Patel, MD      . sodium chloride flush (NS) 0.9 % injection 3 mL  3 mL Intravenous Q12H Auburn BilberryShreyang Patel, MD   3 mL at 03/16/16 0827  . sodium chloride flush (NS) 0.9 % injection 3 mL  3 mL Intravenous Q12H Auburn BilberryShreyang Patel, MD   3 mL at 03/16/16 0827  . sodium chloride flush (NS) 0.9 % injection 3 mL  3 mL Intravenous PRN Auburn BilberryShreyang Patel, MD      .  tiotropium (SPIRIVA) inhalation capsule 18 mcg  18 mcg Inhalation Daily Shreyang PAuburn Bilberry18 mcg at 03/16/16 5621  . traMADol (ULTRAM) tablet 50 mg  50 mg Oral BID Auburn Bilberry, MD   50 mg at 03/16/16 0825  . zolpidem (AMBIEN) tablet 5 mg  5 mg Oral QHS PRN Auburn Bilberry, MD         Discharge Medications: Please see discharge summary for a list of discharge medications.  Relevant Imaging Results:  Relevant Lab Results:   Additional Information  (SSN: [id number])  Brezlyn Manrique, Darleen Crocker, LCSW

## 2016-03-16 NOTE — Op Note (Signed)
[facility]   OPERATIVE NOTE    PRE-OPERATIVE DIAGNOSIS: Left lower lobe acute pulmonary emboli in association with right leg DVT  POST-OPERATIVE DIAGNOSIS: Same  PROCEDURE: 1.   Ultrasound guidance for vascular access to the left common femoral vein 2.   Catheter placement into the inferior vena cava 3.   Inferior venacavogram 4.   Placement of a Celect IVC filter  SURGEON: Ava Tangney, Latina CraverGregory G  ASSISTANT(S): None  ANESTHESIA: Conscious sedation was administered under my direct supervision. IV Versed plus fentanyl were utilized. Continuous ECG, pulse oximetry and blood pressure was monitored throughout the entire procedure.  Conscious sedation was for a total of 20 minutes.  ESTIMATED BLOOD LOSS: minimal  FINDING(S): 1.  Patent IVC  SPECIMEN(S):  none  INDICATIONS:   Brian Hampton is a 60 y.o. y.o. male who presents with left lower lobe pulmonary emboli in association with right DVT. Patient has been taking his request and by his description he has been taking it properly. This does represent a failure of anticoagulation.  Inferior vena cava filter is indicated for this reason.  Risks and benefits including filter thrombosis, migration, fracture, bleeding, and infection were all discussed.  We discussed that all IVC filters that we place can be removed if desired from the patient once the need for the filter has passed.    DESCRIPTION: After obtaining full informed written consent, the patient was brought back to the vascular suite. The skin was sterilely prepped and draped in a sterile surgical field was created. The left common femoral vein was accessed under direct ultrasound guidance without difficulty with a Seldinger needle and a J-wire was then placed. The dilator is passed over the wire and the delivery sheath was placed into the inferior vena cava.  Inferior venacavogram was performed. This demonstrated a patent IVC with the level of the renal veins at  L1.  The filter was then deployed into the inferior vena cava at the level of L2 just below the renal veins. The delivery sheath was then removed. Pressure was held. Sterile dressings were placed. The patient tolerated the procedure well and was taken to the recovery room in stable condition.  Interpretation: IVC is widely patent contrast is actually refluxed into the renal veins. Cava measures 18 mm in diameter. Filter is deployed in good orientation at the L2 level.  COMPLICATIONS: None  CONDITION: Stable  Dr. Latina CraverGregory G Binta Statzer  03/16/2016, 1:36 PM

## 2016-03-16 NOTE — Care Management Important Message (Signed)
Important Message  Patient Details  Name: Brian Hampton MRN: [id number] Date of Birth: [birth year]   Medicare Important Message Given:  Yes    Gwenette GreetBrenda S Aureliano Oshields, RN 03/16/2016, 8:27 AM

## 2016-03-16 NOTE — Consult Note (Signed)
   [facility]   03/16/2016  Brian Hampton 20-Apr-1956 [id number]   Patient screened for potential [facility] Care Management services. Patient is eligible for [facility] Care Management Services. Electronic medical record reveals patient is a resident at a long term care facility. [facility] Care Management services not appropriate at this time. If patient's post hospital needs change please place a [facility] consult. For questions please contact:   Aleem Elza RN, BSN [facility]  Hospital Liaison  [phone]) Business Mobile [phone]) Toll free office

## 2016-03-16 NOTE — Progress Notes (Signed)
ANTICOAGULATION CONSULT NOTE - Initial Consult  Pharmacy Consult for warfarin Indication: pulmonary embolus  Allergies  Allergen Reactions  . Aspirin Anaphylaxis  . Bee Venom Anaphylaxis  . Penicillins Anaphylaxis and Other (See Comments)    Has patient had a PCN reaction causing immediate rash, facial/tongue/throat swelling, SOB or lightheadedness with hypotension: Yes Has patient had a PCN reaction causing severe rash involving mucus membranes or skin necrosis: No Has patient had a PCN reaction that required hospitalization No Has patient had a PCN reaction occurring within the last 10 years: Yes If all of the above answers are "NO", then may proceed with Cephalosporin use.  . Prednisone Anaphylaxis  . Sulfa Antibiotics Anaphylaxis  . Sulfasalazine Anaphylaxis  . Theophyllines Anaphylaxis  . Theophylline Swelling    Patient Measurements: Height: 5\' 5"  (165.1 cm) Weight: 145 lb (65.8 kg) IBW/kg (Calculated) : 61.5   Vital Signs: Temp: 98 F (36.7 C) (08/08 1419) Temp Source: Oral (08/08 1419) BP: 109/68 (08/08 1419) Pulse Rate: 88 (08/08 1419)  Labs:  Recent Labs  03/15/16 1549 03/15/16 1728 03/16/16 0236 03/16/16 1027  HGB 12.2*  --  11.2*  --   HCT 36.3*  --  33.2*  --   PLT 384  --  294  --   APTT  --  33 106* 139*  LABPROT  --  17.5*  --   --   INR  --  1.42  --   --   HEPARINUNFRC  --  1.80* 1.54* 1.39*  CREATININE 0.89  --  0.87  --   TROPONINI <0.03  --   --   --     Estimated Creatinine Clearance: 80.5 mL/min (by C-G formula based on SCr of 0.87 mg/dL).   Assessment: 60 yo male previously on apixaban now admitted and on a heparin drip for PE. IVC filter placed today. Starting on warfarin.  Baseline INR 1.42 on 8/7, albumin 2.3.   Goal of Therapy:  INR 2-3 Monitor platelets by anticoagulation protocol: Yes   Plan:  Will order warfarin 5 mg PO for tonight since INR is elevated.  Will follow up INR in AM.   Marty HeckWang, Kasia Trego L 03/16/2016,4:02  PM

## 2016-03-16 NOTE — Progress Notes (Signed)
Heparin drip stopped at 12:00pm per order. Verified with Henriette CombsAmanda RN. Drip to be restarted at 12:30pm. See new orders.

## 2016-03-16 NOTE — Progress Notes (Signed)
Patient leaving floor to have IVC filter placed. Report given to [facility] RN who is aware Heparin drip was stopped at 12:00pm and is to be re-started at 12:30pm at new ordered rate.   Patient updated on current treatment plans and verbalizes understanding.

## 2016-03-16 NOTE — Consult Note (Signed)
[facility] CONSULT NOTE  Patient Care Team: [facility] as PCP - General (Pulmonary Disease)  CHIEF COMPLAINTS/PURPOSE OF CONSULTATION:  PE on Eliquis  HISTORY OF PRESENTING ILLNESS:  Brian Hampton 60 y.o.  male the history of COPD CAD HIV-on treatment under control; had a DVT of the left lower extremity approximately 2 months ago when he was started on Eliquis. Patient stated that he has been compliant with Eliquis- however noted to have worsening shortness of breath and sharp chest pains on the right side. This led to a CTA of the chest- outside facility- left lower lobe segmental and subsegmental PE. Lower extremity venous Doppler showed diffuse thrombosis of right lower extremity.   Patient has been evaluated by vascular surgery ; and had IVC filter placement.    He continues to complain of right-sided chest wall pains. Denies any nausea vomiting. Daily headaches. Denies bleeding. Has chronic swelling of the bilateral lower extremities left more than right.    ROS: A comlete 10 point review of system is done which is negative except mentioned above in history of present illness  MEDICAL HISTORY:  Past Medical History:  Diagnosis Date  . Anemia   . Asthma   . Collagen vascular disease (HCC)   . COPD (chronic obstructive pulmonary disease) (HCC)   . Coronary artery disease   . Emphysema/COPD (HCC)   . HIV (human immunodeficiency virus infection) (HCC)   . Hypertension   . Lung mass   . Myocardial infarction (HCC)    in 2000  . Type 2 diabetes mellitus (HCC)     SURGICAL HISTORY: History reviewed. No pertinent surgical history.  SOCIAL HISTORY: Social History   Social History  . Marital status: Widowed    Spouse name: N/A  . Number of children: N/A  . Years of education: N/A   Occupational History  . Not on file.   Social History Main Topics  . Smoking status: Former Games developer  . Smokeless tobacco: Never Used  . Alcohol use No  . Drug  use: No  . Sexual activity: Not on file   Other Topics Concern  . Not on file   Social History Narrative  . No narrative on file    FAMILY HISTORY: Family History  Problem Relation Age of Onset  . CAD      ALLERGIES:  is allergic to aspirin; bee venom; penicillins; prednisone; sulfa antibiotics; sulfasalazine; theophyllines; and theophylline.  MEDICATIONS:  Current Facility-Administered Medications  Medication Dose Route Frequency Provider Last Rate Last Dose  . 0.9 %  sodium chloride infusion  250 mL Intravenous PRN Auburn Bilberry, MD      . abacavir-dolutegravir-lamiVUDine (TRIUMEQ) 600-50-300 MG per tablet 1 tablet  1 tablet Oral Daily Auburn Bilberry, MD   1 tablet at 03/16/16 0824  . acetaminophen (TYLENOL) tablet 650 mg  650 mg Oral Q6H PRN Auburn Bilberry, MD       Or  . acetaminophen (TYLENOL) suppository 650 mg  650 mg Rectal Q6H PRN Auburn Bilberry, MD      . albuterol (PROVENTIL) (2.5 MG/3ML) 0.083% nebulizer solution 2.5 mg  2.5 mg Inhalation Q6H PRN Auburn Bilberry, MD      . atorvastatin (LIPITOR) tablet 40 mg  40 mg Oral QHS Auburn Bilberry, MD   40 mg at 03/15/16 2009  . calcium-vitamin D (OSCAL WITH D) 500-200 MG-UNIT per tablet 1 tablet  1 tablet Oral BID Auburn Bilberry, MD   1 tablet at 03/16/16 0826  .  carvedilol (COREG) tablet 12.5 mg  12.5 mg Oral BID WC Auburn Bilberry, MD   12.5 mg at 03/16/16 0825  . clindamycin (CLEOCIN) IVPB 300 mg  300 mg Intravenous On Call to OR Renford Dills, MD      . clonazePAM Scarlette Calico) tablet 0.5 mg  0.5 mg Oral BID PRN Auburn Bilberry, MD      . escitalopram (LEXAPRO) tablet 20 mg  20 mg Oral Daily Auburn Bilberry, MD   20 mg at 03/16/16 0824  . fluticasone (FLONASE) 50 MCG/ACT nasal spray 1 spray  1 spray Each Nare Daily Auburn Bilberry, MD   1 spray at 03/16/16 1610  . heparin ADULT infusion 100 units/mL (25000 units/285mL sodium chloride 0.45%)  900 Units/hr Intravenous Continuous Myrna Blazer, MD 9 mL/hr at 03/16/16  1419 900 Units/hr at 03/16/16 1419  . HYDROcodone-acetaminophen (NORCO/VICODIN) 5-325 MG per tablet 1-2 tablet  1-2 tablet Oral Q4H PRN Auburn Bilberry, MD      . insulin aspart (novoLOG) injection 0-9 Units  0-9 Units Subcutaneous TID WC Auburn Bilberry, MD      . isosorbide mononitrate (IMDUR) 24 hr tablet 30 mg  30 mg Oral Daily Auburn Bilberry, MD   30 mg at 03/16/16 0824  . linaclotide (LINZESS) capsule 290 mcg  290 mcg Oral QAC breakfast Auburn Bilberry, MD   290 mcg at 03/16/16 0824  . loratadine (CLARITIN) tablet 10 mg  10 mg Oral Daily Auburn Bilberry, MD   10 mg at 03/16/16 0825  . methimazole (TAPAZOLE) tablet 10 mg  10 mg Oral Daily Auburn Bilberry, MD   10 mg at 03/16/16 0825  . mirtazapine (REMERON) tablet 30 mg  30 mg Oral QHS Auburn Bilberry, MD   30 mg at 03/15/16 2007  . mometasone-formoterol (DULERA) 200-5 MCG/ACT inhaler 2 puff  2 puff Inhalation BID Auburn Bilberry, MD   2 puff at 03/16/16 9604  . multivitamin with minerals tablet 1 tablet  1 tablet Oral Daily Auburn Bilberry, MD   1 tablet at 03/16/16 0825  . nitroGLYCERIN (NITROSTAT) SL tablet 0.4 mg  0.4 mg Sublingual Q5 min PRN Auburn Bilberry, MD      . ondansetron (ZOFRAN) tablet 4 mg  4 mg Oral Q6H PRN Auburn Bilberry, MD       Or  . ondansetron (ZOFRAN) injection 4 mg  4 mg Intravenous Q6H PRN Auburn Bilberry, MD      . pantoprazole (PROTONIX) EC tablet 40 mg  40 mg Oral Daily Auburn Bilberry, MD   40 mg at 03/16/16 0825  . senna-docusate (Senokot-S) tablet 1 tablet  1 tablet Oral QHS PRN Auburn Bilberry, MD      . sodium chloride flush (NS) 0.9 % injection 3 mL  3 mL Intravenous Q12H Auburn Bilberry, MD   3 mL at 03/16/16 0827  . sodium chloride flush (NS) 0.9 % injection 3 mL  3 mL Intravenous Q12H Auburn Bilberry, MD   3 mL at 03/16/16 0827  . sodium chloride flush (NS) 0.9 % injection 3 mL  3 mL Intravenous PRN Auburn Bilberry, MD      . tiotropium (SPIRIVA) inhalation capsule 18 mcg  18 mcg Inhalation Daily Auburn Bilberry, MD   18  mcg at 03/16/16 5409  . traMADol (ULTRAM) tablet 50 mg  50 mg Oral BID Auburn Bilberry, MD   50 mg at 03/16/16 0825  . warfarin (COUMADIN) tablet 5 mg  5 mg Oral ONCE-1800 Katharina Caper, MD      . Warfarin -  Pharmacist Dosing Inpatient   Does not apply q1800 Katharina Caperima Vaickute, MD      . zolpidem (AMBIEN) tablet 5 mg  5 mg Oral QHS PRN Auburn BilberryShreyang Patel, MD          .  PHYSICAL EXAMINATION:  Vitals:   03/16/16 1400 03/16/16 1419  BP: 94/69 109/68  Pulse: 86 88  Resp: (!) 21 18  Temp:  98 F (36.7 C)   Filed Weights   03/15/16 1431  Weight: 145 lb (65.8 kg)    GENERAL: Well-nourished well-developed; Alert, no distress and comfortable.  Poor dentition. Alone. EYES: no pallor or icterus OROPHARYNX: no thrush or ulceration. NECK: supple, no masses felt LYMPH:  no palpable lymphadenopathy in the cervical, axillary or inguinal regions LUNGS: decreased breath sounds to auscultation at bases and  No wheeze or crackles HEART/CVS: regular rate & rhythm and no murmurs; right lower extremity edema compared to the left. ABDOMEN: abdomen soft, non-tender and normal bowel sounds Musculoskeletal:no cyanosis of digits and no clubbing  PSYCH: alert & oriented x 3 with fluent speech NEURO: no focal motor/sensory deficits SKIN:  no rashes or significant lesions  LABORATORY DATA:  I have reviewed the data as listed Lab Results  Component Value Date   WBC 5.8 03/16/2016   HGB 11.2 (L) 03/16/2016   HCT 33.2 (L) 03/16/2016   MCV 101.5 (H) 03/16/2016   PLT 294 03/16/2016    Recent Labs  01/13/16 1419  01/31/16 0431  02/14/16 2008 03/15/16 1549 03/16/16 0236  NA 139  < >  --   < > 137 140 142  K 4.5  < >  --   < > 3.8 3.6 3.3*  CL 111  < >  --   < > 103 108 112*  CO2 20*  < >  --   < > 25 26 24   GLUCOSE 177*  < >  --   < > 144* 121* 87  BUN 33*  < >  --   < > 16 7 7   CREATININE 1.17  < >  --   < > 1.12 0.89 0.87  CALCIUM 8.4*  < >  --   < > 8.4* 8.6* 8.3*  GFRNONAA >60  < >  --   < > >60  >60 >60  GFRAA >60  < >  --   < > >60 >60 >60  PROT 6.4*  < > 5.6*  --  6.4* 6.4*  --   ALBUMIN 2.8*  < > 2.3*  --  1.9* 2.3*  --   AST 36  < > 31  --  25 37  --   ALT 56  < > 86*  --  24 18  --   ALKPHOS 75  < > 95  --  149* 162*  --   BILITOT 0.3  < > 0.4  --  0.6 0.4  --   BILIDIR <0.1*  --  <0.1*  --   --   --   --   IBILI NOT CALCULATED  --  NOT CALCULATED  --   --   --   --   < > = values in this interval not displayed.  RADIOGRAPHIC STUDIES: I have personally reviewed the radiological images as listed and agreed with the findings in the report. No results found.  ASSESSMENT & PLAN:   # 60 year old male patient with history of DVT of the left lower extremity 2 months ago on Eliquis- noted to have  PE.  # Pulmonary embolism while on Eliquis- agree with IVC filter placement. Continue IV heparin. Ideally patient will benefit from subcutaneous Lovenox [1mg /kg SQ BID] since it would be a failure of Eliquis. However, would need insurance approval/financial ability.  # DVT- chronic. Plan as above. Patient will likely need indefinite anticoagulation-to his multiple DVT/PEs.  # Will also discuss with patient's pulmonologist; Dr.Khan.  Thank you Dr.Vaickute for allowing me to participate in the care of your pleasant patient. Please do not hesitate to contact me with questions or concerns in the interim.  All questions were answered. The patient knows to call the clinic with any problems, questions or concerns.    Earna Coder, MD 03/16/2016 4:11 PM

## 2016-03-16 NOTE — Consult Note (Signed)
ANTICOAGULATION CONSULT NOTE - Initial Consult  Pharmacy Consult for heparin drip Indication: pulmonary embolus  Allergies  Allergen Reactions  . Aspirin Anaphylaxis  . Bee Venom Anaphylaxis  . Penicillins Anaphylaxis and Other (See Comments)    Has patient had a PCN reaction causing immediate rash, facial/tongue/throat swelling, SOB or lightheadedness with hypotension: Yes Has patient had a PCN reaction causing severe rash involving mucus membranes or skin necrosis: No Has patient had a PCN reaction that required hospitalization No Has patient had a PCN reaction occurring within the last 10 years: Yes If all of the above answers are "NO", then may proceed with Cephalosporin use.  . Prednisone Anaphylaxis  . Sulfa Antibiotics Anaphylaxis  . Sulfasalazine Anaphylaxis  . Theophyllines Anaphylaxis  . Theophylline Swelling    Patient Measurements: Height:  (165.1 cm) Weight: 145 lb (65.8 kg) IBW/kg (Calculated) : 61.5 Heparin Dosing Weight: 65.8kg  Vital Signs: Temp: 97.7 F (36.5 C) (08/07 2020) Temp Source: Oral (08/07 2020) BP: 119/75 (08/07 2020) Pulse Rate: 93 (08/07 2020)  Labs:  Recent Labs  03/15/16 1549 03/15/16 1728 03/16/16 0236  HGB 12.2*  --  11.2*  HCT 36.3*  --  33.2*  PLT 384  --  294  APTT  --  33 106*  LABPROT  --  17.5*  --   INR  --  1.42  --   HEPARINUNFRC  --  1.80* 1.54*  CREATININE 0.89  --  0.87  TROPONINI <0.03  --   --     Estimated Creatinine Clearance: 80.5 mL/min (by C-G formula based on SCr of 0.87 mg/dL).   Medical History: Past Medical History:  Diagnosis Date  . Anemia   . Asthma   . Collagen vascular disease (HCC)   . COPD (chronic obstructive pulmonary disease) (HCC)   . Coronary artery disease   . Emphysema/COPD (HCC)   . HIV (human immunodeficiency virus infection) (HCC)   . Hypertension   . Lung mass   . Myocardial infarction (HCC)    in 2000  . Type 2 diabetes mellitus (HCC)     Medications:  Scheduled:   . abacavir-dolutegravir-lamiVUDine  1 tablet Oral Daily  . atorvastatin  40 mg Oral QHS  . calcium-vitamin D  1 tablet Oral BID  . carvedilol  12.5 mg Oral BID WC  . escitalopram  20 mg Oral Daily  . fluticasone  1 spray Each Nare Daily  . insulin aspart  0-9 Units Subcutaneous TID WC  . isosorbide mononitrate  30 mg Oral Daily  . linaclotide  290 mcg Oral QAC breakfast  . loratadine  10 mg Oral Daily  . meloxicam  7.5 mg Oral BID  . methimazole  10 mg Oral Daily  . mirtazapine  30 mg Oral QHS  . mometasone-formoterol  2 puff Inhalation BID  . multivitamin with minerals  1 tablet Oral Daily  . pantoprazole  40 mg Oral Daily  . sodium chloride flush  3 mL Intravenous Q12H  . sodium chloride flush  3 mL Intravenous Q12H  . tiotropium  18 mcg Inhalation Daily  . traMADol  50 mg Oral BID    Assessment: Pt is a 60 year old male with a hx of DVT, currently on apixaban, found to have a PE. Per pt, dose of eliquis was recently increased to  BID. Per patient last dose of apixaban ( ) was this morning around 0900. Spoke with Dr. Allena Katz, due to the acute nature of the PE, would like to start  heparin drip now as opposed to 12 hr after last dose. Therefore will not bolus. Baseline APTT, INR, CBC and heparin level have been ordered and are in progress.  Baseline heparin level 1.8  Goal of Therapy:  Heparin level 0.3-0.7 units/ml Monitor platelets by anticoagulation protocol: Yes  Goal aPTT 66-102.   Plan:  Start heparin infusion at 1100 units/hr Check anti-Xa level in 6 hours and daily while on heparin Continue to monitor H&H and platelets  Will most likely need to dose based off of APTT until HL and APTT correlate. Follow up on baseline HL.  8/8 02:30 heparin level 1.54, aPTT 106. Decrease rate to 1000 units/hr and recheck in 6 hours.  Abeeha Twist S 03/16/2016,4:21 AM

## 2016-03-16 NOTE — Clinical Social Work Note (Signed)
Clinical Social Work Assessment  Patient Details  Name: TRENTIN KNAPPENBERGER MRN: [id number] Date of Birth: Mar 25, 1956  Date of referral:  03/16/16               Reason for consult:  Other (Comment Required) (From [place] )                Permission sought to share information with:  Chartered certified accountant granted to share information::  Yes, Verbal Permission Granted  Name::      [facility]   Agency::     Relationship::     Contact Information:     Housing/Transportation Living arrangements for the past 2 months:  [place] of Information:  Patient, Facility Patient Interpreter Needed:  None Criminal Activity/Legal Involvement Pertinent to Current Situation/Hospitalization:  No - Comment as needed Significant Relationships:  Siblings Lives with:  Facility Resident Do you feel safe going back to the place where you live?  Yes Need for family participation in patient care:  Yes (Comment)  Care giving concerns:  Patient is a long term care resident at [facility].    Social Worker assessment / plan:  Holiday representative ([place]) received verbal consult from [employer] that patient is a long term care resident at [facility]. Per Juliann Pulse patient can return to [facility] when stable. CSW met with patient alone at bedside. CSW introduced self and explained role of CSW department. Patient was very pleasant and alert and oriented. Patient reported that he was staying at a boarding house and then move to [facility] because he required more care. Per patient his sister Marlowe Kays is his primary support. Patient is agreeable to return to [facility] when stable.   FL2 complete and faxed out. CSW will continue to follow and assist as needed.   Employment status:  Retired, Disabled (Comment on whether or not currently receiving Disability) Insurance information:  Medicare, Medicaid In [place] PT  Recommendations:  Not assessed at this time Information / Referral to community resources:  [place]  Patient/Family's Response to care:  Patient is agreeable to return to [facility].   Patient/Family's Understanding of and Emotional Response to Diagnosis, Current Treatment, and Prognosis:  Patient is very knowledgeable about his medical health and diagnosis. Patient reported that he will need compression hoes and his legs elevated at the facility.   Emotional Assessment Appearance:  Appears stated age Attitude/Demeanor/Rapport:    Affect (typically observed):  Accepting, Adaptable, Pleasant Orientation:  Oriented to Self, Oriented to Place, Oriented to  Time, Oriented to Situation Alcohol / Substance use:  Not Applicable Psych involvement (Current and /or in the community):  No (Comment)  Discharge Needs  Concerns to be addressed:  Discharge Planning Concerns Readmission within the last 30 days:  No Current discharge risk:  Dependent with Mobility, Chronically ill Barriers to Discharge:  Continued Medical Work up   [employer], Veronia Beets, LCSW 03/16/2016, 12:21 PM

## 2016-03-16 NOTE — Progress Notes (Signed)
Heparin drip restarted at new rate. Verified with Henriette CombsAmanda RN. Pharmacist Clydie BraunKaren notified.

## 2016-03-16 NOTE — Consult Note (Signed)
[facility] Vascular Consult Note  MRN : [id number]  Brian Hampton is a 60 y.o. ([birth year]) male who presents with chief complaint of  Chief Complaint  Hampton presents with  . Shortness of Breath  .  History of Present Illness:  Brian Hampton is a 60 year old gentleman with a history of chronic DVT. Proximally 2 months ago he was found to have right lower extremity DVT and was placed on Eliquis as his anticoagulant. He reports he took 10 mg twice a day for Brian first 7 days and is been taking 5 mg twice a day since. He states he has not missed any doses. Yesterday Brian Hampton was seen by his pulmonologist as he was complaining of increasing shortness of breath as well as chest pain on deep inspiration. A CT scan was ordered which was notable for acute pulmonary emboli in Brian left lower lobe. A Doppler of Brian right leg venous system demonstrated what appears to be acute on chronic DVT. He has been admitted and placed on a heparin drip and I have been asked to evaluate. Today he notes he is feeling much better with resolution of his shortness of breath and improvement of his pleuritic symptoms.  Current Facility-Administered Medications  Medication Dose Route Frequency Provider Last Rate Last Dose  . [MAR Hold] 0.9 %  sodium chloride infusion  250 mL Intravenous PRN Auburn Bilberry, MD      . [name] Hold] abacavir-dolutegravir-lamiVUDine (TRIUMEQ) 600-50-300 MG per tablet 1 tablet  1 tablet Oral Daily Auburn Bilberry, MD   1 tablet at 03/16/16 0824  . [MAR Hold] acetaminophen (TYLENOL) tablet 650 mg  650 mg Oral Q6H PRN Auburn Bilberry, MD       Or  . [name] Hold] acetaminophen (TYLENOL) suppository 650 mg  650 mg Rectal Q6H PRN Auburn Bilberry, MD      . [name] Hold] albuterol (PROVENTIL) (2.5 MG/3ML) 0.083% nebulizer solution 2.5 mg  2.5 mg Inhalation Q6H PRN Auburn Bilberry, MD      . [name] Hold] atorvastatin (LIPITOR) tablet 40 mg  40 mg Oral QHS Auburn Bilberry, MD   40 mg at  03/15/16 2009  . [MAR Hold] calcium-vitamin D (OSCAL WITH D) 500-200 MG-UNIT per tablet 1 tablet  1 tablet Oral BID Auburn Bilberry, MD   1 tablet at 03/16/16 0826  . [MAR Hold] carvedilol (COREG) tablet 12.5 mg  12.5 mg Oral BID WC Auburn Bilberry, MD   12.5 mg at 03/16/16 0825  . [MAR Hold] clindamycin (CLEOCIN) IVPB 300 mg  300 mg Intravenous On Call to OR Renford Dills, MD      . [name] Hold] clonazePAM [facility]) tablet 0.5 mg  0.5 mg Oral BID PRN Auburn Bilberry, MD      . [name] Hold] escitalopram (LEXAPRO) tablet 20 mg  20 mg Oral Daily Auburn Bilberry, MD   20 mg at 03/16/16 0824  . fentaNYL (SUBLIMAZE) injection    PRN Renford Dills, MD   50 mcg at 03/16/16 1303  . [MAR Hold] fluticasone (FLONASE) 50 MCG/ACT nasal spray 1 spray  1 spray Each Nare Daily Auburn Bilberry, MD   1 spray at 03/16/16 7829  . heparin ADULT infusion 100 units/mL (25000 units/215mL sodium chloride 0.45%)  900 Units/hr Intravenous Continuous Myrna Blazer, MD      . [name] Hold] HYDROcodone-acetaminophen (NORCO/VICODIN) 5-325 MG per tablet 1-2 tablet  1-2 tablet Oral Q4H PRN Auburn Bilberry, MD      . [name] Hold] insulin  aspart (novoLOG) injection 0-9 Units  0-9 Units Subcutaneous TID WC Auburn Bilberry, MD      . iopamidol (ISOVUE-300) 61 % injection    PRN Renford Dills, MD   9 mL at 03/16/16 1325  . [MAR Hold] isosorbide mononitrate (IMDUR) 24 hr tablet 30 mg  30 mg Oral Daily Auburn Bilberry, MD   30 mg at 03/16/16 0824  . [MAR Hold] linaclotide (LINZESS) capsule 290 mcg  290 mcg Oral QAC breakfast Auburn Bilberry, MD   290 mcg at 03/16/16 0824  . [MAR Hold] loratadine (CLARITIN) tablet 10 mg  10 mg Oral Daily Auburn Bilberry, MD   10 mg at 03/16/16 0825  . [MAR Hold] meloxicam (MOBIC) tablet 7.5 mg  7.5 mg Oral BID Auburn Bilberry, MD   7.5 mg at 03/16/16 0826  . [MAR Hold] methimazole (TAPAZOLE) tablet 10 mg  10 mg Oral Daily Auburn Bilberry, MD   10 mg at 03/16/16 0825  . midazolam (VERSED) injection    PRN  Renford Dills, MD   2 mg at 03/16/16 1303  . [MAR Hold] mirtazapine (REMERON) tablet 30 mg  30 mg Oral QHS Auburn Bilberry, MD   30 mg at 03/15/16 2007  . [MAR Hold] mometasone-formoterol (DULERA) 200-5 MCG/ACT inhaler 2 puff  2 puff Inhalation BID Auburn Bilberry, MD   2 puff at 03/16/16 8469  . [MAR Hold] multivitamin with minerals tablet 1 tablet  1 tablet Oral Daily Auburn Bilberry, MD   1 tablet at 03/16/16 0825  . [MAR Hold] nitroGLYCERIN (NITROSTAT) SL tablet 0.4 mg  0.4 mg Sublingual Q5 min PRN Auburn Bilberry, MD      . [name] Hold] ondansetron (ZOFRAN) tablet 4 mg  4 mg Oral Q6H PRN Auburn Bilberry, MD       Or  . [name] Hold] ondansetron (ZOFRAN) injection 4 mg  4 mg Intravenous Q6H PRN Auburn Bilberry, MD      . [name] Hold] pantoprazole (PROTONIX) EC tablet 40 mg  40 mg Oral Daily Auburn Bilberry, MD   40 mg at 03/16/16 0825  . potassium chloride SA (K-DUR,KLOR-CON) CR tablet 40 mEq  40 mEq Oral Q4H Katharina Caper, MD   40 mEq at 03/16/16 0823  . [MAR Hold] senna-docusate (Senokot-S) tablet 1 tablet  1 tablet Oral QHS PRN Auburn Bilberry, MD      . [name] Hold] sodium chloride flush (NS) 0.9 % injection 3 mL  3 mL Intravenous Q12H Auburn Bilberry, MD   3 mL at 03/16/16 0827  . [MAR Hold] sodium chloride flush (NS) 0.9 % injection 3 mL  3 mL Intravenous Q12H Auburn Bilberry, MD   3 mL at 03/16/16 0827  . [MAR Hold] sodium chloride flush (NS) 0.9 % injection 3 mL  3 mL Intravenous PRN Auburn Bilberry, MD      . [name] Hold] tiotropium [facility]) inhalation capsule 18 mcg  18 mcg Inhalation Daily Auburn Bilberry, MD   18 mcg at 03/16/16 6295  . [MAR Hold] traMADol (ULTRAM) tablet 50 mg  50 mg Oral BID Auburn Bilberry, MD   50 mg at 03/16/16 0825  . [MAR Hold] zolpidem (AMBIEN) tablet 5 mg  5 mg Oral QHS PRN Auburn Bilberry, MD        Past Medical History:  Diagnosis Date  . Anemia   . Asthma   . Collagen vascular disease (HCC)   . COPD (chronic obstructive pulmonary disease) (HCC)   . Coronary artery  disease   . Emphysema/COPD (HCC)   .  HIV (human immunodeficiency virus infection) (HCC)   . Hypertension   . Lung mass   . Myocardial infarction (HCC)    in 2000  . Type 2 diabetes mellitus (HCC)     History reviewed. No pertinent surgical history.  Social History Social History  Substance Use Topics  . Smoking status: Former Games developer  . Smokeless tobacco: Never Used  . Alcohol use No    Family History Family History  Problem Relation Age of Onset  . CAD    No family history bleeding clotting disorders, autoimmune disease or porphyria  Allergies  Allergen Reactions  . Aspirin Anaphylaxis  . Bee Venom Anaphylaxis  . Penicillins Anaphylaxis and Other (See Comments)    Has Hampton had a PCN reaction causing immediate rash, facial/tongue/throat swelling, SOB or lightheadedness with hypotension: Yes Has Hampton had a PCN reaction causing severe rash involving mucus membranes or skin necrosis: No Has Hampton had a PCN reaction that required hospitalization No Has Hampton had a PCN reaction occurring within Brian last 10 years: Yes If all of Brian above answers are "NO", then may proceed with Cephalosporin use.  . Prednisone Anaphylaxis  . Sulfa Antibiotics Anaphylaxis  . Sulfasalazine Anaphylaxis  . Theophyllines Anaphylaxis  . Theophylline Swelling     REVIEW OF SYSTEMS (Negative unless checked)  Constitutional: [] Weight loss  [] Fever  [] Chills Cardiac: [] Chest pain   [] Chest pressure   [] Palpitations   [] Shortness of breath when laying flat   [] Shortness of breath at rest   [] Shortness of breath with exertion. Vascular:  [] Pain in legs with walking   [] Pain in legs at rest   [] Pain in legs when laying flat   [] Claudication   [] Pain in feet when walking  [] Pain in feet at rest  [] Pain in feet when laying flat   [] History of DVT   [] Phlebitis   [] Swelling in legs   [] Varicose veins   [] Non-healing ulcers Pulmonary:   [] Uses home oxygen   [] Productive cough   [] Hemoptysis    [] Wheeze  [] COPD   [] Asthma Neurologic:  [] Dizziness  [] Blackouts   [] Seizures   [] History of stroke   [] History of TIA  [] Aphasia   [] Temporary blindness   [] Dysphagia   [] Weakness or numbness in arms   [] Weakness or numbness in legs Musculoskeletal:  [] Arthritis   [] Joint swelling   [] Joint pain   [] Low back pain Hematologic:  [] Easy bruising  [] Easy bleeding   [] Hypercoagulable state   [] Anemic  [] Hepatitis Gastrointestinal:  [] Blood in stool   [] Vomiting blood  [] Gastroesophageal reflux/heartburn   [] Difficulty swallowing. Genitourinary:  [] Chronic kidney disease   [] Difficult urination  [] Frequent urination  [] Burning with urination   [] Blood in urine Skin:  [] Rashes   [] Ulcers   [] Wounds Psychological:  [] History of anxiety   []  History of major depression.  Physical Examination  Vitals:   03/15/16 1850 03/15/16 2020 03/16/16 0426 03/16/16 0817  BP: 132/85 119/75 108/67 132/85  Pulse: (!) 104 93 87 (!) 102  Resp: 18 18 20 16   Temp: 98 F (36.7 C) 97.7 F (36.5 C) 98.6 F (37 C)   TempSrc: Oral Oral Oral   SpO2: 98% 99% 94% 99%  Weight:      Height:       Body mass index is 24.13 kg/m. Gen:  WD/WN, NAD Head: [place]/AT, No temporalis wasting. Prominent temp pulse not noted. Ear/Nose/Throat: Hearing grossly intact, nares w/o erythema or drainage, oropharynx w/o Erythema/Exudate Eyes: PERRLA, EOMI.  Neck: Supple, no nuchal rigidity.  No bruit or JVD.  Pulmonary:  Good air movement, clear to auscultation bilaterally.  Cardiac: RRR, normal S1, S2, no Murmurs, rubs or gallops. Vascular: Brian right leg is pink and warm there is 3+ pitting edema scattered varicosities noted; left leg is pink and warm there is 1+ edema with a few varicosities noted. Vessel Right Left  Radial Palpable Palpable  Ulnar Palpable Palpable  Brachial Palpable Palpable  Carotid Palpable, without bruit Palpable, without bruit  Aorta Not palpable N/A  Femoral Palpable Palpable  Popliteal Palpable Palpable  PT  Palpable Palpable  DP Palpable Palpable   Gastrointestinal: soft, non-tender/non-distended. No guarding/reflex. No masses, surgical incisions, or scars. Musculoskeletal: M/S 5/5 throughout.  Extremities without ischemic changes.  No deformity or atrophy. No edema. Neurologic: CN 2-12 intact. Pain and light touch intact in extremities.  Symmetrical.  Speech is fluent. Motor exam as listed above. Psychiatric: Judgment intact, Mood & affect appropriate for pt's clinical situation. Dermatologic: No rashes or ulcers noted.  No cellulitis or open wounds. Lymph : No Cervical, Axillary, or Inguinal lymphadenopathy.   CBC Lab Results  Component Value Date   WBC 5.8 03/16/2016   HGB 11.2 (L) 03/16/2016   HCT 33.2 (L) 03/16/2016   MCV 101.5 (H) 03/16/2016   PLT 294 03/16/2016    BMET    Component Value Date/Time   NA 142 03/16/2016 0236   NA 145 10/02/2011 0913   K 3.3 (L) 03/16/2016 0236   K 3.9 10/02/2011 0913   CL 112 (H) 03/16/2016 0236   CL 111 (H) 10/02/2011 0913   CO2 24 03/16/2016 0236   CO2 22 10/02/2011 0913   GLUCOSE 87 03/16/2016 0236   GLUCOSE 138 (H) 10/02/2011 0913   BUN 7 03/16/2016 0236   BUN 16 10/02/2011 0913   CREATININE 0.87 03/16/2016 0236   CREATININE 0.97 10/02/2011 0913   CALCIUM 8.3 (L) 03/16/2016 0236   CALCIUM 8.8 10/02/2011 0913   GFRNONAA >60 03/16/2016 0236   GFRNONAA >60 10/02/2011 0913   GFRAA >60 03/16/2016 0236   GFRAA >60 10/02/2011 0913   Estimated Creatinine Clearance: 80.5 mL/min (by C-G formula based on SCr of 0.87 mg/dL).  COAG Lab Results  Component Value Date   INR 1.42 03/15/2016   INR 1.11 01/30/2016   INR 1.06 01/14/2016    Assessment/Plan 1. Pulmonary embolism associated with acute on chronic right leg DVT Hampton has been started on IV heparin drip, given Brian acute pulmonary emboli associated with his pleuritic chest pains and shortness of breath I believe a filter is indicated I've discussed this in detail with Brian Hampton  including Brian intention to remove Brian filter 4-6 months down Brian [address] once we have established and adequate anticoagulation plan. I do not feel that thrombolysis is indicated given Brian likelihood that there is acute on chronic there would be little benefit with higher risk given Brian chronicity of Brian clot.  Hematology is consulted to see Brian Hampton to see if they feel that this is an Eliquis failure. I would defer Brian ultimate choice of anticoagulant to Brian medical service and Brian hematology service. His legs should be elevated. He is on a heparin drip so Brian TED hose and SCDs are contraindicated given his acute clot.  2. HIV We'll continue his HIV therapy  3. COPD Without any evidence of acute exasperation Continued therapy with nebs when necessary and inhalers  4. Hypertension Continue therapy with Coreg  5. Coronary artery disease Continue Coreg and isosorbide mononitrate  Piya Mesch, Latina CraverGregory G, MD  03/16/2016 1:26 PM

## 2016-03-17 ENCOUNTER — Encounter: Payer: Self-pay | Admitting: Vascular Surgery

## 2016-03-17 DIAGNOSIS — I82401 Acute embolism and thrombosis of unspecified deep veins of right lower extremity: Secondary | ICD-10-CM | POA: Diagnosis not present

## 2016-03-17 DIAGNOSIS — E876 Hypokalemia: Secondary | ICD-10-CM | POA: Diagnosis not present

## 2016-03-17 DIAGNOSIS — E119 Type 2 diabetes mellitus without complications: Secondary | ICD-10-CM | POA: Diagnosis not present

## 2016-03-17 DIAGNOSIS — B20 Human immunodeficiency virus [HIV] disease: Secondary | ICD-10-CM | POA: Diagnosis not present

## 2016-03-17 DIAGNOSIS — R531 Weakness: Secondary | ICD-10-CM | POA: Diagnosis not present

## 2016-03-17 DIAGNOSIS — S51001S Unspecified open wound of right elbow, sequela: Secondary | ICD-10-CM | POA: Diagnosis not present

## 2016-03-17 DIAGNOSIS — G4700 Insomnia, unspecified: Secondary | ICD-10-CM | POA: Diagnosis not present

## 2016-03-17 DIAGNOSIS — E0590 Thyrotoxicosis, unspecified without thyrotoxic crisis or storm: Secondary | ICD-10-CM | POA: Diagnosis not present

## 2016-03-17 DIAGNOSIS — R41841 Cognitive communication deficit: Secondary | ICD-10-CM | POA: Diagnosis not present

## 2016-03-17 DIAGNOSIS — I82502 Chronic embolism and thrombosis of unspecified deep veins of left lower extremity: Secondary | ICD-10-CM | POA: Diagnosis not present

## 2016-03-17 DIAGNOSIS — K589 Irritable bowel syndrome without diarrhea: Secondary | ICD-10-CM | POA: Diagnosis not present

## 2016-03-17 DIAGNOSIS — Z86711 Personal history of pulmonary embolism: Secondary | ICD-10-CM | POA: Diagnosis not present

## 2016-03-17 DIAGNOSIS — Z95828 Presence of other vascular implants and grafts: Secondary | ICD-10-CM

## 2016-03-17 DIAGNOSIS — G720 Drug-induced myopathy: Secondary | ICD-10-CM | POA: Diagnosis not present

## 2016-03-17 DIAGNOSIS — G608 Other hereditary and idiopathic neuropathies: Secondary | ICD-10-CM | POA: Diagnosis not present

## 2016-03-17 DIAGNOSIS — M79605 Pain in left leg: Secondary | ICD-10-CM | POA: Diagnosis not present

## 2016-03-17 DIAGNOSIS — F339 Major depressive disorder, recurrent, unspecified: Secondary | ICD-10-CM | POA: Diagnosis not present

## 2016-03-17 DIAGNOSIS — K219 Gastro-esophageal reflux disease without esophagitis: Secondary | ICD-10-CM | POA: Diagnosis not present

## 2016-03-17 DIAGNOSIS — R0602 Shortness of breath: Secondary | ICD-10-CM | POA: Diagnosis not present

## 2016-03-17 DIAGNOSIS — I82501 Chronic embolism and thrombosis of unspecified deep veins of right lower extremity: Secondary | ICD-10-CM | POA: Diagnosis not present

## 2016-03-17 DIAGNOSIS — Z01810 Encounter for preprocedural cardiovascular examination: Secondary | ICD-10-CM | POA: Diagnosis not present

## 2016-03-17 DIAGNOSIS — R6889 Other general symptoms and signs: Secondary | ICD-10-CM | POA: Diagnosis not present

## 2016-03-17 DIAGNOSIS — I2510 Atherosclerotic heart disease of native coronary artery without angina pectoris: Secondary | ICD-10-CM | POA: Diagnosis not present

## 2016-03-17 DIAGNOSIS — Z741 Need for assistance with personal care: Secondary | ICD-10-CM | POA: Diagnosis not present

## 2016-03-17 DIAGNOSIS — R0782 Intercostal pain: Secondary | ICD-10-CM | POA: Diagnosis not present

## 2016-03-17 DIAGNOSIS — Z21 Asymptomatic human immunodeficiency virus [HIV] infection status: Secondary | ICD-10-CM | POA: Diagnosis not present

## 2016-03-17 DIAGNOSIS — G8929 Other chronic pain: Secondary | ICD-10-CM | POA: Diagnosis not present

## 2016-03-17 DIAGNOSIS — I2699 Other pulmonary embolism without acute cor pulmonale: Secondary | ICD-10-CM | POA: Diagnosis not present

## 2016-03-17 DIAGNOSIS — I82509 Chronic embolism and thrombosis of unspecified deep veins of unspecified lower extremity: Secondary | ICD-10-CM | POA: Diagnosis not present

## 2016-03-17 DIAGNOSIS — F419 Anxiety disorder, unspecified: Secondary | ICD-10-CM | POA: Diagnosis not present

## 2016-03-17 DIAGNOSIS — I10 Essential (primary) hypertension: Secondary | ICD-10-CM | POA: Diagnosis not present

## 2016-03-17 DIAGNOSIS — E785 Hyperlipidemia, unspecified: Secondary | ICD-10-CM | POA: Diagnosis not present

## 2016-03-17 DIAGNOSIS — R918 Other nonspecific abnormal finding of lung field: Secondary | ICD-10-CM | POA: Diagnosis not present

## 2016-03-17 DIAGNOSIS — I2609 Other pulmonary embolism with acute cor pulmonale: Secondary | ICD-10-CM | POA: Diagnosis not present

## 2016-03-17 DIAGNOSIS — Z7401 Bed confinement status: Secondary | ICD-10-CM | POA: Diagnosis not present

## 2016-03-17 DIAGNOSIS — D649 Anemia, unspecified: Secondary | ICD-10-CM | POA: Diagnosis not present

## 2016-03-17 DIAGNOSIS — M6281 Muscle weakness (generalized): Secondary | ICD-10-CM | POA: Diagnosis not present

## 2016-03-17 DIAGNOSIS — I82491 Acute embolism and thrombosis of other specified deep vein of right lower extremity: Secondary | ICD-10-CM | POA: Diagnosis not present

## 2016-03-17 DIAGNOSIS — I82409 Acute embolism and thrombosis of unspecified deep veins of unspecified lower extremity: Secondary | ICD-10-CM | POA: Diagnosis not present

## 2016-03-17 DIAGNOSIS — J449 Chronic obstructive pulmonary disease, unspecified: Secondary | ICD-10-CM | POA: Diagnosis not present

## 2016-03-17 DIAGNOSIS — R252 Cramp and spasm: Secondary | ICD-10-CM | POA: Diagnosis not present

## 2016-03-17 DIAGNOSIS — N189 Chronic kidney disease, unspecified: Secondary | ICD-10-CM | POA: Diagnosis not present

## 2016-03-17 DIAGNOSIS — R202 Paresthesia of skin: Secondary | ICD-10-CM | POA: Diagnosis not present

## 2016-03-17 DIAGNOSIS — R269 Unspecified abnormalities of gait and mobility: Secondary | ICD-10-CM | POA: Diagnosis not present

## 2016-03-17 LAB — CBC
HEMATOCRIT: 32.2 % — AB (ref 40.0–52.0)
HEMOGLOBIN: 11 g/dL — AB (ref 13.0–18.0)
MCH: 34.5 pg — ABNORMAL HIGH (ref 26.0–34.0)
MCHC: 34.2 g/dL (ref 32.0–36.0)
MCV: 101.1 fL — ABNORMAL HIGH (ref 80.0–100.0)
Platelets: 301 10*3/uL (ref 150–440)
RBC: 3.19 MIL/uL — AB (ref 4.40–5.90)
RDW: 17 % — ABNORMAL HIGH (ref 11.5–14.5)
WBC: 5.3 10*3/uL (ref 3.8–10.6)

## 2016-03-17 LAB — POTASSIUM: POTASSIUM: 4.7 mmol/L (ref 3.5–5.1)

## 2016-03-17 LAB — MAGNESIUM: MAGNESIUM: 2.4 mg/dL (ref 1.7–2.4)

## 2016-03-17 LAB — HEPARIN LEVEL (UNFRACTIONATED): HEPARIN UNFRACTIONATED: 0.88 [IU]/mL — AB (ref 0.30–0.70)

## 2016-03-17 LAB — APTT
APTT: 95 s — AB (ref 24–36)
APTT: 124 s — AB (ref 24–36)

## 2016-03-17 LAB — GLUCOSE, CAPILLARY
Glucose-Capillary: 77 mg/dL (ref 65–99)
Glucose-Capillary: 128 mg/dL — ABNORMAL HIGH (ref 65–99)

## 2016-03-17 LAB — PROTIME-INR
INR: 1.33
PROTHROMBIN TIME: 16.6 s — AB (ref 11.4–15.2)

## 2016-03-17 MED ORDER — ENOXAPARIN SODIUM 80 MG/0.8ML ~~LOC~~ SOLN
1.0000 mg/kg | Freq: Two times a day (BID) | SUBCUTANEOUS | Status: DC
  Administered 2016-03-17: 10:00:00 65 mg via SUBCUTANEOUS
  Filled 2016-03-17 (×2): qty 0.8

## 2016-03-17 MED ORDER — ENOXAPARIN SODIUM 150 MG/ML ~~LOC~~ SOLN: 1.0000 mg/kg | Freq: Two times a day (BID) | SUBCUTANEOUS | 6 refills | Status: DC

## 2016-03-17 MED ORDER — TRAMADOL HCL 50 MG PO TABS: 50.0000 mg | ORAL_TABLET | Freq: Four times a day (QID) | ORAL | 0 refills | Status: DC | PRN

## 2016-03-17 MED ORDER — ZOLPIDEM TARTRATE ER 12.5 MG PO TBCR: 12.5000 mg | EXTENDED_RELEASE_TABLET | Freq: Every evening | ORAL | 0 refills | Status: DC | PRN

## 2016-03-17 NOTE — Consult Note (Signed)
ANTICOAGULATION CONSULT NOTE - Follow up Consult  Pharmacy Consult for heparin drip Indication: pulmonary embolus  Allergies  Allergen Reactions  . Aspirin Anaphylaxis  . Bee Venom Anaphylaxis  . Penicillins Anaphylaxis and Other (See Comments)    Has patient had a PCN reaction causing immediate rash, facial/tongue/throat swelling, SOB or lightheadedness with hypotension: Yes Has patient had a PCN reaction causing severe rash involving mucus membranes or skin necrosis: No Has patient had a PCN reaction that required hospitalization No Has patient had a PCN reaction occurring within the last 10 years: Yes If all of the above answers are "NO", then may proceed with Cephalosporin use.  . Prednisone Anaphylaxis  . Sulfa Antibiotics Anaphylaxis  . Sulfasalazine Anaphylaxis  . Theophyllines Anaphylaxis  . Theophylline Swelling    Patient Measurements: Height: 5\' 5"  (165.1 cm) Weight: 145 lb (65.8 kg) IBW/kg (Calculated) : 61.5 Heparin Dosing Weight: 65.8kg  Vital Signs: Temp: 97.8 F (36.6 C) (08/09 0437) Temp Source: Oral (08/09 0437) BP: 102/61 (08/09 0437) Pulse Rate: 86 (08/09 0437)  Labs:  Recent Labs  03/15/16 1549  03/15/16 1728 03/16/16 0236 03/16/16 1027 03/16/16 2135  HGB 12.2*  --   --  11.2*  --   --   HCT 36.3*  --   --  33.2*  --   --   PLT 384  --   --  294  --   --   APTT  --   < > 33 106* 139* 95*  LABPROT  --   --  17.5*  --   --   --   INR  --   --  1.42  --   --   --   HEPARINUNFRC  --   --  1.80* 1.54* 1.39*  --   CREATININE 0.89  --   --  0.87  --   --   TROPONINI <0.03  --   --   --   --   --   < > = values in this interval not displayed.  Estimated Creatinine Clearance: 80.5 mL/min (by C-G formula based on SCr of 0.87 mg/dL).   Assessment: Pt is a 60 year old male with a hx of DVT, currently on apixaban, found to have a PE. Per pt, dose of eliquis was recently increased to 10mg  BID. Per patient last dose of apixaban (10mg ) was 8/7 around  0900. Spoke with Dr. Allena KatzPatel, due to the acute nature of the PE, would like to start heparin drip now as opposed to 12 hr after last dose. Therefore will not bolus. Baseline APTT, INR, CBC and heparin level have been ordered and are in progress.  Baseline heparin level 1.8  Goal of Therapy:  Heparin level 0.3-0.7 units/ml Monitor platelets by anticoagulation protocol: Yes  Goal aPTT 66-102.   Plan:  Start heparin infusion at 1100 units/hr Check anti-Xa level in 6 hours and daily while on heparin Continue to monitor H&H and platelets  Will most likely need to dose based off of APTT until HL and APTT correlate. Follow up on baseline HL.  8/8 02:30 heparin level 1.54, aPTT 106. Decrease rate to 1000 units/hr and recheck in 6 hours.  8/8 1047 heparin level 1.39, apTT 139. RN confirmed heparin drip currently running at 10 ml/hr; no line issues or s/sx of bleeding noted. Hold heparin drip for 30 min and decrease rate to 900 units/hr (=9 ml/hr). Recheck aPTT in 6 h. Heparin level and CBC in AM. Spoke to RN regarding  plan to hold heparin drip for 30 min and to restart it at 9 ml/hr.   8/8 21:  APTT 95. Continue current regimen and recheck with heparin level in AM.  Latrell Reitan S 03/17/2016,4:51 AM

## 2016-03-17 NOTE — Progress Notes (Signed)
Brian Hampton   DOB:[birth year]   ZO#:[id number]#:[id number]    Subjective: patient resting in the bed. No acute distress. Continues to complain of pain in his left lower extremity. Chronic.  No blood in stools. No black stools.  Objective:  Vitals:   03/17/16 0935 03/17/16 1328  BP: 100/66 100/68  Pulse:  84  Resp:  18  Temp:  97.5 F (36.4 C)     Intake/Output Summary (Last 24 hours) at 03/17/16 1951 Last data filed at 03/17/16 1144  Gross per 24 hour  Intake              480 ml  Output              275 ml  Net              205 ml    GENERAL: Well-nourished well-developed; Alert, no distress and comfortable.  Poor dentition. Alone. EYES: no pallor or icterus OROPHARYNX: no thrush or ulceration. NECK: supple, no masses felt LYMPH:  no palpable lymphadenopathy in the cervical, axillary or inguinal regions LUNGS: decreased breath sounds to auscultation at bases and  No wheeze or crackles HEART/CVS: regular rate & rhythm and no murmurs; right lower extremity edema compared to the left. ABDOMEN: abdomen soft, non-tender and normal bowel sounds Musculoskeletal:no cyanosis of digits and no clubbing  PSYCH: alert & oriented x 3 with fluent speech NEURO: no focal motor/sensory deficits SKIN:  no rashes or significant lesions   Labs:  Lab Results  Component Value Date   WBC 5.3 03/17/2016   HGB 11.0 (L) 03/17/2016   HCT 32.2 (L) 03/17/2016   MCV 101.1 (H) 03/17/2016   PLT 301 03/17/2016   NEUTROABS 4.2 02/14/2016    Lab Results  Component Value Date   NA 142 03/16/2016   K 4.7 03/17/2016   CL 112 (H) 03/16/2016   CO2 24 03/16/2016    Studies:  No results found.  Assessment & Plan:   # 60 year old male patient with history of DVT of the left lower extremity 2 months ago on Eliquis- noted to have PE.  # Pulmonary embolism while on Eliquis- s/p IVC filter placement. subcutaneous Lovenox [1mg /kg SQ BID] since it would be a failure of Eliquis.   # DVT- chronic. Plan as above.  Patient will likely need indefinite anticoagulation-to his multiple DVT/PEs.  Discussed with Dr.Vaickute. Follow-up in the cancer Center in the next 2-3 weeks.   Earna CoderGovinda R Brahmanday, MD 03/17/2016  7:51 PM

## 2016-03-17 NOTE — Care Management (Signed)
Attending states patient is medically stable for discharge to skilled nursing facility on Lovenox injections.  Notified CSW

## 2016-03-17 NOTE — Consult Note (Signed)
ANTICOAGULATION CONSULT NOTE - Follow up Consult  Pharmacy Consult for heparin drip Indication: pulmonary embolus  Allergies  Allergen Reactions  . Aspirin Anaphylaxis  . Bee Venom Anaphylaxis  . Penicillins Anaphylaxis and Other (See Comments)    Has patient had a PCN reaction causing immediate rash, facial/tongue/throat swelling, SOB or lightheadedness with hypotension: Yes Has patient had a PCN reaction causing severe rash involving mucus membranes or skin necrosis: No Has patient had a PCN reaction that required hospitalization No Has patient had a PCN reaction occurring within the last 10 years: Yes If all of the above answers are "NO", then may proceed with Cephalosporin use.  . Prednisone Anaphylaxis  . Sulfa Antibiotics Anaphylaxis  . Sulfasalazine Anaphylaxis  . Theophyllines Anaphylaxis  . Theophylline Swelling    Patient Measurements: Height: 5\' 5"  (165.1 cm) Weight: 145 lb (65.8 kg) IBW/kg (Calculated) : 61.5 Heparin Dosing Weight: 65.8kg  Vital Signs: Temp: 97.8 F (36.6 C) (08/09 0437) Temp Source: Oral (08/09 0437) BP: 102/61 (08/09 0437) Pulse Rate: 86 (08/09 0437)  Labs:  Recent Labs  03/15/16 1549  03/15/16 1728 03/16/16 0236 03/16/16 1027 03/16/16 2135 03/17/16 0616  HGB 12.2*  --   --  11.2*  --   --  11.0*  HCT 36.3*  --   --  33.2*  --   --  32.2*  PLT 384  --   --  294  --   --  301  APTT  --   < > 33 106* 139* 95* 124*  LABPROT  --   --  17.5*  --   --   --  16.6*  INR  --   --  1.42  --   --   --  1.33  HEPARINUNFRC  --   < > 1.80* 1.54* 1.39*  --  0.88*  CREATININE 0.89  --   --  0.87  --   --   --   TROPONINI <0.03  --   --   --   --   --   --   < > = values in this interval not displayed.  Estimated Creatinine Clearance: 80.5 mL/min (by C-G formula based on SCr of 0.87 mg/dL).   Assessment: Pt is a 60 year old male with a hx of DVT, currently on apixaban, found to have a PE. Per pt, dose of eliquis was recently increased to 10mg   BID. Per patient last dose of apixaban (10mg ) was 8/7 around 0900. Spoke with Dr. Allena KatzPatel, due to the acute nature of the PE, would like to start heparin drip now as opposed to 12 hr after last dose. Therefore will not bolus. Baseline APTT, INR, CBC and heparin level have been ordered and are in progress.  Baseline heparin level 1.8  Goal of Therapy:  Heparin level 0.3-0.7 units/ml Monitor platelets by anticoagulation protocol: Yes  Goal aPTT 66-102.   Plan:  Start heparin infusion at 1100 units/hr Check anti-Xa level in 6 hours and daily while on heparin Continue to monitor H&H and platelets  Will most likely need to dose based off of APTT until HL and APTT correlate. Follow up on baseline HL.  8/8 02:30 heparin level 1.54, aPTT 106. Decrease rate to 1000 units/hr and recheck in 6 hours.  8/8 1047 heparin level 1.39, apTT 139. RN confirmed heparin drip currently running at 10 ml/hr; no line issues or s/sx of bleeding noted. Hold heparin drip for 30 min and decrease rate to 900 units/hr (=9 ml/hr).  Recheck aPTT in 6 h. Heparin level and CBC in AM. Spoke to RN regarding plan to hold heparin drip for 30 min and to restart it at 9 ml/hr.   8/8 21:  APTT 95. Continue current regimen and recheck with heparin level in AM.   8/9 0616 aPTT elevated at 124, HL 0.88. Appears that labs may be starting to correlate. RN confirms heparin drip running at 9 ml/hr; no line issues or s/sx of bleeding noted. Decrease heparin drip to 800 units/hr (8 ml/hr). Hgb and plt count stable from yesterday.  Just as heparin drip was being changed, MD ordered heparin drip to be discontinued and pt to be started on enoxaparin 65 mg SQ q12h (1 mg/kg SQ BID). Warfarin consult also discontinued by MD. Jeanene Erb RN and informed him that enoxaparin needs to be given 1 h after heparin drip is turned off.   Crist Fat L 03/17/2016,8:25 AM

## 2016-03-17 NOTE — Progress Notes (Signed)
Received MD order to discharge patient back to [facility], report called to Southern CompanySydnee Doyle LPN

## 2016-03-17 NOTE — Clinical Social Work Note (Signed)
Physician to discharge patient today to return to [facility]. CSW spoke with patient and he is in agreement. Patient called his sister while CSW in his room and patient's sister is aware of discharge. CSW is aware that it is documented that patient's leg will require elevation, thus EMS will be needed for transport. Nurse to call report to facility. York SpanielMonica Marikay Roads MSW,LCSW [phone]

## 2016-03-17 NOTE — Clinical Social Work Note (Signed)
CSW aware of potential discharge back to [facility] today. CSW awaiting MD to complete discharge. York SpanielMonica Terrelle Ruffolo MSW,LCSW [phone]

## 2016-03-17 NOTE — Progress Notes (Signed)
At approximately 2130 and 2135 pt had two separate instances of runs of SVT.  Pt asymptomatic and resting.

## 2016-03-17 NOTE — Discharge Summary (Signed)
[facility] [place] [facility]   PATIENT NAME: Brian Hampton    MR#:  [id number]  DATE OF BIRTH:  1955/08/10  DATE OF ADMISSION:  03/15/2016 ADMITTING PHYSICIAN: Auburn Bilberry, MD  DATE OF DISCHARGE: No discharge date for patient encounter.  PRIMARY CARE PHYSICIAN: [facility]     ADMISSION DIAGNOSIS:  Other acute pulmonary embolism with acute cor pulmonale (HCC) [I26.09]  DISCHARGE DIAGNOSIS:  Principal Problem:   Acute pulmonary embolism (HCC) Active Problems:   S/P IVC filter   SOB (shortness of breath)   Right leg DVT (HCC)   Hypokalemia   SECONDARY DIAGNOSIS:   Past Medical History:  Diagnosis Date  . Anemia   . Asthma   . Collagen vascular disease (HCC)   . COPD (chronic obstructive pulmonary disease) (HCC)   . Coronary artery disease   . Emphysema/COPD (HCC)   . HIV (human immunodeficiency virus infection) (HCC)   . Hypertension   . Lung mass   . Myocardial infarction (HCC)    in 2000  . Type 2 diabetes mellitus (HCC)     .pro HOSPITAL COURSE:   The patient is a 60 year old Caucasian male with past medical history significant for history of emphysema/COPD, HIV, essential hypertension, MI, diabetes mellitus, who presents to the hospital with complaints of shortness of breath, right-sided chest pain. Apparently patient was diagnosed with DVT about 2 months ago, was initiated on Coumadin, then  changed to Eliquis because of worsening right lower extremity swelling/suspected progression of DVT. He did not improve on this medication and developed shortness of breath and chest discomfort, he was sent to emergency room for further evaluation and treatment. In emergency room, CT angiogram of the chest revealed pulmonary embolism, he was initiated on IV heparin. Patient was seen by vascular surgeon, who recommended IVC filter placement, but not thrombectomy due to chronicity of clots. IVC filter was placed 03/16/2016 by Dr.  Gilda Crease. Postprocedure, patient did well, complained of no significant discomfort, but mild right sided chest pains. Patient was seen by oncologist, Dr. Donneta Romberg who recommended to continue Lovenox at 1 mg twice daily dose subcutaneously twice a day to failure of Eliquis. According to oncologist, patient will likely need indefinite anticoagulation due to multiple DVTs/PEs. Discussion by problem: #1. Acute pulmonary embolism . Start patient on Lovenox at 1 mg/kg subcutaneous dose twice a, patient will likely need indefinite therapy per oncologist . Pain medications as needed for chest pain control. #2. Right lower extremity DVT, likely chronic, according to vascular surgeon, no thrombectomy was recommended, but IVC filter placement, which was performed 03/16/2016 #3. Hypokalemia, supplemented orally, resolved, magnesium level was low, supplemented, normalized #4 anemia, order for Hemoccult was placed but not reported, it is recommended to follow patient's hemoglobin level since he is on chronic anticoagulation. DISCHARGE CONDITIONS:   Stable  CONSULTS OBTAINED:  Treatment Team:  Renford Dills, MD Earna Coder, MD  DRUG ALLERGIES:   Allergies  Allergen Reactions  . Aspirin Anaphylaxis  . Bee Venom Anaphylaxis  . Penicillins Anaphylaxis and Other (See Comments)    Has patient had a PCN reaction causing immediate rash, facial/tongue/throat swelling, SOB or lightheadedness with hypotension: Yes Has patient had a PCN reaction causing severe rash involving mucus membranes or skin necrosis: No Has patient had a PCN reaction that required hospitalization No Has patient had a PCN reaction occurring within the last 10 years: Yes If all of the above answers are "NO", then may proceed with Cephalosporin  use.  . Prednisone Anaphylaxis  . Sulfa Antibiotics Anaphylaxis  . Sulfasalazine Anaphylaxis  . Theophyllines Anaphylaxis  . Theophylline Swelling    DISCHARGE MEDICATIONS:    Current Discharge Medication List    START taking these medications   Details  enoxaparin (LOVENOX) 150 MG/ML injection Inject 0.44 mLs (65 mg total) into the skin every 12 (twelve) hours. Qty: 60 Syringe, Refills: 6      CONTINUE these medications which have CHANGED   Details  traMADol (ULTRAM) 50 MG tablet Take 1 tablet (50 mg total) by mouth every 6 (six) hours as needed. Qty: 30 tablet, Refills: 0    zolpidem (AMBIEN CR) 12.5 MG CR tablet Take 1 tablet (12.5 mg total) by mouth at bedtime as needed for sleep. Qty: 30 tablet, Refills: 0      CONTINUE these medications which have NOT CHANGED   Details  abacavir-dolutegravir-lamiVUDine (TRIUMEQ) 600-50-300 MG tablet Take 1 tablet by mouth daily. Reported on 01/30/2016    albuterol (PROVENTIL HFA;VENTOLIN HFA) 108 (90 BASE) MCG/ACT inhaler Inhale 2 puffs into the lungs every 6 (six) hours as needed for wheezing or shortness of breath.    albuterol (PROVENTIL) (2.5 MG/3ML) 0.083% nebulizer solution Take 2.5 mg by nebulization every 6 (six) hours as needed for wheezing or shortness of breath.    atorvastatin (LIPITOR) 40 MG tablet Take 40 mg by mouth at bedtime.    Calcium Carbonate-Vitamin D (CALCIUM 600+D) 600-400 MG-UNIT tablet Take 1 tablet by mouth 2 (two) times daily.    carvedilol (COREG) 12.5 MG tablet Take 12.5 mg by mouth 2 (two) times daily with a meal.    cetirizine (ZYRTEC) 10 MG tablet Take 10 mg by mouth daily.    clonazePAM (KLONOPIN) 0.5 MG tablet Take 1 tablet (0.5 mg total) by mouth 2 (two) times daily as needed for anxiety. Qty: 30 tablet, Refills: 0    EPINEPHrine (EPIPEN 2-PAK) 0.3 mg/0.3 mL IJ SOAJ injection Inject 0.3 mg into the muscle once as needed (for severe allergic reaction).    escitalopram (LEXAPRO) 20 MG tablet Take 20 mg by mouth daily.    fluconazole (DIFLUCAN) 200 MG tablet Take 200 mg by mouth daily.    fluticasone (FLONASE) 50 MCG/ACT nasal spray Place 1 spray into both nostrils daily.     gemfibrozil (LOPID) 600 MG tablet Take 600 mg by mouth 2 (two) times daily before a meal.    isosorbide mononitrate (IMDUR) 30 MG 24 hr tablet Take 30 mg by mouth daily.    linaclotide (LINZESS) 290 MCG CAPS capsule Take 290 mcg by mouth daily before breakfast.    meloxicam (MOBIC) 7.5 MG tablet Take 7.5 mg by mouth 2 (two) times daily.    methimazole (TAPAZOLE) 10 MG tablet Take 1 tablet (10 mg total) by mouth daily. Qty: 30 tablet, Refills: 0    mirtazapine (REMERON) 30 MG tablet Take 30 mg by mouth at bedtime.    mometasone-formoterol (DULERA) 200-5 MCG/ACT AERO Inhale 2 puffs into the lungs 2 (two) times daily.     Multiple Vitamin (MULTIVITAMIN WITH MINERALS) TABS tablet Take 1 tablet by mouth daily.    nitroGLYCERIN (NITROSTAT) 0.4 MG SL tablet Place 0.4 mg under the tongue every 5 (five) minutes as needed for chest pain.    omeprazole (PRILOSEC) 20 MG capsule Take 20 mg by mouth 2 (two) times daily before a meal.    senna-docusate (SENOKOT-S) 8.6-50 MG tablet Take 1 tablet by mouth at bedtime as needed for mild constipation. Qty: 30  tablet, Refills: 3    tiotropium (SPIRIVA) 18 MCG inhalation capsule Place 18 mcg into inhaler and inhale daily.      STOP taking these medications     apixaban (ELIQUIS) 5 MG TABS tablet          DISCHARGE INSTRUCTIONS:    Patient is to follow-up with primary care physician, oncologist as outpatient  If you experience worsening of your admission symptoms, develop shortness of breath, life threatening emergency, suicidal or homicidal thoughts you must seek medical attention immediately by calling 911 or calling your MD immediately  if symptoms less severe.  You Must read complete instructions/literature along with all the possible adverse reactions/side effects for all the Medicines you take and that have been prescribed to you. Take any new Medicines after you have completely understood and accept all the possible adverse  reactions/side effects.   Please note  You were cared for by a hospitalist during your hospital stay. If you have any questions about your discharge medications or the care you received while you were in the hospital after you are discharged, you can call the unit and asked to speak with the hospitalist on call if the hospitalist that took care of you is not available. Once you are discharged, your primary care physician will handle any further medical issues. Please note that NO REFILLS for any discharge medications will be authorized once you are discharged, as it is imperative that you return to your primary care physician (or establish a relationship with a primary care physician if you do not have one) for your aftercare needs so that they can reassess your need for medications and monitor your lab values.    Today   CHIEF COMPLAINT:   Chief Complaint  Patient presents with  . Shortness of Breath    HISTORY OF PRESENT ILLNESS:  Brian Hampton  is a 60 y.o. male with a known history of emphysema/COPD, HIV, essential hypertension, MI, diabetes mellitus, who presents to the hospital with complaints of shortness of breath, right-sided chest pain. Apparently patient was diagnosed with DVT about 2 months ago, was initiated on Coumadin, then  changed to Eliquis because of worsening right lower extremity swelling/suspected progression of DVT. He did not improve on this medication and developed shortness of breath and chest discomfort, he was sent to emergency room for further evaluation and treatment. In emergency room, CT angiogram of the chest revealed pulmonary embolism, he was initiated on IV heparin. Patient was seen by vascular surgeon, who recommended IVC filter placement, but not thrombectomy due to chronicity of clots. IVC filter was placed 03/16/2016 by Dr. Gilda Crease. Postprocedure, patient did well, complained of no significant discomfort, but mild right sided chest pains. Patient was seen by  oncologist, Dr. Donneta Romberg who recommended to continue Lovenox at 1 mg twice daily dose subcutaneously twice a day to failure of Eliquis. According to oncologist, patient will likely need indefinite anticoagulation due to multiple DVTs/PEs. Discussion by problem: #1. Acute pulmonary embolism . Start patient on Lovenox at 1 mg/kg subcutaneous dose twice a, patient will likely need indefinite therapy per oncologist . Pain medications as needed for chest pain control. #2. Right lower extremity DVT, likely chronic, according to vascular surgeon, no thrombectomy was recommended, but IVC filter placement, which was performed 03/16/2016 #3. Hypokalemia, supplemented orally, resolved, magnesium level was low, supplemented, normalized #4 anemia, order for Hemoccult was placed but not reported, it is recommended to follow patient's hemoglobin level since he is on chronic anticoagulation.  VITAL SIGNS:  Blood pressure 102/61, pulse 86, temperature 97.8 F (36.6 C), temperature source Oral, resp. rate 17, height 5\' 5"  (1.651 m), weight 65.8 kg (145 lb), SpO2 94 %.  I/O:   Intake/Output Summary (Last 24 hours) at 03/17/16 0920 Last data filed at 03/17/16 0753  Gross per 24 hour  Intake                0 ml  Output              275 ml  Net             -275 ml    PHYSICAL EXAMINATION:  GENERAL:  60 y.o.-year-old patient lying in the bed with no acute distress.  EYES: Pupils equal, round, reactive to light and accommodation. No scleral icterus. Extraocular muscles intact.  HEENT: Head atraumatic, normocephalic. Oropharynx and nasopharynx clear.  NECK:  Supple, no jugular venous distention. No thyroid enlargement, no tenderness.  LUNGS: Normal breath sounds bilaterally, no wheezing, rales,rhonchi or crepitation. No use of accessory muscles of respiration.  CARDIOVASCULAR: S1, S2 normal. No murmurs, rubs, or gallops.  ABDOMEN: Soft, non-tender, non-distended. Bowel sounds present. No organomegaly or mass.   EXTREMITIES: 1-2+ lower extremity and pedal edema, more pronounced on the right, no cyanosis, or clubbing.  NEUROLOGIC: Cranial nerves II through XII are intact. Muscle strength 5/5 in all extremities. Sensation intact. Gait not checked.  PSYCHIATRIC: The patient is alert and oriented x 3.  SKIN: No obvious rash, lesion, or ulcer.   DATA REVIEW:   CBC  Recent Labs Lab 03/17/16 0616  WBC 5.3  HGB 11.0*  HCT 32.2*  PLT 301    Chemistries   Recent Labs Lab 03/15/16 1549 03/16/16 0236  03/17/16 0616  NA 140 142  --   --   K 3.6 3.3*  --  4.7  CL 108 112*  --   --   CO2 26 24  --   --   GLUCOSE 121* 87  --   --   BUN 7 7  --   --   CREATININE 0.89 0.87  --   --   CALCIUM 8.6* 8.3*  --   --   MG  --   --   < > 2.4  AST 37  --   --   --   ALT 18  --   --   --   ALKPHOS 162*  --   --   --   BILITOT 0.4  --   --   --   < > = values in this interval not displayed.  Cardiac Enzymes  Recent Labs Lab 03/15/16 1549  TROPONINI <0.03    Microbiology Results  Results for orders placed or performed during the hospital encounter of 03/15/16  MRSA PCR Screening     Status: None   Collection Time: 03/15/16  8:11 PM  Result Value Ref Range Status   MRSA by PCR NEGATIVE NEGATIVE Final    Comment:        The GeneXpert MRSA Assay (FDA approved for NASAL specimens only), is one component of a comprehensive MRSA colonization surveillance program. It is not intended to diagnose MRSA infection nor to guide or monitor treatment for MRSA infections.     RADIOLOGY:  No results found.  EKG:   Orders placed or performed during the hospital encounter of 03/15/16  . ED EKG  . ED EKG  . EKG 12-Lead  . EKG 12-Lead  Management plans discussed with the patient, family and they are in agreement.  CODE STATUS:     Code Status Orders        Start     Ordered   03/15/16 1743  Do not attempt resuscitation (DNR)  Continuous    Question Answer Comment  In the event  of cardiac or respiratory ARREST Do not call a "code blue"   In the event of cardiac or respiratory ARREST Do not perform Intubation, CPR, defibrillation or ACLS   In the event of cardiac or respiratory ARREST Use medication by any route, position, wound care, and other measures to relive pain and suffering. May use oxygen, suction and manual treatment of airway obstruction as needed for comfort.      03/15/16 1742    Code Status History    Date Active Date Inactive Code Status Order ID Comments User Context   03/15/2016  5:29 PM 03/15/2016  5:42 PM Full Code [id number]  Auburn BilberryShreyang Patel, MD ED   01/30/2016 12:55 AM 02/03/2016 12:55 AM DNR [id number]  Gery Prayebby Crosley, MD Inpatient   01/13/2016  9:46 PM 01/16/2016  4:18 PM Full Code [id number]  Shaune PollackQing Chen, MD Inpatient   06/24/2015  7:13 AM 06/25/2015 12:27 PM Full Code [id number]  [name], MD ED    Advance Directive Documentation   Flowsheet Row Most Recent Value  Type of Advance Directive  Healthcare Power of VilasAttorney, Living will [POA: Junious DresserConnie Gibson]  Pre-existing out of facility DNR order (yellow form or pink MOST form)  No data  "MOST" Form in Place?  No data      TOTAL TIME TAKING CARE OF THIS PATIENT: 40 minutes.    Katharina CaperVAICKUTE,Ndea Kilroy M.D on 03/17/2016 at 9:20 AM  Between 7am to 6pm - Pager - [phone]  After 6pm go to [email or web address] - password [email or web address] [facility]  [facility]  Office  [phone]  CC: Primary care physician; [facility]

## 2016-03-19 DIAGNOSIS — I82401 Acute embolism and thrombosis of unspecified deep veins of right lower extremity: Secondary | ICD-10-CM | POA: Diagnosis not present

## 2016-03-19 DIAGNOSIS — Z21 Asymptomatic human immunodeficiency virus [HIV] infection status: Secondary | ICD-10-CM | POA: Diagnosis not present

## 2016-03-19 DIAGNOSIS — Z86711 Personal history of pulmonary embolism: Secondary | ICD-10-CM | POA: Diagnosis not present

## 2016-03-19 DIAGNOSIS — J449 Chronic obstructive pulmonary disease, unspecified: Secondary | ICD-10-CM | POA: Diagnosis not present

## 2016-03-19 DIAGNOSIS — R918 Other nonspecific abnormal finding of lung field: Secondary | ICD-10-CM | POA: Diagnosis not present

## 2016-04-06 DIAGNOSIS — Z21 Asymptomatic human immunodeficiency virus [HIV] infection status: Secondary | ICD-10-CM | POA: Diagnosis not present

## 2016-04-06 DIAGNOSIS — S51001S Unspecified open wound of right elbow, sequela: Secondary | ICD-10-CM | POA: Diagnosis not present

## 2016-04-06 DIAGNOSIS — J449 Chronic obstructive pulmonary disease, unspecified: Secondary | ICD-10-CM | POA: Diagnosis not present

## 2016-04-06 DIAGNOSIS — I10 Essential (primary) hypertension: Secondary | ICD-10-CM | POA: Diagnosis not present

## 2016-04-13 DIAGNOSIS — G4700 Insomnia, unspecified: Secondary | ICD-10-CM | POA: Diagnosis not present

## 2016-04-13 DIAGNOSIS — F419 Anxiety disorder, unspecified: Secondary | ICD-10-CM | POA: Diagnosis not present

## 2016-04-13 DIAGNOSIS — J449 Chronic obstructive pulmonary disease, unspecified: Secondary | ICD-10-CM | POA: Diagnosis not present

## 2016-04-13 DIAGNOSIS — Z21 Asymptomatic human immunodeficiency virus [HIV] infection status: Secondary | ICD-10-CM | POA: Diagnosis not present

## 2016-04-13 DIAGNOSIS — I2699 Other pulmonary embolism without acute cor pulmonale: Secondary | ICD-10-CM | POA: Diagnosis not present

## 2016-04-13 DIAGNOSIS — G8929 Other chronic pain: Secondary | ICD-10-CM | POA: Diagnosis not present

## 2016-04-13 DIAGNOSIS — I10 Essential (primary) hypertension: Secondary | ICD-10-CM | POA: Diagnosis not present

## 2016-04-13 DIAGNOSIS — K219 Gastro-esophageal reflux disease without esophagitis: Secondary | ICD-10-CM | POA: Diagnosis not present

## 2016-04-13 DIAGNOSIS — E785 Hyperlipidemia, unspecified: Secondary | ICD-10-CM | POA: Diagnosis not present

## 2016-04-13 DIAGNOSIS — R0602 Shortness of breath: Secondary | ICD-10-CM | POA: Diagnosis not present

## 2016-04-13 DIAGNOSIS — R0782 Intercostal pain: Secondary | ICD-10-CM | POA: Diagnosis not present

## 2016-04-13 DIAGNOSIS — I2510 Atherosclerotic heart disease of native coronary artery without angina pectoris: Secondary | ICD-10-CM | POA: Diagnosis not present

## 2016-04-15 DIAGNOSIS — E119 Type 2 diabetes mellitus without complications: Secondary | ICD-10-CM | POA: Diagnosis not present

## 2016-04-15 DIAGNOSIS — M6281 Muscle weakness (generalized): Secondary | ICD-10-CM | POA: Diagnosis not present

## 2016-04-15 DIAGNOSIS — I10 Essential (primary) hypertension: Secondary | ICD-10-CM | POA: Diagnosis not present

## 2016-04-15 DIAGNOSIS — J449 Chronic obstructive pulmonary disease, unspecified: Secondary | ICD-10-CM | POA: Diagnosis not present

## 2016-04-16 DIAGNOSIS — I10 Essential (primary) hypertension: Secondary | ICD-10-CM | POA: Diagnosis not present

## 2016-04-16 DIAGNOSIS — J449 Chronic obstructive pulmonary disease, unspecified: Secondary | ICD-10-CM | POA: Diagnosis not present

## 2016-04-16 DIAGNOSIS — S51001S Unspecified open wound of right elbow, sequela: Secondary | ICD-10-CM | POA: Diagnosis not present

## 2016-04-16 DIAGNOSIS — Z21 Asymptomatic human immunodeficiency virus [HIV] infection status: Secondary | ICD-10-CM | POA: Diagnosis not present

## 2016-04-18 DIAGNOSIS — Z21 Asymptomatic human immunodeficiency virus [HIV] infection status: Secondary | ICD-10-CM | POA: Diagnosis not present

## 2016-04-18 DIAGNOSIS — I2510 Atherosclerotic heart disease of native coronary artery without angina pectoris: Secondary | ICD-10-CM | POA: Diagnosis not present

## 2016-04-18 DIAGNOSIS — I82409 Acute embolism and thrombosis of unspecified deep veins of unspecified lower extremity: Secondary | ICD-10-CM | POA: Diagnosis not present

## 2016-04-18 DIAGNOSIS — I10 Essential (primary) hypertension: Secondary | ICD-10-CM | POA: Diagnosis not present

## 2016-04-18 DIAGNOSIS — J449 Chronic obstructive pulmonary disease, unspecified: Secondary | ICD-10-CM | POA: Diagnosis not present

## 2016-04-18 DIAGNOSIS — F419 Anxiety disorder, unspecified: Secondary | ICD-10-CM | POA: Diagnosis not present

## 2016-04-18 DIAGNOSIS — G4700 Insomnia, unspecified: Secondary | ICD-10-CM | POA: Diagnosis not present

## 2016-04-19 DIAGNOSIS — R252 Cramp and spasm: Secondary | ICD-10-CM | POA: Diagnosis not present

## 2016-04-19 DIAGNOSIS — R202 Paresthesia of skin: Secondary | ICD-10-CM | POA: Diagnosis not present

## 2016-04-19 DIAGNOSIS — G608 Other hereditary and idiopathic neuropathies: Secondary | ICD-10-CM | POA: Diagnosis not present

## 2016-04-27 DIAGNOSIS — S51001D Unspecified open wound of right elbow, subsequent encounter: Secondary | ICD-10-CM | POA: Diagnosis not present

## 2016-04-27 DIAGNOSIS — E119 Type 2 diabetes mellitus without complications: Secondary | ICD-10-CM | POA: Diagnosis not present

## 2016-04-27 DIAGNOSIS — I10 Essential (primary) hypertension: Secondary | ICD-10-CM | POA: Diagnosis not present

## 2016-04-27 DIAGNOSIS — E0590 Thyrotoxicosis, unspecified without thyrotoxic crisis or storm: Secondary | ICD-10-CM | POA: Diagnosis not present

## 2016-04-27 DIAGNOSIS — I2609 Other pulmonary embolism with acute cor pulmonale: Secondary | ICD-10-CM | POA: Diagnosis not present

## 2016-04-27 DIAGNOSIS — Z9181 History of falling: Secondary | ICD-10-CM | POA: Diagnosis not present

## 2016-04-27 DIAGNOSIS — T380X5D Adverse effect of glucocorticoids and synthetic analogues, subsequent encounter: Secondary | ICD-10-CM | POA: Diagnosis not present

## 2016-04-27 DIAGNOSIS — E785 Hyperlipidemia, unspecified: Secondary | ICD-10-CM | POA: Diagnosis not present

## 2016-04-27 DIAGNOSIS — K589 Irritable bowel syndrome without diarrhea: Secondary | ICD-10-CM | POA: Diagnosis not present

## 2016-04-27 DIAGNOSIS — M79604 Pain in right leg: Secondary | ICD-10-CM | POA: Diagnosis not present

## 2016-04-27 DIAGNOSIS — G20 Parkinson's disease: Secondary | ICD-10-CM | POA: Diagnosis not present

## 2016-04-27 DIAGNOSIS — R1312 Dysphagia, oropharyngeal phase: Secondary | ICD-10-CM | POA: Diagnosis not present

## 2016-04-27 DIAGNOSIS — F339 Major depressive disorder, recurrent, unspecified: Secondary | ICD-10-CM | POA: Diagnosis not present

## 2016-04-27 DIAGNOSIS — Z87891 Personal history of nicotine dependence: Secondary | ICD-10-CM | POA: Diagnosis not present

## 2016-04-27 DIAGNOSIS — I252 Old myocardial infarction: Secondary | ICD-10-CM | POA: Diagnosis not present

## 2016-04-27 DIAGNOSIS — I82501 Chronic embolism and thrombosis of unspecified deep veins of right lower extremity: Secondary | ICD-10-CM | POA: Diagnosis not present

## 2016-04-27 DIAGNOSIS — G720 Drug-induced myopathy: Secondary | ICD-10-CM | POA: Diagnosis not present

## 2016-04-27 DIAGNOSIS — B20 Human immunodeficiency virus [HIV] disease: Secondary | ICD-10-CM | POA: Diagnosis not present

## 2016-04-27 DIAGNOSIS — D649 Anemia, unspecified: Secondary | ICD-10-CM | POA: Diagnosis not present

## 2016-04-27 DIAGNOSIS — J449 Chronic obstructive pulmonary disease, unspecified: Secondary | ICD-10-CM | POA: Diagnosis not present

## 2016-04-27 DIAGNOSIS — L03113 Cellulitis of right upper limb: Secondary | ICD-10-CM | POA: Diagnosis not present

## 2016-04-27 DIAGNOSIS — I2510 Atherosclerotic heart disease of native coronary artery without angina pectoris: Secondary | ICD-10-CM | POA: Diagnosis not present

## 2016-04-28 DIAGNOSIS — S51001D Unspecified open wound of right elbow, subsequent encounter: Secondary | ICD-10-CM | POA: Diagnosis not present

## 2016-04-28 DIAGNOSIS — J449 Chronic obstructive pulmonary disease, unspecified: Secondary | ICD-10-CM | POA: Diagnosis not present

## 2016-04-28 DIAGNOSIS — M79604 Pain in right leg: Secondary | ICD-10-CM | POA: Diagnosis not present

## 2016-04-28 DIAGNOSIS — E119 Type 2 diabetes mellitus without complications: Secondary | ICD-10-CM | POA: Diagnosis not present

## 2016-04-28 DIAGNOSIS — L03113 Cellulitis of right upper limb: Secondary | ICD-10-CM | POA: Diagnosis not present

## 2016-04-28 DIAGNOSIS — I2510 Atherosclerotic heart disease of native coronary artery without angina pectoris: Secondary | ICD-10-CM | POA: Diagnosis not present

## 2016-04-30 DIAGNOSIS — I2699 Other pulmonary embolism without acute cor pulmonale: Secondary | ICD-10-CM | POA: Diagnosis not present

## 2016-04-30 DIAGNOSIS — E785 Hyperlipidemia, unspecified: Secondary | ICD-10-CM | POA: Diagnosis not present

## 2016-04-30 DIAGNOSIS — I2510 Atherosclerotic heart disease of native coronary artery without angina pectoris: Secondary | ICD-10-CM | POA: Diagnosis not present

## 2016-04-30 DIAGNOSIS — K219 Gastro-esophageal reflux disease without esophagitis: Secondary | ICD-10-CM | POA: Diagnosis not present

## 2016-04-30 DIAGNOSIS — I10 Essential (primary) hypertension: Secondary | ICD-10-CM | POA: Diagnosis not present

## 2016-05-03 DIAGNOSIS — Z23 Encounter for immunization: Secondary | ICD-10-CM | POA: Diagnosis not present

## 2016-05-04 ENCOUNTER — Encounter: Payer: Self-pay | Admitting: Emergency Medicine

## 2016-05-04 ENCOUNTER — Emergency Department: Payer: Medicare Other

## 2016-05-04 ENCOUNTER — Emergency Department
Admission: EM | Admit: 2016-05-04 | Discharge: 2016-05-04 | Disposition: A | Discharge status: Home / Self Care | Payer: Medicare Other | Source: Home / Self Care | Attending: Emergency Medicine | Admitting: Emergency Medicine

## 2016-05-04 DIAGNOSIS — J449 Chronic obstructive pulmonary disease, unspecified: Secondary | ICD-10-CM | POA: Insufficient documentation

## 2016-05-04 DIAGNOSIS — X58XXXA Exposure to other specified factors, initial encounter: Secondary | ICD-10-CM | POA: Insufficient documentation

## 2016-05-04 DIAGNOSIS — Y929 Unspecified place or not applicable: Secondary | ICD-10-CM

## 2016-05-04 DIAGNOSIS — I10 Essential (primary) hypertension: Secondary | ICD-10-CM

## 2016-05-04 DIAGNOSIS — Y939 Activity, unspecified: Secondary | ICD-10-CM | POA: Insufficient documentation

## 2016-05-04 DIAGNOSIS — E119 Type 2 diabetes mellitus without complications: Secondary | ICD-10-CM

## 2016-05-04 DIAGNOSIS — M545 Low back pain, unspecified: Secondary | ICD-10-CM

## 2016-05-04 DIAGNOSIS — Y999 Unspecified external cause status: Secondary | ICD-10-CM | POA: Insufficient documentation

## 2016-05-04 DIAGNOSIS — S39012A Strain of muscle, fascia and tendon of lower back, initial encounter: Secondary | ICD-10-CM | POA: Insufficient documentation

## 2016-05-04 DIAGNOSIS — Z87891 Personal history of nicotine dependence: Secondary | ICD-10-CM

## 2016-05-04 DIAGNOSIS — M549 Dorsalgia, unspecified: Secondary | ICD-10-CM | POA: Diagnosis not present

## 2016-05-04 DIAGNOSIS — Z21 Asymptomatic human immunodeficiency virus [HIV] infection status: Secondary | ICD-10-CM | POA: Insufficient documentation

## 2016-05-04 DIAGNOSIS — I2510 Atherosclerotic heart disease of native coronary artery without angina pectoris: Secondary | ICD-10-CM

## 2016-05-04 DIAGNOSIS — T148XXA Other injury of unspecified body region, initial encounter: Secondary | ICD-10-CM

## 2016-05-04 DIAGNOSIS — J45909 Unspecified asthma, uncomplicated: Secondary | ICD-10-CM

## 2016-05-04 NOTE — ED Provider Notes (Signed)
[facility] Emergency Department Provider Note  ____________________________________________  Time seen: Approximately 11:38 AM  I have reviewed the triage vital signs and the nursing notes.   HISTORY  Chief Complaint Back Pain    HPI Brian Hampton is a 60 y.o. male who complains of low back pain that started this morning when he was turning to get out of bed. No falls or injuries. Otherwise in his usual state of health. No numbness tingling or weakness in the lower extremities. No bowel or bladder incontinence or other new symptoms.He does have chronic weakness the legs, and uses a cane to walk, and has still been able to ambulate with pain. No fevers or chills  Pain is worse with movement. Radiates somewhat to the right.   Past Medical History:  Diagnosis Date  . Anemia   . Asthma   . Collagen vascular disease (HCC)   . COPD (chronic obstructive pulmonary disease) (HCC)   . Coronary artery disease   . Emphysema/COPD (HCC)   . HIV (human immunodeficiency virus infection) (HCC)   . Hypertension   . Lung mass   . Myocardial infarction (HCC)    in 2000  . Type 2 diabetes mellitus [facility](HCC)      Patient Active Problem List   Diagnosis Date Noted  . Acute pulmonary embolism (HCC) 03/17/2016  . Right leg DVT (HCC) 03/17/2016  . Hypokalemia 03/17/2016  . S/P IVC filter 03/17/2016  . SOB (shortness of breath) 03/15/2016  . Dysphagia 02/02/2016  . GERD (gastroesophageal reflux disease) 02/02/2016  . Hyperthyroidism 01/31/2016  . 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 Elevated transaminase level 01/31/2016  . Anemia 01/31/2016  . Thrombocytopenia (HCC) 01/31/2016  . Pyuria 01/31/2016  . Weakness 01/29/2016  . HIV (human immunodeficiency virus infection) (HCC) 01/29/2016  . CAD (coronary artery disease) 01/29/2016  . COPD (chronic obstructive pulmonary disease) (HCC) 01/29/2016  . Diabetes mellitus (HCC) 01/29/2016  . Leg weakness, bilateral  01/13/2016  . Chest pain 06/24/2015     Past Surgical History:  Procedure Laterality Date  . PERIPHERAL VASCULAR CATHETERIZATION N/A 03/16/2016   Procedure: IVC Filter Insertion;  Surgeon: Renford DillsGregory G Schnier, MD;  Location: [facility] INVASIVE CV LAB;  Service: Cardiovascular;  Laterality: N/A;     Prior to Admission medications   Medication Sig Start Date End Date Taking? Authorizing Provider  abacavir-dolutegravir-lamiVUDine (TRIUMEQ) 600-50-300 MG tablet Take 1 tablet by mouth daily. Reported on 01/30/2016 01/27/16   Historical Provider, MD  albuterol (PROVENTIL HFA;VENTOLIN HFA) 108 (90 BASE) MCG/ACT inhaler Inhale 2 puffs into the lungs every 6 (six) hours as needed for wheezing or shortness of breath.    Historical Provider, MD  albuterol (PROVENTIL) (2.5 MG/3ML) 0.083% nebulizer solution Take 2.5 mg by nebulization every 6 (six) hours as needed for wheezing or shortness of breath.    Historical Provider, MD  atorvastatin (LIPITOR) 40 MG tablet Take 40 mg by mouth at bedtime.    Historical Provider, MD  Calcium Carbonate-Vitamin D (CALCIUM 600+D) 600-400 MG-UNIT tablet Take 1 tablet by mouth 2 (two) times daily.    Historical Provider, MD  carvedilol (COREG) 12.5 MG tablet Take 12.5 mg by mouth 2 (two) times daily with a meal.    Historical Provider, MD  cetirizine (ZYRTEC) 10 MG tablet Take 10 mg by mouth daily.    Historical Provider, MD  clonazePAM (KLONOPIN) 0.5 MG tablet Take 1 tablet (0.5 mg total) by mouth 2 (two) times daily as needed for anxiety. 01/31/16   Katharina Caperima Vaickute,  MD  enoxaparin (LOVENOX) 150 MG/ML injection Inject 0.44 mLs (65 mg total) into the skin every 12 (twelve) hours. 03/17/16   Katharina Caper, MD  EPINEPHrine (EPIPEN 2-PAK) 0.3 mg/0.3 mL IJ SOAJ injection Inject 0.3 mg into the muscle once as needed (for severe allergic reaction).    Historical Provider, MD  escitalopram (LEXAPRO) 20 MG tablet Take 20 mg by mouth daily.    Historical Provider, MD  fluconazole (DIFLUCAN) 200  MG tablet Take 200 mg by mouth daily.    Historical Provider, MD  fluticasone (FLONASE) 50 MCG/ACT nasal spray Place 1 spray into both nostrils daily.    Historical Provider, MD  gemfibrozil (LOPID) 600 MG tablet Take 600 mg by mouth 2 (two) times daily before a meal.    Historical Provider, MD  isosorbide mononitrate (IMDUR) 30 MG 24 hr tablet Take 30 mg by mouth daily.    Historical Provider, MD  linaclotide (LINZESS) 290 MCG CAPS capsule Take 290 mcg by mouth daily before breakfast.    Historical Provider, MD  meloxicam (MOBIC) 7.5 MG tablet Take 7.5 mg by mouth 2 (two) times daily.    Historical Provider, MD  methimazole (TAPAZOLE) 10 MG tablet Take 1 tablet (10 mg total) by mouth daily. 02/02/16   Katharina Caper, MD  mirtazapine (REMERON) 30 MG tablet Take 30 mg by mouth at bedtime.    Historical Provider, MD  mometasone-formoterol (DULERA) 200-5 MCG/ACT AERO Inhale 2 puffs into the lungs 2 (two) times daily.     Historical Provider, MD  Multiple Vitamin (MULTIVITAMIN WITH MINERALS) TABS tablet Take 1 tablet by mouth daily.    Historical Provider, MD  nitroGLYCERIN (NITROSTAT) 0.4 MG SL tablet Place 0.4 mg under the tongue every 5 (five) minutes as needed for chest pain.    Historical Provider, MD  omeprazole (PRILOSEC) 20 MG capsule Take 20 mg by mouth 2 (two) times daily before a meal.    Historical Provider, MD  senna-docusate (SENOKOT-S) 8.6-50 MG tablet Take 1 tablet by mouth at bedtime as needed for mild constipation. 01/31/16   Katharina Caper, MD  tiotropium (SPIRIVA) 18 MCG inhalation capsule Place 18 mcg into inhaler and inhale daily.    Historical Provider, MD  traMADol (ULTRAM) 50 MG tablet Take 1 tablet (50 mg total) by mouth every 6 (six) hours as needed. 03/17/16   Katharina Caper, MD  zolpidem (AMBIEN CR) 12.5 MG CR tablet Take 1 tablet (12.5 mg total) by mouth at bedtime as needed for sleep. 03/17/16   Katharina Caper, MD     Allergies Aspirin; Bee venom; Penicillins; Prednisone; Sulfa  antibiotics; Sulfasalazine; Theophyllines; and Theophylline   Family History  Problem Relation Age of Onset  . CAD      Social History Social History  Substance Use Topics  . Smoking status: Former Games developer  . Smokeless tobacco: Never Used  . Alcohol use No    Review of Systems  Constitutional:   No fever or chills.  Cardiovascular:   No chest pain. Respiratory:   No dyspnea or cough. Gastrointestinal:   Negative for abdominal pain, vomiting and diarrhea.  Genitourinary:   Negative for dysuria or difficulty urinating. Musculoskeletal:   Low back pain as above Neurological:   Negative for headaches or paresthesias or new wekaness 10-point ROS otherwise negative.  ____________________________________________   PHYSICAL EXAM:  VITAL SIGNS: ED Triage Vitals  Enc Vitals Group     BP 05/04/16 0949 118/74     Pulse Rate 05/04/16 0948 81  Resp 05/04/16 0948 16     Temp 05/04/16 0948 98.7 F (37.1 C)     Temp Source 05/04/16 0948 Oral     SpO2 05/04/16 0948 97 %     Weight 05/04/16 0948 155 lb (70.3 kg)     Height 05/04/16 0948 5\' 5"  (1.651 m)     Head Circumference --      Peak Flow --      Pain Score 05/04/16 0948 10     Pain Loc --      Pain Edu? --      Excl. in GC? --     Vital signs reviewed, nursing assessments reviewed.   Constitutional:   Alert and oriented. Well appearing and in no distress. Eyes:   No scleral icterus. No conjunctival pallor. PERRL. EOMI.  No nystagmus. ENT   Head:   Normocephalic and atraumatic.   Nose:   No congestion/rhinnorhea. No septal hematoma   Mouth/Throat:   MMM, no pharyngeal erythema. No peritonsillar mass.    Neck:   No stridor. No SubQ emphysema. No meningismus. Hematological/Lymphatic/Immunilogical:   No cervical lymphadenopathy. Cardiovascular:   RRR. Symmetric bilateral radial and DP pulses.  No murmurs.  Respiratory:   Normal respiratory effort without tachypnea nor retractions. Breath sounds are clear  and equal bilaterally. No wheezes/rales/rhonchi. Gastrointestinal:   Soft and nontender. Non distended. There is no CVA tenderness.  No rebound, rigidity, or guarding. Genitourinary:   deferred Musculoskeletal:   mild tenderness of the lumbosacral junction and the right SI joint area.no focal midline tenderness. Extremities unremarkable. Neurologic:   Normal speech and language.  CN 2-10 normal. Motor grossly intact. No gross focal neurologic deficits are appreciated.  Skin:    Skin is warm, dry and intact. No rash noted.  No petechiae, purpura, or bullae.  ____________________________________________    LABS (pertinent positives/negatives) (all labs ordered are listed, but only abnormal results are displayed) Labs Reviewed - No data to display ____________________________________________   EKG    ____________________________________________    RADIOLOGY  X-ray lumbar spine unremarkable  ____________________________________________   PROCEDURES Procedures  ____________________________________________   INITIAL IMPRESSION / ASSESSMENT AND PLAN / ED COURSE  Pertinent labs & imaging results that were available during my care of the patient were reviewed by me and considered in my medical decision making (see chart for details).  Patient well appearing no acute distress. Low suspicion for osteomyelitis or discitis or epidural abscess or hematoma. No evidence of cauda equina syndrome. Patient's very well-appearing. X-rays unremarkable. This appears to be a muscle strain from turning and twisting the wrong way while getting out of bed. Patient is ambulatory.FOLLOW_UP PRIMARY CARE    Clinical Course   ____________________________________________   FINAL CLINICAL IMPRESSION(S) / ED DIAGNOSES  Final diagnoses:  Right-sided low back pain without sciatica  Muscle strain       Portions of this note were generated with dragon dictation software. Dictation errors may  occur despite best attempts at proofreading.    Sharman Cheek, MD 05/04/16 1143

## 2016-05-04 NOTE — ED Triage Notes (Signed)
Pt here from home via [facility] with c/o lower back pain that started this AM. EMS reports pt was ambulatory from house to ambulance with the use of a cane. Pt denies any other symptoms at present, pt A&O x4.

## 2016-05-05 ENCOUNTER — Inpatient Hospital Stay
Admission: EM | Admit: 2016-05-05 | Discharge: 2016-05-12 | DRG: 557 | Disposition: A | Discharge status: Home / Self Care | Payer: Medicare Other | Attending: Internal Medicine | Admitting: Internal Medicine

## 2016-05-05 ENCOUNTER — Encounter: Payer: Self-pay | Admitting: Emergency Medicine

## 2016-05-05 ENCOUNTER — Emergency Department: Payer: Medicare Other

## 2016-05-05 DIAGNOSIS — R918 Other nonspecific abnormal finding of lung field: Secondary | ICD-10-CM | POA: Diagnosis present

## 2016-05-05 DIAGNOSIS — R2681 Unsteadiness on feet: Secondary | ICD-10-CM

## 2016-05-05 DIAGNOSIS — Z87891 Personal history of nicotine dependence: Secondary | ICD-10-CM

## 2016-05-05 DIAGNOSIS — W19XXXA Unspecified fall, initial encounter: Secondary | ICD-10-CM | POA: Diagnosis present

## 2016-05-05 DIAGNOSIS — E876 Hypokalemia: Secondary | ICD-10-CM | POA: Diagnosis present

## 2016-05-05 DIAGNOSIS — E1122 Type 2 diabetes mellitus with diabetic chronic kidney disease: Secondary | ICD-10-CM | POA: Diagnosis present

## 2016-05-05 DIAGNOSIS — K219 Gastro-esophageal reflux disease without esophagitis: Secondary | ICD-10-CM | POA: Diagnosis present

## 2016-05-05 DIAGNOSIS — J441 Chronic obstructive pulmonary disease with (acute) exacerbation: Secondary | ICD-10-CM | POA: Diagnosis present

## 2016-05-05 DIAGNOSIS — R062 Wheezing: Secondary | ICD-10-CM | POA: Diagnosis not present

## 2016-05-05 DIAGNOSIS — Z79899 Other long term (current) drug therapy: Secondary | ICD-10-CM

## 2016-05-05 DIAGNOSIS — E872 Acidosis: Secondary | ICD-10-CM | POA: Diagnosis present

## 2016-05-05 DIAGNOSIS — R531 Weakness: Secondary | ICD-10-CM

## 2016-05-05 DIAGNOSIS — X58XXXA Exposure to other specified factors, initial encounter: Secondary | ICD-10-CM | POA: Diagnosis present

## 2016-05-05 DIAGNOSIS — R000 Tachycardia, unspecified: Secondary | ICD-10-CM | POA: Diagnosis not present

## 2016-05-05 DIAGNOSIS — B20 Human immunodeficiency virus [HIV] disease: Secondary | ICD-10-CM | POA: Diagnosis present

## 2016-05-05 DIAGNOSIS — Z66 Do not resuscitate: Secondary | ICD-10-CM | POA: Diagnosis present

## 2016-05-05 DIAGNOSIS — Z882 Allergy status to sulfonamides status: Secondary | ICD-10-CM

## 2016-05-05 DIAGNOSIS — E870 Hyperosmolality and hypernatremia: Secondary | ICD-10-CM | POA: Diagnosis present

## 2016-05-05 DIAGNOSIS — R200 Anesthesia of skin: Secondary | ICD-10-CM | POA: Diagnosis not present

## 2016-05-05 DIAGNOSIS — E874 Mixed disorder of acid-base balance: Secondary | ICD-10-CM | POA: Diagnosis present

## 2016-05-05 DIAGNOSIS — N183 Chronic kidney disease, stage 3 (moderate): Secondary | ICD-10-CM | POA: Diagnosis present

## 2016-05-05 DIAGNOSIS — G4730 Sleep apnea, unspecified: Secondary | ICD-10-CM | POA: Diagnosis present

## 2016-05-05 DIAGNOSIS — I252 Old myocardial infarction: Secondary | ICD-10-CM

## 2016-05-05 DIAGNOSIS — I2510 Atherosclerotic heart disease of native coronary artery without angina pectoris: Secondary | ICD-10-CM | POA: Diagnosis present

## 2016-05-05 DIAGNOSIS — E875 Hyperkalemia: Secondary | ICD-10-CM | POA: Diagnosis not present

## 2016-05-05 DIAGNOSIS — T502X5A Adverse effect of carbonic-anhydrase inhibitors, benzothiadiazides and other diuretics, initial encounter: Secondary | ICD-10-CM | POA: Diagnosis present

## 2016-05-05 DIAGNOSIS — R748 Abnormal levels of other serum enzymes: Secondary | ICD-10-CM | POA: Diagnosis present

## 2016-05-05 DIAGNOSIS — Z91030 Bee allergy status: Secondary | ICD-10-CM

## 2016-05-05 DIAGNOSIS — E1121 Type 2 diabetes mellitus with diabetic nephropathy: Secondary | ICD-10-CM | POA: Diagnosis present

## 2016-05-05 DIAGNOSIS — M6282 Rhabdomyolysis: Secondary | ICD-10-CM | POA: Diagnosis present

## 2016-05-05 DIAGNOSIS — N179 Acute kidney failure, unspecified: Secondary | ICD-10-CM | POA: Diagnosis present

## 2016-05-05 DIAGNOSIS — Z8739 Personal history of other diseases of the musculoskeletal system and connective tissue: Secondary | ICD-10-CM

## 2016-05-05 DIAGNOSIS — Z791 Long term (current) use of non-steroidal anti-inflammatories (NSAID): Secondary | ICD-10-CM

## 2016-05-05 DIAGNOSIS — I739 Peripheral vascular disease, unspecified: Secondary | ICD-10-CM | POA: Diagnosis present

## 2016-05-05 DIAGNOSIS — I129 Hypertensive chronic kidney disease with stage 1 through stage 4 chronic kidney disease, or unspecified chronic kidney disease: Secondary | ICD-10-CM | POA: Diagnosis present

## 2016-05-05 DIAGNOSIS — F329 Major depressive disorder, single episode, unspecified: Secondary | ICD-10-CM | POA: Diagnosis present

## 2016-05-05 DIAGNOSIS — E873 Alkalosis: Secondary | ICD-10-CM | POA: Diagnosis not present

## 2016-05-05 DIAGNOSIS — Z888 Allergy status to other drugs, medicaments and biological substances status: Secondary | ICD-10-CM

## 2016-05-05 DIAGNOSIS — Z886 Allergy status to analgesic agent status: Secondary | ICD-10-CM

## 2016-05-05 DIAGNOSIS — I10 Essential (primary) hypertension: Secondary | ICD-10-CM | POA: Diagnosis not present

## 2016-05-05 DIAGNOSIS — Z880 Allergy status to penicillin: Secondary | ICD-10-CM

## 2016-05-05 DIAGNOSIS — R0602 Shortness of breath: Secondary | ICD-10-CM

## 2016-05-05 DIAGNOSIS — E669 Obesity, unspecified: Secondary | ICD-10-CM | POA: Diagnosis present

## 2016-05-05 DIAGNOSIS — S39012A Strain of muscle, fascia and tendon of lower back, initial encounter: Secondary | ICD-10-CM | POA: Diagnosis not present

## 2016-05-05 DIAGNOSIS — E785 Hyperlipidemia, unspecified: Secondary | ICD-10-CM | POA: Diagnosis present

## 2016-05-05 DIAGNOSIS — J449 Chronic obstructive pulmonary disease, unspecified: Secondary | ICD-10-CM | POA: Diagnosis not present

## 2016-05-05 DIAGNOSIS — Z86718 Personal history of other venous thrombosis and embolism: Secondary | ICD-10-CM

## 2016-05-05 DIAGNOSIS — M545 Low back pain: Secondary | ICD-10-CM | POA: Diagnosis not present

## 2016-05-05 DIAGNOSIS — M6281 Muscle weakness (generalized): Secondary | ICD-10-CM

## 2016-05-05 DIAGNOSIS — R5381 Other malaise: Secondary | ICD-10-CM | POA: Diagnosis present

## 2016-05-05 HISTORY — DX: Acute embolism and thrombosis of unspecified deep veins of unspecified lower extremity: I82.409

## 2016-05-05 LAB — URINALYSIS COMPLETE WITH MICROSCOPIC (ARMC ONLY)
BILIRUBIN URINE: NEGATIVE
Glucose, UA: NEGATIVE mg/dL
KETONES UR: NEGATIVE mg/dL
Leukocytes, UA: NEGATIVE
NITRITE: NEGATIVE
PH: 5 (ref 5.0–8.0)
PROTEIN: 30 mg/dL — AB
Specific Gravity, Urine: 1.012 (ref 1.005–1.030)

## 2016-05-05 LAB — BASIC METABOLIC PANEL
Anion gap: 12 (ref 5–15)
Anion gap: 10 (ref 5–15)
BUN: 13 mg/dL (ref 6–20)
BUN: 14 mg/dL (ref 6–20)
CALCIUM: 7 mg/dL — AB (ref 8.9–10.3)
CALCIUM: 6.6 mg/dL — AB (ref 8.9–10.3)
CHLORIDE: 101 mmol/L (ref 101–111)
CHLORIDE: 102 mmol/L (ref 101–111)
CO2: 33 mmol/L — AB (ref 22–32)
CO2: 34 mmol/L — AB (ref 22–32)
CREATININE: 1.6 mg/dL — AB (ref 0.61–1.24)
CREATININE: 1.41 mg/dL — AB (ref 0.61–1.24)
GFR calc Af Amer: >60 mL/min (ref >60)
GFR calc non Af Amer: 46 mL/min — ABNORMAL LOW (ref >60)
GFR calc non Af Amer: 53 mL/min — ABNORMAL LOW (ref >60)
GFR, EST AFRICAN AMERICAN: 53 mL/min — AB (ref >60)
GLUCOSE: 115 mg/dL — AB (ref 65–99)
Glucose, Bld: 125 mg/dL — ABNORMAL HIGH (ref 65–99)
Potassium: <2 mmol/L — CL (ref 3.5–5.1)
Potassium: <2 mmol/L — CL (ref 3.5–5.1)
Sodium: 146 mmol/L — ABNORMAL HIGH (ref 135–145)
Sodium: 146 mmol/L — ABNORMAL HIGH (ref 135–145)

## 2016-05-05 LAB — CBC
HEMATOCRIT: 39.2 % — AB (ref 40.0–52.0)
HEMOGLOBIN: 13 g/dL (ref 13.0–18.0)
MCH: 32.4 pg (ref 26.0–34.0)
MCHC: 33.1 g/dL (ref 32.0–36.0)
MCV: 97.7 fL (ref 80.0–100.0)
Platelets: 265 10*3/uL (ref 150–440)
RBC: 4.01 MIL/uL — ABNORMAL LOW (ref 4.40–5.90)
RDW: 15.8 % — AB (ref 11.5–14.5)
WBC: 8.6 10*3/uL (ref 3.8–10.6)

## 2016-05-05 LAB — HEPATIC FUNCTION PANEL
ALBUMIN: 3.1 g/dL — AB (ref 3.5–5.0)
ALK PHOS: 62 U/L (ref 38–126)
ALT: 18 U/L (ref 17–63)
AST: 41 U/L (ref 15–41)
BILIRUBIN TOTAL: 0.4 mg/dL (ref 0.3–1.2)
Bilirubin, Direct: 0.1 mg/dL (ref 0.1–0.5)
Indirect Bilirubin: 0.3 mg/dL (ref 0.3–0.9)
TOTAL PROTEIN: 7 g/dL (ref 6.5–8.1)

## 2016-05-05 LAB — TROPONIN I: Troponin I: 0.06 ng/mL (ref <0.03)

## 2016-05-05 LAB — MAGNESIUM: Magnesium: 0.5 mg/dL — CL (ref 1.7–2.4)

## 2016-05-05 LAB — NA AND K (SODIUM & POTASSIUM), RAND UR
Potassium Urine: 36 mmol/L
Sodium, Ur: 101 mmol/L

## 2016-05-05 LAB — LACTIC ACID, PLASMA: Lactic Acid, Venous: 1.9 mmol/L (ref 0.5–1.9)

## 2016-05-05 LAB — CK: Total CK: 2066 U/L — ABNORMAL HIGH (ref 49–397)

## 2016-05-05 MED ORDER — METHIMAZOLE 10 MG PO TABS
10.0000 mg | ORAL_TABLET | Freq: Every day | ORAL | Status: DC
  Administered 2016-05-06 – 2016-05-12 (×7): 10 mg via ORAL
  Filled 2016-05-05 (×7): qty 1

## 2016-05-05 MED ORDER — TIOTROPIUM BROMIDE MONOHYDRATE 18 MCG IN CAPS
18.0000 ug | ORAL_CAPSULE | Freq: Every day | RESPIRATORY_TRACT | Status: DC
  Administered 2016-05-07 – 2016-05-12 (×6): 18 ug via RESPIRATORY_TRACT
  Filled 2016-05-05 (×2): qty 5

## 2016-05-05 MED ORDER — CARVEDILOL 12.5 MG PO TABS
12.5000 mg | ORAL_TABLET | Freq: Two times a day (BID) | ORAL | Status: DC
  Administered 2016-05-05 – 2016-05-07 (×4): 12.5 mg via ORAL
  Filled 2016-05-05 (×4): qty 1

## 2016-05-05 MED ORDER — POTASSIUM CHLORIDE IN NACL 20-0.9 MEQ/L-% IV SOLN
Freq: Once | INTRAVENOUS | Status: AC
  Administered 2016-05-05: 19:00:00 via INTRAVENOUS
  Filled 2016-05-05 (×2): qty 1000

## 2016-05-05 MED ORDER — LINACLOTIDE 290 MCG PO CAPS
290.0000 ug | ORAL_CAPSULE | Freq: Every day | ORAL | Status: DC
  Administered 2016-05-06 – 2016-05-12 (×6): 290 ug via ORAL
  Filled 2016-05-05 (×7): qty 1

## 2016-05-05 MED ORDER — CALCIUM CARBONATE ANTACID 500 MG PO CHEW
1.0000 | CHEWABLE_TABLET | Freq: Once | ORAL | Status: AC
  Administered 2016-05-05: 200 mg via ORAL
  Filled 2016-05-05: qty 1

## 2016-05-05 MED ORDER — MOMETASONE FURO-FORMOTEROL FUM 200-5 MCG/ACT IN AERO
2.0000 | INHALATION_SPRAY | Freq: Two times a day (BID) | RESPIRATORY_TRACT | Status: DC
  Administered 2016-05-05 – 2016-05-12 (×14): 2 via RESPIRATORY_TRACT
  Filled 2016-05-05: qty 8.8

## 2016-05-05 MED ORDER — FLUTICASONE PROPIONATE 50 MCG/ACT NA SUSP
1.0000 | Freq: Every day | NASAL | Status: DC
  Administered 2016-05-07 – 2016-05-12 (×6): 1 via NASAL
  Filled 2016-05-05: qty 16

## 2016-05-05 MED ORDER — MIRTAZAPINE 15 MG PO TABS
30.0000 mg | ORAL_TABLET | Freq: Every day | ORAL | Status: DC
  Administered 2016-05-05: 30 mg via ORAL
  Filled 2016-05-05: qty 2

## 2016-05-05 MED ORDER — ATORVASTATIN CALCIUM 20 MG PO TABS
40.0000 mg | ORAL_TABLET | Freq: Every day | ORAL | Status: DC
  Administered 2016-05-05: 40 mg via ORAL
  Filled 2016-05-05: qty 2

## 2016-05-05 MED ORDER — GEMFIBROZIL 600 MG PO TABS
600.0000 mg | ORAL_TABLET | Freq: Two times a day (BID) | ORAL | Status: DC
  Administered 2016-05-06: 600 mg via ORAL
  Filled 2016-05-05 (×2): qty 1

## 2016-05-05 MED ORDER — ENOXAPARIN SODIUM 150 MG/ML ~~LOC~~ SOLN
1.0000 mg/kg | Freq: Two times a day (BID) | SUBCUTANEOUS | Status: DC
  Filled 2016-05-05: qty 0.44

## 2016-05-05 MED ORDER — ENOXAPARIN SODIUM 80 MG/0.8ML ~~LOC~~ SOLN
1.0000 mg/kg | Freq: Two times a day (BID) | SUBCUTANEOUS | Status: DC
  Administered 2016-05-06: 70 mg via SUBCUTANEOUS
  Filled 2016-05-05: qty 0.47
  Filled 2016-05-05: qty 0.8

## 2016-05-05 MED ORDER — LORATADINE 10 MG PO TABS
10.0000 mg | ORAL_TABLET | Freq: Every day | ORAL | Status: DC
Start: 2016-05-06 — End: 2016-05-12
  Administered 2016-05-06 – 2016-05-12 (×7): 10 mg via ORAL
  Filled 2016-05-05 (×7): qty 1

## 2016-05-05 MED ORDER — FLUCONAZOLE 200 MG PO TABS
200.0000 mg | ORAL_TABLET | Freq: Every day | ORAL | Status: DC
  Administered 2016-05-07 – 2016-05-12 (×6): 200 mg via ORAL
  Filled 2016-05-05 (×7): qty 1

## 2016-05-05 MED ORDER — ABACAVIR-DOLUTEGRAVIR-LAMIVUD 600-50-300 MG PO TABS
1.0000 | ORAL_TABLET | Freq: Every day | ORAL | Status: DC
  Administered 2016-05-06 – 2016-05-12 (×7): 1 via ORAL
  Filled 2016-05-05 (×8): qty 1

## 2016-05-05 MED ORDER — KCL IN DEXTROSE-NACL 20-5-0.9 MEQ/L-%-% IV SOLN
Freq: Once | INTRAVENOUS | Status: AC
  Administered 2016-05-05: 19:00:00 via INTRAVENOUS
  Filled 2016-05-05: qty 1000

## 2016-05-05 MED ORDER — CALCIUM CARBONATE-VITAMIN D 500-200 MG-UNIT PO TABS
1.0000 | ORAL_TABLET | Freq: Two times a day (BID) | ORAL | Status: DC
  Administered 2016-05-05 – 2016-05-12 (×14): 1 via ORAL
  Filled 2016-05-05 (×14): qty 1

## 2016-05-05 MED ORDER — ADULT MULTIVITAMIN W/MINERALS CH
1.0000 | ORAL_TABLET | Freq: Every day | ORAL | Status: DC
  Administered 2016-05-06 – 2016-05-12 (×7): 1 via ORAL
  Filled 2016-05-05 (×7): qty 1

## 2016-05-05 MED ORDER — CLONAZEPAM 0.5 MG PO TABS: 0.5000 mg | ORAL_TABLET | Freq: Two times a day (BID) | ORAL | Status: DC | PRN

## 2016-05-05 MED ORDER — PANTOPRAZOLE SODIUM 40 MG PO TBEC
40.0000 mg | DELAYED_RELEASE_TABLET | Freq: Every day | ORAL | Status: DC
  Administered 2016-05-06 – 2016-05-12 (×7): 40 mg via ORAL
  Filled 2016-05-05 (×7): qty 1

## 2016-05-05 MED ORDER — POTASSIUM CHLORIDE IN NACL 20-0.9 MEQ/L-% IV SOLN
Freq: Once | INTRAVENOUS | Status: AC
  Administered 2016-05-05: 20:00:00 via INTRAVENOUS
  Filled 2016-05-05: qty 1000

## 2016-05-05 MED ORDER — POTASSIUM CHLORIDE 20 MEQ PO PACK
40.0000 meq | PACK | Freq: Two times a day (BID) | ORAL | Status: DC
  Administered 2016-05-05 – 2016-05-06 (×2): 40 meq via ORAL
  Filled 2016-05-05 (×2): qty 2

## 2016-05-05 MED ORDER — NITROGLYCERIN 0.4 MG SL SUBL
0.4000 mg | SUBLINGUAL_TABLET | SUBLINGUAL | Status: DC | PRN
  Administered 2016-05-06 – 2016-05-11 (×2): 0.4 mg via SUBLINGUAL
  Filled 2016-05-05 (×4): qty 1
  Filled 2016-05-05: qty 3
  Filled 2016-05-05: qty 1

## 2016-05-05 MED ORDER — TRAMADOL HCL 50 MG PO TABS
50.0000 mg | ORAL_TABLET | Freq: Four times a day (QID) | ORAL | Status: DC | PRN
  Administered 2016-05-06 – 2016-05-11 (×6): 50 mg via ORAL
  Filled 2016-05-05 (×6): qty 1

## 2016-05-05 MED ORDER — ALBUTEROL SULFATE HFA 108 (90 BASE) MCG/ACT IN AERS: 2.0000 | INHALATION_SPRAY | Freq: Four times a day (QID) | RESPIRATORY_TRACT | Status: DC | PRN

## 2016-05-05 MED ORDER — POTASSIUM CHLORIDE CRYS ER 20 MEQ PO TBCR
40.0000 meq | EXTENDED_RELEASE_TABLET | Freq: Once | ORAL | Status: AC
  Administered 2016-05-05: 40 meq via ORAL
  Filled 2016-05-05: qty 2

## 2016-05-05 MED ORDER — SODIUM CHLORIDE 0.9% FLUSH
3.0000 mL | Freq: Two times a day (BID) | INTRAVENOUS | Status: DC
  Administered 2016-05-06 – 2016-05-12 (×11): 3 mL via INTRAVENOUS

## 2016-05-05 MED ORDER — ALBUTEROL SULFATE (2.5 MG/3ML) 0.083% IN NEBU
2.5000 mg | INHALATION_SOLUTION | Freq: Four times a day (QID) | RESPIRATORY_TRACT | Status: DC | PRN
  Administered 2016-05-07: 2.5 mg via RESPIRATORY_TRACT
  Filled 2016-05-05: qty 3

## 2016-05-05 MED ORDER — MAGNESIUM SULFATE 2 GM/50ML IV SOLN
2.0000 g | Freq: Once | INTRAVENOUS | Status: AC
  Administered 2016-05-05: 2 g via INTRAVENOUS
  Filled 2016-05-05: qty 50

## 2016-05-05 NOTE — ED Provider Notes (Signed)
[facility] Emergency Department Provider Note    First MD Initiated Contact with Patient 05/05/16 1746     (approximate)  I have reviewed the triage vital signs and the nursing notes.   HISTORY  Chief Complaint Weakness    HPI Brian Hampton is a 60 y.o. 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 Patient denies any nausea or vomiting. No shortness of breath. Has chronic back pain. Patient was seen in ER yesterday for chronic back pain was told to take NSAIDs and Tylenol. Denies any recent changes to his medications. Denies any overdose or ingestion. States that he woke up this morning unable to ambulate due to severe diffuse weakness. He was on second-story apartment and EMS was called to be transferred to the ER.   Past Medical History:  Diagnosis Date  . Anemia   . Asthma   . Collagen vascular disease (HCC)   . COPD (chronic obstructive pulmonary disease) (HCC)   . Coronary artery disease   . Emphysema/COPD (HCC)   . HIV (human immunodeficiency virus infection) (HCC)   . Hypertension   . Lung mass   . Myocardial infarction (HCC)    in 2000  . Type 2 diabetes mellitus [facility])     Patient Active Problem List   Diagnosis Date Noted  . Acute pulmonary embolism (HCC) 03/17/2016  . Right leg DVT (HCC) 03/17/2016  . Hypokalemia 03/17/2016  . S/P IVC filter 03/17/2016  . SOB (shortness of breath) 03/15/2016  . Dysphagia 02/02/2016  . GERD (gastroesophageal reflux disease) 02/02/2016  . Hyperthyroidism 01/31/2016  . 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 Elevated transaminase level 01/31/2016  . Anemia 01/31/2016  . Thrombocytopenia (HCC) 01/31/2016  . Pyuria 01/31/2016  . Weakness 01/29/2016  . HIV (human immunodeficiency virus  infection) (HCC) 01/29/2016  . CAD (coronary artery disease) 01/29/2016  . COPD (chronic obstructive pulmonary disease) (HCC) 01/29/2016  . Diabetes mellitus (HCC) 01/29/2016  . Leg weakness, bilateral 01/13/2016  . Chest pain 06/24/2015    Past Surgical History:  Procedure Laterality Date  . PERIPHERAL VASCULAR CATHETERIZATION N/A 03/16/2016   Procedure: IVC Filter Insertion;  Surgeon: Renford Dills, MD;  Location: [facility] INVASIVE CV LAB;  Service: Cardiovascular;  Laterality: N/A;    Prior to Admission medications   Medication Sig Start Date End Date Taking? Authorizing Provider  abacavir-dolutegravir-lamiVUDine (TRIUMEQ) 600-50-300 MG tablet Take 1 tablet by mouth daily. Reported on 01/30/2016 01/27/16   Historical Provider, MD  albuterol (PROVENTIL HFA;VENTOLIN HFA) 108 (90 BASE) MCG/ACT inhaler Inhale 2 puffs into the lungs every 6 (six) hours as needed for wheezing or shortness of breath.    Historical Provider, MD  albuterol (PROVENTIL) (2.5 MG/3ML) 0.083% nebulizer solution Take 2.5 mg by nebulization every 6 (six) hours as needed for wheezing or shortness of breath.    Historical Provider, MD  atorvastatin (LIPITOR) 40 MG tablet Take 40 mg by mouth at bedtime.    Historical Provider, MD  Calcium Carbonate-Vitamin D (CALCIUM 600+D) 600-400 MG-UNIT tablet Take 1 tablet by mouth 2 (two) times daily.    Historical Provider, MD  carvedilol (COREG) 12.5 MG tablet Take 12.5 mg by mouth 2 (two) times daily with a meal.    Historical Provider, MD  cetirizine (ZYRTEC) 10 MG tablet Take  10 mg by mouth daily.    Historical Provider, MD  clonazePAM (KLONOPIN) 0.5 MG tablet Take 1 tablet (0.5 mg total) by mouth 2 (two) times daily as needed for anxiety. 01/31/16   Katharina Caper, MD  enoxaparin (LOVENOX) 150 MG/ML injection Inject 0.44 mLs (65 mg total) into the skin every 12 (twelve) hours. 03/17/16   Katharina Caper, MD  EPINEPHrine (EPIPEN 2-PAK) 0.3 mg/0.3 mL IJ SOAJ injection Inject 0.3 mg into the  muscle once as needed (for severe allergic reaction).    Historical Provider, MD  escitalopram (LEXAPRO) 20 MG tablet Take 20 mg by mouth daily.    Historical Provider, MD  fluconazole (DIFLUCAN) 200 MG tablet Take 200 mg by mouth daily.    Historical Provider, MD  fluticasone (FLONASE) 50 MCG/ACT nasal spray Place 1 spray into both nostrils daily.    Historical Provider, MD  gemfibrozil (LOPID) 600 MG tablet Take 600 mg by mouth 2 (two) times daily before a meal.    Historical Provider, MD  isosorbide mononitrate (IMDUR) 30 MG 24 hr tablet Take 30 mg by mouth daily.    Historical Provider, MD  linaclotide (LINZESS) 290 MCG CAPS capsule Take 290 mcg by mouth daily before breakfast.    Historical Provider, MD  meloxicam (MOBIC) 7.5 MG tablet Take 7.5 mg by mouth 2 (two) times daily.    Historical Provider, MD  methimazole (TAPAZOLE) 10 MG tablet Take 1 tablet (10 mg total) by mouth daily. 02/02/16   Katharina Caper, MD  mirtazapine (REMERON) 30 MG tablet Take 30 mg by mouth at bedtime.    Historical Provider, MD  mometasone-formoterol (DULERA) 200-5 MCG/ACT AERO Inhale 2 puffs into the lungs 2 (two) times daily.     Historical Provider, MD  Multiple Vitamin (MULTIVITAMIN WITH MINERALS) TABS tablet Take 1 tablet by mouth daily.    Historical Provider, MD  nitroGLYCERIN (NITROSTAT) 0.4 MG SL tablet Place 0.4 mg under the tongue every 5 (five) minutes as needed for chest pain.    Historical Provider, MD  omeprazole (PRILOSEC) 20 MG capsule Take 20 mg by mouth 2 (two) times daily before a meal.    Historical Provider, MD  senna-docusate (SENOKOT-S) 8.6-50 MG tablet Take 1 tablet by mouth at bedtime as needed for mild constipation. 01/31/16   Katharina Caper, MD  tiotropium (SPIRIVA) 18 MCG inhalation capsule Place 18 mcg into inhaler and inhale daily.    Historical Provider, MD  traMADol (ULTRAM) 50 MG tablet Take 1 tablet (50 mg total) by mouth every 6 (six) hours as needed. 03/17/16   Katharina Caper, MD    zolpidem (AMBIEN CR) 12.5 MG CR tablet Take 1 tablet (12.5 mg total) by mouth at bedtime as needed for sleep. 03/17/16   Katharina Caper, MD    Allergies Aspirin; Bee venom; Penicillins; Prednisone; Sulfa antibiotics; Sulfasalazine; Theophyllines; and Theophylline  Family History  Problem Relation Age of Onset  . CAD      Social History Social History  Substance Use Topics  . Smoking status: Former Games developer  . Smokeless tobacco: Never Used  . Alcohol use No    Review of Systems Patient denies headaches, rhinorrhea, blurry vision, numbness, shortness of breath, chest pain, edema, cough, abdominal pain, nausea, vomiting, diarrhea, dysuria, fevers, rashes or hallucinations unless otherwise stated above in HPI. ____________________________________________   PHYSICAL EXAM:  VITAL SIGNS: Vitals:   05/05/16 1627  BP: 111/79  Pulse: 84  Resp: 18  Temp: 98 F (36.7 C)    Constitutional: Alert and  oriented. Ill appearing Eyes: Conjunctivae are pale PERRL. EOMI. Head: Atraumatic. Nose: No congestion/rhinnorhea. Mouth/Throat: Mucous membranes are moist.  Oropharynx non-erythematous. Neck: No stridor. Painless ROM. No cervical spine tenderness to palpation Hematological/Lymphatic/Immunilogical: No cervical lymphadenopathy. Cardiovascular: Normal rate, regular rhythm. Grossly normal heart sounds.  Good peripheral circulation. Respiratory: Normal respiratory effort.  No retractions. Lungs CTAB. Gastrointestinal: Soft and nontender. No distention. No abdominal bruits. No CVA tenderness. Musculoskeletal: Right greater than left bilateral lower extremity edema that is pitting. Patient with 1 out of 5 strength diffusely to bilateral lower extremities. Grossly 2/ 5 strength bilateral upper extremities with a chronic resting tremor.  No joint effusions. Neurologic:  Normal speech and language. No gross focal neurologic deficits are appreciated. No gait instability. Skin:  Skin is warm, dry and  intact. No rash noted. Psychiatric: Mood and affect are normal. Speech and behavior are normal.  ____________________________________________   LABS (all labs ordered are listed, but only abnormal results are displayed)  Results for orders placed or performed during the hospital encounter of 05/05/16 (from the past 24 hour(s))  Basic metabolic panel     Status: Abnormal   Collection Time: 05/05/16  4:32 PM  Result Value Ref Range   Sodium 146 (H) 135 - 145 mmol/L   Potassium <2.0 (LL) 3.5 - 5.1 mmol/L   Chloride 101 101 - 111 mmol/L   CO2 33 (H) 22 - 32 mmol/L   Glucose, Bld 115 (H) 65 - 99 mg/dL   BUN 13 6 - 20 mg/dL   Creatinine, Ser 1.61 (H) 0.61 - 1.24 mg/dL   Calcium 7.0 (L) 8.9 - 10.3 mg/dL   GFR calc non Af Amer 46 (L) >60 mL/min   GFR calc Af Amer 53 (L) >60 mL/min   Anion gap 12 5 - 15  CBC     Status: Abnormal   Collection Time: 05/05/16  4:32 PM  Result Value Ref Range   WBC 8.6 3.8 - 10.6 K/uL   RBC 4.01 (L) 4.40 - 5.90 MIL/uL   Hemoglobin 13.0 13.0 - 18.0 g/dL   HCT 09.6 (L) 04.5 - 40.9 %   MCV 97.7 80.0 - 100.0 fL   MCH 32.4 26.0 - 34.0 pg   MCHC 33.1 32.0 - 36.0 g/dL   RDW 81.1 (H) 91.4 - 78.2 %   Platelets 265 150 - 440 K/uL  Troponin I     Status: Abnormal   Collection Time: 05/05/16  4:32 PM  Result Value Ref Range   Troponin I 0.06 (HH) <0.03 ng/mL   ____________________________________________  EKG My review and personal interpretation at Time: 16:41   Indication: weakness  Rate: 80  Rhythm: nsr Axis: normal Other: non specific st and t wave changes, prolonged QT ____________________________________________  RADIOLOGY  See chart ____________________________________________   PROCEDURES  Procedure(s) performed: none    Critical Care performed: yes CRITICAL CARE Performed by: Willy Eddy   Total critical care time: 45 minutes  Critical care time was exclusive of separately billable procedures and treating other  patients.  Critical care was necessary to treat or prevent imminent or life-threatening deterioration.  Critical care was time spent personally by me on the following activities: development of treatment plan with patient and/or surrogate as well as nursing, discussions with consultants, evaluation of patient's response to treatment, examination of patient, obtaining history from patient or surrogate, ordering and performing treatments and interventions, ordering and review of laboratory studies, ordering and review of radiographic studies, pulse oximetry and re-evaluation of patient's condition.  ____________________________________________   INITIAL IMPRESSION / ASSESSMENT AND PLAN / ED COURSE  Pertinent labs & imaging results that were available during my care of the patient were reviewed by me and considered in my medical decision making (see chart for details).  DDX: Dehydration, electrolyte abnormality, congestive heart failure, infection, rhabdo, renal failure  Vivien Prestoommy L Jennette [name] is a 60 y.o. who presents to the ED with chief complaint of diffuse weakness and chronic back pain. Patient with very complex past medical history. Laboratory evaluation sent due to evaluate for the above complaints. Patient afebrile and hemodynamic stable but with profound hypokalemia, magnesium and calcium. Etiology is uncertain. Do feel that his presentation is secondary to these acute lab abnormalities. Differential does include myositis versus intrinsic renal disease including nephrotic syndrome. Less likely secondary to medications based on review of his chart. Patient does have a history of coronary disease and has troponin elevation but do not feel this is ACS and more likely secondary to dehydration and renal insufficiency.  The patient will be placed on continuous pulse oximetry and telemetry for monitoring.  Laboratory evaluation will be sent to evaluate for the above complaints.     Clinical Course  Value  Comment By Time   Review of chart patient's last CD4 count was greater than 500. Willy EddyPatrick Anallely Rosell, MD 09/27 1900  Magnesium: (!!) 0.5 (Reviewed) Willy EddyPatrick Mackay Hanauer, MD 09/27 1941  CK Total: (!) 2,066 (Reviewed) Willy EddyPatrick Aquita Simmering, MD 09/27 1941  Troponin I: (!!) 0.06 (Reviewed) Willy EddyPatrick Jonell Brumbaugh, MD 09/27 1943   No evidence of infection. Very bizarre presentation with elevated CK as well as profound hypokalemia and hypomagnesemia as well as hypocalcemia.  Patient remains hemodynamic stable. Etiology of profound metabolic derangements uncertain at this time however as he is with metabolic alkalosis I am concerned for intrinsic renal losses. Willy EddyPatrick Laketta Soderberg, MD 09/27 1949   Patient was admitted for further evaluation and management.  ____________________________________________   FINAL CLINICAL IMPRESSION(S) / ED DIAGNOSES  Final diagnoses:  Generalized weakness  Hypokalemia  AKI (acute kidney injury) (HCC)  Elevated CK  Hypomagnesemia      NEW MEDICATIONS STARTED DURING THIS VISIT:  New Prescriptions   No medications on file     Note:  This document was prepared using Dragon voice recognition software and may include unintentional dictation errors.    Willy EddyPatrick Nikhil Osei, MD 05/06/16 [phone]

## 2016-05-05 NOTE — ED Triage Notes (Signed)
Patient to ER via [facility] for c/o weakness and "tingling all over". Patient was seen here yesterday for low back pain and told he "had a pulled muscle". Patient lives on [address]nd floor of his apartment building, lives alone. Patient was unable to walk for EMS. Patient was also recently admitted to hospital for DVT/PE and then transferred to [facility] on [address]

## 2016-05-05 NOTE — ED Notes (Signed)
MD at bedside. 

## 2016-05-05 NOTE — ED Triage Notes (Addendum)
Pt here today for weakness all over and tingling all over. Reports he was unable to walk because he feels so weak and is unable to feed himself. Reports hurts to lift his arms. Wants to go back to [facility] so they can feed him and help him. He is able to lift arm in triage for cuff to be put on. Does have HIV

## 2016-05-05 NOTE — H&P (Signed)
[employer] [facility] [place] [facility]   PATIENT NAME: Brian Hampton    MR#:  [id number]  DATE OF BIRTH:  Jan 14, 1956  DATE OF ADMISSION:  05/05/2016  PRIMARY CARE PHYSICIAN: [facility]   REQUESTING/REFERRING PHYSICIAN: Roxan Hockey  CHIEF COMPLAINT:   Chief Complaint  Patient presents with  . Weakness    HISTORY OF PRESENT ILLNESS: Brian Hampton  is a 60 y.o. male with a known history of Asthma, DVT- failed oral agents, on lovenox , HIV, Htn, DM- was taking diuretics at home ( though no mentioned in hom,e meds list yet, need to be updated), started feeling increasingly weak, and today morning fell, could not get up for > 1 hour, brought to ER. Potassium and magnesium very low, CK 2000. Slight renal failure.   PAST MEDICAL HISTORY:   Past Medical History:  Diagnosis Date  . Anemia   . Asthma   . Collagen vascular disease (HCC)   . COPD (chronic obstructive pulmonary disease) (HCC)   . Coronary artery disease   . DVT (deep venous thrombosis) (HCC)   . Emphysema/COPD (HCC)   . HIV (human immunodeficiency virus infection) (HCC)   . Hypertension   . Lung mass   . Myocardial infarction (HCC)    in 2000  . Type 2 diabetes mellitus (HCC)     PAST SURGICAL HISTORY: Past Surgical History:  Procedure Laterality Date  . PERIPHERAL VASCULAR CATHETERIZATION N/A 03/16/2016   Procedure: IVC Filter Insertion;  Surgeon: Renford Dills, MD;  Location: [facility] INVASIVE CV LAB;  Service: Cardiovascular;  Laterality: N/A;    SOCIAL HISTORY:  Social History  Substance Use Topics  . Smoking status: Former Games developer  . Smokeless tobacco: Never Used  . Alcohol use No    FAMILY HISTORY:  Family History  Problem Relation Age of Onset  . CAD      DRUG ALLERGIES:  Allergies  Allergen Reactions  . Aspirin Anaphylaxis  . Bee Venom Anaphylaxis  . Penicillins Anaphylaxis and Other (See Comments)    Has patient had a PCN reaction causing immediate rash,  facial/tongue/throat swelling, SOB or lightheadedness with hypotension: Yes Has patient had a PCN reaction causing severe rash involving mucus membranes or skin necrosis: No Has patient had a PCN reaction that required hospitalization No Has patient had a PCN reaction occurring within the last 10 years: Yes If all of the above answers are "NO", then may proceed with Cephalosporin use.  . Prednisone Anaphylaxis  . Sulfa Antibiotics Anaphylaxis  . Sulfasalazine Anaphylaxis  . Theophyllines Anaphylaxis  . Theophylline Swelling    REVIEW OF SYSTEMS:   CONSTITUTIONAL: No fever,positive for fatigue or weakness.  EYES: No blurred or double vision.  EARS, NOSE, AND THROAT: No tinnitus or ear pain.  RESPIRATORY: No cough, shortness of breath, wheezing or hemoptysis.  CARDIOVASCULAR: No chest pain, orthopnea, edema.  GASTROINTESTINAL: No nausea, vomiting, diarrhea or abdominal pain.  GENITOURINARY: No dysuria, hematuria.  ENDOCRINE: No polyuria, nocturia,  HEMATOLOGY: No anemia, easy bruising or bleeding SKIN: No rash or lesion. MUSCULOSKELETAL: No joint pain or arthritis.   NEUROLOGIC: No tingling, numbness, weakness.  PSYCHIATRY: No anxiety or depression.   MEDICATIONS AT HOME:  Prior to Admission medications   Medication Sig Start Date End Date Taking? Authorizing Provider  abacavir-dolutegravir-lamiVUDine (TRIUMEQ) 600-50-300 MG tablet Take 1 tablet by mouth daily. Reported on 01/30/2016 01/27/16   Historical Provider, MD  albuterol (PROVENTIL HFA;VENTOLIN HFA) 108 (90 BASE) MCG/ACT inhaler Inhale 2 puffs into the  lungs every 6 (six) hours as needed for wheezing or shortness of breath.    Historical Provider, MD  albuterol (PROVENTIL) (2.5 MG/3ML) 0.083% nebulizer solution Take 2.5 mg by nebulization every 6 (six) hours as needed for wheezing or shortness of breath.    Historical Provider, MD  atorvastatin (LIPITOR) 40 MG tablet Take 40 mg by mouth at bedtime.    Historical Provider, MD   Calcium Carbonate-Vitamin D (CALCIUM 600+D) 600-400 MG-UNIT tablet Take 1 tablet by mouth 2 (two) times daily.    Historical Provider, MD  carvedilol (COREG) 12.5 MG tablet Take 12.5 mg by mouth 2 (two) times daily with a meal.    Historical Provider, MD  cetirizine (ZYRTEC) 10 MG tablet Take 10 mg by mouth daily.    Historical Provider, MD  clonazePAM (KLONOPIN) 0.5 MG tablet Take 1 tablet (0.5 mg total) by mouth 2 (two) times daily as needed for anxiety. 01/31/16   Katharina Caper, MD  enoxaparin (LOVENOX) 150 MG/ML injection Inject 0.44 mLs (65 mg total) into the skin every 12 (twelve) hours. 03/17/16   Katharina Caper, MD  EPINEPHrine (EPIPEN 2-PAK) 0.3 mg/0.3 mL IJ SOAJ injection Inject 0.3 mg into the muscle once as needed (for severe allergic reaction).    Historical Provider, MD  escitalopram (LEXAPRO) 20 MG tablet Take 20 mg by mouth daily.    Historical Provider, MD  fluconazole (DIFLUCAN) 200 MG tablet Take 200 mg by mouth daily.    Historical Provider, MD  fluticasone (FLONASE) 50 MCG/ACT nasal spray Place 1 spray into both nostrils daily.    Historical Provider, MD  gemfibrozil (LOPID) 600 MG tablet Take 600 mg by mouth 2 (two) times daily before a meal.    Historical Provider, MD  isosorbide mononitrate (IMDUR) 30 MG 24 hr tablet Take 30 mg by mouth daily.    Historical Provider, MD  linaclotide (LINZESS) 290 MCG CAPS capsule Take 290 mcg by mouth daily before breakfast.    Historical Provider, MD  meloxicam (MOBIC) 7.5 MG tablet Take 7.5 mg by mouth 2 (two) times daily.    Historical Provider, MD  methimazole (TAPAZOLE) 10 MG tablet Take 1 tablet (10 mg total) by mouth daily. 02/02/16   Katharina Caper, MD  mirtazapine (REMERON) 30 MG tablet Take 30 mg by mouth at bedtime.    Historical Provider, MD  mometasone-formoterol (DULERA) 200-5 MCG/ACT AERO Inhale 2 puffs into the lungs 2 (two) times daily.     Historical Provider, MD  Multiple Vitamin (MULTIVITAMIN WITH MINERALS) TABS tablet Take 1  tablet by mouth daily.    Historical Provider, MD  nitroGLYCERIN (NITROSTAT) 0.4 MG SL tablet Place 0.4 mg under the tongue every 5 (five) minutes as needed for chest pain.    Historical Provider, MD  omeprazole (PRILOSEC) 20 MG capsule Take 20 mg by mouth 2 (two) times daily before a meal.    Historical Provider, MD  senna-docusate (SENOKOT-S) 8.6-50 MG tablet Take 1 tablet by mouth at bedtime as needed for mild constipation. 01/31/16   Katharina Caper, MD  tiotropium (SPIRIVA) 18 MCG inhalation capsule Place 18 mcg into inhaler and inhale daily.    Historical Provider, MD  traMADol (ULTRAM) 50 MG tablet Take 1 tablet (50 mg total) by mouth every 6 (six) hours as needed. 03/17/16   Katharina Caper, MD  zolpidem (AMBIEN CR) 12.5 MG CR tablet Take 1 tablet (12.5 mg total) by mouth at bedtime as needed for sleep. 03/17/16   Katharina Caper, MD  PHYSICAL EXAMINATION:   VITAL SIGNS: Blood pressure 104/71, pulse 74, temperature 98 F (36.7 C), temperature source Oral, resp. rate (!) 29, height 5\' 5"  (1.651 m), weight 70.3 kg (155 lb), SpO2 97 %.  GENERAL:  60 y.o.-year-old patient lying in the bed with no acute distress.  EYES: Pupils equal, round, reactive to light and accommodation. No scleral icterus. Extraocular muscles intact.  HEENT: Head atraumatic, normocephalic. Oropharynx and nasopharynx clear.  NECK:  Supple, no jugular venous distention. No thyroid enlargement, no tenderness.  LUNGS: Normal breath sounds bilaterally, no wheezing, rales,rhonchi or crepitation. No use of accessory muscles of respiration.  CARDIOVASCULAR: S1, S2 normal. No murmurs, rubs, or gallops.  ABDOMEN: Soft, nontender, nondistended. Bowel sounds present. No organomegaly or mass.  EXTREMITIES: No pedal edema, cyanosis, or clubbing.  NEUROLOGIC: Cranial nerves II through XII are intact. Muscle strength 4/5 in all extremities. Sensation intact. Gait not checked.  PSYCHIATRIC: The patient is alert and oriented x 3.  SKIN: No  obvious rash, lesion, or ulcer.   LABORATORY PANEL:   CBC  Recent Labs Lab 05/05/16 1632  WBC 8.6  HGB 13.0  HCT 39.2*  PLT 265  MCV 97.7  MCH 32.4  MCHC 33.1  RDW 15.8*   ------------------------------------------------------------------------------------------------------------------  Chemistries   Recent Labs Lab 05/05/16 1632  NA 146*  K <2.0*  CL 101  CO2 33*  GLUCOSE 115*  BUN 13  CREATININE 1.60*  CALCIUM 7.0*  MG 0.5*  AST 41  ALT 18  ALKPHOS 62  BILITOT 0.4   ------------------------------------------------------------------------------------------------------------------ estimated creatinine clearance is 43.2 mL/min (by C-G formula based on SCr of 1.6 mg/dL (H)). ------------------------------------------------------------------------------------------------------------------ No results for input(s): TSH, T4TOTAL, T3FREE, THYROIDAB in the last 72 hours.  Invalid input(s): FREET3   Coagulation profile No results for input(s): INR, PROTIME in the last 168 hours. ------------------------------------------------------------------------------------------------------------------- No results for input(s): DDIMER in the last 72 hours. -------------------------------------------------------------------------------------------------------------------  Cardiac Enzymes  Recent Labs Lab 05/05/16 1632  TROPONINI 0.06*   ------------------------------------------------------------------------------------------------------------------ Invalid input(s): POCBNP  ---------------------------------------------------------------------------------------------------------------  Urinalysis    Component Value Date/Time   COLORURINE YELLOW (A) 05/05/2016 1631   APPEARANCEUR CLEAR (A) 05/05/2016 1631   LABSPEC 1.012 05/05/2016 1631   PHURINE 5.0 05/05/2016 1631   GLUCOSEU NEGATIVE 05/05/2016 1631   HGBUR 2+ (A) 05/05/2016 1631   BILIRUBINUR NEGATIVE 05/05/2016  1631   KETONESUR NEGATIVE 05/05/2016 1631   PROTEINUR 30 (A) 05/05/2016 1631   NITRITE NEGATIVE 05/05/2016 1631   LEUKOCYTESUR NEGATIVE 05/05/2016 1631     RADIOLOGY: Dg Chest 2 View  Result Date: 05/05/2016 CLINICAL DATA:  60 year old male with history of weakness and tingling all over. EXAM: CHEST  2 VIEW COMPARISON:  Chest x-ray 06/24/2015. FINDINGS: Lung volumes are low. No consolidative airspace disease. Mild blunting of both costophrenic sulci noted on the lateral projection, which appears to be chronic, likely related to mild pleuroparenchymal scarring in the lung bases. No pneumothorax. No pulmonary nodule or mass noted. Pulmonary vasculature and the cardiomediastinal silhouette are within normal limits. IVC filter noted on the lateral projection. IMPRESSION: 1. Low lung volumes without radiographic evidence of acute cardiopulmonary disease. Electronically Signed   By: Trudie Reedaniel  Entrikin M.D.   On: 05/05/2016 19:05   Dg Lumbar Spine 2-3 Views  Result Date: 05/04/2016 CLINICAL DATA:  60 year old with acute onset of low back pain with and getting out of bed earlier today. Patient denies radiculopathy. No known injury. EXAM: LUMBAR SPINE - 2-3 VIEW COMPARISON:  Bone window images from CT abdomen and pelvis  01/31/2016. MRI lumbar spine 01/13/2016. FINDINGS: Five non-rib-bearing lumbar vertebrae with T12-small, rudimentary ribs. Anatomic alignment. No fractures. Mild disc space narrowing at L1-2 and L2-3. Moderate disc space narrowing at T11-12 and T12-L1. Bridging anterior osteophytes at L2-3, L3-4 and L4-5. Facet degenerative changes at L4-5 and L5-S1. No interval change. IVC filter with its apex at the mid L1 level, appropriately positioned. 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 No acute osseous abnormality. Electronically Signed   By: Hulan Saas M.D.   On: 05/04/2016 10:46    EKG: Orders placed or performed during the hospital  encounter of 05/05/16  . ED EKG  . ED EKG    IMPRESSION AND PLAN:  * Severe hypokalemia   Severe Hypomagnesemia        Due to Diuretics use    IV fluid with K and oral replacement.    IV mag given.    Recheck tomorrow.    Monitor on tele.  * Rhabdomyolysis   IV fluids and recheck.    Need PT eval.  * ac renal failure   IV fluids.  * DVT   [place] lovenox.  * HIV    Cont home meds.   All the records are reviewed and case discussed with ED provider. Management plans discussed with the patient, family and they are in agreement.  CODE STATUS: DNR Code Status History    Date Active Date Inactive Code Status Order ID Comments User Context   03/15/2016  5:42 PM 03/17/2016  6:59 PM DNR [id number]  Auburn Bilberry, MD ED   03/15/2016  5:29 PM 03/15/2016  5:42 PM Full Code [id number]  Auburn Bilberry, MD ED   01/30/2016 12:55 AM 02/03/2016 12:55 AM DNR [id number]  Gery Pray, MD Inpatient   01/13/2016  9:46 PM 01/16/2016  4:18 PM Full Code [id number]  Shaune Pollack, MD Inpatient   06/24/2015  7:13 AM 06/25/2015 12:27 PM Full Code [id number]  Milagros Loll, MD ED    Questions for Most Recent Historical Code Status (Order [id number])    Question Answer Comment   In the event of cardiac or respiratory ARREST Do not call a "code blue"    In the event of cardiac or respiratory ARREST Do not perform Intubation, CPR, defibrillation or ACLS    In the event of cardiac or respiratory ARREST Use medication by any route, position, wound care, and other measures to relive pain and suffering. May use oxygen, suction and manual treatment of airway obstruction as needed for comfort.        TOTAL TIME TAKING CARE OF THIS PATIENT: 50 minutes.    Altamese Dilling M.D on 05/05/2016   Between 7am to 6pm - Pager - [phone]  After 6pm go to [email or web address] - password [email or web address]  [facility]  Office  [phone]  CC: Primary care physician; [facility]   Note: This  dictation was prepared with Dragon dictation along with smaller phrase technology. Any transcriptional errors that result from this process are unintentional.

## 2016-05-05 NOTE — ED Notes (Signed)
Admitting MD at bedside.

## 2016-05-05 NOTE — Progress Notes (Signed)
Patient arrived to 2A Room 250. Patient denies pain and all questions answered. Patient oriented to unit and Fall Safety Plan signed. Skin assessment completed with Yaakov GuthrieMarcel RN and abrasions noted to right hand, forearm, elbow, and knee from recent fall. A&Ox4, VSS, and NSR on verified tele-box #[id number]. Yellow socks on, call bell within reach, and siderails up x2. Nursing staff will continue to monitor for any changes in patient status.

## 2016-05-06 LAB — CBC
HEMATOCRIT: 32.4 % — AB (ref 40.0–52.0)
Hemoglobin: 11 g/dL — ABNORMAL LOW (ref 13.0–18.0)
MCH: 33.1 pg (ref 26.0–34.0)
MCHC: 33.9 g/dL (ref 32.0–36.0)
MCV: 97.6 fL (ref 80.0–100.0)
Platelets: 215 10*3/uL (ref 150–440)
RBC: 3.32 MIL/uL — ABNORMAL LOW (ref 4.40–5.90)
RDW: 15.7 % — AB (ref 11.5–14.5)
WBC: 6.1 10*3/uL (ref 3.8–10.6)

## 2016-05-06 LAB — MAGNESIUM
MAGNESIUM: 1.1 mg/dL — AB (ref 1.7–2.4)
MAGNESIUM: 1.8 mg/dL (ref 1.7–2.4)

## 2016-05-06 LAB — BASIC METABOLIC PANEL
Anion gap: 5 (ref 5–15)
BUN: 13 mg/dL (ref 6–20)
CALCIUM: 6.4 mg/dL — AB (ref 8.9–10.3)
CO2: 38 mmol/L — ABNORMAL HIGH (ref 22–32)
CREATININE: 1.31 mg/dL — AB (ref 0.61–1.24)
Chloride: 105 mmol/L (ref 101–111)
GFR calc Af Amer: >60 mL/min (ref >60)
GFR, EST NON AFRICAN AMERICAN: 58 mL/min — AB (ref >60)
Glucose, Bld: 90 mg/dL (ref 65–99)
SODIUM: 148 mmol/L — AB (ref 135–145)

## 2016-05-06 LAB — C DIFFICILE QUICK SCREEN W PCR REFLEX
C Diff antigen: NEGATIVE
C Diff interpretation: NOT DETECTED
C Diff toxin: NEGATIVE

## 2016-05-06 LAB — POTASSIUM
Potassium: <2 mmol/L — CL (ref 3.5–5.1)
Potassium: 2.3 mmol/L — CL (ref 3.5–5.1)

## 2016-05-06 LAB — CK: CK TOTAL: 1932 U/L — AB (ref 49–397)

## 2016-05-06 LAB — TSH: TSH: 6.662 u[IU]/mL — AB (ref 0.350–4.500)

## 2016-05-06 MED ORDER — MAGNESIUM SULFATE 2 GM/50ML IV SOLN
2.0000 g | Freq: Once | INTRAVENOUS | Status: AC
  Administered 2016-05-06: 2 g via INTRAVENOUS
  Filled 2016-05-06 (×2): qty 50

## 2016-05-06 MED ORDER — ACETAMINOPHEN 325 MG PO TABS
650.0000 mg | ORAL_TABLET | Freq: Four times a day (QID) | ORAL | Status: DC | PRN
  Administered 2016-05-11: 650 mg via ORAL
  Filled 2016-05-06: qty 2

## 2016-05-06 MED ORDER — POTASSIUM CHLORIDE 2 MEQ/ML IV SOLN
INTRAVENOUS | Status: DC
  Filled 2016-05-06 (×10): qty 500

## 2016-05-06 MED ORDER — SODIUM CHLORIDE 0.9 % IV SOLN
1.0000 g | Freq: Once | INTRAVENOUS | Status: AC
  Administered 2016-05-06: 18:00:00 via INTRAVENOUS
  Filled 2016-05-06: qty 10

## 2016-05-06 MED ORDER — POTASSIUM CHLORIDE 20 MEQ PO PACK
40.0000 meq | PACK | Freq: Three times a day (TID) | ORAL | Status: DC
  Administered 2016-05-06 – 2016-05-07 (×5): 40 meq via ORAL
  Filled 2016-05-06 (×5): qty 2

## 2016-05-06 MED ORDER — POTASSIUM CHLORIDE 10 MEQ/100ML IV SOLN
10.0000 meq | INTRAVENOUS | Status: AC
  Administered 2016-05-06 (×4): 10 meq via INTRAVENOUS
  Filled 2016-05-06 (×4): qty 100

## 2016-05-06 MED ORDER — MIRTAZAPINE 15 MG PO TABS
15.0000 mg | ORAL_TABLET | Freq: Every day | ORAL | Status: DC
  Administered 2016-05-06 – 2016-05-11 (×6): 15 mg via ORAL
  Filled 2016-05-06 (×6): qty 1

## 2016-05-06 MED ORDER — POTASSIUM CHLORIDE 2 MEQ/ML IV SOLN
Freq: Once | INTRAVENOUS | Status: AC
  Administered 2016-05-06: 23:00:00 via INTRAVENOUS
  Filled 2016-05-06: qty 500

## 2016-05-06 MED ORDER — POTASSIUM CHLORIDE IN NACL 20-0.9 MEQ/L-% IV SOLN
INTRAVENOUS | Status: DC
  Administered 2016-05-06: 10:00:00 via INTRAVENOUS
  Filled 2016-05-06 (×3): qty 1000

## 2016-05-06 MED ORDER — ENOXAPARIN SODIUM 80 MG/0.8ML ~~LOC~~ SOLN
1.0000 mg/kg | Freq: Two times a day (BID) | SUBCUTANEOUS | Status: DC
  Administered 2016-05-06 – 2016-05-08 (×5): 75 mg via SUBCUTANEOUS
  Filled 2016-05-06 (×5): qty 0.8

## 2016-05-06 MED ORDER — SODIUM CHLORIDE 0.45 % IV SOLN
INTRAVENOUS | Status: DC
  Administered 2016-05-06 – 2016-05-07 (×2): via INTRAVENOUS
  Filled 2016-05-06 (×5): qty 1000

## 2016-05-06 MED ORDER — SODIUM CHLORIDE 0.9 % IV SOLN
1.0000 g | Freq: Once | INTRAVENOUS | Status: AC
  Administered 2016-05-06: 1 g via INTRAVENOUS
  Filled 2016-05-06: qty 10

## 2016-05-06 MED ORDER — LOPERAMIDE HCL 2 MG PO CAPS
2.0000 mg | ORAL_CAPSULE | ORAL | Status: DC | PRN
  Administered 2016-05-06 – 2016-05-07 (×2): 2 mg via ORAL
  Filled 2016-05-06 (×2): qty 1

## 2016-05-06 MED ORDER — MAGNESIUM SULFATE 2 GM/50ML IV SOLN
2.0000 g | Freq: Once | INTRAVENOUS | Status: AC
  Administered 2016-05-06: 2 g via INTRAVENOUS
  Filled 2016-05-06: qty 50

## 2016-05-06 NOTE — Progress Notes (Addendum)
MEDICATION RELATED CONSULT NOTE - INITIAL   Pharmacy Consult for Electrolyte replacement Indication: Hypo K, Mag, Ca  Allergies  Allergen Reactions  . Aspirin Anaphylaxis  . Bee Venom Anaphylaxis  . Penicillins Anaphylaxis and Other (See Comments)    Has patient had a PCN reaction causing immediate rash, facial/tongue/throat swelling, SOB or lightheadedness with hypotension: Yes Has patient had a PCN reaction causing severe rash involving mucus membranes or skin necrosis: No Has patient had a PCN reaction that required hospitalization No Has patient had a PCN reaction occurring within the last 10 years: Yes If all of the above answers are "NO", then may proceed with Cephalosporin use.  . Prednisone Anaphylaxis  . Sulfa Antibiotics Anaphylaxis  . Sulfasalazine Anaphylaxis  . Theophyllines Anaphylaxis  . Theophylline Swelling    Patient Measurements: Height: 5\' 5"  (165.1 cm) Weight: 165 lb 9.6 oz (75.1 kg) IBW/kg (Calculated) : 61.5   Vital Signs: Temp: 98.3 F (36.8 C) (09/28 2003) Temp Source: Oral (09/28 2003) BP: 104/63 (09/28 2003) Pulse Rate: 78 (09/28 2003) Intake/Output from previous day: 09/27 0701 - 09/28 0700 In: 1039.6 [I.V.:1039.6] Out: 250 [Urine:250] Intake/Output from this shift: No intake/output data recorded.  Labs:  Recent Labs  05/05/16 1632 05/05/16 2004 05/06/16 0410 05/06/16 1059  WBC 8.6  --  6.1  --   HGB 13.0  --  11.0*  --   HCT 39.2*  --  32.4*  --   PLT 265  --  215  --   CREATININE 1.60* 1.41* 1.31*  --   MG 0.5*  --  1.1* 1.8  ALBUMIN 3.1*  --   --   --   PROT 7.0  --   --   --   AST 41  --   --   --   ALT 18  --   --   --   ALKPHOS 62  --   --   --   BILITOT 0.4  --   --   --   BILIDIR 0.1  --   --   --   IBILI 0.3  --   --   --    Estimated Creatinine Clearance: 57.5 mL/min (by C-G formula based on SCr of 1.31 mg/dL (H)).   Microbiology: No results found for this or any previous visit (from the past 720  hour(s)).  Medical History: Past Medical History:  Diagnosis Date  . Anemia   . Asthma   . Collagen vascular disease (HCC)   . COPD (chronic obstructive pulmonary disease) (HCC)   . Coronary artery disease   . DVT (deep venous thrombosis) (HCC)   . Emphysema/COPD (HCC)   . HIV (human immunodeficiency virus infection) (HCC)   . Hypertension   . Lung mass   . Myocardial infarction (HCC)    in 2000  . Type 2 diabetes mellitus (HCC)      Assessment: 60 yo male with K <2.0 despite supplementation. Hypo Mag and HypoCa on admission as well.   K <2.0 at 1600 (before 2nd set of KCl runs were started)  Goal of Therapy:  K 3.5-5.0 Mag ~2   Plan:  Discussed with hospitalist - will change KCl PO order to 40 mEq TID and recheck K after the current 4 KCl runs that are running and another PO dose.  Recheck K at 2200 Plan discussed with RN.   9/28 2202 K 2.3. Ordered potassium chloride 40 mEq in D5W 500 mL IV x 1 at 125 mL/hr (4  hr infusion). Will recheck all electrolytes with AM labs.  9/29 0437 K 2.7, Mg 2.3, phos 2.9, Ca 7.5, alb 3.1 on 9/27 (adjusted approximately 8.2)  1. Repeat potassium chloride 40 mEq in D5W 500 mL IV x 1 at 125 mL/hr (4 hr infusion). Will recheck K after infusion. Patient remains on 0.45% sodium chloride with 40 mEq KCl/liter at 75 mL/hr and potassium chloride 40 mEq po TID. 2. Calcium gluconate 1 gm IV x 1  Carola FrostNathan A Setsuko Robins, Pharm.D., BCPS Clinical Pharmacist 05/06/2016,10:36 PM

## 2016-05-06 NOTE — Progress Notes (Addendum)
MEDICATION RELATED CONSULT NOTE - INITIAL   Pharmacy Consult for Electrolyte replacement Indication: Hypo K, Mag, Ca  Allergies  Allergen Reactions  . Aspirin Anaphylaxis  . Bee Venom Anaphylaxis  . Penicillins Anaphylaxis and Other (See Comments)    Has patient had a PCN reaction causing immediate rash, facial/tongue/throat swelling, SOB or lightheadedness with hypotension: Yes Has patient had a PCN reaction causing severe rash involving mucus membranes or skin necrosis: No Has patient had a PCN reaction that required hospitalization No Has patient had a PCN reaction occurring within the last 10 years: Yes If all of the above answers are "NO", then may proceed with Cephalosporin use.  . Prednisone Anaphylaxis  . Sulfa Antibiotics Anaphylaxis  . Sulfasalazine Anaphylaxis  . Theophyllines Anaphylaxis  . Theophylline Swelling    Patient Measurements: Height: 5\' 5"  (165.1 cm) Weight: 165 lb 9.6 oz (75.1 kg) IBW/kg (Calculated) : 61.5   Vital Signs: Temp: 97.8 F (36.6 C) (09/28 1144) Temp Source: Oral (09/28 1144) BP: 112/64 (09/28 1144) Pulse Rate: 68 (09/28 1144) Intake/Output from previous day: 09/27 0701 - 09/28 0700 In: 1039.6 [I.V.:1039.6] Out: 250 [Urine:250] Intake/Output from this shift: Total I/O In: 1043.8 [P.O.:480; I.V.:363.8; IV Piggyback:200] Out: 250 [Urine:250]  Labs:  Recent Labs  05/05/16 1632 05/05/16 2004 05/06/16 0410 05/06/16 1059  WBC 8.6  --  6.1  --   HGB 13.0  --  11.0*  --   HCT 39.2*  --  32.4*  --   PLT 265  --  215  --   CREATININE 1.60* 1.41* 1.31*  --   MG 0.5*  --  1.1* 1.8  ALBUMIN 3.1*  --   --   --   PROT 7.0  --   --   --   AST 41  --   --   --   ALT 18  --   --   --   ALKPHOS 62  --   --   --   BILITOT 0.4  --   --   --   BILIDIR 0.1  --   --   --   IBILI 0.3  --   --   --    Estimated Creatinine Clearance: 57.5 mL/min (by C-G formula based on SCr of 1.31 mg/dL (H)).   Microbiology: No results found for this or  any previous visit (from the past 720 hour(s)).  Medical History: Past Medical History:  Diagnosis Date  . Anemia   . Asthma   . Collagen vascular disease (HCC)   . COPD (chronic obstructive pulmonary disease) (HCC)   . Coronary artery disease   . DVT (deep venous thrombosis) (HCC)   . Emphysema/COPD (HCC)   . HIV (human immunodeficiency virus infection) (HCC)   . Hypertension   . Lung mass   . Myocardial infarction (HCC)    in 2000  . Type 2 diabetes mellitus (HCC)      Assessment: 60 yo male with K <2.0 despite supplementation. Hypo Mag and HypoCa on admission as well.   K <2.0 at 1600 (before 2nd set of KCl runs were started)  Goal of Therapy:  K 3.5-5.0 Mag ~2   Plan:  Discussed with hospitalist - will change KCl PO order to 40 mEq TID and recheck K after the current 4 KCl runs that are running and another PO dose.  Recheck K at 2200 Plan discussed with RN.   Marty HeckWang, Chrissi Crow L 05/06/2016,5:25 PM

## 2016-05-06 NOTE — Progress Notes (Signed)
Notified MD Pyreddy of critical potassium, calcium, and magnesium levels. Per MD give 1g calcium chloride IV, 2g magnesium sulfate IV, 10 mEq potassium chloride x4 IV, and start NS w/ 20 mEq K+ at 75 mL/hr after replacements given. Potassium level to be rechecked at 1100. Nursing staff will continue to monitor for any changes in patient status. Lamonte RicherKara A Brindle Leyba, RN

## 2016-05-06 NOTE — Progress Notes (Signed)
[facility]  ROUNDING NOTE   Subjective:  Patient well known to [place] from the office. We follow him for chronic kidney disease stage III though we've not seen him since 2016. He presents now with profound hypokalemia and hypomagnesemia. Patient reports that he was recently started on diuretic therapy. He does not know the name of all of his medications but knows that one of the diuretics was metolazone. He is received a significant amount of potassium chloride however serum potassium remains quite low.   Objective:  Vital signs in last 24 hours:  Temp:  [97.8 F (36.6 C)-98.2 F (36.8 C)] 97.8 F (36.6 C) (09/28 1144) Pulse Rate:  [67-84] 68 (09/28 1144) Resp:  [18-29] 18 (09/28 1144) BP: (100-125)/(61-93) 112/64 (09/28 1144) SpO2:  [93 %-99 %] 95 % (09/28 1144) Weight:  [70.3 kg (155 lb)-75.1 kg (165 lb 9.6 oz)] 75.1 kg (165 lb 9.6 oz) (09/27 2226)  Weight change:  Filed Weights   05/05/16 1630 05/05/16 2226  Weight: 70.3 kg (155 lb) 75.1 kg (165 lb 9.6 oz)    Intake/Output: I/O last 3 completed shifts: In: 1039.6 [I.V.:1039.6] Out: 250 [Urine:250]   Intake/Output this shift:  Total I/O In: 1043.8 [P.O.:480; I.V.:363.8; IV Piggyback:200] Out: 250 [Urine:250]  Physical Exam: General: No acute distress  Head: Normocephalic, atraumatic. Moist oral mucosal membranes  Eyes: Anicteric  Neck: Supple, trachea midline  Lungs:  Clear to auscultation, normal effort  Heart: S1S2 no rubs  Abdomen:  Soft, nontender, bowel sounds present  Extremities: 1+ peripheral edema.  Neurologic: Nonfocal, moving all four extremities  Skin: No lesions       Basic Metabolic Panel:  Recent Labs Lab 05/05/16 1632 05/05/16 2004 05/06/16 0410 05/06/16 1059  NA 146* 146* 148*  --   K <2.0* <2.0* <2.0* <2.0*  CL 101 102 105  --   CO2 33* 34* 38*  --   GLUCOSE 115* 125* 90  --   BUN 13 14 13   --   CREATININE 1.60* 1.41* 1.31*  --   CALCIUM 7.0* 6.6* 6.4*  --   MG 0.5*   --  1.1* 1.8    Liver Function Tests:  Recent Labs Lab 05/05/16 1632  AST 41  ALT 18  ALKPHOS 62  BILITOT 0.4  PROT 7.0  ALBUMIN 3.1*   No results for input(s): LIPASE, AMYLASE in the last 168 hours. No results for input(s): AMMONIA in the last 168 hours.  CBC:  Recent Labs Lab 05/05/16 1632 05/06/16 0410  WBC 8.6 6.1  HGB 13.0 11.0*  HCT 39.2* 32.4*  MCV 97.7 97.6  PLT 265 215    Cardiac Enzymes:  Recent Labs Lab 05/05/16 1632 05/06/16 0410  CKTOTAL 2,066* 1,932*  TROPONINI 0.06*  --     BNP: Invalid input(s): POCBNP  CBG: No results for input(s): GLUCAP in the last 168 hours.  Microbiology: Results for orders placed or performed during the hospital encounter of 03/15/16  MRSA PCR Screening     Status: None   Collection Time: 03/15/16  8:11 PM  Result Value Ref Range Status   MRSA by PCR NEGATIVE NEGATIVE Final    Comment:        The GeneXpert MRSA Assay (FDA approved for NASAL specimens only), is one component of a comprehensive MRSA colonization surveillance program. It is not intended to diagnose MRSA infection nor to guide or monitor treatment for MRSA infections.     Coagulation Studies: No results for input(s): LABPROT, INR in the  last 72 hours.  Urinalysis:  Recent Labs  05/05/16 1631  COLORURINE YELLOW*  LABSPEC 1.012  PHURINE 5.0  GLUCOSEU NEGATIVE  HGBUR 2+*  BILIRUBINUR NEGATIVE  KETONESUR NEGATIVE  PROTEINUR 30*  NITRITE NEGATIVE  LEUKOCYTESUR NEGATIVE      Imaging: Dg Chest 2 View  Result Date: 05/05/2016 CLINICAL DATA:  60 year old male with history of weakness and tingling all over. EXAM: CHEST  2 VIEW COMPARISON:  Chest x-ray 06/24/2015. FINDINGS: Lung volumes are low. No consolidative airspace disease. Mild blunting of both costophrenic sulci noted on the lateral projection, which appears to be chronic, likely related to mild pleuroparenchymal scarring in the lung bases. No pneumothorax. No pulmonary nodule or  mass noted. Pulmonary vasculature and the cardiomediastinal silhouette are within normal limits. IVC filter noted on the lateral projection. IMPRESSION: 1. Low lung volumes without radiographic evidence of acute cardiopulmonary disease. Electronically Signed   By: Trudie Reedaniel  Entrikin M.D.   On: 05/05/2016 19:05     Medications:   . 0.9 % NaCl with KCl 20 mEq / L 75 mL/hr at 05/06/16 1009   . abacavir-dolutegravir-lamiVUDine  1 tablet Oral Daily  . calcium-vitamin D  1 tablet Oral BID  . carvedilol  12.5 mg Oral BID WC  . enoxaparin  1 mg/kg Subcutaneous Q12H  . fluconazole  200 mg Oral Daily  . fluticasone  1 spray Each Nare Daily  . gemfibrozil  600 mg Oral BID AC  . linaclotide  290 mcg Oral QAC breakfast  . loratadine  10 mg Oral Daily  . methimazole  10 mg Oral Daily  . mirtazapine  30 mg Oral QHS  . mometasone-formoterol  2 puff Inhalation BID  . multivitamin with minerals  1 tablet Oral Daily  . pantoprazole  40 mg Oral Daily  . potassium chloride  40 mEq Oral BID  . potassium chloride  10 mEq Intravenous Q1 Hr x 4  . sodium chloride flush  3 mL Intravenous Q12H  . tiotropium  18 mcg Inhalation Daily   albuterol, clonazePAM, nitroGLYCERIN, traMADol  Assessment/ Plan:  60 y.o. male with past medical history of coronary disease, hypertension, COPD, GERD, hyperlipidemia, sleep apnea, depression, HIV, DM  1.  CKD stage III 2.  Hypernatremia. 3.  Severe hypokalemia and hypomagnesemia due to diuretic usage, patient was on 2 diuretics, one of which included metolazone.  4.  Metabolic Alkaosis.    Plan:  Patient presents with profound hypokalemia and hypomagnesemia. This is likely diuretic induced. The patient was on 2 diuretics one of which included metolazone. Agree with discontinuation of diuretic therapy. Continue potassium repletion. Magnesium has been corrected. Patient will likely require significant amounts of potassium chloride to correct the issue. Serum sodium also noted  to be high at 148.  We will also fluids to half-normal saline with 40 mEq of potassium chloride.   LOS: 1 Brain Honeycutt 9/28/20173:47 PM

## 2016-05-06 NOTE — Progress Notes (Signed)
PT Cancellation Note  Patient Details Name: Brian Hampton MRN: [id number] DOB: 29-Mar-1956   Cancelled Treatment:    Reason Eval/Treat Not Completed: Patient not medically ready. Consult received and chart reviewed. Pt with critical potassium level, <2.0. Pt is contraindicated for PT assessment at this time. Will re-attempt next date, if medically stable.   Tamika Nou 05/06/2016, 1:19 PM Elizabeth PalauStephanie Yukari Flax, PT, DPT [phone]

## 2016-05-06 NOTE — Progress Notes (Signed)
Pharmacy Note  Pt on enoxaparin 1 mg/kg q12h - currently 70 mg q12h Wt recorded as 75.1 kg Will adjust dose to 75 mg/kg q12h

## 2016-05-06 NOTE — Care Management (Signed)
Patient presents from home  with weakness to the point that he fell at home and could not get up. Found to have hypokalemia and hypomagnesia- thought to be due to diuretics.  He is on chronic lovenox injections for DVT.  He failed oral anticoagulants.  PT consult is pending.  CM made attempt to speak with patient and he was sleeping and does not awaken  to knock at the door.

## 2016-05-06 NOTE — Progress Notes (Signed)
Konidena notified that patient's potassium is still less 2.0.

## 2016-05-06 NOTE — Progress Notes (Signed)
Konidena notified of critical potassium of less than 2.0.  No new orders.  Nephro consult pending.

## 2016-05-06 NOTE — Progress Notes (Signed)
[facility] [place] [facility]   PATIENT NAME: Brian Hampton    MR#:  [id number]  DATE OF BIRTH:  Feb 02, 1956  SUBJECTIVE:admitted for fall/rhabdomyolysis/ Weakness/found to have severe hypokalemia/pt had severe hypokalemia,hypomagnsemia,K still low despite aggressive replacement.pt still feels very weak.very obese,  CHIEF COMPLAINT:   Chief Complaint  Patient presents with  . Weakness    REVIEW OF SYSTEMS:   ROS CONSTITUTIONAL: No fever, fatigue or weakness.  EYES: No blurred or double vision.  EARS, NOSE, AND THROAT: No tinnitus or ear pain.  RESPIRATORY: No cough, shortness of breath, wheezing or hemoptysis.  CARDIOVASCULAR: No chest pain, orthopnea, edema.  GASTROINTESTINAL: No nausea, vomiting, diarrhea or abdominal pain.  GENITOURINARY: No dysuria, hematuria.  ENDOCRINE: No polyuria, nocturia,  HEMATOLOGY: No anemia, easy bruising or bleeding SKIN: No rash or lesion. MUSCULOSKELETAL: No joint pain or arthritis.   NEUROLOGIC: No tingling, numbness, weakness.  PSYCHIATRY: No anxiety or depression.   DRUG ALLERGIES:   Allergies  Allergen Reactions  . Aspirin Anaphylaxis  . Bee Venom Anaphylaxis  . Penicillins Anaphylaxis and Other (See Comments)    Has patient had a PCN reaction causing immediate rash, facial/tongue/throat swelling, SOB or lightheadedness with hypotension: Yes Has patient had a PCN reaction causing severe rash involving mucus membranes or skin necrosis: No Has patient had a PCN reaction that required hospitalization No Has patient had a PCN reaction occurring within the last 10 years: Yes If all of the above answers are "NO", then may proceed with Cephalosporin use.  . Prednisone Anaphylaxis  . Sulfa Antibiotics Anaphylaxis  . Sulfasalazine Anaphylaxis  . Theophyllines Anaphylaxis  . Theophylline Swelling    VITALS:  Blood pressure 112/64, pulse 68, temperature 97.8 F (36.6 C), temperature source Oral, resp. rate 18,  height 5\' 5"  (1.651 m), weight 75.1 kg (165 lb 9.6 oz), SpO2 95 %.  PHYSICAL EXAMINATION:  GENERAL:  60 y.o.-year-old patient lying in the bed with no acute distress.  EYES: Pupils equal, round, reactive to light and accommodation. No scleral icterus. Extraocular muscles intact.  HEENT: Head atraumatic, normocephalic. Oropharynx and nasopharynx clear.  NECK:  Supple, no jugular venous distention. No thyroid enlargement, no tenderness.  LUNGS: Normal breath sounds bilaterally, no wheezing, rales,rhonchi or crepitation. No use of accessory muscles of respiration.  CARDIOVASCULAR: S1, S2 normal. No murmurs, rubs, or gallops.  ABDOMEN: Soft, nontender, nondistended. Bowel sounds present. No organomegaly or mass.  EXTREMITIES: No pedal edema, cyanosis, or clubbing.  NEUROLOGIC: Cranial nerves II through XII are intact. Muscle strength 5/5 in all extremities. Sensation intact. Gait not checked.  PSYCHIATRIC: The patient is alert and oriented x 3.  SKIN: No obvious rash, lesion, or ulcer.    LABORATORY PANEL:   CBC  Recent Labs Lab 05/06/16 0410  WBC 6.1  HGB 11.0*  HCT 32.4*  PLT 215   ------------------------------------------------------------------------------------------------------------------  Chemistries   Recent Labs Lab 05/05/16 1632  05/06/16 0410 05/06/16 1059 05/06/16 1601  NA 146*  < > 148*  --   --   K <2.0*  < > <2.0* <2.0* <2.0*  CL 101  < > 105  --   --   CO2 33*  < > 38*  --   --   GLUCOSE 115*  < > 90  --   --   BUN 13  < > 13  --   --   CREATININE 1.60*  < > 1.31*  --   --   CALCIUM 7.0*  < >  6.4*  --   --   MG 0.5*  --  1.1* 1.8  --   AST 41  --   --   --   --   ALT 18  --   --   --   --   ALKPHOS 62  --   --   --   --   BILITOT 0.4  --   --   --   --   < > = values in this interval not displayed. ------------------------------------------------------------------------------------------------------------------  Cardiac Enzymes  Recent Labs Lab  05/05/16 1632  TROPONINI 0.06*   ------------------------------------------------------------------------------------------------------------------  RADIOLOGY:  Dg Chest 2 View  Result Date: 05/05/2016 CLINICAL DATA:  60 year old male with history of weakness and tingling all over. EXAM: CHEST  2 VIEW COMPARISON:  Chest x-ray 06/24/2015. FINDINGS: Lung volumes are low. No consolidative airspace disease. Mild blunting of both costophrenic sulci noted on the lateral projection, which appears to be chronic, likely related to mild pleuroparenchymal scarring in the lung bases. No pneumothorax. No pulmonary nodule or mass noted. Pulmonary vasculature and the cardiomediastinal silhouette are within normal limits. IVC filter noted on the lateral projection. IMPRESSION: 1. Low lung volumes without radiographic evidence of acute cardiopulmonary disease. Electronically Signed   By: Trudie Reed M.D.   On: 05/05/2016 19:05    EKG:   Orders placed or performed during the hospital encounter of 05/05/16  . ED EKG  . ED EKG    ASSESSMENT AND PLAN:   1.s/p fall with rhabdomyolysis;improving with IV hydration 2.severe hypokalemia/hypomagnesemia;/due to diuretic use;which including metozalone;stopped diuretics;continue aggressive replacement/ of k,mg,calcium Appreciate nephrology input. 3.h/o HIV ;on antivirals;d/w ID,no interaction with meds .meds not causing hypokalemia/other electrolyte disturbance  4.acute renal failure omn CKD stage 3;improving 5.deconditioning/fall;PT consult  D/w nurse,pt.   All the records are reviewed and case discussed with Care Management/Social Workerr. Management plans discussed with the patient, family and they are in agreement.  CODE STATUS:DNR  TOTAL TIME TAKING CARE OF THIS PATIENT: 35 minutes.   POSSIBLE D/C IN 1-3DAYS, DEPENDING ON CLINICAL CONDITION.   Katha Hamming M.D on 05/06/2016 at 4:54 PM  Between 7am to 6pm - Pager - [phone]  After  6pm go to [email or web address] - password [email or web address]  [facility]  Office  [phone]  CC: Primary care physician; [facility]   Note: This dictation was prepared with Dragon dictation along with smaller phrase technology. Any transcriptional errors that result from this process are unintentional.

## 2016-05-06 NOTE — Consult Note (Signed)
   [facility]   05/06/2016  Robina Adeommy L Krauser [birth year] [id number]   Patient screened for potential [facility] Care Management services. Patient is eligible for [facility] Services. Spoke with inpatient case manager caring for patient, she  revealed patient's discharge plan is potentially to go home to live with an aunt and she assessed there were  identifiable [facility] care [facility] needs. Inpatient care manager requested liaison not talk with patient at his present time related to discharge disposition not completely confirmed and she needed to discuss further details with aunt and patient. Inpatient care manager made a note of patient's eligibility and stated she would place a referral as the the plans finalized if [facility] Care [facility] services are appropriate. For questions please contact:   Kalven Ganim RN, BSN [facility]  Hospital Liaison  [phone]) Business Mobile [phone]) Toll free office

## 2016-05-07 LAB — BASIC METABOLIC PANEL
ANION GAP: 7 (ref 5–15)
BUN: 8 mg/dL (ref 6–20)
CHLORIDE: 105 mmol/L (ref 101–111)
CO2: 34 mmol/L — ABNORMAL HIGH (ref 22–32)
Calcium: 7.5 mg/dL — ABNORMAL LOW (ref 8.9–10.3)
Creatinine, Ser: 1.11 mg/dL (ref 0.61–1.24)
Glucose, Bld: 123 mg/dL — ABNORMAL HIGH (ref 65–99)
POTASSIUM: 2.7 mmol/L — AB (ref 3.5–5.1)
SODIUM: 146 mmol/L — AB (ref 135–145)

## 2016-05-07 LAB — POTASSIUM
Potassium: 2.4 mmol/L — CL (ref 3.5–5.1)
Potassium: 2.8 mmol/L — ABNORMAL LOW (ref 3.5–5.1)
Potassium: 4.2 mmol/L (ref 3.5–5.1)

## 2016-05-07 LAB — MAGNESIUM: Magnesium: 2.3 mg/dL (ref 1.7–2.4)

## 2016-05-07 LAB — PHOSPHORUS: Phosphorus: 2.9 mg/dL (ref 2.5–4.6)

## 2016-05-07 MED ORDER — SODIUM CHLORIDE 0.9 % IV SOLN
1.0000 g | Freq: Once | INTRAVENOUS | Status: AC
  Administered 2016-05-07: 1 g via INTRAVENOUS
  Filled 2016-05-07: qty 10

## 2016-05-07 MED ORDER — OXYCODONE-ACETAMINOPHEN 5-325 MG PO TABS
1.0000 | ORAL_TABLET | Freq: Four times a day (QID) | ORAL | Status: DC | PRN
  Administered 2016-05-07 – 2016-05-11 (×3): 1 via ORAL
  Filled 2016-05-07 (×3): qty 1

## 2016-05-07 MED ORDER — POTASSIUM CHLORIDE 2 MEQ/ML IV SOLN
Freq: Once | INTRAVENOUS | Status: AC
  Administered 2016-05-07: 08:00:00 via INTRAVENOUS
  Filled 2016-05-07: qty 500

## 2016-05-07 MED ORDER — POTASSIUM CHLORIDE 10 MEQ/100ML IV SOLN
10.0000 meq | INTRAVENOUS | Status: DC
  Filled 2016-05-07 (×4): qty 100

## 2016-05-07 NOTE — Plan of Care (Signed)
Problem: Pain Managment: Goal: General experience of comfort will improve Outcome: Not Progressing No relief of pain from po pain meds today  Problem: Bowel/Gastric: Goal: Will not experience complications related to bowel motility Outcome: Progressing Only one episode of diarrhea today

## 2016-05-07 NOTE — Care Management (Addendum)
Spoke with Brian Hampton patient's Brian Hampton.  Says she has HCPOA for patient but he makes his own decisions.  There is no legal guardian.  Patient lives on the second floor of a boarding house.  Brian Hampton says that patient should not go to live with his aunt because she is old and can not help him.  Brian Hampton says that patient's landlord is looking for a room on the ground level.  Says patient needs a handicapped apartment at discharge.  Discussed if patient is able to discharge directly home with resumption of home health, would add social work to assist with housing options.  Stressed that handicapped housing would not be found prior to discharge.  Physical therapy consult is pending.  Discussed it is very possible there may be a need for short term skilled nursing due to weakness that patient verbalizes.  [facility].  Spoke with Hampton LandAngela with [facility].  Requested SN PT OT aide and SW.  Found that patient had been followed by [facility] during recent stay at  [facility].  Agency has current order to also follow patient at home.

## 2016-05-07 NOTE — Progress Notes (Addendum)
[facility] [place] [facility]   PATIENT NAME: Brian Hampton    MR#:  [id number]  DATE OF BIRTH:  04/05/1956  SUBJECTIVE:admitted for fall/rhabdomyolysis/ Weakness/found to have severe hypokalemia/pt had severe hypokalemia,hypomagnsemia,potassium is better today.pt  Feels very weak.had lot of watery diarrhoea yesterday and today,stool cdiff has been  Negative.gave immodium and stopped LInzess.  CHIEF COMPLAINT:   Chief Complaint  Patient presents with  . Weakness    REVIEW OF SYSTEMS:   ROS CONSTITUTIONAL: No fever, fatigue or weakness.  EYES: No blurred or double vision.  EARS, NOSE, AND THROAT: No tinnitus or ear pain.  RESPIRATORY: No cough, shortness of breath, wheezing or hemoptysis.  CARDIOVASCULAR: No chest pain, orthopnea, edema.  GASTROINTESTINAL: No nausea, vomiting, diarrhea or abdominal pain.  GENITOURINARY: No dysuria, hematuria.  ENDOCRINE: No polyuria, nocturia,  HEMATOLOGY: No anemia, easy bruising or bleeding SKIN: No rash or lesion. MUSCULOSKELETAL: No joint pain or arthritis.   NEUROLOGIC: No tingling, numbness, weakness.  PSYCHIATRY: No anxiety or depression.   DRUG ALLERGIES:   Allergies  Allergen Reactions  . Aspirin Anaphylaxis  . Bee Venom Anaphylaxis  . Penicillins Anaphylaxis and Other (See Comments)    Has patient had a PCN reaction causing immediate rash, facial/tongue/throat swelling, SOB or lightheadedness with hypotension: Yes Has patient had a PCN reaction causing severe rash involving mucus membranes or skin necrosis: No Has patient had a PCN reaction that required hospitalization No Has patient had a PCN reaction occurring within the last 10 years: Yes If all of the above answers are "NO", then may proceed with Cephalosporin use.  . Prednisone Anaphylaxis  . Sulfa Antibiotics Anaphylaxis  . Sulfasalazine Anaphylaxis  . Theophyllines Anaphylaxis  . Theophylline Swelling    VITALS:  Blood pressure 111/74,  pulse 67, temperature 97.8 F (36.6 C), resp. rate 18, height 5\' 5"  (1.651 m), weight 75.1 kg (165 lb 9.6 oz), SpO2 98 %.  PHYSICAL EXAMINATION:  GENERAL:  60 y.o.-year-old patient lying in the bed with no acute distress.  EYES: Pupils equal, round, reactive to light and accommodation. No scleral icterus. Extraocular muscles intact.  HEENT: Head atraumatic, normocephalic. Oropharynx and nasopharynx clear.  NECK:  Supple, no jugular venous distention. No thyroid enlargement, no tenderness.  LUNGS: wheezing on right base.no rales,rhonchi or crepitation. No use of accessory muscles of respiration.  CARDIOVASCULAR: S1, S2 normal. No murmurs, rubs, or gallops.  ABDOMEN: Soft, nontender, nondistended. Bowel sounds present. No organomegaly or mass.  EXTREMITIES: No pedal edema, cyanosis, or clubbing.  NEUROLOGIC: Cranial nerves II through XII are intact. Muscle strength 5/5 in all extremities. Sensation intact. Gait not checked.  PSYCHIATRIC: The patient is alert and oriented x 3.  SKIN: No obvious rash, lesion, or ulcer.    LABORATORY PANEL:   CBC  Recent Labs Lab 05/06/16 0410  WBC 6.1  HGB 11.0*  HCT 32.4*  PLT 215   ------------------------------------------------------------------------------------------------------------------  Chemistries   Recent Labs Lab 05/05/16 1632  05/07/16 0437 05/07/16 0756  NA 146*  < > 146*  --   K <2.0*  < > 2.7* 2.8*  CL 101  < > 105  --   CO2 33*  < > 34*  --   GLUCOSE 115*  < > 123*  --   BUN 13  < > 8  --   CREATININE 1.60*  < > 1.11  --   CALCIUM 7.0*  < > 7.5*  --   MG 0.5*  < > 2.3  --  AST 41  --   --   --   ALT 18  --   --   --   ALKPHOS 62  --   --   --   BILITOT 0.4  --   --   --   < > = values in this interval not displayed. ------------------------------------------------------------------------------------------------------------------  Cardiac Enzymes  Recent Labs Lab 05/05/16 1632  TROPONINI 0.06*    ------------------------------------------------------------------------------------------------------------------  RADIOLOGY:  Dg Chest 2 View  Result Date: 05/05/2016 CLINICAL DATA:  60 year old male with history of weakness and tingling all over. EXAM: CHEST  2 VIEW COMPARISON:  Chest x-ray 06/24/2015. FINDINGS: Lung volumes are low. No consolidative airspace disease. Mild blunting of both costophrenic sulci noted on the lateral projection, which appears to be chronic, likely related to mild pleuroparenchymal scarring in the lung bases. No pneumothorax. No pulmonary nodule or mass noted. Pulmonary vasculature and the cardiomediastinal silhouette are within normal limits. IVC filter noted on the lateral projection. IMPRESSION: 1. Low lung volumes without radiographic evidence of acute cardiopulmonary disease. Electronically Signed   By: Trudie Reed M.D.   On: 05/05/2016 19:05    EKG:   Orders placed or performed during the hospital encounter of 05/05/16  . ED EKG  . ED EKG    ASSESSMENT AND PLAN:   1.s/p fall with rhabdomyolysis;improving with IV hydration 2.severe hypokalemia/hypomagnesemia;/due to diuretic use;which including metozalone;stopped diuretics;continue aggressive replacement/ of k,mg,calcium,k is improving slowly. Appreciate nephrology input. 3.h/o HIV ;on antivirals;d/w ID,no interaction with meds .meds not causing hypokalemia/other electrolyte disturbance  4.acute renal failure omn CKD stage 3;improving 5.deconditioning/fall;PT consult 6.Marland Kitchenacute diarrhea;resolved.stoll cdiff is negative,check stool cultures.given imodium D/w nurse,pt.   All the records are reviewed and case discussed with Care Management/Social Workerr. Management plans discussed with the patient, family and they are in agreement.  CODE STATUS:DNR  TOTAL TIME TAKING CARE OF THIS PATIENT: 35 minutes.   POSSIBLE D/C IN 1-3DAYS, DEPENDING ON CLINICAL CONDITION.   Katha Hamming M.D on  05/07/2016 at 3:31 PM  Between 7am to 6pm - Pager - [phone]  After 6pm go to [email or web address] - password [email or web address]  [facility]  Office  [phone]  CC: Primary care physician; [facility]   Note: This dictation was prepared with Dragon dictation along with smaller phrase technology. Any transcriptional errors that result from this process are unintentional.

## 2016-05-07 NOTE — Progress Notes (Signed)
PT Cancellation Note  Patient Details Name: Brian Hampton MRN: [id number] DOB: 01-20-1956   Cancelled Treatment:     Pt's potassium currently trending up but remains low at 2.8.  Spoke to Dr. Luberta MutterKonidena who requests PT to hold off on seeing pt until 05/08/16 if appropriate.     [name] 05/07/2016, 3:17 PM

## 2016-05-07 NOTE — Progress Notes (Signed)
[facility]  ROUNDING NOTE   Subjective:  Patient still reports feeling weak. Potassium is up to 2.8 after multiple rounds of potassium chloride. Serum sodium currently 146. Diuretics have been held.   Objective:  Vital signs in last 24 hours:  Temp:  [97.8 F (36.6 C)-98.3 F (36.8 C)] 97.8 F (36.6 C) (09/29 1158) Pulse Rate:  [63-78] 67 (09/29 1158) Resp:  [14-18] 18 (09/29 1158) BP: (93-117)/(50-74) 111/74 (09/29 1158) SpO2:  [96 %-99 %] 98 % (09/29 1158)  Weight change:  Filed Weights   05/05/16 1630 05/05/16 2226  Weight: 70.3 kg (155 lb) 75.1 kg (165 lb 9.6 oz)    Intake/Output: I/O last 3 completed shifts: In: 2799.6 [P.O.:480; I.V.:1569.6; IV Piggyback:750] Out: 1100 [Urine:1100]   Intake/Output this shift:  Total I/O In: -  Out: 575 [Urine:575]  Physical Exam: General: No acute distress  Head: Normocephalic, atraumatic. Moist oral mucosal membranes  Eyes: Anicteric  Neck: Supple, trachea midline  Lungs:  Clear to auscultation, normal effort  Heart: S1S2 no rubs  Abdomen:  Soft, nontender, bowel sounds present  Extremities: 1+ peripheral edema.  Neurologic: Nonfocal, moving all four extremities  Skin: No lesions       Basic Metabolic Panel:  Recent Labs Lab 05/05/16 1632 05/05/16 2004 05/06/16 0410 05/06/16 1059 05/06/16 1601 05/06/16 2202 05/06/16 2353 05/07/16 0437 05/07/16 0756  NA 146* 146* 148*  --   --   --   --  146*  --   K <2.0* <2.0* <2.0* <2.0* <2.0* 2.3* 2.4* 2.7* 2.8*  CL 101 102 105  --   --   --   --  105  --   CO2 33* 34* 38*  --   --   --   --  34*  --   GLUCOSE 115* 125* 90  --   --   --   --  123*  --   BUN 13 14 13   --   --   --   --  8  --   CREATININE 1.60* 1.41* 1.31*  --   --   --   --  1.11  --   CALCIUM 7.0* 6.6* 6.4*  --   --   --   --  7.5*  --   MG 0.5*  --  1.1* 1.8  --   --   --  2.3  --   PHOS  --   --   --   --   --   --   --  2.9  --     Liver Function Tests:  Recent Labs Lab  05/05/16 1632  AST 41  ALT 18  ALKPHOS 62  BILITOT 0.4  PROT 7.0  ALBUMIN 3.1*   No results for input(s): LIPASE, AMYLASE in the last 168 hours. No results for input(s): AMMONIA in the last 168 hours.  CBC:  Recent Labs Lab 05/05/16 1632 05/06/16 0410  WBC 8.6 6.1  HGB 13.0 11.0*  HCT 39.2* 32.4*  MCV 97.7 97.6  PLT 265 215    Cardiac Enzymes:  Recent Labs Lab 05/05/16 1632 05/06/16 0410  CKTOTAL 2,066* 1,932*  TROPONINI 0.06*  --     BNP: Invalid input(s): POCBNP  CBG: No results for input(s): GLUCAP in the last 168 hours.  Microbiology: Results for orders placed or performed during the hospital encounter of 05/05/16  C difficile quick scan w PCR reflex     Status: None   Collection Time: 05/06/16  8:55 PM  Result Value Ref Range Status   C Diff antigen NEGATIVE NEGATIVE Final   C Diff toxin NEGATIVE NEGATIVE Final   C Diff interpretation No C. difficile detected.  Final    Coagulation Studies: No results for input(s): LABPROT, INR in the last 72 hours.  Urinalysis:  Recent Labs  05/05/16 1631  COLORURINE YELLOW*  LABSPEC 1.012  PHURINE 5.0  GLUCOSEU NEGATIVE  HGBUR 2+*  BILIRUBINUR NEGATIVE  KETONESUR NEGATIVE  PROTEINUR 30*  NITRITE NEGATIVE  LEUKOCYTESUR NEGATIVE      Imaging: Dg Chest 2 View  Result Date: 05/05/2016 CLINICAL DATA:  60 year old male with history of weakness and tingling all over. EXAM: CHEST  2 VIEW COMPARISON:  Chest x-ray 06/24/2015. FINDINGS: Lung volumes are low. No consolidative airspace disease. Mild blunting of both costophrenic sulci noted on the lateral projection, which appears to be chronic, likely related to mild pleuroparenchymal scarring in the lung bases. No pneumothorax. No pulmonary nodule or mass noted. Pulmonary vasculature and the cardiomediastinal silhouette are within normal limits. IVC filter noted on the lateral projection. IMPRESSION: 1. Low lung volumes without radiographic evidence of acute  cardiopulmonary disease. Electronically Signed   By: Trudie Reed M.D.   On: 05/05/2016 19:05     Medications:   . sodium chloride 0.45 % with kcl 75 mL/hr at 05/06/16 1951   . abacavir-dolutegravir-lamiVUDine  1 tablet Oral Daily  . calcium-vitamin D  1 tablet Oral BID  . enoxaparin  1 mg/kg Subcutaneous Q12H  . fluconazole  200 mg Oral Daily  . fluticasone  1 spray Each Nare Daily  . linaclotide  290 mcg Oral QAC breakfast  . loratadine  10 mg Oral Daily  . methimazole  10 mg Oral Daily  . mirtazapine  15 mg Oral QHS  . mometasone-formoterol  2 puff Inhalation BID  . multivitamin with minerals  1 tablet Oral Daily  . pantoprazole  40 mg Oral Daily  . potassium chloride  40 mEq Oral TID  . sodium chloride flush  3 mL Intravenous Q12H  . tiotropium  18 mcg Inhalation Daily   acetaminophen, albuterol, clonazePAM, loperamide, nitroGLYCERIN, traMADol  Assessment/ Plan:  60 y.o. male with past medical history of coronary disease, hypertension, COPD, GERD, hyperlipidemia, sleep apnea, depression, HIV, DM  1.  CKD stage III 2.  Hypernatremia. 3.  Severe hypokalemia and hypomagnesemia due to diuretic usage, patient was on 2 diuretics, one of which included metolazone.  4.  Metabolic Alkaosis.    Plan:  Serum potassium is improving slowly.  Potassium currently is 2.8.  The patient has received multiple rounds of potassium infusion.  We will continuehalf-normal saline supplemented with potassium chloride.  In addition the patient will continue on by mouthpotassium.  Continue to recheck serum potassium closely as well.   LOS: 2 Brian Hampton 9/29/201712:48 PM

## 2016-05-07 NOTE — Care Management Important Message (Signed)
Important Message  Patient Details  Name: Brian Hampton MRN: [id number] Date of Birth: Aug 21, 1955   Medicare Important Message Given:  Yes    Eber HongGreene, Marjorie Lussier R, RN 05/07/2016, 11:47 AM

## 2016-05-08 LAB — BASIC METABOLIC PANEL
ANION GAP: 3 — AB (ref 5–15)
Anion gap: 3 — ABNORMAL LOW (ref 5–15)
Anion gap: 5 (ref 5–15)
BUN: 7 mg/dL (ref 6–20)
BUN: 8 mg/dL (ref 6–20)
BUN: 8 mg/dL (ref 6–20)
CALCIUM: 8.3 mg/dL — AB (ref 8.9–10.3)
CHLORIDE: 107 mmol/L (ref 101–111)
CHLORIDE: 106 mmol/L (ref 101–111)
CO2: 31 mmol/L (ref 22–32)
CO2: 30 mmol/L (ref 22–32)
CO2: 29 mmol/L (ref 22–32)
CREATININE: 0.93 mg/dL (ref 0.61–1.24)
CREATININE: 1.03 mg/dL (ref 0.61–1.24)
CREATININE: 1.04 mg/dL (ref 0.61–1.24)
Calcium: 8.7 mg/dL — ABNORMAL LOW (ref 8.9–10.3)
Calcium: 9.1 mg/dL (ref 8.9–10.3)
Chloride: 108 mmol/L (ref 101–111)
GFR calc Af Amer: >60 mL/min (ref >60)
GFR calc non Af Amer: >60 mL/min (ref >60)
GFR calc non Af Amer: >60 mL/min (ref >60)
GFR calc non Af Amer: >60 mL/min (ref >60)
GLUCOSE: 104 mg/dL — AB (ref 65–99)
Glucose, Bld: 101 mg/dL — ABNORMAL HIGH (ref 65–99)
Glucose, Bld: 100 mg/dL — ABNORMAL HIGH (ref 65–99)
Potassium: 6 mmol/L — ABNORMAL HIGH (ref 3.5–5.1)
Potassium: 5.6 mmol/L — ABNORMAL HIGH (ref 3.5–5.1)
Potassium: 5.6 mmol/L — ABNORMAL HIGH (ref 3.5–5.1)
Sodium: 142 mmol/L (ref 135–145)
Sodium: 140 mmol/L (ref 135–145)
Sodium: 140 mmol/L (ref 135–145)

## 2016-05-08 LAB — MAGNESIUM: Magnesium: 2 mg/dL (ref 1.7–2.4)

## 2016-05-08 MED ORDER — SODIUM CHLORIDE 0.45 % IV SOLN
INTRAVENOUS | Status: DC
  Administered 2016-05-08 (×2): via INTRAVENOUS
  Filled 2016-05-08: qty 1000

## 2016-05-08 MED ORDER — SODIUM POLYSTYRENE SULFONATE 15 GM/60ML PO SUSP
30.0000 g | Freq: Once | ORAL | Status: AC
  Administered 2016-05-08: 30 g via ORAL
  Filled 2016-05-08: qty 120

## 2016-05-08 NOTE — Evaluation (Signed)
Physical Therapy Evaluation Patient Details Name: Brian Hampton MRN: [id number] DOB: 03-07-1956 Today's Date: 05/08/2016   History of Present Illness  Joshual Terrio is a 60yo white male who comes to [facility] after weakness/fall, found to have low K+, and low Mg upon arrival and rhabdmyolysis. PMH: asthma, CPOD, emphysema, HIV, HTN, DM, MI, CAD, and is s/p IVC placement. P comes from a boarding house room on second floor but now has plans to DC and live with sister in a handicapped accessible mobile home.  PLOF inclused limited community distances with a QC.   Clinical Impression  Pt demonstrating generalized weakness with difficulty in transfers, bed mobility, and gait, but reports that he 75% near his baseline. Pt also requires a higher level of assistive device, today using a RW v his typical QC. His HR remains controlled during gait trial, and speed remains slow <0.38m.s, indicative of high falls risk and likely difficulty with AMB >household distances safely.   Patient presenting with impairment of strength, balance, and activity tolerance, limiting ability to perform ADL and mobility tasks at  baseline level of function. Patient will benefit from skilled intervention to address the above impairments and limitations, in order to restore to prior level of function, improve patient safety upon discharge, and to decrease falls risk.       Follow Up Recommendations Other (comment);Home health PT ([facility], already receiving services. )    Equipment Recommendations  None recommended by PT    Recommendations for Other Services       Precautions / Restrictions Precautions Precautions: None Restrictions Weight Bearing Restrictions: No      Mobility  Bed Mobility Overal bed mobility: Modified Independent             General bed mobility comments: additional time/effort required, supine to sitting with HOB elevated.   Transfers Overall transfer level:  Modified independent Equipment used: Rolling walker (2 wheeled)             General transfer comment: slow, steady, and safe.   Ambulation/Gait Ambulation/Gait assistance: Supervision Ambulation Distance (Feet): 180 Feet Assistive device: Rolling walker (2 wheeled) Gait Pattern/deviations: WFL(Within Functional Limits);Step-to pattern Gait velocity: 0.61m/s, high falls risk, not suitable for household distances.  Gait velocity interpretation: <1.8 ft/sec, indicative of risk for recurrent falls General Gait Details: says he is still fairly weak, but near baseline.   Stairs            Wheelchair Mobility    Modified Rankin (Stroke Patients Only)       Balance Overall balance assessment: Modified Independent                                           Pertinent Vitals/Pain Pain Assessment: 0-10 Pain Score: 9  Pain Location: Bilat LE leg pain  Pain Descriptors / Indicators: Aching Pain Intervention(s): Limited activity within patient's tolerance;Monitored during session;Repositioned    Home Living Family/patient expects to be discharged to:: Private residence Living Arrangements: Other relatives   Type of Home: Mobile home Home Access: Ramped entrance     Home Layout: One level Home Equipment: Environmental consultant - 2 wheels;Walker - 4 wheels;Cane - quad      Prior Function Level of Independence: Independent         Comments: PLOF indep in ADL and limited community distances.      Hand  Dominance        Extremity/Trunk Assessment   Upper Extremity Assessment: Generalized weakness;Overall [facility] HospitalWFL for tasks assessed           Lower Extremity Assessment: Generalized weakness;Overall WFL for tasks assessed         Communication      Cognition Arousal/Alertness: Awake/alert Behavior During Therapy: WFL for tasks assessed/performed Overall Cognitive Status: Within Functional Limits for tasks assessed                      General  Comments      Exercises     Assessment/Plan    PT Assessment Patient needs continued PT services  PT Problem List Decreased strength;Decreased mobility;Decreased balance;Decreased activity tolerance          PT Treatment Interventions Gait training;Functional mobility training;Therapeutic activities;Therapeutic exercise;Patient/family education    PT Goals (Current goals can be found in the Care Plan section)  Acute Rehab PT Goals Patient Stated Goal: improve strength for functional mobility.  PT Goal Formulation: With patient Time For Goal Achievement: 05/22/16 Potential to Achieve Goals: Good    Frequency Min 2X/week   Barriers to discharge        Co-evaluation               End of Session Equipment Utilized During Treatment: Gait belt Activity Tolerance: Patient tolerated treatment well;Patient limited by fatigue Patient left: in chair;with call bell/phone within reach Nurse Communication: Mobility status         Time: [id number] PT Time Calculation (min) (ACUTE ONLY): 17 min   Charges:   PT Evaluation $PT Eval Moderate Complexity: 1 Procedure PT Treatments $Therapeutic Activity: 8-22 mins   PT G Codes:       9:18 AM, 05/08/16 Rosamaria LintsAllan C Dartanian Knaggs, PT, DPT Physical Therapist - [place] [phone](ASCOM)  [phone] (mobile)

## 2016-05-08 NOTE — Progress Notes (Signed)
K was 6 this morning kayexalate given and lasted  BMP was 5.6.  Pt up to chair twice for meals. Ambulated to BR needed assistance getting up from toilet. Tolerating meals

## 2016-05-08 NOTE — Progress Notes (Signed)
MEDICATION RELATED CONSULT NOTE - INITIAL   Pharmacy Consult for Electrolyte replacement Indication: Hypo K, Mag, Ca  Allergies  Allergen Reactions  . Aspirin Anaphylaxis  . Bee Venom Anaphylaxis  . Penicillins Anaphylaxis and Other (See Comments)    Has patient had a PCN reaction causing immediate rash, facial/tongue/throat swelling, SOB or lightheadedness with hypotension: Yes Has patient had a PCN reaction causing severe rash involving mucus membranes or skin necrosis: No Has patient had a PCN reaction that required hospitalization No Has patient had a PCN reaction occurring within the last 10 years: Yes If all of the above answers are "NO", then may proceed with Cephalosporin use.  . Prednisone Anaphylaxis  . Sulfa Antibiotics Anaphylaxis  . Sulfasalazine Anaphylaxis  . Theophyllines Anaphylaxis  . Theophylline Swelling    Patient Measurements: Height: 5\' 5"  (165.1 cm) Weight: 165 lb 9.6 oz (75.1 kg) IBW/kg (Calculated) : 61.5   Vital Signs: Temp: 98 F (36.7 C) (09/30 0444) Temp Source: Oral (09/30 0444) BP: 100/70 (09/30 0444) Pulse Rate: 64 (09/30 0444) Intake/Output from previous day: 09/29 0701 - 09/30 0700 In: 2335 [P.O.:960; I.V.:1375] Out: 975 [Urine:975] Intake/Output from this shift: No intake/output data recorded.  Labs:  Recent Labs  05/05/16 1632  05/06/16 0410 05/06/16 1059 05/07/16 0437 05/08/16 0507  WBC 8.6  --  6.1  --   --   --   HGB 13.0  --  11.0*  --   --   --   HCT 39.2*  --  32.4*  --   --   --   PLT 265  --  215  --   --   --   CREATININE 1.60*  < > 1.31*  --  1.11 0.93  MG 0.5*  --  1.1* 1.8 2.3 2.0  PHOS  --   --   --   --  2.9  --   ALBUMIN 3.1*  --   --   --   --   --   PROT 7.0  --   --   --   --   --   AST 41  --   --   --   --   --   ALT 18  --   --   --   --   --   ALKPHOS 62  --   --   --   --   --   BILITOT 0.4  --   --   --   --   --   BILIDIR 0.1  --   --   --   --   --   IBILI 0.3  --   --   --   --   --   < >  = values in this interval not displayed. Estimated Creatinine Clearance: 80.9 mL/min (by C-G formula based on SCr of 0.93 mg/dL).   Microbiology: Recent Results (from the past 720 hour(s))  C difficile quick scan w PCR reflex     Status: None   Collection Time: 05/06/16  8:55 PM  Result Value Ref Range Status   C Diff antigen NEGATIVE NEGATIVE Final   C Diff toxin NEGATIVE NEGATIVE Final   C Diff interpretation No C. difficile detected.  Final    Medical History: Past Medical History:  Diagnosis Date  . Anemia   . Asthma   . Collagen vascular disease (HCC)   . COPD (chronic obstructive pulmonary disease) (HCC)   . Coronary artery disease   .  DVT (deep venous thrombosis) (HCC)   . Emphysema/COPD (HCC)   . HIV (human immunodeficiency virus infection) (HCC)   . Hypertension   . Lung mass   . Myocardial infarction (HCC)    in 2000  . Type 2 diabetes mellitus (HCC)      Assessment: 60 yo male with K <2.0 despite supplementation. Hypo Mag and HypoCa on admission as well.   K <2.0 at 1600 (before 2nd set of KCl runs were started)  Goal of Therapy:  K 3.5-5.0 Mag ~2   Plan:  Discussed with hospitalist - will change KCl PO order to 40 mEq TID and recheck K after the current 4 KCl runs that are running and another PO dose.  Recheck K at 2200 Plan discussed with RN.   9/28 2202 K 2.3. Ordered potassium chloride 40 mEq in D5W 500 mL IV x 1 at 125 mL/hr (4 hr infusion). Will recheck all electrolytes with AM labs.  9/29 0437 K 2.7, Mg 2.3, phos 2.9, Ca 7.5, alb 3.1 on 9/27 (adjusted approximately 8.2)  1. Repeat potassium chloride 40 mEq in D5W 500 mL IV x 1 at 125 mL/hr (4 hr infusion). Will recheck K after infusion. Patient remains on 0.45% sodium chloride with 40 mEq KCl/liter at 75 mL/hr and potassium chloride 40 mEq po TID. 2. Calcium gluconate 1 gm IV x 1  9/30 0507 K 6, Mg 2, Ca 8.3, adjusted approx 9.  1. Discontinue oral potassium chloride 40 mEq PO TID and remove  KCl from IVF. Will recheck BMP in 6 hours.  Carola FrostNathan A Cedrick Partain, Pharm.D., BCPS Clinical Pharmacist 05/08/2016,7:08 AM

## 2016-05-08 NOTE — Progress Notes (Signed)
[facility]  ROUNDING NOTE   Subjective:  Serum potassium has been corrected. Potassium found to be slightly high now. Patient states that his strength has improved.    Objective:  Vital signs in last 24 hours:  Temp:  [97.9 F (36.6 C)-98.1 F (36.7 C)] 97.9 F (36.6 C) (09/30 1123) Pulse Rate:  [63-91] 63 (09/30 1123) Resp:  [15-16] 16 (09/30 1123) BP: (97-100)/(59-70) 98/59 (09/30 1123) SpO2:  [98 %-99 %] 99 % (09/30 1123)  Weight change:  Filed Weights   05/05/16 1630 05/05/16 2226  Weight: 70.3 kg (155 lb) 75.1 kg (165 lb 9.6 oz)    Intake/Output: I/O last 3 completed shifts: In: 2335 [P.O.:960; I.V.:1375] Out: 1375 [Urine:1375]   Intake/Output this shift:  Total I/O In: 0  Out: 570 [Urine:570]  Physical Exam: General: No acute distress  Head: Normocephalic, atraumatic. Moist oral mucosal membranes  Eyes: Anicteric  Neck: Supple, trachea midline  Lungs:  Clear to auscultation, normal effort  Heart: S1S2 no rubs  Abdomen:  Soft, nontender, bowel sounds present  Extremities: 1+ peripheral edema.  Neurologic: Nonfocal, moving all four extremities  Skin: No lesions       Basic Metabolic Panel:  Recent Labs Lab 05/05/16 1632 05/05/16 2004 05/06/16 0410 05/06/16 1059  05/07/16 0437 05/07/16 0756 05/07/16 1524 05/08/16 0507 05/08/16 1140  NA 146* 146* 148*  --   --  146*  --   --  142 140  K <2.0* <2.0* <2.0* <2.0*  < > 2.7* 2.8* 4.2 6.0* 5.6*  CL 101 102 105  --   --  105  --   --  108 107  CO2 33* 34* 38*  --   --  34*  --   --  31 30  GLUCOSE 115* 125* 90  --   --  123*  --   --  104* 101*  BUN 13 14 13   --   --  8  --   --  7 8  CREATININE 1.60* 1.41* 1.31*  --   --  1.11  --   --  0.93 1.03  CALCIUM 7.0* 6.6* 6.4*  --   --  7.5*  --   --  8.3* 8.7*  MG 0.5*  --  1.1* 1.8  --  2.3  --   --  2.0  --   PHOS  --   --   --   --   --  2.9  --   --   --   --   < > = values in this interval not displayed.  Liver Function  Tests:  Recent Labs Lab 05/05/16 1632  AST 41  ALT 18  ALKPHOS 62  BILITOT 0.4  PROT 7.0  ALBUMIN 3.1*   No results for input(s): LIPASE, AMYLASE in the last 168 hours. No results for input(s): AMMONIA in the last 168 hours.  CBC:  Recent Labs Lab 05/05/16 1632 05/06/16 0410  WBC 8.6 6.1  HGB 13.0 11.0*  HCT 39.2* 32.4*  MCV 97.7 97.6  PLT 265 215    Cardiac Enzymes:  Recent Labs Lab 05/05/16 1632 05/06/16 0410  CKTOTAL 2,066* 1,932*  TROPONINI 0.06*  --     BNP: Invalid input(s): POCBNP  CBG: No results for input(s): GLUCAP in the last 168 hours.  Microbiology: Results for orders placed or performed during the hospital encounter of 05/05/16  C difficile quick scan w PCR reflex     Status: None   Collection  Time: 05/06/16  8:55 PM  Result Value Ref Range Status   C Diff antigen NEGATIVE NEGATIVE Final   C Diff toxin NEGATIVE NEGATIVE Final   C Diff interpretation No C. difficile detected.  Final    Coagulation Studies: No results for input(s): LABPROT, INR in the last 72 hours.  Urinalysis:  Recent Labs  05/05/16 1631  COLORURINE YELLOW*  LABSPEC 1.012  PHURINE 5.0  GLUCOSEU NEGATIVE  HGBUR 2+*  BILIRUBINUR NEGATIVE  KETONESUR NEGATIVE  PROTEINUR 30*  NITRITE NEGATIVE  LEUKOCYTESUR NEGATIVE      Imaging: No results found.   Medications:     . abacavir-dolutegravir-lamiVUDine  1 tablet Oral Daily  . calcium-vitamin D  1 tablet Oral BID  . enoxaparin  1 mg/kg Subcutaneous Q12H  . fluconazole  200 mg Oral Daily  . fluticasone  1 spray Each Nare Daily  . linaclotide  290 mcg Oral QAC breakfast  . loratadine  10 mg Oral Daily  . methimazole  10 mg Oral Daily  . mirtazapine  15 mg Oral QHS  . mometasone-formoterol  2 puff Inhalation BID  . multivitamin with minerals  1 tablet Oral Daily  . pantoprazole  40 mg Oral Daily  . sodium chloride flush  3 mL Intravenous Q12H  . tiotropium  18 mcg Inhalation Daily   acetaminophen,  albuterol, clonazePAM, loperamide, nitroGLYCERIN, oxyCODONE-acetaminophen, traMADol  Assessment/ Plan:  60 y.o. male with past medical history of coronary disease, hypertension, COPD, GERD, hyperlipidemia, sleep apnea, depression, HIV, DM  1.  CKD stage III 2.  Hypernatremia. 3.  Severe hypokalemia and hypomagnesemia due to diuretic usage, patient was on 2 diuretics, one of which included metolazone.  4.  Metabolic Alkaosis.    Plan:  Serum potassium has improved significantly. In fact potassium now found to be a bit high. Discontinue any further potassium supplementation. Continue to monitor serum potassium periodically.  Alkalosis appears to have improved as well. We will go ahead and discontinue IV fluid hydration.  LOS: 3 Brian Hampton 9/30/20173:54 PM

## 2016-05-08 NOTE — Progress Notes (Addendum)
[facility] - [place] at [facility]   PATIENT NAME: Brian Hampton    MR#:  [id number]  DATE OF BIRTH:  01/06/1956  SUBJECTIVE: He says he feels better today. Sitting in chair, potassium is up at 6 today. Further diarrhea.   CHIEF COMPLAINT:   Chief Complaint  Patient presents with  . Weakness    REVIEW OF SYSTEMS:   ROS CONSTITUTIONAL: No fever, fatigue or weakness.  EYES: No blurred or double vision.  EARS, NOSE, AND THROAT: No tinnitus or ear pain.  RESPIRATORY: No cough, shortness of breath, wheezing or hemoptysis.  CARDIOVASCULAR: No chest pain, orthopnea, edema.  GASTROINTESTINAL: No nausea, vomiting, diarrhea or abdominal pain.  GENITOURINARY: No dysuria, hematuria.  ENDOCRINE: No polyuria, nocturia,  HEMATOLOGY: No anemia, easy bruising or bleeding SKIN: No rash or lesion. MUSCULOSKELETAL: No joint pain or arthritis.   NEUROLOGIC: No tingling, numbness, weakness.  PSYCHIATRY: No anxiety or depression.   DRUG ALLERGIES:   Allergies  Allergen Reactions  . Aspirin Anaphylaxis  . Bee Venom Anaphylaxis  . Penicillins Anaphylaxis and Other (See Comments)    Has patient had a PCN reaction causing immediate rash, facial/tongue/throat swelling, SOB or lightheadedness with hypotension: Yes Has patient had a PCN reaction causing severe rash involving mucus membranes or skin necrosis: No Has patient had a PCN reaction that required hospitalization No Has patient had a PCN reaction occurring within the last 10 years: Yes If all of the above answers are "NO", then may proceed with Cephalosporin use.  . Prednisone Anaphylaxis  . Sulfa Antibiotics Anaphylaxis  . Sulfasalazine Anaphylaxis  . Theophyllines Anaphylaxis  . Theophylline Swelling    VITALS:  Blood pressure (!) 98/59, pulse 63, temperature 97.9 F (36.6 C), temperature source Oral, resp. rate 16, height 5\' 5"  (1.651 m), weight 75.1 kg (165 lb 9.6 oz), SpO2 99 %.  PHYSICAL EXAMINATION:   GENERAL:  60 y.o.-year-old patient lying in the bed with no acute distress.  EYES: Pupils equal, round, reactive to light and accommodation. No scleral icterus. Extraocular muscles intact.  HEENT: Head atraumatic, normocephalic. Oropharynx and nasopharynx clear.  NECK:  Supple, no jugular venous distention. No thyroid enlargement, no tenderness.  LUNGS: wheezing on right base.no rales,rhonchi or crepitation. No use of accessory muscles of respiration.  CARDIOVASCULAR: S1, S2 normal. No murmurs, rubs, or gallops.  ABDOMEN: Soft, nontender, nondistended. Bowel sounds present. No organomegaly or mass.  EXTREMITIES: No pedal edema, cyanosis, or clubbing.  NEUROLOGIC: Cranial nerves II through XII are intact. Muscle strength 5/5 in all extremities. Sensation intact. Gait not checked.  PSYCHIATRIC: The patient is alert and oriented x 3.  SKIN: No obvious rash, lesion, or ulcer.    LABORATORY PANEL:   CBC  Recent Labs Lab 05/06/16 0410  WBC 6.1  HGB 11.0*  HCT 32.4*  PLT 215   ------------------------------------------------------------------------------------------------------------------  Chemistries   Recent Labs Lab 05/05/16 1632  05/08/16 0507 05/08/16 1140  NA 146*  < > 142 140  K <2.0*  < > 6.0* 5.6*  CL 101  < > 108 107  CO2 33*  < > 31 30  GLUCOSE 115*  < > 104* 101*  BUN 13  < > 7 8  CREATININE 1.60*  < > 0.93 1.03  CALCIUM 7.0*  < > 8.3* 8.7*  MG 0.5*  < > 2.0  --   AST 41  --   --   --   ALT 18  --   --   --  ALKPHOS 62  --   --   --   BILITOT 0.4  --   --   --   < > = values in this interval not displayed. ------------------------------------------------------------------------------------------------------------------  Cardiac Enzymes  Recent Labs Lab 05/05/16 1632  TROPONINI 0.06*   ------------------------------------------------------------------------------------------------------------------  RADIOLOGY:  No results found.  EKG:   Orders  placed or performed during the hospital encounter of 05/05/16  . ED EKG  . ED EKG    ASSESSMENT AND PLAN:   1.s/p fall with rhabdomyolysis;improving with IV hydration 2.severe hypokalemia/hypomagnesemia;/due to diuretic use;which including metozalone;stopped diuretics;Who has hyperkalemia. Stop the replacement, and gave Kayexalate. Potassium decreased from 6-5.6. To monitor the potassium, monitor on telemetry. Appreciate nephrology input.  3.h/o HIV ;on antivirals;d/w ID,no interaction with meds .meds not causing hypokalemia/other electrolyte disturbance   4.acute renal failure omn CKD stage 3;improving  5.deconditioning/fall;PT consulted,home health PT  6.Marland Kitchenacute diarrhea;resolved.stoll cdiff is negative,check stool cultures.given imodium.stopped LInzess. D/w nurse,pt.   All the records are reviewed and case discussed with Care Management/Social Workerr. Management plans discussed with the patient, family and they are in agreement.  CODE STATUS:DNR  TOTAL TIME TAKING CARE OF THIS PATIENT: 35 minutes.   POSSIBLE D/C IN 1-3DAYS, DEPENDING ON CLINICAL CONDITION.   Katha Hamming M.D on 05/08/2016 at 12:32 PM  Between 7am to 6pm - Pager - [phone]  After 6pm go to [email or web address] - password [email or web address]  [facility]  Office  [phone]  CC: Primary care physician; [facility]   Note: This dictation was prepared with Dragon dictation along with smaller phrase technology. Any transcriptional errors that result from this process are unintentional.

## 2016-05-09 ENCOUNTER — Inpatient Hospital Stay: Payer: Medicare Other

## 2016-05-09 LAB — BASIC METABOLIC PANEL
ANION GAP: 5 (ref 5–15)
BUN: 7 mg/dL (ref 6–20)
CALCIUM: 8.9 mg/dL (ref 8.9–10.3)
CO2: 31 mmol/L (ref 22–32)
Chloride: 106 mmol/L (ref 101–111)
Creatinine, Ser: 0.97 mg/dL (ref 0.61–1.24)
GFR calc Af Amer: >60 mL/min (ref >60)
Glucose, Bld: 97 mg/dL (ref 65–99)
POTASSIUM: 5 mmol/L (ref 3.5–5.1)
SODIUM: 142 mmol/L (ref 135–145)

## 2016-05-09 MED ORDER — ENOXAPARIN SODIUM 40 MG/0.4ML ~~LOC~~ SOLN
40.0000 mg | SUBCUTANEOUS | Status: DC
  Administered 2016-05-09 – 2016-05-12 (×4): 40 mg via SUBCUTANEOUS
  Filled 2016-05-09 (×4): qty 0.4

## 2016-05-09 MED ORDER — IPRATROPIUM-ALBUTEROL 0.5-2.5 (3) MG/3ML IN SOLN
3.0000 mL | Freq: Four times a day (QID) | RESPIRATORY_TRACT | Status: DC
  Administered 2016-05-09 – 2016-05-11 (×9): 3 mL via RESPIRATORY_TRACT
  Filled 2016-05-09 (×9): qty 3

## 2016-05-09 MED ORDER — METHYLPREDNISOLONE SODIUM SUCC 125 MG IJ SOLR: 60.0000 mg | INTRAMUSCULAR | Status: DC

## 2016-05-09 NOTE — Progress Notes (Addendum)
[facility] - [place] at [facility]   PATIENT NAME: Brian Hampton    MR#:  [id number]  DATE OF BIRTH:  10-Jul-1956  SUBJECTIVE: He says he is having some wheezing. No other complaints.   CHIEF COMPLAINT:   Chief Complaint  Patient presents with  . Weakness    REVIEW OF SYSTEMS:   ROS CONSTITUTIONAL: No fever, fatigue or weakness.  EYES: No blurred or double vision.  EARS, NOSE, AND THROAT: No tinnitus or ear pain.  RESPIRATORY: No cough, shortness of breath, wheezing or hemoptysis.  CARDIOVASCULAR: No chest pain, orthopnea, edema.  GASTROINTESTINAL: No nausea, vomiting, diarrhea or abdominal pain.  GENITOURINARY: No dysuria, hematuria.  ENDOCRINE: No polyuria, nocturia,  HEMATOLOGY: No anemia, easy bruising or bleeding SKIN: No rash or lesion. MUSCULOSKELETAL: No joint pain or arthritis.   NEUROLOGIC: No tingling, numbness, weakness.  PSYCHIATRY: No anxiety or depression.   DRUG ALLERGIES:   Allergies  Allergen Reactions  . Aspirin Anaphylaxis  . Bee Venom Anaphylaxis  . Penicillins Anaphylaxis and Other (See Comments)    Has patient had a PCN reaction causing immediate rash, facial/tongue/throat swelling, SOB or lightheadedness with hypotension: Yes Has patient had a PCN reaction causing severe rash involving mucus membranes or skin necrosis: No Has patient had a PCN reaction that required hospitalization No Has patient had a PCN reaction occurring within the last 10 years: Yes If all of the above answers are "NO", then may proceed with Cephalosporin use.  . Prednisone Anaphylaxis  . Sulfa Antibiotics Anaphylaxis  . Sulfasalazine Anaphylaxis  . Theophyllines Anaphylaxis  . Theophylline Swelling    VITALS:  Blood pressure 119/73, pulse 77, temperature 98.3 F (36.8 C), temperature source Oral, resp. rate 17, height 5\' 5"  (1.651 m), weight 75.1 kg (165 lb 9.6 oz), SpO2 97 %.  PHYSICAL EXAMINATION:  GENERAL:  60 y.o.-year-old patient lying in  the bed with no acute distress.  EYES: Pupils equal, round, reactive to light and accommodation. No scleral icterus. Extraocular muscles intact.  HEENT: Head atraumatic, normocephalic. Oropharynx and nasopharynx clear.  NECK:  Supple, no jugular venous distention. No thyroid enlargement, no tenderness.  LUNGS: Expiratory wheezing both lungs.. No use of accessory muscles of respiration.  CARDIOVASCULAR: S1, S2 normal. No murmurs, rubs, or gallops.  ABDOMEN: Soft, nontender, nondistended. Bowel sounds present. No organomegaly or mass.  EXTREMITIES: No pedal edema, cyanosis, or clubbing.  NEUROLOGIC: Cranial nerves II through XII are intact. Muscle strength 5/5 in all extremities. Sensation intact. Gait not checked.  PSYCHIATRIC: The patient is alert and oriented x 3.  SKIN: No obvious rash, lesion, or ulcer.    LABORATORY PANEL:   CBC  Recent Labs Lab 05/06/16 0410  WBC 6.1  HGB 11.0*  HCT 32.4*  PLT 215   ------------------------------------------------------------------------------------------------------------------  Chemistries   Recent Labs Lab 05/05/16 1632  05/08/16 0507  05/09/16 0417  NA 146*  < > 142  < > 142  K <2.0*  < > 6.0*  < > 5.0  CL 101  < > 108  < > 106  CO2 33*  < > 31  < > 31  GLUCOSE 115*  < > 104*  < > 97  BUN 13  < > 7  < > 7  CREATININE 1.60*  < > 0.93  < > 0.97  CALCIUM 7.0*  < > 8.3*  < > 8.9  MG 0.5*  < > 2.0  --   --   AST 41  --   --   --   --  ALT 18  --   --   --   --   ALKPHOS 62  --   --   --   --   BILITOT 0.4  --   --   --   --   < > = values in this interval not displayed. ------------------------------------------------------------------------------------------------------------------  Cardiac Enzymes  Recent Labs Lab 05/05/16 1632  TROPONINI 0.06*   ------------------------------------------------------------------------------------------------------------------  RADIOLOGY:  No results found.  EKG:   Orders placed  or performed during the hospital encounter of 05/05/16  . ED EKG  . ED EKG    ASSESSMENT AND PLAN:   1.s/p fall with rhabdomyolysis;improving with IV hydration 2.severe hypokalemia/hypomagnesemia;/due to diuretic use;which including metozalone;stopped diuretics;Who has hyperkalemia. Stop the replacement, and gave Kayexalate.hLyperkalemia improved. To monitor the potassium, monitor on telemetry. Appreciate nephrology input.  3.h/o HIV ;on antivirals;d/w ID,no interaction with meds .meds not causing hypokalemia/other electrolyte disturbance   4.acute renal failure omn CKD stage 3;improving  5.deconditioning/fall;PT consulted,home health PT   6.Marland Kitchen.acute diarrhea;resolved.stoll cdiff is negative,check stool cultures.given imodium.stopped LInzess. D/w nurse,pt. 7.history of COPD now with the exacerbation: Continue  duoneb.patient is allergic to steroids.  get  chest x-ray today.  All the records are reviewed and case discussed with Care Management/Social Workerr. Management plans discussed with the patient, family and they are in agreement.  CODE STATUS:DNR  TOTAL TIME TAKING CARE OF THIS PATIENT: 35 minutes.   POSSIBLE D/C IN 1-3DAYS, DEPENDING ON CLINICAL CONDITION.   Katha HammingKONIDENA,Fayelynn Distel M.D on 05/09/2016 at 11:19 AM  Between 7am to 6pm - Pager - [phone]  After 6pm go to [email or web address] - password [email or web address]  [facility] [place] [facility]  Office  [phone]  CC: Primary care physician; [facility]   Note: This dictation was prepared with Dragon dictation along with smaller phrase technology. Any transcriptional errors that result from this process are unintentional.

## 2016-05-09 NOTE — Progress Notes (Signed)
MEDICATION RELATED CONSULT NOTE - ELECTROLYTES  Pharmacy Consult for Electrolyte replacement  Allergies  Allergen Reactions  . Aspirin Anaphylaxis  . Bee Venom Anaphylaxis  . Penicillins Anaphylaxis and Other (See Comments)    Has patient had a PCN reaction causing immediate rash, facial/tongue/throat swelling, SOB or lightheadedness with hypotension: Yes Has patient had a PCN reaction causing severe rash involving mucus membranes or skin necrosis: No Has patient had a PCN reaction that required hospitalization No Has patient had a PCN reaction occurring within the last 10 years: Yes If all of the above answers are "NO", then may proceed with Cephalosporin use.  . Prednisone Anaphylaxis  . Sulfa Antibiotics Anaphylaxis  . Sulfasalazine Anaphylaxis  . Theophyllines Anaphylaxis  . Theophylline Swelling    Patient Measurements: Height: 5\' 5"  (165.1 cm) Weight: 165 lb 9.6 oz (75.1 kg) IBW/kg (Calculated) : 61.5   Vital Signs: Temp: 98.3 F (36.8 C) (10/01 0803) Temp Source: Oral (10/01 0803) BP: 119/73 (10/01 0803) Pulse Rate: 77 (10/01 0803) Intake/Output from previous day: 09/30 0701 - 10/01 0700 In: 861.3 [I.V.:861.3] Out: 1946 [Urine:1945; Stool:1] Intake/Output from this shift: Total I/O In: -  Out: 300 [Urine:300]  Labs:  Recent Labs  05/06/16 1059  05/07/16 0437 05/08/16 0507 05/08/16 1140 05/08/16 1630 05/09/16 0417  CREATININE  --   < > 1.11 0.93 1.03 1.04 0.97  MG 1.8  --  2.3 2.0  --   --   --   PHOS  --   --  2.9  --   --   --   --   < > = values in this interval not displayed. Estimated Creatinine Clearance: 77.6 mL/min (by C-G formula based on SCr of 0.97 mg/dL).   Microbiology: Recent Results (from the past 720 hour(s))  C difficile quick scan w PCR reflex     Status: None   Collection Time: 05/06/16  8:55 PM  Result Value Ref Range Status   C Diff antigen NEGATIVE NEGATIVE Final   C Diff toxin NEGATIVE NEGATIVE Final   C Diff  interpretation No C. difficile detected.  Final    Medical History: Past Medical History:  Diagnosis Date  . Anemia   . Asthma   . Collagen vascular disease (HCC)   . COPD (chronic obstructive pulmonary disease) (HCC)   . Coronary artery disease   . DVT (deep venous thrombosis) (HCC)   . Emphysema/COPD (HCC)   . HIV (human immunodeficiency virus infection) (HCC)   . Hypertension   . Lung mass   . Myocardial infarction    in 2000  . Type 2 diabetes mellitus [facility](HCC)      Assessment: Pharmacy consulted for electrolyte replacement for 60 yo male with multiple electrolyte disturbances. Patient with hyperkalemia on 9/30.   Plan:  Electrolytes are within normal limits. Will obtain follow up electrolytes with am labs.    Pharmacy will continue to monitor and adjust per consult.    Zai Chmiel L 05/09/2016,9:08 AM

## 2016-05-09 NOTE — Care Management Important Message (Signed)
Important Message  Patient Details  Name: Brian Hampton MRN: [id number] Date of Birth: [birth year]   Medicare Important Message Given:  Yes    Minsa Weddington A, RN 05/09/2016, 4:09 PM

## 2016-05-09 NOTE — Progress Notes (Signed)
[facility]  ROUNDING NOTE   Subjective:  Patient initially presented with hypokalemia. With aggressive repletion efforts potassium was repleted and in fact was a bit high yesterday. Potassium now normalized at 5.0. Hypernatremia also corrected and serum sodium is 142. However overall patient remains weak.  Objective:  Vital signs in last 24 hours:  Temp:  [98.2 F (36.8 C)-98.7 F (37.1 C)] 98.2 F (36.8 C) (10/01 1126) Pulse Rate:  [74-77] 74 (10/01 1126) Resp:  [17-18] 17 (10/01 1126) BP: (105-128)/(66-74) 105/66 (10/01 1126) SpO2:  [96 %-99 %] 96 % (10/01 1309)  Weight change:  Filed Weights   05/05/16 1630 05/05/16 2226  Weight: 70.3 kg (155 lb) 75.1 kg (165 lb 9.6 oz)    Intake/Output: I/O last 3 completed shifts: In: 1926.3 [P.O.:240; I.V.:1686.3] Out: 2146 [Urine:2145; Stool:1]   Intake/Output this shift:  Total I/O In: 363 [P.O.:360; I.V.:3] Out: 600 [Urine:600]  Physical Exam: General: No acute distress  Head: Normocephalic, atraumatic. Moist oral mucosal membranes  Eyes: Anicteric  Neck: Supple, trachea midline  Lungs:  Clear to auscultation, normal effort  Heart: S1S2 no rubs  Abdomen:  Soft, nontender, bowel sounds present  Extremities: 1+ peripheral edema.  Neurologic: Nonfocal, moving all four extremities  Skin: No lesions       Basic Metabolic Panel:  Recent Labs Lab 05/05/16 1632  05/06/16 0410 05/06/16 1059  05/07/16 0437  05/07/16 1524 05/08/16 0507 05/08/16 1140 05/08/16 1630 05/09/16 0417  NA 146*  < > 148*  --   --  146*  --   --  142 140 140 142  K <2.0*  < > <2.0* <2.0*  < > 2.7*  < > 4.2 6.0* 5.6* 5.6* 5.0  CL 101  < > 105  --   --  105  --   --  108 107 106 106  CO2 33*  < > 38*  --   --  34*  --   --  31 30 29 31   GLUCOSE 115*  < > 90  --   --  123*  --   --  104* 101* 100* 97  BUN 13  < > 13  --   --  8  --   --  7 8 8 7   CREATININE 1.60*  < > 1.31*  --   --  1.11  --   --  0.93 1.03 1.04 0.97  CALCIUM  7.0*  < > 6.4*  --   --  7.5*  --   --  8.3* 8.7* 9.1 8.9  MG 0.5*  --  1.1* 1.8  --  2.3  --   --  2.0  --   --   --   PHOS  --   --   --   --   --  2.9  --   --   --   --   --   --   < > = values in this interval not displayed.  Liver Function Tests:  Recent Labs Lab 05/05/16 1632  AST 41  ALT 18  ALKPHOS 62  BILITOT 0.4  PROT 7.0  ALBUMIN 3.1*   No results for input(s): LIPASE, AMYLASE in the last 168 hours. No results for input(s): AMMONIA in the last 168 hours.  CBC:  Recent Labs Lab 05/05/16 1632 05/06/16 0410  WBC 8.6 6.1  HGB 13.0 11.0*  HCT 39.2* 32.4*  MCV 97.7 97.6  PLT 265 215    Cardiac Enzymes:  Recent Labs Lab 05/05/16 1632 05/06/16 0410  CKTOTAL 2,066* 1,932*  TROPONINI 0.06*  --     BNP: Invalid input(s): POCBNP  CBG: No results for input(s): GLUCAP in the last 168 hours.  Microbiology: Results for orders placed or performed during the hospital encounter of 05/05/16  C difficile quick scan w PCR reflex     Status: None   Collection Time: 05/06/16  8:55 PM  Result Value Ref Range Status   C Diff antigen NEGATIVE NEGATIVE Final   C Diff toxin NEGATIVE NEGATIVE Final   C Diff interpretation No C. difficile detected.  Final    Coagulation Studies: No results for input(s): LABPROT, INR in the last 72 hours.  Urinalysis: No results for input(s): COLORURINE, LABSPEC, PHURINE, GLUCOSEU, HGBUR, BILIRUBINUR, KETONESUR, PROTEINUR, UROBILINOGEN, NITRITE, LEUKOCYTESUR in the last 72 hours.  Invalid input(s): APPERANCEUR    Imaging: Dg Chest 1 View  Result Date: 05/09/2016 CLINICAL DATA:  Pt states he started wheezing last night and was having a hard time catching his breath EXAM: CHEST 1 VIEW COMPARISON:  05/05/2016 FINDINGS: The cardiac silhouette is normal in size and configuration. No mediastinal or hilar masses or evidence of adenopathy. Minor scarring at the left lateral lung base. Lungs are otherwise clear. No pleural effusion. No  pneumothorax. Skeletal structures are demineralized but intact. IMPRESSION: No active disease. Electronically Signed   By: Amie Portlandavid  Ormond M.D.   On: 05/09/2016 11:55     Medications:     . abacavir-dolutegravir-lamiVUDine  1 tablet Oral Daily  . calcium-vitamin D  1 tablet Oral BID  . enoxaparin (LOVENOX) injection  40 mg Subcutaneous Q24H  . fluconazole  200 mg Oral Daily  . fluticasone  1 spray Each Nare Daily  . ipratropium-albuterol  3 mL Nebulization Q6H  . linaclotide  290 mcg Oral QAC breakfast  . loratadine  10 mg Oral Daily  . methimazole  10 mg Oral Daily  . mirtazapine  15 mg Oral QHS  . mometasone-formoterol  2 puff Inhalation BID  . multivitamin with minerals  1 tablet Oral Daily  . pantoprazole  40 mg Oral Daily  . sodium chloride flush  3 mL Intravenous Q12H  . tiotropium  18 mcg Inhalation Daily   acetaminophen, albuterol, clonazePAM, loperamide, nitroGLYCERIN, oxyCODONE-acetaminophen, traMADol  Assessment/ Plan:  60 y.o. male with past medical history of coronary disease, hypertension, COPD, GERD, hyperlipidemia, sleep apnea, depression, HIV, DM  1.  CKD stage III 2.  Hypernatremia. 3.  Severe hypokalemia and hypomagnesemia due to diuretic usage, patient was on 2 diuretics, one of which included metolazone.  4.  Metabolic Alkaosis.    Plan:  Overall patient has improved significantly. Creatinine currently 0.9 and below his baseline. The profound hypokalemia noted upon admission has also improved and potassium is currently 5.0. Hypernatremia has also been corrected and serum sodium is currently 142. No further input from a nephrology perspective therefore we will sign off. We recommend outpatient follow-up with Dr. Thedore MinsSingh in one to 2 weeks after discharge.   LOS: 4 Leticia Mcdiarmid 10/1/20171:43 PM

## 2016-05-10 LAB — BASIC METABOLIC PANEL
ANION GAP: 6 (ref 5–15)
BUN: 10 mg/dL (ref 6–20)
CALCIUM: 8.9 mg/dL (ref 8.9–10.3)
CO2: 31 mmol/L (ref 22–32)
Chloride: 107 mmol/L (ref 101–111)
Creatinine, Ser: 0.97 mg/dL (ref 0.61–1.24)
GFR calc non Af Amer: >60 mL/min (ref >60)
GLUCOSE: 111 mg/dL — AB (ref 65–99)
Potassium: 3.8 mmol/L (ref 3.5–5.1)
Sodium: 144 mmol/L (ref 135–145)

## 2016-05-10 MED ORDER — METHYLPREDNISOLONE SODIUM SUCC 125 MG IJ SOLR
60.0000 mg | Freq: Every day | INTRAMUSCULAR | Status: DC
  Administered 2016-05-10 – 2016-05-12 (×3): 60 mg via INTRAVENOUS
  Filled 2016-05-10 (×3): qty 2

## 2016-05-10 NOTE — Progress Notes (Signed)
Physical Therapy Treatment Patient Details Name: Brian Hampton MRN: [id number] DOB: [birth year] Today's Date: 05/10/2016    History of Present Illness Anis Degidio is a 60yo white male who [facility] after weakness/fall, found to have low K+, and low Mg upon arrival and rhabdmyolysis. PMH: asthma, CPOD, emphysema, HIV, HTN, DM, MI, CAD, and is s/p IVC placement. P comes from a boarding house room on second floor but now has plans to DC and live with sister in a handicapped accessible mobile home.  PLOF inclused limited community distances with a QC.     PT Comments    Pt agrees to session.  Bed mobility with rail and increased time.  Upon standing, pt complained of some dizziness.  Participated in standing exercises at bedside but dizziness did not resolve.  Sat and obtained BP.  129/83.  Standing 113/76 and standing at 3 minutes 99/79 and HR was then noted to be 138.  He rated dizziness at 9/10 while standing.  He was instructed to day back down due to BP/HR and upon returning to supine 121/78 and HR 97.  Discussed with primary nurse.     Follow Up Recommendations  Other (comment);Home health PT     Equipment Recommendations  None recommended by PT    Recommendations for Other Services       Precautions / Restrictions Precautions Precautions: None Restrictions Weight Bearing Restrictions: No    Mobility  Bed Mobility Overal bed mobility: Modified Independent             General bed mobility comments: additional time/effort required, supine to sitting with HOB elevated.   Transfers Overall transfer level: Modified independent Equipment used: Rolling walker (2 wheeled)                Ambulation/Gait             General Gait Details: deferred due to dizziness and bps's   Stairs            Wheelchair Mobility    Modified Rankin (Stroke Patients Only)       Balance Overall balance assessment: Modified Independent                                   Cognition Arousal/Alertness: Awake/alert Behavior During Therapy: WFL for tasks assessed/performed Overall Cognitive Status: Within Functional Limits for tasks assessed                      Exercises Other Exercises Other Exercises: standing exercises x 10 bilaterally for toe raises, SLR, and marches    General Comments        Pertinent Vitals/Pain Pain Assessment: No/denies pain    Home Living                      Prior Function            PT Goals (current goals can now be found in the care plan section)      Frequency    Min 2X/week      PT Plan Current plan remains appropriate    Co-evaluation             End of Session Equipment Utilized During Treatment: Gait belt Activity Tolerance: Treatment limited secondary to medical complications (Comment) Patient left: in bed;with call bell/phone within reach;with bed alarm set     Time: 1000-1024 PT  Time Calculation (min) (ACUTE ONLY): 24 min  Charges:  $Therapeutic Exercise: 8-22 mins $Therapeutic Activity: 8-22 mins                    G Codes:      Danielle DessSarah Tauriel Scronce 05/10/2016, 10:49 AM

## 2016-05-10 NOTE — Progress Notes (Signed)
MEDICATION RELATED CONSULT NOTE - Electrolytes   Pharmacy consulted to assist in electrolyte replacement  Allergies  Allergen Reactions  . Aspirin Anaphylaxis  . Bee Venom Anaphylaxis  . Penicillins Anaphylaxis and Other (See Comments)    Has patient had a PCN reaction causing immediate rash, facial/tongue/throat swelling, SOB or lightheadedness with hypotension: Yes Has patient had a PCN reaction causing severe rash involving mucus membranes or skin necrosis: No Has patient had a PCN reaction that required hospitalization No Has patient had a PCN reaction occurring within the last 10 years: Yes If all of the above answers are "NO", then may proceed with Cephalosporin use.  . Prednisone Anaphylaxis  . Sulfa Antibiotics Anaphylaxis  . Sulfasalazine Anaphylaxis  . Theophyllines Anaphylaxis  . Theophylline Swelling    Patient Measurements: Height: $RemoveBeforeD [id number]$5\' 5" (Calculated) : 61.5  Vital Signs: Temp: 98.2 F (36.8 C) (10/02 0419) Temp Source: Oral (10/02 0419) BP: 104/79 (10/02 0419) Pulse Rate: 90 (10/02 0419) Intake/Output from previous day: 10/01 0701 - 10/02 0700 In: 726 [P.O.:720; I.V.:6] Out: 1725 [Urine:1725] Intake/Output from this shift: No intake/output data recorded.  Labs:  Recent Labs  05/08/16 0507  05/08/16 1630 05/09/16 0417 05/10/16 0433  CREATININE 0.93  < > 1.04 0.97 0.97  MG 2.0  --   --   --   --   < > = values in this interval not displayed. Estimated Creatinine Clearance: 77.6 mL/min (by C-G formula based on SCr of 0.97 mg/dL).   Microbiology: Recent Results (from the past 720 hour(s))  C difficile quick scan w PCR reflex     Status: None   Collection Time: 05/06/16  8:55 PM  Result Value Ref Range Status   C Diff antigen NEGATIVE NEGATIVE Final   C Diff toxin NEGATIVE NEGATIVE Final   C Diff interpretation No C. difficile detected.  Final    Medications:  Scheduled:  .  abacavir-dolutegravir-lamiVUDine  1 tablet Oral Daily  . calcium-vitamin D  1 tablet Oral BID  . enoxaparin (LOVENOX) injection  40 mg Subcutaneous Q24H  . fluconazole  200 mg Oral Daily  . fluticasone  1 spray Each Nare Daily  . ipratropium-albuterol  3 mL Nebulization Q6H  . linaclotide  290 mcg Oral QAC breakfast  . loratadine  10 mg Oral Daily  . methimazole  10 mg Oral Daily  . mirtazapine  15 mg Oral QHS  . mometasone-formoterol  2 puff Inhalation BID  . multivitamin with minerals  1 tablet Oral Daily  . pantoprazole  40 mg Oral Daily  . sodium chloride flush  3 mL Intravenous Q12H  . tiotropium  18 mcg Inhalation Daily    Assessment: Pharmacy consulted for electrolyte replacement for 60 yo male with multiple electrolyte disturbances. Patient with hyperkalemia on 9/30.   Plan:  10/2 am labs K = 3.8 Electrolytes are within normal limits . Will obtain follow up electrolytes with am labs.    Pharmacy will continue to monitor and adjust per consult.    Horris LatinoHolly Gilliam, PharmD Pharmacy Resident 05/10/2016 7:29 AM

## 2016-05-10 NOTE — Consult Note (Signed)
   [facility]   05/10/2016  Brian Hampton 03-10-1956 [id number]    [facility] referral received. Spoke with Mr. Brian Hampton via phone to discuss and explain [facility]. [facility]. However, it appears he may have home palliative services in place. Therefore, [facility] would not be appropriate. Spoke with inpatient RNCM, Nann, to discuss. States she will confirm whether patient has palliative services and will Pharmacist, hospitallet writer know. Explained that if palliative is involved, [facility] Care [facility] will not follow. However, if they are not, [facility] will follow. Will await confirmation from inpatient RNCM before requesting patient to [facility].   Raiford NobleAtika Jomari Bartnik, MSN-Ed, RN,BSN [facility] Care [facility] Hospital Liaison [phone]

## 2016-05-10 NOTE — Progress Notes (Addendum)
[facility] [place] [facility]   PATIENT NAME: Brian Hampton    MR#:  [id number]  DATE OF BIRTH:  [birth year]  SUBJECTIVE: Seen complaint of shortness of breath, more wheezing last night. Chest x-ray negative for pneumonia. Told him that he needs to take steroid IV   CHIEF COMPLAINT:   Chief Complaint  Patient presents with  . Weakness    REVIEW OF SYSTEMS:   ROS CONSTITUTIONAL: No fever, fatigue or weakness.  EYES: No blurred or double vision.  EARS, NOSE, AND THROAT: No tinnitus or ear pain.  RESPIRATORY: Cough, shortness of breath, pleuritic chest pain. CARDIOVASCULAR: No chest pain, orthopnea, edema.  GASTROINTESTINAL: No nausea, vomiting, diarrhea or abdominal pain.  GENITOURINARY: No dysuria, hematuria.  ENDOCRINE: No polyuria, nocturia,  HEMATOLOGY: No anemia, easy bruising or bleeding SKIN: No rash or lesion. MUSCULOSKELETAL: No joint pain or arthritis.   NEUROLOGIC: No tingling, numbness, weakness.  PSYCHIATRY: No anxiety or depression.   DRUG ALLERGIES:   Allergies  Allergen Reactions  . Aspirin Anaphylaxis  . Bee Venom Anaphylaxis  . Penicillins Anaphylaxis and Other (See Comments)    Has patient had a PCN reaction causing immediate rash, facial/tongue/throat swelling, SOB or lightheadedness with hypotension: Yes Has patient had a PCN reaction causing severe rash involving mucus membranes or skin necrosis: No Has patient had a PCN reaction that required hospitalization No Has patient had a PCN reaction occurring within the last 10 years: Yes If all of the above answers are "NO", then may proceed with Cephalosporin use.  . Prednisone Anaphylaxis  . Sulfa Antibiotics Anaphylaxis  . Sulfasalazine Anaphylaxis  . Theophyllines Anaphylaxis  . Theophylline Swelling    VITALS:  Blood pressure 98/64, pulse 92, temperature 98 F (36.7 C), temperature source Oral, resp. rate 14, height 5\' 5"  (1.651 m), weight 75.1 kg (165 lb 9.6 oz), SpO2  98 %.  PHYSICAL EXAMINATION:  GENERAL:  60 y.o.-year-old patient lying in the bed with no acute distress.  EYES: Pupils equal, round, reactive to light and accommodation. No scleral icterus. Extraocular muscles intact.  HEENT: Head atraumatic, normocephalic. Oropharynx and nasopharynx clear.  NECK:  Supple, no jugular venous distention. No thyroid enlargement, no tenderness.  LUNGS: Expiratory wheezing both lungs.. No use of accessory muscles of respiration.  CARDIOVASCULAR: S1, S2 normal. No murmurs, rubs, or gallops.  ABDOMEN: Soft, nontender, nondistended. Bowel sounds present. No organomegaly or mass.  EXTREMITIES: No pedal edema, cyanosis, or clubbing.  NEUROLOGIC: Cranial nerves II through XII are intact. Muscle strength 5/5 in all extremities. Sensation intact. Gait not checked.  PSYCHIATRIC: The patient is alert and oriented x 3.  SKIN: No obvious rash, lesion, or ulcer.    LABORATORY PANEL:   CBC  Recent Labs Lab 05/06/16 0410  WBC 6.1  HGB 11.0*  HCT 32.4*  PLT 215   ------------------------------------------------------------------------------------------------------------------  Chemistries   Recent Labs Lab 05/05/16 1632  05/08/16 0507  05/10/16 0433  NA 146*  < > 142  < > 144  K <2.0*  < > 6.0*  < > 3.8  CL 101  < > 108  < > 107  CO2 33*  < > 31  < > 31  GLUCOSE 115*  < > 104*  < > 111*  BUN 13  < > 7  < > 10  CREATININE 1.60*  < > 0.93  < > 0.97  CALCIUM 7.0*  < > 8.3*  < > 8.9  MG 0.5*  < > 2.0  --   --  AST 41  --   --   --   --   ALT 18  --   --   --   --   ALKPHOS 62  --   --   --   --   BILITOT 0.4  --   --   --   --   < > = values in this interval not displayed. ------------------------------------------------------------------------------------------------------------------  Cardiac Enzymes  Recent Labs Lab 05/05/16 1632  TROPONINI 0.06*    ------------------------------------------------------------------------------------------------------------------  RADIOLOGY:  Dg Chest 1 View  Result Date: 05/09/2016 CLINICAL DATA:  Pt states he started wheezing last night and was having a hard time catching his breath EXAM: CHEST 1 VIEW COMPARISON:  05/05/2016 FINDINGS: The cardiac silhouette is normal in size and configuration. No mediastinal or hilar masses or evidence of adenopathy. Minor scarring at the left lateral lung base. Lungs are otherwise clear. No pleural effusion. No pneumothorax. Skeletal structures are demineralized but intact. IMPRESSION: No active disease. Electronically Signed   By: Amie Portland M.D.   On: 05/09/2016 11:55    EKG:   Orders placed or performed during the hospital encounter of 05/05/16  . ED EKG  . ED EKG    ASSESSMENT AND PLAN:   1.s/p fall with rhabdomyolysis;improving with IV hydration 2.severe hypokalemia/hypomagnesemia;/due to diuretic use;which including metozalone;stopped diuretics;Who has hyperkalemia. Stop the replacement, and gave Kayexalate.hLyperkalemia improved.  To monitor the potassium, monitor on telemetry. Appreciate nephrology input.  3.h/o HIV ;on antivirals;d/w ID,no interaction with meds .meds not causing hypokalemia/other electrolyte disturbance   4.acute renal failure omn CKD stage 3;improving  5.deconditioning/fall;PT consulted,home health PT   6.Marland Kitchenacute diarrhea;resolved.stoll cdiff is negative,check stool cultures.given imodium.stopped LInzess. D/w nurse,pt. 7.history of COPD now with the exacerbation: Continue  Duoneb. Chest x-ray negative for pneumonia. Steroid gives him swelling of legs but no other complaints. So we will start IV Solu-Medrol 40 daily. Pleuritic chest pain likely due to wheezing. Told him that if the his breathing improves likely discharge him tomorrow.  All the records are reviewed and case discussed with Care Management/Social  Workerr. Management plans discussed with the patient, family and they are in agreement.  CODE STATUS:DNR  TOTAL TIME TAKING CARE OF THIS PATIENT: 35 minutes.   POSSIBLE D/C IN 1-3DAYS, DEPENDING ON CLINICAL CONDITION.   Katha Hamming M.D on 05/10/2016 at 1:26 PM  Between 7am to 6pm - Pager - [phone]  After 6pm go to [email or web address] - password [email or web address]  [facility]  Office  [phone]  CC: Primary care physician; [facility]   Note: This dictation was prepared with Dragon dictation along with smaller phrase technology. Any transcriptional errors that result from this process are unintentional.

## 2016-05-10 NOTE — Care Management (Signed)
Patient's potassium level has increased from the severe hypokalemia to the point he needed kayexelate.   There is documentation present that [facility].  Patient is not followed by any hospice agency.  He is currently followed by [facility].  Physical therapy has evaluated today and recommending home health.  Documents that patient is to discharge to his sister's home which is handicapped equiped.  Spoke with patient's sister Junious DresserConnie and this is not the plan.  Patient is to discharge back to his present home (the landlord is going to put him in a lower level room.  have asked to have palliative care to follow as an outpatient ( this is not hospice).  [facility] informed care manager  if palliative care follows as an outpatient, patient will not qualify for thn follow up

## 2016-05-10 NOTE — Progress Notes (Signed)
[facility]  ROUNDING NOTE   Subjective:   Patient complains of shortness of breath and wheezing.  Electrolytes and creatinine improved.   Patient was having diarrhea yesterday.   Objective:  Vital signs in last 24 hours:  Temp:  [98.2 F (36.8 C)-98.5 F (36.9 C)] 98.2 F (36.8 C) (10/02 0419) Pulse Rate:  [74-90] 90 (10/02 0419) Resp:  [17-20] 18 (10/02 0419) BP: (103-105)/(66-91) 104/79 (10/02 0419) SpO2:  [95 %-99 %] 95 % (10/02 0842)  Weight change:  Filed Weights   05/05/16 1630 05/05/16 2226  Weight: 70.3 kg (155 lb) 75.1 kg (165 lb 9.6 oz)    Intake/Output: I/O last 3 completed shifts: In: 726 [P.O.:720; I.V.:6] Out: 3100 [Urine:3100]   Intake/Output this shift:  Total I/O In: 240 [P.O.:240] Out: 250 [Urine:250]  Physical Exam: General: No acute distress, laying in bed  Head: Normocephalic, atraumatic. Moist oral mucosal membranes  Eyes: Anicteric  Neck: Supple, trachea midline  Lungs:  Clear to auscultation, normal effort  Heart: S1S2 no rubs  Abdomen:  Soft, nontender, bowel sounds present  Extremities: No  peripheral edema.  Neurologic: Nonfocal, moving all four extremities  Skin: No lesions       Basic Metabolic Panel:  Recent Labs Lab 05/05/16 1632  05/06/16 0410 05/06/16 1059  05/07/16 0437  05/08/16 0507 05/08/16 1140 05/08/16 1630 05/09/16 0417 05/10/16 0433  NA 146*  < > 148*  --   --  146*  --  142 140 140 142 144  K <2.0*  < > <2.0* <2.0*  < > 2.7*  < > 6.0* 5.6* 5.6* 5.0 3.8  CL 101  < > 105  --   --  105  --  108 107 106 106 107  CO2 33*  < > 38*  --   --  34*  --  31 30 29 31 31   GLUCOSE 115*  < > 90  --   --  123*  --  104* 101* 100* 97 111*  BUN 13  < > 13  --   --  8  --  7 8 8 7 10   CREATININE 1.60*  < > 1.31*  --   --  1.11  --  0.93 1.03 1.04 0.97 0.97  CALCIUM 7.0*  < > 6.4*  --   --  7.5*  --  8.3* 8.7* 9.1 8.9 8.9  MG 0.5*  --  1.1* 1.8  --  2.3  --  2.0  --   --   --   --   PHOS  --   --   --   --    --  2.9  --   --   --   --   --   --   < > = values in this interval not displayed.  Liver Function Tests:  Recent Labs Lab 05/05/16 1632  AST 41  ALT 18  ALKPHOS 62  BILITOT 0.4  PROT 7.0  ALBUMIN 3.1*   No results for input(s): LIPASE, AMYLASE in the last 168 hours. No results for input(s): AMMONIA in the last 168 hours.  CBC:  Recent Labs Lab 05/05/16 1632 05/06/16 0410  WBC 8.6 6.1  HGB 13.0 11.0*  HCT 39.2* 32.4*  MCV 97.7 97.6  PLT 265 215    Cardiac Enzymes:  Recent Labs Lab 05/05/16 1632 05/06/16 0410  CKTOTAL 2,066* 1,932*  TROPONINI 0.06*  --     BNP: Invalid input(s): POCBNP  CBG: No results  for input(s): GLUCAP in the last 168 hours.  Microbiology: Results for orders placed or performed during the hospital encounter of 05/05/16  C difficile quick scan w PCR reflex     Status: None   Collection Time: 05/06/16  8:55 PM  Result Value Ref Range Status   C Diff antigen NEGATIVE NEGATIVE Final   C Diff toxin NEGATIVE NEGATIVE Final   C Diff interpretation No C. difficile detected.  Final    Coagulation Studies: No results for input(s): LABPROT, INR in the last 72 hours.  Urinalysis: No results for input(s): COLORURINE, LABSPEC, PHURINE, GLUCOSEU, HGBUR, BILIRUBINUR, KETONESUR, PROTEINUR, UROBILINOGEN, NITRITE, LEUKOCYTESUR in the last 72 hours.  Invalid input(s): APPERANCEUR    Imaging: Dg Chest 1 View  Result Date: 05/09/2016 CLINICAL DATA:  Pt states he started wheezing last night and was having a hard time catching his breath EXAM: CHEST 1 VIEW COMPARISON:  05/05/2016 FINDINGS: The cardiac silhouette is normal in size and configuration. No mediastinal or hilar masses or evidence of adenopathy. Minor scarring at the left lateral lung base. Lungs are otherwise clear. No pleural effusion. No pneumothorax. Skeletal structures are demineralized but intact. IMPRESSION: No active disease. Electronically Signed   By: Amie Portlandavid  Ormond M.D.   On:  05/09/2016 11:55     Medications:     . abacavir-dolutegravir-lamiVUDine  1 tablet Oral Daily  . calcium-vitamin D  1 tablet Oral BID  . enoxaparin (LOVENOX) injection  40 mg Subcutaneous Q24H  . fluconazole  200 mg Oral Daily  . fluticasone  1 spray Each Nare Daily  . ipratropium-albuterol  3 mL Nebulization Q6H  . linaclotide  290 mcg Oral QAC breakfast  . loratadine  10 mg Oral Daily  . methimazole  10 mg Oral Daily  . mirtazapine  15 mg Oral QHS  . mometasone-formoterol  2 puff Inhalation BID  . multivitamin with minerals  1 tablet Oral Daily  . pantoprazole  40 mg Oral Daily  . sodium chloride flush  3 mL Intravenous Q12H  . tiotropium  18 mcg Inhalation Daily   acetaminophen, albuterol, clonazePAM, loperamide, nitroGLYCERIN, oxyCODONE-acetaminophen, traMADol  Assessment/ Plan:  60 y.o. white male with coronary disease, hypertension, COPD, GERD, hyperlipidemia, sleep apnea, depression, HIV, Diabetes mellitus type II  1.  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 Baseline GFR of 59. Creatinine better than baseline currently.   2. Hyperkalemia: improved, secondary to iatrogenic. Admitted with hypokalemia.   3.  Hypernatremia: improved.   4. Metabolic acidosis: from GI losses. Improved.   Recommend outpatient follow-up with Dr. Thedore MinsSingh    LOS: 5 Merian Wroe 10/2/20179:56 AM

## 2016-05-10 NOTE — Progress Notes (Signed)
Patient remains weak,ambulated with PT today,persistent bilateral lower extremities edema,discharge plan is home with home health PT

## 2016-05-11 LAB — GLUCOSE, CAPILLARY: Glucose-Capillary: 132 mg/dL — ABNORMAL HIGH (ref 65–99)

## 2016-05-11 MED ORDER — LEVALBUTEROL HCL 1.25 MG/0.5ML IN NEBU: 1.2500 mg | INHALATION_SOLUTION | Freq: Four times a day (QID) | RESPIRATORY_TRACT | Status: DC

## 2016-05-11 MED ORDER — LEVALBUTEROL HCL 1.25 MG/0.5ML IN NEBU
1.2500 mg | INHALATION_SOLUTION | Freq: Four times a day (QID) | RESPIRATORY_TRACT | Status: DC
  Administered 2016-05-11 – 2016-05-12 (×3): 1.25 mg via RESPIRATORY_TRACT
  Filled 2016-05-11 (×4): qty 0.5

## 2016-05-11 MED ORDER — LEVALBUTEROL TARTRATE 45 MCG/ACT IN AERO: 2.0000 | INHALATION_SPRAY | Freq: Four times a day (QID) | RESPIRATORY_TRACT | Status: DC

## 2016-05-11 NOTE — Progress Notes (Signed)
Physical Therapy Treatment Patient Details Name: DREAM NODAL MRN: [id number] DOB: 04/18/1956 Today's Date: 05/11/2016    History of Present Illness Eusebio Blazejewski is a 60yo white male who comes to [facility] after weakness/fall, found to have low K+, and low Mg upon arrival and rhabdmyolysis. PMH: asthma, CPOD, emphysema, HIV, HTN, DM, MI, CAD, and is s/p IVC placement. P comes from a boarding house room on second floor but now has plans to DC and live with sister in a handicapped accessible mobile home.  PLOF inclused limited community distances with a QC.     PT Comments    Pt presented in bed agreeable to therapy.  Pt performed supine to sit and sit to stand with no c/o fatigue or dizziness. Pt ambulated with RW with CGA/Close S with slow cadence. Although no s/s per pt, HR increased to 155 per nsg.  Pt returned to room and decreased to 135 upon sitting.  PTA answered general questions on HHPT and d/c which pt was comfortable with answers.  At end of session pt's HR decreased to 122 thus therex deferred. Pt left in chair with call bell within reach and all needs met.     Follow Up Recommendations  Home health PT     Equipment Recommendations  None recommended by PT    Recommendations for Other Services       Precautions / Restrictions Precautions Precautions: None Restrictions Weight Bearing Restrictions: No    Mobility  Bed Mobility Overal bed mobility: Modified Independent                Transfers Overall transfer level: Modified independent Equipment used: Rolling walker (2 wheeled)                Ambulation/Gait Ambulation/Gait assistance: Min guard Ambulation Distance (Feet): 180 Feet Assistive device: Rolling walker (2 wheeled) Gait Pattern/deviations: WFL(Within Functional Limits);Step-through pattern     General Gait Details: slow self selected cadence   Stairs            Wheelchair Mobility    Modified Rankin (Stroke Patients Only)        Balance Overall balance assessment: Modified Independent                                  Cognition Arousal/Alertness: Awake/alert Behavior During Therapy: WFL for tasks assessed/performed Overall Cognitive Status: Within Functional Limits for tasks assessed                      Exercises      General Comments        Pertinent Vitals/Pain Pain Assessment: No/denies pain    Home Living                      Prior Function            PT Goals (current goals can now be found in the care plan section)      Frequency    Min 2X/week      PT Plan Current plan remains appropriate    Co-evaluation             End of Session Equipment Utilized During Treatment: Gait belt Activity Tolerance: Treatment limited secondary to medical complications (Comment) Patient left: in chair;with call bell/phone within reach;with chair alarm set     Time: 1016-1040 PT Time Calculation (min) (ACUTE ONLY): 24 min  Charges:  $Gait Training: 8-22 mins $Therapeutic Activity: 8-22 mins                    G Codes:      Maevis Mumby  Kaysea Raya, PTA 05/11/2016, 12:34 PM

## 2016-05-11 NOTE — Progress Notes (Signed)
[facility] - [place] at [facility]   PATIENT NAME: Brian Hampton    MR#:  [id number]  DATE OF BIRTH:  [birth year]  SUBJECTIVE:seen today,HR upto 150 with ambulation.wheezing improved.SOB improved.  CHIEF COMPLAINT:   Chief Complaint  Patient presents with  . Weakness    REVIEW OF SYSTEMS:   ROS CONSTITUTIONAL: No fever, fatigue or weakness.  EYES: No blurred or double vision.  EARS, NOSE, AND THROAT: No tinnitus or ear pain.  RESPIRATORY: Cough, shortness of breath, pleuritic chest pain. CARDIOVASCULAR: No chest pain, orthopnea, edema.  GASTROINTESTINAL: No nausea, vomiting, diarrhea or abdominal pain.  GENITOURINARY: No dysuria, hematuria.  ENDOCRINE: No polyuria, nocturia,  HEMATOLOGY: No anemia, easy bruising or bleeding SKIN: No rash or lesion. MUSCULOSKELETAL: No joint pain or arthritis.   NEUROLOGIC: No tingling, numbness, weakness.  PSYCHIATRY: No anxiety or depression.   DRUG ALLERGIES:   Allergies  Allergen Reactions  . Aspirin Anaphylaxis  . Bee Venom Anaphylaxis  . Penicillins Anaphylaxis and Other (See Comments)    Has patient had a PCN reaction causing immediate rash, facial/tongue/throat swelling, SOB or lightheadedness with hypotension: Yes Has patient had a PCN reaction causing severe rash involving mucus membranes or skin necrosis: No Has patient had a PCN reaction that required hospitalization No Has patient had a PCN reaction occurring within the last 10 years: Yes If all of the above answers are "NO", then may proceed with Cephalosporin use.  . Prednisone Anaphylaxis  . Sulfa Antibiotics Anaphylaxis  . Sulfasalazine Anaphylaxis  . Theophyllines Anaphylaxis  . Theophylline Swelling    VITALS:  Blood pressure 122/74, pulse (!) 107, temperature 98.2 F (36.8 C), temperature source Oral, resp. rate 16, height 5\' 5"  (1.651 m), weight 75.1 kg (165 lb 9.6 oz), SpO2 96 %.  PHYSICAL EXAMINATION:  GENERAL:  60 y.o.-year-old  patient lying in the bed with no acute distress.  EYES: Pupils equal, round, reactive to light and accommodation. No scleral icterus. Extraocular muscles intact.  HEENT: Head atraumatic, normocephalic. Oropharynx and nasopharynx clear.  NECK:  Supple, no jugular venous distention. No thyroid enlargement, no tenderness.  LUNGS: no wheezing.decreased breath sounds.. No use of accessory muscles of respiration.  CARDIOVASCULAR: S1, S2 normal. No murmurs, rubs, or gallops.  ABDOMEN: Soft, nontender, nondistended. Bowel sounds present. No organomegaly or mass.  EXTREMITIES: No pedal edema, cyanosis, or clubbing.  NEUROLOGIC: Cranial nerves II through XII are intact. Muscle strength 5/5 in all extremities. Sensation intact. Gait not checked.  PSYCHIATRIC: The patient is alert and oriented x 3.  SKIN: No obvious rash, lesion, or ulcer.    LABORATORY PANEL:   CBC  Recent Labs Lab 05/06/16 0410  WBC 6.1  HGB 11.0*  HCT 32.4*  PLT 215   ------------------------------------------------------------------------------------------------------------------  Chemistries   Recent Labs Lab 05/05/16 1632  05/08/16 0507  05/10/16 0433  NA 146*  < > 142  < > 144  K <2.0*  < > 6.0*  < > 3.8  CL 101  < > 108  < > 107  CO2 33*  < > 31  < > 31  GLUCOSE 115*  < > 104*  < > 111*  BUN 13  < > 7  < > 10  CREATININE 1.60*  < > 0.93  < > 0.97  CALCIUM 7.0*  < > 8.3*  < > 8.9  MG 0.5*  < > 2.0  --   --   AST 41  --   --   --   --  ALT 18  --   --   --   --   ALKPHOS 62  --   --   --   --   BILITOT 0.4  --   --   --   --   < > = values in this interval not displayed. ------------------------------------------------------------------------------------------------------------------  Cardiac Enzymes  Recent Labs Lab 05/05/16 1632  TROPONINI 0.06*   ------------------------------------------------------------------------------------------------------------------  RADIOLOGY:  No results  found.  EKG:   Orders placed or performed during the hospital encounter of 05/05/16  . ED EKG  . ED EKG    ASSESSMENT AND PLAN:   1.s/p fall with rhabdomyolysis;improving with IV hydration 2.severe hypokalemia/hypomagnesemia;improved, Hyperkalemia; improved.    3.h/o HIV ;on antivirals;  4.acute renal failure on CKD stage 3;improving,follwed be nephrology  5.deconditioning/fall;PT consulted,home health PT    (6..acute diarrhea;resolved.stoll cdiff is negative,check stool cultures.given imodium.stopped LInzess. D/w nurse,pt.  7.history of COPD now with the exacerbation: tachycarida due to albuterol;changed albuterol to xopenox,   Chest x-ray negative for pneumonia. Steroid gives him swelling of legs but no other complaints.continue iv solumedrol today likley d/c am to home with home PT  All the records are reviewed and case discussed with Care Management/Social Workerr. Management plans discussed with the patient, family and they are in agreement.  CODE STATUS:DNR  TOTAL TIME TAKING CARE OF THIS PATIENT: 35 minutes.   POSSIBLE D/C IN 1-3DAYS, DEPENDING ON CLINICAL CONDITION.   Katha Hamming M.D on 05/11/2016 at 4:30 PM  Between 7am to 6pm - Pager - [phone]  After 6pm go to [email or web address] - password [email or web address]  [facility]  Office  [phone]  CC: Primary care physician; [facility]   Note: This dictation was prepared with Dragon dictation along with smaller phrase technology. Any transcriptional errors that result from this process are unintentional.

## 2016-05-11 NOTE — Progress Notes (Signed)
MEDICATION RELATED CONSULT NOTE - Electrolytes   Pharmacy consulted to assist in electrolyte replacement  Allergies  Allergen Reactions  . Aspirin Anaphylaxis  . Bee Venom Anaphylaxis  . Penicillins Anaphylaxis and Other (See Comments)    Has patient had a PCN reaction causing immediate rash, facial/tongue/throat swelling, SOB or lightheadedness with hypotension: Yes Has patient had a PCN reaction causing severe rash involving mucus membranes or skin necrosis: No Has patient had a PCN reaction that required hospitalization No Has patient had a PCN reaction occurring within the last 10 years: Yes If all of the above answers are "NO", then may proceed with Cephalosporin use.  . Prednisone Anaphylaxis  . Sulfa Antibiotics Anaphylaxis  . Sulfasalazine Anaphylaxis  . Theophyllines Anaphylaxis  . Theophylline Swelling    Patient Measurements: Height: 5\' 5"  (165.1 cm) Weight: 165 lb 9.6 oz (75.1 kg) IBW/kg (Calculated) : 61.5  Vital Signs: Temp: 98.2 F (36.8 C) (10/03 1116) Temp Source: Oral (10/03 1116) BP: 122/74 (10/03 1116) Pulse Rate: 107 (10/03 1116) Intake/Output from previous day: 10/02 0701 - 10/03 0700 In: 960 [P.O.:960] Out: 1400 [Urine:1400] Intake/Output from this shift: Total I/O In: 240 [P.O.:240] Out: 350 [Urine:350]  Labs:  Recent Labs  05/08/16 1630 05/09/16 0417 05/10/16 0433  CREATININE 1.04 0.97 0.97   Estimated Creatinine Clearance: 77.6 mL/min (by C-G formula based on SCr of 0.97 mg/dL).   Microbiology: Recent Results (from the past 720 hour(s))  C difficile quick scan w PCR reflex     Status: None   Collection Time: 05/06/16  8:55 PM  Result Value Ref Range Status   C Diff antigen NEGATIVE NEGATIVE Final   C Diff toxin NEGATIVE NEGATIVE Final   C Diff interpretation No C. difficile detected.  Final    Medications:  Scheduled:  . abacavir-dolutegravir-lamiVUDine  1 tablet Oral Daily  . calcium-vitamin D  1 tablet Oral BID  .  enoxaparin (LOVENOX) injection  40 mg Subcutaneous Q24H  . fluconazole  200 mg Oral Daily  . fluticasone  1 spray Each Nare Daily  . ipratropium-albuterol  3 mL Nebulization Q6H  . linaclotide  290 mcg Oral QAC breakfast  . loratadine  10 mg Oral Daily  . methimazole  10 mg Oral Daily  . methylPREDNISolone (SOLU-MEDROL) injection  60 mg Intravenous QAC breakfast  . mirtazapine  15 mg Oral QHS  . mometasone-formoterol  2 puff Inhalation BID  . multivitamin with minerals  1 tablet Oral Daily  . pantoprazole  40 mg Oral Daily  . sodium chloride flush  3 mL Intravenous Q12H  . tiotropium  18 mcg Inhalation Daily    Assessment: Pharmacy consulted for electrolyte replacement for 60 yo male with multiple electrolyte disturbances. Patient with hyperkalemia on 9/30.   Plan:  10/2 am labs WNL: K = 3.8; Mag on 9/30 = 2.0 Will obtain bmp and mag with am labs  Pharmacy will continue to monitor and adjust per consult.    Horris LatinoHolly Gilliam, PharmD Pharmacy Resident 05/11/2016 1:34 PM

## 2016-05-11 NOTE — Care Management (Signed)
Patient had increase in heart rate up to 150's during ambulation with physical therapy.  Patient is currently receiving duonebs q 4h.  Discussed during progression the possibility of the duoneb causing the increase in heart rate.  Continue to anticipate discharge home with home health.  Again- per patient's sister Junious DresserConnie- there is no plan for patient to discharge to her home.  Patient will discharge back to his boarding house.  he is wheezing this morning.  02 sats are stable and no indication at present for home 02

## 2016-05-11 NOTE — Progress Notes (Signed)
Patients HR increased to 140s-150s with ambulation with PT. Patient asymptomatic at the time. Dr. Luberta MutterKonidena rounding and made aware, to place orders.

## 2016-05-12 LAB — CBC
HEMATOCRIT: 32.4 % — AB (ref 40.0–52.0)
Hemoglobin: 11.1 g/dL — ABNORMAL LOW (ref 13.0–18.0)
MCH: 33.9 pg (ref 26.0–34.0)
MCHC: 34.3 g/dL (ref 32.0–36.0)
MCV: 98.7 fL (ref 80.0–100.0)
PLATELETS: 253 10*3/uL (ref 150–440)
RBC: 3.28 MIL/uL — ABNORMAL LOW (ref 4.40–5.90)
RDW: 15.8 % — AB (ref 11.5–14.5)
WBC: 8.9 10*3/uL (ref 3.8–10.6)

## 2016-05-12 LAB — BASIC METABOLIC PANEL
Anion gap: 6 (ref 5–15)
BUN: 18 mg/dL (ref 6–20)
CALCIUM: 8.4 mg/dL — AB (ref 8.9–10.3)
CO2: 28 mmol/L (ref 22–32)
Chloride: 112 mmol/L — ABNORMAL HIGH (ref 101–111)
Creatinine, Ser: 1 mg/dL (ref 0.61–1.24)
GFR calc Af Amer: >60 mL/min (ref >60)
GLUCOSE: 125 mg/dL — AB (ref 65–99)
Potassium: 3.3 mmol/L — ABNORMAL LOW (ref 3.5–5.1)
SODIUM: 146 mmol/L — AB (ref 135–145)

## 2016-05-12 LAB — MAGNESIUM: MAGNESIUM: 1.4 mg/dL — AB (ref 1.7–2.4)

## 2016-05-12 MED ORDER — MAGNESIUM SULFATE 4 GM/100ML IV SOLN
4.0000 g | Freq: Once | INTRAVENOUS | Status: AC
  Administered 2016-05-12: 4 g via INTRAVENOUS
  Filled 2016-05-12: qty 100

## 2016-05-12 MED ORDER — MAGNESIUM SULFATE 2 GM/50ML IV SOLN: 2.0000 g | Freq: Once | INTRAVENOUS | Status: DC

## 2016-05-12 MED ORDER — LEVALBUTEROL TARTRATE 45 MCG/ACT IN AERO: 1.0000 | INHALATION_SPRAY | Freq: Three times a day (TID) | RESPIRATORY_TRACT | 1 refills | Status: DC

## 2016-05-12 MED ORDER — POTASSIUM CHLORIDE CRYS ER 20 MEQ PO TBCR
20.0000 meq | EXTENDED_RELEASE_TABLET | Freq: Once | ORAL | Status: AC
  Administered 2016-05-12: 20 meq via ORAL
  Filled 2016-05-12: qty 1

## 2016-05-12 MED ORDER — PREDNISONE 10 MG (21) PO TBPK: 10.0000 mg | ORAL_TABLET | Freq: Every day | ORAL | 0 refills | Status: DC

## 2016-05-12 NOTE — Care Management (Signed)
Patient for discharge home today. Notified [facility].  Requested the home health orders from attending.  Patient will be seen by agency 10.5.17

## 2016-05-12 NOTE — Progress Notes (Signed)
[facility] - [place] [facility]   PATIENT NAME: Brian Hampton    MR#:  [id number]  DATE OF BIRTH:  1955/12/21  SUBJECTIVE: is doing well. Stable  For discharge.   CHIEF COMPLAINT:   Chief Complaint  Patient presents with  . Weakness    REVIEW OF SYSTEMS:   ROS CONSTITUTIONAL: No fever, fatigue or weakness.  EYES: No blurred or double vision.  EARS, NOSE, AND THROAT: No tinnitus or ear pain.  RESPIRATORY: Cough, shortness of breath, pleuritic chest pain. CARDIOVASCULAR: No chest pain, orthopnea, edema.  GASTROINTESTINAL: No nausea, vomiting, diarrhea or abdominal pain.  GENITOURINARY: No dysuria, hematuria.  ENDOCRINE: No polyuria, nocturia,  HEMATOLOGY: No anemia, easy bruising or bleeding SKIN: No rash or lesion. MUSCULOSKELETAL: No joint pain or arthritis.   NEUROLOGIC: No tingling, numbness, weakness.  PSYCHIATRY: No anxiety or depression.   DRUG ALLERGIES:   Allergies  Allergen Reactions  . Aspirin Anaphylaxis  . Bee Venom Anaphylaxis  . Penicillins Anaphylaxis and Other (See Comments)    Has patient had a PCN reaction causing immediate rash, facial/tongue/throat swelling, SOB or lightheadedness with hypotension: Yes Has patient had a PCN reaction causing severe rash involving mucus membranes or skin necrosis: No Has patient had a PCN reaction that required hospitalization No Has patient had a PCN reaction occurring within the last 10 years: Yes If all of the above answers are "NO", then may proceed with Cephalosporin use.  . Prednisone Anaphylaxis  . Sulfa Antibiotics Anaphylaxis  . Sulfasalazine Anaphylaxis  . Theophyllines Anaphylaxis  . Theophylline Swelling    VITALS:  Blood pressure 122/78, pulse (!) 101, temperature 98.2 F (36.8 C), temperature source Oral, resp. rate 16, height 5\' 5"  (1.651 m), weight 75.1 kg (165 lb 9.6 oz), SpO2 98 %.  PHYSICAL EXAMINATION:  GENERAL:  60 y.o.-year-old patient lying in the bed with no  acute distress.  EYES: Pupils equal, round, reactive to light and accommodation. No scleral icterus. Extraocular muscles intact.  HEENT: Head atraumatic, normocephalic. Oropharynx and nasopharynx clear.  NECK:  Supple, no jugular venous distention. No thyroid enlargement, no tenderness.  LUNGS: no wheezing.decreased breath sounds.. No use of accessory muscles of respiration.  CARDIOVASCULAR: S1, S2 normal. No murmurs, rubs, or gallops.  ABDOMEN: Soft, nontender, nondistended. Bowel sounds present. No organomegaly or mass.  EXTREMITIES: No pedal edema, cyanosis, or clubbing.  NEUROLOGIC: Cranial nerves II through XII are intact. Muscle strength 5/5 in all extremities. Sensation intact. Gait not checked.  PSYCHIATRIC: The patient is alert and oriented x 3.  SKIN: No obvious rash, lesion, or ulcer.    LABORATORY PANEL:   CBC  Recent Labs Lab 05/12/16 0352  WBC 8.9  HGB 11.1*  HCT 32.4*  PLT 253   ------------------------------------------------------------------------------------------------------------------  Chemistries   Recent Labs Lab 05/05/16 1632  05/12/16 0352  NA 146*  < > 146*  K <2.0*  < > 3.3*  CL 101  < > 112*  CO2 33*  < > 28  GLUCOSE 115*  < > 125*  BUN 13  < > 18  CREATININE 1.60*  < > 1.00  CALCIUM 7.0*  < > 8.4*  MG 0.5*  < > 1.4*  AST 41  --   --   ALT 18  --   --   ALKPHOS 62  --   --   BILITOT 0.4  --   --   < > = values in this interval not displayed. ------------------------------------------------------------------------------------------------------------------  Cardiac Enzymes  Recent  Labs Lab 05/05/16 1632  TROPONINI 0.06*   ------------------------------------------------------------------------------------------------------------------  RADIOLOGY:  No results found.  EKG:   Orders placed or performed during the hospital encounter of 05/05/16  . ED EKG  . ED EKG    ASSESSMENT AND PLAN:   1.s/p fall with  rhabdomyolysis;improving with IV hydration 2.severe hypokalemia/hypomagnesemia;improved, Hyperkalemia; improved.    3.h/o HIV ;on antivirals;  4.acute renal failure on CKD stage 3;improving,follwed be nephrology  5.deconditioning/fall;PT consulted,home health PT    (6..acute diarrhea;resolved.stoll cdiff is negative,check stool cultures.given imodium.stopped LInzess. D/w nurse,pt.  7.history of COPD now with the exacerbation: Gentleman with tapering course of prednisone, Xopenex inhaler. #8 history of for DVT, failed oral agents: He is on Lovenox, discharging home with Lovenox.  Discharge home  Today with home health physical therapy,  All the records are reviewed and case discussed with Care Management/Social Workerr. Management plans discussed with the patient, family and they are in agreement.  CODE STATUS:DNR  TOTAL TIME TAKING CARE OF THIS PATIENT: 35 minutes.   POSSIBLE D/C IN 1-3DAYS, DEPENDING ON CLINICAL CONDITION.   Katha HammingKONIDENA,Thuy Atilano M.D on 05/12/2016 at 1:46 PM  Between 7am to 6pm - Pager - [phone]  After 6pm go to [email or web address] - password [email or web address]  [facility] [place] [facility]  Office  [phone]  CC: Primary care physician; [facility]   Note: This dictation was prepared with Dragon dictation along with smaller phrase technology. Any transcriptional errors that result from this process are unintentional.

## 2016-05-12 NOTE — Progress Notes (Signed)
Physical Therapy Treatment Patient Details Name: Brian Hampton MRN: [id number] DOB: 11/06/1955 Today's Date: 05/12/2016    History of Present Illness Brian Hampton is a 60yo white male [facility] weakness/[facility], found to have low K+, and low Mg upon arrival and rhabdmyolysis. PMH: asthma, CPOD, emphysema, HIV, HTN, DM, MI, CAD, and is s/p IVC placement. P comes from a boarding house room on second floor but now has plans to DC and live with sister in a handicapped accessible mobile home.  PLOF inclused limited community distances with a QC.     PT Comments    Pt agrees to session.  Participated in exercises as described below.  He was able to stand and ambulate 150' with walker and min guard with overall steady gait.  HR was monitored during session.  100's at rest, increased to 110's with exercises, 128 with gait and returned to 104 with supine rest.     Follow Up Recommendations  Home health PT     Equipment Recommendations  None recommended by PT    Recommendations for Other Services       Precautions / Restrictions Precautions Precautions: None Restrictions Weight Bearing Restrictions: No    Mobility  Bed Mobility Overal bed mobility: Modified Independent                Transfers Overall transfer level: Modified independent Equipment used: Rolling walker (2 wheeled)                Ambulation/Gait Ambulation/Gait assistance: Min guard Ambulation Distance (Feet): 150 Feet Assistive device: Rolling walker (2 wheeled) Gait Pattern/deviations: Step-through pattern   Gait velocity interpretation: <1.8 ft/sec, indicative of risk for recurrent falls General Gait Details: slow self selected cadence   Stairs            Wheelchair Mobility    Modified Rankin (Stroke Patients Only)       Balance                                    Cognition Arousal/Alertness: Awake/alert Behavior During Therapy: WFL for tasks  assessed/performed Overall Cognitive Status: Within Functional Limits for tasks assessed                      Exercises Other Exercises Other Exercises: supine heel slides, ab/ad, slr, quad sets and ankle pumps x 10 bilaterally Other Exercises: seated LAQ and marches x 10 bilaterally    General Comments        Pertinent Vitals/Pain Pain Assessment: No/denies pain    Home Living                      Prior Function            PT Goals (current goals can now be found in the care plan section) Acute Rehab PT Goals Patient Stated Goal: improve strength for functional mobility.  Progress towards PT goals: Progressing toward goals    Frequency    Min 2X/week      PT Plan Current plan remains appropriate    Co-evaluation             End of Session Equipment Utilized During Treatment: Gait belt Activity Tolerance: Patient tolerated treatment well Patient left: in bed;with call bell/phone within reach;with bed alarm set     Time: [id number] PT Time Calculation (min) (ACUTE ONLY): 13 min  Charges:  $Therapeutic Exercise: 8-22 mins                    G Codes:      Brian Hampton 05-21-16, 10:36 AM

## 2016-05-12 NOTE — Progress Notes (Signed)
MEDICATION RELATED CONSULT NOTE - Electrolytes   Pharmacy consulted to assist in electrolyte replacement  Allergies  Allergen Reactions  . Aspirin Anaphylaxis  . Bee Venom Anaphylaxis  . Penicillins Anaphylaxis and Other (See Comments)    Has patient had a PCN reaction causing immediate rash, facial/tongue/throat swelling, SOB or lightheadedness with hypotension: Yes Has patient had a PCN reaction causing severe rash involving mucus membranes or skin necrosis: No Has patient had a PCN reaction that required hospitalization No Has patient had a PCN reaction occurring within the last 10 years: Yes If all of the above answers are "NO", then may proceed with Cephalosporin use.  . Prednisone Anaphylaxis  . Sulfa Antibiotics Anaphylaxis  . Sulfasalazine Anaphylaxis  . Theophyllines Anaphylaxis  . Theophylline Swelling    Patient Measurements: Height: 5\' 5"  (165.1 cm) Weight: 165 lb 9.6 oz (75.1 kg) IBW/kg (Calculated) : 61.5  Vital Signs: Temp: 98.2 F (36.8 C) (10/04 1127) Temp Source: Oral (10/04 1127) BP: 122/78 (10/04 1127) Pulse Rate: 101 (10/04 1127) Intake/Output from previous day: 10/03 0701 - 10/04 0700 In: 480 [P.O.:480] Out: 650 [Urine:650] Intake/Output from this shift: Total I/O In: 240 [P.O.:240] Out: 300 [Urine:300]  Labs:  Recent Labs  05/10/16 0433 05/12/16 0352  WBC  --  8.9  HGB  --  11.1*  HCT  --  32.4*  PLT  --  253  CREATININE 0.97 1.00  MG  --  1.4*   Estimated Creatinine Clearance: 75.3 mL/min (by C-G formula based on SCr of 1 mg/dL).   Microbiology: Recent Results (from the past 720 hour(s))  C difficile quick scan w PCR reflex     Status: None   Collection Time: 05/06/16  8:55 PM  Result Value Ref Range Status   C Diff antigen NEGATIVE NEGATIVE Final   C Diff toxin NEGATIVE NEGATIVE Final   C Diff interpretation No C. difficile detected.  Final    Medications:  Scheduled:  . abacavir-dolutegravir-lamiVUDine  1 tablet Oral Daily   . calcium-vitamin D  1 tablet Oral BID  . enoxaparin (LOVENOX) injection  40 mg Subcutaneous Q24H  . fluticasone  1 spray Each Nare Daily  . levalbuterol  1.25 mg Nebulization Q6H  . linaclotide  290 mcg Oral QAC breakfast  . loratadine  10 mg Oral Daily  . magnesium sulfate 1 - 4 g bolus IVPB  4 g Intravenous Once  . methimazole  10 mg Oral Daily  . methylPREDNISolone (SOLU-MEDROL) injection  60 mg Intravenous QAC breakfast  . mirtazapine  15 mg Oral QHS  . mometasone-formoterol  2 puff Inhalation BID  . multivitamin with minerals  1 tablet Oral Daily  . pantoprazole  40 mg Oral Daily  . sodium chloride flush  3 mL Intravenous Q12H  . tiotropium  18 mcg Inhalation Daily    Assessment: Pharmacy consulted for electrolyte replacement for 60 yo male with multiple electrolyte disturbances. Patient with hyperkalemia on 9/30.   Plan:  10/4 am labs K = 3.3; mag = 1.4 - Patient received KCl 20 mEq PO x 1 and magnesium 4g IV x 1. 10/2 am labs WNL: K = 3.8; Mag on 9/30 = 2.0  Pharmacy will continue to monitor and adjust per consult.    Brian LatinoHolly Krystel Fletchall, PharmD Pharmacy Resident 05/12/2016 2:33 PM

## 2016-05-12 NOTE — Progress Notes (Signed)
Pt.  Discharge home with discharge instruction with teachback. Pt. Denies c/o pain or discomfort. IV access removed without difficulty

## 2016-05-14 DIAGNOSIS — E876 Hypokalemia: Secondary | ICD-10-CM | POA: Diagnosis not present

## 2016-05-14 DIAGNOSIS — I2699 Other pulmonary embolism without acute cor pulmonale: Secondary | ICD-10-CM | POA: Diagnosis not present

## 2016-05-14 DIAGNOSIS — S51001D Unspecified open wound of right elbow, subsequent encounter: Secondary | ICD-10-CM | POA: Diagnosis not present

## 2016-05-14 DIAGNOSIS — L03113 Cellulitis of right upper limb: Secondary | ICD-10-CM | POA: Diagnosis not present

## 2016-05-14 DIAGNOSIS — I2510 Atherosclerotic heart disease of native coronary artery without angina pectoris: Secondary | ICD-10-CM | POA: Diagnosis not present

## 2016-05-14 DIAGNOSIS — I10 Essential (primary) hypertension: Secondary | ICD-10-CM | POA: Diagnosis not present

## 2016-05-14 DIAGNOSIS — K219 Gastro-esophageal reflux disease without esophagitis: Secondary | ICD-10-CM | POA: Diagnosis not present

## 2016-05-14 DIAGNOSIS — J449 Chronic obstructive pulmonary disease, unspecified: Secondary | ICD-10-CM | POA: Diagnosis not present

## 2016-05-14 DIAGNOSIS — E119 Type 2 diabetes mellitus without complications: Secondary | ICD-10-CM | POA: Diagnosis not present

## 2016-05-14 DIAGNOSIS — M79604 Pain in right leg: Secondary | ICD-10-CM | POA: Diagnosis not present

## 2016-05-14 DIAGNOSIS — E785 Hyperlipidemia, unspecified: Secondary | ICD-10-CM | POA: Diagnosis not present

## 2016-05-14 NOTE — Discharge Summary (Signed)
Brian Hampton, is a 60 y.o. male  DOB June 24, 1956  MRN [id number].  Admission date:  05/05/2016  Admitting Physician  Altamese Dilling, MD  Discharge Date:  05/12/2016   Primary MD  [facility]  Recommendations for primary care physician for things to follow:  Follow-up with primary doctor in 1 week   Admission Diagnosis  Hypokalemia [E87.6] Hypomagnesemia [E83.42] Elevated CK [R74.8] Generalized weakness [R53.1] AKI (acute kidney injury) (HCC) [N17.9]   Discharge Diagnosis  Hypokalemia [E87.6] Hypomagnesemia [E83.42] Elevated CK [R74.8] Generalized weakness [R53.1] AKI (acute kidney injury) (HCC) [N17.9]   Principal Problem:   Hypokalemia Active Problems:   Hypomagnesemia   Rhabdomyolysis      Past Medical History:  Diagnosis Date  . Anemia   . Asthma   . Collagen vascular disease (HCC)   . COPD (chronic obstructive pulmonary disease) (HCC)   . Coronary artery disease   . DVT (deep venous thrombosis) (HCC)   . Emphysema/COPD (HCC)   . HIV (human immunodeficiency virus infection) (HCC)   . Hypertension   . Lung mass   . Myocardial infarction    in 2000  . Type 2 diabetes mellitus (HCC)     Past Surgical History:  Procedure Laterality Date  . PERIPHERAL VASCULAR CATHETERIZATION N/A 03/16/2016   Procedure: IVC Filter Insertion;  Surgeon: Renford Dills, MD;  Location: [facility] INVASIVE CV LAB;  Service: Cardiovascular;  Laterality: N/A;       History of present illness and  Hospital Course:     Kindly see H&P for history of present illness and admission details, please review complete Labs, Consult reports and Test reports for all details in brief  HPI  from the history and physical done on the day of admission  60 year old male patient with history of HIV, COPD, chronic kidney  disease stage III admitted secondary to generalized weakness, fall, rhabdomyolysis, severe hypokalemia hypomagnesemia.  Hospital Course  #1 status post fall with rhabdomyolysis: Admitted because of that, started on IV fluids. Physical therapy recommended home health physical therapy. #2 severe hypokalemia, hypomagnesemia due to diuretics. Patient received IV hydration, this was replacement of potassium, magnesium. Monitored on telemetry. Patient did not have any arrhythmias. Generalized weakness improved off the potassium being replaced. Discontinue the diuretics. Seen by nephrology. Patient to the diuretics the are on hold at discharge. Patient can follow up with the nephrology Dr. Mady Haagensen . As  an outpatient and restart the diuretics as indicated./  #3, acute renal failure improved with IV hydration.CR 1  at discharge . 4.COPD exacerbation: Patient received nebulizers, IV steroids. Discharging her home with the prednisone Dosepak, Lovenox inhaler. Patient the developed tachycardia with albuterol nebulizer so we changed xopenoxnebulizers while in the hospital. It is allergic to steroids so I discussed this with him he said he feels nausea only with IV steroids, but he tolerated the IV steroids and Hospital andtold nausea is a minor side effect but benefits of steroids with the wheezing is more he agreed for this. 5, history of HIV: Continue home medication.  Discharge Condition: stable   Follow UP  Follow-up Information    [facility] Follow up in 1 week(s).   Specialty:  Pulmonary Disease Why:  Thursday, October 12th at 10am, ccs Contact information: [address] [place] [address] [phone]        Mady Haagensen, MD Follow up in 2 week(s).   Specialty:  Internal Medicine Contact information: [id number]  [place] [address] [phone]             Discharge Instructions  and  Discharge Medications     Discharge Instructions     Face-to-face encounter (required for Medicare/Medicaid patients)    Complete by:  As directed    I Pranav Lince certify that this patient is under my care and that I, or a nurse practitioner or physician's assistant working with me, had a face-to-face encounter that meets the physician face-to-face encounter requirements with this patient on 05/12/2016. The encounter with the patient was in whole, or in part for the following medical condition(s) which is the primary reason for home health care  Rhabdomyolysis, HIV, deconditioning, hypo-and hyperkalemia, history of DVT, COPD, hypertension   The encounter with the patient was in whole, or in part, for the following medical condition, which is the primary reason for home health care:  whole   I certify that, based on my findings, the following services are medically necessary home health services:   Physical therapy Nursing     Reason for Medically Necessary Home Health Services:  Therapy- Investment banker, operational, Patent examiner   My clinical findings support the need for the above services:  Unable to leave home safely without assistance and/or assistive device   Further, I certify that my clinical findings support that this patient is homebound due to:  Unable to leave home safely without assistance   Home Health    Complete by:  As directed    To provide the following care/treatments:   PT RN OT Home Health Aide Social work         Medication List    STOP taking these medications   furosemide 20 MG tablet Commonly known as:  LASIX     TAKE these medications   abacavir-dolutegravir-lamiVUDine 600-50-300 MG tablet Commonly known as:  TRIUMEQ Take 1 tablet by mouth every morning. Reported on 01/30/2016   albuterol 108 (90 Base) MCG/ACT inhaler Commonly known as:  PROVENTIL HFA;VENTOLIN HFA Inhale 2 puffs into the lungs every 6 (six) hours as needed for wheezing or shortness of breath.   albuterol (2.5 MG/3ML)  0.083% nebulizer solution Commonly known as:  PROVENTIL Take 2.5 mg by nebulization every 6 (six) hours as needed for wheezing or shortness of breath.   atorvastatin 40 MG tablet Commonly known as:  LIPITOR Take 40 mg by mouth at bedtime.   CALCIUM 600+D 600-400 MG-UNIT tablet Generic drug:  Calcium Carbonate-Vitamin D Take 1 tablet by mouth 2 (two) times daily.   carvedilol 12.5 MG tablet Commonly known as:  COREG Take 12.5 mg by mouth 2 (two) times daily with a meal.   cetirizine 10 MG tablet Commonly known as:  ZYRTEC Take 10 mg by mouth every morning.   clonazePAM 0.5 MG tablet Commonly known as:  KLONOPIN Take 1 tablet (0.5 mg total) by mouth 2 (two) times daily as needed for anxiety.   DULERA 200-5 MCG/ACT Aero Generic drug:  mometasone-formoterol Inhale 2 puffs into the lungs 2 (two) times daily.   enoxaparin 150 MG/ML injection Commonly known as:  LOVENOX Inject 0.44 mLs (65 mg total) into the skin every 12 (twelve) hours.   EPIPEN 2-PAK 0.3 mg/0.3 mL Soaj injection Generic drug:  EPINEPHrine Inject 0.3 mg into the muscle once as needed (for severe allergic reaction).   escitalopram 20 MG tablet Commonly known as:  LEXAPRO Take 20 mg by mouth every morning.   fluticasone 50 MCG/ACT nasal spray Commonly known  as:  FLONASE Place 1 spray into both nostrils every morning.   gemfibrozil 600 MG tablet Commonly known as:  LOPID Take 600 mg by mouth 2 (two) times daily before a meal.   isosorbide mononitrate 30 MG 24 hr tablet Commonly known as:  IMDUR Take 30 mg by mouth every morning.   levalbuterol 45 MCG/ACT inhaler Commonly known as:  XOPENEX HFA Inhale 1 puff into the lungs 3 (three) times daily.   LINZESS 290 MCG Caps capsule Generic drug:  linaclotide Take 290 mcg by mouth daily before breakfast.   meloxicam 7.5 MG tablet Commonly known as:  MOBIC Take 7.5 mg by mouth 2 (two) times daily.   methimazole 10 MG tablet Commonly known as:   TAPAZOLE Take 1 tablet (10 mg total) by mouth daily.   mirtazapine 15 MG tablet Commonly known as:  REMERON Take 15 mg by mouth at bedtime.   multivitamin with minerals Tabs tablet Take 1 tablet by mouth every morning.   nitroGLYCERIN 0.4 MG SL tablet Commonly known as:  NITROSTAT Place 0.4 mg under the tongue every 5 (five) minutes as needed for chest pain.   omeprazole 20 MG capsule Commonly known as:  PRILOSEC Take 20 mg by mouth 2 (two) times daily before a meal.   predniSONE 10 MG (21) Tbpk tablet Commonly known as:  STERAPRED UNI-PAK 21 TAB Take 1 tablet (10 mg total) by mouth daily. Taper by 10 mg po daily   senna-docusate 8.6-50 MG tablet Commonly known as:  Senokot-S Take 1 tablet by mouth at bedtime as needed for mild constipation.   tiotropium 18 MCG inhalation capsule Commonly known as:  SPIRIVA Place 18 mcg into inhaler and inhale every evening.   traMADol 50 MG tablet Commonly known as:  ULTRAM Take 1 tablet (50 mg total) by mouth every 6 (six) hours as needed.   zolpidem 12.5 MG CR tablet Commonly known as:  AMBIEN CR Take 1 tablet (12.5 mg total) by mouth at bedtime as needed for sleep.         Diet and Activity recommendation: See Discharge Instructions above   Consults obtained - nephrology   Major procedures and Radiology Reports - PLEASE review detailed and final reports for all details, in brief -      Dg Chest 1 View  Result Date: 05/09/2016 CLINICAL DATA:  Pt states he started wheezing last night and was having a hard time catching his breath EXAM: CHEST 1 VIEW COMPARISON:  05/05/2016 FINDINGS: The cardiac silhouette is normal in size and configuration. No mediastinal or hilar masses or evidence of adenopathy. Minor scarring at the left lateral lung base. Lungs are otherwise clear. No pleural effusion. No pneumothorax. Skeletal structures are demineralized but intact. IMPRESSION: No active disease. Electronically Signed   By: Amie Portland M.D.   On: 05/09/2016 11:55   Dg Chest 2 View  Result Date: 05/05/2016 CLINICAL DATA:  60 year old male with history of weakness and tingling all over. EXAM: CHEST  2 VIEW COMPARISON:  Chest x-ray 06/24/2015. FINDINGS: Lung volumes are low. No consolidative airspace disease. Mild blunting of both costophrenic sulci noted on the lateral projection, which appears to be chronic, likely related to mild pleuroparenchymal scarring in the lung bases. No pneumothorax. No pulmonary nodule or mass noted. Pulmonary vasculature and the cardiomediastinal silhouette are within normal limits. IVC filter noted on the lateral projection. IMPRESSION: 1. Low lung volumes without radiographic evidence of acute cardiopulmonary disease. Electronically Signed   By: Reuel Boom  Entrikin M.D.   On: 05/05/2016 19:05   Dg Lumbar Spine 2-3 Views  Result Date: 05/04/2016 CLINICAL DATA:  60 year old with acute onset of low back pain with and getting out of bed earlier today. Patient denies radiculopathy. No known injury. EXAM: LUMBAR SPINE - 2-3 VIEW COMPARISON:  Bone window images from CT abdomen and pelvis 01/31/2016. MRI lumbar spine 01/13/2016. FINDINGS: Five non-rib-bearing lumbar vertebrae with T12-small, rudimentary ribs. Anatomic alignment. No fractures. Mild disc space narrowing at L1-2 and L2-3. Moderate disc space narrowing at T11-12 and T12-L1. Bridging anterior osteophytes at L2-3, L3-4 and L4-5. Facet degenerative changes at L4-5 and L5-S1. No interval change. IVC filter with its apex at the mid L1 level, appropriately positioned. 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 No acute osseous abnormality. Electronically Signed   By: Hulan Saas M.D.   On: 05/04/2016 10:46    Micro Results    Recent Results (from the past 240 hour(s))  C difficile quick scan w PCR reflex     Status: None   Collection Time: 05/06/16  8:55 PM  Result Value Ref Range Status    C Diff antigen NEGATIVE NEGATIVE Final   C Diff toxin NEGATIVE NEGATIVE Final   C Diff interpretation No C. difficile detected.  Final       Today   Subjective:   Brian Hampton today has no headache,no chest abdominal pain,no new weakness tingling or numbness, feels much better wants to go home today.   Objective:   Blood pressure 122/78, pulse (!) 101, temperature 98.2 F (36.8 C), temperature source Oral, resp. rate 16, height 5\' 5"  (1.651 m), weight 75.1 kg (165 lb 9.6 oz), SpO2 98 %.  No intake or output data in the 24 hours ending 05/14/16 0854  Exam Awake Alert, Oriented x 3, No new F.N deficits, Normal affect [place].AT,PERRAL Supple Neck,No JVD, No cervical lymphadenopathy appriciated.  Symmetrical Chest wall movement, Good air movement bilaterally, CTAB RRR,No Gallops,Rubs or new Murmurs, No Parasternal Heave +ve B.Sounds, Abd Soft, Non tender, No organomegaly appriciated, No rebound -guarding or rigidity. No Cyanosis, Clubbing or edema, No new Rash or bruise  Data Review   CBC w Diff:  Lab Results  Component Value Date   WBC 8.9 05/12/2016   HGB 11.1 (L) 05/12/2016   HGB 13.1 10/02/2011   HCT 32.4 (L) 05/12/2016   HCT 35.3 (L) 01/14/2016   PLT 253 05/12/2016   PLT 215 01/14/2016   LYMPHOPCT 31 02/14/2016   BANDSPCT 2 01/21/2016   MONOPCT 13 02/14/2016   EOSPCT 1 02/14/2016   BASOPCT 1 02/14/2016    CMP:  Lab Results  Component Value Date   NA 146 (H) 05/12/2016   NA 145 10/02/2011   K 3.3 (L) 05/12/2016   K 3.9 10/02/2011   CL 112 (H) 05/12/2016   CL 111 (H) 10/02/2011   CO2 28 05/12/2016   CO2 22 10/02/2011   BUN 18 05/12/2016   BUN 16 10/02/2011   CREATININE 1.00 05/12/2016   CREATININE 0.97 10/02/2011   PROT 7.0 05/05/2016   PROT 8.2 10/02/2011   ALBUMIN 3.1 (L) 05/05/2016   ALBUMIN 4.2 10/02/2011   BILITOT 0.4 05/05/2016   BILITOT 0.3 10/02/2011   ALKPHOS 62 05/05/2016   ALKPHOS 187 (H) 10/02/2011   AST 41 05/05/2016   AST 28 10/02/2011    ALT 18 05/05/2016   ALT 19 10/02/2011  .   Total Time in preparing paper work, data evaluation and  todays exam - 35 minutes  Galit Urich M.D on 05/12/2016 at 8:54 AM    Note: This dictation was prepared with Dragon dictation along with smaller phrase technology. Any transcriptional errors that result from this process are unintentional.

## 2016-05-15 DIAGNOSIS — L03113 Cellulitis of right upper limb: Secondary | ICD-10-CM | POA: Diagnosis not present

## 2016-05-15 DIAGNOSIS — I2510 Atherosclerotic heart disease of native coronary artery without angina pectoris: Secondary | ICD-10-CM | POA: Diagnosis not present

## 2016-05-15 DIAGNOSIS — E119 Type 2 diabetes mellitus without complications: Secondary | ICD-10-CM | POA: Diagnosis not present

## 2016-05-15 DIAGNOSIS — M79604 Pain in right leg: Secondary | ICD-10-CM | POA: Diagnosis not present

## 2016-05-15 DIAGNOSIS — S51001D Unspecified open wound of right elbow, subsequent encounter: Secondary | ICD-10-CM | POA: Diagnosis not present

## 2016-05-15 DIAGNOSIS — J449 Chronic obstructive pulmonary disease, unspecified: Secondary | ICD-10-CM | POA: Diagnosis not present

## 2016-05-19 DIAGNOSIS — S51001D Unspecified open wound of right elbow, subsequent encounter: Secondary | ICD-10-CM | POA: Diagnosis not present

## 2016-05-19 DIAGNOSIS — E119 Type 2 diabetes mellitus without complications: Secondary | ICD-10-CM | POA: Diagnosis not present

## 2016-05-19 DIAGNOSIS — M79604 Pain in right leg: Secondary | ICD-10-CM | POA: Diagnosis not present

## 2016-05-19 DIAGNOSIS — I2510 Atherosclerotic heart disease of native coronary artery without angina pectoris: Secondary | ICD-10-CM | POA: Diagnosis not present

## 2016-05-19 DIAGNOSIS — L03113 Cellulitis of right upper limb: Secondary | ICD-10-CM | POA: Diagnosis not present

## 2016-05-19 DIAGNOSIS — J449 Chronic obstructive pulmonary disease, unspecified: Secondary | ICD-10-CM | POA: Diagnosis not present

## 2016-05-20 ENCOUNTER — Other Ambulatory Visit: Payer: Self-pay | Admitting: Internal Medicine

## 2016-05-20 DIAGNOSIS — J449 Chronic obstructive pulmonary disease, unspecified: Secondary | ICD-10-CM | POA: Diagnosis not present

## 2016-05-20 DIAGNOSIS — F411 Generalized anxiety disorder: Secondary | ICD-10-CM | POA: Diagnosis not present

## 2016-05-20 DIAGNOSIS — I10 Essential (primary) hypertension: Secondary | ICD-10-CM | POA: Diagnosis not present

## 2016-05-20 DIAGNOSIS — E119 Type 2 diabetes mellitus without complications: Secondary | ICD-10-CM | POA: Diagnosis not present

## 2016-05-20 DIAGNOSIS — I2510 Atherosclerotic heart disease of native coronary artery without angina pectoris: Secondary | ICD-10-CM | POA: Diagnosis not present

## 2016-05-20 DIAGNOSIS — L03113 Cellulitis of right upper limb: Secondary | ICD-10-CM | POA: Diagnosis not present

## 2016-05-20 DIAGNOSIS — E782 Mixed hyperlipidemia: Secondary | ICD-10-CM | POA: Diagnosis not present

## 2016-05-20 DIAGNOSIS — S51001D Unspecified open wound of right elbow, subsequent encounter: Secondary | ICD-10-CM | POA: Diagnosis not present

## 2016-05-20 DIAGNOSIS — I2699 Other pulmonary embolism without acute cor pulmonale: Secondary | ICD-10-CM | POA: Diagnosis not present

## 2016-05-20 DIAGNOSIS — M79604 Pain in right leg: Secondary | ICD-10-CM | POA: Diagnosis not present

## 2016-05-20 DIAGNOSIS — M170 Bilateral primary osteoarthritis of knee: Secondary | ICD-10-CM | POA: Diagnosis not present

## 2016-05-21 DIAGNOSIS — I10 Essential (primary) hypertension: Secondary | ICD-10-CM | POA: Diagnosis not present

## 2016-05-25 DIAGNOSIS — E119 Type 2 diabetes mellitus without complications: Secondary | ICD-10-CM | POA: Diagnosis not present

## 2016-05-25 DIAGNOSIS — M79604 Pain in right leg: Secondary | ICD-10-CM | POA: Diagnosis not present

## 2016-05-25 DIAGNOSIS — I2510 Atherosclerotic heart disease of native coronary artery without angina pectoris: Secondary | ICD-10-CM | POA: Diagnosis not present

## 2016-05-25 DIAGNOSIS — J449 Chronic obstructive pulmonary disease, unspecified: Secondary | ICD-10-CM | POA: Diagnosis not present

## 2016-05-25 DIAGNOSIS — L03113 Cellulitis of right upper limb: Secondary | ICD-10-CM | POA: Diagnosis not present

## 2016-05-25 DIAGNOSIS — S51001D Unspecified open wound of right elbow, subsequent encounter: Secondary | ICD-10-CM | POA: Diagnosis not present

## 2016-05-26 DIAGNOSIS — I2510 Atherosclerotic heart disease of native coronary artery without angina pectoris: Secondary | ICD-10-CM | POA: Diagnosis not present

## 2016-05-26 DIAGNOSIS — J449 Chronic obstructive pulmonary disease, unspecified: Secondary | ICD-10-CM | POA: Diagnosis not present

## 2016-05-26 DIAGNOSIS — L03113 Cellulitis of right upper limb: Secondary | ICD-10-CM | POA: Diagnosis not present

## 2016-05-26 DIAGNOSIS — M79604 Pain in right leg: Secondary | ICD-10-CM | POA: Diagnosis not present

## 2016-05-26 DIAGNOSIS — E119 Type 2 diabetes mellitus without complications: Secondary | ICD-10-CM | POA: Diagnosis not present

## 2016-05-26 DIAGNOSIS — S51001D Unspecified open wound of right elbow, subsequent encounter: Secondary | ICD-10-CM | POA: Diagnosis not present

## 2016-05-27 DIAGNOSIS — I10 Essential (primary) hypertension: Secondary | ICD-10-CM | POA: Diagnosis not present

## 2016-05-27 DIAGNOSIS — M6281 Muscle weakness (generalized): Secondary | ICD-10-CM | POA: Diagnosis not present

## 2016-05-27 DIAGNOSIS — Z79899 Other long term (current) drug therapy: Secondary | ICD-10-CM | POA: Diagnosis not present

## 2016-05-27 DIAGNOSIS — F329 Major depressive disorder, single episode, unspecified: Secondary | ICD-10-CM | POA: Diagnosis not present

## 2016-05-27 DIAGNOSIS — I2699 Other pulmonary embolism without acute cor pulmonale: Secondary | ICD-10-CM | POA: Diagnosis not present

## 2016-05-28 DIAGNOSIS — E119 Type 2 diabetes mellitus without complications: Secondary | ICD-10-CM | POA: Diagnosis not present

## 2016-05-28 DIAGNOSIS — I2510 Atherosclerotic heart disease of native coronary artery without angina pectoris: Secondary | ICD-10-CM | POA: Diagnosis not present

## 2016-05-28 DIAGNOSIS — L03113 Cellulitis of right upper limb: Secondary | ICD-10-CM | POA: Diagnosis not present

## 2016-05-28 DIAGNOSIS — M79604 Pain in right leg: Secondary | ICD-10-CM | POA: Diagnosis not present

## 2016-05-28 DIAGNOSIS — S51001D Unspecified open wound of right elbow, subsequent encounter: Secondary | ICD-10-CM | POA: Diagnosis not present

## 2016-05-28 DIAGNOSIS — J449 Chronic obstructive pulmonary disease, unspecified: Secondary | ICD-10-CM | POA: Diagnosis not present

## 2016-05-31 DIAGNOSIS — E119 Type 2 diabetes mellitus without complications: Secondary | ICD-10-CM | POA: Diagnosis not present

## 2016-05-31 DIAGNOSIS — S51001D Unspecified open wound of right elbow, subsequent encounter: Secondary | ICD-10-CM | POA: Diagnosis not present

## 2016-05-31 DIAGNOSIS — J449 Chronic obstructive pulmonary disease, unspecified: Secondary | ICD-10-CM | POA: Diagnosis not present

## 2016-05-31 DIAGNOSIS — I2510 Atherosclerotic heart disease of native coronary artery without angina pectoris: Secondary | ICD-10-CM | POA: Diagnosis not present

## 2016-05-31 DIAGNOSIS — M79604 Pain in right leg: Secondary | ICD-10-CM | POA: Diagnosis not present

## 2016-05-31 DIAGNOSIS — L03113 Cellulitis of right upper limb: Secondary | ICD-10-CM | POA: Diagnosis not present

## 2016-06-01 DIAGNOSIS — I2699 Other pulmonary embolism without acute cor pulmonale: Secondary | ICD-10-CM | POA: Diagnosis not present

## 2016-06-01 DIAGNOSIS — I10 Essential (primary) hypertension: Secondary | ICD-10-CM | POA: Diagnosis not present

## 2016-06-01 DIAGNOSIS — Z79899 Other long term (current) drug therapy: Secondary | ICD-10-CM | POA: Diagnosis not present

## 2016-06-01 DIAGNOSIS — N182 Chronic kidney disease, stage 2 (mild): Secondary | ICD-10-CM | POA: Diagnosis not present

## 2016-06-01 DIAGNOSIS — F329 Major depressive disorder, single episode, unspecified: Secondary | ICD-10-CM | POA: Diagnosis not present

## 2016-06-02 DIAGNOSIS — J449 Chronic obstructive pulmonary disease, unspecified: Secondary | ICD-10-CM | POA: Diagnosis not present

## 2016-06-02 DIAGNOSIS — S51001D Unspecified open wound of right elbow, subsequent encounter: Secondary | ICD-10-CM | POA: Diagnosis not present

## 2016-06-02 DIAGNOSIS — M79604 Pain in right leg: Secondary | ICD-10-CM | POA: Diagnosis not present

## 2016-06-02 DIAGNOSIS — L03113 Cellulitis of right upper limb: Secondary | ICD-10-CM | POA: Diagnosis not present

## 2016-06-02 DIAGNOSIS — I2510 Atherosclerotic heart disease of native coronary artery without angina pectoris: Secondary | ICD-10-CM | POA: Diagnosis not present

## 2016-06-02 DIAGNOSIS — E119 Type 2 diabetes mellitus without complications: Secondary | ICD-10-CM | POA: Diagnosis not present

## 2016-06-04 DIAGNOSIS — I2699 Other pulmonary embolism without acute cor pulmonale: Secondary | ICD-10-CM | POA: Diagnosis not present

## 2016-06-04 DIAGNOSIS — S51001D Unspecified open wound of right elbow, subsequent encounter: Secondary | ICD-10-CM | POA: Diagnosis not present

## 2016-06-04 DIAGNOSIS — Z79899 Other long term (current) drug therapy: Secondary | ICD-10-CM | POA: Diagnosis not present

## 2016-06-04 DIAGNOSIS — J449 Chronic obstructive pulmonary disease, unspecified: Secondary | ICD-10-CM | POA: Diagnosis not present

## 2016-06-04 DIAGNOSIS — M79604 Pain in right leg: Secondary | ICD-10-CM | POA: Diagnosis not present

## 2016-06-04 DIAGNOSIS — I2510 Atherosclerotic heart disease of native coronary artery without angina pectoris: Secondary | ICD-10-CM | POA: Diagnosis not present

## 2016-06-04 DIAGNOSIS — L03113 Cellulitis of right upper limb: Secondary | ICD-10-CM | POA: Diagnosis not present

## 2016-06-04 DIAGNOSIS — E119 Type 2 diabetes mellitus without complications: Secondary | ICD-10-CM | POA: Diagnosis not present

## 2016-06-07 DIAGNOSIS — M79604 Pain in right leg: Secondary | ICD-10-CM | POA: Diagnosis not present

## 2016-06-07 DIAGNOSIS — L03113 Cellulitis of right upper limb: Secondary | ICD-10-CM | POA: Diagnosis not present

## 2016-06-07 DIAGNOSIS — J449 Chronic obstructive pulmonary disease, unspecified: Secondary | ICD-10-CM | POA: Diagnosis not present

## 2016-06-07 DIAGNOSIS — E119 Type 2 diabetes mellitus without complications: Secondary | ICD-10-CM | POA: Diagnosis not present

## 2016-06-07 DIAGNOSIS — I2510 Atherosclerotic heart disease of native coronary artery without angina pectoris: Secondary | ICD-10-CM | POA: Diagnosis not present

## 2016-06-07 DIAGNOSIS — S51001D Unspecified open wound of right elbow, subsequent encounter: Secondary | ICD-10-CM | POA: Diagnosis not present

## 2016-06-08 DIAGNOSIS — Z79899 Other long term (current) drug therapy: Secondary | ICD-10-CM | POA: Diagnosis not present

## 2016-06-08 DIAGNOSIS — E871 Hypo-osmolality and hyponatremia: Secondary | ICD-10-CM | POA: Diagnosis not present

## 2016-06-08 DIAGNOSIS — M170 Bilateral primary osteoarthritis of knee: Secondary | ICD-10-CM | POA: Diagnosis not present

## 2016-06-08 DIAGNOSIS — I2699 Other pulmonary embolism without acute cor pulmonale: Secondary | ICD-10-CM | POA: Diagnosis not present

## 2016-06-14 DIAGNOSIS — Z79899 Other long term (current) drug therapy: Secondary | ICD-10-CM | POA: Diagnosis not present

## 2016-06-14 DIAGNOSIS — I2699 Other pulmonary embolism without acute cor pulmonale: Secondary | ICD-10-CM | POA: Diagnosis not present

## 2016-06-16 DIAGNOSIS — I110 Hypertensive heart disease with heart failure: Secondary | ICD-10-CM | POA: Diagnosis not present

## 2016-06-16 DIAGNOSIS — I2510 Atherosclerotic heart disease of native coronary artery without angina pectoris: Secondary | ICD-10-CM | POA: Diagnosis not present

## 2016-06-16 DIAGNOSIS — F419 Anxiety disorder, unspecified: Secondary | ICD-10-CM | POA: Diagnosis not present

## 2016-06-16 DIAGNOSIS — J449 Chronic obstructive pulmonary disease, unspecified: Secondary | ICD-10-CM | POA: Diagnosis not present

## 2016-06-16 DIAGNOSIS — B20 Human immunodeficiency virus [HIV] disease: Secondary | ICD-10-CM | POA: Diagnosis not present

## 2016-06-16 DIAGNOSIS — I509 Heart failure, unspecified: Secondary | ICD-10-CM | POA: Diagnosis not present

## 2016-06-22 DIAGNOSIS — K5900 Constipation, unspecified: Secondary | ICD-10-CM | POA: Diagnosis not present

## 2016-06-22 DIAGNOSIS — F419 Anxiety disorder, unspecified: Secondary | ICD-10-CM | POA: Diagnosis not present

## 2016-06-22 DIAGNOSIS — J449 Chronic obstructive pulmonary disease, unspecified: Secondary | ICD-10-CM | POA: Diagnosis not present

## 2016-06-22 DIAGNOSIS — I509 Heart failure, unspecified: Secondary | ICD-10-CM | POA: Diagnosis not present

## 2016-06-22 DIAGNOSIS — I2699 Other pulmonary embolism without acute cor pulmonale: Secondary | ICD-10-CM | POA: Diagnosis not present

## 2016-06-22 DIAGNOSIS — I110 Hypertensive heart disease with heart failure: Secondary | ICD-10-CM | POA: Diagnosis not present

## 2016-06-22 DIAGNOSIS — I2510 Atherosclerotic heart disease of native coronary artery without angina pectoris: Secondary | ICD-10-CM | POA: Diagnosis not present

## 2016-06-22 DIAGNOSIS — B20 Human immunodeficiency virus [HIV] disease: Secondary | ICD-10-CM | POA: Diagnosis not present

## 2016-06-22 DIAGNOSIS — F411 Generalized anxiety disorder: Secondary | ICD-10-CM | POA: Diagnosis not present

## 2016-06-22 DIAGNOSIS — M170 Bilateral primary osteoarthritis of knee: Secondary | ICD-10-CM | POA: Diagnosis not present

## 2016-06-22 DIAGNOSIS — Z79899 Other long term (current) drug therapy: Secondary | ICD-10-CM | POA: Diagnosis not present

## 2016-06-24 DIAGNOSIS — I10 Essential (primary) hypertension: Secondary | ICD-10-CM | POA: Diagnosis not present

## 2016-06-25 DIAGNOSIS — F419 Anxiety disorder, unspecified: Secondary | ICD-10-CM | POA: Diagnosis not present

## 2016-06-25 DIAGNOSIS — I2510 Atherosclerotic heart disease of native coronary artery without angina pectoris: Secondary | ICD-10-CM | POA: Diagnosis not present

## 2016-06-25 DIAGNOSIS — J449 Chronic obstructive pulmonary disease, unspecified: Secondary | ICD-10-CM | POA: Diagnosis not present

## 2016-06-25 DIAGNOSIS — B20 Human immunodeficiency virus [HIV] disease: Secondary | ICD-10-CM | POA: Diagnosis not present

## 2016-06-25 DIAGNOSIS — I110 Hypertensive heart disease with heart failure: Secondary | ICD-10-CM | POA: Diagnosis not present

## 2016-06-25 DIAGNOSIS — I509 Heart failure, unspecified: Secondary | ICD-10-CM | POA: Diagnosis not present

## 2016-06-29 DIAGNOSIS — Z79899 Other long term (current) drug therapy: Secondary | ICD-10-CM | POA: Diagnosis not present

## 2016-06-29 DIAGNOSIS — I2699 Other pulmonary embolism without acute cor pulmonale: Secondary | ICD-10-CM | POA: Diagnosis not present

## 2016-06-30 DIAGNOSIS — J449 Chronic obstructive pulmonary disease, unspecified: Secondary | ICD-10-CM | POA: Diagnosis not present

## 2016-06-30 DIAGNOSIS — B20 Human immunodeficiency virus [HIV] disease: Secondary | ICD-10-CM | POA: Diagnosis not present

## 2016-06-30 DIAGNOSIS — F419 Anxiety disorder, unspecified: Secondary | ICD-10-CM | POA: Diagnosis not present

## 2016-06-30 DIAGNOSIS — I509 Heart failure, unspecified: Secondary | ICD-10-CM | POA: Diagnosis not present

## 2016-06-30 DIAGNOSIS — I110 Hypertensive heart disease with heart failure: Secondary | ICD-10-CM | POA: Diagnosis not present

## 2016-06-30 DIAGNOSIS — I2510 Atherosclerotic heart disease of native coronary artery without angina pectoris: Secondary | ICD-10-CM | POA: Diagnosis not present

## 2016-07-06 DIAGNOSIS — Z79899 Other long term (current) drug therapy: Secondary | ICD-10-CM | POA: Diagnosis not present

## 2016-07-06 DIAGNOSIS — I2699 Other pulmonary embolism without acute cor pulmonale: Secondary | ICD-10-CM | POA: Diagnosis not present

## 2016-07-08 DIAGNOSIS — I2510 Atherosclerotic heart disease of native coronary artery without angina pectoris: Secondary | ICD-10-CM | POA: Diagnosis not present

## 2016-07-08 DIAGNOSIS — I509 Heart failure, unspecified: Secondary | ICD-10-CM | POA: Diagnosis not present

## 2016-07-08 DIAGNOSIS — I110 Hypertensive heart disease with heart failure: Secondary | ICD-10-CM | POA: Diagnosis not present

## 2016-07-08 DIAGNOSIS — F419 Anxiety disorder, unspecified: Secondary | ICD-10-CM | POA: Diagnosis not present

## 2016-07-08 DIAGNOSIS — J449 Chronic obstructive pulmonary disease, unspecified: Secondary | ICD-10-CM | POA: Diagnosis not present

## 2016-07-08 DIAGNOSIS — B20 Human immunodeficiency virus [HIV] disease: Secondary | ICD-10-CM | POA: Diagnosis not present

## 2016-07-13 DIAGNOSIS — I2699 Other pulmonary embolism without acute cor pulmonale: Secondary | ICD-10-CM | POA: Diagnosis not present

## 2016-07-13 DIAGNOSIS — Z79899 Other long term (current) drug therapy: Secondary | ICD-10-CM | POA: Diagnosis not present

## 2016-07-16 DIAGNOSIS — I2510 Atherosclerotic heart disease of native coronary artery without angina pectoris: Secondary | ICD-10-CM | POA: Diagnosis not present

## 2016-07-16 DIAGNOSIS — I509 Heart failure, unspecified: Secondary | ICD-10-CM | POA: Diagnosis not present

## 2016-07-16 DIAGNOSIS — B20 Human immunodeficiency virus [HIV] disease: Secondary | ICD-10-CM | POA: Diagnosis not present

## 2016-07-16 DIAGNOSIS — I110 Hypertensive heart disease with heart failure: Secondary | ICD-10-CM | POA: Diagnosis not present

## 2016-07-16 DIAGNOSIS — F419 Anxiety disorder, unspecified: Secondary | ICD-10-CM | POA: Diagnosis not present

## 2016-07-16 DIAGNOSIS — J449 Chronic obstructive pulmonary disease, unspecified: Secondary | ICD-10-CM | POA: Diagnosis not present

## 2016-07-20 DIAGNOSIS — Z79899 Other long term (current) drug therapy: Secondary | ICD-10-CM | POA: Diagnosis not present

## 2016-07-20 DIAGNOSIS — I2699 Other pulmonary embolism without acute cor pulmonale: Secondary | ICD-10-CM | POA: Diagnosis not present

## 2016-07-20 DIAGNOSIS — I10 Essential (primary) hypertension: Secondary | ICD-10-CM | POA: Diagnosis not present

## 2016-07-20 DIAGNOSIS — F329 Major depressive disorder, single episode, unspecified: Secondary | ICD-10-CM | POA: Diagnosis not present

## 2016-07-27 DIAGNOSIS — I2699 Other pulmonary embolism without acute cor pulmonale: Secondary | ICD-10-CM | POA: Diagnosis not present

## 2016-07-27 DIAGNOSIS — Z79899 Other long term (current) drug therapy: Secondary | ICD-10-CM | POA: Diagnosis not present

## 2016-08-03 DIAGNOSIS — Z79899 Other long term (current) drug therapy: Secondary | ICD-10-CM | POA: Diagnosis not present

## 2016-08-03 DIAGNOSIS — I2699 Other pulmonary embolism without acute cor pulmonale: Secondary | ICD-10-CM | POA: Diagnosis not present

## 2016-08-10 DIAGNOSIS — I2699 Other pulmonary embolism without acute cor pulmonale: Secondary | ICD-10-CM | POA: Diagnosis not present

## 2016-08-10 DIAGNOSIS — Z79899 Other long term (current) drug therapy: Secondary | ICD-10-CM | POA: Diagnosis not present

## 2016-09-02 DIAGNOSIS — I2699 Other pulmonary embolism without acute cor pulmonale: Secondary | ICD-10-CM | POA: Diagnosis not present

## 2016-09-02 DIAGNOSIS — Z79899 Other long term (current) drug therapy: Secondary | ICD-10-CM | POA: Diagnosis not present

## 2016-09-02 DIAGNOSIS — I2510 Atherosclerotic heart disease of native coronary artery without angina pectoris: Secondary | ICD-10-CM | POA: Diagnosis not present

## 2016-09-02 DIAGNOSIS — I10 Essential (primary) hypertension: Secondary | ICD-10-CM | POA: Diagnosis not present

## 2016-09-04 DIAGNOSIS — N182 Chronic kidney disease, stage 2 (mild): Secondary | ICD-10-CM | POA: Diagnosis not present

## 2016-09-04 DIAGNOSIS — G8222 Paraplegia, incomplete: Secondary | ICD-10-CM | POA: Diagnosis not present

## 2016-09-04 DIAGNOSIS — E1122 Type 2 diabetes mellitus with diabetic chronic kidney disease: Secondary | ICD-10-CM | POA: Diagnosis not present

## 2016-09-04 DIAGNOSIS — I130 Hypertensive heart and chronic kidney disease with heart failure and stage 1 through stage 4 chronic kidney disease, or unspecified chronic kidney disease: Secondary | ICD-10-CM | POA: Diagnosis not present

## 2016-09-04 DIAGNOSIS — I509 Heart failure, unspecified: Secondary | ICD-10-CM | POA: Diagnosis not present

## 2016-09-04 DIAGNOSIS — I2699 Other pulmonary embolism without acute cor pulmonale: Secondary | ICD-10-CM | POA: Diagnosis not present

## 2016-09-07 DIAGNOSIS — G8222 Paraplegia, incomplete: Secondary | ICD-10-CM | POA: Diagnosis not present

## 2016-09-07 DIAGNOSIS — I130 Hypertensive heart and chronic kidney disease with heart failure and stage 1 through stage 4 chronic kidney disease, or unspecified chronic kidney disease: Secondary | ICD-10-CM | POA: Diagnosis not present

## 2016-09-07 DIAGNOSIS — N182 Chronic kidney disease, stage 2 (mild): Secondary | ICD-10-CM | POA: Diagnosis not present

## 2016-09-07 DIAGNOSIS — I2699 Other pulmonary embolism without acute cor pulmonale: Secondary | ICD-10-CM | POA: Diagnosis not present

## 2016-09-07 DIAGNOSIS — I509 Heart failure, unspecified: Secondary | ICD-10-CM | POA: Diagnosis not present

## 2016-09-07 DIAGNOSIS — E1122 Type 2 diabetes mellitus with diabetic chronic kidney disease: Secondary | ICD-10-CM | POA: Diagnosis not present

## 2016-09-09 DIAGNOSIS — G8222 Paraplegia, incomplete: Secondary | ICD-10-CM | POA: Diagnosis not present

## 2016-09-09 DIAGNOSIS — N182 Chronic kidney disease, stage 2 (mild): Secondary | ICD-10-CM | POA: Diagnosis not present

## 2016-09-09 DIAGNOSIS — I130 Hypertensive heart and chronic kidney disease with heart failure and stage 1 through stage 4 chronic kidney disease, or unspecified chronic kidney disease: Secondary | ICD-10-CM | POA: Diagnosis not present

## 2016-09-09 DIAGNOSIS — E1122 Type 2 diabetes mellitus with diabetic chronic kidney disease: Secondary | ICD-10-CM | POA: Diagnosis not present

## 2016-09-09 DIAGNOSIS — I509 Heart failure, unspecified: Secondary | ICD-10-CM | POA: Diagnosis not present

## 2016-09-09 DIAGNOSIS — I2699 Other pulmonary embolism without acute cor pulmonale: Secondary | ICD-10-CM | POA: Diagnosis not present

## 2016-09-14 DIAGNOSIS — N182 Chronic kidney disease, stage 2 (mild): Secondary | ICD-10-CM | POA: Diagnosis not present

## 2016-09-14 DIAGNOSIS — E1122 Type 2 diabetes mellitus with diabetic chronic kidney disease: Secondary | ICD-10-CM | POA: Diagnosis not present

## 2016-09-14 DIAGNOSIS — I509 Heart failure, unspecified: Secondary | ICD-10-CM | POA: Diagnosis not present

## 2016-09-14 DIAGNOSIS — I130 Hypertensive heart and chronic kidney disease with heart failure and stage 1 through stage 4 chronic kidney disease, or unspecified chronic kidney disease: Secondary | ICD-10-CM | POA: Diagnosis not present

## 2016-09-14 DIAGNOSIS — G8222 Paraplegia, incomplete: Secondary | ICD-10-CM | POA: Diagnosis not present

## 2016-09-14 DIAGNOSIS — I2699 Other pulmonary embolism without acute cor pulmonale: Secondary | ICD-10-CM | POA: Diagnosis not present

## 2016-09-22 DIAGNOSIS — I509 Heart failure, unspecified: Secondary | ICD-10-CM | POA: Diagnosis not present

## 2016-09-22 DIAGNOSIS — N182 Chronic kidney disease, stage 2 (mild): Secondary | ICD-10-CM | POA: Diagnosis not present

## 2016-09-22 DIAGNOSIS — I130 Hypertensive heart and chronic kidney disease with heart failure and stage 1 through stage 4 chronic kidney disease, or unspecified chronic kidney disease: Secondary | ICD-10-CM | POA: Diagnosis not present

## 2016-09-22 DIAGNOSIS — E1122 Type 2 diabetes mellitus with diabetic chronic kidney disease: Secondary | ICD-10-CM | POA: Diagnosis not present

## 2016-09-22 DIAGNOSIS — I2699 Other pulmonary embolism without acute cor pulmonale: Secondary | ICD-10-CM | POA: Diagnosis not present

## 2016-09-22 DIAGNOSIS — G8222 Paraplegia, incomplete: Secondary | ICD-10-CM | POA: Diagnosis not present

## 2016-09-24 DIAGNOSIS — Z79899 Other long term (current) drug therapy: Secondary | ICD-10-CM | POA: Diagnosis not present

## 2016-09-24 DIAGNOSIS — I2699 Other pulmonary embolism without acute cor pulmonale: Secondary | ICD-10-CM | POA: Diagnosis not present

## 2016-09-28 DIAGNOSIS — I2699 Other pulmonary embolism without acute cor pulmonale: Secondary | ICD-10-CM | POA: Diagnosis not present

## 2016-09-28 DIAGNOSIS — I130 Hypertensive heart and chronic kidney disease with heart failure and stage 1 through stage 4 chronic kidney disease, or unspecified chronic kidney disease: Secondary | ICD-10-CM | POA: Diagnosis not present

## 2016-09-28 DIAGNOSIS — N182 Chronic kidney disease, stage 2 (mild): Secondary | ICD-10-CM | POA: Diagnosis not present

## 2016-09-28 DIAGNOSIS — G8222 Paraplegia, incomplete: Secondary | ICD-10-CM | POA: Diagnosis not present

## 2016-09-28 DIAGNOSIS — I509 Heart failure, unspecified: Secondary | ICD-10-CM | POA: Diagnosis not present

## 2016-09-28 DIAGNOSIS — E1122 Type 2 diabetes mellitus with diabetic chronic kidney disease: Secondary | ICD-10-CM | POA: Diagnosis not present

## 2016-10-06 DIAGNOSIS — E1122 Type 2 diabetes mellitus with diabetic chronic kidney disease: Secondary | ICD-10-CM | POA: Diagnosis not present

## 2016-10-06 DIAGNOSIS — I509 Heart failure, unspecified: Secondary | ICD-10-CM | POA: Diagnosis not present

## 2016-10-06 DIAGNOSIS — I130 Hypertensive heart and chronic kidney disease with heart failure and stage 1 through stage 4 chronic kidney disease, or unspecified chronic kidney disease: Secondary | ICD-10-CM | POA: Diagnosis not present

## 2016-10-06 DIAGNOSIS — N182 Chronic kidney disease, stage 2 (mild): Secondary | ICD-10-CM | POA: Diagnosis not present

## 2016-10-06 DIAGNOSIS — I2699 Other pulmonary embolism without acute cor pulmonale: Secondary | ICD-10-CM | POA: Diagnosis not present

## 2016-10-06 DIAGNOSIS — G8222 Paraplegia, incomplete: Secondary | ICD-10-CM | POA: Diagnosis not present

## 2016-10-12 DIAGNOSIS — I2699 Other pulmonary embolism without acute cor pulmonale: Secondary | ICD-10-CM | POA: Diagnosis not present

## 2016-10-12 DIAGNOSIS — E1122 Type 2 diabetes mellitus with diabetic chronic kidney disease: Secondary | ICD-10-CM | POA: Diagnosis not present

## 2016-10-12 DIAGNOSIS — I509 Heart failure, unspecified: Secondary | ICD-10-CM | POA: Diagnosis not present

## 2016-10-12 DIAGNOSIS — N182 Chronic kidney disease, stage 2 (mild): Secondary | ICD-10-CM | POA: Diagnosis not present

## 2016-10-12 DIAGNOSIS — I130 Hypertensive heart and chronic kidney disease with heart failure and stage 1 through stage 4 chronic kidney disease, or unspecified chronic kidney disease: Secondary | ICD-10-CM | POA: Diagnosis not present

## 2016-10-12 DIAGNOSIS — G8222 Paraplegia, incomplete: Secondary | ICD-10-CM | POA: Diagnosis not present

## 2016-10-13 DIAGNOSIS — I509 Heart failure, unspecified: Secondary | ICD-10-CM | POA: Diagnosis not present

## 2016-10-13 DIAGNOSIS — I2699 Other pulmonary embolism without acute cor pulmonale: Secondary | ICD-10-CM | POA: Diagnosis not present

## 2016-10-13 DIAGNOSIS — G8222 Paraplegia, incomplete: Secondary | ICD-10-CM | POA: Diagnosis not present

## 2016-10-13 DIAGNOSIS — E1122 Type 2 diabetes mellitus with diabetic chronic kidney disease: Secondary | ICD-10-CM | POA: Diagnosis not present

## 2016-10-13 DIAGNOSIS — I130 Hypertensive heart and chronic kidney disease with heart failure and stage 1 through stage 4 chronic kidney disease, or unspecified chronic kidney disease: Secondary | ICD-10-CM | POA: Diagnosis not present

## 2016-10-13 DIAGNOSIS — N182 Chronic kidney disease, stage 2 (mild): Secondary | ICD-10-CM | POA: Diagnosis not present

## 2016-10-15 DIAGNOSIS — I888 Other nonspecific lymphadenitis: Secondary | ICD-10-CM | POA: Diagnosis not present

## 2016-10-15 DIAGNOSIS — R609 Edema, unspecified: Secondary | ICD-10-CM | POA: Diagnosis not present

## 2016-10-15 DIAGNOSIS — B20 Human immunodeficiency virus [HIV] disease: Secondary | ICD-10-CM | POA: Diagnosis not present

## 2016-10-15 DIAGNOSIS — Z79899 Other long term (current) drug therapy: Secondary | ICD-10-CM | POA: Diagnosis not present

## 2016-10-15 DIAGNOSIS — I10 Essential (primary) hypertension: Secondary | ICD-10-CM | POA: Diagnosis not present

## 2016-10-15 DIAGNOSIS — I2699 Other pulmonary embolism without acute cor pulmonale: Secondary | ICD-10-CM | POA: Diagnosis not present

## 2016-10-15 DIAGNOSIS — F5101 Primary insomnia: Secondary | ICD-10-CM | POA: Diagnosis not present

## 2016-10-21 DIAGNOSIS — E1122 Type 2 diabetes mellitus with diabetic chronic kidney disease: Secondary | ICD-10-CM | POA: Diagnosis not present

## 2016-10-21 DIAGNOSIS — N182 Chronic kidney disease, stage 2 (mild): Secondary | ICD-10-CM | POA: Diagnosis not present

## 2016-10-21 DIAGNOSIS — I130 Hypertensive heart and chronic kidney disease with heart failure and stage 1 through stage 4 chronic kidney disease, or unspecified chronic kidney disease: Secondary | ICD-10-CM | POA: Diagnosis not present

## 2016-10-21 DIAGNOSIS — I2699 Other pulmonary embolism without acute cor pulmonale: Secondary | ICD-10-CM | POA: Diagnosis not present

## 2016-10-21 DIAGNOSIS — I509 Heart failure, unspecified: Secondary | ICD-10-CM | POA: Diagnosis not present

## 2016-10-21 DIAGNOSIS — G8222 Paraplegia, incomplete: Secondary | ICD-10-CM | POA: Diagnosis not present

## 2016-10-25 DIAGNOSIS — N182 Chronic kidney disease, stage 2 (mild): Secondary | ICD-10-CM | POA: Diagnosis not present

## 2016-10-25 DIAGNOSIS — B20 Human immunodeficiency virus [HIV] disease: Secondary | ICD-10-CM | POA: Diagnosis not present

## 2016-10-25 DIAGNOSIS — E1122 Type 2 diabetes mellitus with diabetic chronic kidney disease: Secondary | ICD-10-CM | POA: Diagnosis not present

## 2016-10-25 DIAGNOSIS — J449 Chronic obstructive pulmonary disease, unspecified: Secondary | ICD-10-CM | POA: Diagnosis not present

## 2016-10-28 DIAGNOSIS — I2699 Other pulmonary embolism without acute cor pulmonale: Secondary | ICD-10-CM | POA: Diagnosis not present

## 2016-10-28 DIAGNOSIS — G8222 Paraplegia, incomplete: Secondary | ICD-10-CM | POA: Diagnosis not present

## 2016-10-28 DIAGNOSIS — N182 Chronic kidney disease, stage 2 (mild): Secondary | ICD-10-CM | POA: Diagnosis not present

## 2016-10-28 DIAGNOSIS — E1122 Type 2 diabetes mellitus with diabetic chronic kidney disease: Secondary | ICD-10-CM | POA: Diagnosis not present

## 2016-10-28 DIAGNOSIS — I130 Hypertensive heart and chronic kidney disease with heart failure and stage 1 through stage 4 chronic kidney disease, or unspecified chronic kidney disease: Secondary | ICD-10-CM | POA: Diagnosis not present

## 2016-10-28 DIAGNOSIS — I509 Heart failure, unspecified: Secondary | ICD-10-CM | POA: Diagnosis not present

## 2016-11-02 DIAGNOSIS — E1122 Type 2 diabetes mellitus with diabetic chronic kidney disease: Secondary | ICD-10-CM | POA: Diagnosis not present

## 2016-11-02 DIAGNOSIS — N182 Chronic kidney disease, stage 2 (mild): Secondary | ICD-10-CM | POA: Diagnosis not present

## 2016-11-02 DIAGNOSIS — G8222 Paraplegia, incomplete: Secondary | ICD-10-CM | POA: Diagnosis not present

## 2016-11-02 DIAGNOSIS — I130 Hypertensive heart and chronic kidney disease with heart failure and stage 1 through stage 4 chronic kidney disease, or unspecified chronic kidney disease: Secondary | ICD-10-CM | POA: Diagnosis not present

## 2016-11-02 DIAGNOSIS — I509 Heart failure, unspecified: Secondary | ICD-10-CM | POA: Diagnosis not present

## 2016-11-02 DIAGNOSIS — I2699 Other pulmonary embolism without acute cor pulmonale: Secondary | ICD-10-CM | POA: Diagnosis not present

## 2016-11-03 DIAGNOSIS — I2782 Chronic pulmonary embolism: Secondary | ICD-10-CM | POA: Diagnosis not present

## 2016-11-03 DIAGNOSIS — Z7901 Long term (current) use of anticoagulants: Secondary | ICD-10-CM | POA: Diagnosis not present

## 2016-11-10 DIAGNOSIS — Z79899 Other long term (current) drug therapy: Secondary | ICD-10-CM | POA: Diagnosis not present

## 2016-11-10 DIAGNOSIS — I2699 Other pulmonary embolism without acute cor pulmonale: Secondary | ICD-10-CM | POA: Diagnosis not present

## 2016-11-15 DIAGNOSIS — I10 Essential (primary) hypertension: Secondary | ICD-10-CM | POA: Diagnosis not present

## 2016-11-15 DIAGNOSIS — E119 Type 2 diabetes mellitus without complications: Secondary | ICD-10-CM | POA: Diagnosis not present

## 2016-11-15 DIAGNOSIS — Z79899 Other long term (current) drug therapy: Secondary | ICD-10-CM | POA: Diagnosis not present

## 2016-11-15 DIAGNOSIS — H53009 Unspecified amblyopia, unspecified eye: Secondary | ICD-10-CM | POA: Diagnosis not present

## 2016-11-15 DIAGNOSIS — B20 Human immunodeficiency virus [HIV] disease: Secondary | ICD-10-CM | POA: Diagnosis not present

## 2016-11-15 DIAGNOSIS — I2699 Other pulmonary embolism without acute cor pulmonale: Secondary | ICD-10-CM | POA: Diagnosis not present

## 2016-11-18 ENCOUNTER — Other Ambulatory Visit: Payer: Self-pay | Admitting: Ophthalmology

## 2016-11-18 DIAGNOSIS — R51 Headache: Secondary | ICD-10-CM

## 2016-11-18 DIAGNOSIS — G8929 Other chronic pain: Secondary | ICD-10-CM

## 2016-11-18 DIAGNOSIS — H53131 Sudden visual loss, right eye: Secondary | ICD-10-CM | POA: Diagnosis not present

## 2016-11-23 ENCOUNTER — Ambulatory Visit: Payer: Medicare Other

## 2016-11-24 DIAGNOSIS — I2782 Chronic pulmonary embolism: Secondary | ICD-10-CM | POA: Diagnosis not present

## 2016-11-24 DIAGNOSIS — Z7901 Long term (current) use of anticoagulants: Secondary | ICD-10-CM | POA: Diagnosis not present

## 2016-11-26 ENCOUNTER — Emergency Department: Payer: Medicare Other

## 2016-11-26 ENCOUNTER — Emergency Department
Admission: EM | Admit: 2016-11-26 | Discharge: 2016-11-26 | Disposition: A | Discharge status: Home / Self Care | Payer: Medicare Other | Attending: Student in an Organized Health Care Education/Training Program | Admitting: Student in an Organized Health Care Education/Training Program

## 2016-11-26 ENCOUNTER — Encounter: Payer: Self-pay | Admitting: Emergency Medicine

## 2016-11-26 DIAGNOSIS — Z87891 Personal history of nicotine dependence: Secondary | ICD-10-CM | POA: Insufficient documentation

## 2016-11-26 DIAGNOSIS — J449 Chronic obstructive pulmonary disease, unspecified: Secondary | ICD-10-CM | POA: Diagnosis not present

## 2016-11-26 DIAGNOSIS — S299XXA Unspecified injury of thorax, initial encounter: Secondary | ICD-10-CM | POA: Diagnosis present

## 2016-11-26 DIAGNOSIS — X58XXXA Exposure to other specified factors, initial encounter: Secondary | ICD-10-CM | POA: Diagnosis not present

## 2016-11-26 DIAGNOSIS — R079 Chest pain, unspecified: Secondary | ICD-10-CM | POA: Diagnosis not present

## 2016-11-26 DIAGNOSIS — I2510 Atherosclerotic heart disease of native coronary artery without angina pectoris: Secondary | ICD-10-CM | POA: Insufficient documentation

## 2016-11-26 DIAGNOSIS — E119 Type 2 diabetes mellitus without complications: Secondary | ICD-10-CM | POA: Diagnosis not present

## 2016-11-26 DIAGNOSIS — I10 Essential (primary) hypertension: Secondary | ICD-10-CM | POA: Diagnosis not present

## 2016-11-26 DIAGNOSIS — Y929 Unspecified place or not applicable: Secondary | ICD-10-CM | POA: Insufficient documentation

## 2016-11-26 DIAGNOSIS — Y939 Activity, unspecified: Secondary | ICD-10-CM | POA: Diagnosis not present

## 2016-11-26 DIAGNOSIS — Z79899 Other long term (current) drug therapy: Secondary | ICD-10-CM | POA: Insufficient documentation

## 2016-11-26 DIAGNOSIS — R05 Cough: Secondary | ICD-10-CM | POA: Diagnosis not present

## 2016-11-26 DIAGNOSIS — J45909 Unspecified asthma, uncomplicated: Secondary | ICD-10-CM | POA: Diagnosis not present

## 2016-11-26 DIAGNOSIS — Z7901 Long term (current) use of anticoagulants: Secondary | ICD-10-CM | POA: Insufficient documentation

## 2016-11-26 DIAGNOSIS — S22000A Wedge compression fracture of unspecified thoracic vertebra, initial encounter for closed fracture: Secondary | ICD-10-CM

## 2016-11-26 DIAGNOSIS — Y999 Unspecified external cause status: Secondary | ICD-10-CM | POA: Insufficient documentation

## 2016-11-26 DIAGNOSIS — S22050A Wedge compression fracture of T5-T6 vertebra, initial encounter for closed fracture: Secondary | ICD-10-CM | POA: Diagnosis not present

## 2016-11-26 LAB — CBC WITH DIFFERENTIAL/PLATELET
Basophils Absolute: 0.1 10*3/uL (ref 0–0.1)
Basophils Relative: 1 %
EOS PCT: 2 %
Eosinophils Absolute: 0.2 10*3/uL (ref 0–0.7)
HCT: 39.1 % — ABNORMAL LOW (ref 40.0–52.0)
Hemoglobin: 13.2 g/dL (ref 13.0–18.0)
LYMPHS ABS: 1.5 10*3/uL (ref 1.0–3.6)
LYMPHS PCT: 21 %
MCH: 33 pg (ref 26.0–34.0)
MCHC: 33.8 g/dL (ref 32.0–36.0)
MCV: 97.7 fL (ref 80.0–100.0)
MONO ABS: 0.4 10*3/uL (ref 0.2–1.0)
MONOS PCT: 6 %
NEUTROS PCT: 70 %
Neutro Abs: 5.1 10*3/uL (ref 1.4–6.5)
PLATELETS: 187 10*3/uL (ref 150–440)
RBC: 4 MIL/uL — AB (ref 4.40–5.90)
RDW: 14.5 % (ref 11.5–14.5)
WBC: 7.2 10*3/uL (ref 3.8–10.6)

## 2016-11-26 LAB — BASIC METABOLIC PANEL
Anion gap: 5 (ref 5–15)
BUN: 18 mg/dL (ref 6–20)
CO2: 27 mmol/L (ref 22–32)
Calcium: 8.8 mg/dL — ABNORMAL LOW (ref 8.9–10.3)
Chloride: 111 mmol/L (ref 101–111)
Creatinine, Ser: 1.47 mg/dL — ABNORMAL HIGH (ref 0.61–1.24)
GFR calc Af Amer: 59 mL/min — ABNORMAL LOW (ref >60)
GFR, EST NON AFRICAN AMERICAN: 50 mL/min — AB (ref >60)
GLUCOSE: 110 mg/dL — AB (ref 65–99)
Potassium: 4.7 mmol/L (ref 3.5–5.1)
Sodium: 143 mmol/L (ref 135–145)

## 2016-11-26 LAB — PROTIME-INR
INR: 1.81
Prothrombin Time: 21.2 seconds — ABNORMAL HIGH (ref 11.4–15.2)

## 2016-11-26 LAB — TROPONIN I: Troponin I: <0.03 ng/mL (ref <0.03)

## 2016-11-26 MED ORDER — IPRATROPIUM-ALBUTEROL 0.5-2.5 (3) MG/3ML IN SOLN
3.0000 mL | Freq: Once | RESPIRATORY_TRACT | Status: AC
  Administered 2016-11-26: 3 mL via RESPIRATORY_TRACT
  Filled 2016-11-26: qty 3

## 2016-11-26 MED ORDER — SODIUM CHLORIDE 0.9 % IV BOLUS (SEPSIS): 500.0000 mL | Freq: Once | INTRAVENOUS | Status: DC

## 2016-11-26 MED ORDER — SODIUM CHLORIDE 0.9 % IV BOLUS (SEPSIS)
1000.0000 mL | Freq: Once | INTRAVENOUS | Status: AC
  Administered 2016-11-26: 1000 mL via INTRAVENOUS

## 2016-11-26 MED ORDER — FENTANYL CITRATE (PF) 100 MCG/2ML IJ SOLN
100.0000 ug | INTRAMUSCULAR | Status: DC | PRN
  Administered 2016-11-26: 100 ug via INTRAVENOUS
  Filled 2016-11-26: qty 2

## 2016-11-26 MED ORDER — PROMETHAZINE HCL 25 MG/ML IJ SOLN
12.5000 mg | Freq: Once | INTRAMUSCULAR | Status: AC
  Administered 2016-11-26: 12.5 mg via INTRAVENOUS
  Filled 2016-11-26: qty 1

## 2016-11-26 MED ORDER — HYDROCODONE-ACETAMINOPHEN 5-325 MG PO TABS
1.0000 | ORAL_TABLET | Freq: Once | ORAL | Status: AC
  Administered 2016-11-26: 1 via ORAL
  Filled 2016-11-26: qty 1

## 2016-11-26 MED ORDER — IOPAMIDOL (ISOVUE-370) INJECTION 76%
75.0000 mL | Freq: Once | INTRAVENOUS | Status: AC | PRN
  Administered 2016-11-26: 75 mL via INTRAVENOUS

## 2016-11-26 MED ORDER — NITROGLYCERIN 0.4 MG SL SUBL
0.4000 mg | SUBLINGUAL_TABLET | SUBLINGUAL | Status: DC | PRN
  Administered 2016-11-26: 0.4 mg via SUBLINGUAL
  Filled 2016-11-26: qty 1

## 2016-11-26 MED ORDER — HYDROCODONE-ACETAMINOPHEN 5-325 MG PO TABS: 1.0000 | ORAL_TABLET | ORAL | 0 refills | Status: DC | PRN

## 2016-11-26 NOTE — ED Notes (Signed)
Pt. Going home with sister. 

## 2016-11-26 NOTE — ED Provider Notes (Addendum)
[facility] Emergency Department Provider Note    First MD Initiated Contact with Patient 11/26/16 1341     (approximate)  I have reviewed the triage vital signs and the nursing notes.   HISTORY  Chief Complaint Chest Pain    HPI Brian Hampton is a 61 y.o. male with a history of HIV, presents with c/c of several weeks of intermittent chest pain.  States he has been having the pain for months.  NO worsening with exertion.  States today's pain was 14/10 in severity. + h/o DVT with PE s/p IVC filter on coumadin.   Has had a cough sometimes.  No n/V/d.  + LUQ abdominal pain.  Thinks he had a fever last month.  No recent trauma.  States that he has been compliant with his HAART.   Past Medical History:  Diagnosis Date  . Anemia   . Asthma   . Collagen vascular disease (HCC)   . COPD (chronic obstructive pulmonary disease) (HCC)   . Coronary artery disease   . DVT (deep venous thrombosis) (HCC)   . Emphysema/COPD (HCC)   . HIV (human immunodeficiency virus infection) (HCC)   . Hypertension   . Lung mass   . Myocardial infarction (HCC)    in 2000  . Type 2 diabetes mellitus (HCC)    Family History  Problem Relation Age of Onset  . CAD     Past Surgical History:  Procedure Laterality Date  . PERIPHERAL VASCULAR CATHETERIZATION N/A 03/16/2016   Procedure: IVC Filter Insertion;  Surgeon: Renford Dills, MD;  Location: [facility] INVASIVE CV LAB;  Service: Cardiovascular;  Laterality: N/A;   Patient Active Problem List   Diagnosis Date Noted  . Hypomagnesemia 05/05/2016  . Rhabdomyolysis 05/05/2016  . Acute pulmonary embolism (HCC) 03/17/2016  . Right leg DVT (HCC) 03/17/2016  . Hypokalemia 03/17/2016  . S/P IVC filter 03/17/2016  . SOB (shortness of breath) 03/15/2016  . Dysphagia 02/02/2016  . GERD (gastroesophageal reflux disease) 02/02/2016  . Hyperthyroidism 01/31/2016  . 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 Elevated transaminase level  01/31/2016  . Anemia 01/31/2016  . Thrombocytopenia (HCC) 01/31/2016  . Pyuria 01/31/2016  . Weakness 01/29/2016  . HIV (human immunodeficiency virus infection) (HCC) 01/29/2016  . CAD (coronary artery disease) 01/29/2016  . COPD (chronic obstructive pulmonary disease) (HCC) 01/29/2016  . Diabetes mellitus (HCC) 01/29/2016  . Leg weakness, bilateral 01/13/2016  . Chest pain 06/24/2015      Prior to Admission medications   Medication Sig Start Date End Date Taking? Authorizing Provider  abacavir-dolutegravir-lamiVUDine (TRIUMEQ) 600-50-300 MG tablet Take 1 tablet by mouth every morning. Reported on 01/30/2016 01/27/16  Yes Historical Provider, MD  albuterol (PROVENTIL HFA;VENTOLIN HFA) 108 (90 BASE) MCG/ACT inhaler Inhale 2 puffs into the lungs every 6 (six) hours as needed for wheezing or shortness of breath.   Yes Historical Provider, MD  albuterol (PROVENTIL) (2.5 MG/3ML) 0.083% nebulizer solution Take 2.5 mg by nebulization every 6 (six) hours as needed for wheezing or shortness of breath.   Yes Historical Provider, MD  atorvastatin (LIPITOR) 40 MG tablet Take 40 mg by mouth daily.    Yes Historical Provider, MD  Calcium Carbonate-Vitamin D (CALCIUM 600+D) 600-400 MG-UNIT tablet Take 1 tablet by mouth daily.    Yes Historical Provider, MD  carvedilol (COREG) 12.5 MG tablet Take 12.5 mg by mouth 2 (two) times daily with a meal.   Yes Historical Provider, MD  cetirizine (ZYRTEC) 10 MG tablet Take 10  mg by mouth every morning.    Yes Historical Provider, MD  clonazePAM (KLONOPIN) 0.5 MG tablet Take 1 tablet (0.5 mg total) by mouth 2 (two) times daily as needed for anxiety. 01/31/16  Yes Katharina Caper, MD  EPINEPHrine (EPIPEN 2-PAK) 0.3 mg/0.3 mL IJ SOAJ injection Inject 0.3 mg into the muscle once as needed (for severe allergic reaction).   Yes Historical Provider, MD  escitalopram (LEXAPRO) 20 MG tablet Take 20 mg by mouth every morning.    Yes Historical Provider, MD  isosorbide mononitrate  (IMDUR) 30 MG 24 hr tablet Take 30 mg by mouth every morning.    Yes Historical Provider, MD  levalbuterol (XOPENEX HFA) 45 MCG/ACT inhaler Inhale 1 puff into the lungs 3 (three) times daily. 05/12/16  Yes Katha Hamming, MD  linaclotide (LINZESS) 290 MCG CAPS capsule Take 290 mcg by mouth daily before breakfast.   Yes Historical Provider, MD  meloxicam (MOBIC) 7.5 MG tablet Take 7.5 mg by mouth daily.    Yes Historical Provider, MD  methimazole (TAPAZOLE) 10 MG tablet Take 1 tablet (10 mg total) by mouth daily. 02/02/16  Yes Katharina Caper, MD  mirtazapine (REMERON) 15 MG tablet Take 15 mg by mouth at bedtime.   Yes Historical Provider, MD  mometasone-formoterol (DULERA) 200-5 MCG/ACT AERO Inhale 2 puffs into the lungs 2 (two) times daily.    Yes Historical Provider, MD  Multiple Vitamin (MULTIVITAMIN WITH MINERALS) TABS tablet Take 1 tablet by mouth every morning.    Yes Historical Provider, MD  nitroGLYCERIN (NITROSTAT) 0.4 MG SL tablet Place 0.4 mg under the tongue every 5 (five) minutes as needed for chest pain.   Yes Historical Provider, MD  omeprazole (PRILOSEC) 20 MG capsule Take 20 mg by mouth daily.    Yes Historical Provider, MD  tiotropium (SPIRIVA) 18 MCG inhalation capsule Place 18 mcg into inhaler and inhale daily.    Yes Historical Provider, MD  traMADol (ULTRAM) 50 MG tablet Take 1 tablet (50 mg total) by mouth every 6 (six) hours as needed. 03/17/16  Yes Katharina Caper, MD  warfarin (COUMADIN) 4 MG tablet Take 4 mg by mouth daily. m-w-f takes 1 4 mg tab. On the others days he takes 0.5 tab   Yes Historical Provider, MD  zolpidem (AMBIEN CR) 12.5 MG CR tablet Take 1 tablet (12.5 mg total) by mouth at bedtime as needed for sleep. 03/17/16  Yes Katharina Caper, MD  enoxaparin (LOVENOX) 150 MG/ML injection Inject 0.44 mLs (65 mg total) into the skin every 12 (twelve) hours. Patient not taking: Reported on 11/26/2016 03/17/16   Katharina Caper, MD  gemfibrozil (LOPID) 600 MG tablet Take 600 mg by  mouth 2 (two) times daily before a meal.    Historical Provider, MD  HYDROcodone-acetaminophen (NORCO) 5-325 MG tablet Take 1 tablet by mouth every 4 (four) hours as needed for moderate pain. 11/26/16   Willy Eddy, MD  predniSONE (STERAPRED UNI-PAK 21 TAB) 10 MG (21) TBPK tablet Take 1 tablet (10 mg total) by mouth daily. Taper by 10 mg po daily Patient not taking: Reported on 11/26/2016 05/12/16   Katha Hamming, MD  senna-docusate (SENOKOT-S) 8.6-50 MG tablet Take 1 tablet by mouth at bedtime as needed for mild constipation. Patient not taking: Reported on 11/26/2016 01/31/16   Katharina Caper, MD    Allergies Aspirin; Bee venom; Penicillins; Prednisone; Sulfa antibiotics; Sulfasalazine; Theophyllines; and Theophylline    Social History Social History  Substance Use Topics  . Smoking status: Former Games developer  .  Smokeless tobacco: Never Used  . Alcohol use No    Review of Systems Patient denies headaches, rhinorrhea, blurry vision, numbness, shortness of breath, chest pain, edema, cough, abdominal pain, nausea, vomiting, diarrhea, dysuria, fevers, rashes or hallucinations unless otherwise stated above in HPI. ____________________________________________   PHYSICAL EXAM:  VITAL SIGNS: Vitals:   11/26/16 1630 11/26/16 1700  BP: 138/81 (!) 161/100  Pulse: 60 62  Resp: (!) 21 19  Temp:      Constitutional: Alert and oriented. Well appearing and in no acute distress. Eyes: Conjunctivae are normal. PERRL. EOMI. Head: Atraumatic. Nose: No congestion/rhinnorhea. Mouth/Throat: Mucous membranes are moist.  Oropharynx non-erythematous. Neck: No stridor. Painless ROM. No cervical spine tenderness to palpation Hematological/Lymphatic/Immunilogical: No cervical lymphadenopathy. Cardiovascular: Normal rate, regular rhythm. Grossly normal heart sounds.  Good peripheral circulation. Respiratory: Normal respiratory effort.  No retractions. Lungs with faint expiratory wheezing, good air  movement thoughout.   Gastrointestinal: Soft and nontender. No distention. No abdominal bruits. No CVA tenderness. Genitourinary:  Musculoskeletal: No lower extremity tenderness nor edema.  No joint effusions.  ttp in mid upper thoracic spine, no step offs or deformities Neurologic:  Normal speech and language. No gross focal neurologic deficits are appreciated. No gait instability. Skin:  Skin is warm, dry and intact. No rash noted. Psychiatric: Mood and affect are normal. Speech and behavior are normal.  ____________________________________________   LABS (all labs ordered are listed, but only abnormal results are displayed)  Results for orders placed or performed during the hospital encounter of 11/26/16 (from the past 24 hour(s))  CBC with Differential/Platelet     Status: Abnormal   Collection Time: 11/26/16  1:50 PM  Result Value Ref Range   WBC 7.2 3.8 - 10.6 K/uL   RBC 4.00 (L) 4.40 - 5.90 MIL/uL   Hemoglobin 13.2 13.0 - 18.0 g/dL   HCT 16.1 (L) 09.6 - 04.5 %   MCV 97.7 80.0 - 100.0 fL   MCH 33.0 26.0 - 34.0 pg   MCHC 33.8 32.0 - 36.0 g/dL   RDW 40.9 81.1 - 91.4 %   Platelets 187 150 - 440 K/uL   Neutrophils Relative % 70 %   Neutro Abs 5.1 1.4 - 6.5 K/uL   Lymphocytes Relative 21 %   Lymphs Abs 1.5 1.0 - 3.6 K/uL   Monocytes Relative 6 %   Monocytes Absolute 0.4 0.2 - 1.0 K/uL   Eosinophils Relative 2 %   Eosinophils Absolute 0.2 0 - 0.7 K/uL   Basophils Relative 1 %   Basophils Absolute 0.1 0 - 0.1 K/uL  Basic metabolic panel     Status: Abnormal   Collection Time: 11/26/16  1:50 PM  Result Value Ref Range   Sodium 143 135 - 145 mmol/L   Potassium 4.7 3.5 - 5.1 mmol/L   Chloride 111 101 - 111 mmol/L   CO2 27 22 - 32 mmol/L   Glucose, Bld 110 (H) 65 - 99 mg/dL   BUN 18 6 - 20 mg/dL   Creatinine, Ser 7.82 (H) 0.61 - 1.24 mg/dL   Calcium 8.8 (L) 8.9 - 10.3 mg/dL   GFR calc non Af Amer 50 (L) >60 mL/min   GFR calc Af Amer 59 (L) >60 mL/min   Anion gap 5 5 - 15    Troponin I     Status: None   Collection Time: 11/26/16  1:50 PM  Result Value Ref Range   Troponin I <0.03 <0.03 ng/mL  Protime-INR     Status: Abnormal  Collection Time: 11/26/16  2:47 PM  Result Value Ref Range   Prothrombin Time 21.2 (H) 11.4 - 15.2 seconds   INR 1.81    ____________________________________________  EKG My review and personal interpretation at Time: 13:44   Indication: chest pain  Rate: 64  Rhythm: sinus Axis: normal Other: no st elevations or depressions, normal intervals ____________________________________________  RADIOLOGY  I personally reviewed all radiographic images ordered to evaluate for the above acute complaints and reviewed radiology reports and findings.  These findings were personally discussed with the patient.  Please see medical record for radiology report.  ____________________________________________   PROCEDURES  Procedure(s) performed:  Procedures    Critical Care performed: no ____________________________________________   INITIAL IMPRESSION / ASSESSMENT AND PLAN / ED COURSE  Pertinent labs & imaging results that were available during my care of the patient were reviewed by me and considered in my medical decision making (see chart for details).  DDX: ACS, pericarditis, esophagitis, boerhaaves, pe, dissection, pna, bronchitis, costochondritis   Sajan L Alfieri is a 61 y.o. who presents to the ED with chief complaint of chest pain. Patient with history of HIV.  Patient is AFVSS in ED. Exam as above. Given current presentation have considered the above differential.EKG shows no evidence of acute ischemia. Troponin negative Chest x-ray shows evidence of possible acute compression fracture.  CTA shows no evidence of dissection.  PAun likely secondary to compression fracture.  No other evidence of acute injury.  Not consistent with ACS.  No evidence of infectious process.  Have discussed with the patient and available family all  diagnostics and treatments performed thus far and all questions were answered to the best of my ability. The patient demonstrates understanding and agreement with plan.             ____________________________________________   FINAL CLINICAL IMPRESSION(S) / ED DIAGNOSES  Final diagnoses:  Compression fracture of thoracic vertebra, closed, initial encounter (HCC)  Chest pain, unspecified type      NEW MEDICATIONS STARTED DURING THIS VISIT:  New Prescriptions   HYDROCODONE-ACETAMINOPHEN (NORCO) 5-325 MG TABLET    Take 1 tablet by mouth every 4 (four) hours as needed for moderate pain.     Note:  This document was prepared using Dragon voice recognition software and may include unintentional dictation errors.    Willy Eddy, MD 11/26/16 1827    Willy Eddy, MD 11/26/16 [phone]

## 2016-11-26 NOTE — ED Triage Notes (Signed)
Pt to ED via EMS from home c/o chest pain for over a week.  Sharp pain to left chest radiating to left ribs and left arm.  States worse with palpation and breathing.  Patient took 1 SL nitro at home without relief.  Hx of COPD, asthma, HTN, 2 MIs without stent placement.  EMS vitals 99% RA, 150/92 BP, 69 HR.

## 2016-11-30 ENCOUNTER — Other Ambulatory Visit: Payer: Self-pay | Admitting: Physician Assistant

## 2016-11-30 DIAGNOSIS — S22050A Wedge compression fracture of T5-T6 vertebra, initial encounter for closed fracture: Secondary | ICD-10-CM

## 2016-12-01 DIAGNOSIS — I2782 Chronic pulmonary embolism: Secondary | ICD-10-CM | POA: Diagnosis not present

## 2016-12-01 DIAGNOSIS — Z7901 Long term (current) use of anticoagulants: Secondary | ICD-10-CM | POA: Diagnosis not present

## 2016-12-02 ENCOUNTER — Ambulatory Visit
Admission: RE | Admit: 2016-12-02 | Discharge: 2016-12-02 | Disposition: A | Discharge status: Home / Self Care | Payer: Medicare Other | Source: Ambulatory Visit | Attending: Physician Assistant | Admitting: Physician Assistant

## 2016-12-02 DIAGNOSIS — M5134 Other intervertebral disc degeneration, thoracic region: Secondary | ICD-10-CM | POA: Insufficient documentation

## 2016-12-02 DIAGNOSIS — M4854XA Collapsed vertebra, not elsewhere classified, thoracic region, initial encounter for fracture: Secondary | ICD-10-CM | POA: Diagnosis not present

## 2016-12-02 DIAGNOSIS — R29890 Loss of height: Secondary | ICD-10-CM | POA: Insufficient documentation

## 2016-12-02 DIAGNOSIS — S22050A Wedge compression fracture of T5-T6 vertebra, initial encounter for closed fracture: Secondary | ICD-10-CM | POA: Diagnosis present

## 2016-12-02 DIAGNOSIS — M546 Pain in thoracic spine: Secondary | ICD-10-CM | POA: Diagnosis not present

## 2016-12-08 DIAGNOSIS — S22050A Wedge compression fracture of T5-T6 vertebra, initial encounter for closed fracture: Secondary | ICD-10-CM | POA: Diagnosis not present

## 2016-12-13 DIAGNOSIS — H25043 Posterior subcapsular polar age-related cataract, bilateral: Secondary | ICD-10-CM | POA: Diagnosis not present

## 2016-12-13 DIAGNOSIS — H5460 Unqualified visual loss, one eye, unspecified: Secondary | ICD-10-CM | POA: Diagnosis not present

## 2016-12-16 ENCOUNTER — Encounter
Admission: RE | Admit: 2016-12-16 | Discharge: 2016-12-16 | Disposition: A | Discharge status: Home / Self Care | Payer: Medicare Other | Source: Ambulatory Visit | Attending: Orthopedic Surgery | Admitting: Orthopedic Surgery

## 2016-12-16 DIAGNOSIS — M4854XA Collapsed vertebra, not elsewhere classified, thoracic region, initial encounter for fracture: Secondary | ICD-10-CM | POA: Diagnosis not present

## 2016-12-16 DIAGNOSIS — Z01818 Encounter for other preprocedural examination: Secondary | ICD-10-CM | POA: Insufficient documentation

## 2016-12-16 HISTORY — DX: Personal history of other venous thrombosis and embolism: Z86.718

## 2016-12-16 HISTORY — DX: Cardiac arrhythmia, unspecified: I49.9

## 2016-12-16 HISTORY — DX: Pneumonia, unspecified organism: J18.9

## 2016-12-16 HISTORY — DX: Major depressive disorder, single episode, unspecified: F32.9

## 2016-12-16 HISTORY — DX: Other pulmonary embolism without acute cor pulmonale: I26.99

## 2016-12-16 HISTORY — DX: Endocarditis, valve unspecified: I38

## 2016-12-16 HISTORY — DX: Depression, unspecified: F32.A

## 2016-12-16 HISTORY — DX: Unspecified cataract: H26.9

## 2016-12-16 HISTORY — DX: Cardiac murmur, unspecified: R01.1

## 2016-12-16 HISTORY — DX: Other allergic rhinitis: J30.89

## 2016-12-16 LAB — SURGICAL PCR SCREEN
MRSA, PCR: NEGATIVE
Staphylococcus aureus: NEGATIVE

## 2016-12-16 NOTE — Patient Instructions (Addendum)
Your procedure is scheduled on: 12/21/16 Tues Report to Same Day Surgery 2nd floor medical mall [facility](Medical Mall Entrance-take elevator on left to 2nd floor.  Check in with surgery information desk.) To find out your arrival time please call [phone] between 1PM - 3PM on 12/20/16 Mon  Remember: Instructions that are not followed completely may result in serious medical risk, up to and including death, or upon the discretion of your surgeon and anesthesiologist your surgery may need to be rescheduled.    _x___ 1. Do not eat food or drink liquids after midnight. No gum chewing or                              hard candies.     __x__ 2. No Alcohol for 24 hours before or after surgery.   __x__3. No Smoking for 24 prior to surgery.   ____  4. Bring all medications with you on the day of surgery if instructed.    __x__ 5. Notify your doctor if there is any change in your medical condition     (cold, fever, infections).     Do not wear jewelry, make-up, hairpins, clips or nail polish.  Do not wear lotions, powders, or perfumes. You may wear deodorant.  Do not shave 48 hours prior to surgery. Men may shave face and neck.  Do not bring valuables to the hospital.    [facility] is not responsible for any belongings or valuables.               Contacts, dentures or bridgework may not be worn into surgery.  Leave your suitcase in the car. After surgery it may be brought to your room.  For patients admitted to the hospital, discharge time is determined by your                       treatment team.   Patients discharged the day of surgery will not be allowed to drive home.  You will need someone to drive you home and stay with you the night of your procedure.    Please read over the following fact sheets that you were given:   [facility] Preparing for Surgery and or MRSA Information   _x___ Take anti-hypertensive (unless it includes a diuretic), cardiac, seizure, asthma,     anti-reflux and  psychiatric medicines. These include:  1. abacavir-dolutegravir-lamiVUDine   2.albuterol (PROVENTIL   3.atorvastatin (LIPITOR  4.carvedilol (COREG)   5.escitalopram (LEXAPRO  6.mometasone-formoterol (DULERA  7.omeprazole (PRILOSEC) 40   8.traMADol (ULTRAM) 50 MG   ____Fleets enema or Magnesium Citrate as directed.   _x___ Use CHG Soap or sage wipes as directed on instruction sheet   _x___ Use inhalers on the day of surgery and bring to hospital day of surgery  ____ Stop Metformin and Janumet 2 days prior to surgery.    ____ Take 1/2 of usual insulin dose the night before surgery and none on the morning     surgery.   _x___ Follow recommendations from Cardiologist, Pulmonologist or PCP regarding          stopping Aspirin, Coumadin, Pllavix ,Eliquis, Effient, or Pradaxa, and Pletal. Stopped Coumadin today. X____Stop Anti-inflammatories such as Advil, Aleve, Ibuprofen, Motrin, Naproxen, Naprosyn, Goodies powders or aspirin products. OK to take Tylenol and  Celebrex.   _x___ Stop supplements until after surgery.  But may continue Vitamin D, Vitamin B,       and multivitamin.   ____ Bring C-Pap to the hospital.

## 2016-12-16 NOTE — Pre-Procedure Instructions (Signed)
Call Dr Rosita KeaMenz in regard to allergy to PCN.  Antibiotic changed to Clindamycin.

## 2016-12-21 ENCOUNTER — Inpatient Hospital Stay
Admission: RE | Admit: 2016-12-21 | Discharge: 2016-12-24 | DRG: 477 | Disposition: A | Discharge status: Home / Self Care | Payer: Medicare Other | Source: Ambulatory Visit | Attending: Internal Medicine | Admitting: Internal Medicine

## 2016-12-21 ENCOUNTER — Ambulatory Visit: Payer: Medicare Other | Admitting: Anesthesiology

## 2016-12-21 ENCOUNTER — Encounter: Payer: Self-pay | Admitting: *Deleted

## 2016-12-21 ENCOUNTER — Encounter
Admission: RE | Disposition: A | Discharge status: Home / Self Care | Payer: Self-pay | Source: Ambulatory Visit | Attending: Orthopedic Surgery

## 2016-12-21 ENCOUNTER — Ambulatory Visit: Payer: Medicare Other

## 2016-12-21 DIAGNOSIS — E119 Type 2 diabetes mellitus without complications: Secondary | ICD-10-CM | POA: Diagnosis present

## 2016-12-21 DIAGNOSIS — F329 Major depressive disorder, single episode, unspecified: Secondary | ICD-10-CM | POA: Diagnosis present

## 2016-12-21 DIAGNOSIS — Z419 Encounter for procedure for purposes other than remedying health state, unspecified: Secondary | ICD-10-CM

## 2016-12-21 DIAGNOSIS — B20 Human immunodeficiency virus [HIV] disease: Secondary | ICD-10-CM | POA: Diagnosis present

## 2016-12-21 DIAGNOSIS — S22000A Wedge compression fracture of unspecified thoracic vertebra, initial encounter for closed fracture: Secondary | ICD-10-CM | POA: Diagnosis present

## 2016-12-21 DIAGNOSIS — I10 Essential (primary) hypertension: Secondary | ICD-10-CM | POA: Diagnosis present

## 2016-12-21 DIAGNOSIS — I2510 Atherosclerotic heart disease of native coronary artery without angina pectoris: Secondary | ICD-10-CM | POA: Diagnosis not present

## 2016-12-21 DIAGNOSIS — Z888 Allergy status to other drugs, medicaments and biological substances status: Secondary | ICD-10-CM

## 2016-12-21 DIAGNOSIS — K219 Gastro-esophageal reflux disease without esophagitis: Secondary | ICD-10-CM | POA: Diagnosis present

## 2016-12-21 DIAGNOSIS — J441 Chronic obstructive pulmonary disease with (acute) exacerbation: Secondary | ICD-10-CM | POA: Diagnosis present

## 2016-12-21 DIAGNOSIS — M4854XA Collapsed vertebra, not elsewhere classified, thoracic region, initial encounter for fracture: Principal | ICD-10-CM | POA: Diagnosis present

## 2016-12-21 DIAGNOSIS — Z882 Allergy status to sulfonamides status: Secondary | ICD-10-CM

## 2016-12-21 DIAGNOSIS — I739 Peripheral vascular disease, unspecified: Secondary | ICD-10-CM | POA: Diagnosis not present

## 2016-12-21 DIAGNOSIS — E785 Hyperlipidemia, unspecified: Secondary | ICD-10-CM | POA: Diagnosis present

## 2016-12-21 DIAGNOSIS — I252 Old myocardial infarction: Secondary | ICD-10-CM

## 2016-12-21 DIAGNOSIS — R109 Unspecified abdominal pain: Secondary | ICD-10-CM

## 2016-12-21 DIAGNOSIS — R079 Chest pain, unspecified: Secondary | ICD-10-CM | POA: Diagnosis not present

## 2016-12-21 DIAGNOSIS — Z86718 Personal history of other venous thrombosis and embolism: Secondary | ICD-10-CM

## 2016-12-21 DIAGNOSIS — S22059A Unspecified fracture of T5-T6 vertebra, initial encounter for closed fracture: Secondary | ICD-10-CM | POA: Diagnosis not present

## 2016-12-21 DIAGNOSIS — Z886 Allergy status to analgesic agent status: Secondary | ICD-10-CM

## 2016-12-21 DIAGNOSIS — G8918 Other acute postprocedural pain: Secondary | ICD-10-CM

## 2016-12-21 DIAGNOSIS — Z66 Do not resuscitate: Secondary | ICD-10-CM | POA: Diagnosis present

## 2016-12-21 DIAGNOSIS — R52 Pain, unspecified: Secondary | ICD-10-CM

## 2016-12-21 DIAGNOSIS — Z880 Allergy status to penicillin: Secondary | ICD-10-CM

## 2016-12-21 DIAGNOSIS — Z86711 Personal history of pulmonary embolism: Secondary | ICD-10-CM

## 2016-12-21 DIAGNOSIS — S22050A Wedge compression fracture of T5-T6 vertebra, initial encounter for closed fracture: Secondary | ICD-10-CM | POA: Diagnosis not present

## 2016-12-21 DIAGNOSIS — Z7901 Long term (current) use of anticoagulants: Secondary | ICD-10-CM

## 2016-12-21 HISTORY — PX: KYPHOPLASTY: SHX5884

## 2016-12-21 LAB — CBC
HEMATOCRIT: 39.5 % — AB (ref 40.0–52.0)
Hemoglobin: 13.5 g/dL (ref 13.0–18.0)
MCH: 32.8 pg (ref 26.0–34.0)
MCHC: 34.3 g/dL (ref 32.0–36.0)
MCV: 95.8 fL (ref 80.0–100.0)
Platelets: 218 10*3/uL (ref 150–440)
RBC: 4.12 MIL/uL — ABNORMAL LOW (ref 4.40–5.90)
RDW: 13.9 % (ref 11.5–14.5)
WBC: 9.4 10*3/uL (ref 3.8–10.6)

## 2016-12-21 LAB — COMPREHENSIVE METABOLIC PANEL
ALT: 21 U/L (ref 17–63)
ANION GAP: 7 (ref 5–15)
AST: 24 U/L (ref 15–41)
Albumin: 3.7 g/dL (ref 3.5–5.0)
Alkaline Phosphatase: 198 U/L — ABNORMAL HIGH (ref 38–126)
BILIRUBIN TOTAL: 0.6 mg/dL (ref 0.3–1.2)
BUN: 22 mg/dL — AB (ref 6–20)
CO2: 27 mmol/L (ref 22–32)
Calcium: 8.7 mg/dL — ABNORMAL LOW (ref 8.9–10.3)
Chloride: 103 mmol/L (ref 101–111)
Creatinine, Ser: 1.3 mg/dL — ABNORMAL HIGH (ref 0.61–1.24)
GFR calc Af Amer: >60 mL/min (ref >60)
GFR, EST NON AFRICAN AMERICAN: 59 mL/min — AB (ref >60)
Glucose, Bld: 144 mg/dL — ABNORMAL HIGH (ref 65–99)
POTASSIUM: 3.3 mmol/L — AB (ref 3.5–5.1)
Sodium: 137 mmol/L (ref 135–145)
Total Protein: 7.8 g/dL (ref 6.5–8.1)

## 2016-12-21 LAB — GLUCOSE, CAPILLARY
Glucose-Capillary: 101 mg/dL — ABNORMAL HIGH (ref 65–99)
Glucose-Capillary: 106 mg/dL — ABNORMAL HIGH (ref 65–99)
Glucose-Capillary: 149 mg/dL — ABNORMAL HIGH (ref 65–99)

## 2016-12-21 LAB — PROTIME-INR
INR: 1.02
PROTHROMBIN TIME: 13.4 s (ref 11.4–15.2)

## 2016-12-21 LAB — TROPONIN I

## 2016-12-21 LAB — TSH: TSH: 11.054 u[IU]/mL — AB (ref 0.350–4.500)

## 2016-12-21 SURGERY — KYPHOPLASTY
Anesthesia: General | Wound class: Clean

## 2016-12-21 MED ORDER — SODIUM CHLORIDE 0.9 % IV SOLN
INTRAVENOUS | Status: DC
  Administered 2016-12-21: 14:00:00 via INTRAVENOUS

## 2016-12-21 MED ORDER — ONDANSETRON HCL 4 MG/2ML IJ SOLN
INTRAMUSCULAR | Status: AC
  Filled 2016-12-21: qty 2

## 2016-12-21 MED ORDER — METHOCARBAMOL 500 MG PO TABS: 500.0000 mg | ORAL_TABLET | Freq: Four times a day (QID) | ORAL | Status: DC | PRN

## 2016-12-21 MED ORDER — ZOLPIDEM TARTRATE 5 MG PO TABS
5.0000 mg | ORAL_TABLET | Freq: Every day | ORAL | Status: DC
  Administered 2016-12-21 – 2016-12-23 (×3): 5 mg via ORAL
  Filled 2016-12-21 (×3): qty 1

## 2016-12-21 MED ORDER — ATORVASTATIN CALCIUM 10 MG PO TABS
10.0000 mg | ORAL_TABLET | Freq: Every day | ORAL | Status: DC
  Administered 2016-12-22 – 2016-12-23 (×2): 10 mg via ORAL
  Filled 2016-12-21 (×2): qty 1

## 2016-12-21 MED ORDER — IOPAMIDOL (ISOVUE-M 200) INJECTION 41%
INTRAMUSCULAR | Status: AC
  Filled 2016-12-21: qty 20

## 2016-12-21 MED ORDER — TIOTROPIUM BROMIDE MONOHYDRATE 18 MCG IN CAPS
18.0000 ug | ORAL_CAPSULE | Freq: Every day | RESPIRATORY_TRACT | Status: DC
  Administered 2016-12-22 – 2016-12-24 (×3): 18 ug via RESPIRATORY_TRACT
  Filled 2016-12-21: qty 5

## 2016-12-21 MED ORDER — METHOCARBAMOL 1000 MG/10ML IJ SOLN
500.0000 mg | Freq: Four times a day (QID) | INTRAVENOUS | Status: DC | PRN
  Filled 2016-12-21: qty 5

## 2016-12-21 MED ORDER — MOMETASONE FURO-FORMOTEROL FUM 200-5 MCG/ACT IN AERO
2.0000 | INHALATION_SPRAY | Freq: Two times a day (BID) | RESPIRATORY_TRACT | Status: DC
  Administered 2016-12-21 – 2016-12-24 (×6): 2 via RESPIRATORY_TRACT
  Filled 2016-12-21: qty 8.8

## 2016-12-21 MED ORDER — CLINDAMYCIN PHOSPHATE 900 MG/50ML IV SOLN
INTRAVENOUS | Status: AC
  Filled 2016-12-21: qty 50

## 2016-12-21 MED ORDER — ALBUTEROL SULFATE (2.5 MG/3ML) 0.083% IN NEBU
2.5000 mg | INHALATION_SOLUTION | Freq: Three times a day (TID) | RESPIRATORY_TRACT | Status: DC
  Administered 2016-12-21 – 2016-12-22 (×2): 2.5 mg via RESPIRATORY_TRACT
  Filled 2016-12-21 (×2): qty 3

## 2016-12-21 MED ORDER — WARFARIN - PHYSICIAN DOSING INPATIENT
Freq: Every day | Status: DC
  Administered 2016-12-22: 18:00:00

## 2016-12-21 MED ORDER — BUPIVACAINE-EPINEPHRINE (PF) 0.5% -1:200000 IJ SOLN
INTRAMUSCULAR | Status: DC | PRN
  Administered 2016-12-21: 10 mL

## 2016-12-21 MED ORDER — LIDOCAINE HCL (PF) 1 % IJ SOLN
INTRAMUSCULAR | Status: AC
  Filled 2016-12-21: qty 60

## 2016-12-21 MED ORDER — FUROSEMIDE 20 MG PO TABS
20.0000 mg | ORAL_TABLET | Freq: Every day | ORAL | Status: DC
  Administered 2016-12-22 – 2016-12-24 (×3): 20 mg via ORAL
  Filled 2016-12-21 (×4): qty 1

## 2016-12-21 MED ORDER — DOCUSATE SODIUM 100 MG PO CAPS
100.0000 mg | ORAL_CAPSULE | Freq: Two times a day (BID) | ORAL | Status: DC
  Administered 2016-12-21 – 2016-12-24 (×6): 100 mg via ORAL
  Filled 2016-12-21 (×6): qty 1

## 2016-12-21 MED ORDER — WARFARIN SODIUM 3 MG PO TABS: 4.0000 mg | ORAL_TABLET | Freq: Every day | ORAL | Status: DC

## 2016-12-21 MED ORDER — LIDOCAINE HCL 1 % IJ SOLN
INTRAMUSCULAR | Status: DC | PRN
  Administered 2016-12-21: 20 mL

## 2016-12-21 MED ORDER — MIDAZOLAM HCL 2 MG/2ML IJ SOLN
INTRAMUSCULAR | Status: AC
  Filled 2016-12-21: qty 2

## 2016-12-21 MED ORDER — ONDANSETRON HCL 4 MG/2ML IJ SOLN: 4.0000 mg | Freq: Once | INTRAMUSCULAR | Status: DC | PRN

## 2016-12-21 MED ORDER — ADULT MULTIVITAMIN W/MINERALS CH
1.0000 | ORAL_TABLET | Freq: Every day | ORAL | Status: DC
  Administered 2016-12-22 – 2016-12-23 (×2): 1 via ORAL
  Filled 2016-12-21 (×2): qty 1

## 2016-12-21 MED ORDER — CARVEDILOL 12.5 MG PO TABS
12.5000 mg | ORAL_TABLET | Freq: Two times a day (BID) | ORAL | Status: DC
  Administered 2016-12-22 – 2016-12-24 (×5): 12.5 mg via ORAL
  Filled 2016-12-21 (×5): qty 1

## 2016-12-21 MED ORDER — PANTOPRAZOLE SODIUM 40 MG PO TBEC
80.0000 mg | DELAYED_RELEASE_TABLET | Freq: Every day | ORAL | Status: DC
  Administered 2016-12-22 – 2016-12-23 (×2): 80 mg via ORAL
  Filled 2016-12-21 (×3): qty 2

## 2016-12-21 MED ORDER — IOPAMIDOL (ISOVUE-M 200) INJECTION 41%
INTRAMUSCULAR | Status: DC | PRN
  Administered 2016-12-21: 20 mL

## 2016-12-21 MED ORDER — ALBUTEROL SULFATE (2.5 MG/3ML) 0.083% IN NEBU: 3.0000 mL | INHALATION_SOLUTION | Freq: Four times a day (QID) | RESPIRATORY_TRACT | Status: DC | PRN

## 2016-12-21 MED ORDER — MORPHINE SULFATE (PF) 2 MG/ML IV SOLN
1.0000 mg | INTRAVENOUS | Status: DC | PRN
  Administered 2016-12-21: 1 mg via INTRAVENOUS
  Filled 2016-12-21: qty 1

## 2016-12-21 MED ORDER — WARFARIN SODIUM 1 MG PO TABS: 2.0000 mg | ORAL_TABLET | ORAL | Status: DC

## 2016-12-21 MED ORDER — CLINDAMYCIN PHOSPHATE 900 MG/50ML IV SOLN
900.0000 mg | Freq: Once | INTRAVENOUS | Status: AC
  Administered 2016-12-21: 900 mg via INTRAVENOUS

## 2016-12-21 MED ORDER — FENTANYL CITRATE (PF) 100 MCG/2ML IJ SOLN: 25.0000 ug | INTRAMUSCULAR | Status: DC | PRN

## 2016-12-21 MED ORDER — SODIUM CHLORIDE 0.9 % IV SOLN
INTRAVENOUS | Status: DC
  Administered 2016-12-21 (×2): via INTRAVENOUS

## 2016-12-21 MED ORDER — POTASSIUM CHLORIDE CRYS ER 10 MEQ PO TBCR
10.0000 meq | EXTENDED_RELEASE_TABLET | Freq: Every day | ORAL | Status: DC
  Administered 2016-12-22 – 2016-12-24 (×3): 10 meq via ORAL
  Filled 2016-12-21 (×3): qty 1

## 2016-12-21 MED ORDER — ONDANSETRON HCL 4 MG PO TABS: 4.0000 mg | ORAL_TABLET | Freq: Four times a day (QID) | ORAL | Status: DC | PRN

## 2016-12-21 MED ORDER — FENTANYL CITRATE (PF) 100 MCG/2ML IJ SOLN
INTRAMUSCULAR | Status: DC | PRN
  Administered 2016-12-21 (×2): 50 ug via INTRAVENOUS

## 2016-12-21 MED ORDER — PROPOFOL 10 MG/ML IV BOLUS
INTRAVENOUS | Status: AC
  Filled 2016-12-21: qty 40

## 2016-12-21 MED ORDER — NITROGLYCERIN 0.4 MG SL SUBL
0.4000 mg | SUBLINGUAL_TABLET | SUBLINGUAL | Status: DC | PRN
  Administered 2016-12-21 (×3): 0.4 mg via SUBLINGUAL
  Filled 2016-12-21 (×3): qty 1

## 2016-12-21 MED ORDER — BUPIVACAINE-EPINEPHRINE (PF) 0.5% -1:200000 IJ SOLN
INTRAMUSCULAR | Status: AC
  Filled 2016-12-21: qty 30

## 2016-12-21 MED ORDER — FENTANYL CITRATE (PF) 100 MCG/2ML IJ SOLN
INTRAMUSCULAR | Status: AC
  Filled 2016-12-21: qty 2

## 2016-12-21 MED ORDER — MAGNESIUM OXIDE 400 (241.3 MG) MG PO TABS
400.0000 mg | ORAL_TABLET | Freq: Every day | ORAL | Status: DC
  Administered 2016-12-22 – 2016-12-24 (×3): 400 mg via ORAL
  Filled 2016-12-21 (×3): qty 1

## 2016-12-21 MED ORDER — LORATADINE 10 MG PO TABS
10.0000 mg | ORAL_TABLET | Freq: Every day | ORAL | Status: DC
  Administered 2016-12-22 – 2016-12-24 (×3): 10 mg via ORAL
  Filled 2016-12-21 (×3): qty 1

## 2016-12-21 MED ORDER — ONDANSETRON HCL 4 MG/2ML IJ SOLN: 4.0000 mg | Freq: Four times a day (QID) | INTRAMUSCULAR | Status: DC | PRN

## 2016-12-21 MED ORDER — MIRTAZAPINE 15 MG PO TABS
15.0000 mg | ORAL_TABLET | Freq: Every evening | ORAL | Status: DC
  Administered 2016-12-22 – 2016-12-23 (×2): 15 mg via ORAL
  Filled 2016-12-21 (×2): qty 1

## 2016-12-21 MED ORDER — MIDAZOLAM HCL 2 MG/2ML IJ SOLN
INTRAMUSCULAR | Status: DC | PRN
  Administered 2016-12-21 (×2): 1 mg via INTRAVENOUS

## 2016-12-21 MED ORDER — PROPOFOL 500 MG/50ML IV EMUL
INTRAVENOUS | Status: DC | PRN
  Administered 2016-12-21: 25 ug/kg/min via INTRAVENOUS

## 2016-12-21 MED ORDER — CLINDAMYCIN PHOSPHATE 600 MG/50ML IV SOLN
INTRAVENOUS | Status: AC
  Filled 2016-12-21: qty 50

## 2016-12-21 MED ORDER — ESCITALOPRAM OXALATE 10 MG PO TABS
20.0000 mg | ORAL_TABLET | Freq: Every day | ORAL | Status: DC
  Administered 2016-12-22 – 2016-12-24 (×3): 20 mg via ORAL
  Filled 2016-12-21 (×3): qty 2

## 2016-12-21 MED ORDER — METOCLOPRAMIDE HCL 10 MG PO TABS: 5.0000 mg | ORAL_TABLET | Freq: Three times a day (TID) | ORAL | Status: DC | PRN

## 2016-12-21 MED ORDER — LINACLOTIDE 72 MCG PO CAPS
72.0000 ug | ORAL_CAPSULE | Freq: Every day | ORAL | Status: DC
  Administered 2016-12-22 – 2016-12-24 (×3): 72 ug via ORAL
  Filled 2016-12-21 (×5): qty 1

## 2016-12-21 MED ORDER — HYDROCODONE-ACETAMINOPHEN 5-325 MG PO TABS
1.0000 | ORAL_TABLET | ORAL | Status: DC | PRN
  Administered 2016-12-22: 1 via ORAL
  Filled 2016-12-21: qty 1

## 2016-12-21 MED ORDER — METOCLOPRAMIDE HCL 5 MG/ML IJ SOLN: 5.0000 mg | Freq: Three times a day (TID) | INTRAMUSCULAR | Status: DC | PRN

## 2016-12-21 MED ORDER — WARFARIN SODIUM 3 MG PO TABS
4.0000 mg | ORAL_TABLET | ORAL | Status: DC
  Administered 2016-12-21 – 2016-12-23 (×3): 4 mg via ORAL
  Filled 2016-12-21 (×3): qty 1

## 2016-12-21 MED ORDER — PROPOFOL 10 MG/ML IV BOLUS
INTRAVENOUS | Status: DC | PRN
  Administered 2016-12-21: 40 mg via INTRAVENOUS

## 2016-12-21 MED ORDER — ABACAVIR-DOLUTEGRAVIR-LAMIVUD 600-50-300 MG PO TABS
1.0000 | ORAL_TABLET | Freq: Every day | ORAL | Status: DC
  Administered 2016-12-22 – 2016-12-24 (×3): 1 via ORAL
  Filled 2016-12-21 (×4): qty 1

## 2016-12-21 MED ORDER — CALCIUM CARBONATE-VITAMIN D 500-200 MG-UNIT PO TABS
1.0000 | ORAL_TABLET | Freq: Every day | ORAL | Status: DC
Start: 2016-12-22 — End: 2016-12-24
  Administered 2016-12-23: 13:00:00 1 via ORAL
  Filled 2016-12-21: qty 1

## 2016-12-21 SURGICAL SUPPLY — 17 items
CEMENT KYPHON CX01A KIT/MIXER (Cement) ×2 IMPLANT
DERMABOND ADVANCED (GAUZE/BANDAGES/DRESSINGS) ×1
DERMABOND ADVANCED .7 DNX12 (GAUZE/BANDAGES/DRESSINGS) ×1 IMPLANT
DEVICE BIOPSY BONE KYPHX (INSTRUMENTS) ×2 IMPLANT
DRAPE C-ARM XRAY 36X54 (DRAPES) ×2 IMPLANT
DURAPREP 26ML APPLICATOR (WOUND CARE) ×2 IMPLANT
GLOVE SURG SYN 9.0  PF PI (GLOVE) ×1
GLOVE SURG SYN 9.0 PF PI (GLOVE) ×1 IMPLANT
GOWN SRG 2XL LVL 4 RGLN SLV (GOWNS) ×1 IMPLANT
GOWN STRL NON-REIN 2XL LVL4 (GOWNS) ×1
GOWN STRL REUS W/ TWL LRG LVL3 (GOWN DISPOSABLE) ×1 IMPLANT
GOWN STRL REUS W/TWL LRG LVL3 (GOWN DISPOSABLE) ×1
PACK KYPHOPLASTY (MISCELLANEOUS) ×2 IMPLANT
STRAP SAFETY BODY (MISCELLANEOUS) ×2 IMPLANT
TRAY KYPHOPAK 15/2 EXPRESS (KITS) ×2 IMPLANT
TRAY KYPHOPAK 15/3 EXPRESS 1ST (MISCELLANEOUS) IMPLANT
TRAY KYPHOPAK 20/3 EXPRESS 1ST (MISCELLANEOUS) IMPLANT

## 2016-12-21 NOTE — H&P (Signed)
Reviewed paper H+P, will be scanned into chart. No changes noted. Patient examined  

## 2016-12-21 NOTE — Transfer of Care (Signed)
Immediate Anesthesia Transfer of Care Note  Patient: Brian Hampton  Procedure(s) Performed: Procedure(s): KYPHOPLASTY (N/A)  Patient Location: PACU  Anesthesia Type:General  Level of Consciousness: awake and alert   Airway & Oxygen Therapy: Patient Spontanous Breathing and Patient connected to nasal cannula oxygen  Post-op Assessment: Report given to RN and Post -op Vital signs reviewed and stable  Post vital signs: Reviewed and stable  Last Vitals:  Vitals:   12/21/16 1334  BP: (!) 164/108  Pulse: 94  Resp: 16  Temp: 36.9 C    Last Pain:  Vitals:   12/21/16 1334  TempSrc: Oral  PainSc: 9          Complications: No apparent anesthesia complications

## 2016-12-21 NOTE — OR Nursing (Signed)
Dr Rosita KeaMenz reviewed labs.

## 2016-12-21 NOTE — Consult Note (Addendum)
SURGICAL CONSULTATION NOTE (initial) - cpt: [phone]  HISTORY OF PRESENT ILLNESS (HPI):  61 y.o. male recently [facility] to [facility] ED (11/26/2016) for back, chest, and LUQ abdominal pain and, following workup including CTA chest and upper abdomen (aorta), was diagnosed with a newly diagnosed T6 compression fracture to which patient's pain was attributed and for which he followed up with orthopedic surgeon Dr. Rosita KeaMenz (12/02/2016), who advised Kyphoplasty, which was performed this afternoon and completed just before 5 pm. Since surgery, patient has complained of Left back and flank pain radiating to his Left chest and LUQ abdomen. Considering patient's extensive cardiac history, his complaints of "chest pain" prompted medical consultation for cardiac evaluation. Upon evaluation, medical physician requested surgical consultation for abdominal distention and pain. Patient denies any abdominal distention, patting his abdomen and saying "this is me" in reference to his abdomen. Patient also reports passing much flatus since surgery with only mild nausea immediately after surgery that has resolved. Patient denies Right-side or lower abdominal pain or fever/chills, describes his CP as reproducible with palpation, and attributes his SOB to pain with deep breaths. After having received a dose of IV morphine, patient reported his pain was about the same, but he said he could breath and "distract" himself from the pain easier.  Surgery is consulted by medical physician Dr. Emmit PomfretHugelmeyer in this context for evaluation and management of abdominal pain and distention.  PAST MEDICAL HISTORY (PMH):  Past Medical History:  Diagnosis Date  . Anemia   . Asthma   . Cataracts, bilateral    worse in Rt eye  . Collagen vascular disease (HCC)   . COPD (chronic obstructive pulmonary disease) (HCC)   . Coronary artery disease   . Depression   . DVT (deep venous thrombosis) (HCC)   . Dysrhythmia   . Emphysema/COPD (HCC)   .  Environmental and seasonal allergies   . H/O blood clots   . Heart murmur   . HIV (human immunodeficiency virus infection) (HCC)   . Hypertension   . Leaky heart valve    x 3  . Lung mass   . Myocardial infarction (HCC)    in 2000  . Pneumonia    year ago  . Pulmonary emboli (HCC)   . Type 2 diabetes mellitus (HCC)      PAST SURGICAL HISTORY (PSH):  Past Surgical History:  Procedure Laterality Date  . ANKLE ARTHROSCOPY    . IVC FILTER INSERTION    . PERIPHERAL VASCULAR CATHETERIZATION N/A 03/16/2016   Procedure: IVC Filter Insertion;  Surgeon: Renford DillsGregory G Schnier, MD;  Location: [facility] INVASIVE CV LAB;  Service: Cardiovascular;  Laterality: N/A;  . SINUS EXPLORATION    . WRIST ARTHROSCOPY Right      MEDICATIONS:  Prior to Admission medications   Medication Sig Start Date End Date Taking? Authorizing Provider  abacavir-dolutegravir-lamiVUDine (TRIUMEQ) 600-50-300 MG tablet Take 1 tablet by mouth daily after breakfast. Reported on 01/30/2016 01/27/16  Yes [provider]  albuterol (PROVENTIL HFA;VENTOLIN HFA) 108 (90 BASE) MCG/ACT inhaler Inhale 2 puffs into the lungs every 6 (six) hours as needed for wheezing or shortness of breath.   Yes [provider]  albuterol (PROVENTIL) (2.5 MG/3ML) 0.083% nebulizer solution Take 2.5 mg by nebulization 3 (three) times daily.    Yes [provider]  atorvastatin (LIPITOR) 10 MG tablet Take 10 mg by mouth daily after breakfast. 11/16/16  Yes [provider]  Calcium Carbonate-Vitamin D (CALCIUM 600+D) 600-400 MG-UNIT tablet Take 1 tablet by mouth  daily after breakfast.    Yes [provider]  carvedilol (COREG) 12.5 MG tablet Take 12.5 mg by mouth 2 (two) times daily with a meal.   Yes [provider]  cetirizine (ZYRTEC) 10 MG tablet Take 10 mg by mouth daily as needed for allergies.    Yes [provider]  EPINEPHrine (EPIPEN 2-PAK) 0.3 mg/0.3 mL IJ SOAJ injection Inject 0.3 mg into the  muscle once as needed (for severe allergic reaction).   Yes [provider]  escitalopram (LEXAPRO) 20 MG tablet Take 20 mg by mouth daily after breakfast.    Yes [provider]  furosemide (LASIX) 20 MG tablet Take 20 mg by mouth daily.  10/22/16  Yes [provider]  HYDROcodone-acetaminophen (NORCO) 5-325 MG tablet Take 1 tablet by mouth every 4 (four) hours as needed for moderate pain. 11/26/16  Yes Willy Eddy, MD  LINZESS 72 MCG capsule Take 72 mcg by mouth daily after breakfast. 11/10/16  Yes [provider]  magnesium oxide (MAG-OX) 400 MG tablet Take 400 mg by mouth daily after breakfast.   Yes [provider]  meloxicam (MOBIC) 7.5 MG tablet Take 7.5 mg by mouth daily as needed for pain.    Yes [provider]  mirtazapine (REMERON) 15 MG tablet Take 15 mg by mouth every evening.    Yes [provider]  mometasone-formoterol (DULERA) 200-5 MCG/ACT AERO Inhale 2 puffs into the lungs 2 (two) times daily as needed for wheezing or shortness of breath.    Yes [provider]  Multiple Vitamin (MULTIVITAMIN WITH MINERALS) TABS tablet Take 1 tablet by mouth daily. ONE-A-DAY MULTIVITAMIN 50+   Yes [provider]  nitroGLYCERIN (NITROSTAT) 0.4 MG SL tablet Place 0.4 mg under the tongue every 5 (five) minutes as needed for chest pain.   Yes [provider]  omeprazole (PRILOSEC) 40 MG capsule Take 40 mg by mouth daily after breakfast. 11/16/16  Yes [provider]  Potassium Chloride CR (MICRO-K) 8 MEQ CPCR capsule CR Take 1 capsule by mouth daily after breakfast. 11/15/16  Yes [provider]  tiotropium (SPIRIVA) 18 MCG inhalation capsule Place 18 mcg into inhaler and inhale daily after breakfast.    Yes [provider]  traMADol (ULTRAM) 50 MG tablet Take 1 tablet (50 mg total) by mouth every 6 (six) hours as needed. Patient taking differently: Take 50 mg by mouth 2 (two) times  daily.  03/17/16  Yes Katharina Caper, MD  warfarin (COUMADIN) 4 MG tablet Take 4 mg by mouth daily. Take 1/2 tablet (2 MG) on Friday, Saturday, & Take 1 tablet (4 MG) on Sunday,  Monday, Tuesday, Wednesday, & Thursday   Yes [provider]  zolpidem (AMBIEN) 5 MG tablet Take 5 mg by mouth at bedtime. 10/15/16  Yes [provider]  enoxaparin (LOVENOX) 150 MG/ML injection Inject 0.44 mLs (65 mg total) into the skin every 12 (twelve) hours. 03/17/16   Katharina Caper, MD  levalbuterol (XOPENEX HFA) 45 MCG/ACT inhaler Inhale 1 puff into the lungs 3 (three) times daily. Patient not taking: Reported on 12/15/2016 05/12/16   Katha Hamming, MD  senna-docusate (SENOKOT-S) 8.6-50 MG tablet Take 1 tablet by mouth at bedtime as needed for mild constipation. Patient not taking: Reported on 11/26/2016 01/31/16   Katharina Caper, MD     ALLERGIES:  Allergies  Allergen Reactions  . Aspirin Anaphylaxis  . Bee Venom Anaphylaxis  . Penicillins Anaphylaxis and Other (See Comments)    Has  patient had a PCN reaction causing immediate rash, facial/tongue/throat swelling, SOB or lightheadedness with hypotension: Yes Has patient had a PCN reaction causing severe rash involving mucus membranes or skin necrosis: No Has patient had a PCN reaction that required hospitalization No Has patient had a PCN reaction occurring within the last 10 years: Yes If all of the above answers are "NO", then may proceed with Cephalosporin use.  . Prednisone Anaphylaxis  . Sulfa Antibiotics Anaphylaxis  . Sulfasalazine Anaphylaxis  . Theophyllines Anaphylaxis  . Theophylline Swelling     SOCIAL HISTORY:  Social History   Social History  . Marital status: Widowed    Spouse name: N/A  . Number of children: N/A  . Years of education: N/A   Occupational History  . Not on file.   Social History Main Topics  . Smoking status: Former Smoker    Quit date: 12/16/2001  . Smokeless tobacco: Never Used  . Alcohol use  No  . Drug use: No  . Sexual activity: Not on file   Other Topics Concern  . Not on file   Social History Narrative  . No narrative on file    The patient currently resides (home / rehab facility / nursing home): Home  The patient normally is (ambulatory / bedbound): Ambulatory   FAMILY HISTORY:  Family History  Problem Relation Age of Onset  . CAD Unknown     REVIEW OF SYSTEMS:  Constitutional: denies weight loss, fever, chills, or sweats  Eyes: denies any other vision changes, history of eye injury  ENT: denies sore throat, hearing problems  Respiratory: denies shortness of breath, wheezing  Cardiovascular: denies chest pain, palpitations  Gastrointestinal: abdominal pain, N/V, and bowel function as per HPI Genitourinary: denies burning with urination or urinary frequency Musculoskeletal: denies any other joint pains or cramps except as per HPI Skin: denies any other rashes or skin discolorations  Neurological: denies any other headache, dizziness, weakness  Psychiatric: denies any other depression, anxiety   All other review of systems were negative   VITAL SIGNS:  Temp:  [97.5 F (36.4 C)-98.9 F (37.2 C)] 98.7 F (37.1 C) (05/15 2221) Pulse Rate:  [77-94] 93 (05/15 2221) Resp:  [15-21] 20 (05/15 2221) BP: (125-164)/(80-108) 134/83 (05/15 2221) SpO2:  [94 %-100 %] 100 % (05/15 2221) Weight:  [185 lb (83.9 kg)] 185 lb (83.9 kg) (05/15 1334)     Height: 5\' 5"  (165.1 cm) Weight: 185 lb (83.9 kg) BMI (Calculated): 30.8   INTAKE/OUTPUT:  This shift: No intake/output data recorded.  Last 2 shifts: @IOLAST2SHIFTS @   PHYSICAL EXAM:  Constitutional:  -- Obese body habitus  -- Awake, alert, and oriented x3  Eyes:  -- Pupils equally round and reactive to light  -- No scleral icterus  Ear, nose, and throat:  -- No jugular venous distension  Pulmonary:  -- No crackles  -- Equal breath sounds bilaterally -- Breathing non-labored at rest Cardiovascular:  -- S1, S2  present  -- No pericardial rubs Gastrointestinal:  -- Abdomen protuberant (baseline per patient) and soft with significant Left back/flank pain > LUQ/Left lower chest > LLQ/epigastric pain and Right abdomen + suprapubic area non-tender, no guarding/rebound; also focal mild-/moderate- tenderness to palpation over B/L upper abdominal ecchymosis s/p heparin injections for DVT prophylaxis -- No abdominal masses appreciated, pulsatile or otherwise  Musculoskeletal and Integumentary:  -- Wounds or skin discoloration: None except as described above (GI) -- Extremities: B/L UE and LE limited secondary to pain, hands and feet warm  Neurologic:  -- Motor function: intact and symmetric -- Sensation: intact and symmetric  Labs:  CBC:  Lab Results  Component Value Date   WBC 9.4 12/21/2016   RBC 4.12 (L) 12/21/2016   HEMOGLOBIN 12.5 (L) 01/14/2016   BMP:  Lab Results  Component Value Date   GLUCOSE 144 (H) 12/21/2016   GLUCOSE 138 (H) 10/02/2011   CO2 27 12/21/2016   CO2 22 10/02/2011   BUN 22 (H) 12/21/2016   BUN 16 10/02/2011   CREATININE 1.30 (H) 12/21/2016   CREATININE 0.97 10/02/2011   CALCIUM 8.7 (L) 12/21/2016   CALCIUM 8.8 10/02/2011   Coagulation:  Lab Results  Component Value Date   INR 1.02 12/21/2016   APTT 124 (H) 03/17/2016     Imaging studies:  CTA Chest Aorta with/without Contrast (11/26/2016) Upper Abdomen: Negative.  IVC filter in place.  Musculoskeletal: Healed/healing rib fractures in the lower right ribs laterally. Old healed rib fractures on the left. Old T8 compression fracture. T6 compression fracture with loss of height of about 20% at the superior endplate. Relative sclerosis could indicate that this is a recent fracture. Incidental chronic failure of separation at T4 and T5. Old minimal superior endplate depression at T3.  Assessment/Plan: (ICD-10's: G25.18) 61 y.o. 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   - pain control as per operating surgeon   - monitor abdominal exam and bowel function   - no evidence to suggest intra-abdominal pathology  - medical workup and management as per medical consultant and primary team  - will follow-up laboratory studies as ordered by primary and medical teams  - activity and disposition as per primary team  - DVT prophylaxis  All of the above findings and recommendations were discussed with the patient and his RN, and all of patient's questions were answered to his expressed satisfaction.  Thank you for the opportunity to participate in this patient's care.   -- Scherrie Gerlach Earlene Plater, MD, RPVI [place]: [facility] Surgery - Partnering for exceptional care. Office: [phone]

## 2016-12-21 NOTE — Anesthesia Preprocedure Evaluation (Signed)
Anesthesia Evaluation  Patient identified by MRN, date of birth, ID band Patient awake    Reviewed: Allergy & Precautions, H&P , NPO status , Patient's Chart, lab work & pertinent test results, reviewed documented beta blocker date and time   History of Anesthesia Complications Negative for: history of anesthetic complications  Airway Mallampati: III  TM Distance: >3 FB Neck ROM: full    Dental  (+) Edentulous Upper, Edentulous Lower   Pulmonary shortness of breath and with exertion, asthma , neg sleep apnea, COPD,  COPD inhaler, neg recent URI, former smoker,           Cardiovascular Exercise Tolerance: Good hypertension, (-) angina+ CAD, + Past MI and + Peripheral Vascular Disease  (-) Cardiac Stents and (-) CABG negative cardio ROS  + dysrhythmias + Valvular Problems/Murmurs      Neuro/Psych neg Seizures PSYCHIATRIC DISORDERS (Depression) TIA   GI/Hepatic Neg liver ROS, GERD  ,  Endo/Other  diabetes (borderline)  Renal/GU negative Renal ROS  negative genitourinary   Musculoskeletal   Abdominal   Peds  Hematology  (+) Blood dyscrasia, anemia ,   Anesthesia Other Findings Past Medical History: No date: Anemia No date: Asthma No date: Cataracts, bilateral     Comment: worse in Rt eye No date: Collagen vascular disease (HCC) No date: COPD (chronic obstructive pulmonary disease) (* No date: Coronary artery disease No date: Depression No date: DVT (deep venous thrombosis) (HCC) No date: Dysrhythmia No date: Emphysema/COPD (HCC) No date: Environmental and seasonal allergies No date: H/O blood clots No date: Heart murmur No date: HIV (human immunodeficiency virus infection) (* No date: Hypertension No date: Leaky heart valve     Comment: x 3 No date: Lung mass No date: Myocardial infarction (HCC)     Comment: in 2000 No date: Pneumonia     Comment: year ago No date: Pulmonary emboli (HCC) No date: Type 2  diabetes mellitus (HCC)   Reproductive/Obstetrics negative OB ROS                             Anesthesia Physical Anesthesia Plan  ASA: III  Anesthesia Plan: General   Post-op Pain Management:    Induction:   Airway Management Planned:   Additional Equipment:   Intra-op Plan:   Post-operative Plan:   Informed Consent: I have reviewed the patients History and Physical, chart, labs and discussed the procedure including the risks, benefits and alternatives for the proposed anesthesia with the patient or authorized representative who has indicated his/her understanding and acceptance.   Dental Advisory Given  Plan Discussed with: Anesthesiologist, CRNA and Surgeon  Anesthesia Plan Comments:         Anesthesia Quick Evaluation

## 2016-12-21 NOTE — Progress Notes (Addendum)
Pt. C/o chest pain. Stat ekg ordered and hospitalist consult ordered per Dr. Joice LoftsPoggi.

## 2016-12-21 NOTE — Progress Notes (Signed)
Prime dr. Paged 3 times for stat consult without any response.

## 2016-12-21 NOTE — Anesthesia Post-op Follow-up Note (Cosign Needed)
Anesthesia QCDR form completed.        

## 2016-12-21 NOTE — Op Note (Signed)
12/21/2016  5:10 PM  PATIENT:  Brian Hampton  61 y.o. male  PRE-OPERATIVE DIAGNOSIS:  CLOSED WEDGE COMPRESSION FRACTURE OF SIXTH THORACIC VERTEBRA  POST-OPERATIVE DIAGNOSIS:  CLOSED WEDGE COMPRESSION FRACTURE OF SIXTH THORACIC VERTEBRA  PROCEDURE:  Procedure(s): KYPHOPLASTY (N/A)  SURGEON: Laurene Footman, MD  ASSISTANTS: None  ANESTHESIA:   local and MAC  EBL:  Total I/O In: 400 [I.V.:400] Out: 0   BLOOD ADMINISTERED:none  DRAINS: none   LOCAL MEDICATIONS USED:  MARCAINE    and XYLOCAINE   SPECIMEN:  Source of Specimen:  T6 vertebral body  DISPOSITION OF SPECIMEN:  PATHOLOGY  COUNTS:  YES  TOURNIQUET:  * No tourniquets in log *  IMPLANTS: Bone cement  DICTATION: .Dragon Dictation patient was brought the operating room and after adequate sedation was given, he was placed prone on the table. C-arm was brought in and good visualization of the T6 level was obtained in both AP and lateral projections. Appropriate patient identification and timeout procedures were completed. Local anesthetic was infiltrated on both sides at T6 on the right and left sides and a subcutaneous fashion and the back was then prepped and draped in sterile fashion. Repeat timeout procedure was carried out. Spinal needle was then used on the right side getting down to the pedicle and a 50-50 mix of 1% Xylocaine and half percent Sensorcaine with epinephrine was infiltrated along the tract and allowed to set. After about a minute a small incision was made and the trochars advanced into the vertebral body care being taken to remain lateral to the medial wall the pedicle until the posterior wall of the vertebral body administered. Biopsy was then obtained and the bone was quite sclerotic. Drilling was carried out followed by insertion of a balloon and there is high-pressure in about a cc and a half of inflation obtained. Cement was then inserted and went from superior to inferior and came, across the superior  border of the vertebral body. There did not appear to be extravasation after the cement was set the trochars removed and permanent C-arm views obtained. Dermabond was used to close the skin followed by a Band-Aid  PLAN OF CARE: Admit for overnight observation  PATIENT DISPOSITION:  PACU - hemodynamically stable.

## 2016-12-21 NOTE — Anesthesia Postprocedure Evaluation (Signed)
Anesthesia Post Note  Patient: Brian Hampton  Procedure(s) Performed: Procedure(s) (LRB): KYPHOPLASTY (N/A)  Patient location during evaluation: PACU Anesthesia Type: General Level of consciousness: awake and alert Pain management: pain level controlled Vital Signs Assessment: post-procedure vital signs reviewed and stable Respiratory status: spontaneous breathing, nonlabored ventilation, respiratory function stable and patient connected to nasal cannula oxygen Cardiovascular status: blood pressure returned to baseline and stable Postop Assessment: no signs of nausea or vomiting Anesthetic complications: no     Last Vitals:  Vitals:   12/21/16 1759 12/21/16 1815  BP: (!) 133/96 (!) 133/94  Pulse: 79 84  Resp: (!) 21 16  Temp:  36.7 C    Last Pain:  Vitals:   12/21/16 1828  TempSrc:   PainSc: 0-No pain                 Cleda MccreedyJoseph K Tavi Hoogendoorn

## 2016-12-22 ENCOUNTER — Encounter: Payer: Self-pay | Admitting: Orthopedic Surgery

## 2016-12-22 ENCOUNTER — Ambulatory Visit: Payer: Medicare Other

## 2016-12-22 DIAGNOSIS — R109 Unspecified abdominal pain: Secondary | ICD-10-CM

## 2016-12-22 DIAGNOSIS — R079 Chest pain, unspecified: Secondary | ICD-10-CM | POA: Diagnosis not present

## 2016-12-22 DIAGNOSIS — J441 Chronic obstructive pulmonary disease with (acute) exacerbation: Secondary | ICD-10-CM | POA: Diagnosis not present

## 2016-12-22 DIAGNOSIS — R1084 Generalized abdominal pain: Secondary | ICD-10-CM | POA: Diagnosis not present

## 2016-12-22 DIAGNOSIS — R1012 Left upper quadrant pain: Secondary | ICD-10-CM

## 2016-12-22 DIAGNOSIS — B20 Human immunodeficiency virus [HIV] disease: Secondary | ICD-10-CM | POA: Diagnosis not present

## 2016-12-22 DIAGNOSIS — R197 Diarrhea, unspecified: Secondary | ICD-10-CM | POA: Diagnosis not present

## 2016-12-22 DIAGNOSIS — K5730 Diverticulosis of large intestine without perforation or abscess without bleeding: Secondary | ICD-10-CM | POA: Diagnosis not present

## 2016-12-22 LAB — GLUCOSE, CAPILLARY
GLUCOSE-CAPILLARY: 122 mg/dL — AB (ref 65–99)
Glucose-Capillary: 95 mg/dL (ref 65–99)
Glucose-Capillary: 159 mg/dL — ABNORMAL HIGH (ref 65–99)

## 2016-12-22 LAB — LIPID PANEL
CHOLESTEROL: 163 mg/dL (ref 0–200)
HDL: 40 mg/dL — ABNORMAL LOW (ref >40)
LDL Cholesterol: 85 mg/dL (ref 0–99)
Total CHOL/HDL Ratio: 4.1 RATIO
Triglycerides: 190 mg/dL — ABNORMAL HIGH (ref <150)
VLDL: 38 mg/dL (ref 0–40)

## 2016-12-22 LAB — PROTIME-INR
INR: 0.97
Prothrombin Time: 12.9 seconds (ref 11.4–15.2)

## 2016-12-22 MED ORDER — ALBUTEROL SULFATE (2.5 MG/3ML) 0.083% IN NEBU: 3.0000 mL | INHALATION_SOLUTION | Freq: Four times a day (QID) | RESPIRATORY_TRACT | Status: DC

## 2016-12-22 MED ORDER — IPRATROPIUM-ALBUTEROL 0.5-2.5 (3) MG/3ML IN SOLN
3.0000 mL | Freq: Four times a day (QID) | RESPIRATORY_TRACT | Status: DC
  Administered 2016-12-22 – 2016-12-24 (×7): 3 mL via RESPIRATORY_TRACT
  Filled 2016-12-22 (×8): qty 3

## 2016-12-22 MED ORDER — IOPAMIDOL (ISOVUE-300) INJECTION 61%
15.0000 mL | INTRAVENOUS | Status: AC
  Administered 2016-12-22 (×2): 15 mL via ORAL

## 2016-12-22 MED ORDER — METHYLPREDNISOLONE SODIUM SUCC 40 MG IJ SOLR
40.0000 mg | Freq: Every day | INTRAMUSCULAR | Status: DC
  Administered 2016-12-22 – 2016-12-24 (×3): 40 mg via INTRAVENOUS
  Filled 2016-12-22 (×3): qty 1

## 2016-12-22 MED ORDER — FUROSEMIDE 10 MG/ML IJ SOLN
40.0000 mg | Freq: Once | INTRAMUSCULAR | Status: AC
  Administered 2016-12-22: 40 mg via INTRAVENOUS

## 2016-12-22 NOTE — Progress Notes (Signed)
CC: Left back pain Subjective: This patient status post kyphoplasty T6. He describes left chest pain central chest pain left back pain and left lower quadrant pain he has no nausea vomiting and is tolerating a regular diet he's not having bowel movements. He is passing gas denies melena hematochezia denies fevers or chills. His pain is no worse today than it was yesterday and when questioning he states it's been there for at least 3 weeks. It is not acute in nature. His family member became somewhat angry with him that he had not told anybody that it been present for so long.  Objective: Vital signs in last 24 hours: Temp:  [97 F (36.1 C)-98.9 F (37.2 C)] 98.5 F (36.9 C) (05/16 0815) Pulse Rate:  [77-93] 84 (05/16 0815) Resp:  [15-21] 16 (05/16 0815) BP: (125-153)/(72-106) 138/85 (05/16 0815) SpO2:  [93 %-100 %] 96 % (05/16 1451) Last BM Date: 12/20/16  Intake/Output from previous day: 05/15 0701 - 05/16 0700 In: 903.3 [I.V.:903.3] Out: 350 [Urine:350] Intake/Output this shift: Total I/O In: 524.2 [P.O.:240; I.V.:284.2] Out: -   Physical exam:  Awake alert and oriented abdomen is soft and minimally tender in the left lower quadrant and we tender in the left CVA area no acute distress vital signs are reviewed and stable afebrile  Lab Results: CBC   Recent Labs  12/21/16 2251  WBC 9.4  HGB 13.5  HCT 39.5*  PLT 218   BMET  Recent Labs  12/21/16 2251  NA 137  K 3.3*  CL 103  CO2 27  GLUCOSE 144*  BUN 22*  CREATININE 1.30*  CALCIUM 8.7*   PT/INR  Recent Labs  12/21/16 1400 12/22/16 0504  LABPROT 13.4 12.9  INR 1.02 0.97   ABG No results for input(s): PHART, HCO3 in the last 72 hours.  Invalid input(s): PCO2, PO2  Studies/Results: Ct Abdomen Pelvis Wo Contrast  Result Date: 12/22/2016 CLINICAL DATA:  Abdominal pain.  Kyphoplasty earlier this day. EXAM: CT ABDOMEN AND PELVIS WITHOUT CONTRAST TECHNIQUE: Multidetector CT imaging of the abdomen and  pelvis was performed following the standard protocol without IV contrast. COMPARISON:  Abdominal CT 01/31/2016 FINDINGS: Lower chest: Linear atelectasis or scarring at the lung bases. Enteric contrast in the distal esophagus. Hepatobiliary: No focal liver abnormality is seen. No gallstones, gallbladder wall thickening, or biliary dilatation. Pancreas: No ductal dilatation or inflammation. Spleen: Normal in size without focal abnormality. Adrenals/Urinary Tract: Normal adrenal glands. No hydronephrosis or perinephric edema. There is renal parenchymal thinning. No urolithiasis. Urinary bladder is physiologically distended without bladder wall thickening. Stomach/Bowel: Stomach distended with enteric contrast. No gastric wall thickening. No small bowel obstruction or inflammation, enteric contrast reaches the colon. Normal appendix. Small volume of colonic stool without colonic wall thickening. Diverticulosis from the splenic flexure distally. No acute diverticulitis. Vascular/Lymphatic: Mild aortic atherosclerosis without aneurysm. Infrarenal IVC filter in place. No intra-abdominal or pelvic adenopathy. Reproductive: Prostate is unremarkable. Other: No free air, free fluid, or intra-abdominal fluid collection. Fat within both inguinal canals. Tiny fat containing umbilical hernia. Musculoskeletal: T8 compression fracture is chronic as seen on thoracic spine MRI 12/02/2016. The treated T6 kyphoplasty is not included in the field of view. Multilevel degenerative disc disease and facet arthropathy throughout the lumbar spine. Fractures of right anterior ribs have some surrounding callus formation, subacute or chronic, unchanged from recent chest CT. IMPRESSION: 1. Enteric contrast in the distal esophagus, can be seen with reflux or delayed transit. 2. Colonic diverticulosis without acute inflammation. 3. Aortic atherosclerosis  without aneurysm.  IVC filter in place. Electronically Signed   By: Rubye OaksMelanie  Ehinger M.D.   On:  12/22/2016 02:43   Dg Thoracic Spine 1 View  Result Date: 12/21/2016 CLINICAL DATA:  T6 compression fracture, elective surgery EXAM: OPERATIVE THORACIC SPINE 3 VIEW(S) COMPARISON:  MRI from 12/02/2016 FINDINGS: Fluoroscopic guidance provided during a mid thoracic kyphoplasty procedure presumably at the T6 vertebra based on morphology in comparison with prior MRI from 12/02/16. Minimal extravasation of cement is seen along the anterior inferior endplate. 12.1 mGy utilized with 1 minutes 51 seconds of fluoroscopic time utilized. IMPRESSION: Kyphoplasty of a mid thoracic vertebral body consistent with the T6 vertebral body based on prior MRI. Electronically Signed   By: Tollie Ethavid  Kwon M.D.   On: 12/21/2016 17:44   Dg C-arm 1-60 Min  Result Date: 12/21/2016 CLINICAL DATA:  T6 compression fracture, elective surgery EXAM: OPERATIVE THORACIC SPINE 3 VIEW(S) COMPARISON:  MRI from 12/02/2016 FINDINGS: Fluoroscopic guidance provided during a mid thoracic kyphoplasty procedure presumably at the T6 vertebra based on morphology in comparison with prior MRI from 12/02/16. Minimal extravasation of cement is seen along the anterior inferior endplate. 12.1 mGy utilized with 1 minutes 51 seconds of fluoroscopic time utilized. IMPRESSION: Kyphoplasty of a mid thoracic vertebral body consistent with the T6 vertebral body based on prior MRI. Electronically Signed   By: Tollie Ethavid  Kwon M.D.   On: 12/21/2016 17:44    Anti-infectives: Anti-infectives    Start     Dose/Rate Route Frequency Ordered Stop   12/22/16 0900  abacavir-dolutegravir-lamiVUDine (TRIUMEQ) 600-50-300 MG per tablet 1 tablet    Comments:  Reported on 01/30/2016     1 tablet Oral Daily after breakfast 12/21/16 1814     12/21/16 1337  clindamycin (CLEOCIN) 900 MG/50ML IVPB    Comments:  KENNEDY, ASHLEY: cabinet override      12/21/16 1337 12/21/16 1626   12/21/16 1335  clindamycin (CLEOCIN) 600 MG/50ML IVPB  Status:  Discontinued    Comments:  KENNEDY, ASHLEY:  cabinet override      12/21/16 1335 12/21/16 1332   12/21/16 0245  clindamycin (CLEOCIN) IVPB 900 mg     900 mg 100 mL/hr over 30 Minutes Intravenous  Once 12/21/16 0239 12/21/16 1656      Assessment/Plan:  This patient with left-sided pain both abdomen and back and chest following a kyphoplasty at T6. I see no sign of acute abdominal process is CT scan is been personally reviewed. His labs are normal including his white blood cell count. He is not acidotic. I see no sign of acute process in this patient is tolerating a regular diet and will follow him while in the hospital he does have diverticulosis but no sign of diverticulitis. His care was discussed with Dr. Tobin Chadmen's earlier today.  Lattie Hawichard E Cooper, MD, FACS  12/22/2016

## 2016-12-22 NOTE — Consult Note (Signed)
Consult Note  [facility] - [place] @ [facility] Admission History and Physical [employer], D.O.   Patient Name: Brian Hampton MR#: [id number] Date of Birth: [birth year] Date of Admission: 12/21/2016  Referring MD/NP/PA: Dr. Joice Lofts Primary Care Physician: Tonye Royalty, DO   Chief Complaint: "Chest pain"  HPI: Brian Hampton is a 61 y.o. male with a known history of anemia, asthma, collagen vascular disease, COPD, coronary artery disease status post MI in 2000, depression, DVT, PE, HIV, hypertension, type 2 diabetes was admitted for observation following a T6 kyphoplasty performed by Dr. Rosita Kea today. Medical consultation was requested for "chest pain." On my questioning patient reports left flank and abdominal pain which described as 12/10, localized initially but recently has been radiating into his left arm and is associated with abdominal distention, mild anxiety and mild shortness of breath. He states his pain was only mildly relieved by morphine. He has had bowel movements and is passing gas. Patient denies fevers/chills, weakness, dizziness, N/V/C/D, abdominal pain, dysuria/frequency, changes in mental status.    Please note the patient's story has varied.  Otherwise there has been no change in status. Patient has been taking medication as prescribed and there has been no recent change in medication or diet.  No recent antibiotics.  There has been no recent illness, hospitalizations, travel or sick contacts.    Review of Systems:  CONSTITUTIONAL: No fever/chills, fatigue, weakness, weight gain/loss, headache. EYES: No blurry or double vision. ENT: No tinnitus, postnasal drip, redness or soreness of the oropharynx. RESPIRATORY:  positive mild shortness of breath. No cough, wheeze.  No hemoptysis.  CARDIOVASCULAR: No chest pain, palpitations, syncope, orthopnea. No lower extremity edema.  GASTROINTESTINAL: Positive abdominal pain. No nausea, vomiting, diarrhea, constipation.  No  hematemesis, melena or hematochezia. GENITOURINARY: No dysuria, frequency, hematuria. ENDOCRINE: No polyuria or nocturia. No heat or cold intolerance. HEMATOLOGY: No anemia, bruising, bleeding. INTEGUMENTARY: No rashes, ulcers, lesions. MUSCULOSKELETAL: No arthritis, gout, dyspnea. NEUROLOGIC: No numbness, tingling, ataxia, seizure-type activity, weakness. PSYCHIATRIC: No anxiety, depression, insomnia.   Past Medical History:  Diagnosis Date  . Anemia   . Asthma   . Cataracts, bilateral    worse in Rt eye  . Collagen vascular disease (HCC)   . COPD (chronic obstructive pulmonary disease) (HCC)   . Coronary artery disease   . Depression   . DVT (deep venous thrombosis) (HCC)   . Dysrhythmia   . Emphysema/COPD (HCC)   . Environmental and seasonal allergies   . H/O blood clots   . Heart murmur   . HIV (human immunodeficiency virus infection) (HCC)   . Hypertension   . Leaky heart valve    x 3  . Lung mass   . Myocardial infarction (HCC)    in 2000  . Pneumonia    year ago  . Pulmonary emboli (HCC)   . Type 2 diabetes mellitus (HCC)     Past Surgical History:  Procedure Laterality Date  . ANKLE ARTHROSCOPY    . IVC FILTER INSERTION    . PERIPHERAL VASCULAR CATHETERIZATION N/A 03/16/2016   Procedure: IVC Filter Insertion;  Surgeon: Renford Dills, MD;  Location: [facility] INVASIVE CV LAB;  Service: Cardiovascular;  Laterality: N/A;  . SINUS EXPLORATION    . WRIST ARTHROSCOPY Right      reports that he quit smoking about 15 years ago. He has never used smokeless tobacco. He reports that he does not drink alcohol or use drugs.  Allergies  Allergen Reactions  . Aspirin Anaphylaxis  .  Bee Venom Anaphylaxis  . Penicillins Anaphylaxis and Other (See Comments)    Has patient had a PCN reaction causing immediate rash, facial/tongue/throat swelling, SOB or lightheadedness with hypotension: Yes Has patient had a PCN reaction causing severe rash involving mucus membranes or skin  necrosis: No Has patient had a PCN reaction that required hospitalization No Has patient had a PCN reaction occurring within the last 10 years: Yes If all of the above answers are "NO", then may proceed with Cephalosporin use.  . Prednisone Anaphylaxis  . Sulfa Antibiotics Anaphylaxis  . Sulfasalazine Anaphylaxis  . Theophyllines Anaphylaxis  . Theophylline Swelling    Family History  Problem Relation Age of Onset  . CAD Unknown     Prior to Admission medications   Medication Sig Start Date End Date Taking? Authorizing Provider  abacavir-dolutegravir-lamiVUDine (TRIUMEQ) 600-50-300 MG tablet Take 1 tablet by mouth daily after breakfast. Reported on 01/30/2016 01/27/16  Yes [provider]  albuterol (PROVENTIL HFA;VENTOLIN HFA) 108 (90 BASE) MCG/ACT inhaler Inhale 2 puffs into the lungs every 6 (six) hours as needed for wheezing or shortness of breath.   Yes [provider]  albuterol (PROVENTIL) (2.5 MG/3ML) 0.083% nebulizer solution Take 2.5 mg by nebulization 3 (three) times daily.    Yes [provider]  atorvastatin (LIPITOR) 10 MG tablet Take 10 mg by mouth daily after breakfast. 11/16/16  Yes [provider]  Calcium Carbonate-Vitamin D (CALCIUM 600+D) 600-400 MG-UNIT tablet Take 1 tablet by mouth daily after breakfast.    Yes [provider]  carvedilol (COREG) 12.5 MG tablet Take 12.5 mg by mouth 2 (two) times daily with a meal.   Yes [provider]  cetirizine (ZYRTEC) 10 MG tablet Take 10 mg by mouth daily as needed for allergies.    Yes [provider]  EPINEPHrine (EPIPEN 2-PAK) 0.3 mg/0.3 mL IJ SOAJ injection Inject 0.3 mg into the muscle once as needed (for severe allergic reaction).   Yes [provider]  escitalopram (LEXAPRO) 20 MG tablet Take 20 mg by mouth daily after breakfast.    Yes [provider]  furosemide (LASIX) 20 MG tablet Take 20 mg by mouth daily.  10/22/16  Yes [provider]  HYDROcodone-acetaminophen (NORCO) 5-325 MG tablet Take 1 tablet by mouth every 4 (four) hours as needed for moderate pain. 11/26/16  Yes Willy Eddyobinson, Patrick, MD  LINZESS 72 MCG capsule Take 72 mcg by mouth daily after breakfast. 11/10/16  Yes [provider]  magnesium oxide (MAG-OX) 400 MG tablet Take 400 mg by mouth daily after breakfast.   Yes [provider]  meloxicam (MOBIC) 7.5 MG tablet Take 7.5 mg by mouth daily as needed for pain.    Yes [provider]  mirtazapine (REMERON) 15 MG tablet Take 15 mg by mouth every evening.    Yes [provider]  mometasone-formoterol (DULERA) 200-5 MCG/ACT AERO Inhale 2 puffs into the lungs 2 (two) times daily as needed for wheezing or shortness of breath.    Yes [provider]  Multiple Vitamin (MULTIVITAMIN WITH MINERALS) TABS tablet Take 1 tablet by mouth daily. ONE-A-DAY MULTIVITAMIN 50+   Yes [provider]  nitroGLYCERIN (NITROSTAT) 0.4 MG SL tablet Place 0.4 mg under the tongue every 5 (five) minutes as needed for chest pain.   Yes [provider]  omeprazole (PRILOSEC) 40 MG capsule Take 40 mg by mouth daily after breakfast. 11/16/16  Yes [provider]  Potassium Chloride CR (MICRO-K) 8 MEQ  CPCR capsule CR Take 1 capsule by mouth daily after breakfast. 11/15/16  Yes [provider]  tiotropium (SPIRIVA) 18 MCG inhalation capsule Place 18 mcg into inhaler and inhale daily after breakfast.    Yes [provider]  traMADol (ULTRAM) 50 MG tablet Take 1 tablet (50 mg total) by mouth every 6 (six) hours as needed. Patient taking differently: Take 50 mg by mouth 2 (two) times daily.  03/17/16  Yes Katharina Caper, MD  warfarin (COUMADIN) 4 MG tablet Take 4 mg by mouth daily. Take 1/2 tablet (2 MG) on Friday, Saturday, & Take 1 tablet (4 MG) on Sunday,  Monday, Tuesday, Wednesday, & Thursday   Yes [provider]  zolpidem (AMBIEN) 5 MG tablet Take 5  mg by mouth at bedtime. 10/15/16  Yes [provider]  enoxaparin (LOVENOX) 150 MG/ML injection Inject 0.44 mLs (65 mg total) into the skin every 12 (twelve) hours. 03/17/16   Katharina Caper, MD  levalbuterol (XOPENEX HFA) 45 MCG/ACT inhaler Inhale 1 puff into the lungs 3 (three) times daily. Patient not taking: Reported on 12/15/2016 05/12/16   Katha Hamming, MD  senna-docusate (SENOKOT-S) 8.6-50 MG tablet Take 1 tablet by mouth at bedtime as needed for mild constipation. Patient not taking: Reported on 11/26/2016 01/31/16   Katharina Caper, MD    Physical Exam: Vitals:   12/21/16 2021 12/21/16 2121 12/21/16 2221 12/21/16 2357  BP: 125/84 131/80 134/83 138/72  Pulse: 77 90 93 81  Resp: 19 20 20 20   Temp: 98.4 F (36.9 C) 98.5 F (36.9 C) 98.7 F (37.1 C) 97 F (36.1 C)  TempSrc: Oral Oral Oral Oral  SpO2: 97% 100% 100% 95%  Weight:      Height:        GENERAL: 60 y.o.-year-White malepatient, well-developed, well-nourished lying in the bed in no acute distress.  Pleasant and cooperative.   HEENT: Head atraumatic, normocephalic. Pupils equal, round, reactive to light and accommodation. No scleral icterus.  Mucus membranes moist. NECK: Supple, full range of motion. No JVD, no bruit heard. No thyroid enlargement, no tenderness, no cervical lymphadenopathy. CHEST: Normal breath sounds bilaterally. No wheezing, rales, rhonchi or crackles. No use of accessory muscles of respiration.  No reproducible chest wall tenderness.  CARDIOVASCULAR: S1, S2 normal. No murmurs, rubs, or gallops. Cap refill <2 seconds. Pulses intact distally.  ABDOMEN: Soft,Distended. Positive tenderness to palpation over the left upper quadrant and left lower quadrant. No rebound, guarding, rigidity. EXTREMITIES: No pedal edema, cyanosis, or clubbing. No calf tenderness or Homan's sign.  NEUROLOGIC: The patient is alert and oriented x 3. Cranial nerves II through XII are grossly intact with no focal sensorimotor  deficit. Muscle strength 5/5 in all extremities. Sensation intact. Gait not checked. PSYCHIATRIC:  Normal affect, mood, thought content. SKIN: Warm, dry, and intact without obvious rash, lesion, or ulcer.    Labs on Admission:  CBC:  Recent Labs Lab 12/21/16 2251  WBC 9.4  HGB 13.5  HCT 39.5*  MCV 95.8  PLT 218   Basic Metabolic Panel:  Recent Labs Lab 12/21/16 2251  NA 137  K 3.3*  CL 103  CO2 27  GLUCOSE 144*  BUN 22*  CREATININE 1.30*  CALCIUM 8.7*   GFR: Estimated Creatinine Clearance: 61 mL/min (A) (by C-G formula based on SCr of 1.3 mg/dL (H)). Liver Function Tests:  Recent Labs Lab 12/21/16 2251  AST 24  ALT 21  ALKPHOS 198*  BILITOT 0.6  PROT 7.8  ALBUMIN 3.7  No results for input(s): LIPASE, AMYLASE in the last 168 hours. No results for input(s): AMMONIA in the last 168 hours. Coagulation Profile:  Recent Labs Lab 12/21/16 1400  INR 1.02   Cardiac Enzymes:  Recent Labs Lab 12/21/16 2236  TROPONINI <0.03   BNP (last 3 results) No results for input(s): PROBNP in the last 8760 hours. HbA1C: No results for input(s): HGBA1C in the last 72 hours. CBG:  Recent Labs Lab 12/21/16 1314 12/21/16 1716 12/21/16 2059  GLUCAP 101* 106* 149*   Lipid Profile: No results for input(s): CHOL, HDL, LDLCALC, TRIG, CHOLHDL, LDLDIRECT in the last 72 hours. Thyroid Function Tests:  Recent Labs  12/21/16 2236  TSH 11.054*   Anemia Panel: No results for input(s): VITAMINB12, FOLATE, FERRITIN, TIBC, IRON, RETICCTPCT in the last 72 hours. Urine analysis:    Component Value Date/Time   COLORURINE YELLOW (A) 05/05/2016 1631   APPEARANCEUR CLEAR (A) 05/05/2016 1631   LABSPEC 1.012 05/05/2016 1631   PHURINE 5.0 05/05/2016 1631   GLUCOSEU NEGATIVE 05/05/2016 1631   HGBUR 2+ (A) 05/05/2016 1631   BILIRUBINUR NEGATIVE 05/05/2016 1631   KETONESUR NEGATIVE 05/05/2016 1631   PROTEINUR 30 (A) 05/05/2016 1631   NITRITE NEGATIVE 05/05/2016 1631    LEUKOCYTESUR NEGATIVE 05/05/2016 1631   Sepsis Labs: @LABRCNTIP (procalcitonin:4,lacticidven:4) ) Recent Results (from the past 240 hour(s))  Surgical pcr screen     Status: None   Collection Time: 12/16/16  1:17 PM  Result Value Ref Range Status   MRSA, PCR NEGATIVE NEGATIVE Final   Staphylococcus aureus NEGATIVE NEGATIVE Final    Comment:        The Xpert SA Assay (FDA approved for NASAL specimens in patients over 69 years of age), is one component of a comprehensive surveillance program.  Test performance has been validated by [facility] for patients greater than or equal to 108 year old. It is not intended to diagnose infection nor to guide or monitor treatment.      Radiological Exams on Admission: Dg Thoracic Spine 1 View  Result Date: 12/21/2016 CLINICAL DATA:  T6 compression fracture, elective surgery EXAM: OPERATIVE THORACIC SPINE 3 VIEW(S) COMPARISON:  MRI from 12/02/2016 FINDINGS: Fluoroscopic guidance provided during a mid thoracic kyphoplasty procedure presumably at the T6 vertebra based on morphology in comparison with prior MRI from 12/02/16. Minimal extravasation of cement is seen along the anterior inferior endplate. 12.1 mGy utilized with 1 minutes 51 seconds of fluoroscopic time utilized. IMPRESSION: Kyphoplasty of a mid thoracic vertebral body consistent with the T6 vertebral body based on prior MRI. Electronically Signed   By: Tollie Eth M.D.   On: 12/21/2016 17:44   Dg C-arm 1-60 Min  Result Date: 12/21/2016 CLINICAL DATA:  T6 compression fracture, elective surgery EXAM: OPERATIVE THORACIC SPINE 3 VIEW(S) COMPARISON:  MRI from 12/02/2016 FINDINGS: Fluoroscopic guidance provided during a mid thoracic kyphoplasty procedure presumably at the T6 vertebra based on morphology in comparison with prior MRI from 12/02/16. Minimal extravasation of cement is seen along the anterior inferior endplate. 12.1 mGy utilized with 1 minutes 51 seconds of fluoroscopic time  utilized. IMPRESSION: Kyphoplasty of a mid thoracic vertebral body consistent with the T6 vertebral body based on prior MRI. Electronically Signed   By: Tollie Eth M.D.   On: 12/21/2016 17:44    EKG: Normal sinus rhythm with normal axis and nonspecific ST-T wave changes.   Assessment/Plan  This is a 61 y.o. male with a history of anemia, asthma, collagen vascular disease, COPD, coronary artery disease  status post MI in 2000, depression, DVT, PE, HIV, hypertension, type 2 diabetes  now with:  #. Atypical chest pain / abdominal pain - Check stat labs - CBC, CMP, trops - Chest pain, rule out ACS - Trend troponins, check lipids and TSH. - Morphine, nitro, beta blocker, statin ordered.   - Check CT abdomen / pelvis with PO contrast only - Surgical consultation for abdominal pain given exam and severity - D/W Dr. Earlene Plater  #. History of coronary artery disease -Continue Lipitor, Coreg,  #. History of DVT PE -Continue Eliquis  #. History of HIV - Continue HAART  #. History of Depression- Continue Lexapro #. History of COPD- Continue Spiriva #. History of DM - RISS  All the records are reviewed and case discussed with ED provider. Management plans discussed with the patient and/or family who express understanding and agree with plan of care.  Shirl Weir D.O. on 12/22/2016 at 2:35 AM Between 7am to 6pm - Pager - [phone] After 6pm go to [email or web address] - [facility] Office [phone] CC: Primary care physician; Tonye Royalty, DO   12/22/2016, 2:35 AM

## 2016-12-22 NOTE — Progress Notes (Signed)
Dr. Tobi BastosPyreddy clarified order for CT of abdomen without contrast. New order entered.

## 2016-12-22 NOTE — Progress Notes (Signed)
   Subjective: 1 Day Post-Op Procedure(s) (LRB): KYPHOPLASTY (N/A) Patient states the back pain he was having prior to the kyphoplasty has resolved. He is now complaining of chest tightness and wheezing. Recently the had a breathing treatment with no improvement. Medicine is following.   Objective: Vital signs in last 24 hours: Temp:  [97 F (36.1 C)-98.9 F (37.2 C)] 98.5 F (36.9 C) (05/16 0815) Pulse Rate:  [77-94] 84 (05/16 0815) Resp:  [15-21] 16 (05/16 0815) BP: (125-164)/(72-108) 138/85 (05/16 0815) SpO2:  [93 %-100 %] 93 % (05/16 0815) Weight:  [83.9 kg (185 lb)] 83.9 kg (185 lb) (05/15 1334)  Intake/Output from previous day: 05/15 0701 - 05/16 0700 In: 903.3 [I.V.:903.3] Out: 350 [Urine:350] Intake/Output this shift: No intake/output data recorded.   Recent Labs  12/21/16 2251  HGB 13.5    Recent Labs  12/21/16 2251  WBC 9.4  RBC 4.12*  HCT 39.5*  PLT 218    Recent Labs  12/21/16 2251  NA 137  K 3.3*  CL 103  CO2 27  BUN 22*  CREATININE 1.30*  GLUCOSE 144*  CALCIUM 8.7*    Recent Labs  12/21/16 1400 12/22/16 0504  INR 1.02 0.97    EXAM General - Patient is Alert, Appropriate and Oriented Extremity - Neurovascular intact Sensation intact distally Intact pulses distally No cellulitis present Compartment soft  Thoracic spine nontender to palpation. Dressing clean dry and intact. No paravertebral muscle tenderness. Lungs: Expiratory wheezing bilaterally Dressing - dressing C/D/I and no drainage Motor Function - intact, moving foot and toes well on exam.  Past Medical History:  Diagnosis Date  . Anemia   . Asthma   . Cataracts, bilateral    worse in Rt eye  . Collagen vascular disease (HCC)   . COPD (chronic obstructive pulmonary disease) (HCC)   . Coronary artery disease   . Depression   . DVT (deep venous thrombosis) (HCC)   . Dysrhythmia   . Emphysema/COPD (HCC)   . Environmental and seasonal allergies   . H/O blood clots    . Heart murmur   . HIV (human immunodeficiency virus infection) (HCC)   . Hypertension   . Leaky heart valve    x 3  . Lung mass   . Myocardial infarction (HCC)    in 2000  . Pneumonia    year ago  . Pulmonary emboli (HCC)   . Type 2 diabetes mellitus (HCC)     Assessment/Plan:   1 Day Post-Op Procedure(s) (LRB): KYPHOPLASTY (N/A) Active Problems:   Compression fracture of body of thoracic vertebra (HCC)  Estimated body mass index is 30.79 kg/m as calculated from the following:   Height as of this encounter: 5\' 5"  (1.651 m).   Weight as of this encounter: 83.9 kg (185 lb). Advance diet  Thoracic back pain resolved, patient complaining of chest tightness, wheezing. Currently undergoing breathing treatments. Medicine following. Recommend discharge home after medical clearance.    Lollie Marrow. Chris Maylin [place], PA-C [facility] 12/22/2016, 8:20 AM

## 2016-12-22 NOTE — Progress Notes (Signed)
Patient ID: Brian Hampton, male   DOB: 26-Feb-1956, 61 y.o.   MRN: [id number]  [employer] PROGRESS NOTE  Brian Hampton NWG:[id number] DOB: 08/05/1956 DOA: 12/21/2016 PCP: Tonye Royalty, DO  HPI/Subjective: Patient complaining of wheezing and shortness of breath and chest pain and abdominal pain all going on for a few weeks now. He just had a kyphoplasty yesterday. His back pain seems to be better. Patient states that he's been having diarrhea.  Objective: Vitals:   12/22/16 0725 12/22/16 0815  BP: (!) 138/91 138/85  Pulse: 82 84  Resp: 17 16  Temp: 98.2 F (36.8 C) 98.5 F (36.9 C)    Filed Weights   12/21/16 1334  Weight: 83.9 kg (185 lb)    ROS: Review of Systems  Constitutional: Negative for chills and fever.  Eyes: Negative for blurred vision.  Respiratory: Positive for cough, shortness of breath and wheezing.   Cardiovascular: Positive for chest pain.  Gastrointestinal: Positive for abdominal pain and diarrhea. Negative for constipation, nausea and vomiting.  Genitourinary: Negative for dysuria.  Musculoskeletal: Negative for joint pain.  Neurological: Negative for dizziness and headaches.   Exam: Physical Exam  Constitutional: He is oriented to person, place, and time.  HENT:  Nose: No mucosal edema.  Mouth/Throat: No oropharyngeal exudate or posterior oropharyngeal edema.  Eyes: Conjunctivae, EOM and lids are normal. Pupils are equal, round, and reactive to light.  Neck: No JVD present. Carotid bruit is not present. No edema present. No thyroid mass and no thyromegaly present.  Cardiovascular: S1 normal and S2 normal.  Exam reveals no gallop.   No murmur heard. Pulses:      Dorsalis pedis pulses are 2+ on the right side, and 2+ on the left side.  Pain to palpation over the chest wall.  Respiratory: No respiratory distress. He has decreased breath sounds in the right middle field, the right lower field, the left middle field and the left lower field. He has  wheezes in the right middle field, the right lower field, the left middle field and the left lower field. He has no rhonchi. He has no rales.  GI: Soft. Bowel sounds are normal. He exhibits distension. There is tenderness.  Musculoskeletal:       Right ankle: He exhibits swelling.       Left ankle: He exhibits swelling.  Lymphadenopathy:    He has no cervical adenopathy.  Neurological: He is alert and oriented to person, place, and time. No cranial nerve deficit.  Skin: Skin is warm. No rash noted. Nails show no clubbing.  Psychiatric: He has a normal mood and affect.      Data Reviewed: Basic Metabolic Panel:  Recent Labs Lab 12/21/16 2251  NA 137  K 3.3*  CL 103  CO2 27  GLUCOSE 144*  BUN 22*  CREATININE 1.30*  CALCIUM 8.7*   Liver Function Tests:  Recent Labs Lab 12/21/16 2251  AST 24  ALT 21  ALKPHOS 198*  BILITOT 0.6  PROT 7.8  ALBUMIN 3.7   CBC:  Recent Labs Lab 12/21/16 2251  WBC 9.4  HGB 13.5  HCT 39.5*  MCV 95.8  PLT 218   Cardiac Enzymes:  Recent Labs Lab 12/21/16 2236  TROPONINI <0.03   BNP (last 3 results)  Recent Labs  03/15/16 1549  BNP 109.0*   CBG:  Recent Labs Lab 12/21/16 1314 12/21/16 1716 12/21/16 2059 12/22/16 0724 12/22/16 1142  GLUCAP 101* 106* 149* 95 122*    Recent Results (from  the past 240 hour(s))  Surgical pcr screen     Status: None   Collection Time: 12/16/16  1:17 PM  Result Value Ref Range Status   MRSA, PCR NEGATIVE NEGATIVE Final   Staphylococcus aureus NEGATIVE NEGATIVE Final    Comment:        The Xpert SA Assay (FDA approved for NASAL specimens in patients over 60 years of age), is one component of a comprehensive surveillance program.  Test performance has been validated by [facility] for patients greater than or equal to 121 year old. It is not intended to diagnose infection nor to guide or monitor treatment.      Studies: Ct Abdomen Pelvis Wo Contrast  Result Date:  12/22/2016 CLINICAL DATA:  Abdominal pain.  Kyphoplasty earlier this day. EXAM: CT ABDOMEN AND PELVIS WITHOUT CONTRAST TECHNIQUE: Multidetector CT imaging of the abdomen and pelvis was performed following the standard protocol without IV contrast. COMPARISON:  Abdominal CT 01/31/2016 FINDINGS: Lower chest: Linear atelectasis or scarring at the lung bases. Enteric contrast in the distal esophagus. Hepatobiliary: No focal liver abnormality is seen. No gallstones, gallbladder wall thickening, or biliary dilatation. Pancreas: No ductal dilatation or inflammation. Spleen: Normal in size without focal abnormality. Adrenals/Urinary Tract: Normal adrenal glands. No hydronephrosis or perinephric edema. There is renal parenchymal thinning. No urolithiasis. Urinary bladder is physiologically distended without bladder wall thickening. Stomach/Bowel: Stomach distended with enteric contrast. No gastric wall thickening. No small bowel obstruction or inflammation, enteric contrast reaches the colon. Normal appendix. Small volume of colonic stool without colonic wall thickening. Diverticulosis from the splenic flexure distally. No acute diverticulitis. Vascular/Lymphatic: Mild aortic atherosclerosis without aneurysm. Infrarenal IVC filter in place. No intra-abdominal or pelvic adenopathy. Reproductive: Prostate is unremarkable. Other: No free air, free fluid, or intra-abdominal fluid collection. Fat within both inguinal canals. Tiny fat containing umbilical hernia. Musculoskeletal: T8 compression fracture is chronic as seen on thoracic spine MRI 12/02/2016. The treated T6 kyphoplasty is not included in the field of view. Multilevel degenerative disc disease and facet arthropathy throughout the lumbar spine. Fractures of right anterior ribs have some surrounding callus formation, subacute or chronic, unchanged from recent chest CT. IMPRESSION: 1. Enteric contrast in the distal esophagus, can be seen with reflux or delayed transit. 2.  Colonic diverticulosis without acute inflammation. 3. Aortic atherosclerosis without aneurysm.  IVC filter in place. Electronically Signed   By: Rubye OaksMelanie  Ehinger M.D.   On: 12/22/2016 02:43   Dg Thoracic Spine 1 View  Result Date: 12/21/2016 CLINICAL DATA:  T6 compression fracture, elective surgery EXAM: OPERATIVE THORACIC SPINE 3 VIEW(S) COMPARISON:  MRI from 12/02/2016 FINDINGS: Fluoroscopic guidance provided during a mid thoracic kyphoplasty procedure presumably at the T6 vertebra based on morphology in comparison with prior MRI from 12/02/16. Minimal extravasation of cement is seen along the anterior inferior endplate. 12.1 mGy utilized with 1 minutes 51 seconds of fluoroscopic time utilized. IMPRESSION: Kyphoplasty of a mid thoracic vertebral body consistent with the T6 vertebral body based on prior MRI. Electronically Signed   By: Tollie Ethavid  Kwon M.D.   On: 12/21/2016 17:44   Dg C-arm 1-60 Min  Result Date: 12/21/2016 CLINICAL DATA:  T6 compression fracture, elective surgery EXAM: OPERATIVE THORACIC SPINE 3 VIEW(S) COMPARISON:  MRI from 12/02/2016 FINDINGS: Fluoroscopic guidance provided during a mid thoracic kyphoplasty procedure presumably at the T6 vertebra based on morphology in comparison with prior MRI from 12/02/16. Minimal extravasation of cement is seen along the anterior inferior endplate. 12.1 mGy utilized with 1 minutes 51  seconds of fluoroscopic time utilized. IMPRESSION: Kyphoplasty of a mid thoracic vertebral body consistent with the T6 vertebral body based on prior MRI. Electronically Signed   By: Tollie Eth M.D.   On: 12/21/2016 17:44    Scheduled Meds: . abacavir-dolutegravir-lamiVUDine  1 tablet Oral QPC breakfast  . atorvastatin  10 mg Oral QPC breakfast  . calcium-vitamin D  1 tablet Oral QPC breakfast  . carvedilol  12.5 mg Oral BID WC  . docusate sodium  100 mg Oral BID  . escitalopram  20 mg Oral QPC breakfast  . furosemide  20 mg Oral Daily  . ipratropium-albuterol  3  mL Nebulization Q6H  . linaclotide  72 mcg Oral QPC breakfast  . loratadine  10 mg Oral Daily  . magnesium oxide  400 mg Oral QPC breakfast  . methylPREDNISolone (SOLU-MEDROL) injection  40 mg Intravenous Daily  . mirtazapine  15 mg Oral QPM  . mometasone-formoterol  2 puff Inhalation BID  . multivitamin with minerals  1 tablet Oral Q supper  . pantoprazole  80 mg Oral QAC breakfast  . potassium chloride  10 mEq Oral Daily  . tiotropium  18 mcg Inhalation QPC breakfast  . [START ON 12/24/2016] warfarin  2 mg Oral Once per day on Fri Sat   And  . warfarin  4 mg Oral Once per day on Sun Mon Tue Wed Thu  . Warfarin - Physician Dosing Inpatient   Does not apply q1800  . zolpidem  5 mg Oral QHS   Continuous Infusions: . methocarbamol (ROBAXIN)  IV      Assessment/Plan:  1. COPD exacerbation. Start IV Solu-Medrol 40 mg IV daily. Standing dose budesonide nebulizers. Continue dulera inhaler. Also give 1 dose of Lasix. 2. Abdominal pain. CT scan negative. Patient with diarrhea. Send off stool studies. 3. History of DVT and pulmonary emboli. Restart Coumadin. Also has a history of an IVC filter. 4. Status post kyphoplasty continue pain medications. 5. HIV continue usual haart medication 6. History of collagen vascular disease, chest wall pain. Steroid may help. 7. Hyperlipidemia unspecified on atorvastatin 8. GERD on Protonix  Code Status:     Code Status Orders        Start     Ordered   12/21/16 1815  Do not attempt resuscitation (DNR)  Continuous    Question Answer Comment  In the event of cardiac or respiratory ARREST Do not call a "code blue"   In the event of cardiac or respiratory ARREST Do not perform Intubation, CPR, defibrillation or ACLS   In the event of cardiac or respiratory ARREST Use medication by any route, position, wound care, and other measures to relive pain and suffering. May use oxygen, suction and manual treatment of airway obstruction as needed for comfort.       12/21/16 1814    Code Status History    Date Active Date Inactive Code Status Order ID Comments User Context   05/05/2016 10:17 PM 05/12/2016  6:20 PM DNR [id number]  Altamese Dilling, MD Inpatient   03/15/2016  5:42 PM 03/17/2016  6:59 PM DNR [id number]  Auburn Bilberry, MD ED   03/15/2016  5:29 PM 03/15/2016  5:42 PM Full Code [id number]  Auburn Bilberry, MD ED   01/30/2016 12:55 AM 02/03/2016 12:55 AM DNR [id number]  Gery Pray, MD Inpatient   01/13/2016  9:46 PM 01/16/2016  4:18 PM Full Code [id number]  Shaune Pollack, MD Inpatient   06/24/2015  7:13 AM 06/25/2015 12:27 PM Full Code  [id number]  Milagros Loll, MD ED    Advance Directive Documentation     Most Recent Value  Type of Advance Directive  Living will  Pre-existing out of facility DNR order (yellow form or pink MOST form)  -  "MOST" Form in Place?  -     Disposition Plan: Cancel discharge today. Case discussed with Dr. Rosita Kea surgery  Consultants:  Orthopedic surgery  General surgery  Procedures:  Kyphoplasty  Time spent: 28 minutes  Alford Highland  [employer]

## 2016-12-22 NOTE — Progress Notes (Signed)
Patient alert and oriented. Wheezing on auscultation. MD aware and ordering medications. Patient complaining of abdominal pain, RN administered pain medications. Surgical site clean try and intact. RN helped patient up to recliner.   Harvie HeckMelanie Aydan Levitz, RN

## 2016-12-23 DIAGNOSIS — Z7901 Long term (current) use of anticoagulants: Secondary | ICD-10-CM | POA: Diagnosis not present

## 2016-12-23 DIAGNOSIS — R1084 Generalized abdominal pain: Secondary | ICD-10-CM | POA: Diagnosis not present

## 2016-12-23 DIAGNOSIS — G8918 Other acute postprocedural pain: Secondary | ICD-10-CM | POA: Diagnosis not present

## 2016-12-23 DIAGNOSIS — I2782 Chronic pulmonary embolism: Secondary | ICD-10-CM | POA: Diagnosis not present

## 2016-12-23 DIAGNOSIS — I10 Essential (primary) hypertension: Secondary | ICD-10-CM | POA: Diagnosis present

## 2016-12-23 DIAGNOSIS — E785 Hyperlipidemia, unspecified: Secondary | ICD-10-CM | POA: Diagnosis present

## 2016-12-23 DIAGNOSIS — Z888 Allergy status to other drugs, medicaments and biological substances status: Secondary | ICD-10-CM | POA: Diagnosis not present

## 2016-12-23 DIAGNOSIS — M4854XA Collapsed vertebra, not elsewhere classified, thoracic region, initial encounter for fracture: Secondary | ICD-10-CM | POA: Diagnosis present

## 2016-12-23 DIAGNOSIS — E119 Type 2 diabetes mellitus without complications: Secondary | ICD-10-CM | POA: Diagnosis present

## 2016-12-23 DIAGNOSIS — R1012 Left upper quadrant pain: Secondary | ICD-10-CM | POA: Diagnosis not present

## 2016-12-23 DIAGNOSIS — F329 Major depressive disorder, single episode, unspecified: Secondary | ICD-10-CM | POA: Diagnosis present

## 2016-12-23 DIAGNOSIS — B20 Human immunodeficiency virus [HIV] disease: Secondary | ICD-10-CM | POA: Diagnosis present

## 2016-12-23 DIAGNOSIS — Z882 Allergy status to sulfonamides status: Secondary | ICD-10-CM | POA: Diagnosis not present

## 2016-12-23 DIAGNOSIS — K219 Gastro-esophageal reflux disease without esophagitis: Secondary | ICD-10-CM | POA: Diagnosis present

## 2016-12-23 DIAGNOSIS — Z66 Do not resuscitate: Secondary | ICD-10-CM | POA: Diagnosis present

## 2016-12-23 DIAGNOSIS — Z880 Allergy status to penicillin: Secondary | ICD-10-CM | POA: Diagnosis not present

## 2016-12-23 DIAGNOSIS — I2510 Atherosclerotic heart disease of native coronary artery without angina pectoris: Secondary | ICD-10-CM | POA: Diagnosis present

## 2016-12-23 DIAGNOSIS — S22050A Wedge compression fracture of T5-T6 vertebra, initial encounter for closed fracture: Secondary | ICD-10-CM | POA: Diagnosis not present

## 2016-12-23 DIAGNOSIS — R109 Unspecified abdominal pain: Secondary | ICD-10-CM | POA: Diagnosis not present

## 2016-12-23 DIAGNOSIS — J441 Chronic obstructive pulmonary disease with (acute) exacerbation: Secondary | ICD-10-CM | POA: Diagnosis present

## 2016-12-23 DIAGNOSIS — Z86718 Personal history of other venous thrombosis and embolism: Secondary | ICD-10-CM | POA: Diagnosis not present

## 2016-12-23 DIAGNOSIS — R079 Chest pain, unspecified: Secondary | ICD-10-CM | POA: Diagnosis not present

## 2016-12-23 DIAGNOSIS — Z886 Allergy status to analgesic agent status: Secondary | ICD-10-CM | POA: Diagnosis not present

## 2016-12-23 DIAGNOSIS — Z86711 Personal history of pulmonary embolism: Secondary | ICD-10-CM | POA: Diagnosis not present

## 2016-12-23 DIAGNOSIS — I252 Old myocardial infarction: Secondary | ICD-10-CM | POA: Diagnosis not present

## 2016-12-23 LAB — GASTROINTESTINAL PANEL BY PCR, STOOL (REPLACES STOOL CULTURE)
ADENOVIRUS F40/41: NOT DETECTED
ASTROVIRUS: NOT DETECTED
CYCLOSPORA CAYETANENSIS: NOT DETECTED
Campylobacter species: NOT DETECTED
Cryptosporidium: NOT DETECTED
ENTAMOEBA HISTOLYTICA: NOT DETECTED
ENTEROAGGREGATIVE E COLI (EAEC): NOT DETECTED
ENTEROTOXIGENIC E COLI (ETEC): NOT DETECTED
Enteropathogenic E coli (EPEC): NOT DETECTED
GIARDIA LAMBLIA: NOT DETECTED
Norovirus GI/GII: NOT DETECTED
Plesimonas shigelloides: NOT DETECTED
Rotavirus A: NOT DETECTED
SAPOVIRUS (I, II, IV, AND V): NOT DETECTED
SHIGA LIKE TOXIN PRODUCING E COLI (STEC): NOT DETECTED
Salmonella species: NOT DETECTED
Shigella/Enteroinvasive E coli (EIEC): NOT DETECTED
VIBRIO CHOLERAE: NOT DETECTED
VIBRIO SPECIES: NOT DETECTED
Yersinia enterocolitica: NOT DETECTED

## 2016-12-23 LAB — SURGICAL PATHOLOGY

## 2016-12-23 LAB — PROTIME-INR
INR: 1.13
PROTHROMBIN TIME: 14.6 s (ref 11.4–15.2)

## 2016-12-23 LAB — C DIFFICILE QUICK SCREEN W PCR REFLEX
C Diff antigen: NEGATIVE
C Diff interpretation: NOT DETECTED
C Diff toxin: NEGATIVE

## 2016-12-23 NOTE — Progress Notes (Signed)
   Subjective: 2 Days Post-Op Procedure(s) (LRB): KYPHOPLASTY (N/A) Patient states the back pain he was having prior to the kyphoplasty has resolved. He continues to have wheezing but this is improving.   Objective: Vital signs in last 24 hours: Temp:  [98.1 F (36.7 C)-98.7 F (37.1 C)] 98.7 F (37.1 C) (05/17 1503) Pulse Rate:  [78-87] 87 (05/17 1503) Resp:  [18] 18 (05/17 0945) BP: (128-143)/(77-90) 143/86 (05/17 1503) SpO2:  [91 %-97 %] 91 % (05/17 1503)  Intake/Output from previous day: 05/16 0701 - 05/17 0700 In: 524.2 [P.O.:240; I.V.:284.2] Out: 1250 [Urine:1250] Intake/Output this shift: Total I/O In: -  Out: 400 [Urine:400]   Recent Labs  12/21/16 2251  HGB 13.5    Recent Labs  12/21/16 2251  WBC 9.4  RBC 4.12*  HCT 39.5*  PLT 218    Recent Labs  12/21/16 2251  NA 137  K 3.3*  CL 103  CO2 27  BUN 22*  CREATININE 1.30*  GLUCOSE 144*  CALCIUM 8.7*    Recent Labs  12/22/16 0504 12/23/16 0453  INR 0.97 1.13    EXAM General - Patient is Alert, Appropriate and Oriented Extremity - Neurovascular intact Sensation intact distally Intact pulses distally No cellulitis present Compartment soft  Thoracic spine nontender to palpation. Dressing clean dry and intact. No paravertebral muscle tenderness. Lungs: Mild espiratory wheezing bilaterally Dressing - dressing C/D/I and no drainage Motor Function - intact, moving foot and toes well on exam.  Past Medical History:  Diagnosis Date  . Anemia   . Asthma   . Cataracts, bilateral    worse in Rt eye  . Collagen vascular disease (HCC)   . COPD (chronic obstructive pulmonary disease) (HCC)   . Coronary artery disease   . Depression   . DVT (deep venous thrombosis) (HCC)   . Dysrhythmia   . Emphysema/COPD (HCC)   . Environmental and seasonal allergies   . H/O blood clots   . Heart murmur   . HIV (human immunodeficiency virus infection) (HCC)   . Hypertension   . Leaky heart valve    x 3   . Lung mass   . Myocardial infarction (HCC)    in 2000  . Pneumonia    year ago  . Pulmonary emboli (HCC)   . Type 2 diabetes mellitus (HCC)     Assessment/Plan:   2 Days Post-Op Procedure(s) (LRB): KYPHOPLASTY (N/A) Active Problems:   Compression fracture of body of thoracic vertebra (HCC)   Abdominal pain   COPD exacerbation (HCC)  Estimated body mass index is 30.79 kg/m as calculated from the following:   Height as of this encounter: 5\' 5"  (1.651 m).   Weight as of this encounter: 83.9 kg (185 lb). Advance diet  Thoracic back pain resolved, progress activity as tolerated Medicine recommends continuing with breathing treatments  Plan on discharge tomorrow   T. Cranston Neighborhris Gaines, PA-C [facility] 12/23/2016, 3:56 PM

## 2016-12-23 NOTE — Progress Notes (Signed)
Patient ID: Brian Hampton, male   DOB: Mar 31, 1956, 61 y.o.   MRN: [id number]  [facility] PROGRESS NOTE  Brian Hampton:[id number] DOB: April 08, 1956 DOA: 12/21/2016 PCP: Tonye Royalty, DO  HPI/Subjective: Patient states that he is feeling better with regards to his chest pain but he still wheezing quite a bit and coughing and short of breath. Still having some abdominal pain and distention.  Objective: Vitals:   12/23/16 0945 12/23/16 1503  BP: 134/90 (!) 143/86  Pulse: 78 87  Resp: 18   Temp: 98.4 F (36.9 C) 98.7 F (37.1 C)    Filed Weights   12/21/16 1334  Weight: 83.9 kg (185 lb)    ROS: Review of Systems  Constitutional: Negative for chills and fever.  Eyes: Negative for blurred vision.  Respiratory: Positive for cough, shortness of breath and wheezing.   Cardiovascular: Negative for chest pain.  Gastrointestinal: Positive for abdominal pain. Negative for constipation, diarrhea, nausea and vomiting.  Genitourinary: Negative for dysuria.  Musculoskeletal: Negative for joint pain.  Neurological: Negative for dizziness and headaches.   Exam: Physical Exam  Constitutional: He is oriented to person, place, and time.  HENT:  Nose: No mucosal edema.  Mouth/Throat: No oropharyngeal exudate or posterior oropharyngeal edema.  Eyes: Conjunctivae, EOM and lids are normal. Pupils are equal, round, and reactive to light.  Neck: No JVD present. Carotid bruit is not present. No edema present. No thyroid mass and no thyromegaly present.  Cardiovascular: S1 normal and S2 normal.  Exam reveals no gallop.   No murmur heard. Pulses:      Dorsalis pedis pulses are 2+ on the right side, and 2+ on the left side.  Pain to palpation over the chest wall.  Respiratory: No respiratory distress. He has decreased breath sounds in the right middle field, the right lower field, the left middle field and the left lower field. He has wheezes in the right middle field, the right lower field,  the left middle field and the left lower field. He has no rhonchi. He has no rales.  GI: Soft. Bowel sounds are normal. He exhibits distension. There is tenderness in the left upper quadrant and left lower quadrant.  Musculoskeletal:       Right ankle: He exhibits swelling.       Left ankle: He exhibits swelling.  Lymphadenopathy:    He has no cervical adenopathy.  Neurological: He is alert and oriented to person, place, and time. No cranial nerve deficit.  Skin: Skin is warm. No rash noted. Nails show no clubbing.  Psychiatric: He has a normal mood and affect.      Data Reviewed: Basic Metabolic Panel:  Recent Labs Lab 12/21/16 2251  NA 137  K 3.3*  CL 103  CO2 27  GLUCOSE 144*  BUN 22*  CREATININE 1.30*  CALCIUM 8.7*   Liver Function Tests:  Recent Labs Lab 12/21/16 2251  AST 24  ALT 21  ALKPHOS 198*  BILITOT 0.6  PROT 7.8  ALBUMIN 3.7   CBC:  Recent Labs Lab 12/21/16 2251  WBC 9.4  HGB 13.5  HCT 39.5*  MCV 95.8  PLT 218   Cardiac Enzymes:  Recent Labs Lab 12/21/16 2236  TROPONINI <0.03   BNP (last 3 results)  Recent Labs  03/15/16 1549  BNP 109.0*   CBG:  Recent Labs Lab 12/21/16 1716 12/21/16 2059 12/22/16 0724 12/22/16 1142 12/22/16 1616  GLUCAP 106* 149* 95 122* 159*    Recent Results (from the  past 240 hour(s))  Surgical pcr screen     Status: None   Collection Time: 12/16/16  1:17 PM  Result Value Ref Range Status   MRSA, PCR NEGATIVE NEGATIVE Final   Staphylococcus aureus NEGATIVE NEGATIVE Final    Comment:        The Xpert SA Assay (FDA approved for NASAL specimens in patients over 63 years of age), is one component of a comprehensive surveillance program.  Test performance has been validated by [facility] for patients greater than or equal to 32 year old. It is not intended to diagnose infection nor to guide or monitor treatment.      Studies: Ct Abdomen Pelvis Wo Contrast  Result Date:  12/22/2016 CLINICAL DATA:  Abdominal pain.  Kyphoplasty earlier this day. EXAM: CT ABDOMEN AND PELVIS WITHOUT CONTRAST TECHNIQUE: Multidetector CT imaging of the abdomen and pelvis was performed following the standard protocol without IV contrast. COMPARISON:  Abdominal CT 01/31/2016 FINDINGS: Lower chest: Linear atelectasis or scarring at the lung bases. Enteric contrast in the distal esophagus. Hepatobiliary: No focal liver abnormality is seen. No gallstones, gallbladder wall thickening, or biliary dilatation. Pancreas: No ductal dilatation or inflammation. Spleen: Normal in size without focal abnormality. Adrenals/Urinary Tract: Normal adrenal glands. No hydronephrosis or perinephric edema. There is renal parenchymal thinning. No urolithiasis. Urinary bladder is physiologically distended without bladder wall thickening. Stomach/Bowel: Stomach distended with enteric contrast. No gastric wall thickening. No small bowel obstruction or inflammation, enteric contrast reaches the colon. Normal appendix. Small volume of colonic stool without colonic wall thickening. Diverticulosis from the splenic flexure distally. No acute diverticulitis. Vascular/Lymphatic: Mild aortic atherosclerosis without aneurysm. Infrarenal IVC filter in place. No intra-abdominal or pelvic adenopathy. Reproductive: Prostate is unremarkable. Other: No free air, free fluid, or intra-abdominal fluid collection. Fat within both inguinal canals. Tiny fat containing umbilical hernia. Musculoskeletal: T8 compression fracture is chronic as seen on thoracic spine MRI 12/02/2016. The treated T6 kyphoplasty is not included in the field of view. Multilevel degenerative disc disease and facet arthropathy throughout the lumbar spine. Fractures of right anterior ribs have some surrounding callus formation, subacute or chronic, unchanged from recent chest CT. IMPRESSION: 1. Enteric contrast in the distal esophagus, can be seen with reflux or delayed transit. 2.  Colonic diverticulosis without acute inflammation. 3. Aortic atherosclerosis without aneurysm.  IVC filter in place. Electronically Signed   By: Rubye Oaks M.D.   On: 12/22/2016 02:43   Dg Thoracic Spine 1 View  Result Date: 12/21/2016 CLINICAL DATA:  T6 compression fracture, elective surgery EXAM: OPERATIVE THORACIC SPINE 3 VIEW(S) COMPARISON:  MRI from 12/02/2016 FINDINGS: Fluoroscopic guidance provided during a mid thoracic kyphoplasty procedure presumably at the T6 vertebra based on morphology in comparison with prior MRI from 12/02/16. Minimal extravasation of cement is seen along the anterior inferior endplate. 12.1 mGy utilized with 1 minutes 51 seconds of fluoroscopic time utilized. IMPRESSION: Kyphoplasty of a mid thoracic vertebral body consistent with the T6 vertebral body based on prior MRI. Electronically Signed   By: Tollie Eth M.D.   On: 12/21/2016 17:44   Dg C-arm 1-60 Min  Result Date: 12/21/2016 CLINICAL DATA:  T6 compression fracture, elective surgery EXAM: OPERATIVE THORACIC SPINE 3 VIEW(S) COMPARISON:  MRI from 12/02/2016 FINDINGS: Fluoroscopic guidance provided during a mid thoracic kyphoplasty procedure presumably at the T6 vertebra based on morphology in comparison with prior MRI from 12/02/16. Minimal extravasation of cement is seen along the anterior inferior endplate. 12.1 mGy utilized with 1 minutes 51 seconds  of fluoroscopic time utilized. IMPRESSION: Kyphoplasty of a mid thoracic vertebral body consistent with the T6 vertebral body based on prior MRI. Electronically Signed   By: Tollie Eth M.D.   On: 12/21/2016 17:44    Scheduled Meds: . abacavir-dolutegravir-lamiVUDine  1 tablet Oral QPC breakfast  . atorvastatin  10 mg Oral QPC breakfast  . calcium-vitamin D  1 tablet Oral QPC breakfast  . carvedilol  12.5 mg Oral BID WC  . docusate sodium  100 mg Oral BID  . escitalopram  20 mg Oral QPC breakfast  . furosemide  20 mg Oral Daily  . ipratropium-albuterol  3  mL Nebulization Q6H  . linaclotide  72 mcg Oral QPC breakfast  . loratadine  10 mg Oral Daily  . magnesium oxide  400 mg Oral QPC breakfast  . methylPREDNISolone (SOLU-MEDROL) injection  40 mg Intravenous Daily  . mirtazapine  15 mg Oral QPM  . mometasone-formoterol  2 puff Inhalation BID  . multivitamin with minerals  1 tablet Oral Q supper  . pantoprazole  80 mg Oral QAC breakfast  . potassium chloride  10 mEq Oral Daily  . tiotropium  18 mcg Inhalation QPC breakfast  . [START ON 12/24/2016] warfarin  2 mg Oral Once per day on Fri Sat   And  . warfarin  4 mg Oral Once per day on Sun Mon Tue Wed Thu  . Warfarin - Physician Dosing Inpatient   Does not apply q1800  . zolpidem  5 mg Oral QHS   Continuous Infusions: . methocarbamol (ROBAXIN)  IV      Assessment/Plan:  1. COPD exacerbation. Continue IV Solu-Medrol 40 mg IV daily. Standing dose DuoNeb nebulizers. Continue dulera inhaler. Reevaluate daily to see if he is able to go home. 2. Abdominal pain. CT scan negative. Abdomen less distended today.  Send off stool studies, if any further diarrhea. 3. History of DVT and pulmonary emboli. Restarted Coumadin. Also has a history of an IVC filter. 4. Status post kyphoplasty continue pain medications. 5. HIV continue usual haart medication 6. History of collagen vascular disease, chest wall pain. Steroid may help. 7. Hyperlipidemia unspecified on atorvastatin 8. GERD on Protonix  Code Status:     Code Status Orders        Start     Ordered   12/21/16 1815  Do not attempt resuscitation (DNR)  Continuous    Question Answer Comment  In the event of cardiac or respiratory ARREST Do not call a "code blue"   In the event of cardiac or respiratory ARREST Do not perform Intubation, CPR, defibrillation or ACLS   In the event of cardiac or respiratory ARREST Use medication by any route, position, wound care, and other measures to relive pain and suffering. May use oxygen, suction and manual  treatment of airway obstruction as needed for comfort.      12/21/16 1814    Code Status History    Date Active Date Inactive Code Status Order ID Comments User Context   05/05/2016 10:17 PM 05/12/2016  6:20 PM DNR [id number]  Altamese Dilling, MD Inpatient   03/15/2016  5:42 PM 03/17/2016  6:59 PM DNR [id number]  Auburn Bilberry, MD ED   03/15/2016  5:29 PM 03/15/2016  5:42 PM Full Code [id number]  Auburn Bilberry, MD ED   01/30/2016 12:55 AM 02/03/2016 12:55 AM DNR [id number]  Gery Pray, MD Inpatient   01/13/2016  9:46 PM 01/16/2016  4:18 PM Full Code [id number]  Shaune Pollack, MD Inpatient  06/24/2015  7:13 AM 06/25/2015 12:27 PM Full Code [id number]  Milagros LollSudini, Srikar, MD ED    Advance Directive Documentation     Most Recent Value  Type of Advance Directive  Living will  Pre-existing out of facility DNR order (yellow form or pink MOST form)  -  "MOST" Form in Place?  -     Disposition Plan: Case discussed with Dr. Neomia GlassMenz's physician's assistant Amador Cunashomas Gaines  Consultants:  Orthopedic surgery  General surgery  Procedures:  Kyphoplasty  Time spent: 24 minutes  Alford HighlandWIETING, Milton Sagona  [facility]

## 2016-12-24 DIAGNOSIS — G8918 Other acute postprocedural pain: Secondary | ICD-10-CM

## 2016-12-24 DIAGNOSIS — Z7901 Long term (current) use of anticoagulants: Secondary | ICD-10-CM | POA: Diagnosis not present

## 2016-12-24 DIAGNOSIS — I2782 Chronic pulmonary embolism: Secondary | ICD-10-CM | POA: Diagnosis not present

## 2016-12-24 DIAGNOSIS — R1084 Generalized abdominal pain: Secondary | ICD-10-CM

## 2016-12-24 LAB — PROTIME-INR
INR: 1.27
Prothrombin Time: 16 seconds — ABNORMAL HIGH (ref 11.4–15.2)

## 2016-12-24 MED ORDER — HYDROCODONE-ACETAMINOPHEN 5-325 MG PO TABS: 1.0000 | ORAL_TABLET | ORAL | 0 refills | Status: DC | PRN

## 2016-12-24 NOTE — Evaluation (Signed)
Physical Therapy Evaluation Patient Details Name: Brian Hampton MRN: [id number] DOB: August 08, 1956 Today's Date: 12/24/2016   History of Present Illness  61 y/o male s/p kyphoplasty 5/15.  Clinical Impression  Pt did well with PT exam and showed ability to ambulate and manage around the home upon discharge.  He lives alone but reports that he has plenty of people who can help him if he needs assist.  Pt with no safety issues with prolonged ambulation, no hesitation with mobility and ultimately should be able to return home w/o continued PT f/u.    Follow Up Recommendations No PT follow up    Equipment Recommendations       Recommendations for Other Services       Precautions / Restrictions Restrictions Weight Bearing Restrictions: No      Mobility  Bed Mobility Overal bed mobility: Independent             General bed mobility comments: Pt easily able to get himself to EOB w/o assist  Transfers Overall transfer level: Independent Equipment used: None             General transfer comment: Pt stood with good confidence and safety, no issues.  Ambulation/Gait Ambulation/Gait assistance: Supervision Ambulation Distance (Feet): 200 Feet Assistive device: None       General Gait Details: Pt walked with consistent and appropriate cadence/speed.  Overall he showed good confidence and safety and though he was a little guarded he stated he was not far from his baseline and generally felt fine.   Stairs            Wheelchair Mobility    Modified Rankin (Stroke Patients Only)       Balance Overall balance assessment: Independent                                           Pertinent Vitals/Pain Pain Assessment: 0-10 Pain Score: 4     Home Living Family/patient expects to be discharged to:: Private residence Living Arrangements: Alone Available Help at Discharge: Personal care attendant Type of Home: Mobile home       Home Layout: One  level Home Equipment: Walker - 2 wheels;Walker - 4 wheels;Cane - quad      Prior Function Level of Independence: Independent         Comments: PLOF indep in ADL and limited community distances.      Hand Dominance        Extremity/Trunk Assessment   Upper Extremity Assessment Upper Extremity Assessment: Overall WFL for tasks assessed    Lower Extremity Assessment Lower Extremity Assessment: Overall WFL for tasks assessed       Communication   Communication: No difficulties  Cognition Arousal/Alertness: Awake/alert Behavior During Therapy: WFL for tasks assessed/performed Overall Cognitive Status: Within Functional Limits for tasks assessed                                        General Comments      Exercises     Assessment/Plan    PT Assessment Patent does not need any further PT services  PT Problem List         PT Treatment Interventions      PT Goals (Current goals can be found in the Care Plan section)  Acute Rehab PT Goals Patient Stated Goal: go home this morning PT Goal Formulation: All assessment and education complete, DC therapy    Frequency     Barriers to discharge        Co-evaluation               AM-PAC PT "6 Clicks" Daily Activity  Outcome Measure Difficulty turning over in bed (including adjusting bedclothes, sheets and blankets)?: None Difficulty moving from lying on back to sitting on the side of the bed? : None Difficulty sitting down on and standing up from a chair with arms (e.g., wheelchair, bedside commode, etc,.)?: None Help needed moving to and from a bed to chair (including a wheelchair)?: None Help needed walking in hospital room?: None Help needed climbing 3-5 steps with a railing? : None 6 Click Score: 24    End of Session Equipment Utilized During Treatment: Gait belt Activity Tolerance: Patient tolerated treatment well Patient left: in chair;with call bell/phone within reach Nurse  Communication: Mobility status PT Visit Diagnosis: Difficulty in walking, not elsewhere classified (R26.2)    Time: [id number] PT Time Calculation (min) (ACUTE ONLY): 13 min   Charges:   PT Evaluation $PT Eval Low Complexity: 1 Procedure     PT G CodesMalachi Pro:        Shauntay Brunelli R Oneita Allmon, DPT 12/24/2016, 9:50 AM

## 2016-12-24 NOTE — Care Management Important Message (Signed)
Important Message  Patient Details  Name: Brian Hampton MRN: [id number] Date of Birth: 1955-08-13   Medicare Important Message Given:  Yes    Marily MemosLisa M Aryannah Mohon, RN 12/24/2016, 9:13 AM

## 2016-12-24 NOTE — Discharge Instructions (Signed)
INSTRUCTIONS AFTER Surgery  o Remove items at home which could result in a fall. This includes throw rugs or furniture in walking pathways o ICE to the affected joint every three hours while awake for 30 minutes at a time, for at least the first 3-5 days, and then as needed for pain and swelling.  Continue to use ice for pain and swelling. You may notice swelling that will progress down to the foot and ankle.  This is normal after surgery.  Elevate your leg when you are not up walking on it.   o Continue to use the breathing machine you got in the hospital (incentive spirometer) which will help keep your temperature down.  It is common for your temperature to cycle up and down following surgery, especially at night when you are not up moving around and exerting yourself.  The breathing machine keeps your lungs expanded and your temperature down.   DIET:  As you were doing prior to hospitalization, we recommend a well-balanced diet.  DRESSING / WOUND CARE / SHOWERING  Keep a Band-Aid in place, and keep dry for several days.  ACTIVITY  o Increase activity slowly as tolerated, but follow the weight bearing instructions below.   o No driving for 6 weeks or until further direction given by your physician.  You cannot drive while taking narcotics.  o No lifting or carrying greater than 10 lbs. until further directed by your surgeon. o Avoid periods of inactivity such as sitting longer than an hour when not asleep. This helps prevent blood clots.  o You may return to work once you are authorized by your doctor.     WEIGHT BEARING  Weight-bearing as tolerated   EXERCISES Try to limit lumbar for back exercises. Do exercises to tolerance.  CONSTIPATION  Constipation is defined medically as fewer than three stools per week and severe constipation as less than one stool per week.  Even if you have a regular bowel pattern at home, your normal regimen is likely to be disrupted due to multiple  reasons following surgery.  Combination of anesthesia, postoperative narcotics, change in appetite and fluid intake all can affect your bowels.   YOU MUST use at least one of the following options; they are listed in order of increasing strength to get the job done.  They are all available over the counter, and you may need to use some, POSSIBLY even all of these options:    Drink plenty of fluids (prune juice may be helpful) and high fiber foods Colace 100 mg by mouth twice a day  Senokot for constipation as directed and as needed Dulcolax (bisacodyl), take with full glass of water  Miralax (polyethylene glycol) once or twice a day as needed.  If you have tried all these things and are unable to have a bowel movement in the first 3-4 days after surgery call either your surgeon or your primary doctor.    If you experience loose stools or diarrhea, hold the medications until you stool forms back up.  If your symptoms do not get better within 1 week or if they get worse, check with your doctor.  If you experience "the worst abdominal pain ever" or develop nausea or vomiting, please contact the office immediately for further recommendations for treatment.   ITCHING:  If you experience itching with your medications, try taking only a single pain pill, or even half a pain pill at a time.  You can also use Benadryl over the  counter for itching or also to help with sleep.   TED HOSE STOCKINGS:  Use stockings on both legs until for at least 2 weeks or as directed by physician office. They may be removed at night for sleeping.  MEDICATIONS:  See your medication summary on the After Visit Summary that nursing will review with you.  You may have some home medications which will be placed on hold until you complete the course of blood thinner medication.  It is important for you to complete the blood thinner medication as prescribed.  PRECAUTIONS:  If you experience chest pain or shortness of breath - call  [phone] immediately for transfer to the hospital emergency department.   If you develop a fever greater that 101 F, purulent drainage from wound, increased redness or drainage from wound, foul odor from the wound/dressing, or calf pain - CONTACT YOUR SURGEON.                                                   FOLLOW-UP APPOINTMENTS:  If you do not already have a post-op appointment, please call the office for an appointment to be seen by your surgeon.  Guidelines for how soon to be seen are listed in your After Visit Summary, but are typically between 1-4 weeks after surgery.  OTHER INSTRUCTIONS:     MAKE SURE YOU:   Understand these instructions.   Get help right away if you are not doing well or get worse.    Thank you for letting us be a part of your medical care team.  It is a privilege we respect greatly.  We hope these instructions will help you stay on track for a fast and full recovery!

## 2016-12-24 NOTE — Discharge Summary (Signed)
Physician Discharge Summary  Subjective: 3 Days Post-Op Procedure(s) (LRB): KYPHOPLASTY (N/A) Patient reports pain as mild.   Patient seen in rounds with Dr. Rudene Christians. Patient is well, and has had no acute complaints or problems Patient is ready to go Home.  Physician Discharge Summary  Patient ID: Brian Hampton MRN: [id number] DOB/AGE: 60-May-1957 60 y.o.  Admit date: 12/21/2016 Discharge date: 12/24/2016  Admission Diagnoses:  Discharge Diagnoses:  Active Problems:   Compression fracture of body of thoracic vertebra (HCC)   Abdominal pain   COPD exacerbation [facility])   Discharged Condition: good  Hospital Course: The patient is status post sixth of done on 12/21/2016. The patient is doing well since surgery. He denies any specific pain. He is ambulating to the bathroom. He has not done a lot of physical therapy but is feeling much improved. He is ready to go home.  Treatments: surgery:  KYPHOPLASTY (N/A) sixth vertebrae thoracic spine  SURGEON: Laurene Footman, MD  ASSISTANTS: None  ANESTHESIA:   local and MAC  EBL:  Total I/O In: 400 [I.V.:400] Out: 0   BLOOD ADMINISTERED:none  DRAINS: none   LOCAL MEDICATIONS USED:  MARCAINE    and XYLOCAINE   SPECIMEN:  Source of Specimen:  T6 vertebral body  DISPOSITION OF SPECIMEN:  PATHOLOGY  COUNTS:  YES  TOURNIQUET:  * No tourniquets in log *  IMPLANTS: Bone cement  Discharge Exam: Blood pressure 114/76, pulse 79, temperature 98.1 F (36.7 C), temperature source Oral, resp. rate 19, height 5' 5"  (1.651 m), weight 83.9 kg (185 lb), SpO2 95 %.   Disposition: 01-Home or Self Care   Allergies as of 12/24/2016      Reactions   Aspirin Anaphylaxis   Bee Venom Anaphylaxis   Penicillins Anaphylaxis, Other (See Comments)   Has patient had a PCN reaction causing immediate rash, facial/tongue/throat swelling, SOB or lightheadedness with hypotension: Yes Has patient had a PCN reaction causing severe rash involving  mucus membranes or skin necrosis: No Has patient had a PCN reaction that required hospitalization No Has patient had a PCN reaction occurring within the last 10 years: Yes If all of the above answers are "NO", then may proceed with Cephalosporin use.   Prednisone Anaphylaxis   Sulfa Antibiotics Anaphylaxis   Sulfasalazine Anaphylaxis   Theophyllines Anaphylaxis   Theophylline Swelling      Medication List    TAKE these medications   abacavir-dolutegravir-lamiVUDine 600-50-300 MG tablet Commonly known as:  TRIUMEQ Take 1 tablet by mouth daily after breakfast. Reported on 01/30/2016   albuterol 108 (90 Base) MCG/ACT inhaler Commonly known as:  PROVENTIL HFA;VENTOLIN HFA Inhale 2 puffs into the lungs every 6 (six) hours as needed for wheezing or shortness of breath.   albuterol (2.5 MG/3ML) 0.083% nebulizer solution Commonly known as:  PROVENTIL Take 2.5 mg by nebulization 3 (three) times daily.   atorvastatin 10 MG tablet Commonly known as:  LIPITOR Take 10 mg by mouth daily after breakfast.   CALCIUM 600+D 600-400 MG-UNIT tablet Generic drug:  Calcium Carbonate-Vitamin D Take 1 tablet by mouth daily after breakfast.   carvedilol 12.5 MG tablet Commonly known as:  COREG Take 12.5 mg by mouth 2 (two) times daily with a meal.   cetirizine 10 MG tablet Commonly known as:  ZYRTEC Take 10 mg by mouth daily as needed for allergies.   DULERA 200-5 MCG/ACT Aero Generic drug:  mometasone-formoterol Inhale 2 puffs into the lungs 2 (two) times daily as needed for wheezing or shortness  of breath.   enoxaparin 150 MG/ML injection Commonly known as:  LOVENOX Inject 0.44 mLs (65 mg total) into the skin every 12 (twelve) hours.   EPIPEN 2-PAK 0.3 mg/0.3 mL Soaj injection Generic drug:  EPINEPHrine Inject 0.3 mg into the muscle once as needed (for severe allergic reaction).   escitalopram 20 MG tablet Commonly known as:  LEXAPRO Take 20 mg by mouth daily after breakfast.    furosemide 20 MG tablet Commonly known as:  LASIX Take 20 mg by mouth daily.   HYDROcodone-acetaminophen 5-325 MG tablet Commonly known as:  NORCO Take 1 tablet by mouth every 4 (four) hours as needed for moderate pain.   levalbuterol 45 MCG/ACT inhaler Commonly known as:  XOPENEX HFA Inhale 1 puff into the lungs 3 (three) times daily.   LINZESS 72 MCG capsule Generic drug:  linaclotide Take 72 mcg by mouth daily after breakfast.   magnesium oxide 400 MG tablet Commonly known as:  MAG-OX Take 400 mg by mouth daily after breakfast.   meloxicam 7.5 MG tablet Commonly known as:  MOBIC Take 7.5 mg by mouth daily as needed for pain.   mirtazapine 15 MG tablet Commonly known as:  REMERON Take 15 mg by mouth every evening.   multivitamin with minerals Tabs tablet Take 1 tablet by mouth daily. ONE-A-DAY MULTIVITAMIN 50+   nitroGLYCERIN 0.4 MG SL tablet Commonly known as:  NITROSTAT Place 0.4 mg under the tongue every 5 (five) minutes as needed for chest pain.   omeprazole 40 MG capsule Commonly known as:  PRILOSEC Take 40 mg by mouth daily after breakfast.   Potassium Chloride CR 8 MEQ Cpcr capsule CR Commonly known as:  MICRO-K Take 1 capsule by mouth daily after breakfast.   senna-docusate 8.6-50 MG tablet Commonly known as:  Senokot-S Take 1 tablet by mouth at bedtime as needed for mild constipation.   tiotropium 18 MCG inhalation capsule Commonly known as:  SPIRIVA Place 18 mcg into inhaler and inhale daily after breakfast.   traMADol 50 MG tablet Commonly known as:  ULTRAM Take 1 tablet (50 mg total) by mouth every 6 (six) hours as needed. What changed:  when to take this   warfarin 4 MG tablet Commonly known as:  COUMADIN Take 4 mg by mouth daily. Take 1/2 tablet (2 MG) on Friday, Saturday, & Take 1 tablet (4 MG) on Sunday,  Monday, Tuesday, Wednesday, & Thursday   zolpidem 5 MG tablet Commonly known as:  AMBIEN Take 5 mg by mouth at bedtime.         Signed: ,  12/24/2016, 6:36 AM   Objective: Vital signs in last 24 hours: Temp:  [98.1 F (36.7 C)-98.7 F (37.1 C)] 98.1 F (36.7 C) (05/18 0001) Pulse Rate:  [78-96] 79 (05/18 0001) Resp:  [18-19] 19 (05/18 0001) BP: (114-143)/(76-90) 114/76 (05/18 0001) SpO2:  [91 %-97 %] 95 % (05/18 0001)  Intake/Output from previous day:  Intake/Output Summary (Last 24 hours) at 12/24/16 0636 Last data filed at 12/23/16 1503  Gross per 24 hour  Intake                0 ml  Output              40 0 ml  Net             -400 ml    Intake/Output this shift: No intake/output data recorded.  Labs:  Recent Labs  12/21/16 2251  HGB 13.5    Recent Labs  12/21/16 2251  WBC 9.4  RBC 4.12*  HCT 39.5*  PLT 218    Recent Labs  12/21/16 2251  NA 137  K 3.3*  CL 103  CO2 27  BUN 22*  CREATININE 1.30*  GLUCOSE 144*  CALCIUM 8.7*    Recent Labs  12/23/16 0453 12/24/16 0457  INR 1.13 1.27    EXAM: General - Patient is Alert and Oriented Extremity - Neurovascular intact No cellulitis present Incision - clean, dry, no drainage Motor Function -  the patient is able to move his feet with plantarflexion and dorsiflexion.  Assessment/Plan: 3 Days Post-Op Procedure(s) (LRB): KYPHOPLASTY (N/A) Procedure(s) (LRB): KYPHOPLASTY (N/A) Past Medical History:  Diagnosis Date  . Anemia   . Asthma   . Cataracts, bilateral    worse in Rt eye  . Collagen vascular disease ([place])   . COPD (chronic obstructive pulmonary disease) ([place])   . Coronary artery disease   . Depression   . DVT (deep venous thrombosis) ([place])   . Dysrhythmia   . Emphysema/COPD ([place])   . Environmental and seasonal allergies   . H/O blood clots   . Heart murmur   . HIV (human immunodeficiency virus infection) ([place])   . Hypertension   . Leaky heart valve    x 3  . Lung mass   . Myocardial infarction ([place])    in 2000  . Pneumonia    year ago  . Pulmonary emboli ([place])   . Type 2  diabetes mellitus (HCC)    Active Problems:   Compression fracture of body of thoracic vertebra (HCC)   Abdominal pain   COPD exacerbation (HCC)  Estimated body mass index is 30.79 kg/m as calculated from the following:   Height as of this encounter: 5' 5"  (1.651 m).   Weight as of this encounter: 83.9 kg (185 lb). Advance diet  Discharge home today. No physical therapy at home. Diet - Regular diet Follow up - in 2 weeks Activity - WBAT Disposition - Home Condition Upon Discharge - Good DVT Prophylaxis - Lovenox  Reche Dixon, PA-C Orthopaedic Surgery 12/24/2016, 6:36 AM

## 2016-12-24 NOTE — Progress Notes (Signed)
Patient ID: Brian Hampton, male   DOB: [birth year], 61 y.o.   MRN: [id number]   [facility] PROGRESS NOTE  Brian Hampton [id number] DOB: [birth year] DOA: 12/21/2016 PCP: Tonye RoyaltyHugelmeyer, Alexis, DO  HPI/Subjective: Patient states is feeling a lot better. No wheezing today. Breathing better. Had 2 bowel movements yesterday and abdominal pain is resolved. Back pain is okay. No further chest pain.  Objective: Vitals:   12/24/16 0001 12/24/16 0721  BP: 114/76 (!) 145/92  Pulse: 79 70  Resp: 19 19  Temp: 98.1 F (36.7 C) 97.8 F (36.6 C)    Filed Weights   12/21/16 1334  Weight: 83.9 kg (185 lb)    ROS: Review of Systems  Constitutional: Negative for chills and fever.  Eyes: Negative for blurred vision.  Respiratory: Positive for shortness of breath. Negative for cough and wheezing.   Cardiovascular: Negative for chest pain.  Gastrointestinal: Negative for abdominal pain, constipation, diarrhea, nausea and vomiting.  Genitourinary: Negative for dysuria.  Musculoskeletal: Negative for joint pain.  Neurological: Negative for dizziness and headaches.   Exam: Physical Exam  Constitutional: He is oriented to person, place, and time.  HENT:  Nose: No mucosal edema.  Mouth/Throat: No oropharyngeal exudate or posterior oropharyngeal edema.  Eyes: Conjunctivae, EOM and lids are normal. Pupils are equal, round, and reactive to light.  Neck: No JVD present. Carotid bruit is not present. No edema present. No thyroid mass and no thyromegaly present.  Cardiovascular: S1 normal and S2 normal.  Exam reveals no gallop.   No murmur heard. Pulses:      Dorsalis pedis pulses are 2+ on the right side, and 2+ on the left side.  Pain to palpation over the chest wall.  Respiratory: No respiratory distress. He has decreased breath sounds in the right lower field and the left lower field. He has no wheezes. He has no rhonchi. He has no rales.  GI: Soft. Bowel sounds are normal. He exhibits no  distension. There is no tenderness.  Musculoskeletal:       Right ankle: He exhibits swelling.       Left ankle: He exhibits swelling.  Lymphadenopathy:    He has no cervical adenopathy.  Neurological: He is alert and oriented to person, place, and time. No cranial nerve deficit.  Skin: Skin is warm. No rash noted. Nails show no clubbing.  Psychiatric: He has a normal mood and affect.      Data Reviewed: Basic Metabolic Panel:  Recent Labs Lab 12/21/16 2251  NA 137  K 3.3*  CL 103  CO2 27  GLUCOSE 144*  BUN 22*  CREATININE 1.30*  CALCIUM 8.7*   Liver Function Tests:  Recent Labs Lab 12/21/16 2251  AST 24  ALT 21  ALKPHOS 198*  BILITOT 0.6  PROT 7.8  ALBUMIN 3.7   CBC:  Recent Labs Lab 12/21/16 2251  WBC 9.4  HGB 13.5  HCT 39.5*  MCV 95.8  PLT 218   Cardiac Enzymes:  Recent Labs Lab 12/21/16 2236  TROPONINI <0.03   BNP (last 3 results)  Recent Labs  03/15/16 1549  BNP 109.0*   CBG:  Recent Labs Lab 12/21/16 1716 12/21/16 2059 12/22/16 0724 12/22/16 1142 12/22/16 1616  GLUCAP 106* 149* 95 122* 159*    Recent Results (from the past 240 hour(s))  Surgical pcr screen     Status: None   Collection Time: 12/16/16  1:17 PM  Result Value Ref Range Status   MRSA, PCR NEGATIVE NEGATIVE Final  Staphylococcus aureus NEGATIVE NEGATIVE Final    Comment:        The Xpert SA Assay (FDA approved for NASAL specimens in patients over 54 years of age), is one component of a comprehensive surveillance program.  Test performance has been validated by [facility] for patients greater than or equal to 74 year old. It is not intended to diagnose infection nor to guide or monitor treatment.   Gastrointestinal Panel by PCR , Stool     Status: None   Collection Time: 12/22/16  3:41 PM  Result Value Ref Range Status   Campylobacter species NOT DETECTED NOT DETECTED Final   Plesimonas shigelloides NOT DETECTED NOT DETECTED Final   Salmonella  species NOT DETECTED NOT DETECTED Final   Yersinia enterocolitica NOT DETECTED NOT DETECTED Final   Vibrio species NOT DETECTED NOT DETECTED Final   Vibrio cholerae NOT DETECTED NOT DETECTED Final   Enteroaggregative E coli (EAEC) NOT DETECTED NOT DETECTED Final   Enteropathogenic E coli (EPEC) NOT DETECTED NOT DETECTED Final   Enterotoxigenic E coli (ETEC) NOT DETECTED NOT DETECTED Final   Shiga like toxin producing E coli (STEC) NOT DETECTED NOT DETECTED Final   Shigella/Enteroinvasive E coli (EIEC) NOT DETECTED NOT DETECTED Final   Cryptosporidium NOT DETECTED NOT DETECTED Final   Cyclospora cayetanensis NOT DETECTED NOT DETECTED Final   Entamoeba histolytica NOT DETECTED NOT DETECTED Final   Giardia lamblia NOT DETECTED NOT DETECTED Final   Adenovirus F40/41 NOT DETECTED NOT DETECTED Final   Astrovirus NOT DETECTED NOT DETECTED Final   Norovirus GI/GII NOT DETECTED NOT DETECTED Final   Rotavirus A NOT DETECTED NOT DETECTED Final   Sapovirus (I, II, IV, and V) NOT DETECTED NOT DETECTED Final  C difficile quick scan w PCR reflex     Status: None   Collection Time: 12/22/16  3:41 PM  Result Value Ref Range Status   C Diff antigen NEGATIVE NEGATIVE Final   C Diff toxin NEGATIVE NEGATIVE Final   C Diff interpretation No C. difficile detected.  Final     Scheduled Meds: . abacavir-dolutegravir-lamiVUDine  1 tablet Oral QPC breakfast  . atorvastatin  10 mg Oral QPC breakfast  . calcium-vitamin D  1 tablet Oral QPC breakfast  . carvedilol  12.5 mg Oral BID WC  . docusate sodium  100 mg Oral BID  . escitalopram  20 mg Oral QPC breakfast  . furosemide  20 mg Oral Daily  . ipratropium-albuterol  3 mL Nebulization Q6H  . linaclotide  72 mcg Oral QPC breakfast  . loratadine  10 mg Oral Daily  . magnesium oxide  400 mg Oral QPC breakfast  . methylPREDNISolone (SOLU-MEDROL) injection  40 mg Intravenous Daily  . mirtazapine  15 mg Oral QPM  . mometasone-formoterol  2 puff Inhalation BID   . multivitamin with minerals  1 tablet Oral Q supper  . pantoprazole  80 mg Oral QAC breakfast  . potassium chloride  10 mEq Oral Daily  . tiotropium  18 mcg Inhalation QPC breakfast  . warfarin  2 mg Oral Once per day on Fri Sat   And  . warfarin  4 mg Oral Once per day on Sun Mon Tue Wed Thu  . Warfarin - Physician Dosing Inpatient   Does not apply q1800  . zolpidem  5 mg Oral QHS   Continuous Infusions: . methocarbamol (ROBAXIN)  IV      Assessment/Plan:  1. COPD exacerbation. I asked the nurse to give IV Solu-Medrol before  he goes home today. Since he has an allergy to prednisone which causes swelling in the neck I am hesitant about oral steroids about going home. Since his lungs sound much better today I think we will be okay just giving a short burst of Solu-Medrol. He will go back on his Dulera inhaler and albuterol inhaler at home. 2. Abdominal pain. CT scan negative. Resolved. Had 2 bowel movements yesterday with negative results on stool studies. 3. History of DVT and pulmonary emboli. Restarted Coumadin. Also has a history of an IVC filter. 4. Status post kyphoplasty continue pain medications. 5. HIV continue usual haart medication 6. History of collagen vascular disease, chest wall pain. Steroid may help. 7. Hyperlipidemia unspecified on atorvastatin 8. GERD on Protonix  Code Status:     Code Status Orders        Start     Ordered   12/21/16 1815  Do not attempt resuscitation (DNR)  Continuous    Question Answer Comment  In the event of cardiac or respiratory ARREST Do not call a "code blue"   In the event of cardiac or respiratory ARREST Do not perform Intubation, CPR, defibrillation or ACLS   In the event of cardiac or respiratory ARREST Use medication by any route, position, wound care, and other measures to relive pain and suffering. May use oxygen, suction and manual treatment of airway obstruction as needed for comfort.      12/21/16 1814    Code Status  History    Date Active Date Inactive Code Status Order ID Comments User Context   05/05/2016 10:17 PM 05/12/2016  6:20 PM DNR [id number]  Altamese Dilling, MD Inpatient   03/15/2016  5:42 PM 03/17/2016  6:59 PM DNR [id number]  Auburn Bilberry, MD ED   03/15/2016  5:29 PM 03/15/2016  5:42 PM Full Code [id number]  Auburn Bilberry, MD ED   01/30/2016 12:55 AM 02/03/2016 12:55 AM DNR [id number]  Gery Pray, MD Inpatient   01/13/2016  9:46 PM 01/16/2016  4:18 PM Full Code [id number]  Shaune Pollack, MD Inpatient   06/24/2015  7:13 AM 06/25/2015 12:27 PM Full Code [id number]  Milagros Loll, MD ED    Advance Directive Documentation     Most Recent Value  Type of Advance Directive  Living will  Pre-existing out of facility DNR order (yellow form or pink MOST form)  -  "MOST" Form in Place?  -     Disposition Plan: Agree with discharge home today  Consultants:  Orthopedic surgery  General surgery  Procedures:  Kyphoplasty  Time spent: 22 minutes  Alford Highland  [employer]

## 2016-12-24 NOTE — Progress Notes (Signed)
Patient is being discharged to home.  DC and RX instructions given and patient acknowledged understanding.  IV removed. Patient called sister who will be picking him up. She should be here at 1100

## 2016-12-24 NOTE — Progress Notes (Signed)
CC: Left-sided pain Subjective: Patient feels much better today in fact he is being discharged today. No nausea vomiting fevers or chills  Objective: Vital signs in last 24 hours: Temp:  [97.8 F (36.6 C)-98.7 F (37.1 C)] 97.8 F (36.6 C) (05/18 0721) Pulse Rate:  [70-96] 70 (05/18 0721) Resp:  [19] 19 (05/18 0721) BP: (114-145)/(76-92) 145/92 (05/18 0721) SpO2:  [91 %-96 %] 93 % (05/18 0803) Last BM Date: 12/23/16  Intake/Output from previous day: 05/17 0701 - 05/18 0700 In: -  Out: 400 [Urine:400] Intake/Output this shift: Total I/O In: 480 [P.O.:480] Out: -   Physical exam: Abdomen is soft and nontender slightly distended awake alert and oriented vital signs reviewed  Lab Results: CBC   Recent Labs  12/21/16 2251  WBC 9.4  HGB 13.5  HCT 39.5*  PLT 218   BMET  Recent Labs  12/21/16 2251  NA 137  K 3.3*  CL 103  CO2 27  GLUCOSE 144*  BUN 22*  CREATININE 1.30*  CALCIUM 8.7*   PT/INR  Recent Labs  12/23/16 0453 12/24/16 0457  LABPROT 14.6 16.0*  INR 1.13 1.27   ABG No results for input(s): PHART, HCO3 in the last 72 hours.  Invalid input(s): PCO2, PO2  Studies/Results: No results found.  Anti-infectives: Anti-infectives    Start     Dose/Rate Route Frequency Ordered Stop   12/22/16 0900  abacavir-dolutegravir-lamiVUDine (TRIUMEQ) 600-50-300 MG per tablet 1 tablet    Comments:  Reported on 01/30/2016     1 tablet Oral Daily after breakfast 12/21/16 1814     12/21/16 1337  clindamycin (CLEOCIN) 900 MG/50ML IVPB    Comments:  KENNEDY, ASHLEY: cabinet override      12/21/16 1337 12/21/16 1626   12/21/16 1335  clindamycin (CLEOCIN) 600 MG/50ML IVPB  Status:  Discontinued    Comments:  KENNEDY, ASHLEY: cabinet override      12/21/16 1335 12/21/16 1332   12/21/16 0245  clindamycin (CLEOCIN) IVPB 900 mg     900 mg 100 mL/hr over 30 Minutes Intravenous  Once 12/21/16 0239 12/21/16 1656      Assessment/Plan:  Patient doing well no sign of  acute surgical process being discharged today we'll follow as needed  Lattie Hawichard E Saber Dickerman, MD, FACS  12/24/2016

## 2017-01-05 DIAGNOSIS — S22050A Wedge compression fracture of T5-T6 vertebra, initial encounter for closed fracture: Secondary | ICD-10-CM | POA: Diagnosis not present

## 2017-01-06 DIAGNOSIS — M5116 Intervertebral disc disorders with radiculopathy, lumbar region: Secondary | ICD-10-CM | POA: Diagnosis not present

## 2017-01-06 DIAGNOSIS — B20 Human immunodeficiency virus [HIV] disease: Secondary | ICD-10-CM | POA: Diagnosis not present

## 2017-01-06 DIAGNOSIS — I2699 Other pulmonary embolism without acute cor pulmonale: Secondary | ICD-10-CM | POA: Diagnosis not present

## 2017-01-06 DIAGNOSIS — I10 Essential (primary) hypertension: Secondary | ICD-10-CM | POA: Diagnosis not present

## 2017-01-06 DIAGNOSIS — Z79899 Other long term (current) drug therapy: Secondary | ICD-10-CM | POA: Diagnosis not present

## 2017-01-06 DIAGNOSIS — J45909 Unspecified asthma, uncomplicated: Secondary | ICD-10-CM | POA: Diagnosis not present

## 2017-01-10 DIAGNOSIS — H469 Unspecified optic neuritis: Secondary | ICD-10-CM | POA: Diagnosis not present

## 2017-01-10 DIAGNOSIS — H25043 Posterior subcapsular polar age-related cataract, bilateral: Secondary | ICD-10-CM | POA: Diagnosis not present

## 2017-01-10 DIAGNOSIS — H2511 Age-related nuclear cataract, right eye: Secondary | ICD-10-CM | POA: Diagnosis not present

## 2017-01-10 DIAGNOSIS — H40013 Open angle with borderline findings, low risk, bilateral: Secondary | ICD-10-CM | POA: Diagnosis not present

## 2017-01-10 DIAGNOSIS — H25013 Cortical age-related cataract, bilateral: Secondary | ICD-10-CM | POA: Diagnosis not present

## 2017-01-10 DIAGNOSIS — H25041 Posterior subcapsular polar age-related cataract, right eye: Secondary | ICD-10-CM | POA: Diagnosis not present

## 2017-01-10 DIAGNOSIS — H2513 Age-related nuclear cataract, bilateral: Secondary | ICD-10-CM | POA: Diagnosis not present

## 2017-01-19 DIAGNOSIS — Z7901 Long term (current) use of anticoagulants: Secondary | ICD-10-CM | POA: Diagnosis not present

## 2017-01-19 DIAGNOSIS — I2782 Chronic pulmonary embolism: Secondary | ICD-10-CM | POA: Diagnosis not present

## 2017-01-25 DIAGNOSIS — H2511 Age-related nuclear cataract, right eye: Secondary | ICD-10-CM | POA: Diagnosis not present

## 2017-01-25 DIAGNOSIS — H25811 Combined forms of age-related cataract, right eye: Secondary | ICD-10-CM | POA: Diagnosis not present

## 2017-01-27 DIAGNOSIS — G8918 Other acute postprocedural pain: Secondary | ICD-10-CM

## 2017-01-31 DIAGNOSIS — H2511 Age-related nuclear cataract, right eye: Secondary | ICD-10-CM | POA: Diagnosis not present

## 2017-02-16 DIAGNOSIS — H2512 Age-related nuclear cataract, left eye: Secondary | ICD-10-CM | POA: Diagnosis not present

## 2017-02-16 DIAGNOSIS — H25012 Cortical age-related cataract, left eye: Secondary | ICD-10-CM | POA: Diagnosis not present

## 2017-02-16 DIAGNOSIS — H2589 Other age-related cataract: Secondary | ICD-10-CM | POA: Diagnosis not present

## 2017-02-16 DIAGNOSIS — H25042 Posterior subcapsular polar age-related cataract, left eye: Secondary | ICD-10-CM | POA: Diagnosis not present

## 2017-02-17 DIAGNOSIS — I2782 Chronic pulmonary embolism: Secondary | ICD-10-CM | POA: Diagnosis not present

## 2017-02-17 DIAGNOSIS — Z7901 Long term (current) use of anticoagulants: Secondary | ICD-10-CM | POA: Diagnosis not present

## 2017-02-22 DIAGNOSIS — H2512 Age-related nuclear cataract, left eye: Secondary | ICD-10-CM | POA: Diagnosis not present

## 2017-02-22 DIAGNOSIS — H25812 Combined forms of age-related cataract, left eye: Secondary | ICD-10-CM | POA: Diagnosis not present

## 2017-02-22 DIAGNOSIS — H2511 Age-related nuclear cataract, right eye: Secondary | ICD-10-CM | POA: Diagnosis not present

## 2017-02-23 DIAGNOSIS — Z7901 Long term (current) use of anticoagulants: Secondary | ICD-10-CM | POA: Diagnosis not present

## 2017-02-23 DIAGNOSIS — I2782 Chronic pulmonary embolism: Secondary | ICD-10-CM | POA: Diagnosis not present

## 2017-02-25 ENCOUNTER — Emergency Department: Payer: Medicare Other

## 2017-02-25 ENCOUNTER — Encounter: Payer: Self-pay | Admitting: Emergency Medicine

## 2017-02-25 ENCOUNTER — Inpatient Hospital Stay
Admission: EM | Admit: 2017-02-25 | Discharge: 2017-02-27 | DRG: 299 | Disposition: A | Discharge status: Home / Self Care | Payer: Medicare Other | Attending: Specialist | Admitting: Specialist

## 2017-02-25 DIAGNOSIS — J449 Chronic obstructive pulmonary disease, unspecified: Secondary | ICD-10-CM | POA: Diagnosis not present

## 2017-02-25 DIAGNOSIS — D6859 Other primary thrombophilia: Secondary | ICD-10-CM | POA: Diagnosis present

## 2017-02-25 DIAGNOSIS — I82501 Chronic embolism and thrombosis of unspecified deep veins of right lower extremity: Secondary | ICD-10-CM | POA: Diagnosis present

## 2017-02-25 DIAGNOSIS — G8929 Other chronic pain: Secondary | ICD-10-CM | POA: Diagnosis present

## 2017-02-25 DIAGNOSIS — E1122 Type 2 diabetes mellitus with diabetic chronic kidney disease: Secondary | ICD-10-CM | POA: Diagnosis present

## 2017-02-25 DIAGNOSIS — Z87891 Personal history of nicotine dependence: Secondary | ICD-10-CM | POA: Diagnosis not present

## 2017-02-25 DIAGNOSIS — I82411 Acute embolism and thrombosis of right femoral vein: Secondary | ICD-10-CM | POA: Diagnosis not present

## 2017-02-25 DIAGNOSIS — Z880 Allergy status to penicillin: Secondary | ICD-10-CM

## 2017-02-25 DIAGNOSIS — Z7901 Long term (current) use of anticoagulants: Secondary | ICD-10-CM

## 2017-02-25 DIAGNOSIS — I252 Old myocardial infarction: Secondary | ICD-10-CM | POA: Diagnosis not present

## 2017-02-25 DIAGNOSIS — I82401 Acute embolism and thrombosis of unspecified deep veins of right lower extremity: Principal | ICD-10-CM | POA: Diagnosis present

## 2017-02-25 DIAGNOSIS — K589 Irritable bowel syndrome without diarrhea: Secondary | ICD-10-CM | POA: Diagnosis present

## 2017-02-25 DIAGNOSIS — R140 Abdominal distension (gaseous): Secondary | ICD-10-CM | POA: Diagnosis not present

## 2017-02-25 DIAGNOSIS — M359 Systemic involvement of connective tissue, unspecified: Secondary | ICD-10-CM | POA: Diagnosis present

## 2017-02-25 DIAGNOSIS — I129 Hypertensive chronic kidney disease with stage 1 through stage 4 chronic kidney disease, or unspecified chronic kidney disease: Secondary | ICD-10-CM | POA: Diagnosis present

## 2017-02-25 DIAGNOSIS — K219 Gastro-esophageal reflux disease without esophagitis: Secondary | ICD-10-CM | POA: Diagnosis present

## 2017-02-25 DIAGNOSIS — Z888 Allergy status to other drugs, medicaments and biological substances status: Secondary | ICD-10-CM | POA: Diagnosis not present

## 2017-02-25 DIAGNOSIS — E119 Type 2 diabetes mellitus without complications: Secondary | ICD-10-CM | POA: Diagnosis not present

## 2017-02-25 DIAGNOSIS — Z7951 Long term (current) use of inhaled steroids: Secondary | ICD-10-CM

## 2017-02-25 DIAGNOSIS — I2510 Atherosclerotic heart disease of native coronary artery without angina pectoris: Secondary | ICD-10-CM | POA: Diagnosis not present

## 2017-02-25 DIAGNOSIS — R079 Chest pain, unspecified: Secondary | ICD-10-CM | POA: Diagnosis not present

## 2017-02-25 DIAGNOSIS — R0600 Dyspnea, unspecified: Secondary | ICD-10-CM | POA: Diagnosis not present

## 2017-02-25 DIAGNOSIS — Z882 Allergy status to sulfonamides status: Secondary | ICD-10-CM

## 2017-02-25 DIAGNOSIS — B20 Human immunodeficiency virus [HIV] disease: Secondary | ICD-10-CM | POA: Diagnosis present

## 2017-02-25 DIAGNOSIS — I10 Essential (primary) hypertension: Secondary | ICD-10-CM | POA: Diagnosis not present

## 2017-02-25 DIAGNOSIS — Z886 Allergy status to analgesic agent status: Secondary | ICD-10-CM | POA: Diagnosis not present

## 2017-02-25 DIAGNOSIS — Z95828 Presence of other vascular implants and grafts: Secondary | ICD-10-CM

## 2017-02-25 DIAGNOSIS — M79661 Pain in right lower leg: Secondary | ICD-10-CM | POA: Diagnosis not present

## 2017-02-25 DIAGNOSIS — F329 Major depressive disorder, single episode, unspecified: Secondary | ICD-10-CM | POA: Diagnosis present

## 2017-02-25 DIAGNOSIS — J45909 Unspecified asthma, uncomplicated: Secondary | ICD-10-CM | POA: Diagnosis not present

## 2017-02-25 DIAGNOSIS — R011 Cardiac murmur, unspecified: Secondary | ICD-10-CM | POA: Diagnosis not present

## 2017-02-25 DIAGNOSIS — Z66 Do not resuscitate: Secondary | ICD-10-CM | POA: Diagnosis present

## 2017-02-25 DIAGNOSIS — Z79899 Other long term (current) drug therapy: Secondary | ICD-10-CM

## 2017-02-25 DIAGNOSIS — E785 Hyperlipidemia, unspecified: Secondary | ICD-10-CM | POA: Diagnosis present

## 2017-02-25 DIAGNOSIS — Z91030 Bee allergy status: Secondary | ICD-10-CM | POA: Diagnosis not present

## 2017-02-25 DIAGNOSIS — N189 Chronic kidney disease, unspecified: Secondary | ICD-10-CM | POA: Diagnosis present

## 2017-02-25 DIAGNOSIS — I998 Other disorder of circulatory system: Secondary | ICD-10-CM | POA: Diagnosis not present

## 2017-02-25 DIAGNOSIS — D649 Anemia, unspecified: Secondary | ICD-10-CM | POA: Diagnosis not present

## 2017-02-25 LAB — CBC
HEMATOCRIT: 40.3 % (ref 40.0–52.0)
Hemoglobin: 13.6 g/dL (ref 13.0–18.0)
MCH: 33 pg (ref 26.0–34.0)
MCHC: 33.8 g/dL (ref 32.0–36.0)
MCV: 97.7 fL (ref 80.0–100.0)
Platelets: 232 10*3/uL (ref 150–440)
RBC: 4.13 MIL/uL — ABNORMAL LOW (ref 4.40–5.90)
RDW: 14.2 % (ref 11.5–14.5)
WBC: 9 10*3/uL (ref 3.8–10.6)

## 2017-02-25 LAB — GLUCOSE, CAPILLARY: GLUCOSE-CAPILLARY: 123 mg/dL — AB (ref 65–99)

## 2017-02-25 LAB — BASIC METABOLIC PANEL
Anion gap: 5 (ref 5–15)
BUN: 12 mg/dL (ref 6–20)
CHLORIDE: 108 mmol/L (ref 101–111)
CO2: 26 mmol/L (ref 22–32)
CREATININE: 1.41 mg/dL — AB (ref 0.61–1.24)
Calcium: 8.3 mg/dL — ABNORMAL LOW (ref 8.9–10.3)
GFR calc Af Amer: >60 mL/min (ref >60)
GFR calc non Af Amer: 53 mL/min — ABNORMAL LOW (ref >60)
GLUCOSE: 197 mg/dL — AB (ref 65–99)
POTASSIUM: 3.5 mmol/L (ref 3.5–5.1)
Sodium: 139 mmol/L (ref 135–145)

## 2017-02-25 LAB — PROTIME-INR
INR: 3.04
Prothrombin Time: 32.1 seconds — ABNORMAL HIGH (ref 11.4–15.2)

## 2017-02-25 LAB — APTT: aPTT: 97 seconds — ABNORMAL HIGH (ref 24–36)

## 2017-02-25 LAB — HEPARIN LEVEL (UNFRACTIONATED): HEPARIN UNFRACTIONATED: 0.19 [IU]/mL — AB (ref 0.30–0.70)

## 2017-02-25 MED ORDER — MIRTAZAPINE 15 MG PO TABS
15.0000 mg | ORAL_TABLET | Freq: Every evening | ORAL | Status: DC
  Administered 2017-02-25 – 2017-02-26 (×2): 15 mg via ORAL
  Filled 2017-02-25 (×2): qty 1

## 2017-02-25 MED ORDER — MELOXICAM 7.5 MG PO TABS
7.5000 mg | ORAL_TABLET | Freq: Every day | ORAL | Status: DC | PRN
  Administered 2017-02-26: 7.5 mg via ORAL
  Filled 2017-02-25 (×2): qty 1

## 2017-02-25 MED ORDER — CALCIUM CARBONATE-VITAMIN D 600-400 MG-UNIT PO TABS: 1.0000 | ORAL_TABLET | Freq: Every day | ORAL | Status: DC

## 2017-02-25 MED ORDER — HYDROMORPHONE HCL 1 MG/ML IJ SOLN
1.0000 mg | Freq: Once | INTRAMUSCULAR | Status: AC
  Administered 2017-02-25: 1 mg via INTRAVENOUS
  Filled 2017-02-25: qty 1

## 2017-02-25 MED ORDER — ACETAMINOPHEN 325 MG PO TABS: 650.0000 mg | ORAL_TABLET | Freq: Four times a day (QID) | ORAL | Status: DC | PRN

## 2017-02-25 MED ORDER — IOPAMIDOL (ISOVUE-370) INJECTION 76%
75.0000 mL | Freq: Once | INTRAVENOUS | Status: AC | PRN
  Administered 2017-02-25: 75 mL via INTRAVENOUS

## 2017-02-25 MED ORDER — ALBUTEROL SULFATE (2.5 MG/3ML) 0.083% IN NEBU
2.5000 mg | INHALATION_SOLUTION | Freq: Three times a day (TID) | RESPIRATORY_TRACT | Status: DC
  Administered 2017-02-25 – 2017-02-27 (×5): 2.5 mg via RESPIRATORY_TRACT
  Filled 2017-02-25 (×4): qty 3

## 2017-02-25 MED ORDER — INSULIN ASPART 100 UNIT/ML ~~LOC~~ SOLN
0.0000 [IU] | Freq: Three times a day (TID) | SUBCUTANEOUS | Status: DC
  Administered 2017-02-26 (×2): 2 [IU] via SUBCUTANEOUS
  Filled 2017-02-25 (×2): qty 1

## 2017-02-25 MED ORDER — SENNOSIDES-DOCUSATE SODIUM 8.6-50 MG PO TABS: 1.0000 | ORAL_TABLET | Freq: Every evening | ORAL | Status: DC | PRN

## 2017-02-25 MED ORDER — ONDANSETRON HCL 4 MG PO TABS: 4.0000 mg | ORAL_TABLET | Freq: Four times a day (QID) | ORAL | Status: DC | PRN

## 2017-02-25 MED ORDER — OFLOXACIN 0.3 % OP SOLN
1.0000 [drp] | Freq: Four times a day (QID) | OPHTHALMIC | Status: DC
  Administered 2017-02-26 – 2017-02-27 (×5): 1 [drp] via OPHTHALMIC
  Filled 2017-02-25: qty 5

## 2017-02-25 MED ORDER — NITROGLYCERIN 0.4 MG SL SUBL: 0.4000 mg | SUBLINGUAL_TABLET | SUBLINGUAL | Status: DC | PRN

## 2017-02-25 MED ORDER — HEPARIN (PORCINE) IN NACL 100-0.45 UNIT/ML-% IJ SOLN
1000.0000 [IU]/h | Freq: Once | INTRAMUSCULAR | Status: AC
  Administered 2017-02-25: 1000 [IU]/h via INTRAVENOUS
  Filled 2017-02-25: qty 250

## 2017-02-25 MED ORDER — KETOROLAC TROMETHAMINE 0.5 % OP SOLN
1.0000 [drp] | Freq: Four times a day (QID) | OPHTHALMIC | Status: DC
  Administered 2017-02-26 – 2017-02-27 (×5): 1 [drp] via OPHTHALMIC
  Filled 2017-02-25: qty 3

## 2017-02-25 MED ORDER — CARVEDILOL 12.5 MG PO TABS
12.5000 mg | ORAL_TABLET | Freq: Two times a day (BID) | ORAL | Status: DC
  Administered 2017-02-26 – 2017-02-27 (×3): 12.5 mg via ORAL
  Filled 2017-02-25 (×3): qty 1

## 2017-02-25 MED ORDER — FUROSEMIDE 20 MG PO TABS
20.0000 mg | ORAL_TABLET | Freq: Every day | ORAL | Status: DC
  Administered 2017-02-26 – 2017-02-27 (×2): 20 mg via ORAL
  Filled 2017-02-25 (×2): qty 1

## 2017-02-25 MED ORDER — SODIUM CHLORIDE 0.9% FLUSH
3.0000 mL | Freq: Two times a day (BID) | INTRAVENOUS | Status: DC
  Administered 2017-02-26 – 2017-02-27 (×4): 3 mL via INTRAVENOUS

## 2017-02-25 MED ORDER — TRAMADOL HCL 50 MG PO TABS
50.0000 mg | ORAL_TABLET | Freq: Two times a day (BID) | ORAL | Status: DC
  Administered 2017-02-25 – 2017-02-27 (×4): 50 mg via ORAL
  Filled 2017-02-25 (×4): qty 1

## 2017-02-25 MED ORDER — TIOTROPIUM BROMIDE MONOHYDRATE 18 MCG IN CAPS
18.0000 ug | ORAL_CAPSULE | Freq: Every day | RESPIRATORY_TRACT | Status: DC
  Administered 2017-02-26 – 2017-02-27 (×2): 18 ug via RESPIRATORY_TRACT
  Filled 2017-02-25: qty 5

## 2017-02-25 MED ORDER — MAGNESIUM OXIDE 400 (241.3 MG) MG PO TABS
400.0000 mg | ORAL_TABLET | Freq: Every day | ORAL | Status: DC
Start: 2017-02-26 — End: 2017-02-27
  Administered 2017-02-26 – 2017-02-27 (×2): 400 mg via ORAL
  Filled 2017-02-25 (×2): qty 1

## 2017-02-25 MED ORDER — BISACODYL 5 MG PO TBEC: 5.0000 mg | DELAYED_RELEASE_TABLET | Freq: Every day | ORAL | Status: DC | PRN

## 2017-02-25 MED ORDER — PANTOPRAZOLE SODIUM 40 MG PO TBEC
40.0000 mg | DELAYED_RELEASE_TABLET | Freq: Every day | ORAL | Status: DC
  Administered 2017-02-26 – 2017-02-27 (×2): 40 mg via ORAL
  Filled 2017-02-25 (×2): qty 1

## 2017-02-25 MED ORDER — SODIUM CHLORIDE 0.9 % IV SOLN: 250.0000 mL | INTRAVENOUS | Status: DC | PRN

## 2017-02-25 MED ORDER — CALCIUM CARBONATE-VITAMIN D 500-200 MG-UNIT PO TABS
1.0000 | ORAL_TABLET | Freq: Every day | ORAL | Status: DC
  Administered 2017-02-26 – 2017-02-27 (×2): 1 via ORAL
  Filled 2017-02-25 (×2): qty 1

## 2017-02-25 MED ORDER — ABACAVIR-DOLUTEGRAVIR-LAMIVUD 600-50-300 MG PO TABS
1.0000 | ORAL_TABLET | Freq: Every day | ORAL | Status: DC
  Administered 2017-02-26 – 2017-02-27 (×2): 1 via ORAL
  Filled 2017-02-25 (×2): qty 1

## 2017-02-25 MED ORDER — MAGNESIUM CITRATE PO SOLN
1.0000 | Freq: Once | ORAL | Status: DC | PRN
  Filled 2017-02-25: qty 296

## 2017-02-25 MED ORDER — OXYCODONE-ACETAMINOPHEN 5-325 MG PO TABS: 2.0000 | ORAL_TABLET | Freq: Once | ORAL | Status: DC

## 2017-02-25 MED ORDER — POTASSIUM CHLORIDE CRYS ER 10 MEQ PO TBCR
10.0000 meq | EXTENDED_RELEASE_TABLET | Freq: Every day | ORAL | Status: DC
  Administered 2017-02-26 – 2017-02-27 (×2): 10 meq via ORAL
  Filled 2017-02-25 (×2): qty 1

## 2017-02-25 MED ORDER — INSULIN ASPART 100 UNIT/ML ~~LOC~~ SOLN: 0.0000 [IU] | Freq: Every day | SUBCUTANEOUS | Status: DC

## 2017-02-25 MED ORDER — PREDNISOLONE ACETATE 1 % OP SUSP
1.0000 [drp] | Freq: Two times a day (BID) | OPHTHALMIC | Status: DC
  Administered 2017-02-26 – 2017-02-27 (×4): 1 [drp] via OPHTHALMIC
  Filled 2017-02-25: qty 1

## 2017-02-25 MED ORDER — LINACLOTIDE 72 MCG PO CAPS
72.0000 ug | ORAL_CAPSULE | Freq: Every day | ORAL | Status: DC
  Administered 2017-02-26 – 2017-02-27 (×2): 72 ug via ORAL
  Filled 2017-02-25 (×2): qty 1

## 2017-02-25 MED ORDER — ESCITALOPRAM OXALATE 20 MG PO TABS
20.0000 mg | ORAL_TABLET | Freq: Every day | ORAL | Status: DC
  Administered 2017-02-26 – 2017-02-27 (×2): 20 mg via ORAL
  Filled 2017-02-25 (×2): qty 1

## 2017-02-25 MED ORDER — HYDROCODONE-ACETAMINOPHEN 5-325 MG PO TABS
1.0000 | ORAL_TABLET | ORAL | Status: DC | PRN
  Administered 2017-02-26 – 2017-02-27 (×3): 1 via ORAL
  Filled 2017-02-25 (×3): qty 1

## 2017-02-25 MED ORDER — HEPARIN BOLUS VIA INFUSION
4000.0000 [IU] | Freq: Once | INTRAVENOUS | Status: AC
  Administered 2017-02-25: 4000 [IU] via INTRAVENOUS
  Filled 2017-02-25: qty 4000

## 2017-02-25 MED ORDER — ACETAMINOPHEN 650 MG RE SUPP: 650.0000 mg | Freq: Four times a day (QID) | RECTAL | Status: DC | PRN

## 2017-02-25 MED ORDER — MOMETASONE FURO-FORMOTEROL FUM 200-5 MCG/ACT IN AERO
2.0000 | INHALATION_SPRAY | Freq: Three times a day (TID) | RESPIRATORY_TRACT | Status: DC
  Administered 2017-02-26 (×4): 2 via RESPIRATORY_TRACT
  Filled 2017-02-25: qty 8.8

## 2017-02-25 MED ORDER — ALBUTEROL SULFATE (2.5 MG/3ML) 0.083% IN NEBU
INHALATION_SOLUTION | RESPIRATORY_TRACT | Status: AC
  Filled 2017-02-25: qty 3

## 2017-02-25 MED ORDER — ATORVASTATIN CALCIUM 10 MG PO TABS
10.0000 mg | ORAL_TABLET | Freq: Every day | ORAL | Status: DC
  Administered 2017-02-26: 10 mg via ORAL
  Filled 2017-02-25: qty 1

## 2017-02-25 MED ORDER — ONDANSETRON HCL 4 MG/2ML IJ SOLN: 4.0000 mg | Freq: Four times a day (QID) | INTRAMUSCULAR | Status: DC | PRN

## 2017-02-25 MED ORDER — ALBUTEROL SULFATE (2.5 MG/3ML) 0.083% IN NEBU: 2.5000 mg | INHALATION_SOLUTION | Freq: Four times a day (QID) | RESPIRATORY_TRACT | Status: DC | PRN

## 2017-02-25 MED ORDER — ADULT MULTIVITAMIN W/MINERALS CH
1.0000 | ORAL_TABLET | Freq: Every day | ORAL | Status: DC
  Administered 2017-02-26 – 2017-02-27 (×2): 1 via ORAL
  Filled 2017-02-25 (×2): qty 1

## 2017-02-25 MED ORDER — LORATADINE 10 MG PO TABS
10.0000 mg | ORAL_TABLET | Freq: Every day | ORAL | Status: DC
  Administered 2017-02-26 – 2017-02-27 (×2): 10 mg via ORAL
  Filled 2017-02-25 (×2): qty 1

## 2017-02-25 MED ORDER — ZOLPIDEM TARTRATE 5 MG PO TABS
5.0000 mg | ORAL_TABLET | Freq: Every day | ORAL | Status: DC
  Administered 2017-02-25 – 2017-02-26 (×2): 5 mg via ORAL
  Filled 2017-02-25 (×2): qty 1

## 2017-02-25 MED ORDER — HEPARIN (PORCINE) IN NACL 100-0.45 UNIT/ML-% IJ SOLN
1500.0000 [IU]/h | INTRAMUSCULAR | Status: DC
  Administered 2017-02-26 (×2): 1500 [IU]/h via INTRAVENOUS
  Filled 2017-02-25: qty 250

## 2017-02-25 MED ORDER — SODIUM CHLORIDE 0.9% FLUSH: 3.0000 mL | INTRAVENOUS | Status: DC | PRN

## 2017-02-25 NOTE — Progress Notes (Signed)
ANTICOAGULATION CONSULT NOTE - Initial Consult  Pharmacy Consult for heparin drip Indication: DVT  Allergies  Allergen Reactions  . Aspirin Anaphylaxis  . Bee Venom Anaphylaxis  . Penicillins Anaphylaxis and Other (See Comments)    Has patient had a PCN reaction causing immediate rash, facial/tongue/throat swelling, SOB or lightheadedness with hypotension: Yes Has patient had a PCN reaction causing severe rash involving mucus membranes or skin necrosis: No Has patient had a PCN reaction that required hospitalization No Has patient had a PCN reaction occurring within the last 10 years: Yes If all of the above answers are "NO", then may proceed with Cephalosporin use.  . Prednisone Anaphylaxis  . Sulfa Antibiotics Anaphylaxis  . Sulfasalazine Anaphylaxis  . Theophyllines Anaphylaxis  . Theophylline Swelling    Patient Measurements: Height: 5\' 5"  (165.1 cm) Weight: 185 lb (83.9 kg) IBW/kg (Calculated) : 61.5 Heparin Dosing Weight: 83.9 kg  Vital Signs: Temp: 98.1 F (36.7 C) (07/20 2231) Temp Source: Oral (07/20 2231) BP: 168/101 (07/20 2231) Pulse Rate: 82 (07/20 2231)  Labs:  Recent Labs  02/25/17 1603  HGB 13.6  HCT 40.3  PLT 232  LABPROT 32.1*  INR 3.04  CREATININE 1.41*    Estimated Creatinine Clearance: 56.3 mL/min (A) (by C-G formula based on SCr of 1.41 mg/dL (H)).   Medical History: Past Medical History:  Diagnosis Date  . Anemia   . Asthma   . Cataracts, bilateral    worse in Rt eye  . Collagen vascular disease (HCC)   . COPD (chronic obstructive pulmonary disease) (HCC)   . Coronary artery disease   . Depression   . DVT (deep venous thrombosis) (HCC)   . Dysrhythmia   . Emphysema/COPD (HCC)   . Environmental and seasonal allergies   . H/O blood clots   . Heart murmur   . HIV (human immunodeficiency virus infection) (HCC)   . Hypertension   . Leaky heart valve    x 3  . Lung mass   . Myocardial infarction (HCC)    in 2000  . Pneumonia     year ago  . Pulmonary emboli (HCC)   . Type 2 diabetes mellitus (HCC)     Medications:  Scheduled:  . [START ON 02/26/2017] abacavir-dolutegravir-lamiVUDine  1 tablet Oral QPC breakfast  . albuterol  2.5 mg Nebulization TID  . [START ON 02/26/2017] atorvastatin  10 mg Oral QPC breakfast  . [START ON 02/26/2017] Calcium Carbonate-Vitamin D  1 tablet Oral QPC breakfast  . [START ON 02/26/2017] carvedilol  12.5 mg Oral BID WC  . [START ON 02/26/2017] escitalopram  20 mg Oral QPC breakfast  . [START ON 02/26/2017] furosemide  20 mg Oral Daily  . heparin  4,000 Units Intravenous Once  . [START ON 02/26/2017] insulin aspart  0-15 Units Subcutaneous TID WC  . insulin aspart  0-5 Units Subcutaneous QHS  . ketorolac  1 drop Left Eye QID  . [START ON 02/26/2017] linaclotide  72 mcg Oral QPC breakfast  . [START ON 02/26/2017] loratadine  10 mg Oral Daily  . [START ON 02/26/2017] magnesium oxide  400 mg Oral QPC breakfast  . mirtazapine  15 mg Oral QPM  . mometasone-formoterol  2 puff Inhalation TID  . [START ON 02/26/2017] multivitamin with minerals  1 tablet Oral Daily  . ofloxacin  1 drop Left Eye QID  . [START ON 02/26/2017] pantoprazole  40 mg Oral Daily  . [START ON 02/26/2017] potassium chloride  10 mEq Oral Daily  . prednisoLONE  acetate  1 drop Right Eye BID  . sodium chloride flush  3 mL Intravenous Q12H  . [START ON 02/26/2017] tiotropium  18 mcg Inhalation QPC breakfast  . traMADol  50 mg Oral BID  . zolpidem  5 mg Oral QHS    Assessment: Patient admitted for R leg pain from hip to knee x 7 days. Patient has a h/o of DVTs and has an IVC filter placed and is anticoagulated w/ warfarin at home (2 mg on Friday, Saturday -- then 4 mg all other days). 7/20 1603: Patient's current INR 3.04 (therapeutic) -- patient may have failed warfarin therapy. Is being started on heparin drip.  Goal of Therapy:  Heparin level 0.3-0.7 units/ml Monitor platelets by anticoagulation protocol: Yes   Plan:   Given the nature of the patient's history of DVT and placement of IVC filter and what appears to be failure of warfarin therapy will bolus w/ heparin 4000 units IV x 1. Will start the heparin drip @ 1300 units/hr (16 units/kg). Baseline labs ordered (INR therapeutic @ 3.04). Will check HL @ 0500.  Thomasene Rippleavid Shareen Capwell, PharmD, BCPS Clinical Pharmacist 02/25/2017

## 2017-02-25 NOTE — ED Notes (Signed)
Patient transported to CT 

## 2017-02-25 NOTE — ED Triage Notes (Signed)
Pt reports right leg pain from his knee to his hip for over one week. Pt reports pain has gradually increased. Pt reports taking warfarin and history of DVT with filter placed. Strong right pedal pulses noted in triage.

## 2017-02-25 NOTE — H&P (Signed)
History and Physical   [facility] [place] @ [facility] Admission History and Physical [employer], D.O.    Patient Name: Brian Hampton MR#: [id number] Date of Birth: May 14, 1956 Date of Admission: 02/25/2017  Referring MD/NP/PA: Dr. Mayford Knife Primary Care Physician: Tonye Royalty, DO Patient coming from: Home  Chief Complaint:  Chief Complaint  Patient presents with  . Leg Pain    HPI: Brian Hampton is a 61 y.o. male with a known history of Anemia, asthma, COPD, coronary artery disease, depression, DVT on Coumadin, hypertension, diabetes presents to the emergency department for evaluation of leg pain.  Patient was in a usual state of health until one week ago and describes the onset of left hip pain radiating to his knee which is gradually worsening.  He also complains of chest pain, dyspnea on exertion. 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  Patient denies fevers/chills, weakness, dizziness, N/V/C/D, abdominal pain, dysuria/frequency, changes in mental status.    Otherwise there has been no change in status. Patient has been taking medication as prescribed and there has been no recent change in medication or diet.  No recent antibiotics.  There has been no recent illness, hospitalizations, travel or sick contacts.    EMS/ED Course: Patient received heparin, Dilaudid, percocet. Medical illness was requested for ongoing management of DVT in the right lower extremity despite Coumadin.   Review of Systems:  CONSTITUTIONAL: No fever/chills, fatigue, weakness, weight gain/loss, headache. EYES: No blurry or double vision. ENT: No tinnitus, postnasal drip, redness or soreness of the oropharynx. RESPIRATORY: Positive dyspnea on exertion. Negative No cough, wheeze.  No hemoptysis.  CARDIOVASCULAR: Positive chest pain, negative palpitations, syncope, orthopnea. No lower extremity edema.  GASTROINTESTINAL: No nausea, vomiting, abdominal pain,  diarrhea, constipation.  No hematemesis, melena or hematochezia. GENITOURINARY: No dysuria, frequency, hematuria. ENDOCRINE: No polyuria or nocturia. No heat or cold intolerance. HEMATOLOGY: No anemia, bruising, bleeding. INTEGUMENTARY: No rashes, ulcers, lesions. MUSCULOSKELETAL: Positive leg pain. History of present illness No arthritis, gout, dyspnea. NEUROLOGIC: No numbness, tingling, ataxia, seizure-type activity, weakness. PSYCHIATRIC: No anxiety, depression, insomnia.   Past Medical History:  Diagnosis Date  . Anemia   . Asthma   . Cataracts, bilateral    worse in Rt eye  . Collagen vascular disease (HCC)   . COPD (chronic obstructive pulmonary disease) (HCC)   . Coronary artery disease   . Depression   . DVT (deep venous thrombosis) (HCC)   . Dysrhythmia   . Emphysema/COPD (HCC)   . Environmental and seasonal allergies   . H/O blood clots   . Heart murmur   . HIV (human immunodeficiency virus infection) (HCC)   . Hypertension   . Leaky heart valve    x 3  . Lung mass   . Myocardial infarction (HCC)    in 2000  . Pneumonia    year ago  . Pulmonary emboli (HCC)   . Type 2 diabetes mellitus (HCC)     Past Surgical History:  Procedure Laterality Date  . ANKLE ARTHROSCOPY    . IVC FILTER INSERTION    . KYPHOPLASTY N/A 12/21/2016   Procedure: KYPHOPLASTY;  Surgeon: Kennedy Bucker, MD;  Location: [facility] ORS;  Service: Orthopedics;  Laterality: N/A;  . PERIPHERAL VASCULAR CATHETERIZATION N/A 03/16/2016   Procedure: IVC Filter Insertion;  Surgeon: Renford Dills, MD;  Location: [facility] INVASIVE CV LAB;  Service: Cardiovascular;  Laterality: N/A;  . SINUS EXPLORATION    .  WRIST ARTHROSCOPY Right      reports that he quit smoking about 15 years ago. He has never used smokeless tobacco. He reports that he does not drink alcohol or use drugs.  Allergies  Allergen Reactions  . Aspirin Anaphylaxis  . Bee Venom Anaphylaxis  . Penicillins Anaphylaxis and Other (See Comments)     Has patient had a PCN reaction causing immediate rash, facial/tongue/throat swelling, SOB or lightheadedness with hypotension: Yes Has patient had a PCN reaction causing severe rash involving mucus membranes or skin necrosis: No Has patient had a PCN reaction that required hospitalization No Has patient had a PCN reaction occurring within the last 10 years: Yes If all of the above answers are "NO", then may proceed with Cephalosporin use.  . Prednisone Anaphylaxis  . Sulfa Antibiotics Anaphylaxis  . Sulfasalazine Anaphylaxis  . Theophyllines Anaphylaxis  . Theophylline Swelling    Family History  Problem Relation Age of Onset  . CAD Unknown     Prior to Admission medications   Medication Sig Start Date End Date Taking? Authorizing Provider  abacavir-dolutegravir-lamiVUDine (TRIUMEQ) 600-50-300 MG tablet Take 1 tablet by mouth daily after breakfast. Reported on 01/30/2016 01/27/16  Yes [provider]  albuterol (PROVENTIL HFA;VENTOLIN HFA) 108 (90 BASE) MCG/ACT inhaler Inhale 2 puffs into the lungs every 6 (six) hours as needed for wheezing or shortness of breath.   Yes [provider]  albuterol (PROVENTIL) (2.5 MG/3ML) 0.083% nebulizer solution Take 2.5 mg by nebulization 3 (three) times daily.    Yes [provider]  atorvastatin (LIPITOR) 10 MG tablet Take 10 mg by mouth daily after breakfast. 11/16/16  Yes [provider]  Calcium Carbonate-Vitamin D (CALCIUM 600+D) 600-400 MG-UNIT tablet Take 1 tablet by mouth daily after breakfast.    Yes [provider]  carvedilol (COREG) 12.5 MG tablet Take 12.5 mg by mouth 2 (two) times daily with a meal.   Yes [provider]  cetirizine (ZYRTEC) 10 MG tablet Take 10 mg by mouth daily as needed for allergies.    Yes [provider]  EPINEPHrine (EPIPEN 2-PAK) 0.3 mg/0.3 mL IJ SOAJ injection Inject 0.3 mg into the muscle once as needed (for severe allergic reaction).   Yes [provider]  escitalopram (LEXAPRO) 20 MG tablet Take 20 mg by mouth daily after breakfast.    Yes [provider]  furosemide (LASIX) 20 MG tablet Take 20 mg by mouth daily.  10/22/16  Yes [provider]  HYDROcodone-acetaminophen (NORCO) 5-325 MG tablet Take 1 tablet by mouth every 4 (four) hours as needed for moderate pain. 12/24/16  Yes Dedra Skeens, PA-C  ketorolac (ACULAR) 0.5 % ophthalmic solution Place 1 drop into the left eye 4 (four) times daily. 02/03/17  Yes [provider]  LINZESS 72 MCG capsule Take 72 mcg by mouth daily after breakfast. 11/10/16  Yes [provider]  magnesium oxide (MAG-OX) 400 MG tablet Take 400 mg by mouth daily after breakfast.   Yes [provider]  meloxicam (MOBIC) 7.5 MG tablet Take 7.5 mg by mouth daily as needed for pain.    Yes [provider]  mirtazapine (REMERON) 15 MG tablet Take 15 mg by mouth every evening.    Yes [provider]  mometasone-formoterol (DULERA) 200-5 MCG/ACT AERO Inhale 2 puffs into the lungs 3 (three) times daily.    Yes [provider]  Multiple Vitamin (MULTIVITAMIN WITH MINERALS) TABS tablet Take 1 tablet by mouth daily. ONE-A-DAY MULTIVITAMIN 50+  Yes [provider]  nitroGLYCERIN (NITROSTAT) 0.4 MG SL tablet Place 0.4 mg under the tongue every 5 (five) minutes as needed for chest pain.   Yes [provider]  ofloxacin (OCUFLOX) 0.3 % ophthalmic solution Place 1 drop into the left eye 4 (four) times daily. 01/31/17  Yes [provider]  omeprazole (PRILOSEC) 40 MG capsule Take 40 mg by mouth daily after breakfast. 11/16/16  Yes [provider]  Potassium Chloride CR (MICRO-K) 8 MEQ CPCR capsule CR Take 1 capsule by mouth daily after breakfast. 11/15/16  Yes [provider]  prednisoLONE acetate (PRED FORTE) 1 % ophthalmic suspension Place 1 drop into the right eye 2 (two) times daily. 01/31/17 03/02/17 Yes [provider]  tiotropium (SPIRIVA) 18 MCG inhalation capsule Place 18 mcg into inhaler and inhale daily after breakfast.    Yes [provider]  traMADol (ULTRAM) 50 MG tablet Take 1 tablet (50 mg total) by mouth every 6 (six) hours as needed. Patient taking differently: Take 50 mg by mouth 2 (two) times daily.  03/17/16  Yes Katharina CaperVaickute, Rima, MD  warfarin (COUMADIN) 4 MG tablet Take 4 mg by mouth daily. Take 1/2 tablet (2 MG) on Friday, Saturday, & Take 1 tablet (4 MG) on Sunday,  Monday, Tuesday, Wednesday, & Thursday   Yes [provider]  zolpidem (AMBIEN) 5 MG tablet Take 5 mg by mouth at bedtime. 10/15/16  Yes [provider]  enoxaparin (LOVENOX) 150 MG/ML injection Inject 0.44 mLs (65 mg total) into the skin every 12 (twelve) hours. Patient not taking: Reported on 02/25/2017 03/17/16   Katharina CaperVaickute, Rima, MD  levalbuterol [facility](XOPENEX HFA) 45 MCG/ACT inhaler Inhale 1 puff into the lungs 3 (three) times daily. Patient not taking: Reported on 12/15/2016 05/12/16   Katha HammingKonidena, Snehalatha, MD  senna-docusate (SENOKOT-S) 8.6-50 MG tablet Take 1 tablet by mouth at bedtime as needed for mild constipation. Patient not taking: Reported on 11/26/2016 01/31/16   Katharina CaperVaickute, Rima, MD    Physical Exam: Vitals:   02/25/17 1600 02/25/17 1601  BP: (!) 187/95   Pulse: 91   Resp: 18   Temp: 98.6 F (37 C)   TempSrc: Oral   SpO2: 94%   Weight:  83.9 kg (185 lb)  Height:  5\' 5"  (1.651 m)    GENERAL: 61 y.o.-year-old Male patient, chronically ill appearing male lying in the bed in no acute distress.  Pleasant and cooperative.   HEENT: Head atraumatic, normocephalic. Pupils equal, round, reactive to light and accommodation. No scleral icterus. Extraocular muscles intact. Nares are patent. Oropharynx is clear. Mucus membranes moist. NECK: Supple, full range of motion. No JVD, no bruit heard. No thyroid enlargement, no tenderness, no cervical lymphadenopathy. CHEST: Normal breath sounds  bilaterally. No wheezing, rales, rhonchi or crackles. No use of accessory muscles of respiration.  No reproducible chest wall tenderness.  CARDIOVASCULAR: S1, S2 normal. No murmurs, rubs, or gallops. Cap refill <2 seconds. Pulses intact distally.  ABDOMEN: Soft, nondistended, nontender. No rebound, guarding, rigidity. Normoactive bowel sounds present in all four quadrants. No organomegaly or mass. EXTREMITIES: Mild right lower extremity edema nonpitting. Positive calf tenderness.  NEUROLOGIC: The patient is alert and oriented x 3. Cranial nerves II through XII are grossly intact with no focal sensorimotor deficit. Muscle strength 5/5 in all extremities. Sensation intact. Gait not checked. PSYCHIATRIC:  Normal affect, mood, thought content. SKIN: Warm, dry, and intact without obvious rash, lesion, or ulcer.    Labs on Admission:  CBC:  Recent  Labs Lab 02/25/17 1603  WBC 9.0  HGB 13.6  HCT 40.3  MCV 97.7  PLT 232   Basic Metabolic Panel:  Recent Labs Lab 02/25/17 1603  NA 139  K 3.5  CL 108  CO2 26  GLUCOSE 197*  BUN 12  CREATININE 1.41*  CALCIUM 8.3*   GFR: Estimated Creatinine Clearance: 56.3 mL/min (A) (by C-G formula based on SCr of 1.41 mg/dL (H)). Liver Function Tests: No results for input(s): AST, ALT, ALKPHOS, BILITOT, PROT, ALBUMIN in the last 168 hours. No results for input(s): LIPASE, AMYLASE in the last 168 hours. No results for input(s): AMMONIA in the last 168 hours. Coagulation Profile:  Recent Labs Lab 02/25/17 1603  INR 3.04   Cardiac Enzymes: No results for input(s): CKTOTAL, CKMB, CKMBINDEX, TROPONINI in the last 168 hours. BNP (last 3 results) No results for input(s): PROBNP in the last 8760 hours. HbA1C: No results for input(s): HGBA1C in the last 72 hours. CBG: No results for input(s): GLUCAP in the last 168 hours. Lipid Profile: No results for input(s): CHOL, HDL, LDLCALC, TRIG, CHOLHDL, LDLDIRECT in the last 72 hours. Thyroid Function  Tests: No results for input(s): TSH, T4TOTAL, FREET4, T3FREE, THYROIDAB in the last 72 hours. Anemia Panel: No results for input(s): VITAMINB12, FOLATE, FERRITIN, TIBC, IRON, RETICCTPCT in the last 72 hours. Urine analysis:    Component Value Date/Time   COLORURINE YELLOW (A) 05/05/2016 1631   APPEARANCEUR CLEAR (A) 05/05/2016 1631   LABSPEC 1.012 05/05/2016 1631   PHURINE 5.0 05/05/2016 1631   GLUCOSEU NEGATIVE 05/05/2016 1631   HGBUR 2+ (A) 05/05/2016 1631   BILIRUBINUR NEGATIVE 05/05/2016 1631   KETONESUR NEGATIVE 05/05/2016 1631   PROTEINUR 30 (A) 05/05/2016 1631   NITRITE NEGATIVE 05/05/2016 1631   LEUKOCYTESUR NEGATIVE 05/05/2016 1631   Sepsis Labs: @LABRCNTIP (procalcitonin:4,lacticidven:4) )No results found for this or any previous visit (from the past 240 hour(s)).   Radiological Exams on Admission: Ct Angio Chest Pe W And/or Wo Contrast  Result Date: 02/25/2017 CLINICAL DATA:  Right leg DVT.  Chest pain and dyspnea x1 week. EXAM: CT ANGIOGRAPHY CHEST WITH CONTRAST TECHNIQUE: Multidetector CT imaging of the chest was performed using the standard protocol during bolus administration of intravenous contrast. Multiplanar CT image reconstructions and MIPs were obtained to evaluate the vascular anatomy. CONTRAST:  75 cc Isovue 370 IV COMPARISON:  11/26/2016 CT FINDINGS: Cardiovascular: Satisfactory opacification of the pulmonary arteries to the subsegmental level. No evidence of pulmonary embolism. Normal heart size. Coronary arterial calcifications along the LAD. No pericardial effusion. No aortic aneurysm or dissection. Two vessel takeoff the aortic arch with common origin of the right brachiocephalic and left common carotid arteries. Mediastinum/Nodes: No enlarged mediastinal, hilar, or axillary lymph nodes. Thyroid gland, trachea, and esophagus demonstrate no significant findings. Lungs/Pleura: Lungs demonstrate dependent atelectasis bilaterally. Tiny subpleural bleb in the right  upper lobe with a few scattered bowel tiny nonspecific subpleural densities possibly representing chronic postinfectious or postinflammatory change or atelectasis. No pleural effusion or pneumothorax. Upper Abdomen: No acute abnormality.  Tip of an IVC filter is seen. Musculoskeletal: No chest wall abnormality. No acute or significant osseous findings. Old left-sided healed rib fractures. Old T6 fracture with augmentation and chronic moderate T8 compression fractures. T12-L1 degenerative disc disease with vacuum disc. Review of the MIP images confirms the above findings. IMPRESSION: 1. No acute pulmonary embolus or aortic aneurysm.  No dissection. 2. Coronary arteriosclerosis. 3. No active cardiopulmonary disease. Electronically Signed   By: Rene Kocher.D.  On: 02/25/2017 20:57   US Venous Img Lower Unilateral Right  Result Date: 02/25/2017 CLINICAL DATA:  Initial evaluation for acute leg pain. EXAM: Right LOWER EXTREMITY VENOUS DOPPLER ULTRASOUND TECHNIQUE: Gray-scale sonography with graded compression, as well as color Doppler and duplex ultrasound were performed to evaluate the lower extremity deep venous systems from the level of the common femoral vein and including the common femoral, femoral, profunda femoral, popliteal and calf veins including the posterior tibial, peroneal and gastrocnemius veins when visible. The superficial great saphenous vein was also interrogated. Spectral Doppler was utilized to evaluate flow at rest and with distal augmentation maneuvers in the common femoral, femoral and popliteal veins. COMPARISON:  Prior ultrasound from 01/13/2006. FINDINGS: Contralateral Common Femoral Vein: Respiratory phasicity is normal and symmetric with the symptomatic side. No evidence of thrombus. Normal compressibility. Common Femoral Vein: No evidence of thrombus. Normal compressibility, respiratory phasicity and response to augmentation. Saphenofemoral Junction: No evidence of thrombus. Normal  compressibility and flow on color Doppler imaging. Profunda Femoral Vein: No evidence of thrombus. Normal compressibility and flow on color Doppler imaging. Femoral Vein: The femoral vein/SFV is duplicated. Occlusive thrombus within 1 set of the right femoral vein beginning at the proximal mid thigh. Loss of normal compressibility. Popliteal Vein: Occlusive thrombus present throughout the popliteal vein with loss of normal compressibility. Calf Veins: Occlusive thrombus within the right posterior tibial and peroneal veins with loss of normal compressibility. Superficial Great Saphenous Vein: No evidence of thrombus. Normal compressibility and flow on color Doppler imaging. IMPRESSION: Positive study for DVT with occlusive thrombus involving the right femoral and popliteal veins as well as the veins of the calf, extending from the proximal- mid right thigh through to the ankle. Critical Value/emergent results were called by telephone at the time of interpretation on 02/25/2017 at 6:14 pm to Dr. Ileana Roup , who verbally acknowledged these results. Electronically Signed   By: Rise Mu M.D.   On: 02/25/2017 18:18    Assessment/Plan  This is a 61 y.o. male with a history of Anemia, asthma, COPD, coronary artery disease, depression, DVT on Coumadin, hypertension, HIV, diabetes now being admitted with:  #. Acute right lower extremity DVT despite Coumadin, therapeutic INR -Admit inpatient for heparin drip -Vascular consult -Pain control -Nothing by mouth after midnight for consideration of surgical procedure  #.  History of chronic kidney disease,  Stable -Monitor BMP  #.  History of depression -Continue Lexapro  #. History of  hyperlipidemia - Continue  Lipitor  #. History of  hypertension - Continue  Lasix, Coreg  #. History of  chronic pain - Continue  Norco, tramadol  #. History of  IBS - Continue Linzess  #. History of  GERD - Continue  Protonix for Prilosec  #. History  of COPD - Continue Dulera, Spiriva - O2 and mednebs as needed  Admission status:  Inpatient IV Fluids:  Hep-Lock Diet/Nutrition:  Heart healthy, carb controlled Consults called:  Vascular surgery  DVT Px: heparin drip early ambulation. Code Status: DNR Disposition Plan: To home in  1-2 days  All the records are reviewed and case discussed with ED provider. Management plans discussed with the patient and/or family who express understanding and agree with plan of care.  Bevan Disney D.O. on 02/25/2017 at 9:11 PM Between 7am to 6pm - Pager - [phone] After 6pm go to [email or web address] - Administrator, [employer] [facility] [place] [facility] Office [phone]  02/25/2017, 9:11 PM

## 2017-02-25 NOTE — ED Provider Notes (Signed)
[facility] Emergency Department Provider Note       Time seen: ----------------------------------------- 6:43 PM on 02/25/2017 -----------------------------------------     I have reviewed the triage vital signs and the nursing notes.   HISTORY   Chief Complaint Leg Pain    HPI Brian Hampton is a 61 y.o. male who presents to the ED for right leg pain for the past week. Patient reports pain from his hip to his knee for the past 7 days. Patient reports pains gradually increase, he is taking warfarin with a history of DVT and IVC filter placement. Patient reports he had a blood clot present when he was last ultrasounded in December. He does have chest pain difficulty breathing as well and states his doctor told him his filter may not be working.   Past Medical History:  Diagnosis Date  . Anemia   . Asthma   . Cataracts, bilateral    worse in Rt eye  . Collagen vascular disease (HCC)   . COPD (chronic obstructive pulmonary disease) (HCC)   . Coronary artery disease   . Depression   . DVT (deep venous thrombosis) (HCC)   . Dysrhythmia   . Emphysema/COPD (HCC)   . Environmental and seasonal allergies   . H/O blood clots   . Heart murmur   . HIV (human immunodeficiency virus infection) (HCC)   . Hypertension   . Leaky heart valve    x 3  . Lung mass   . Myocardial infarction (HCC)    in 2000  . Pneumonia    year ago  . Pulmonary emboli (HCC)   . Type 2 diabetes mellitus [facility])     Patient Active Problem List   Diagnosis Date Noted  . Postoperative pain   . COPD exacerbation (HCC) 12/23/2016  . Abdominal pain   . Compression fracture of body of thoracic vertebra (HCC) 12/21/2016  . Hypomagnesemia 05/05/2016  . Rhabdomyolysis 05/05/2016  . Acute pulmonary embolism (HCC) 03/17/2016  . Right leg DVT (HCC) 03/17/2016  . Hypokalemia 03/17/2016  . S/P IVC filter 03/17/2016  . SOB (shortness of breath) 03/15/2016  . Dysphagia 02/02/2016  .  GERD (gastroesophageal reflux disease) 02/02/2016  . Hyperthyroidism 01/31/2016  . 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 Elevated transaminase level 01/31/2016  . Anemia 01/31/2016  . Thrombocytopenia (HCC) 01/31/2016  . Pyuria 01/31/2016  . Weakness 01/29/2016  . HIV (human immunodeficiency virus infection) (HCC) 01/29/2016  . CAD (coronary artery disease) 01/29/2016  . COPD (chronic obstructive pulmonary disease) (HCC) 01/29/2016  . Diabetes mellitus (HCC) 01/29/2016  . Leg weakness, bilateral 01/13/2016  . Chest pain 06/24/2015    Past Surgical History:  Procedure Laterality Date  . ANKLE ARTHROSCOPY    . IVC FILTER INSERTION    . KYPHOPLASTY N/A 12/21/2016   Procedure: KYPHOPLASTY;  Surgeon: Kennedy Bucker, MD;  Location: [facility] ORS;  Service: Orthopedics;  Laterality: N/A;  . PERIPHERAL VASCULAR CATHETERIZATION N/A 03/16/2016   Procedure: IVC Filter Insertion;  Surgeon: Renford Dills, MD;  Location: [facility] INVASIVE CV LAB;  Service: Cardiovascular;  Laterality: N/A;  . SINUS EXPLORATION    . WRIST ARTHROSCOPY Right     Allergies Aspirin; Bee venom; Penicillins; Prednisone; Sulfa antibiotics; Sulfasalazine; Theophyllines; and Theophylline  Social History Social History  Substance Use Topics  . Smoking status: Former Smoker    Quit date: 12/16/2001  . Smokeless tobacco: Never Used  . Alcohol use No    Review of Systems Constitutional: Negative for fever. Eyes: Negative  for vision changes ENT:  Negative for congestion, sore throat Cardiovascular: Positive for chest pain Respiratory: Positive for shortness of breath Gastrointestinal: Negative for abdominal pain, vomiting and diarrhea. Genitourinary: Negative for dysuria. Musculoskeletal: Positive for right leg pain Skin: Negative for rash. Neurological: Negative for headaches, focal weakness or numbness.  All systems negative/normal/unremarkable except as stated in the  HPI  ____________________________________________   PHYSICAL EXAM:  VITAL SIGNS: ED Triage Vitals  Enc Vitals Group     BP 02/25/17 1600 (!) 187/95     Pulse Rate 02/25/17 1600 91     Resp 02/25/17 1600 18     Temp 02/25/17 1600 98.6 F (37 C)     Temp Source 02/25/17 1600 Oral     SpO2 02/25/17 1600 94 %     Weight 02/25/17 1601 185 lb (83.9 kg)     Height 02/25/17 1601 5\' 5"  (1.651 m)     Head Circumference --      Peak Flow --      Pain Score 02/25/17 1600 9     Pain Loc --      Pain Edu? --      Excl. in GC? --     Constitutional: Alert and oriented. Well appearing and in no distress. Eyes: Conjunctivae are normal. Normal extraocular movements. ENT   Head: Normocephalic and atraumatic.   Nose: No congestion/rhinnorhea.   Mouth/Throat: Mucous membranes are moist.   Neck: No stridor. Cardiovascular: Normal rate, regular rhythm. No murmurs, rubs, or gallops. Excellent pulses in both feet Respiratory: Normal respiratory effort without tachypnea nor retractions. Breath sounds are clear and equal bilaterally. No wheezes/rales/rhonchi. Gastrointestinal: Soft and nontender. Normal bowel sounds Musculoskeletal: Nontender with normal range of motion in extremities. Mild edema Neurologic:  Normal speech and language. No gross focal neurologic deficits are appreciated.  Skin:  Skin is warm, dry and intact. No rash noted. Psychiatric: Mood and affect are normal. Speech and behavior are normal.  ___________________________________________  ED COURSE:  Pertinent labs & imaging results that were available during my care of the patient were reviewed by me and considered in my medical decision making (see chart for details). Patient presents for leg pain with a history of DVT, we will assess with labs and imaging as indicated.   Procedures ____________________________________________   LABS (pertinent positives/negatives)  Labs Reviewed  CBC - Abnormal; Notable for  the following:       Result Value   RBC 4.13 (*)    All other components within normal limits  BASIC METABOLIC PANEL - Abnormal; Notable for the following:    Glucose, Bld 197 (*)    Creatinine, Ser 1.41 (*)    Calcium 8.3 (*)    GFR calc non Af Amer 53 (*)    All other components within normal limits  PROTIME-INR - Abnormal; Notable for the following:    Prothrombin Time 32.1 (*)    All other components within normal limits    RADIOLOGY Images were viewed by me  IMPRESSION: Positive study for DVT with occlusive thrombus involving the right femoral and popliteal veins as well as the veins of the calf, extending from the proximal- mid right thigh through to the ankle.  Critical Value/emergent results were called by telephone at the time of interpretation on 02/25/2017 at 6:14 pm to Dr. Ileana RoupJAMES MCSHANE , who verbally acknowledged these results. CT angiogram of the chest  ____________________________________________  FINAL ASSESSMENT AND PLAN  DVT  Plan: Patient's labs and imaging were dictated above.  Patient had presented for right leg pain which is possibly from DVT. Patient reports that 6 months ago had an ultrasound which was negative for DVT. Currently today he has occlusive thrombus despite being on Coumadin. Patient is adamant that he does not want to go home and he is concerned he may die. I will order a CT angiogram of the chest to rule out PE. Have discussed with [facility] surgery, will place him on heparin drip and consult Vascular surgery in the morning.   Emily Filbert, MD   Note: This note was generated in part or whole with voice recognition software. Voice recognition is usually quite accurate but there are transcription errors that can and very often do occur. I apologize for any typographical errors that were not detected and corrected.     Emily Filbert, MD 02/25/17 [phone]

## 2017-02-25 NOTE — ED Notes (Signed)
Admitting Provider at bedside. 

## 2017-02-26 DIAGNOSIS — Z79899 Other long term (current) drug therapy: Secondary | ICD-10-CM

## 2017-02-26 DIAGNOSIS — D649 Anemia, unspecified: Secondary | ICD-10-CM

## 2017-02-26 DIAGNOSIS — Z87891 Personal history of nicotine dependence: Secondary | ICD-10-CM

## 2017-02-26 DIAGNOSIS — I10 Essential (primary) hypertension: Secondary | ICD-10-CM

## 2017-02-26 DIAGNOSIS — J449 Chronic obstructive pulmonary disease, unspecified: Secondary | ICD-10-CM

## 2017-02-26 DIAGNOSIS — B20 Human immunodeficiency virus [HIV] disease: Secondary | ICD-10-CM

## 2017-02-26 DIAGNOSIS — I82411 Acute embolism and thrombosis of right femoral vein: Secondary | ICD-10-CM

## 2017-02-26 DIAGNOSIS — J45909 Unspecified asthma, uncomplicated: Secondary | ICD-10-CM

## 2017-02-26 DIAGNOSIS — Z86711 Personal history of pulmonary embolism: Secondary | ICD-10-CM

## 2017-02-26 DIAGNOSIS — I252 Old myocardial infarction: Secondary | ICD-10-CM

## 2017-02-26 DIAGNOSIS — Z7901 Long term (current) use of anticoagulants: Secondary | ICD-10-CM

## 2017-02-26 DIAGNOSIS — I38 Endocarditis, valve unspecified: Secondary | ICD-10-CM

## 2017-02-26 DIAGNOSIS — I998 Other disorder of circulatory system: Secondary | ICD-10-CM

## 2017-02-26 DIAGNOSIS — F329 Major depressive disorder, single episode, unspecified: Secondary | ICD-10-CM

## 2017-02-26 DIAGNOSIS — I2510 Atherosclerotic heart disease of native coronary artery without angina pectoris: Secondary | ICD-10-CM

## 2017-02-26 DIAGNOSIS — D6859 Other primary thrombophilia: Secondary | ICD-10-CM

## 2017-02-26 DIAGNOSIS — R140 Abdominal distension (gaseous): Secondary | ICD-10-CM

## 2017-02-26 DIAGNOSIS — R918 Other nonspecific abnormal finding of lung field: Secondary | ICD-10-CM

## 2017-02-26 DIAGNOSIS — Z794 Long term (current) use of insulin: Secondary | ICD-10-CM

## 2017-02-26 DIAGNOSIS — Z8701 Personal history of pneumonia (recurrent): Secondary | ICD-10-CM

## 2017-02-26 DIAGNOSIS — R011 Cardiac murmur, unspecified: Secondary | ICD-10-CM

## 2017-02-26 DIAGNOSIS — E119 Type 2 diabetes mellitus without complications: Secondary | ICD-10-CM

## 2017-02-26 LAB — BASIC METABOLIC PANEL
ANION GAP: 8 (ref 5–15)
BUN: 11 mg/dL (ref 6–20)
CHLORIDE: 108 mmol/L (ref 101–111)
CO2: 29 mmol/L (ref 22–32)
CREATININE: 1.43 mg/dL — AB (ref 0.61–1.24)
Calcium: 8.6 mg/dL — ABNORMAL LOW (ref 8.9–10.3)
GFR calc non Af Amer: 52 mL/min — ABNORMAL LOW (ref >60)
Glucose, Bld: 125 mg/dL — ABNORMAL HIGH (ref 65–99)
POTASSIUM: 3.6 mmol/L (ref 3.5–5.1)
SODIUM: 145 mmol/L (ref 135–145)

## 2017-02-26 LAB — CBC
HEMATOCRIT: 36.8 % — AB (ref 40.0–52.0)
HEMOGLOBIN: 12.6 g/dL — AB (ref 13.0–18.0)
MCH: 33.7 pg (ref 26.0–34.0)
MCHC: 34.2 g/dL (ref 32.0–36.0)
MCV: 98.5 fL (ref 80.0–100.0)
PLATELETS: 200 10*3/uL (ref 150–440)
RBC: 3.74 MIL/uL — AB (ref 4.40–5.90)
RDW: 14.1 % (ref 11.5–14.5)
WBC: 7.2 10*3/uL (ref 3.8–10.6)

## 2017-02-26 LAB — HEPARIN LEVEL (UNFRACTIONATED)
Heparin Unfractionated: <0.1 IU/mL — ABNORMAL LOW (ref 0.30–0.70)
Heparin Unfractionated: 2.56 IU/mL — ABNORMAL HIGH (ref 0.30–0.70)
Heparin Unfractionated: 1.8 IU/mL — ABNORMAL HIGH (ref 0.30–0.70)

## 2017-02-26 LAB — GLUCOSE, CAPILLARY
GLUCOSE-CAPILLARY: 92 mg/dL (ref 65–99)
GLUCOSE-CAPILLARY: 143 mg/dL — AB (ref 65–99)
Glucose-Capillary: 148 mg/dL — ABNORMAL HIGH (ref 65–99)
Glucose-Capillary: 117 mg/dL — ABNORMAL HIGH (ref 65–99)

## 2017-02-26 LAB — HEMOGLOBIN: Hemoglobin: 12.9 g/dL — ABNORMAL LOW (ref 13.0–18.0)

## 2017-02-26 MED ORDER — HEPARIN (PORCINE) IN NACL 100-0.45 UNIT/ML-% IJ SOLN
1200.0000 [IU]/h | INTRAMUSCULAR | Status: DC
  Administered 2017-02-26: 1200 [IU]/h via INTRAVENOUS

## 2017-02-26 MED ORDER — HEPARIN BOLUS VIA INFUSION
2500.0000 [IU] | Freq: Once | INTRAVENOUS | Status: AC
  Administered 2017-02-26: 2500 [IU] via INTRAVENOUS
  Filled 2017-02-26: qty 2500

## 2017-02-26 MED ORDER — HEPARIN (PORCINE) IN NACL 100-0.45 UNIT/ML-% IJ SOLN
800.0000 [IU]/h | INTRAMUSCULAR | Status: DC
  Administered 2017-02-27: 1000 [IU]/h via INTRAVENOUS

## 2017-02-26 NOTE — Progress Notes (Signed)
ANTICOAGULATION CONSULT NOTE - Initial Consult  Pharmacy Consult for heparin drip Indication: DVT  Allergies  Allergen Reactions  . Aspirin Anaphylaxis  . Bee Venom Anaphylaxis  . Penicillins Anaphylaxis and Other (See Comments)    Has patient had a PCN reaction causing immediate rash, facial/tongue/throat swelling, SOB or lightheadedness with hypotension: Yes Has patient had a PCN reaction causing severe rash involving mucus membranes or skin necrosis: No Has patient had a PCN reaction that required hospitalization No Has patient had a PCN reaction occurring within the last 10 years: Yes If all of the above answers are "NO", then may proceed with Cephalosporin use.  . Prednisone Anaphylaxis  . Sulfa Antibiotics Anaphylaxis  . Sulfasalazine Anaphylaxis  . Theophyllines Anaphylaxis  . Theophylline Swelling    Patient Measurements: Height: 5\' 5"  (165.1 cm) Weight: 185 lb (83.9 kg) IBW/kg (Calculated) : 61.5 Heparin Dosing Weight: 79 kg  Vital Signs: Temp: 98.3 F (36.8 C) (07/21 2030) Temp Source: Oral (07/21 2030) BP: 137/83 (07/21 2030) Pulse Rate: 76 (07/21 2030)  Labs:  Recent Labs  02/25/17 1603  02/25/17 2241 02/26/17 0510 02/26/17 1217 02/26/17 2047  HGB 13.6  --   --  12.6*  --  12.9*  HCT 40.3  --   --  36.8*  --   --   PLT 232  --   --  200  --   --   APTT  --   --  97*  --   --   --   LABPROT 32.1*  --   --   --   --   --   INR 3.04  --   --   --   --   --   HEPARINUNFRC  --   < > 0.19* <0.10* 2.56* 1.80*  CREATININE 1.41*  --   --  1.43*  --   --   < > = values in this interval not displayed.  Estimated Creatinine Clearance: 55.5 mL/min (A) (by C-G formula based on SCr of 1.43 mg/dL (H)).    Assessment: Patient admitted for R leg pain from hip to knee x 7 days. Patient has a h/o of DVTs and has an IVC filter placed and is anticoagulated w/ warfarin at home (2 mg on Friday, Saturday -- then 4 mg all other days). 7/20 1603: Patient's current INR 3.04  (therapeutic) -- patient may have failed warfarin therapy. Is being started on heparin drip.  Goal of Therapy:  Heparin level 0.3-0.7 units/ml Monitor platelets by anticoagulation protocol: Yes   Plan:  HL Has been subtherapeutic x 2 but is now supratherapeutic at 2.56  Do not suspect results due to lab draw error as RN reports heparin running into IV in Right hand, labs drawn from Left hand. No bleeding noted, does have some bruising at the site of lab draw.   Current orders for heparin 1500 units/hr.  Will hold heparin x 1 hour and resume at reduced of 1200 units/hr.   Recheck HL 6 hours after restart. RN to monitor for signs of bleeding.  7/21 @ 2100 HL 1.80 supratherapeutic. Will hold heparin drip for 1 hour and restart at 1000 units/hr and recheck Hl @ 0300.  Thomasene Rippleavid Khalel Alms, PharmD, BCPS Clinical Pharmacist 02/26/2017

## 2017-02-26 NOTE — Consult Note (Signed)
[facility] CONSULT NOTE  Patient Care Team: Hugelmeyer, Jon GillsAlexis, DO as PCP - General (Family Medicine)  CHIEF COMPLAINTS/PURPOSE OF CONSULTATION:  Recurrent DVT  HISTORY OF PRESENTING ILLNESS:  Brian Hampton 61 y.o.  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   In Summer 2017- patient had a DVT of the left lower extremity for which he was started on Eliquis by PCP. While on anticoagulation with Eliquis- August 2017- patient  On Outside CTA- noted to have left lower lobes segmental and subsegmental PE; and also DVT of right lower extremity. Patient had IVC filter placement at the time; and discharged home on Coumadin. Patient stated that he has been compliant with Coumadin - and as per the patient his INR has been around 3 over the last 2 months.  Patient noted to have worsening pain and swelling of the right lower extremity-that led to admission to the hospital- where venous Doppler showed DVT/acute thrombosis of the right femoral-popliteal vein extending from the proximal mid thigh to the ankle. CTA chest negative for PE. Patient is currently on IV heparin.   Patient notes to have slight improvement of his pain; however is requesting pain medication.   Patient admits to feeling bloated. Denies any worsening shortness of breath or chest pain.  ROS: A complete 10 point review of system is done which is negative except mentioned above in history of present illness  MEDICAL HISTORY:  Past Medical History:  Diagnosis Date  . Anemia   . Asthma   . Cataracts, bilateral    worse in Rt eye  . Collagen vascular disease (HCC)   . COPD (chronic obstructive pulmonary disease) (HCC)   . Coronary artery disease   . Depression   . DVT (deep venous thrombosis) (HCC)   . Dysrhythmia   . Emphysema/COPD (HCC)   . Environmental and seasonal  allergies   . H/O blood clots   . Heart murmur   . HIV (human immunodeficiency virus infection) (HCC)   . Hypertension   . Leaky heart valve    x 3  . Lung mass   . Myocardial infarction (HCC)    in 2000  . Pneumonia    year ago  . Pulmonary emboli (HCC)   . Type 2 diabetes mellitus (HCC)     SURGICAL HISTORY: Past Surgical History:  Procedure Laterality Date  . ANKLE ARTHROSCOPY    . IVC FILTER INSERTION    . KYPHOPLASTY N/A 12/21/2016   Procedure: KYPHOPLASTY;  Surgeon: Kennedy BuckerMenz, Michael, MD;  Location: [facility] ORS;  Service: Orthopedics;  Laterality: N/A;  . PERIPHERAL VASCULAR CATHETERIZATION N/A 03/16/2016   Procedure: IVC Filter Insertion;  Surgeon: Renford DillsGregory G Schnier, MD;  Location: [facility] INVASIVE CV LAB;  Service: Cardiovascular;  Laterality: N/A;  . SINUS EXPLORATION    . WRIST ARTHROSCOPY Right     SOCIAL HISTORY: Social History   Social History  . Marital status: Widowed    Spouse name: N/A  . Number of children: N/A  . Years of education: N/A   Occupational History  . Not on file.   Social History Main Topics  . Smoking status: Former Smoker    Quit date: 12/16/2001  . Smokeless tobacco: Never Used  . Alcohol use No  . Drug use: No  . Sexual activity: Not on file   Other Topics Concern  . Not on file  Social History Narrative  . No narrative on file    FAMILY HISTORY: Family History  Problem Relation Age of Onset  . CAD Unknown     ALLERGIES:  is allergic to aspirin; bee venom; penicillins; prednisone; sulfa antibiotics; sulfasalazine; theophyllines; and theophylline.  MEDICATIONS:  Current Facility-Administered Medications  Medication Dose Route Frequency Provider Last Rate Last Dose  . 0.9 %  sodium chloride infusion  250 mL Intravenous PRN Hugelmeyer, Alexis, DO      . abacavir-dolutegravir-lamiVUDine (TRIUMEQ) 600-50-300 MG per tablet 1 tablet  1 tablet Oral QPC breakfast Hugelmeyer, Alexis, DO   1 tablet at 02/26/17 1034  . acetaminophen  (TYLENOL) tablet 650 mg  650 mg Oral Q6H PRN Hugelmeyer, Alexis, DO       Or  . acetaminophen (TYLENOL) suppository 650 mg  650 mg Rectal Q6H PRN Hugelmeyer, Alexis, DO      . albuterol (PROVENTIL) (2.5 MG/3ML) 0.083% nebulizer solution 2.5 mg  2.5 mg Inhalation Q6H PRN Hugelmeyer, Alexis, DO      . albuterol (PROVENTIL) (2.5 MG/3ML) 0.083% nebulizer solution 2.5 mg  2.5 mg Nebulization TID Hugelmeyer, Alexis, DO   2.5 mg at 02/26/17 1336  . atorvastatin (LIPITOR) tablet 10 mg  10 mg Oral QPC breakfast Hugelmeyer, Alexis, DO      . bisacodyl (DULCOLAX) EC tablet 5 mg  5 mg Oral Daily PRN Hugelmeyer, Alexis, DO      . calcium-vitamin D (OSCAL WITH D) 500-200 MG-UNIT per tablet 1 tablet  1 tablet Oral Q breakfast Hugelmeyer, Alexis, DO   1 tablet at 02/26/17 1038  . carvedilol (COREG) tablet 12.5 mg  12.5 mg Oral BID WC Hugelmeyer, Alexis, DO   12.5 mg at 02/26/17 1036  . escitalopram (LEXAPRO) tablet 20 mg  20 mg Oral QPC breakfast Hugelmeyer, Alexis, DO   20 mg at 02/26/17 1036  . furosemide (LASIX) tablet 20 mg  20 mg Oral Daily Hugelmeyer, Alexis, DO   20 mg at 02/26/17 1046  . heparin ADULT infusion 100 units/mL (25000 units/237mL sodium chloride 0.45%)  1,200 Units/hr Intravenous Continuous Houston Siren, MD 12 mL/hr at 02/26/17 1440 1,200 Units/hr at 02/26/17 1440  . HYDROcodone-acetaminophen (NORCO/VICODIN) 5-325 MG per tablet 1 tablet  1 tablet Oral Q4H PRN Hugelmeyer, Alexis, DO   1 tablet at 02/26/17 1248  . insulin aspart (novoLOG) injection 0-15 Units  0-15 Units Subcutaneous TID WC Hugelmeyer, Alexis, DO   2 Units at 02/26/17 1248  . insulin aspart (novoLOG) injection 0-5 Units  0-5 Units Subcutaneous QHS Hugelmeyer, Alexis, DO      . ketorolac (ACULAR) 0.5 % ophthalmic solution 1 drop  1 drop Left Eye QID Hugelmeyer, Alexis, DO   1 drop at 02/26/17 1037  . linaclotide (LINZESS) capsule 72 mcg  72 mcg Oral QPC breakfast Hugelmeyer, Alexis, DO   72 mcg at 02/26/17 1046  . loratadine  (CLARITIN) tablet 10 mg  10 mg Oral Daily Hugelmeyer, Alexis, DO   10 mg at 02/26/17 1035  . magnesium citrate solution 1 Bottle  1 Bottle Oral Once PRN Hugelmeyer, Alexis, DO      . magnesium oxide (MAG-OX) tablet 400 mg  400 mg Oral QPC breakfast Hugelmeyer, Alexis, DO   400 mg at 02/26/17 1036  . meloxicam (MOBIC) tablet 7.5 mg  7.5 mg Oral Daily PRN Hugelmeyer, Alexis, DO   7.5 mg at 02/26/17 0350  . mirtazapine (REMERON) tablet 15 mg  15 mg Oral QPM Hugelmeyer, Alexis, DO   15 mg at  02/25/17 2351  . mometasone-formoterol (DULERA) 200-5 MCG/ACT inhaler 2 puff  2 puff Inhalation TID Hugelmeyer, Alexis, DO   2 puff at 02/26/17 1036  . multivitamin with minerals tablet 1 tablet  1 tablet Oral Daily Hugelmeyer, Alexis, DO   1 tablet at 02/26/17 1035  . nitroGLYCERIN (NITROSTAT) SL tablet 0.4 mg  0.4 mg Sublingual Q5 min PRN Hugelmeyer, Alexis, DO      . ofloxacin (OCUFLOX) 0.3 % ophthalmic solution 1 drop  1 drop Left Eye QID Hugelmeyer, Alexis, DO   1 drop at 02/26/17 1037  . ondansetron (ZOFRAN) tablet 4 mg  4 mg Oral Q6H PRN Hugelmeyer, Alexis, DO       Or  . ondansetron (ZOFRAN) injection 4 mg  4 mg Intravenous Q6H PRN Hugelmeyer, Alexis, DO      . pantoprazole (PROTONIX) EC tablet 40 mg  40 mg Oral Daily Hugelmeyer, Alexis, DO   40 mg at 02/26/17 1036  . potassium chloride (K-DUR,KLOR-CON) CR tablet 10 mEq  10 mEq Oral Daily Hugelmeyer, Alexis, DO   10 mEq at 02/26/17 1038  . prednisoLONE acetate (PRED FORTE) 1 % ophthalmic suspension 1 drop  1 drop Right Eye BID Hugelmeyer, Alexis, DO   1 drop at 02/26/17 1037  . senna-docusate (Senokot-S) tablet 1 tablet  1 tablet Oral QHS PRN Hugelmeyer, Alexis, DO      . sodium chloride flush (NS) 0.9 % injection 3 mL  3 mL Intravenous Q12H Hugelmeyer, Alexis, DO   3 mL at 02/26/17 1046  . sodium chloride flush (NS) 0.9 % injection 3 mL  3 mL Intravenous PRN Hugelmeyer, Alexis, DO      . tiotropium (SPIRIVA) inhalation capsule 18 mcg  18 mcg Inhalation QPC  breakfast Hugelmeyer, Alexis, DO   18 mcg at 02/26/17 1033  . traMADol (ULTRAM) tablet 50 mg  50 mg Oral BID Hugelmeyer, Alexis, DO   50 mg at 02/26/17 1035  . zolpidem (AMBIEN) tablet 5 mg  5 mg Oral QHS Hugelmeyer, Alexis, DO   5 mg at 02/25/17 2352      .  PHYSICAL EXAMINATION:  Vitals:   02/26/17 1231 02/26/17 1440  BP: (!) 134/91   Pulse: 82 76  Resp:    Temp: 98.4 F (36.9 C)    Filed Weights   02/25/17 1601  Weight: 185 lb (83.9 kg)    GENERAL: Well-nourished well-developed; Alert, no distress and comfortable.   Alone. EYES: no pallor or icterus OROPHARYNX: no thrush or ulceration. NECK: supple, no masses felt LYMPH:  no palpable lymphadenopathy in the cervical, axillary or inguinal regions LUNGS: decreased breath sounds to auscultation at bases and  No wheeze or crackles HEART/CVS: regular rate & rhythm and no murmurs; right lower extremity extremity swollen compared to the left. Positive for pulses. Positive for tenderness. ABDOMEN: abdomen soft, non-tender and normal bowel sounds Musculoskeletal:no cyanosis of digits and no clubbing  PSYCH: alert & oriented x 3 with fluent speech NEURO: no focal motor/sensory deficits SKIN:  no rashes or significant lesions  LABORATORY DATA:  I have reviewed the data as listed Lab Results  Component Value Date   WBC 7.2 02/26/2017   HGB 12.6 (L) 02/26/2017   HCT 36.8 (L) 02/26/2017   MCV 98.5 02/26/2017   PLT 200 02/26/2017    Recent Labs  03/15/16 1549  05/05/16 1632  12/21/16 2251 02/25/17 1603 02/26/17 0510  NA 140  < > 146*  < > 137 139 145  K 3.6  < > <  2.0*  < > 3.3* 3.5 3.6  CL 108  < > 101  < > 103 108 108  CO2 26  < > 33*  < > 27 26 29   GLUCOSE 121*  < > 115*  < > 144* 197* 125*  BUN 7  < > 13  < > 22* 12 11  CREATININE 0.89  < > 1.60*  < > 1.30* 1.41* 1.43*  CALCIUM 8.6*  < > 7.0*  < > 8.7* 8.3* 8.6*  GFRNONAA >60  < > 46*  < > 59* 53* 52*  GFRAA >60  < > 53*  < > >60 >60 >60  PROT 6.4*  --  7.0  --   7.8  --   --   ALBUMIN 2.3*  --  3.1*  --  3.7  --   --   AST 37  --  41  --  24  --   --   ALT 18  --  18  --  21  --   --   ALKPHOS 162*  --  62  --  198*  --   --   BILITOT 0.4  --  0.4  --  0.6  --   --   BILIDIR  --   --  0.1  --   --   --   --   IBILI  --   --  0.3  --   --   --   --   < > = values in this interval not displayed.  RADIOGRAPHIC STUDIES: I have personally reviewed the radiological images as listed and agreed with the findings in the report. Ct Angio Chest Pe W And/or Wo Contrast  Result Date: 02/25/2017 CLINICAL DATA:  Right leg DVT.  Chest pain and dyspnea x1 week. EXAM: CT ANGIOGRAPHY CHEST WITH CONTRAST TECHNIQUE: Multidetector CT imaging of the chest was performed using the standard protocol during bolus administration of intravenous contrast. Multiplanar CT image reconstructions and MIPs were obtained to evaluate the vascular anatomy. CONTRAST:  75 cc Isovue 370 IV COMPARISON:  11/26/2016 CT FINDINGS: Cardiovascular: Satisfactory opacification of the pulmonary arteries to the subsegmental level. No evidence of pulmonary embolism. Normal heart size. Coronary arterial calcifications along the LAD. No pericardial effusion. No aortic aneurysm or dissection. Two vessel takeoff the aortic arch with common origin of the right brachiocephalic and left common carotid arteries. Mediastinum/Nodes: No enlarged mediastinal, hilar, or axillary lymph nodes. Thyroid gland, trachea, and esophagus demonstrate no significant findings. Lungs/Pleura: Lungs demonstrate dependent atelectasis bilaterally. Tiny subpleural bleb in the right upper lobe with a few scattered bowel tiny nonspecific subpleural densities possibly representing chronic postinfectious or postinflammatory change or atelectasis. No pleural effusion or pneumothorax. Upper Abdomen: No acute abnormality.  Tip of an IVC filter is seen. Musculoskeletal: No chest wall abnormality. No acute or significant osseous findings. Old  left-sided healed rib fractures. Old T6 fracture with augmentation and chronic moderate T8 compression fractures. T12-L1 degenerative disc disease with vacuum disc. Review of the MIP images confirms the above findings. IMPRESSION: 1. No acute pulmonary embolus or aortic aneurysm.  No dissection. 2. Coronary arteriosclerosis. 3. No active cardiopulmonary disease. Electronically Signed   By: Tollie Eth M.D.   On: 02/25/2017 20:57   US Venous Img Lower Unilateral Right  Result Date: 02/25/2017 CLINICAL DATA:  Initial evaluation for acute leg pain. EXAM: Right LOWER EXTREMITY VENOUS DOPPLER ULTRASOUND TECHNIQUE: Gray-scale sonography with graded compression, as well as color Doppler and duplex ultrasound  were performed to evaluate the lower extremity deep venous systems from the level of the common femoral vein and including the common femoral, femoral, profunda femoral, popliteal and calf veins including the posterior tibial, peroneal and gastrocnemius veins when visible. The superficial great saphenous vein was also interrogated. Spectral Doppler was utilized to evaluate flow at rest and with distal augmentation maneuvers in the common femoral, femoral and popliteal veins. COMPARISON:  Prior ultrasound from 01/13/2006. FINDINGS: Contralateral Common Femoral Vein: Respiratory phasicity is normal and symmetric with the symptomatic side. No evidence of thrombus. Normal compressibility. Common Femoral Vein: No evidence of thrombus. Normal compressibility, respiratory phasicity and response to augmentation. Saphenofemoral Junction: No evidence of thrombus. Normal compressibility and flow on color Doppler imaging. Profunda Femoral Vein: No evidence of thrombus. Normal compressibility and flow on color Doppler imaging. Femoral Vein: The femoral vein/SFV is duplicated. Occlusive thrombus within 1 set of the right femoral vein beginning at the proximal mid thigh. Loss of normal compressibility. Popliteal Vein: Occlusive  thrombus present throughout the popliteal vein with loss of normal compressibility. Calf Veins: Occlusive thrombus within the right posterior tibial and peroneal veins with loss of normal compressibility. Superficial Great Saphenous Vein: No evidence of thrombus. Normal compressibility and flow on color Doppler imaging. IMPRESSION: Positive study for DVT with occlusive thrombus involving the right femoral and popliteal veins as well as the veins of the calf, extending from the proximal- mid right thigh through to the ankle. Critical Value/emergent results were called by telephone at the time of interpretation on 02/25/2017 at 6:14 pm to Dr. Ileana Roup , who verbally acknowledged these results. Electronically Signed   By: Rise Mu M.D.   On: 02/25/2017 18:18    ASSESSMENT & PLAN:   # 61 year old male patient with a history of recurrent DVT/PE; currently admitted to the hospital for right lower extremity DVT.  # Right lower extremity acute DVT- while on Coumadin [therapeutic INR 3.0]. Previously patient had documented PE and DVT while on Eliquis. And now documented failure while on therapeutic Coumadin. Patient should continue indefinite anticoagulation with Lovenox 1 mg subcutaneous twice a day [renally dosed]. Patient has taken Lovenox at home; and feels comfortable continuing at home at discharge.  #  Hypercoagulable state- the etiology is unclear; however irrespective of the etiology patient is a candidate for indefinite anticoagulation- preferential Lovenox as discussed above. As patient is on anticoagulation- I would recommend checking only for anticardiolipin antibodies. Prothrombin gene mutation; factor V Leiden.   Thank you Dr. Cherlynn Kaiser for allowing me to participate in the care of your pleasant patient. Please do not hesitate to contact me with questions or concerns in the interim.   All questions were answered. The patient knows to call the clinic with any problems, questions or  concerns.    Earna Coder, MD 02/26/2017 3:14 PM

## 2017-02-26 NOTE — Progress Notes (Signed)
ANTICOAGULATION CONSULT NOTE - Initial Consult  Pharmacy Consult for heparin drip Indication: DVT  Allergies  Allergen Reactions  . Aspirin Anaphylaxis  . Bee Venom Anaphylaxis  . Penicillins Anaphylaxis and Other (See Comments)    Has patient had a PCN reaction causing immediate rash, facial/tongue/throat swelling, SOB or lightheadedness with hypotension: Yes Has patient had a PCN reaction causing severe rash involving mucus membranes or skin necrosis: No Has patient had a PCN reaction that required hospitalization No Has patient had a PCN reaction occurring within the last 10 years: Yes If all of the above answers are "NO", then may proceed with Cephalosporin use.  . Prednisone Anaphylaxis  . Sulfa Antibiotics Anaphylaxis  . Sulfasalazine Anaphylaxis  . Theophyllines Anaphylaxis  . Theophylline Swelling    Patient Measurements: Height: 5\' 5"  (165.1 cm) Weight: 185 lb (83.9 kg) IBW/kg (Calculated) : 61.5 Heparin Dosing Weight: 83.9 kg  Vital Signs: Temp: 97.3 F (36.3 C) (07/21 0446) Temp Source: Oral (07/21 0446) BP: 119/83 (07/21 0446) Pulse Rate: 74 (07/21 0446)  Labs:  Recent Labs  02/25/17 1603 02/25/17 2241 02/26/17 0510  HGB 13.6  --  12.6*  HCT 40.3  --  36.8*  PLT 232  --  200  APTT  --  97*  --   LABPROT 32.1*  --   --   INR 3.04  --   --   HEPARINUNFRC  --  0.19* <0.10*  CREATININE 1.41*  --  1.43*    Estimated Creatinine Clearance: 55.5 mL/min (A) (by C-G formula based on SCr of 1.43 mg/dL (H)).   Medical History: Past Medical History:  Diagnosis Date  . Anemia   . Asthma   . Cataracts, bilateral    worse in Rt eye  . Collagen vascular disease (HCC)   . COPD (chronic obstructive pulmonary disease) (HCC)   . Coronary artery disease   . Depression   . DVT (deep venous thrombosis) (HCC)   . Dysrhythmia   . Emphysema/COPD (HCC)   . Environmental and seasonal allergies   . H/O blood clots   . Heart murmur   . HIV (human immunodeficiency  virus infection) (HCC)   . Hypertension   . Leaky heart valve    x 3  . Lung mass   . Myocardial infarction (HCC)    in 2000  . Pneumonia    year ago  . Pulmonary emboli (HCC)   . Type 2 diabetes mellitus (HCC)     Medications:  Scheduled:  . abacavir-dolutegravir-lamiVUDine  1 tablet Oral QPC breakfast  . albuterol  2.5 mg Nebulization TID  . albuterol      . atorvastatin  10 mg Oral QPC breakfast  . calcium-vitamin D  1 tablet Oral Q breakfast  . carvedilol  12.5 mg Oral BID WC  . escitalopram  20 mg Oral QPC breakfast  . furosemide  20 mg Oral Daily  . heparin  2,500 Units Intravenous Once  . insulin aspart  0-15 Units Subcutaneous TID WC  . insulin aspart  0-5 Units Subcutaneous QHS  . ketorolac  1 drop Left Eye QID  . linaclotide  72 mcg Oral QPC breakfast  . loratadine  10 mg Oral Daily  . magnesium oxide  400 mg Oral QPC breakfast  . mirtazapine  15 mg Oral QPM  . mometasone-formoterol  2 puff Inhalation TID  . multivitamin with minerals  1 tablet Oral Daily  . ofloxacin  1 drop Left Eye QID  . pantoprazole  40  mg Oral Daily  . potassium chloride  10 mEq Oral Daily  . prednisoLONE acetate  1 drop Right Eye BID  . sodium chloride flush  3 mL Intravenous Q12H  . tiotropium  18 mcg Inhalation QPC breakfast  . traMADol  50 mg Oral BID  . zolpidem  5 mg Oral QHS    Assessment: Patient admitted for R leg pain from hip to knee x 7 days. Patient has a h/o of DVTs and has an IVC filter placed and is anticoagulated w/ warfarin at home (2 mg on Friday, Saturday -- then 4 mg all other days). 7/20 1603: Patient's current INR 3.04 (therapeutic) -- patient may have failed warfarin therapy. Is being started on heparin drip.  Goal of Therapy:  Heparin level 0.3-0.7 units/ml Monitor platelets by anticoagulation protocol: Yes   Plan:  Given the nature of the patient's history of DVT and placement of IVC filter and what appears to be failure of warfarin therapy will bolus w/  heparin 4000 units IV x 1. Will start the heparin drip @ 1300 units/hr (16 units/kg). Baseline labs ordered (INR therapeutic @ 3.04). Will check HL @ 0500.  7/21 @ 0500 HL < 0.10 subtherapeutic. Confirmed w/ RN that drip was not stopped during the night. Will rebolus w/ heparin 2500 units IV x 1 and will increase rate to 1500 units/hr and will recheck HL @ 1100.  Thomasene Rippleavid Shanaye Rief, PharmD, BCPS Clinical Pharmacist 02/26/2017

## 2017-02-26 NOTE — Evaluation (Signed)
Physical Therapy Evaluation Patient Details Name: Brian Hampton MRN: [id number] DOB: September 01, 1955 Today's Date: 02/26/2017   History of Present Illness  Pt is a 61 y.o.malewith a known history of Anemia, asthma, COPD, coronary artery disease, depression, DVT on Coumadin, hypertension, diabetespresents to the emergency department for evaluation of leg pain. Patient was in a usual state of health until one week ago and describes the onset of left hip pain radiating to his knee which is gradually worsening. He also complains ofchest pain, dyspnea on exertion. 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  Assessment includes: acute RLE DVT on heparin drip, Hx of HIV, depression, HTN, COPD, and DM.     Clinical Impression  Pt presents with deficits in strength, transfers, gait, balance, and activity tolerance.  Pt Ind with bed mobility tasks and SBA with transfers.  Pt able to amb 50' with RW and CGA.  Antalgic on R with pt reporting feeling weak in the RLE and that RLE felt like it could buckle; SpO2 down from baseline of 95% to 92% after amb with HR up from 76 bpm to 82 bpm, pt reports no SOB or other symptons, nursing informed. Pt will benefit from HHPT services to address above deficits for decreased caregiver assistance and return to PLOF upon discharge.      Follow Up Recommendations Home health PT    Equipment Recommendations  Rolling walker with 5" wheels (Pt [facility] QC, [facility]ator[facility]    Recommendations for Other Services       Precautions / Restrictions Precautions Precautions: Fall Restrictions Weight Bearing Restrictions: No      Mobility  Bed Mobility Overal bed mobility: Independent                Transfers Overall transfer level: Needs assistance Equipment used: Rolling walker (2 wheeled) Transfers: Sit to/from Stand Sit to Stand: Supervision         General transfer comment: Good effort and steady during transfers with  RW  Ambulation/Gait Ambulation/Gait assistance: Min guard Ambulation Distance (Feet): 50 Feet Assistive device: Rolling walker (2 wheeled) Gait Pattern/deviations: Step-through pattern;Decreased step length - right;Decreased step length - left;Antalgic   Gait velocity interpretation: Below normal speed for age/gender General Gait Details: Antalgic on R with pt reporting feeling weak in the RLE and that RLE felt like it could buckle; SpO2 down from baseline of 95% to 92% after amb with HR up from 76 bpm to 82 bpm, pt reports no SOB or other symptons, nursing informed.   Stairs            Wheelchair Mobility    Modified Rankin (Stroke Patients Only)       Balance Overall balance assessment: Needs assistance Sitting-balance support: No upper extremity supported;Feet supported Sitting balance-Leahy Scale: Normal     Standing balance support: No upper extremity supported Standing balance-Leahy Scale: Fair                               Pertinent Vitals/Pain Pain Assessment: 0-10 Pain Score: 10-Worst pain ever Pain Location: RLE Pain Descriptors / Indicators: Aching Pain Intervention(s): Premedicated before session;Monitored during session;Limited activity within patient's tolerance    Home Living Family/patient expects to be discharged to:: Other (Comment) (Boarding house per pt)                 Additional Comments: Boarding house is [employer]story  and has a level entry    Prior Function Level of Independence: Independent         Comments: Ind Amb without AD community distances, occasional use of QC but rarely, no fall history in last year     Hand Dominance   Dominant Hand: Right    Extremity/Trunk Assessment   Upper Extremity Assessment Upper Extremity Assessment: Overall WFL for tasks assessed    Lower Extremity Assessment Lower Extremity Assessment: Generalized weakness;RLE deficits/detail RLE: Unable to fully assess due to pain        Communication   Communication: No difficulties  Cognition Arousal/Alertness: Awake/alert Behavior During Therapy: WFL for tasks assessed/performed Overall Cognitive Status: Within Functional Limits for tasks assessed                                        General Comments      Exercises Total Joint Exercises Ankle Circles/Pumps: AROM;Both;10 reps;15 reps Quad Sets: Strengthening;Both;10 reps Gluteal Sets: Strengthening;Both;10 reps Long Arc Quad: AROM;Both;10 reps;15 reps Marching in Standing: AROM;Both;10 reps;15 reps Other Exercises Other Exercises: HEP education for B APs, QS, and GS x 10 each 5-6x/day   Assessment/Plan    PT Assessment Patient needs continued PT services  PT Problem List Decreased strength;Decreased activity tolerance;Decreased balance       PT Treatment Interventions Gait training;Functional mobility training;Neuromuscular re-education;Balance training;Therapeutic activities;Therapeutic exercise;Patient/family education    PT Goals (Current goals can be found in the Care Plan section)  Acute Rehab PT Goals Patient Stated Goal: To get stronger PT Goal Formulation: With patient Time For Goal Achievement: 03/11/17 Potential to Achieve Goals: Good    Frequency Min 2X/week   Barriers to discharge        Co-evaluation               AM-PAC PT "6 Clicks" Daily Activity  Outcome Measure Difficulty turning over in bed (including adjusting bedclothes, sheets and blankets)?: None Difficulty moving from lying on back to sitting on the side of the bed? : None Difficulty sitting down on and standing up from a chair with arms (e.g., wheelchair, bedside commode, etc,.)?: None Help needed moving to and from a bed to chair (including a wheelchair)?: A Little Help needed walking in hospital room?: A Little Help needed climbing 3-5 steps with a railing? : A Little 6 Click Score: 21    End of Session Equipment Utilized During Treatment:  Gait belt Activity Tolerance: Patient limited by fatigue Patient left: in bed;with bed alarm set;with call bell/phone within reach Nurse Communication: Mobility status;Other (comment) (Vital signs with ambulation) PT Visit Diagnosis: Difficulty in walking, not elsewhere classified (R26.2);Muscle weakness (generalized) (M62.81)    Time: 1400-1431 PT Time Calculation (min) (ACUTE ONLY): 31 min   Charges:   PT Evaluation $PT Eval Low Complexity: 1 Procedure PT Treatments $Therapeutic Exercise: 8-22 mins   PT G Codes:        DElly Modena PT, DPT 02/26/17, 2:52 PM

## 2017-02-26 NOTE — Progress Notes (Signed)
[employer] [facility] [place] [facility]   PATIENT NAME: Brian Hampton    MR#:  [id number]  DATE OF BIRTH:  1956/07/03  SUBJECTIVE:   Patient here due to lower extremity pain and swelling secondary to an acute DVT. Patient has a previous history of DVT and PE status post IVC filter currently on Coumadin and INR is therapeutic. Now on a heparin drip.  REVIEW OF SYSTEMS:    Review of Systems  Constitutional: Negative for chills and fever.  HENT: Negative for congestion and tinnitus.   Eyes: Negative for blurred vision and double vision.  Respiratory: Negative for cough, shortness of breath and wheezing.   Cardiovascular: Positive for leg swelling. Negative for chest pain, orthopnea and PND.  Gastrointestinal: Negative for abdominal pain, diarrhea, nausea and vomiting.  Genitourinary: Negative for dysuria and hematuria.  Neurological: Negative for dizziness, sensory change and focal weakness.  All other systems reviewed and are negative.   Nutrition: Heart Healthy Tolerating Diet: Yes Tolerating PT:  Await Eval.     DRUG ALLERGIES:   Allergies  Allergen Reactions  . Aspirin Anaphylaxis  . Bee Venom Anaphylaxis  . Penicillins Anaphylaxis and Other (See Comments)    Has patient had a PCN reaction causing immediate rash, facial/tongue/throat swelling, SOB or lightheadedness with hypotension: Yes Has patient had a PCN reaction causing severe rash involving mucus membranes or skin necrosis: No Has patient had a PCN reaction that required hospitalization No Has patient had a PCN reaction occurring within the last 10 years: Yes If all of the above answers are "NO", then may proceed with Cephalosporin use.  . Prednisone Anaphylaxis  . Sulfa Antibiotics Anaphylaxis  . Sulfasalazine Anaphylaxis  . Theophyllines Anaphylaxis  . Theophylline Swelling    VITALS:  Blood pressure (!) 134/91, pulse 82, temperature 98.4 F (36.9 C), temperature source Oral, resp. rate 19, height  5\' 5"  (1.651 m), weight 83.9 kg (185 lb), SpO2 98 %.  PHYSICAL EXAMINATION:   Physical Exam  GENERAL:  61 y.o.-year-old patient lying in bed in no acute distress.  EYES: Pupils equal, round, reactive to light and accommodation. No scleral icterus. Extraocular muscles intact.  HEENT: Head atraumatic, normocephalic. Oropharynx and nasopharynx clear.  NECK:  Supple, no jugular venous distention. No thyroid enlargement, no tenderness.  LUNGS: Normal breath sounds bilaterally, no wheezing, rales, rhonchi. No use of accessory muscles of respiration.  CARDIOVASCULAR: S1, S2 normal. No murmurs, rubs, or gallops.  ABDOMEN: Soft, nontender, nondistended. Bowel sounds present. No organomegaly or mass.  EXTREMITIES: No cyanosis, clubbing, +1-2 edema b/l R> L.     NEUROLOGIC: Cranial nerves II through XII are intact. No focal Motor or sensory deficits b/l.   PSYCHIATRIC: The patient is alert and oriented x 3.  SKIN: No obvious rash, lesion, or ulcer.    LABORATORY PANEL:   CBC  Recent Labs Lab 02/26/17 0510  WBC 7.2  HGB 12.6*  HCT 36.8*  PLT 200   ------------------------------------------------------------------------------------------------------------------  Chemistries   Recent Labs Lab 02/26/17 0510  NA 145  K 3.6  CL 108  CO2 29  GLUCOSE 125*  BUN 11  CREATININE 1.43*  CALCIUM 8.6*   ------------------------------------------------------------------------------------------------------------------  Cardiac Enzymes No results for input(s): TROPONINI in the last 168 hours. ------------------------------------------------------------------------------------------------------------------  RADIOLOGY:  Ct Angio Chest Pe W And/or Wo Contrast  Result Date: 02/25/2017 CLINICAL DATA:  Right leg DVT.  Chest pain and dyspnea x1 week. EXAM: CT ANGIOGRAPHY CHEST WITH CONTRAST TECHNIQUE: Multidetector CT imaging of the chest was  performed using the standard protocol during bolus  administration of intravenous contrast. Multiplanar CT image reconstructions and MIPs were obtained to evaluate the vascular anatomy. CONTRAST:  75 cc Isovue 370 IV COMPARISON:  11/26/2016 CT FINDINGS: Cardiovascular: Satisfactory opacification of the pulmonary arteries to the subsegmental level. No evidence of pulmonary embolism. Normal heart size. Coronary arterial calcifications along the LAD. No pericardial effusion. No aortic aneurysm or dissection. Two vessel takeoff the aortic arch with common origin of the right brachiocephalic and left common carotid arteries. Mediastinum/Nodes: No enlarged mediastinal, hilar, or axillary lymph nodes. Thyroid gland, trachea, and esophagus demonstrate no significant findings. Lungs/Pleura: Lungs demonstrate dependent atelectasis bilaterally. Tiny subpleural bleb in the right upper lobe with a few scattered bowel tiny nonspecific subpleural densities possibly representing chronic postinfectious or postinflammatory change or atelectasis. No pleural effusion or pneumothorax. Upper Abdomen: No acute abnormality.  Tip of an IVC filter is seen. Musculoskeletal: No chest wall abnormality. No acute or significant osseous findings. Old left-sided healed rib fractures. Old T6 fracture with augmentation and chronic moderate T8 compression fractures. T12-L1 degenerative disc disease with vacuum disc. Review of the MIP images confirms the above findings. IMPRESSION: 1. No acute pulmonary embolus or aortic aneurysm.  No dissection. 2. Coronary arteriosclerosis. 3. No active cardiopulmonary disease. Electronically Signed   By: Tollie Eth M.D.   On: 02/25/2017 20:57   US Venous Img Lower Unilateral Right  Result Date: 02/25/2017 CLINICAL DATA:  Initial evaluation for acute leg pain. EXAM: Right LOWER EXTREMITY VENOUS DOPPLER ULTRASOUND TECHNIQUE: Gray-scale sonography with graded compression, as well as color Doppler and duplex ultrasound were performed to evaluate the lower extremity  deep venous systems from the level of the common femoral vein and including the common femoral, femoral, profunda femoral, popliteal and calf veins including the posterior tibial, peroneal and gastrocnemius veins when visible. The superficial great saphenous vein was also interrogated. Spectral Doppler was utilized to evaluate flow at rest and with distal augmentation maneuvers in the common femoral, femoral and popliteal veins. COMPARISON:  Prior ultrasound from 01/13/2006. FINDINGS: Contralateral Common Femoral Vein: Respiratory phasicity is normal and symmetric with the symptomatic side. No evidence of thrombus. Normal compressibility. Common Femoral Vein: No evidence of thrombus. Normal compressibility, respiratory phasicity and response to augmentation. Saphenofemoral Junction: No evidence of thrombus. Normal compressibility and flow on color Doppler imaging. Profunda Femoral Vein: No evidence of thrombus. Normal compressibility and flow on color Doppler imaging. Femoral Vein: The femoral vein/SFV is duplicated. Occlusive thrombus within 1 set of the right femoral vein beginning at the proximal mid thigh. Loss of normal compressibility. Popliteal Vein: Occlusive thrombus present throughout the popliteal vein with loss of normal compressibility. Calf Veins: Occlusive thrombus within the right posterior tibial and peroneal veins with loss of normal compressibility. Superficial Great Saphenous Vein: No evidence of thrombus. Normal compressibility and flow on color Doppler imaging. IMPRESSION: Positive study for DVT with occlusive thrombus involving the right femoral and popliteal veins as well as the veins of the calf, extending from the proximal- mid right thigh through to the ankle. Critical Value/emergent results were called by telephone at the time of interpretation on 02/25/2017 at 6:14 pm to Dr. Ileana Roup , who verbally acknowledged these results. Electronically Signed   By: Rise Mu M.D.    On: 02/25/2017 18:18     ASSESSMENT AND PLAN:   61 year old male with past medical history of DVT/PE status post IVC filter placement, diabetes, hypertension, history of HIV, COPD, depression, presented to the hospital  due to Right Lower Ext. Pain and swelling and noted to have an Acute DVT.   1. Acute Right Lower Ext. DVT - this is the cause of pt's swelling & Pain.  - pt. Was already on Coumadin and therapeutic with INR  Of 3.   - now on heparin gtt, seen by Vascular surgery and no plans for acute intervention presently. -No plans for thrombolysis. We'll get oncology evaluation to suggest alternative therapy. Patient likely has underlying hypercoagulable state.  2. Hx of HIV - cont. HAART  3. Depression - cont. Lexapro.  4. Hyperlipidemia - cont. Atorvastatin  5. HTN - cont. Coreg  6. COPD-no acute exacerbation-continue albuterol nebulizers as needed, Spiriva, Dulera.  7. GERD-continue Protonix.  8. Diabetes type 2 without complication-continue sliding scale insulin. Blood sugar stable.      All the records are reviewed and case discussed with Care Management/Social Worker. Management plans discussed with the patient, family and they are in agreement.  CODE STATUS: DO NOT RESUSCITATE  DVT Prophylaxis: Heparin drip  TOTAL TIME TAKING CARE OF THIS PATIENT: 30 minutes.   POSSIBLE D/C IN 1-2 DAYS, DEPENDING ON CLINICAL CONDITION.   [place] SirenSAINANI,Kerry-Anne Mezo J M.D on 02/26/2017 at 2:16 PM  Between 7am to 6pm - Pager - [phone]  After 6pm go to [email or web address] - Therapist, nutritionalpassword EPAS [facility]  [facility]  Office  [phone]  CC: Primary care physician; Tonye RoyaltyHugelmeyer, Alexis, DO

## 2017-02-26 NOTE — Consult Note (Signed)
Reason for Consult: Right Leg Pain- acute on Chronic right DVT Referring Physician: Dr. Juliann Mule is an 61 y.o. male.  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 IVC filter placed. Has been on Coumadin-compliant. INR 3.0. Presented with 1 week history of right leg pain, swelling, chest pain and shortness of breath. US revealed Acute on Chronic DVT in right lower extremity. No evidence of PE.  Past Medical History:  Diagnosis Date  . Anemia   . Asthma   . Cataracts, bilateral    worse in Rt eye  . Collagen vascular disease ([place])   . COPD (chronic obstructive pulmonary disease) ([place])   . Coronary artery disease   . Depression   . DVT (deep venous thrombosis) ([place])   . Dysrhythmia   . Emphysema/COPD ([place])   . Environmental and seasonal allergies   . H/O blood clots   . Heart murmur   . HIV (human immunodeficiency virus infection) ([place])   . Hypertension   . Leaky heart valve    x 3  . Lung mass   . Myocardial infarction ([place])    in 2000  . Pneumonia    year ago  . Pulmonary emboli ([place])   . Type 2 diabetes mellitus ([place])     Past Surgical History:  Procedure Laterality Date  . ANKLE ARTHROSCOPY    . IVC FILTER INSERTION    . KYPHOPLASTY N/A 12/21/2016   Procedure: KYPHOPLASTY;  Surgeon: Hessie Knows, MD;  Location: [facility];  Service: Orthopedics;  Laterality: N/A;  . PERIPHERAL VASCULAR CATHETERIZATION N/A 03/16/2016   Procedure: IVC Filter Insertion;  Surgeon: Katha Cabal, MD;  Location: [facility];  Service: Cardiovascular;  Laterality: N/A;  . SINUS EXPLORATION    . WRIST ARTHROSCOPY Right     Family History  Problem Relation Age of Onset  . CAD Unknown     Social History:  reports that he quit smoking about 15 years ago. He has never used smokeless tobacco. He reports that he does not drink alcohol or use drugs.  Allergies:  Allergies  Allergen Reactions  .  Aspirin Anaphylaxis  . Bee Venom Anaphylaxis  . Penicillins Anaphylaxis and Other (See Comments)    Has patient had a PCN reaction causing immediate rash, facial/tongue/throat swelling, SOB or lightheadedness with hypotension: Yes Has patient had a PCN reaction causing severe rash involving mucus membranes or skin necrosis: No Has patient had a PCN reaction that required hospitalization No Has patient had a PCN reaction occurring within the last 10 years: Yes If all of the above answers are "NO", then may proceed with Cephalosporin use.  . Prednisone Anaphylaxis  . Sulfa Antibiotics Anaphylaxis  . Sulfasalazine Anaphylaxis  . Theophyllines Anaphylaxis  . Theophylline Swelling    Medications: I have reviewed the patient's current medications.  Results for orders placed or performed during the hospital encounter of 02/25/17 (from the past 48 hour(s))  CBC     Status: Abnormal   Collection Time: 02/25/17  4:03 PM  Result Value Ref Range   WBC 9.0 3.8 - 10.6 K/uL   RBC 4.13 (L) 4.40 - 5.90 MIL/uL   Hemoglobin 13.6 13.0 - 18.0 g/dL   HCT 40.3 40.0 - 52.0 %   MCV 97.7 80.0 - 100.0 fL   MCH 33.0 26.0 - 34.0 pg   MCHC 33.8 32.0 - 36.0 g/dL   RDW 14.2 11.5 -  14.5 %   Platelets 232 150 - 440 K/uL  Basic metabolic panel     Status: Abnormal   Collection Time: 02/25/17  4:03 PM  Result Value Ref Range   Sodium 139 135 - 145 mmol/L   Potassium 3.5 3.5 - 5.1 mmol/L   Chloride 108 101 - 111 mmol/L   CO2 26 22 - 32 mmol/L   Glucose, Bld 197 (H) 65 - 99 mg/dL   BUN 12 6 - 20 mg/dL   Creatinine, Ser 1.41 (H) 0.61 - 1.24 mg/dL   Calcium 8.3 (L) 8.9 - 10.3 mg/dL   GFR calc non Af Amer 53 (L) >60 mL/min   GFR calc Af Amer >60 >60 mL/min    Comment: (NOTE) The eGFR has been calculated using the CKD EPI equation. This calculation has not been validated in all clinical situations. eGFR's persistently <60 mL/min signify possible Chronic Kidney Disease.    Anion gap 5 5 - 15  Protime-INR      Status: Abnormal   Collection Time: 02/25/17  4:03 PM  Result Value Ref Range   Prothrombin Time 32.1 (H) 11.4 - 15.2 seconds   INR 3.04   Glucose, capillary     Status: Abnormal   Collection Time: 02/25/17 10:37 PM  Result Value Ref Range   Glucose-Capillary 123 (H) 65 - 99 mg/dL  APTT     Status: Abnormal   Collection Time: 02/25/17 10:41 PM  Result Value Ref Range   aPTT 97 (H) 24 - 36 seconds    Comment:        IF BASELINE aPTT IS ELEVATED, SUGGEST PATIENT RISK ASSESSMENT BE USED TO DETERMINE APPROPRIATE ANTICOAGULANT THERAPY.   Heparin level (unfractionated)     Status: Abnormal   Collection Time: 02/25/17 10:41 PM  Result Value Ref Range   Heparin Unfractionated 0.19 (L) 0.30 - 0.70 IU/mL    Comment:        IF HEPARIN RESULTS ARE BELOW EXPECTED VALUES, AND PATIENT DOSAGE HAS BEEN CONFIRMED, SUGGEST FOLLOW UP TESTING OF ANTITHROMBIN III LEVELS.   Basic metabolic panel     Status: Abnormal   Collection Time: 02/26/17  5:10 AM  Result Value Ref Range   Sodium 145 135 - 145 mmol/L   Potassium 3.6 3.5 - 5.1 mmol/L   Chloride 108 101 - 111 mmol/L   CO2 29 22 - 32 mmol/L   Glucose, Bld 125 (H) 65 - 99 mg/dL   BUN 11 6 - 20 mg/dL   Creatinine, Ser 1.43 (H) 0.61 - 1.24 mg/dL   Calcium 8.6 (L) 8.9 - 10.3 mg/dL   GFR calc non Af Amer 52 (L) >60 mL/min   GFR calc Af Amer >60 >60 mL/min    Comment: (NOTE) The eGFR has been calculated using the CKD EPI equation. This calculation has not been validated in all clinical situations. eGFR's persistently <60 mL/min signify possible Chronic Kidney Disease.    Anion gap 8 5 - 15  CBC     Status: Abnormal   Collection Time: 02/26/17  5:10 AM  Result Value Ref Range   WBC 7.2 3.8 - 10.6 K/uL   RBC 3.74 (L) 4.40 - 5.90 MIL/uL   Hemoglobin 12.6 (L) 13.0 - 18.0 g/dL   HCT 36.8 (L) 40.0 - 52.0 %   MCV 98.5 80.0 - 100.0 fL   MCH 33.7 26.0 - 34.0 pg   MCHC 34.2 32.0 - 36.0 g/dL   RDW 14.1 11.5 - 14.5 %   Platelets  200 150 - 440  K/uL  Heparin level (unfractionated)     Status: Abnormal   Collection Time: 02/26/17  5:10 AM  Result Value Ref Range   Heparin Unfractionated <0.10 (L) 0.30 - 0.70 IU/mL    Comment:        IF HEPARIN RESULTS ARE BELOW EXPECTED VALUES, AND PATIENT DOSAGE HAS BEEN CONFIRMED, SUGGEST FOLLOW UP TESTING OF ANTITHROMBIN III LEVELS.   Glucose, capillary     Status: None   Collection Time: 02/26/17  7:55 AM  Result Value Ref Range   Glucose-Capillary 92 65 - 99 mg/dL   Comment 1 Notify RN   Glucose, capillary     Status: Abnormal   Collection Time: 02/26/17 11:38 AM  Result Value Ref Range   Glucose-Capillary 143 (H) 65 - 99 mg/dL   Comment 1 Notify RN     Ct Angio Chest Pe W And/or Wo Contrast  Result Date: 02/25/2017 CLINICAL DATA:  Right leg DVT.  Chest pain and dyspnea x1 week. EXAM: CT ANGIOGRAPHY CHEST WITH CONTRAST TECHNIQUE: Multidetector CT imaging of the chest was performed using the standard protocol during bolus administration of intravenous contrast. Multiplanar CT image reconstructions and MIPs were obtained to evaluate the vascular anatomy. CONTRAST:  75 cc Isovue 370 IV COMPARISON:  11/26/2016 CT FINDINGS: Cardiovascular: Satisfactory opacification of the pulmonary arteries to the subsegmental level. No evidence of pulmonary embolism. Normal heart size. Coronary arterial calcifications along the LAD. No pericardial effusion. No aortic aneurysm or dissection. Two vessel takeoff the aortic arch with common origin of the right brachiocephalic and left common carotid arteries. Mediastinum/Nodes: No enlarged mediastinal, hilar, or axillary lymph nodes. Thyroid gland, trachea, and esophagus demonstrate no significant findings. Lungs/Pleura: Lungs demonstrate dependent atelectasis bilaterally. Tiny subpleural bleb in the right upper lobe with a few scattered bowel tiny nonspecific subpleural densities possibly representing chronic postinfectious or postinflammatory change or atelectasis.  No pleural effusion or pneumothorax. Upper Abdomen: No acute abnormality.  Tip of an IVC filter is seen. Musculoskeletal: No chest wall abnormality. No acute or significant osseous findings. Old left-sided healed rib fractures. Old T6 fracture with augmentation and chronic moderate T8 compression fractures. T12-L1 degenerative disc disease with vacuum disc. Review of the MIP images confirms the above findings. IMPRESSION: 1. No acute pulmonary embolus or aortic aneurysm.  No dissection. 2. Coronary arteriosclerosis. 3. No active cardiopulmonary disease. Electronically Signed   By: Ashley Royalty M.D.   On: 02/25/2017 20:57   US Venous Img Lower Unilateral Right  Result Date: 02/25/2017 CLINICAL DATA:  Initial evaluation for acute leg pain. EXAM: Right LOWER EXTREMITY VENOUS DOPPLER ULTRASOUND TECHNIQUE: Gray-scale sonography with graded compression, as well as color Doppler and duplex ultrasound were performed to evaluate the lower extremity deep venous systems from the level of the common femoral vein and including the common femoral, femoral, profunda femoral, popliteal and calf veins including the posterior tibial, peroneal and gastrocnemius veins when visible. The superficial great saphenous vein was also interrogated. Spectral Doppler was utilized to evaluate flow at rest and with distal augmentation maneuvers in the common femoral, femoral and popliteal veins. COMPARISON:  Prior ultrasound from 01/13/2006. FINDINGS: Contralateral Common Femoral Vein: Respiratory phasicity is normal and symmetric with the symptomatic side. No evidence of thrombus. Normal compressibility. Common Femoral Vein: No evidence of thrombus. Normal compressibility, respiratory phasicity and response to augmentation. Saphenofemoral Junction: No evidence of thrombus. Normal compressibility and flow on color Doppler imaging. Profunda Femoral Vein: No evidence of thrombus. Normal compressibility and flow  on color Doppler imaging. Femoral  Vein: The femoral vein/SFV is duplicated. Occlusive thrombus within 1 set of the right femoral vein beginning at the proximal mid thigh. Loss of normal compressibility. Popliteal Vein: Occlusive thrombus present throughout the popliteal vein with loss of normal compressibility. Calf Veins: Occlusive thrombus within the right posterior tibial and peroneal veins with loss of normal compressibility. Superficial Great Saphenous Vein: No evidence of thrombus. Normal compressibility and flow on color Doppler imaging. IMPRESSION: Positive study for DVT with occlusive thrombus involving the right femoral and popliteal veins as well as the veins of the calf, extending from the proximal- mid right thigh through to the ankle. Critical Value/emergent results were called by telephone at the time of interpretation on 02/25/2017 at 6:14 pm to Dr. Charlotte Crumb , who verbally acknowledged these results. Electronically Signed   By: Jeannine Boga M.D.   On: 02/25/2017 18:18    Review of Systems  Constitutional: Negative.   Eyes: Negative.   Respiratory: Positive for shortness of breath.   Cardiovascular: Positive for leg swelling.  Gastrointestinal: Negative.   Genitourinary: Negative.   Skin: Negative.   Neurological: Negative.    Blood pressure (!) 134/91, pulse 82, temperature 98.4 F (36.9 C), temperature source Oral, resp. rate 19, height '5\' 5"'$  (1.651 m), weight 83.9 kg (185 lb), SpO2 98 %. Physical Exam  Nursing note and vitals reviewed. Constitutional: He is oriented to person, place, and time. He appears well-developed and well-nourished.  Cardiovascular: Normal rate, regular rhythm and intact distal pulses.   Respiratory: Effort normal and breath sounds normal. No respiratory distress. He has no wheezes.  GI: Soft. He exhibits no distension.  Musculoskeletal: Normal range of motion. He exhibits edema and tenderness.  Right leg- thigh and calf tenderness  Neurological: He is alert and oriented to  person, place, and time.  Skin: Skin is warm. No erythema.    Assessment/Plan:  Place on IV Heparin.   Not a candidate for thrombolysis as the majority of the thrombus noted on [place] is chronic and likely 38-52 years old.  Consider Hematology and ID consults- considering his HIV- up to 10x risk of further venous thrombosis. Will need input regarding best anticoagulation longterm as he has been on Eliquis and Coumadin with inadequate coverage.  Consider consulting Ortho/Spine- rule out spine issue. History of kyphoplasty and known L2-L5, S1 degenerative changes. He stated some of the leg pain was similar to prior to his back surgery.  Esco, Miechia A 02/26/2017, 1:19 PM

## 2017-02-26 NOTE — Progress Notes (Signed)
ANTICOAGULATION CONSULT NOTE - Initial Consult  Pharmacy Consult for heparin drip Indication: DVT  Allergies  Allergen Reactions  . Aspirin Anaphylaxis  . Bee Venom Anaphylaxis  . Penicillins Anaphylaxis and Other (See Comments)    Has patient had a PCN reaction causing immediate rash, facial/tongue/throat swelling, SOB or lightheadedness with hypotension: Yes Has patient had a PCN reaction causing severe rash involving mucus membranes or skin necrosis: No Has patient had a PCN reaction that required hospitalization No Has patient had a PCN reaction occurring within the last 10 years: Yes If all of the above answers are "NO", then may proceed with Cephalosporin use.  . Prednisone Anaphylaxis  . Sulfa Antibiotics Anaphylaxis  . Sulfasalazine Anaphylaxis  . Theophyllines Anaphylaxis  . Theophylline Swelling    Patient Measurements: Height: 5\' 5"  (165.1 cm) Weight: 185 lb (83.9 kg) IBW/kg (Calculated) : 61.5 Heparin Dosing Weight: 79 kg  Vital Signs: Temp: 98.4 F (36.9 C) (07/21 1231) Temp Source: Oral (07/21 1231) BP: 134/91 (07/21 1231) Pulse Rate: 82 (07/21 1231)  Labs:  Recent Labs  02/25/17 1603 02/25/17 2241 02/26/17 0510 02/26/17 1217  HGB 13.6  --  12.6*  --   HCT 40.3  --  36.8*  --   PLT 232  --  200  --   APTT  --  97*  --   --   LABPROT 32.1*  --   --   --   INR 3.04  --   --   --   HEPARINUNFRC  --  0.19* <0.10* 2.56*  CREATININE 1.41*  --  1.43*  --     Estimated Creatinine Clearance: 55.5 mL/min (A) (by C-G formula based on SCr of 1.43 mg/dL (H)).    Assessment: Patient admitted for R leg pain from hip to knee x 7 days. Patient has a h/o of DVTs and has an IVC filter placed and is anticoagulated w/ warfarin at home (2 mg on Friday, Saturday -- then 4 mg all other days). 7/20 1603: Patient's current INR 3.04 (therapeutic) -- patient may have failed warfarin therapy. Is being started on heparin drip.  Goal of Therapy:  Heparin level 0.3-0.7  units/ml Monitor platelets by anticoagulation protocol: Yes   Plan:  HL Has been subtherapeutic x 2 but is now supratherapeutic at 2.56  Do not suspect results due to lab draw error as RN reports heparin running into IV in Right hand, labs drawn from Left hand. No bleeding noted, does have some bruising at the site of lab draw.   Current orders for heparin 1500 units/hr.  Will hold heparin x 1 hour and resume at reduced of 1200 units/hr.   Recheck HL 6 hours after restart. RN to monitor for signs of bleeding.  Garlon HatchetJody Lurleen Soltero, PharmD, BCPS Clinical Pharmacist  02/26/2017 1:47 PM

## 2017-02-27 LAB — CBC
HCT: 35.3 % — ABNORMAL LOW (ref 40.0–52.0)
HEMOGLOBIN: 12.1 g/dL — AB (ref 13.0–18.0)
MCH: 33.3 pg (ref 26.0–34.0)
MCHC: 34.3 g/dL (ref 32.0–36.0)
MCV: 97 fL (ref 80.0–100.0)
Platelets: 201 10*3/uL (ref 150–440)
RBC: 3.64 MIL/uL — ABNORMAL LOW (ref 4.40–5.90)
RDW: 14.4 % (ref 11.5–14.5)
WBC: 6.6 10*3/uL (ref 3.8–10.6)

## 2017-02-27 LAB — GLUCOSE, CAPILLARY
GLUCOSE-CAPILLARY: 101 mg/dL — AB (ref 65–99)
GLUCOSE-CAPILLARY: 115 mg/dL — AB (ref 65–99)

## 2017-02-27 LAB — HEPARIN LEVEL (UNFRACTIONATED): HEPARIN UNFRACTIONATED: 1.08 [IU]/mL — AB (ref 0.30–0.70)

## 2017-02-27 MED ORDER — HYDROCODONE-ACETAMINOPHEN 5-325 MG PO TABS: 1.0000 | ORAL_TABLET | ORAL | 0 refills | Status: DC | PRN

## 2017-02-27 MED ORDER — BUDESONIDE 0.5 MG/2ML IN SUSP: 0.5000 mg | Freq: Two times a day (BID) | RESPIRATORY_TRACT | Status: DC

## 2017-02-27 MED ORDER — ENOXAPARIN SODIUM 100 MG/ML ~~LOC~~ SOLN
1.0000 mg/kg | Freq: Two times a day (BID) | SUBCUTANEOUS | Status: DC
  Administered 2017-02-27: 85 mg via SUBCUTANEOUS
  Filled 2017-02-27 (×3): qty 1

## 2017-02-27 MED ORDER — ALBUTEROL SULFATE (2.5 MG/3ML) 0.083% IN NEBU: 2.5000 mg | INHALATION_SOLUTION | Freq: Two times a day (BID) | RESPIRATORY_TRACT | Status: DC

## 2017-02-27 MED ORDER — ENOXAPARIN SODIUM 80 MG/0.8ML ~~LOC~~ SOLN: 80.0000 mg | Freq: Two times a day (BID) | SUBCUTANEOUS | 1 refills | Status: DC

## 2017-02-27 MED ORDER — ENOXAPARIN SODIUM 80 MG/0.8ML ~~LOC~~ SOLN: 85.0000 mg | Freq: Two times a day (BID) | SUBCUTANEOUS | 1 refills | Status: DC

## 2017-02-27 MED ORDER — ENOXAPARIN SODIUM 100 MG/ML ~~LOC~~ SOLN
1.0000 mg/kg | Freq: Two times a day (BID) | SUBCUTANEOUS | Status: DC
  Filled 2017-02-27: qty 1

## 2017-02-27 MED ORDER — ENOXAPARIN SODIUM 80 MG/0.8ML ~~LOC~~ SOLN
80.0000 mg | Freq: Two times a day (BID) | SUBCUTANEOUS | Status: DC
  Filled 2017-02-27: qty 0.8

## 2017-02-27 MED ORDER — HEPARIN (PORCINE) IN NACL 100-0.45 UNIT/ML-% IJ SOLN
800.0000 [IU]/h | INTRAMUSCULAR | Status: DC
  Administered 2017-02-27: 800 [IU]/h via INTRAVENOUS

## 2017-02-27 NOTE — Progress Notes (Signed)
Brian Hampton   DOB:[birth year]   RU#:[id number]#:[id number]    Subjective: No acute events overnight. Patient denies a bleeding episode. His pain in the right lower extremity is improving. However needing to take pain medications.  ROS: Otherwise no chest pain or shortness of breath or cough.  Objective:  Vitals:   02/27/17 0455 02/27/17 1001  BP: 114/77 (!) 135/93  Pulse: 71 78  Resp: 16   Temp: 98.3 F (36.8 C) 98.1 F (36.7 C)     Intake/Output Summary (Last 24 hours) at 02/27/17 1256 Last data filed at 02/27/17 1014  Gross per 24 hour  Intake              941 ml  Output              350 ml  Net              591 ml    GENERAL: Well-nourished well-developed; Alert, no distress and comfortable.   Alone. EYES: no pallor or icterus OROPHARYNX: no thrush or ulceration. NECK: supple, no masses felt LYMPH:  no palpable lymphadenopathy in the cervical, axillary or inguinal regions LUNGS: decreased breath sounds to auscultation at bases and  No wheeze or crackles HEART/CVS: regular rate & rhythm and no murmurs; right lower extremity extremity swollen compared to the left. Positive for pulses. Positive for tenderness. ABDOMEN: abdomen soft, non-tender and normal bowel sounds Musculoskeletal:no cyanosis of digits and no clubbing  PSYCH: alert & oriented x 3 with fluent speech NEURO: no focal motor/sensory deficits SKIN:  no rashes or significant lesions   Labs:  Lab Results  Component Value Date   WBC 6.6 02/27/2017   HGB 12.1 (L) 02/27/2017   HCT 35.3 (L) 02/27/2017   MCV 97.0 02/27/2017   PLT 201 02/27/2017   NEUTROABS 5.1 11/26/2016    Lab Results  Component Value Date   NA 145 02/26/2017   K 3.6 02/26/2017   CL 108 02/26/2017   CO2 29 02/26/2017    Studies:  Ct Angio Chest Pe W And/or Wo Contrast  Result Date: 02/25/2017 CLINICAL DATA:  Right leg DVT.  Chest pain and dyspnea x1 week. EXAM: CT ANGIOGRAPHY CHEST WITH CONTRAST TECHNIQUE: Multidetector CT imaging of the chest was  performed using the standard protocol during bolus administration of intravenous contrast. Multiplanar CT image reconstructions and MIPs were obtained to evaluate the vascular anatomy. CONTRAST:  75 cc Isovue 370 IV COMPARISON:  11/26/2016 CT FINDINGS: Cardiovascular: Satisfactory opacification of the pulmonary arteries to the subsegmental level. No evidence of pulmonary embolism. Normal heart size. Coronary arterial calcifications along the LAD. No pericardial effusion. No aortic aneurysm or dissection. Two vessel takeoff the aortic arch with common origin of the right brachiocephalic and left common carotid arteries. Mediastinum/Nodes: No enlarged mediastinal, hilar, or axillary lymph nodes. Thyroid gland, trachea, and esophagus demonstrate no significant findings. Lungs/Pleura: Lungs demonstrate dependent atelectasis bilaterally. Tiny subpleural bleb in the right upper lobe with a few scattered bowel tiny nonspecific subpleural densities possibly representing chronic postinfectious or postinflammatory change or atelectasis. No pleural effusion or pneumothorax. Upper Abdomen: No acute abnormality.  Tip of an IVC filter is seen. Musculoskeletal: No chest wall abnormality. No acute or significant osseous findings. Old left-sided healed rib fractures. Old T6 fracture with augmentation and chronic moderate T8 compression fractures. T12-L1 degenerative disc disease with vacuum disc. Review of the MIP images confirms the above findings. IMPRESSION: 1. No acute pulmonary embolus or aortic aneurysm.  No dissection. 2. Coronary arteriosclerosis.  3. No active cardiopulmonary disease. Electronically Signed   By: Tollie Eth M.D.   On: 02/25/2017 20:57   US Venous Img Lower Unilateral Right  Result Date: 02/25/2017 CLINICAL DATA:  Initial evaluation for acute leg pain. EXAM: Right LOWER EXTREMITY VENOUS DOPPLER ULTRASOUND TECHNIQUE: Gray-scale sonography with graded compression, as well as color Doppler and duplex  ultrasound were performed to evaluate the lower extremity deep venous systems from the level of the common femoral vein and including the common femoral, femoral, profunda femoral, popliteal and calf veins including the posterior tibial, peroneal and gastrocnemius veins when visible. The superficial great saphenous vein was also interrogated. Spectral Doppler was utilized to evaluate flow at rest and with distal augmentation maneuvers in the common femoral, femoral and popliteal veins. COMPARISON:  Prior ultrasound from 01/13/2006. FINDINGS: Contralateral Common Femoral Vein: Respiratory phasicity is normal and symmetric with the symptomatic side. No evidence of thrombus. Normal compressibility. Common Femoral Vein: No evidence of thrombus. Normal compressibility, respiratory phasicity and response to augmentation. Saphenofemoral Junction: No evidence of thrombus. Normal compressibility and flow on color Doppler imaging. Profunda Femoral Vein: No evidence of thrombus. Normal compressibility and flow on color Doppler imaging. Femoral Vein: The femoral vein/SFV is duplicated. Occlusive thrombus within 1 set of the right femoral vein beginning at the proximal mid thigh. Loss of normal compressibility. Popliteal Vein: Occlusive thrombus present throughout the popliteal vein with loss of normal compressibility. Calf Veins: Occlusive thrombus within the right posterior tibial and peroneal veins with loss of normal compressibility. Superficial Great Saphenous Vein: No evidence of thrombus. Normal compressibility and flow on color Doppler imaging. IMPRESSION: Positive study for DVT with occlusive thrombus involving the right femoral and popliteal veins as well as the veins of the calf, extending from the proximal- mid right thigh through to the ankle. Critical Value/emergent results were called by telephone at the time of interpretation on 02/25/2017 at 6:14 pm to Dr. Ileana Roup , who verbally acknowledged these results.  Electronically Signed   By: Rise Mu M.D.   On: 02/25/2017 18:18    Assessment & Plan:   # 61 year old male patient with a history of recurrent DVT/PE; currently admitted to the hospital for right lower extremity DVT.  # Right lower extremity acute DVT- failure of anticoagulation- on Coumadin [INR 3.0; currently]; and documented failure on Eliquis previously.  Patient should continue indefinite anticoagulation with Lovenox 1 mg subcutaneous twice a day [renally dosed; discussed with pharmacy- 80mg  SQ BID]. Patient has taken Lovenox at home; and feels comfortable continuing at home at discharge.  #  Hypercoagulable state- the etiology is unclear; however irrespective of the etiology patient is a candidate for indefinite anticoagulation- preferential Lovenox as discussed above. Work up for anticardiolipin antibodies. Prothrombin gene mutation; factor V Leiden pending at this time.   Discussed with Dr.Sainani. Pt will follow up with PCP; and will follow up with me only as needed.   Cc; Dr.Khan.   Earna Coder, MD 02/27/2017  12:56 PM

## 2017-02-27 NOTE — Discharge Summary (Signed)
[facility] [place] at [facility]   PATIENT NAME: Brian Hampton    MR#:  [id number]  DATE OF BIRTH:  1956-06-27  DATE OF ADMISSION:  02/25/2017 ADMITTING PHYSICIAN: Tonye Royalty, DO  DATE OF DISCHARGE: 02/27/2017  3:06 PM  PRIMARY CARE PHYSICIAN: Mick Sell, MD    ADMISSION DIAGNOSIS:  Deep vein thrombosis (DVT) of right lower extremity, unspecified chronicity, unspecified vein (HCC) [I82.401]  DISCHARGE DIAGNOSIS:  Active Problems:   Acute deep vein thrombosis (DVT) of right lower extremity (HCC)   SECONDARY DIAGNOSIS:   Past Medical History:  Diagnosis Date  . Anemia   . Asthma   . Cataracts, bilateral    worse in Rt eye  . Collagen vascular disease (HCC)   . COPD (chronic obstructive pulmonary disease) (HCC)   . Coronary artery disease   . Depression   . DVT (deep venous thrombosis) (HCC)   . Dysrhythmia   . Emphysema/COPD (HCC)   . Environmental and seasonal allergies   . H/O blood clots   . Heart murmur   . HIV (human immunodeficiency virus infection) (HCC)   . Hypertension   . Leaky heart valve    x 3  . Lung mass   . Myocardial infarction (HCC)    in 2000  . Pneumonia    year ago  . Pulmonary emboli (HCC)   . Type 2 diabetes mellitus [facility])     HOSPITAL COURSE:   61 year old male with past medical history of DVT/PE status post IVC filter placement, diabetes, hypertension, history of HIV, COPD, depression, presented to the hospital due to Right Lower Ext. Pain and swelling and noted to have an Acute DVT.   1. Acute Right Lower Ext. DVT - this was the cause of pt's Lower ext. swelling & Pain.  -Patient was already on Coumadin and INR was therapeutic. A vascular surgery and also a hematology oncology consult was obtained. As per vascular surgery since the clot was old they did not recommend any thrombolysis. Patient is already status post IVC filter in the past. His CT chest was negative for pulmonary embolism. -After being  seen by hematology oncology directive recommended switching the patient from Coumadin to Lovenox indefinitely. Patient is now being discharged on therapeutic Lovenox twice a day. He is also being arranged for home health physical therapy and nursing services  2. Hx of HIV - he will cont. HAART - he will cont. Follow up with ID Dr. Sampson Goon.   3. Depression - he will cont. Lexapro.  4. Hyperlipidemia - he will cont. Atorvastatin  5. HTN - he will cont. Coreg  6. COPD-no acute exacerbation and pt .will cont. His Spiriva, Dulera.   7. GERD- he will continue Protonix.   DISCHARGE CONDITIONS:   Stable.   CONSULTS OBTAINED:  Treatment Team:  Earna Coder, MD Bertram Denver, MD  DRUG ALLERGIES:   Allergies  Allergen Reactions  . Aspirin Anaphylaxis  . Bee Venom Anaphylaxis  . Penicillins Anaphylaxis and Other (See Comments)    Has patient had a PCN reaction causing immediate rash, facial/tongue/throat swelling, SOB or lightheadedness with hypotension: Yes Has patient had a PCN reaction causing severe rash involving mucus membranes or skin necrosis: No Has patient had a PCN reaction that required hospitalization No Has patient had a PCN reaction occurring within the last 10 years: Yes If all of the above answers are "NO", then may proceed with Cephalosporin use.  . Prednisone Anaphylaxis  . Sulfa Antibiotics Anaphylaxis  .  Sulfasalazine Anaphylaxis  . Theophyllines Anaphylaxis  . Theophylline Swelling    DISCHARGE MEDICATIONS:   Allergies as of 02/27/2017      Reactions   Aspirin Anaphylaxis   Bee Venom Anaphylaxis   Penicillins Anaphylaxis, Other (See Comments)   Has patient had a PCN reaction causing immediate rash, facial/tongue/throat swelling, SOB or lightheadedness with hypotension: Yes Has patient had a PCN reaction causing severe rash involving mucus membranes or skin necrosis: No Has patient had a PCN reaction that required hospitalization  No Has patient had a PCN reaction occurring within the last 10 years: Yes If all of the above answers are "NO", then may proceed with Cephalosporin use.   Prednisone Anaphylaxis   Sulfa Antibiotics Anaphylaxis   Sulfasalazine Anaphylaxis   Theophyllines Anaphylaxis   Theophylline Swelling      Medication List    STOP taking these medications   enoxaparin 150 MG/ML injection Commonly known as:  LOVENOX Replaced by:  enoxaparin 80 MG/0.8ML injection   levalbuterol 45 MCG/ACT inhaler Commonly known as:  XOPENEX HFA   warfarin 4 MG tablet Commonly known as:  COUMADIN     TAKE these medications   abacavir-dolutegravir-lamiVUDine 600-50-300 MG tablet Commonly known as:  TRIUMEQ Take 1 tablet by mouth daily after breakfast. Reported on 01/30/2016   albuterol 108 (90 Base) MCG/ACT inhaler Commonly known as:  PROVENTIL HFA;VENTOLIN HFA Inhale 2 puffs into the lungs every 6 (six) hours as needed for wheezing or shortness of breath.   albuterol (2.5 MG/3ML) 0.083% nebulizer solution Commonly known as:  PROVENTIL Take 2.5 mg by nebulization 3 (three) times daily.   atorvastatin 10 MG tablet Commonly known as:  LIPITOR Take 10 mg by mouth daily after breakfast.   CALCIUM 600+D 600-400 MG-UNIT tablet Generic drug:  Calcium Carbonate-Vitamin D Take 1 tablet by mouth daily after breakfast.   carvedilol 12.5 MG tablet Commonly known as:  COREG Take 12.5 mg by mouth 2 (two) times daily with a meal.   cetirizine 10 MG tablet Commonly known as:  ZYRTEC Take 10 mg by mouth daily as needed for allergies.   DULERA 200-5 MCG/ACT Aero Generic drug:  mometasone-formoterol Inhale 2 puffs into the lungs 3 (three) times daily.   enoxaparin 80 MG/0.8ML injection Commonly known as:  LOVENOX Inject 0.8 mLs (80 mg total) into the skin every 12 (twelve) hours. Replaces:  enoxaparin 150 MG/ML injection   EPIPEN 2-PAK 0.3 mg/0.3 mL Soaj injection Generic drug:  EPINEPHrine Inject 0.3 mg  into the muscle once as needed (for severe allergic reaction).   escitalopram 20 MG tablet Commonly known as:  LEXAPRO Take 20 mg by mouth daily after breakfast.   furosemide 20 MG tablet Commonly known as:  LASIX Take 20 mg by mouth daily.   HYDROcodone-acetaminophen 5-325 MG tablet Commonly known as:  NORCO Take 1 tablet by mouth every 4 (four) hours as needed for moderate pain.   ketorolac 0.5 % ophthalmic solution Commonly known as:  ACULAR Place 1 drop into the left eye 4 (four) times daily.   LINZESS 72 MCG capsule Generic drug:  linaclotide Take 72 mcg by mouth daily after breakfast.   magnesium oxide 400 MG tablet Commonly known as:  MAG-OX Take 400 mg by mouth daily after breakfast.   meloxicam 7.5 MG tablet Commonly known as:  MOBIC Take 7.5 mg by mouth daily as needed for pain.   mirtazapine 15 MG tablet Commonly known as:  REMERON Take 15 mg by mouth every evening.  multivitamin with minerals Tabs tablet Take 1 tablet by mouth daily. ONE-A-DAY MULTIVITAMIN 50+   nitroGLYCERIN 0.4 MG SL tablet Commonly known as:  NITROSTAT Place 0.4 mg under the tongue every 5 (five) minutes as needed for chest pain.   ofloxacin 0.3 % ophthalmic solution Commonly known as:  OCUFLOX Place 1 drop into the left eye 4 (four) times daily.   omeprazole 40 MG capsule Commonly known as:  PRILOSEC Take 40 mg by mouth daily after breakfast.   Potassium Chloride CR 8 MEQ Cpcr capsule CR Commonly known as:  MICRO-K Take 1 capsule by mouth daily after breakfast.   prednisoLONE acetate 1 % ophthalmic suspension Commonly known as:  PRED FORTE Place 1 drop into the right eye 2 (two) times daily.   senna-docusate 8.6-50 MG tablet Commonly known as:  Senokot-S Take 1 tablet by mouth at bedtime as needed for mild constipation.   tiotropium 18 MCG inhalation capsule Commonly known as:  SPIRIVA Place 18 mcg into inhaler and inhale daily after breakfast.   traMADol 50 MG  tablet Commonly known as:  ULTRAM Take 1 tablet (50 mg total) by mouth every 6 (six) hours as needed. What changed:  when to take this   zolpidem 5 MG tablet Commonly known as:  AMBIEN Take 5 mg by mouth at bedtime.         DISCHARGE INSTRUCTIONS:   DIET:  Cardiac diet  DISCHARGE CONDITION:  Stable  ACTIVITY:  Activity as tolerated  OXYGEN:  Home Oxygen: No.   Oxygen Delivery: room air  DISCHARGE LOCATION:  Home with  Home Health PT, RN.    If you experience worsening of your admission symptoms, develop shortness of breath, life threatening emergency, suicidal or homicidal thoughts you must seek medical attention immediately by calling 911 or calling your MD immediately  if symptoms less severe.  You Must read complete instructions/literature along with all the possible adverse reactions/side effects for all the Medicines you take and that have been prescribed to you. Take any new Medicines after you have completely understood and accpet all the possible adverse reactions/side effects.   Please note  You were cared for by a hospitalist during your hospital stay. If you have any questions about your discharge medications or the care you received while you were in the hospital after you are discharged, you can call the unit and asked to speak with the hospitalist on call if the hospitalist that took care of you is not available. Once you are discharged, your primary care physician will handle any further medical issues. Please note that NO REFILLS for any discharge medications will be authorized once you are discharged, as it is imperative that you return to your primary care physician (or establish a relationship with a primary care physician if you do not have one) for your aftercare needs so that they can reassess your need for medications and monitor your lab values.     Today   No shortness of breath, chest pain. Still having some pain in the right lower  extremity  VITAL SIGNS:  Blood pressure (!) 145/85, pulse 72, temperature 98 F (36.7 C), temperature source Oral, resp. rate 19, height 5\' 5"  (1.651 m), weight 83.9 kg (185 lb), SpO2 99 %.  I/O:    Intake/Output Summary (Last 24 hours) at 02/27/17 1554 Last data filed at 02/27/17 1341  Gross per 24 hour  Intake             1163 ml  Output              250 ml  Net              913 ml    PHYSICAL EXAMINATION:   GENERAL:  61 y.o.-year-old patient lying in bed in no acute distress.  EYES: Pupils equal, round, reactive to light and accommodation. No scleral icterus. Extraocular muscles intact.  HEENT: Head atraumatic, normocephalic. Oropharynx and nasopharynx clear.  NECK:  Supple, no jugular venous distention. No thyroid enlargement, no tenderness.  LUNGS: Normal breath sounds bilaterally, no wheezing, rales, rhonchi. No use of accessory muscles of respiration.  CARDIOVASCULAR: S1, S2 normal. No murmurs, rubs, or gallops.  ABDOMEN: Soft, nontender, nondistended. Bowel sounds present. No organomegaly or mass.  EXTREMITIES: No cyanosis, clubbing, +1-2 edema b/l R> L.     NEUROLOGIC: Cranial nerves II through XII are intact. No focal Motor or sensory deficits b/l. Globally weak.  PSYCHIATRIC: The patient is alert and oriented x 3.  SKIN: No obvious rash, lesion, or ulcer.   DATA REVIEW:   CBC  Recent Labs Lab 02/27/17 0342  WBC 6.6  HGB 12.1*  HCT 35.3*  PLT 201    Chemistries   Recent Labs Lab 02/26/17 0510  NA 145  K 3.6  CL 108  CO2 29  GLUCOSE 125*  BUN 11  CREATININE 1.43*  CALCIUM 8.6*    Cardiac Enzymes No results for input(s): TROPONINI in the last 168 hours.    RADIOLOGY:  Ct Angio Chest Pe W And/or Wo Contrast  Result Date: 02/25/2017 CLINICAL DATA:  Right leg DVT.  Chest pain and dyspnea x1 week. EXAM: CT ANGIOGRAPHY CHEST WITH CONTRAST TECHNIQUE: Multidetector CT imaging of the chest was performed using the standard protocol during bolus  administration of intravenous contrast. Multiplanar CT image reconstructions and MIPs were obtained to evaluate the vascular anatomy. CONTRAST:  75 cc Isovue 370 IV COMPARISON:  11/26/2016 CT FINDINGS: Cardiovascular: Satisfactory opacification of the pulmonary arteries to the subsegmental level. No evidence of pulmonary embolism. Normal heart size. Coronary arterial calcifications along the LAD. No pericardial effusion. No aortic aneurysm or dissection. Two vessel takeoff the aortic arch with common origin of the right brachiocephalic and left common carotid arteries. Mediastinum/Nodes: No enlarged mediastinal, hilar, or axillary lymph nodes. Thyroid gland, trachea, and esophagus demonstrate no significant findings. Lungs/Pleura: Lungs demonstrate dependent atelectasis bilaterally. Tiny subpleural bleb in the right upper lobe with a few scattered bowel tiny nonspecific subpleural densities possibly representing chronic postinfectious or postinflammatory change or atelectasis. No pleural effusion or pneumothorax. Upper Abdomen: No acute abnormality.  Tip of an IVC filter is seen. Musculoskeletal: No chest wall abnormality. No acute or significant osseous findings. Old left-sided healed rib fractures. Old T6 fracture with augmentation and chronic moderate T8 compression fractures. T12-L1 degenerative disc disease with vacuum disc. Review of the MIP images confirms the above findings. IMPRESSION: 1. No acute pulmonary embolus or aortic aneurysm.  No dissection. 2. Coronary arteriosclerosis. 3. No active cardiopulmonary disease. Electronically Signed   By: Tollie Eth M.D.   On: 02/25/2017 20:57   US Venous Img Lower Unilateral Right  Result Date: 02/25/2017 CLINICAL DATA:  Initial evaluation for acute leg pain. EXAM: Right LOWER EXTREMITY VENOUS DOPPLER ULTRASOUND TECHNIQUE: Gray-scale sonography with graded compression, as well as color Doppler and duplex ultrasound were performed to evaluate the lower extremity  deep venous systems from the level of the common femoral vein and including the common femoral, femoral, profunda femoral, popliteal  and calf veins including the posterior tibial, peroneal and gastrocnemius veins when visible. The superficial great saphenous vein was also interrogated. Spectral Doppler was utilized to evaluate flow at rest and with distal augmentation maneuvers in the common femoral, femoral and popliteal veins. COMPARISON:  Prior ultrasound from 01/13/2006. FINDINGS: Contralateral Common Femoral Vein: Respiratory phasicity is normal and symmetric with the symptomatic side. No evidence of thrombus. Normal compressibility. Common Femoral Vein: No evidence of thrombus. Normal compressibility, respiratory phasicity and response to augmentation. Saphenofemoral Junction: No evidence of thrombus. Normal compressibility and flow on color Doppler imaging. Profunda Femoral Vein: No evidence of thrombus. Normal compressibility and flow on color Doppler imaging. Femoral Vein: The femoral vein/SFV is duplicated. Occlusive thrombus within 1 set of the right femoral vein beginning at the proximal mid thigh. Loss of normal compressibility. Popliteal Vein: Occlusive thrombus present throughout the popliteal vein with loss of normal compressibility. Calf Veins: Occlusive thrombus within the right posterior tibial and peroneal veins with loss of normal compressibility. Superficial Great Saphenous Vein: No evidence of thrombus. Normal compressibility and flow on color Doppler imaging. IMPRESSION: Positive study for DVT with occlusive thrombus involving the right femoral and popliteal veins as well as the veins of the calf, extending from the proximal- mid right thigh through to the ankle. Critical Value/emergent results were called by telephone at the time of interpretation on 02/25/2017 at 6:14 pm to Dr. Ileana Roup , who verbally acknowledged these results. Electronically Signed   By: Rise Mu M.D.    On: 02/25/2017 18:18      Management plans discussed with the patient, family and they are in agreement.  CODE STATUS:     Code Status Orders        Start     Ordered   02/25/17 2353  Do not attempt resuscitation (DNR)  Continuous    Question Answer Comment  In the event of cardiac or respiratory ARREST Do not call a "code blue"   In the event of cardiac or respiratory ARREST Do not perform Intubation, CPR, defibrillation or ACLS   In the event of cardiac or respiratory ARREST Use medication by any route, position, wound care, and other measures to relive pain and suffering. May use oxygen, suction and manual treatment of airway obstruction as needed for comfort.      02/25/17 2352      TOTAL TIME TAKING CARE OF THIS PATIENT: 40 minutes.    Houston Siren M.D on 02/27/2017 at 3:54 PM  Between 7am to 6pm - Pager - [phone]  After 6pm go to [email or web address] - Social research officer, government  [facility]  Office  [phone]  CC: Primary care physician; Mick Sell, MD

## 2017-02-27 NOTE — Progress Notes (Addendum)
ANTICOAGULATION CONSULT NOTE - Initial Consult  Pharmacy Consult for heparin drip Indication: DVT  Allergies  Allergen Reactions  . Aspirin Anaphylaxis  . Bee Venom Anaphylaxis  . Penicillins Anaphylaxis and Other (See Comments)    Has patient had a PCN reaction causing immediate rash, facial/tongue/throat swelling, SOB or lightheadedness with hypotension: Yes Has patient had a PCN reaction causing severe rash involving mucus membranes or skin necrosis: No Has patient had a PCN reaction that required hospitalization No Has patient had a PCN reaction occurring within the last 10 years: Yes If all of the above answers are "NO", then may proceed with Cephalosporin use.  . Prednisone Anaphylaxis  . Sulfa Antibiotics Anaphylaxis  . Sulfasalazine Anaphylaxis  . Theophyllines Anaphylaxis  . Theophylline Swelling    Patient Measurements: Height: 5\' 5"  (165.1 cm) Weight: 185 lb (83.9 kg) IBW/kg (Calculated) : 61.5 Heparin Dosing Weight: 79 kg  Vital Signs: Temp: 98.3 F (36.8 C) (07/21 2030) Temp Source: Oral (07/21 2030) BP: 137/83 (07/21 2030) Pulse Rate: 76 (07/21 2030)  Labs:  Recent Labs  02/25/17 1603  02/25/17 2241 02/26/17 0510 02/26/17 1217 02/26/17 2047 02/27/17 0342  HGB 13.6  --   --  12.6*  --  12.9*  --   HCT 40.3  --   --  36.8*  --   --   --   PLT 232  --   --  200  --   --   --   APTT  --   --  97*  --   --   --   --   LABPROT 32.1*  --   --   --   --   --   --   INR 3.04  --   --   --   --   --   --   HEPARINUNFRC  --   < > 0.19* <0.10* 2.56* 1.80* 1.08*  CREATININE 1.41*  --   --  1.43*  --   --   --   < > = values in this interval not displayed.  Estimated Creatinine Clearance: 55.5 mL/min (A) (by C-G formula based on SCr of 1.43 mg/dL (H)).    Assessment: Patient admitted for R leg pain from hip to knee x 7 days. Patient has a h/o of DVTs and has an IVC filter placed and is anticoagulated w/ warfarin at home (2 mg on Friday, Saturday -- then 4 mg  all other days). 7/20 1603: Patient's current INR 3.04 (therapeutic) -- patient may have failed warfarin therapy. Is being started on heparin drip.  Goal of Therapy:  Heparin level 0.3-0.7 units/ml Monitor platelets by anticoagulation protocol: Yes   Plan:  HL Has been subtherapeutic x 2 but is now supratherapeutic at 2.56  Do not suspect results due to lab draw error as RN reports heparin running into IV in Right hand, labs drawn from Left hand. No bleeding noted, does have some bruising at the site of lab draw.   Current orders for heparin 1500 units/hr.  Will hold heparin x 1 hour and resume at reduced of 1200 units/hr.   Recheck HL 6 hours after restart. RN to monitor for signs of bleeding.  7/21 @ 2100 HL 1.80 supratherapeutic. Will hold heparin drip for 1 hour and restart at 1000 units/hr and recheck Hl @ 0300.  7/22 @ 0342 HL 1.08 supratherapeutic. Will hold heparin drip for another hour and will restart @ 800 units/hr and will recheck HL @ 1000.  Thomasene Ripple, PharmD, BCPS Clinical Pharmacist 02/27/2017

## 2017-02-27 NOTE — Progress Notes (Signed)
Discharged patient home with family, reviewed his discharge summary with him, including medications and how to access MyChart.

## 2017-02-27 NOTE — Care Management Note (Signed)
Case Management Note  Patient Details  Name: Brian Hampton MRN: [id number] Date of Birth: 02/07/1956  Subjective/Objective:    Discussed discharge planning with [name] who chose [facility] from list of providers. Referral for HH-PT, [facility]                 Action/Plan:   Expected Discharge Date:  02/27/17               Expected Discharge Plan:   02/27/17  In-House Referral:     Discharge planning Services     Post Acute Care Choice:   Yes Choice offered to:   Patient  DME Arranged:    DME Agency:     HH Arranged:   PT, RN HH Agency:   [facility]  Status of Service:   Completed.  If discussed at Long Length of Stay Meetings, dates discussed:    Additional Comments:  [name] A, RN 02/27/2017, 10:05 AM

## 2017-02-27 NOTE — Progress Notes (Signed)
Educated Patient on how to self administer lovenox injections, patient indicated understanding and was able to self administer the  Medication with teach back

## 2017-02-28 DIAGNOSIS — I2510 Atherosclerotic heart disease of native coronary artery without angina pectoris: Secondary | ICD-10-CM | POA: Diagnosis not present

## 2017-02-28 DIAGNOSIS — E785 Hyperlipidemia, unspecified: Secondary | ICD-10-CM | POA: Diagnosis not present

## 2017-02-28 DIAGNOSIS — J439 Emphysema, unspecified: Secondary | ICD-10-CM | POA: Diagnosis not present

## 2017-02-28 DIAGNOSIS — D649 Anemia, unspecified: Secondary | ICD-10-CM | POA: Diagnosis not present

## 2017-02-28 DIAGNOSIS — Z86711 Personal history of pulmonary embolism: Secondary | ICD-10-CM | POA: Diagnosis not present

## 2017-02-28 DIAGNOSIS — Z87891 Personal history of nicotine dependence: Secondary | ICD-10-CM | POA: Diagnosis not present

## 2017-02-28 DIAGNOSIS — Z7901 Long term (current) use of anticoagulants: Secondary | ICD-10-CM | POA: Diagnosis not present

## 2017-02-28 DIAGNOSIS — B20 Human immunodeficiency virus [HIV] disease: Secondary | ICD-10-CM | POA: Diagnosis not present

## 2017-02-28 DIAGNOSIS — N189 Chronic kidney disease, unspecified: Secondary | ICD-10-CM | POA: Diagnosis not present

## 2017-02-28 DIAGNOSIS — I129 Hypertensive chronic kidney disease with stage 1 through stage 4 chronic kidney disease, or unspecified chronic kidney disease: Secondary | ICD-10-CM | POA: Diagnosis not present

## 2017-02-28 DIAGNOSIS — E1122 Type 2 diabetes mellitus with diabetic chronic kidney disease: Secondary | ICD-10-CM | POA: Diagnosis not present

## 2017-02-28 DIAGNOSIS — K219 Gastro-esophageal reflux disease without esophagitis: Secondary | ICD-10-CM | POA: Diagnosis not present

## 2017-02-28 DIAGNOSIS — I82401 Acute embolism and thrombosis of unspecified deep veins of right lower extremity: Secondary | ICD-10-CM | POA: Diagnosis not present

## 2017-02-28 DIAGNOSIS — F329 Major depressive disorder, single episode, unspecified: Secondary | ICD-10-CM | POA: Diagnosis not present

## 2017-03-01 DIAGNOSIS — H2512 Age-related nuclear cataract, left eye: Secondary | ICD-10-CM | POA: Diagnosis not present

## 2017-03-03 ENCOUNTER — Emergency Department: Payer: Medicare Other

## 2017-03-03 ENCOUNTER — Observation Stay
Admission: EM | Admit: 2017-03-03 | Discharge: 2017-03-05 | Disposition: A | Discharge status: Home / Self Care | Payer: Medicare Other | Attending: Internal Medicine | Admitting: Internal Medicine

## 2017-03-03 ENCOUNTER — Encounter: Payer: Self-pay | Admitting: *Deleted

## 2017-03-03 ENCOUNTER — Other Ambulatory Visit: Payer: Self-pay

## 2017-03-03 DIAGNOSIS — E119 Type 2 diabetes mellitus without complications: Secondary | ICD-10-CM | POA: Insufficient documentation

## 2017-03-03 DIAGNOSIS — I82431 Acute embolism and thrombosis of right popliteal vein: Secondary | ICD-10-CM | POA: Diagnosis not present

## 2017-03-03 DIAGNOSIS — I499 Cardiac arrhythmia, unspecified: Secondary | ICD-10-CM | POA: Insufficient documentation

## 2017-03-03 DIAGNOSIS — I10 Essential (primary) hypertension: Secondary | ICD-10-CM | POA: Diagnosis not present

## 2017-03-03 DIAGNOSIS — Z886 Allergy status to analgesic agent status: Secondary | ICD-10-CM | POA: Insufficient documentation

## 2017-03-03 DIAGNOSIS — R079 Chest pain, unspecified: Secondary | ICD-10-CM | POA: Diagnosis not present

## 2017-03-03 DIAGNOSIS — R0602 Shortness of breath: Secondary | ICD-10-CM | POA: Diagnosis not present

## 2017-03-03 DIAGNOSIS — R109 Unspecified abdominal pain: Secondary | ICD-10-CM | POA: Diagnosis not present

## 2017-03-03 DIAGNOSIS — R0789 Other chest pain: Secondary | ICD-10-CM | POA: Diagnosis not present

## 2017-03-03 DIAGNOSIS — Z86711 Personal history of pulmonary embolism: Secondary | ICD-10-CM | POA: Insufficient documentation

## 2017-03-03 DIAGNOSIS — R918 Other nonspecific abnormal finding of lung field: Secondary | ICD-10-CM | POA: Diagnosis not present

## 2017-03-03 DIAGNOSIS — R140 Abdominal distension (gaseous): Secondary | ICD-10-CM | POA: Diagnosis not present

## 2017-03-03 DIAGNOSIS — M359 Systemic involvement of connective tissue, unspecified: Secondary | ICD-10-CM | POA: Diagnosis not present

## 2017-03-03 DIAGNOSIS — R011 Cardiac murmur, unspecified: Secondary | ICD-10-CM | POA: Insufficient documentation

## 2017-03-03 DIAGNOSIS — Z87891 Personal history of nicotine dependence: Secondary | ICD-10-CM | POA: Diagnosis not present

## 2017-03-03 DIAGNOSIS — E785 Hyperlipidemia, unspecified: Secondary | ICD-10-CM | POA: Diagnosis not present

## 2017-03-03 DIAGNOSIS — K219 Gastro-esophageal reflux disease without esophagitis: Secondary | ICD-10-CM | POA: Diagnosis not present

## 2017-03-03 DIAGNOSIS — Z888 Allergy status to other drugs, medicaments and biological substances status: Secondary | ICD-10-CM | POA: Diagnosis not present

## 2017-03-03 DIAGNOSIS — Z882 Allergy status to sulfonamides status: Secondary | ICD-10-CM | POA: Diagnosis not present

## 2017-03-03 DIAGNOSIS — F329 Major depressive disorder, single episode, unspecified: Secondary | ICD-10-CM | POA: Insufficient documentation

## 2017-03-03 DIAGNOSIS — B20 Human immunodeficiency virus [HIV] disease: Secondary | ICD-10-CM | POA: Insufficient documentation

## 2017-03-03 DIAGNOSIS — Z791 Long term (current) use of non-steroidal anti-inflammatories (NSAID): Secondary | ICD-10-CM | POA: Insufficient documentation

## 2017-03-03 DIAGNOSIS — Z95828 Presence of other vascular implants and grafts: Secondary | ICD-10-CM | POA: Insufficient documentation

## 2017-03-03 DIAGNOSIS — I252 Old myocardial infarction: Secondary | ICD-10-CM | POA: Diagnosis not present

## 2017-03-03 DIAGNOSIS — I2699 Other pulmonary embolism without acute cor pulmonale: Secondary | ICD-10-CM | POA: Diagnosis not present

## 2017-03-03 DIAGNOSIS — Z91030 Bee allergy status: Secondary | ICD-10-CM | POA: Insufficient documentation

## 2017-03-03 DIAGNOSIS — Z880 Allergy status to penicillin: Secondary | ICD-10-CM | POA: Insufficient documentation

## 2017-03-03 DIAGNOSIS — E039 Hypothyroidism, unspecified: Secondary | ICD-10-CM | POA: Insufficient documentation

## 2017-03-03 DIAGNOSIS — I8001 Phlebitis and thrombophlebitis of superficial vessels of right lower extremity: Secondary | ICD-10-CM | POA: Diagnosis not present

## 2017-03-03 DIAGNOSIS — J449 Chronic obstructive pulmonary disease, unspecified: Secondary | ICD-10-CM | POA: Diagnosis not present

## 2017-03-03 DIAGNOSIS — I82411 Acute embolism and thrombosis of right femoral vein: Secondary | ICD-10-CM | POA: Diagnosis not present

## 2017-03-03 DIAGNOSIS — I2510 Atherosclerotic heart disease of native coronary artery without angina pectoris: Secondary | ICD-10-CM | POA: Insufficient documentation

## 2017-03-03 DIAGNOSIS — Z79899 Other long term (current) drug therapy: Secondary | ICD-10-CM | POA: Insufficient documentation

## 2017-03-03 DIAGNOSIS — R0782 Intercostal pain: Secondary | ICD-10-CM | POA: Diagnosis not present

## 2017-03-03 LAB — CBC
HEMATOCRIT: 39.6 % — AB (ref 40.0–52.0)
Hemoglobin: 13.5 g/dL (ref 13.0–18.0)
MCH: 33.5 pg (ref 26.0–34.0)
MCHC: 34.2 g/dL (ref 32.0–36.0)
MCV: 98.1 fL (ref 80.0–100.0)
PLATELETS: 293 10*3/uL (ref 150–440)
RBC: 4.04 MIL/uL — ABNORMAL LOW (ref 4.40–5.90)
RDW: 14.5 % (ref 11.5–14.5)
WBC: 9.1 10*3/uL (ref 3.8–10.6)

## 2017-03-03 LAB — COMPREHENSIVE METABOLIC PANEL
ALT: 72 U/L — ABNORMAL HIGH (ref 17–63)
AST: 46 U/L — AB (ref 15–41)
Albumin: 3.7 g/dL (ref 3.5–5.0)
Alkaline Phosphatase: 189 U/L — ABNORMAL HIGH (ref 38–126)
Anion gap: 8 (ref 5–15)
BILIRUBIN TOTAL: 0.6 mg/dL (ref 0.3–1.2)
BUN: 17 mg/dL (ref 6–20)
CHLORIDE: 106 mmol/L (ref 101–111)
CO2: 27 mmol/L (ref 22–32)
Calcium: 9.3 mg/dL (ref 8.9–10.3)
Creatinine, Ser: 1.49 mg/dL — ABNORMAL HIGH (ref 0.61–1.24)
GFR, EST AFRICAN AMERICAN: 58 mL/min — AB (ref >60)
GFR, EST NON AFRICAN AMERICAN: 50 mL/min — AB (ref >60)
Glucose, Bld: 113 mg/dL — ABNORMAL HIGH (ref 65–99)
POTASSIUM: 3.6 mmol/L (ref 3.5–5.1)
Sodium: 141 mmol/L (ref 135–145)
TOTAL PROTEIN: 7.9 g/dL (ref 6.5–8.1)

## 2017-03-03 LAB — CARDIOLIPIN ANTIBODIES, IGG, IGM, IGA
Anticardiolipin IgG: <9 GPL U/mL (ref 0–14)
Anticardiolipin IgM: <9 MPL U/mL (ref 0–12)

## 2017-03-03 LAB — PROTIME-INR
INR: 1.07
PROTHROMBIN TIME: 13.9 s (ref 11.4–15.2)

## 2017-03-03 LAB — APTT: APTT: 28 s (ref 24–36)

## 2017-03-03 LAB — TROPONIN I

## 2017-03-03 MED ORDER — MORPHINE SULFATE (PF) 2 MG/ML IV SOLN
2.0000 mg | Freq: Once | INTRAVENOUS | Status: AC
  Administered 2017-03-03: 2 mg via INTRAVENOUS
  Filled 2017-03-03: qty 1

## 2017-03-03 MED ORDER — IOPAMIDOL (ISOVUE-370) INJECTION 76%
75.0000 mL | Freq: Once | INTRAVENOUS | Status: AC | PRN
  Administered 2017-03-03: 75 mL via INTRAVENOUS

## 2017-03-03 MED ORDER — NITROGLYCERIN 2 % TD OINT
1.0000 [in_us] | TOPICAL_OINTMENT | TRANSDERMAL | Status: AC
  Administered 2017-03-03: 1 [in_us] via TOPICAL
  Filled 2017-03-03: qty 1

## 2017-03-03 MED ORDER — ALUM & MAG HYDROXIDE-SIMETH 200-200-20 MG/5ML PO SUSP
15.0000 mL | ORAL | Status: AC
  Administered 2017-03-03: 15 mL via ORAL
  Filled 2017-03-03: qty 30

## 2017-03-03 MED ORDER — MORPHINE SULFATE (PF) 4 MG/ML IV SOLN
4.0000 mg | Freq: Once | INTRAVENOUS | Status: AC
  Administered 2017-03-03: 4 mg via INTRAVENOUS
  Filled 2017-03-03: qty 1

## 2017-03-03 MED ORDER — PANTOPRAZOLE SODIUM 40 MG PO TBEC
40.0000 mg | DELAYED_RELEASE_TABLET | ORAL | Status: AC
  Administered 2017-03-03: 40 mg via ORAL
  Filled 2017-03-03: qty 1

## 2017-03-03 NOTE — ED Provider Notes (Signed)
[facility] Emergency Department Provider Note ____________________________________________   First MD Initiated Contact with Patient 03/03/17 2017     (approximate)  I have reviewed the triage vital signs and the nursing notes.  HISTORY  Chief Complaint Chest Pain  HPI Brian Hampton is a 61 y.o. male presents for evaluation for chest pain  Patient was at a concert this evening, reports he suddenly began experiencing pain in the mid chest does not radiate. He reports it as 9 out of 10 and severe. Reports he has not had this pain before. He did see his cardiologist this morning, and notes that Dr. Park Breed was considering having him get a stress test but is not scheduled  He cannot take aspirin due to anaphylaxis. He is currently on Lovenox twice a day for a clot in his right lower leg Reports ongoing moderate to severe discomfort in the mid chest. Took one nitroglycerin prior to EMS arrival, and also took one with EMS with no relief   Past Medical History:  Diagnosis Date  . Anemia   . Asthma   . Cataracts, bilateral    worse in Rt eye  . Collagen vascular disease (HCC)   . COPD (chronic obstructive pulmonary disease) (HCC)   . Coronary artery disease   . Depression   . DVT (deep venous thrombosis) (HCC)   . Dysrhythmia   . Emphysema/COPD (HCC)   . Environmental and seasonal allergies   . H/O blood clots   . Heart murmur   . HIV (human immunodeficiency virus infection) (HCC)   . Hypertension   . Leaky heart valve    x 3  . Lung mass   . Myocardial infarction (HCC)    in 2000  . Pneumonia    year ago  . Pulmonary emboli (HCC)   . Type 2 diabetes mellitus [facility])     Patient Active Problem List   Diagnosis Date Noted  . Acute deep vein thrombosis (DVT) of right lower extremity (HCC) 02/25/2017  . Postoperative pain   . COPD exacerbation (HCC) 12/23/2016  . Abdominal pain   . Compression fracture of body of thoracic vertebra (HCC) 12/21/2016    . Hypomagnesemia 05/05/2016  . Rhabdomyolysis 05/05/2016  . Acute pulmonary embolism (HCC) 03/17/2016  . Right leg DVT (HCC) 03/17/2016  . Hypokalemia 03/17/2016  . S/P IVC filter 03/17/2016  . SOB (shortness of breath) 03/15/2016  . Dysphagia 02/02/2016  . GERD (gastroesophageal reflux disease) 02/02/2016  . Hyperthyroidism 01/31/2016  . 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 Elevated transaminase level 01/31/2016  . Anemia 01/31/2016  . Thrombocytopenia (HCC) 01/31/2016  . Pyuria 01/31/2016  . Weakness 01/29/2016  . HIV (human immunodeficiency virus infection) (HCC) 01/29/2016  . CAD (coronary artery disease) 01/29/2016  . COPD (chronic obstructive pulmonary disease) (HCC) 01/29/2016  . Diabetes mellitus (HCC) 01/29/2016  . Leg weakness, bilateral 01/13/2016  . Chest pain 06/24/2015    Past Surgical History:  Procedure Laterality Date  . ANKLE ARTHROSCOPY    . IVC FILTER INSERTION    . KYPHOPLASTY N/A 12/21/2016   Procedure: KYPHOPLASTY;  Surgeon: Kennedy Bucker, MD;  Location: [facility] ORS;  Service: Orthopedics;  Laterality: N/A;  . PERIPHERAL VASCULAR CATHETERIZATION N/A 03/16/2016   Procedure: IVC Filter Insertion;  Surgeon: Renford Dills, MD;  Location: [facility] INVASIVE CV LAB;  Service: Cardiovascular;  Laterality: N/A;  . SINUS EXPLORATION    . WRIST ARTHROSCOPY Right     Prior to Admission medications   Medication Sig  Start Date End Date Taking? Authorizing Provider  abacavir-dolutegravir-lamiVUDine (TRIUMEQ) 600-50-300 MG tablet Take 1 tablet by mouth daily after breakfast. Reported on 01/30/2016 01/27/16   [provider]  albuterol (PROVENTIL HFA;VENTOLIN HFA) 108 (90 BASE) MCG/ACT inhaler Inhale 2 puffs into the lungs every 6 (six) hours as needed for wheezing or shortness of breath.    [provider]  albuterol (PROVENTIL) (2.5 MG/3ML) 0.083% nebulizer solution Take 2.5 mg by nebulization 3 (three) times daily.     [provider]   atorvastatin (LIPITOR) 10 MG tablet Take 10 mg by mouth daily after breakfast. 11/16/16   [provider]  Calcium Carbonate-Vitamin D (CALCIUM 600+D) 600-400 MG-UNIT tablet Take 1 tablet by mouth daily after breakfast.     [provider]  carvedilol (COREG) 12.5 MG tablet Take 12.5 mg by mouth 2 (two) times daily with a meal.    [provider]  cetirizine (ZYRTEC) 10 MG tablet Take 10 mg by mouth daily as needed for allergies.     [provider]  enoxaparin (LOVENOX) 80 MG/0.8ML injection Inject 0.8 mLs (80 mg total) into the skin every 12 (twelve) hours. 02/27/17 05/02/17  Houston Siren, MD  EPINEPHrine (EPIPEN 2-PAK) 0.3 mg/0.3 mL IJ SOAJ injection Inject 0.3 mg into the muscle once as needed (for severe allergic reaction).    [provider]  escitalopram (LEXAPRO) 20 MG tablet Take 20 mg by mouth daily after breakfast.     [provider]  furosemide (LASIX) 20 MG tablet Take 20 mg by mouth daily.  10/22/16   [provider]  HYDROcodone-acetaminophen (NORCO) 5-325 MG tablet Take 1 tablet by mouth every 4 (four) hours as needed for moderate pain. 02/27/17   Houston Siren, MD  ketorolac (ACULAR) 0.5 % ophthalmic solution Place 1 drop into the left eye 4 (four) times daily. 02/03/17   [provider]  LINZESS 72 MCG capsule Take 72 mcg by mouth daily after breakfast. 11/10/16   [provider]  magnesium oxide (MAG-OX) 400 MG tablet Take 400 mg by mouth daily after breakfast.    [provider]  meloxicam (MOBIC) 7.5 MG tablet Take 7.5 mg by mouth daily as needed for pain.     [provider]  mirtazapine (REMERON) 15 MG tablet Take 15 mg by mouth every evening.     [provider]  mometasone-formoterol (DULERA) 200-5 MCG/ACT AERO Inhale 2 puffs into the lungs 3 (three) times daily.     [provider]  Multiple Vitamin (MULTIVITAMIN WITH MINERALS) TABS tablet Take 1 tablet by  mouth daily. ONE-A-DAY MULTIVITAMIN 50+    [provider]  nitroGLYCERIN (NITROSTAT) 0.4 MG SL tablet Place 0.4 mg under the tongue every 5 (five) minutes as needed for chest pain.    [provider]  ofloxacin (OCUFLOX) 0.3 % ophthalmic solution Place 1 drop into the left eye 4 (four) times daily. 01/31/17   [provider]  omeprazole (PRILOSEC) 40 MG capsule Take 40 mg by mouth daily after breakfast. 11/16/16   [provider]  Potassium Chloride CR (MICRO-K) 8 MEQ CPCR capsule CR Take 1 capsule by mouth daily after breakfast. 11/15/16   [provider]  senna-docusate (SENOKOT-S) 8.6-50 MG tablet Take 1 tablet by mouth at bedtime as needed for mild constipation. Patient not taking: Reported on 11/26/2016 01/31/16   Katharina Caper, MD  tiotropium (SPIRIVA) 18 MCG inhalation capsule Place 18 mcg into inhaler and inhale daily after breakfast.  [provider]  traMADol (ULTRAM) 50 MG tablet Take 1 tablet (50 mg total) by mouth every 6 (six) hours as needed. Patient taking differently: Take 50 mg by mouth 2 (two) times daily.  03/17/16   Katharina CaperVaickute, Rima, MD  zolpidem (AMBIEN) 5 MG tablet Take 5 mg by mouth at bedtime. 10/15/16   [provider]    Allergies Aspirin; Bee venom; Penicillins; Prednisone; Sulfa antibiotics; Sulfasalazine; Theophyllines; and Theophylline  Family History  Problem Relation Age of Onset  . CAD Unknown     Social History Social History  Substance Use Topics  . Smoking status: Former Smoker    Quit date: 12/16/2001  . Smokeless tobacco: Never Used  . Alcohol use No    Review of Systems Constitutional: No fever/chills Eyes: No visual changes. ENT: No sore throat. Cardiovascular: See history of present illness Respiratory: See history of present illness Gastrointestinal: No abdominal pain.  No nausea, no vomiting.  No diarrhea.  No constipation. Genitourinary: Negative for dysuria. Musculoskeletal:  Negative for back pain. Skin: Negative for rash. Neurological: Negative for headaches, focal weakness or numbness.    ____________________________________________   PHYSICAL EXAM:  VITAL SIGNS: ED Triage Vitals  Enc Vitals Group     BP 03/03/17 2020 (!) 180/110     Pulse Rate 03/03/17 2020 99     Resp 03/03/17 2020 (!) 21     Temp 03/03/17 2020 98.5 F (36.9 C)     Temp Source 03/03/17 2020 Oral     SpO2 03/03/17 2020 95 %     Weight 03/03/17 2020 185 lb (83.9 kg)     Height 03/03/17 2020 5\' 5"  (1.651 m)     Head Circumference --      Peak Flow --      Pain Score 03/03/17 2022 9     Pain Loc --      Pain Edu? --      Excl. in GC? --     Constitutional: Alert and oriented. Well appearing But does appear to have at least moderate chest discomfort. Eyes: Conjunctivae are normal. Head: Atraumatic. Nose: No congestion/rhinnorhea. Mouth/Throat: Mucous membranes are moist. Neck: No stridor.   Cardiovascular: Minimally tachycardic rate, regular rhythm. Grossly normal heart sounds.  Good peripheral circulation. Respiratory: Normal respiratory effort.  No retractions. Lungs CTAB. Gastrointestinal: Soft and nontender. No distention. Musculoskeletal: No lower extremity tenderness nor edema except for bruising from Lovenox injections, bandage over the left lower abdomen where he had some bleeding and an injection site that is now well controlled. Neurologic:  Normal speech and language. No gross focal neurologic deficits are appreciated.  Skin:  Skin is warm, dry and intact. No rash noted. Psychiatric: Mood and affect are normal. Speech and behavior are normal.  ____________________________________________   LABS (all labs ordered are listed, but only abnormal results are displayed)  Labs Reviewed  CBC - Abnormal; Notable for the following:       Result Value   RBC 4.04 (*)    HCT 39.6 (*)    All other components within normal limits  COMPREHENSIVE METABOLIC PANEL -  Abnormal; Notable for the following:    Glucose, Bld 113 (*)    Creatinine, Ser 1.49 (*)    AST 46 (*)    ALT 72 (*)    Alkaline Phosphatase 189 (*)    GFR calc non Af Amer 50 (*)    GFR calc Af Amer 58 (*)    All other components within normal limits  TROPONIN I  PROTIME-INR  APTT   ____________________________________________  EKG  Reviewed and interpreted by me at 2020 Ventricular rate 100 QRS 80 QTc 4:30 Normal sinus rhythm, nonspecific T-wave abnormalities versus artifact, no evidence of acute ischemia noted ____________________________________________  RADIOLOGY  Ct Angio Chest Pe W Or Wo Contrast  Result Date: 03/03/2017 CLINICAL DATA:  Severe chest pain history of multiple DVT EXAM: CT ANGIOGRAPHY CHEST WITH CONTRAST TECHNIQUE: Multidetector CT imaging of the chest was performed using the standard protocol during bolus administration of intravenous contrast. Multiplanar CT image reconstructions and MIPs were obtained to evaluate the vascular anatomy. CONTRAST:  75 mL Isovue 370 intravenous COMPARISON:  02/25/2017, ultrasound 02/25/2017, CT chest 11/26/2016 FINDINGS: Cardiovascular: Satisfactory opacification of the pulmonary arteries to the segmental level. No evidence of pulmonary embolism. Linear web like defect within left lower lobe segmental arterial branches unchanged. Non aneurysmal aorta. Common origin of brachiocephalic and left common carotid vessels. No aneurysmal dilatation. Coronary artery calcification. Normal heart size. No pericardial effusion. Mediastinum/Nodes: No enlarged mediastinal, hilar, or axillary lymph nodes. Thyroid gland, trachea, and esophagus demonstrate no significant findings. Lungs/Pleura: Lungs are clear. No pleural effusion or pneumothorax. Upper Abdomen: No acute abnormality. Musculoskeletal: Post augmentation changes at T6. Partial anterior fusion at T4-T5. Chronic superior endplate deformity at T8. Old right rib fractures. Review of the MIP  images confirms the above findings. IMPRESSION: 1. No acute pulmonary embolus, aneurysm or dissection is seen 2. Clear lung fields Electronically Signed   By: Jasmine PangKim  Fujinaga M.D.   On: 03/03/2017 21:37    ____________________________________________   PROCEDURES  Procedure(s) performed: None  Procedures  Critical Care performed: No  ____________________________________________   INITIAL IMPRESSION / ASSESSMENT AND PLAN / ED COURSE  Pertinent labs & imaging results that were available during my care of the patient were reviewed by me and considered in my medical decision making (see chart for details).  Chest pain. Recent diagnosis of DVT on anticoagulation. Given the associated chest pain will evaluate for pulmonary embolism or dissection via CT scan.  Moderate risk for chest pain with multiple risk factors including previous smoker. Presentation does appear likely atypical. Nonspecific T-wave abnormality noted.  ----------------------------------------- 11:46 PM on 03/03/2017 -----------------------------------------  Spoke with Dr. Park BreedKahn. Advised patient was seen by him, would be appropriate for either discharge or observation for chest discomfort overnight. I discussed with the patient, patient would like to be admitted. Does report at least moderate ongoing chest discomfort at this time. We'll admit for further workup and chest pain observation.      ____________________________________________   FINAL CLINICAL IMPRESSION(S) / ED DIAGNOSES  Final diagnoses:  Chest pain  Moderate risk chest pain      NEW MEDICATIONS STARTED DURING THIS VISIT:  New Prescriptions   No medications on file     Note:  This document was prepared using Dragon voice recognition software and may include unintentional dictation errors.     Sharyn CreamerQuale, Mark, MD 03/03/17 [phone]

## 2017-03-03 NOTE — ED Notes (Signed)
Pt requesting family to bedside. No family has checked into lobby to this point.

## 2017-03-03 NOTE — ED Triage Notes (Signed)
Pt presents to ED reporting chest pain 9/10 that began this afternoon. Pt was in hospital for multiple DVTs in the right leg last week. Pt reprots taking Lovenox shots in abd. Pt had bleeding from bleeding from belly at sight of injections today.  where pt takes lovenox shots.   1 Nitro SL 1 hour prior to EMS arrival  EMS administered 1 Nitro spray. No changes in pain.  Can NOT take aspririn per pt due to allergy.

## 2017-03-04 ENCOUNTER — Encounter: Payer: Self-pay | Admitting: Internal Medicine

## 2017-03-04 ENCOUNTER — Observation Stay: Payer: Medicare Other

## 2017-03-04 DIAGNOSIS — R188 Other ascites: Secondary | ICD-10-CM | POA: Diagnosis not present

## 2017-03-04 DIAGNOSIS — R0789 Other chest pain: Secondary | ICD-10-CM | POA: Diagnosis not present

## 2017-03-04 DIAGNOSIS — R079 Chest pain, unspecified: Secondary | ICD-10-CM | POA: Diagnosis not present

## 2017-03-04 DIAGNOSIS — I200 Unstable angina: Secondary | ICD-10-CM | POA: Diagnosis not present

## 2017-03-04 DIAGNOSIS — R112 Nausea with vomiting, unspecified: Secondary | ICD-10-CM | POA: Diagnosis not present

## 2017-03-04 DIAGNOSIS — R109 Unspecified abdominal pain: Secondary | ICD-10-CM | POA: Diagnosis not present

## 2017-03-04 DIAGNOSIS — I2510 Atherosclerotic heart disease of native coronary artery without angina pectoris: Secondary | ICD-10-CM | POA: Diagnosis not present

## 2017-03-04 DIAGNOSIS — I10 Essential (primary) hypertension: Secondary | ICD-10-CM | POA: Diagnosis not present

## 2017-03-04 LAB — TROPONIN I
Troponin I: <0.03 ng/mL (ref <0.03)
Troponin I: <0.03 ng/mL (ref <0.03)
Troponin I: <0.03 ng/mL (ref <0.03)

## 2017-03-04 LAB — PROTHROMBIN GENE MUTATION

## 2017-03-04 LAB — TSH: TSH: 16.131 u[IU]/mL — ABNORMAL HIGH (ref 0.350–4.500)

## 2017-03-04 MED ORDER — MOMETASONE FURO-FORMOTEROL FUM 200-5 MCG/ACT IN AERO
2.0000 | INHALATION_SPRAY | Freq: Three times a day (TID) | RESPIRATORY_TRACT | Status: DC
  Administered 2017-03-04 – 2017-03-05 (×4): 2 via RESPIRATORY_TRACT
  Filled 2017-03-04: qty 8.8

## 2017-03-04 MED ORDER — MAGNESIUM OXIDE 400 (241.3 MG) MG PO TABS
400.0000 mg | ORAL_TABLET | Freq: Every day | ORAL | Status: DC
  Administered 2017-03-04 – 2017-03-05 (×2): 400 mg via ORAL
  Filled 2017-03-04 (×3): qty 1

## 2017-03-04 MED ORDER — ALBUTEROL SULFATE (2.5 MG/3ML) 0.083% IN NEBU
2.5000 mg | INHALATION_SOLUTION | Freq: Three times a day (TID) | RESPIRATORY_TRACT | Status: DC
  Administered 2017-03-04 – 2017-03-05 (×5): 2.5 mg via RESPIRATORY_TRACT
  Filled 2017-03-04 (×5): qty 3

## 2017-03-04 MED ORDER — NITROGLYCERIN 2 % TD OINT: 0.5000 [in_us] | TOPICAL_OINTMENT | TRANSDERMAL | Status: AC

## 2017-03-04 MED ORDER — PANTOPRAZOLE SODIUM 40 MG PO TBEC
40.0000 mg | DELAYED_RELEASE_TABLET | Freq: Every day | ORAL | Status: DC
  Administered 2017-03-04 – 2017-03-05 (×2): 40 mg via ORAL
  Filled 2017-03-04 (×2): qty 1

## 2017-03-04 MED ORDER — CALCIUM CARBONATE-VITAMIN D 500-200 MG-UNIT PO TABS
1.0000 | ORAL_TABLET | Freq: Every day | ORAL | Status: DC
  Administered 2017-03-04 – 2017-03-05 (×2): 1 via ORAL
  Filled 2017-03-04 (×3): qty 1

## 2017-03-04 MED ORDER — NITROGLYCERIN 0.4 MG SL SUBL: 0.4000 mg | SUBLINGUAL_TABLET | SUBLINGUAL | Status: DC | PRN

## 2017-03-04 MED ORDER — ZOLPIDEM TARTRATE 5 MG PO TABS
5.0000 mg | ORAL_TABLET | Freq: Every day | ORAL | Status: DC
  Administered 2017-03-04: 5 mg via ORAL
  Filled 2017-03-04: qty 1

## 2017-03-04 MED ORDER — OFLOXACIN 0.3 % OP SOLN
1.0000 [drp] | Freq: Four times a day (QID) | OPHTHALMIC | Status: DC
  Administered 2017-03-04 – 2017-03-05 (×4): 1 [drp] via OPHTHALMIC
  Filled 2017-03-04: qty 5

## 2017-03-04 MED ORDER — ABACAVIR-DOLUTEGRAVIR-LAMIVUD 600-50-300 MG PO TABS
1.0000 | ORAL_TABLET | Freq: Every day | ORAL | Status: DC
  Administered 2017-03-04 – 2017-03-05 (×2): 1 via ORAL
  Filled 2017-03-04 (×2): qty 1

## 2017-03-04 MED ORDER — SODIUM CHLORIDE 0.9 % IV SOLN
INTRAVENOUS | Status: DC
  Administered 2017-03-04 – 2017-03-05 (×4): via INTRAVENOUS

## 2017-03-04 MED ORDER — TRAMADOL HCL 50 MG PO TABS: 50.0000 mg | ORAL_TABLET | Freq: Four times a day (QID) | ORAL | Status: DC | PRN

## 2017-03-04 MED ORDER — SENNOSIDES-DOCUSATE SODIUM 8.6-50 MG PO TABS: 1.0000 | ORAL_TABLET | Freq: Every evening | ORAL | Status: DC | PRN

## 2017-03-04 MED ORDER — ESCITALOPRAM OXALATE 20 MG PO TABS
20.0000 mg | ORAL_TABLET | Freq: Every day | ORAL | Status: DC
  Administered 2017-03-04 – 2017-03-05 (×2): 20 mg via ORAL
  Filled 2017-03-04 (×2): qty 1

## 2017-03-04 MED ORDER — HYDROCODONE-ACETAMINOPHEN 5-325 MG PO TABS
1.0000 | ORAL_TABLET | ORAL | Status: DC | PRN
  Administered 2017-03-04 – 2017-03-05 (×5): 1 via ORAL
  Filled 2017-03-04 (×5): qty 1

## 2017-03-04 MED ORDER — TIOTROPIUM BROMIDE MONOHYDRATE 18 MCG IN CAPS
18.0000 ug | ORAL_CAPSULE | Freq: Every day | RESPIRATORY_TRACT | Status: DC
  Administered 2017-03-04 – 2017-03-05 (×2): 18 ug via RESPIRATORY_TRACT
  Filled 2017-03-04: qty 5

## 2017-03-04 MED ORDER — POTASSIUM CHLORIDE CRYS ER 10 MEQ PO TBCR
10.0000 meq | EXTENDED_RELEASE_TABLET | Freq: Every day | ORAL | Status: DC
  Administered 2017-03-04 – 2017-03-05 (×2): 10 meq via ORAL
  Filled 2017-03-04 (×2): qty 1

## 2017-03-04 MED ORDER — ENOXAPARIN SODIUM 80 MG/0.8ML ~~LOC~~ SOLN
80.0000 mg | Freq: Two times a day (BID) | SUBCUTANEOUS | Status: DC
  Administered 2017-03-04 – 2017-03-05 (×3): 80 mg via SUBCUTANEOUS
  Filled 2017-03-04 (×3): qty 0.8

## 2017-03-04 MED ORDER — ONDANSETRON HCL 4 MG PO TABS: 4.0000 mg | ORAL_TABLET | Freq: Four times a day (QID) | ORAL | Status: DC | PRN

## 2017-03-04 MED ORDER — LEVOTHYROXINE SODIUM 50 MCG PO TABS
50.0000 ug | ORAL_TABLET | Freq: Every day | ORAL | Status: DC
  Administered 2017-03-04 – 2017-03-05 (×2): 50 ug via ORAL
  Filled 2017-03-04 (×2): qty 1

## 2017-03-04 MED ORDER — KETOROLAC TROMETHAMINE 0.5 % OP SOLN
1.0000 [drp] | Freq: Four times a day (QID) | OPHTHALMIC | Status: DC
  Administered 2017-03-04 – 2017-03-05 (×4): 1 [drp] via OPHTHALMIC
  Filled 2017-03-04: qty 3

## 2017-03-04 MED ORDER — ATORVASTATIN CALCIUM 10 MG PO TABS
10.0000 mg | ORAL_TABLET | Freq: Every day | ORAL | Status: DC
  Administered 2017-03-04 – 2017-03-05 (×2): 10 mg via ORAL
  Filled 2017-03-04 (×3): qty 1

## 2017-03-04 MED ORDER — FUROSEMIDE 20 MG PO TABS
20.0000 mg | ORAL_TABLET | Freq: Every day | ORAL | Status: DC
  Administered 2017-03-04 – 2017-03-05 (×2): 20 mg via ORAL
  Filled 2017-03-04 (×2): qty 1

## 2017-03-04 MED ORDER — ONDANSETRON HCL 4 MG/2ML IJ SOLN: 4.0000 mg | Freq: Four times a day (QID) | INTRAMUSCULAR | Status: DC | PRN

## 2017-03-04 MED ORDER — IOPAMIDOL (ISOVUE-300) INJECTION 61%
15.0000 mL | INTRAVENOUS | Status: AC
  Administered 2017-03-04 (×2): 15 mL via ORAL

## 2017-03-04 MED ORDER — ACETAMINOPHEN 325 MG PO TABS
650.0000 mg | ORAL_TABLET | Freq: Four times a day (QID) | ORAL | Status: DC | PRN
  Administered 2017-03-04: 650 mg via ORAL
  Filled 2017-03-04: qty 2

## 2017-03-04 MED ORDER — ALBUTEROL SULFATE (2.5 MG/3ML) 0.083% IN NEBU: 2.5000 mg | INHALATION_SOLUTION | Freq: Three times a day (TID) | RESPIRATORY_TRACT | Status: DC

## 2017-03-04 MED ORDER — ADULT MULTIVITAMIN W/MINERALS CH
1.0000 | ORAL_TABLET | Freq: Every day | ORAL | Status: DC
  Administered 2017-03-04 – 2017-03-05 (×2): 1 via ORAL
  Filled 2017-03-04 (×2): qty 1

## 2017-03-04 MED ORDER — MIRTAZAPINE 15 MG PO TABS
15.0000 mg | ORAL_TABLET | Freq: Every evening | ORAL | Status: DC
  Administered 2017-03-04: 15 mg via ORAL
  Filled 2017-03-04: qty 1

## 2017-03-04 MED ORDER — SODIUM CHLORIDE 0.9% FLUSH
3.0000 mL | Freq: Two times a day (BID) | INTRAVENOUS | Status: DC
  Administered 2017-03-04 – 2017-03-05 (×2): 3 mL via INTRAVENOUS

## 2017-03-04 MED ORDER — LORATADINE 10 MG PO TABS
10.0000 mg | ORAL_TABLET | Freq: Every day | ORAL | Status: DC
  Administered 2017-03-04 – 2017-03-05 (×2): 10 mg via ORAL
  Filled 2017-03-04 (×2): qty 1

## 2017-03-04 MED ORDER — EPINEPHRINE 0.3 MG/0.3ML IJ SOAJ
0.3000 mg | Freq: Once | INTRAMUSCULAR | Status: DC | PRN
  Filled 2017-03-04: qty 0.3

## 2017-03-04 MED ORDER — ACETAMINOPHEN 650 MG RE SUPP: 650.0000 mg | Freq: Four times a day (QID) | RECTAL | Status: DC | PRN

## 2017-03-04 MED ORDER — LINACLOTIDE 72 MCG PO CAPS
72.0000 ug | ORAL_CAPSULE | Freq: Every day | ORAL | Status: DC
  Administered 2017-03-04 – 2017-03-05 (×2): 72 ug via ORAL
  Filled 2017-03-04 (×2): qty 1

## 2017-03-04 MED ORDER — CARVEDILOL 12.5 MG PO TABS
12.5000 mg | ORAL_TABLET | Freq: Two times a day (BID) | ORAL | Status: DC
  Administered 2017-03-04 – 2017-03-05 (×3): 12.5 mg via ORAL
  Filled 2017-03-04 (×3): qty 1

## 2017-03-04 NOTE — ED Notes (Signed)
NAD noted at this time. Pt continues to rest in bed. C/o worsening pain to either side of his abdomen, worse with palpation. Pt resting in bed comfortably. Asking again when he will be admitted. This RN once again explained delay. Explained to patient the delay, pt states understanding. Will continue to monitor for further patient needs.

## 2017-03-04 NOTE — Progress Notes (Signed)
[employer] - [place] at [facility]                                                                                                                                                                                  Patient Demographics   Brian Hampton, is a 61 y.o. male, DOB - 1956/06/03, WUJ:[id number]  Admit date - 03/03/2017   Admitting Physician Arnaldo Natal, MD  Outpatient Primary MD for the patient is Mick Sell, MD   LOS - 0  Subjective: Patient was just recently hospitalized with DVT, now presents with chest pain, CT of the chest negative for PE  Cardiac enzymes negative, complains of abdomen distended    Review of Systems:   CONSTITUTIONAL: No documented fever. No fatigue, weakness. No weight gain, no weight loss.  EYES: No blurry or double vision.  ENT: No tinnitus. No postnasal drip. No redness of the oropharynx.  RESPIRATORY: No cough, no wheeze, no hemoptysis. No dyspnea.  CARDIOVASCULAR: Positive chest pain. No orthopnea. No palpitations. No syncope.  GASTROINTESTINAL: No nausea, no vomiting or diarrhea. Positive abdominal pain. No melena or hematochezia.  GENITOURINARY: No dysuria or hematuria.  ENDOCRINE: No polyuria or nocturia. No heat or cold intolerance.  HEMATOLOGY: No anemia. No bruising. No bleeding.  INTEGUMENTARY: No rashes. No lesions.  MUSCULOSKELETAL: No arthritis. No swelling. No gout.  NEUROLOGIC: No numbness, tingling, or ataxia. No seizure-type activity.  PSYCHIATRIC: No anxiety. No insomnia. No ADD.    Vitals:   Vitals:   03/04/17 0738 03/04/17 0856 03/04/17 1200 03/04/17 1417  BP:  (!) 146/93 (!) 159/91   Pulse:  80 78   Resp:  16 18   Temp:  97.7 F (36.5 C) 98.2 F (36.8 C)   TempSrc:  Oral Oral   SpO2: 97% 95% 99% 99%  Weight:      Height:        Wt Readings from Last 3 Encounters:  03/04/17 184 lb 4.8 oz (83.6 kg)  02/25/17 185 lb (83.9 kg)  12/21/16 185 lb (83.9 kg)     Intake/Output Summary (Last  24 hours) at 03/04/17 1439 Last data filed at 03/04/17 1030  Gross per 24 hour  Intake              360 ml  Output                0 ml  Net              360 ml    Physical Exam:   GENERAL: Pleasant-appearing in no apparent distress.  HEAD, EYES, EARS, NOSE AND THROAT: Atraumatic, normocephalic. Extraocular muscles are intact. Pupils equal and  reactive to light. Sclerae anicteric. No conjunctival injection. No oro-pharyngeal erythema.  NECK: Supple. There is no jugular venous distention. No bruits, no lymphadenopathy, no thyromegaly.  HEART: Regular rate and rhythm,. No murmurs, no rubs, no clicks.  LUNGS: Clear to auscultation bilaterally. No rales or rhonchi. No wheezes.  ABDOMEN: Soft, flat, nontender, distended. Has good bowel sounds. No hepatosplenomegaly appreciated.  EXTREMITIES: No evidence of any cyanosis, clubbing, or peripheral edema.  +2 pedal and radial pulses bilaterally.  NEUROLOGIC: The patient is alert, awake, and oriented x3 with no focal motor or sensory deficits appreciated bilaterally.  SKIN: Moist and warm with no rashes appreciated.  Psych: Not anxious, depressed LN: No inguinal LN enlargement    Antibiotics   Anti-infectives    Start     Dose/Rate Route Frequency Ordered Stop   03/04/17 0900  abacavir-dolutegravir-lamiVUDine (TRIUMEQ) 600-50-300 MG per tablet 1 tablet    Comments:  Reported on 01/30/2016     1 tablet Oral Daily after breakfast 03/04/17 0405        Medications   Scheduled Meds: . abacavir-dolutegravir-lamiVUDine  1 tablet Oral QPC breakfast  . albuterol  2.5 mg Nebulization TID  . atorvastatin  10 mg Oral QPC breakfast  . calcium-vitamin D  1 tablet Oral QPC breakfast  . carvedilol  12.5 mg Oral BID WC  . enoxaparin  80 mg Subcutaneous Q12H  . escitalopram  20 mg Oral QPC breakfast  . furosemide  20 mg Oral Daily  . iopamidol  15 mL Oral Q1 Hr x 2  . ketorolac  1 drop Left Eye QID  . levothyroxine  50 mcg Oral QAC breakfast  .  linaclotide  72 mcg Oral QPC breakfast  . loratadine  10 mg Oral Daily  . magnesium oxide  400 mg Oral QPC breakfast  . mirtazapine  15 mg Oral QPM  . mometasone-formoterol  2 puff Inhalation TID  . multivitamin with minerals  1 tablet Oral Daily  . nitroGLYCERIN  0.5 inch Topical STAT  . ofloxacin  1 drop Left Eye QID  . pantoprazole  40 mg Oral Daily  . potassium chloride  10 mEq Oral Daily  . tiotropium  18 mcg Inhalation QPC breakfast  . zolpidem  5 mg Oral QHS   Continuous Infusions: . sodium chloride 125 mL/hr at 03/04/17 1218   PRN Meds:.acetaminophen **OR** acetaminophen, EPINEPHrine, HYDROcodone-acetaminophen, nitroGLYCERIN, ondansetron **OR** ondansetron (ZOFRAN) IV, senna-docusate, traMADol   Data Review:   Micro Results No results found for this or any previous visit (from the past 240 hour(s)).  Radiology Reports Ct Angio Chest Pe W Or Wo Contrast  Result Date: 03/03/2017 CLINICAL DATA:  Severe chest pain history of multiple DVT EXAM: CT ANGIOGRAPHY CHEST WITH CONTRAST TECHNIQUE: Multidetector CT imaging of the chest was performed using the standard protocol during bolus administration of intravenous contrast. Multiplanar CT image reconstructions and MIPs were obtained to evaluate the vascular anatomy. CONTRAST:  75 mL Isovue 370 intravenous COMPARISON:  02/25/2017, ultrasound 02/25/2017, CT chest 11/26/2016 FINDINGS: Cardiovascular: Satisfactory opacification of the pulmonary arteries to the segmental level. No evidence of pulmonary embolism. Linear web like defect within left lower lobe segmental arterial branches unchanged. Non aneurysmal aorta. Common origin of brachiocephalic and left common carotid vessels. No aneurysmal dilatation. Coronary artery calcification. Normal heart size. No pericardial effusion. Mediastinum/Nodes: No enlarged mediastinal, hilar, or axillary lymph nodes. Thyroid gland, trachea, and esophagus demonstrate no significant findings. Lungs/Pleura:  Lungs are clear. No pleural effusion or pneumothorax. Upper Abdomen: No acute  abnormality. Musculoskeletal: Post augmentation changes at T6. Partial anterior fusion at T4-T5. Chronic superior endplate deformity at T8. Old right rib fractures. Review of the MIP images confirms the above findings. IMPRESSION: 1. No acute pulmonary embolus, aneurysm or dissection is seen 2. Clear lung fields Electronically Signed   By: Jasmine PangKim  Fujinaga M.D.   On: 03/03/2017 21:37   Ct Angio Chest Pe W And/or Wo Contrast  Result Date: 02/25/2017 CLINICAL DATA:  Right leg DVT.  Chest pain and dyspnea x1 week. EXAM: CT ANGIOGRAPHY CHEST WITH CONTRAST TECHNIQUE: Multidetector CT imaging of the chest was performed using the standard protocol during bolus administration of intravenous contrast. Multiplanar CT image reconstructions and MIPs were obtained to evaluate the vascular anatomy. CONTRAST:  75 cc Isovue 370 IV COMPARISON:  11/26/2016 CT FINDINGS: Cardiovascular: Satisfactory opacification of the pulmonary arteries to the subsegmental level. No evidence of pulmonary embolism. Normal heart size. Coronary arterial calcifications along the LAD. No pericardial effusion. No aortic aneurysm or dissection. Two vessel takeoff the aortic arch with common origin of the right brachiocephalic and left common carotid arteries. Mediastinum/Nodes: No enlarged mediastinal, hilar, or axillary lymph nodes. Thyroid gland, trachea, and esophagus demonstrate no significant findings. Lungs/Pleura: Lungs demonstrate dependent atelectasis bilaterally. Tiny subpleural bleb in the right upper lobe with a few scattered bowel tiny nonspecific subpleural densities possibly representing chronic postinfectious or postinflammatory change or atelectasis. No pleural effusion or pneumothorax. Upper Abdomen: No acute abnormality.  Tip of an IVC filter is seen. Musculoskeletal: No chest wall abnormality. No acute or significant osseous findings. Old left-sided healed rib  fractures. Old T6 fracture with augmentation and chronic moderate T8 compression fractures. T12-L1 degenerative disc disease with vacuum disc. Review of the MIP images confirms the above findings. IMPRESSION: 1. No acute pulmonary embolus or aortic aneurysm.  No dissection. 2. Coronary arteriosclerosis. 3. No active cardiopulmonary disease. Electronically Signed   By: Tollie Ethavid  Kwon M.D.   On: 02/25/2017 20:57   [place] Venous Img Lower Unilateral Right  Result Date: 02/25/2017 CLINICAL DATA:  Initial evaluation for acute leg pain. EXAM: Right LOWER EXTREMITY VENOUS DOPPLER ULTRASOUND TECHNIQUE: Gray-scale sonography with graded compression, as well as color Doppler and duplex ultrasound were performed to evaluate the lower extremity deep venous systems from the level of the common femoral vein and including the common femoral, femoral, profunda femoral, popliteal and calf veins including the posterior tibial, peroneal and gastrocnemius veins when visible. The superficial great saphenous vein was also interrogated. Spectral Doppler was utilized to evaluate flow at rest and with distal augmentation maneuvers in the common femoral, femoral and popliteal veins. COMPARISON:  Prior ultrasound from 01/13/2006. FINDINGS: Contralateral Common Femoral Vein: Respiratory phasicity is normal and symmetric with the symptomatic side. No evidence of thrombus. Normal compressibility. Common Femoral Vein: No evidence of thrombus. Normal compressibility, respiratory phasicity and response to augmentation. Saphenofemoral Junction: No evidence of thrombus. Normal compressibility and flow on color Doppler imaging. Profunda Femoral Vein: No evidence of thrombus. Normal compressibility and flow on color Doppler imaging. Femoral Vein: The femoral vein/SFV is duplicated. Occlusive thrombus within 1 set of the right femoral vein beginning at the proximal mid thigh. Loss of normal compressibility. Popliteal Vein: Occlusive thrombus present  throughout the popliteal vein with loss of normal compressibility. Calf Veins: Occlusive thrombus within the right posterior tibial and peroneal veins with loss of normal compressibility. Superficial Great Saphenous Vein: No evidence of thrombus. Normal compressibility and flow on color Doppler imaging. IMPRESSION: Positive study for DVT with occlusive thrombus  involving the right femoral and popliteal veins as well as the veins of the calf, extending from the proximal- mid right thigh through to the ankle. Critical Value/emergent results were called by telephone at the time of interpretation on 02/25/2017 at 6:14 pm to Dr. Ileana Roup , who verbally acknowledged these results. Electronically Signed   By: Rise Mu M.D.   On: 02/25/2017 18:18     CBC  Recent Labs Lab 02/25/17 1603 02/26/17 0510 02/26/17 2047 02/27/17 0342 03/03/17 2024  WBC 9.0 7.2  --  6.6 9.1  HGB 13.6 12.6* 12.9* 12.1* 13.5  HCT 40.3 36.8*  --  35.3* 39.6*  PLT 232 200  --  201 293  MCV 97.7 98.5  --  97.0 98.1  MCH 33.0 33.7  --  33.3 33.5  MCHC 33.8 34.2  --  34.3 34.2  RDW 14.2 14.1  --  14.4 14.5    Chemistries   Recent Labs Lab 02/25/17 1603 02/26/17 0510 03/03/17 2024  NA 139 145 141  K 3.5 3.6 3.6  CL 108 108 106  CO2 26 29 27   GLUCOSE 197* 125* 113*  BUN 12 11 17   CREATININE 1.41* 1.43* 1.49*  CALCIUM 8.3* 8.6* 9.3  AST  --   --  46*  ALT  --   --  72*  ALKPHOS  --   --  189*  BILITOT  --   --  0.6   ------------------------------------------------------------------------------------------------------------------ estimated creatinine clearance is 53.1 mL/min (A) (by C-G formula based on SCr of 1.49 mg/dL (H)). ------------------------------------------------------------------------------------------------------------------ No results for input(s): HGBA1C in the last 72  hours. ------------------------------------------------------------------------------------------------------------------ No results for input(s): CHOL, HDL, LDLCALC, TRIG, CHOLHDL, LDLDIRECT in the last 72 hours. ------------------------------------------------------------------------------------------------------------------  Recent Labs  03/04/17 0414  TSH 16.131*   ------------------------------------------------------------------------------------------------------------------ No results for input(s): VITAMINB12, FOLATE, FERRITIN, TIBC, IRON, RETICCTPCT in the last 72 hours.  Coagulation profile  Recent Labs Lab 02/25/17 1603 03/03/17 2024  INR 3.04 1.07    No results for input(s): DDIMER in the last 72 hours.  Cardiac Enzymes  Recent Labs Lab 03/03/17 2024 03/04/17 0414 03/04/17 1021  TROPONINI <0.03 <0.03 <0.03   ------------------------------------------------------------------------------------------------------------------ Invalid input(s): POCBNP    Assessment & Plan   This is a 61 year old male admitted for chest pain. 1. Chest pain: Atypical for cardiac pain. Seen by cardiology they feel is atypical no further cardiac workup recommended 2. Abdominal pain at this point I will obtain a CT of the abdomen to further evaluate 3. Ascites: Distention due to increasing abdominal ascites contributing to pain. Hep panel pending  4. Hypertension: Uncontrolled; nitro placed on chest. Continue Coreg. Labetalol as needed 5. COPD: Continue inhaled corticosteroid as well as Spiriva. Albuterol as needed. 6. HIV: Continue triple therapy 7. DVT: Acute and recurrent. Continue Lovenox indefinitely 8. Hyperlipidemia: Continue statin therapy 9. Depression: Continue Remeron and Lexapro 10. Hypothyroidism:  Continue level thyroxine 11. DVT prophylaxis: Full dose anticoagulation 12. GI prophylaxis: Pantoprazole per home regimen The patient is a full code. Time spent on  admission orders and patient care approximately 45 minutes  Arnaldo Natal, MD     Code Status Orders        Start     Ordered   03/04/17 0406  Full code  Continuous     03/04/17 0405    Code Status History    Date Active Date Inactive Code Status Order ID Comments User Context   02/25/2017 11:52 PM 02/27/2017  6:06 PM DNR [id number]  Hugelmeyer, Alexis, DO Inpatient   02/25/2017 10:27 PM 02/25/2017 11:52 PM Full Code [id number]  Tonye RoyaltyHugelmeyer, Alexis, DO Inpatient   12/21/2016  6:14 PM 12/24/2016  3:03 PM DNR [id number]  Kennedy BuckerMenz, Michael, MD Inpatient   05/05/2016 10:17 PM 05/12/2016  6:20 PM DNR [id number]  Altamese DillingVachhani, Vaibhavkumar, MD Inpatient   03/15/2016  5:42 PM 03/17/2016  6:59 PM DNR [id number]  Auburn BilberryPatel, Terrye Dombrosky, MD ED   03/15/2016  5:29 PM 03/15/2016  5:42 PM Full Code [id number]  Auburn BilberryPatel, Jaliana Medellin, MD ED   01/30/2016 12:55 AM 02/03/2016 12:55 AM DNR [id number]  Gery Prayrosley, Debby, MD Inpatient   01/13/2016  9:46 PM 01/16/2016  4:18 PM Full Code [id number]  Shaune Pollackhen, Qing, MD Inpatient   06/24/2015  7:13 AM 06/25/2015 12:27 PM Full Code [id number]  Milagros LollSudini, Srikar, MD ED    Advance Directive Documentation     Most Recent Value  Type of Advance Directive  Living will  Pre-existing out of facility DNR order (yellow form or pink MOST form)  -  "MOST" Form in Place?  -           Consults Cardiology  DVT Prophylaxis  Lovenox  Lab Results  Component Value Date   PLT 293 03/03/2017     Time Spent in minutes   35min  Greater than 50% of time spent in care coordination and counseling patient regarding the condition and plan of care.   Auburn BilberryPATEL, Dewitt Judice M.D on 03/04/2017 at 2:39 PM  Between 7am to 6pm - Pager - [phone]  After 6pm go to [email or web address] - password [email or web address] [facility]  [facility]   Office  [phone]

## 2017-03-04 NOTE — ED Notes (Signed)
Pt given a urinal per his request. Visualized in NAD. Explained still waiting for admitting MD. Pt states understanding. Pt given ginger ale per his request. Will continue to monitor for further patient needs.

## 2017-03-04 NOTE — ED Notes (Signed)
This RN to bedside at this time. Pt denies any changes in pain with the nitro patch. This RN explained and apologized for delay in patient being admitted. Pt states understanding, states, "I'm just ready to go upstairs". This RN made sure call bell was within reach of patient and reiterated proper call bell use. Pt states understanding. Lights dimmed for patient comfort. Will continue to monitor for further patient needs.

## 2017-03-04 NOTE — Care Management (Signed)
Patient placed in observation for chest pain.  [facility] is [facility]  May need to add SW.

## 2017-03-04 NOTE — Consult Note (Signed)
Brian Hampton is a 61 y.o. 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 He was seen by me in the office and it was found that he is having DVT by ultrasound after being admitted on 02/26/2016 and was started on Lovenox because of being resistant to Coumadin with therapeutic INR still having DVT. He had a workup for thrombotic state. His right now having mostly abdominal pain and some chest pain.   Review of Systems: orthopnea PND or leg swelling   Past Medical History:  Diagnosis Date  . Anemia   . Asthma   . Cataracts, bilateral    worse in Rt eye  . Collagen vascular disease (HCC)   . COPD (chronic obstructive pulmonary disease) (HCC)   . Coronary artery disease   . Depression   . DVT (deep venous thrombosis) (HCC)   . Dysrhythmia   . Emphysema/COPD (HCC)   . Environmental and seasonal allergies   . H/O blood clots   . Heart murmur   . HIV (human immunodeficiency virus infection) (HCC)   . Hypertension   . Leaky heart valve    x 3  . Lung mass   . Myocardial infarction (HCC)    in 2000  . Pneumonia    year ago  . Pulmonary emboli (HCC)   . Type 2 diabetes mellitus (HCC)     Medications Prior to Admission  Medication Sig Dispense Refill  . abacavir-dolutegravir-lamiVUDine (TRIUMEQ) 600-50-300 MG tablet Take 1 tablet by mouth daily after breakfast. Reported on 01/30/2016    . albuterol (PROVENTIL HFA;VENTOLIN HFA) 108 (90 BASE) MCG/ACT inhaler Inhale 2 puffs into the lungs every 6 (six) hours as needed for wheezing or shortness of breath.    Marland Kitchen. albuterol (PROVENTIL) (2.5 MG/3ML) 0.083% nebulizer solution Take 2.5 mg by nebulization 3 (three) times  daily.     Marland Kitchen. atorvastatin (LIPITOR) 10 MG tablet Take 10 mg by mouth daily after breakfast.    . Calcium Carbonate-Vitamin D (CALCIUM 600+D) 600-400 MG-UNIT tablet Take 1 tablet by mouth daily after breakfast.     . carvedilol (COREG) 12.5 MG tablet Take 12.5 mg by mouth 2 (two) times daily with a meal.    . cetirizine (ZYRTEC) 10 MG tablet Take 10 mg by mouth daily as needed for allergies.     Marland Kitchen. enoxaparin (LOVENOX) 80 MG/0.8ML injection Inject 0.8 mLs (80 mg total) into the skin every 12 (twelve) hours. 51 mL 1  . EPINEPHrine (EPIPEN 2-PAK) 0.3 mg/0.3 mL IJ SOAJ injection Inject 0.3 mg into the muscle once as needed (for severe allergic reaction).    Marland Kitchen. escitalopram (LEXAPRO) 20 MG tablet Take 20 mg by mouth daily after breakfast.     . furosemide (LASIX) 20 MG tablet Take 20 mg by mouth daily.     Marland Kitchen. HYDROcodone-acetaminophen (NORCO) 5-325 MG tablet Take 1 tablet by mouth every 4 (four) hours as needed for moderate pain. 15 tablet 0  . ketorolac (ACULAR) 0.5 % ophthalmic solution Place 1 drop into the left eye 4 (four) times daily.    Marland Kitchen. LINZESS 72 MCG capsule Take 72 mcg by mouth daily after breakfast.    . magnesium oxide (MAG-OX)  400 MG tablet Take 400 mg by mouth daily after breakfast.    . meloxicam (MOBIC) 7.5 MG tablet Take 7.5 mg by mouth daily as needed for pain.     . mirtazapine (REMERON) 15 MG tablet Take 15 mg by mouth every evening.     . mometasone-formoterol (DULERA) 200-5 MCG/ACT AERO Inhale 2 puffs into the lungs 3 (three) times daily.     . Multiple Vitamin (MULTIVITAMIN WITH MINERALS) TABS tablet Take 1 tablet by mouth daily. ONE-A-DAY MULTIVITAMIN 50+    . nitroGLYCERIN (NITROSTAT) 0.4 MG SL tablet Place 0.4 mg under the tongue every 5 (five) minutes as needed for chest pain.    Marland Kitchen. ofloxacin (OCUFLOX) 0.3 % ophthalmic solution Place 1 drop into the left eye 4 (four) times daily.    Marland Kitchen. omeprazole (PRILOSEC) 40 MG capsule Take 40 mg by mouth daily after breakfast.    . Potassium  Chloride CR (MICRO-K) 8 MEQ CPCR capsule CR Take 1 capsule by mouth daily after breakfast.    . tiotropium (SPIRIVA) 18 MCG inhalation capsule Place 18 mcg into inhaler and inhale daily after breakfast.     . traMADol (ULTRAM) 50 MG tablet Take 1 tablet (50 mg total) by mouth every 6 (six) hours as needed. (Patient taking differently: Take 50 mg by mouth 2 (two) times daily. ) 30 tablet 0  . zolpidem (AMBIEN) 5 MG tablet Take 5 mg by mouth at bedtime.    . senna-docusate (SENOKOT-S) 8.6-50 MG tablet Take 1 tablet by mouth at bedtime as needed for mild constipation. (Patient not taking: Reported on 11/26/2016) 30 tablet 3     . abacavir-dolutegravir-lamiVUDine  1 tablet Oral QPC breakfast  . albuterol  2.5 mg Nebulization TID  . atorvastatin  10 mg Oral QPC breakfast  . calcium-vitamin D  1 tablet Oral QPC breakfast  . carvedilol  12.5 mg Oral BID WC  . enoxaparin  80 mg Subcutaneous Q12H  . escitalopram  20 mg Oral QPC breakfast  . furosemide  20 mg Oral Daily  . ketorolac  1 drop Left Eye QID  . levothyroxine  50 mcg Oral QAC breakfast  . linaclotide  72 mcg Oral QPC breakfast  . loratadine  10 mg Oral Daily  . magnesium oxide  400 mg Oral QPC breakfast  . mirtazapine  15 mg Oral QPM  . mometasone-formoterol  2 puff Inhalation TID  . multivitamin with minerals  1 tablet Oral Daily  . nitroGLYCERIN  0.5 inch Topical STAT  . ofloxacin  1 drop Left Eye QID  . pantoprazole  40 mg Oral Daily  . potassium chloride  10 mEq Oral Daily  . tiotropium  18 mcg Inhalation QPC breakfast  . zolpidem  5 mg Oral QHS    Infusions: . sodium chloride 125 mL/hr at 03/04/17 0426    Allergies  Allergen Reactions  . Aspirin Anaphylaxis  . Bee Venom Anaphylaxis  . Penicillins Anaphylaxis and Other (See Comments)    Has patient had a PCN reaction causing immediate rash, facial/tongue/throat swelling, SOB or lightheadedness with hypotension: Yes Has patient had a PCN reaction causing severe rash  involving mucus membranes or skin necrosis: No Has patient had a PCN reaction that required hospitalization No Has patient had a PCN reaction occurring within the last 10 years: Yes If all of the above answers are "NO", then may proceed with Cephalosporin use.  . Prednisone Anaphylaxis  . Sulfa Antibiotics Anaphylaxis  . Sulfasalazine Anaphylaxis  . Theophyllines Anaphylaxis  .  Theophylline Swelling    Social History   Social History  . Marital status: Widowed    Spouse name: N/A  . Number of children: N/A  . Years of education: N/A   Occupational History  . Not on file.   Social History Main Topics  . Smoking status: Former Smoker    Quit date: 12/16/2001  . Smokeless tobacco: Never Used  . Alcohol use No  . Drug use: No  . Sexual activity: Not on file   Other Topics Concern  . Not on file   Social History Narrative  . No narrative on file    Family History  Problem Relation Age of Onset  . CAD Unknown     PHYSICAL EXAM: Vitals:   03/04/17 0339 03/04/17 0405  BP:  (!) 143/97  Pulse:  93  Resp: 16 18  Temp:  98.1 F (36.7 C)     Intake/Output Summary (Last 24 hours) at 03/04/17 0832 Last data filed at 03/04/17 0426  Gross per 24 hour  Intake                0 ml  Output                0 ml  Net                0 ml    General:  Well appearing. No respiratory difficulty HEENT: normal Neck: supple. no JVD. Carotids 2+ bilat; no bruits. No lymphadenopathy or thryomegaly appreciated. Cor: PMI nondisplaced. Regular rate & rhythm. No rubs, gallops or murmurs. Lungs: clear Abdomen: soft, nontender, nondistended. No hepatosplenomegaly. No bruits or masses. Good bowel sounds. Extremities: no cyanosis, clubbing, rash, edema Neuro: alert & oriented x 3, cranial nerves grossly intact. moves all 4 extremities w/o difficulty. Affect pleasant.  ECG: sinus rhythm no acute changes  Results for orders placed or performed during the hospital encounter of 03/03/17  (from the past 24 hour(s))  CBC     Status: Abnormal   Collection Time: 03/03/17  8:24 PM  Result Value Ref Range   WBC 9.1 3.8 - 10.6 K/uL   RBC 4.04 (L) 4.40 - 5.90 MIL/uL   Hemoglobin 13.5 13.0 - 18.0 g/dL   HCT 16.1 (L) 09.6 - 04.5 %   MCV 98.1 80.0 - 100.0 fL   MCH 33.5 26.0 - 34.0 pg   MCHC 34.2 32.0 - 36.0 g/dL   RDW 40.9 81.1 - 91.4 %   Platelets 293 150 - 440 K/uL  Comprehensive metabolic panel     Status: Abnormal   Collection Time: 03/03/17  8:24 PM  Result Value Ref Range   Sodium 141 135 - 145 mmol/L   Potassium 3.6 3.5 - 5.1 mmol/L   Chloride 106 101 - 111 mmol/L   CO2 27 22 - 32 mmol/L   Glucose, Bld 113 (H) 65 - 99 mg/dL   BUN 17 6 - 20 mg/dL   Creatinine, Ser 7.82 (H) 0.61 - 1.24 mg/dL   Calcium 9.3 8.9 - 95.6 mg/dL   Total Protein 7.9 6.5 - 8.1 g/dL   Albumin 3.7 3.5 - 5.0 g/dL   AST 46 (H) 15 - 41 U/L   ALT 72 (H) 17 - 63 U/L   Alkaline Phosphatase 189 (H) 38 - 126 U/L   Total Bilirubin 0.6 0.3 - 1.2 mg/dL   GFR calc non Af Amer 50 (L) >60 mL/min   GFR calc Af Amer 58 (L) >60 mL/min   Anion  gap 8 5 - 15  Troponin I     Status: None   Collection Time: 03/03/17  8:24 PM  Result Value Ref Range   Troponin I <0.03 <0.03 ng/mL  Protime-INR     Status: None   Collection Time: 03/03/17  8:24 PM  Result Value Ref Range   Prothrombin Time 13.9 11.4 - 15.2 seconds   INR 1.07   APTT     Status: None   Collection Time: 03/03/17  8:24 PM  Result Value Ref Range   aPTT 28 24 - 36 seconds  TSH     Status: Abnormal   Collection Time: 03/04/17  4:14 AM  Result Value Ref Range   TSH 16.131 (H) 0.350 - 4.500 uIU/mL  Troponin I     Status: None   Collection Time: 03/04/17  4:14 AM  Result Value Ref Range   Troponin I <0.03 <0.03 ng/mL   Ct Angio Chest Pe W Or Wo Contrast  Result Date: 03/03/2017 CLINICAL DATA:  Severe chest pain history of multiple DVT EXAM: CT ANGIOGRAPHY CHEST WITH CONTRAST TECHNIQUE: Multidetector CT imaging of the chest was performed using  the standard protocol during bolus administration of intravenous contrast. Multiplanar CT image reconstructions and MIPs were obtained to evaluate the vascular anatomy. CONTRAST:  75 mL Isovue 370 intravenous COMPARISON:  02/25/2017, ultrasound 02/25/2017, CT chest 11/26/2016 FINDINGS: Cardiovascular: Satisfactory opacification of the pulmonary arteries to the segmental level. No evidence of pulmonary embolism. Linear web like defect within left lower lobe segmental arterial branches unchanged. Non aneurysmal aorta. Common origin of brachiocephalic and left common carotid vessels. No aneurysmal dilatation. Coronary artery calcification. Normal heart size. No pericardial effusion. Mediastinum/Nodes: No enlarged mediastinal, hilar, or axillary lymph nodes. Thyroid gland, trachea, and esophagus demonstrate no significant findings. Lungs/Pleura: Lungs are clear. No pleural effusion or pneumothorax. Upper Abdomen: No acute abnormality. Musculoskeletal: Post augmentation changes at T6. Partial anterior fusion at T4-T5. Chronic superior endplate deformity at T8. Old right rib fractures. Review of the MIP images confirms the above findings. IMPRESSION: 1. No acute pulmonary embolus, aneurysm or dissection is seen 2. Clear lung fields Electronically Signed   By: Jasmine Pang M.D.   On: 03/03/2017 21:37     ASSESSMENT AND PLAN: atypical chest pain with MI being ruled out and mostly abdominal pain.  He had a CTA of the chest which shows no evidence of emboli to lungs with history of DVT. Advise GI evaluation as patient is having abdominal pain. No need to do any cardiac workup at this time.  Havier Deeb [employer]

## 2017-03-04 NOTE — Plan of Care (Signed)
Problem: Pain Managment: Goal: General experience of comfort will improve Outcome: Not Progressing Pt still complaining of pain in his abdomen and chest, pt thinks he has fluid in his abdomen, treated for pain with norco with relief, will continue to monitor.  Problem: Tissue Perfusion: Goal: Risk factors for ineffective tissue perfusion will decrease Outcome: Progressing lovenox for VTE

## 2017-03-04 NOTE — Care Management Obs Status (Signed)
MEDICARE OBSERVATION STATUS NOTIFICATION   Patient Details  Name: Brian Hampton MRN: [id number] Date of Birth: Dec 13, 1955   Medicare Observation Status Notification Given:  Yes    Eber HongGreene, Hakim Minniefield R, RN 03/04/2017, 9:10 AM

## 2017-03-04 NOTE — H&P (Signed)
Brian Hampton is an 61 y.o. male.   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 The patient had been seen by his primary care doctor today for the same. He underwent a CTA of the chest to rule out pulmonary embolism. When asked to define his chest pain he states that he has sharp shooting pains across his chest and the right side of his ribs. The pain began after mild activity. The patient noted that he became diaphoretic and mildly nauseous. It radiated down his right arm.His pain also extends along his lower abdomen. The patient states that his abdomen has grown in size dramatically over the last week. He has been told that he has "a bad liver". He has had an MI in the past and his primary care physician sent him to the hospital for further evaluation. His initial troponin was negative but due to his risk factors emergency department staff as the hospitalist service for admission.  Past Medical History:  Diagnosis Date  . Anemia   . Asthma   . Cataracts, bilateral    worse in Rt eye  . Collagen vascular disease (HCC)   . COPD (chronic obstructive pulmonary disease) (HCC)   . Coronary artery disease   . Depression   . DVT (deep venous thrombosis) (HCC)   . Dysrhythmia   . Emphysema/COPD (HCC)   . Environmental and seasonal allergies   . H/O blood clots   . Heart murmur   . HIV (human immunodeficiency virus infection) (HCC)   . Hypertension   . Leaky heart valve    x 3  . Lung mass   . Myocardial infarction (HCC)    in 2000  . Pneumonia    year ago  . Pulmonary emboli (HCC)   . Type 2 diabetes mellitus (HCC)     Past Surgical History:  Procedure Laterality Date  . ANKLE ARTHROSCOPY    . IVC FILTER INSERTION    . KYPHOPLASTY N/A 12/21/2016   Procedure: KYPHOPLASTY;  Surgeon: Kennedy Bucker, MD;  Location: [facility] ORS;   Service: Orthopedics;  Laterality: N/A;  . PERIPHERAL VASCULAR CATHETERIZATION N/A 03/16/2016   Procedure: IVC Filter Insertion;  Surgeon: Renford Dills, MD;  Location: [facility] INVASIVE CV LAB;  Service: Cardiovascular;  Laterality: N/A;  . SINUS EXPLORATION    . WRIST ARTHROSCOPY Right     Family History  Problem Relation Age of Onset  . CAD Unknown    Social History:  reports that he quit smoking about 15 years ago. He has never used smokeless tobacco. He reports that he does not drink alcohol or use drugs.  Allergies:  Allergies  Allergen Reactions  . Aspirin Anaphylaxis  . Bee Venom Anaphylaxis  . Penicillins Anaphylaxis and Other (See Comments)    Has patient had a PCN reaction causing immediate rash, facial/tongue/throat swelling, SOB or lightheadedness with hypotension: Yes Has patient had a PCN reaction causing severe rash involving mucus membranes or skin necrosis: No Has patient had a PCN reaction that required hospitalization No Has patient had a PCN reaction occurring within the last 10 years: Yes If all of the above answers are "NO", then may proceed with Cephalosporin use.  . Prednisone Anaphylaxis  . Sulfa Antibiotics Anaphylaxis  . Sulfasalazine Anaphylaxis  . Theophyllines Anaphylaxis  . Theophylline Swelling    Medications Prior to Admission  Medication Sig  Dispense Refill  . abacavir-dolutegravir-lamiVUDine (TRIUMEQ) 600-50-300 MG tablet Take 1 tablet by mouth daily after breakfast. Reported on 01/30/2016    . albuterol (PROVENTIL HFA;VENTOLIN HFA) 108 (90 BASE) MCG/ACT inhaler Inhale 2 puffs into the lungs every 6 (six) hours as needed for wheezing or shortness of breath.    Marland Kitchen albuterol (PROVENTIL) (2.5 MG/3ML) 0.083% nebulizer solution Take 2.5 mg by nebulization 3 (three) times daily.     Marland Kitchen atorvastatin (LIPITOR) 10 MG tablet Take 10 mg by mouth daily after breakfast.    . Calcium Carbonate-Vitamin D (CALCIUM 600+D) 600-400 MG-UNIT tablet Take 1 tablet by  mouth daily after breakfast.     . carvedilol (COREG) 12.5 MG tablet Take 12.5 mg by mouth 2 (two) times daily with a meal.    . cetirizine (ZYRTEC) 10 MG tablet Take 10 mg by mouth daily as needed for allergies.     Marland Kitchen enoxaparin (LOVENOX) 80 MG/0.8ML injection Inject 0.8 mLs (80 mg total) into the skin every 12 (twelve) hours. 51 mL 1  . EPINEPHrine (EPIPEN 2-PAK) 0.3 mg/0.3 mL IJ SOAJ injection Inject 0.3 mg into the muscle once as needed (for severe allergic reaction).    Marland Kitchen escitalopram (LEXAPRO) 20 MG tablet Take 20 mg by mouth daily after breakfast.     . furosemide (LASIX) 20 MG tablet Take 20 mg by mouth daily.     Marland Kitchen HYDROcodone-acetaminophen (NORCO) 5-325 MG tablet Take 1 tablet by mouth every 4 (four) hours as needed for moderate pain. 15 tablet 0  . ketorolac (ACULAR) 0.5 % ophthalmic solution Place 1 drop into the left eye 4 (four) times daily.    Marland Kitchen LINZESS 72 MCG capsule Take 72 mcg by mouth daily after breakfast.    . magnesium oxide (MAG-OX) 400 MG tablet Take 400 mg by mouth daily after breakfast.    . meloxicam (MOBIC) 7.5 MG tablet Take 7.5 mg by mouth daily as needed for pain.     . mirtazapine (REMERON) 15 MG tablet Take 15 mg by mouth every evening.     . mometasone-formoterol (DULERA) 200-5 MCG/ACT AERO Inhale 2 puffs into the lungs 3 (three) times daily.     . Multiple Vitamin (MULTIVITAMIN WITH MINERALS) TABS tablet Take 1 tablet by mouth daily. ONE-A-DAY MULTIVITAMIN 50+    . nitroGLYCERIN (NITROSTAT) 0.4 MG SL tablet Place 0.4 mg under the tongue every 5 (five) minutes as needed for chest pain.    Marland Kitchen ofloxacin (OCUFLOX) 0.3 % ophthalmic solution Place 1 drop into the left eye 4 (four) times daily.    Marland Kitchen omeprazole (PRILOSEC) 40 MG capsule Take 40 mg by mouth daily after breakfast.    . Potassium Chloride CR (MICRO-K) 8 MEQ CPCR capsule CR Take 1 capsule by mouth daily after breakfast.    . tiotropium (SPIRIVA) 18 MCG inhalation capsule Place 18 mcg into inhaler and inhale  daily after breakfast.     . traMADol (ULTRAM) 50 MG tablet Take 1 tablet (50 mg total) by mouth every 6 (six) hours as needed. (Patient taking differently: Take 50 mg by mouth 2 (two) times daily. ) 30 tablet 0  . zolpidem (AMBIEN) 5 MG tablet Take 5 mg by mouth at bedtime.    . senna-docusate (SENOKOT-S) 8.6-50 MG tablet Take 1 tablet by mouth at bedtime as needed for mild constipation. (Patient not taking: Reported on 11/26/2016) 30 tablet 3    Results for orders placed or performed during the hospital encounter of 03/03/17 (from the past 48 hour(s))  CBC     Status: Abnormal   Collection Time: 03/03/17  8:24 PM  Result Value Ref Range   WBC 9.1 3.8 - 10.6 K/uL   RBC 4.04 (L) 4.40 - 5.90 MIL/uL   Hemoglobin 13.5 13.0 - 18.0 g/dL   HCT 39.6 (L) 40.0 - 52.0 %   MCV 98.1 80.0 - 100.0 fL   MCH 33.5 26.0 - 34.0 pg   MCHC 34.2 32.0 - 36.0 g/dL   RDW 14.5 11.5 - 14.5 %   Platelets 293 150 - 440 K/uL  Comprehensive metabolic panel     Status: Abnormal   Collection Time: 03/03/17  8:24 PM  Result Value Ref Range   Sodium 141 135 - 145 mmol/L   Potassium 3.6 3.5 - 5.1 mmol/L   Chloride 106 101 - 111 mmol/L   CO2 27 22 - 32 mmol/L   Glucose, Bld 113 (H) 65 - 99 mg/dL   BUN 17 6 - 20 mg/dL   Creatinine, Ser 1.49 (H) 0.61 - 1.24 mg/dL   Calcium 9.3 8.9 - 10.3 mg/dL   Total Protein 7.9 6.5 - 8.1 g/dL   Albumin 3.7 3.5 - 5.0 g/dL   AST 46 (H) 15 - 41 U/L   ALT 72 (H) 17 - 63 U/L   Alkaline Phosphatase 189 (H) 38 - 126 U/L   Total Bilirubin 0.6 0.3 - 1.2 mg/dL   GFR calc non Af Amer 50 (L) >60 mL/min   GFR calc Af Amer 58 (L) >60 mL/min    Comment: (NOTE) The eGFR has been calculated using the CKD EPI equation. This calculation has not been validated in all clinical situations. eGFR's persistently <60 mL/min signify possible Chronic Kidney Disease.    Anion gap 8 5 - 15  Troponin I     Status: None   Collection Time: 03/03/17  8:24 PM  Result Value Ref Range   Troponin I <0.03 <0.03  ng/mL  Protime-INR     Status: None   Collection Time: 03/03/17  8:24 PM  Result Value Ref Range   Prothrombin Time 13.9 11.4 - 15.2 seconds   INR 1.07   APTT     Status: None   Collection Time: 03/03/17  8:24 PM  Result Value Ref Range   aPTT 28 24 - 36 seconds  TSH     Status: Abnormal   Collection Time: 03/04/17  4:14 AM  Result Value Ref Range   TSH 16.131 (H) 0.350 - 4.500 uIU/mL    Comment: Performed by a 3rd Generation assay with a functional sensitivity of <=0.01 uIU/mL.  Troponin I     Status: None   Collection Time: 03/04/17  4:14 AM  Result Value Ref Range   Troponin I <0.03 <0.03 ng/mL   Ct Angio Chest Pe W Or Wo Contrast  Result Date: 03/03/2017 CLINICAL DATA:  Severe chest pain history of multiple DVT EXAM: CT ANGIOGRAPHY CHEST WITH CONTRAST TECHNIQUE: Multidetector CT imaging of the chest was performed using the standard protocol during bolus administration of intravenous contrast. Multiplanar CT image reconstructions and MIPs were obtained to evaluate the vascular anatomy. CONTRAST:  75 mL Isovue 370 intravenous COMPARISON:  02/25/2017, ultrasound 02/25/2017, CT chest 11/26/2016 FINDINGS: Cardiovascular: Satisfactory opacification of the pulmonary arteries to the segmental level. No evidence of pulmonary embolism. Linear web like defect within left lower lobe segmental arterial branches unchanged. Non aneurysmal aorta. Common origin of brachiocephalic and left common carotid vessels. No aneurysmal dilatation. Coronary artery calcification. Normal heart size.  No pericardial effusion. Mediastinum/Nodes: No enlarged mediastinal, hilar, or axillary lymph nodes. Thyroid gland, trachea, and esophagus demonstrate no significant findings. Lungs/Pleura: Lungs are clear. No pleural effusion or pneumothorax. Upper Abdomen: No acute abnormality. Musculoskeletal: Post augmentation changes at T6. Partial anterior fusion at T4-T5. Chronic superior endplate deformity at T8. Old right rib  fractures. Review of the MIP images confirms the above findings. IMPRESSION: 1. No acute pulmonary embolus, aneurysm or dissection is seen 2. Clear lung fields Electronically Signed   By: Donavan Foil M.D.   On: 03/03/2017 21:37    Review of Systems  Constitutional: Positive for diaphoresis. Negative for chills and fever.  HENT: Negative for sore throat and tinnitus.   Eyes: Negative for blurred vision and redness.  Respiratory: Negative for cough and shortness of breath.   Cardiovascular: Positive for chest pain. Negative for palpitations, orthopnea and PND.  Gastrointestinal: Positive for abdominal pain. Negative for diarrhea, nausea and vomiting.  Genitourinary: Negative for dysuria, frequency and urgency.  Musculoskeletal: Negative for joint pain and myalgias.  Skin: Negative for rash.       No lesions  Neurological: Negative for speech change, focal weakness and weakness.  Endo/Heme/Allergies: Does not bruise/bleed easily.       No temperature intolerance  Psychiatric/Behavioral: Negative for depression and suicidal ideas.    Blood pressure (!) 143/97, pulse 93, temperature 98.1 F (36.7 C), temperature source Oral, resp. rate 18, height '5\' 5"'$  (1.651 m), weight 83.6 kg (184 lb 4.8 oz), SpO2 97 %. Physical Exam  Vitals reviewed. Constitutional: He is oriented to person, place, and time. He appears well-developed and well-nourished. No distress.  HENT:  Head: Normocephalic and atraumatic.  Mouth/Throat: Oropharynx is clear and moist.  Eyes: Pupils are equal, round, and reactive to light. Conjunctivae and EOM are normal. No scleral icterus.  Neck: Normal range of motion. Neck supple. No JVD present. No tracheal deviation present. No thyromegaly present.  Cardiovascular: Normal rate, regular rhythm and normal heart sounds.  Exam reveals no gallop and no friction rub.   No murmur heard. Respiratory: Effort normal and breath sounds normal. No respiratory distress.  GI: Soft. Bowel  sounds are normal. He exhibits distension. There is tenderness.  Fluid wave  Genitourinary:  Genitourinary Comments: Deferred  Musculoskeletal: Normal range of motion. He exhibits no edema.  Lymphadenopathy:    He has no cervical adenopathy.  Neurological: He is alert and oriented to person, place, and time. No cranial nerve deficit.  Skin: Skin is warm and dry. No rash noted. No erythema.  Psychiatric: He has a normal mood and affect. His behavior is normal. Judgment and thought content normal.    Assessment/Plan This is a 61 year old male admitted for chest pain. 1. Chest pain: Atypical for cardiac pain. The patient does have a history of coronary artery disease as well as valvular disease. Continue to follow cardiac biomarkers. Monitor telemetry. Cardiology consult ordered (Dr. Humphrey Rolls). 2. CAD: The patient is not on aspirin due to allergy. He is also not on Plavix (?). Discuss with cardiology 3. Ascites: Distention due to increasing abdominal ascites contributing to pain. I suspect the patient may have hep C. Check hepatitis panel. The patient has Lasix as part of his home medication. Consider adding spironolactone in appropriate ratio for ascites. PT/INR normal. Consider therapeutic paracentesis 4. Hypertension: Uncontrolled; nitro placed on chest. Continue Coreg. Labetalol as needed 5. COPD: Continue inhaled corticosteroid as well as Spiriva. Albuterol as needed. 6. HIV: Continue triple therapy 7. DVT: Acute and recurrent.  Continue Lovenox indefinitely 8. Hyperlipidemia: Continue statin therapy 9. Depression: Continue Remeron and Lexapro 10. Hypothyroidism: I checked the patient's TSH which is grossly elevated. Start levothyroxine 11. DVT prophylaxis: Full dose anticoagulation 12. GI prophylaxis: Pantoprazole per home regimen The patient is a full code. Time spent on admission orders and patient care approximately 45 minutes  Harrie Foreman, MD 03/04/2017, 7:55 AM

## 2017-03-04 NOTE — ED Notes (Signed)
This RN to bedside. Pt states "well nobody has come to see me so I don't know what's going on". This RN explained that pt was waiting to see admitting MD. Pt asked if he was going to come see patient tonight. This RN apologized to patient for delay in admitting MD coming to bedside, explained increased number of patients getting admitted and long wait times for admission at this time. Pt states understanding. Pt c/o bilateral abdominal pain after drinking contrast. Pt is noted to be in NAD, resting in bed comfortably, denies any needs at this time. VSS and WNL. Will continue to monitor for further patient needs at this time.

## 2017-03-05 DIAGNOSIS — I2510 Atherosclerotic heart disease of native coronary artery without angina pectoris: Secondary | ICD-10-CM | POA: Diagnosis not present

## 2017-03-05 DIAGNOSIS — R109 Unspecified abdominal pain: Secondary | ICD-10-CM | POA: Diagnosis not present

## 2017-03-05 DIAGNOSIS — R079 Chest pain, unspecified: Secondary | ICD-10-CM | POA: Diagnosis not present

## 2017-03-05 DIAGNOSIS — R188 Other ascites: Secondary | ICD-10-CM | POA: Diagnosis not present

## 2017-03-05 DIAGNOSIS — R0789 Other chest pain: Secondary | ICD-10-CM | POA: Diagnosis not present

## 2017-03-05 DIAGNOSIS — I10 Essential (primary) hypertension: Secondary | ICD-10-CM | POA: Diagnosis not present

## 2017-03-05 LAB — HEPATITIS PANEL, ACUTE
HCV Ab: 0.1 s/co ratio (ref 0.0–0.9)
Hep A IgM: NEGATIVE
Hep B C IgM: NEGATIVE
Hepatitis B Surface Ag: NEGATIVE

## 2017-03-05 LAB — HEMOGLOBIN A1C
Hgb A1c MFr Bld: 5.9 % — ABNORMAL HIGH (ref 4.8–5.6)
Mean Plasma Glucose: 123 mg/dL

## 2017-03-05 MED ORDER — FUROSEMIDE 10 MG/ML IJ SOLN
40.0000 mg | Freq: Once | INTRAMUSCULAR | Status: AC
  Administered 2017-03-05: 40 mg via INTRAVENOUS
  Filled 2017-03-05: qty 4

## 2017-03-05 MED ORDER — HYDROCODONE-ACETAMINOPHEN 5-325 MG PO TABS: 1.0000 | ORAL_TABLET | ORAL | 0 refills | Status: DC | PRN

## 2017-03-05 MED ORDER — ENOXAPARIN SODIUM 80 MG/0.8ML ~~LOC~~ SOLN: 85.0000 mg | Freq: Two times a day (BID) | SUBCUTANEOUS | Status: DC

## 2017-03-05 MED ORDER — LEVOTHYROXINE SODIUM 50 MCG PO TABS: 50.0000 ug | ORAL_TABLET | Freq: Every day | ORAL | 0 refills | Status: DC

## 2017-03-05 NOTE — Progress Notes (Signed)
SUBJECTIVE: Patient still has abdominal pain but no chest pain   Vitals:   03/05/17 0403 03/05/17 0500 03/05/17 0751 03/05/17 1059  BP: 130/76   138/88  Pulse: (!) 57   81  Resp: 18   15  Temp: 97.8 F (36.6 C)   97.6 F (36.4 C)  TempSrc: Oral     SpO2: 100%  95% 97%  Weight:  190 lb 11.2 oz (86.5 kg)    Height:        Intake/Output Summary (Last 24 hours) at 03/05/17 1105 Last data filed at 03/05/17 0900  Gross per 24 hour  Intake              480 ml  Output             1000 ml  Net             -520 ml    LABS: Basic Metabolic Panel:  Recent Labs  16/05/9606/26/18 2024  NA 141  K 3.6  CL 106  CO2 27  GLUCOSE 113*  BUN 17  CREATININE 1.49*  CALCIUM 9.3   Liver Function Tests:  Recent Labs  03/03/17 2024  AST 46*  ALT 72*  ALKPHOS 189*  BILITOT 0.6  PROT 7.9  ALBUMIN 3.7   No results for input(s): LIPASE, AMYLASE in the last 72 hours. CBC:  Recent Labs  03/03/17 2024  WBC 9.1  HGB 13.5  HCT 39.6*  MCV 98.1  PLT 293   Cardiac Enzymes:  Recent Labs  03/04/17 0414 03/04/17 1021 03/04/17 1451  TROPONINI <0.03 <0.03 <0.03   BNP: Invalid input(s): POCBNP D-Dimer: No results for input(s): DDIMER in the last 72 hours. Hemoglobin A1C:  Recent Labs  03/04/17 0414  HGBA1C 5.9*   Fasting Lipid Panel: No results for input(s): CHOL, HDL, LDLCALC, TRIG, CHOLHDL, LDLDIRECT in the last 72 hours. Thyroid Function Tests:  Recent Labs  03/04/17 0414  TSH 16.131*   Anemia Panel: No results for input(s): VITAMINB12, FOLATE, FERRITIN, TIBC, IRON, RETICCTPCT in the last 72 hours.   PHYSICAL EXAM General: Well developed, well nourished, in no acute distress HEENT:  Normocephalic and atramatic Neck:  No JVD.  Lungs: Clear bilaterally to auscultation and percussion. Heart: HRRR . Normal S1 and S2 without gallops or murmurs.  Abdomen: Bowel sounds are positive, abdomen soft and non-tender  Msk:  Back normal, normal gait. Normal strength and tone for  age. Extremities: No clubbing, cyanosis or edema.   Neuro: Alert and oriented X 3. Psych:  Good affect, responds appropriately  TELEMETRY:Sinus rhythm  ASSESSMENT AND PLAN: Atypical chest pain with abdominal pain being evaluated by hospitalists. He has history of DVT with being resistant to Coumadin. He is currently on Lovenox and advise starting the patient on Xarelto.  Active Problems:   Chest pain    Tanina Barb A, MD, [facility] SystemFACC 03/05/2017 11:05 AM

## 2017-03-05 NOTE — Progress Notes (Signed)
Lovenox dose adjusted from 80 mg to 85 mg q12 hours due to change in documented weight.   Brian Hampton, PharmD

## 2017-03-05 NOTE — Discharge Summary (Addendum)
[facility] - [place] at [facility], 61 y.o., DOB [birth year], MRN [id number]. 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 The patient had been seen by his primary care doctor today for the same. He underwent a CTA of the chest to rule out pulmonary embolism. Patient had a CT scan which was negative for PE. His cardiac enzymes were negative. He was seen by his cardiologist who felt that this was noncardiac. Patient then started complaining of abdominal pain and had a CT scan of the abdomen which showed no acute pathology. Patient's oxygen as well on room air. He is stable for discharge.           Consults  cardiology  Significant Tests:  See full reports for all details     Ct Abdomen Pelvis Wo Contrast  Result Date: 03/04/2017 CLINICAL DATA:  Diffuse abdominal pain, nausea and vomiting for the past week. EXAM: CT ABDOMEN AND PELVIS WITHOUT CONTRAST TECHNIQUE: Multidetector CT imaging of the abdomen and pelvis was performed following the standard protocol without IV contrast. COMPARISON:  12/22/2016. FINDINGS: Lower chest: Minimal bilateral dependent atelectasis.  Hepatobiliary: No focal liver abnormality is seen. No gallstones, gallbladder wall thickening, or biliary dilatation. Pancreas: Unremarkable. No pancreatic ductal dilatation or surrounding inflammatory changes. Spleen: Normal in size without focal abnormality. Adrenals/Urinary Tract: Adrenal glands are unremarkable. Kidneys are normal, without renal calculi, focal lesion, or hydronephrosis. Bladder is unremarkable. Stomach/Bowel: Stomach is within normal limits. Appendix appears normal. No evidence of bowel wall thickening, distention, or inflammatory changes. Vascular/Lymphatic: Mild iliac femoral artery calcifications without aneurysm. Inferior vena cava filter. No enlarged lymph nodes. Reproductive: Prostate is unremarkable. Other: Small amount of subcutaneous air inferiorly on the right, compatible with recent injection. Small umbilical hernia containing fat. Musculoskeletal: Old, healed right rib fractures. Lumbar and lower thoracic spine degenerative changes. IMPRESSION: No acute abnormality. Electronically Signed   By: Beckie Salts M.D.   On: 03/04/2017 18:30   Ct Angio Chest Pe W Or Wo Contrast  Result Date: 03/03/2017 CLINICAL DATA:  Severe chest pain history of multiple DVT EXAM: CT ANGIOGRAPHY CHEST WITH CONTRAST TECHNIQUE: Multidetector CT imaging of the chest was performed using the standard protocol during bolus administration of intravenous contrast. Multiplanar CT image reconstructions and MIPs were obtained to evaluate the vascular anatomy. CONTRAST:  75 mL Isovue 370 intravenous COMPARISON:  02/25/2017, ultrasound 02/25/2017, CT chest 11/26/2016 FINDINGS: Cardiovascular: Satisfactory opacification of the pulmonary arteries to the segmental level. No evidence of pulmonary embolism. Linear web like defect within left lower lobe segmental arterial branches unchanged. Non aneurysmal aorta. Common origin of brachiocephalic and left common carotid vessels. No aneurysmal dilatation. Coronary artery  calcification. Normal heart size. No pericardial effusion. Mediastinum/Nodes: No enlarged mediastinal, hilar, or  axillary lymph nodes. Thyroid gland, trachea, and esophagus demonstrate no significant findings. Lungs/Pleura: Lungs are clear. No pleural effusion or pneumothorax. Upper Abdomen: No acute abnormality. Musculoskeletal: Post augmentation changes at T6. Partial anterior fusion at T4-T5. Chronic superior endplate deformity at T8. Old right rib fractures. Review of the MIP images confirms the above findings. IMPRESSION: 1. No acute pulmonary embolus, aneurysm or dissection is seen 2. Clear lung fields Electronically Signed   By: Jasmine PangKim  Fujinaga M.D.   On: 03/03/2017 21:37   Ct Angio Chest Pe W And/or Wo Contrast  Result Date: 02/25/2017 CLINICAL DATA:  Right leg DVT.  Chest pain and dyspnea x1 week. EXAM: CT ANGIOGRAPHY CHEST WITH CONTRAST TECHNIQUE: Multidetector CT imaging of the chest was performed using the standard protocol during bolus administration of intravenous contrast. Multiplanar CT image reconstructions and MIPs were obtained to evaluate the vascular anatomy. CONTRAST:  75 cc Isovue 370 IV COMPARISON:  11/26/2016 CT FINDINGS: Cardiovascular: Satisfactory opacification of the pulmonary arteries to the subsegmental level. No evidence of pulmonary embolism. Normal heart size. Coronary arterial calcifications along the LAD. No pericardial effusion. No aortic aneurysm or dissection. Two vessel takeoff the aortic arch with common origin of the right brachiocephalic and left common carotid arteries. Mediastinum/Nodes: No enlarged mediastinal, hilar, or axillary lymph nodes. Thyroid gland, trachea, and esophagus demonstrate no significant findings. Lungs/Pleura: Lungs demonstrate dependent atelectasis bilaterally. Tiny subpleural bleb in the right upper lobe with a few scattered bowel tiny nonspecific subpleural densities possibly representing chronic postinfectious or postinflammatory change or  atelectasis. No pleural effusion or pneumothorax. Upper Abdomen: No acute abnormality.  Tip of an IVC filter is seen. Musculoskeletal: No chest wall abnormality. No acute or significant osseous findings. Old left-sided healed rib fractures. Old T6 fracture with augmentation and chronic moderate T8 compression fractures. T12-L1 degenerative disc disease with vacuum disc. Review of the MIP images confirms the above findings. IMPRESSION: 1. No acute pulmonary embolus or aortic aneurysm.  No dissection. 2. Coronary arteriosclerosis. 3. No active cardiopulmonary disease. Electronically Signed   By: Tollie Ethavid  Kwon M.D.   On: 02/25/2017 20:57   [place] Venous Img Lower Unilateral Right  Result Date: 02/25/2017 CLINICAL DATA:  Initial evaluation for acute leg pain. EXAM: Right LOWER EXTREMITY VENOUS DOPPLER ULTRASOUND TECHNIQUE: Gray-scale sonography with graded compression, as well as color Doppler and duplex ultrasound were performed to evaluate the lower extremity deep venous systems from the level of the common femoral vein and including the common femoral, femoral, profunda femoral, popliteal and calf veins including the posterior tibial, peroneal and gastrocnemius veins when visible. The superficial great saphenous vein was also interrogated. Spectral Doppler was utilized to evaluate flow at rest and with distal augmentation maneuvers in the common femoral, femoral and popliteal veins. COMPARISON:  Prior ultrasound from 01/13/2006. FINDINGS: Contralateral Common Femoral Vein: Respiratory phasicity is normal and symmetric with the symptomatic side. No evidence of thrombus. Normal compressibility. Common Femoral Vein: No evidence of thrombus. Normal compressibility, respiratory phasicity and response to augmentation. Saphenofemoral Junction: No evidence of thrombus. Normal compressibility and flow on color Doppler imaging. Profunda Femoral Vein: No evidence of thrombus. Normal compressibility and flow on color Doppler  imaging. Femoral Vein: The femoral vein/SFV is duplicated. Occlusive thrombus within 1 set of the right femoral vein beginning at the proximal mid thigh. Loss of normal compressibility. Popliteal Vein: Occlusive thrombus present throughout the popliteal vein with loss of normal compressibility. Calf Veins: Occlusive thrombus within the right posterior tibial and peroneal veins with loss of normal  compressibility. Superficial Great Saphenous Vein: No evidence of thrombus. Normal compressibility and flow on color Doppler imaging. IMPRESSION: Positive study for DVT with occlusive thrombus involving the right femoral and popliteal veins as well as the veins of the calf, extending from the proximal- mid right thigh through to the ankle. Critical Value/emergent results were called by telephone at the time of interpretation on 02/25/2017 at 6:14 pm to Dr. Ileana RoupJAMES MCSHANE , who verbally acknowledged these results. Electronically Signed   By: Rise MuBenjamin  McClintock M.D.   On: 02/25/2017 18:18       Today   Subjective:   Brian Hampton  denies any chest pains or shortness of breath anxious to go home  Objective:   Blood pressure 138/88, pulse 81, temperature 97.6 F (36.4 C), resp. rate 15, height 5\' 5"  (1.651 m), weight 190 lb 11.2 oz (86.5 kg), SpO2 97 %.  .  Intake/Output Summary (Last 24 hours) at 03/05/17 1433 Last data filed at 03/05/17 0900  Gross per 24 hour  Intake              480 ml  Output             1000 ml  Net             -520 ml    Exam VITAL SIGNS: Blood pressure 138/88, pulse 81, temperature 97.6 F (36.4 C), resp. rate 15, height 5\' 5"  (1.651 m), weight 190 lb 11.2 oz (86.5 kg), SpO2 97 %.  GENERAL:  61 y.o.-year-old patient lying in the bed with no acute distress.  EYES: Pupils equal, round, reactive to light and accommodation. No scleral icterus. Extraocular muscles intact.  HEENT: Head atraumatic, normocephalic. Oropharynx and nasopharynx clear.  NECK:  Supple, no jugular venous  distention. No thyroid enlargement, no tenderness.  LUNGS: Normal breath sounds bilaterally, no wheezing, rales,rhonchi or crepitation. No use of accessory muscles of respiration.  CARDIOVASCULAR: S1, S2 normal. No murmurs, rubs, or gallops.  ABDOMEN: Soft, nontender, nondistended. Bowel sounds present. No organomegaly or mass.  EXTREMITIES: No pedal edema, cyanosis, or clubbing.  NEUROLOGIC: Cranial nerves II through XII are intact. Muscle strength 5/5 in all extremities. Sensation intact. Gait not checked.  PSYCHIATRIC: The patient is alert and oriented x 3.  SKIN: No obvious rash, lesion, or ulcer.   Data Review     CBC w Diff:  Lab Results  Component Value Date   WBC 9.1 03/03/2017   HGB 13.5 03/03/2017   HGB 12.5 (L) 01/14/2016   HCT 39.6 (L) 03/03/2017   HCT 35.3 (L) 01/14/2016   PLT 293 03/03/2017   PLT 215 01/14/2016   LYMPHOPCT 21 11/26/2016   BANDSPCT 2 01/21/2016   MONOPCT 6 11/26/2016   EOSPCT 2 11/26/2016   BASOPCT 1 11/26/2016   CMP:  Lab Results  Component Value Date   NA 141 03/03/2017   NA 145 10/02/2011   K 3.6 03/03/2017   K 3.9 10/02/2011   CL 106 03/03/2017   CL 111 (H) 10/02/2011   CO2 27 03/03/2017   CO2 22 10/02/2011   BUN 17 03/03/2017   BUN 16 10/02/2011   CREATININE 1.49 (H) 03/03/2017   CREATININE 0.97 10/02/2011   PROT 7.9 03/03/2017   PROT 8.2 10/02/2011   ALBUMIN 3.7 03/03/2017   ALBUMIN 4.2 10/02/2011   BILITOT 0.6 03/03/2017   BILITOT 0.3 10/02/2011   ALKPHOS 189 (H) 03/03/2017   ALKPHOS 187 (H) 10/02/2011   AST 46 (H) 03/03/2017   AST 28  10/02/2011   ALT 72 (H) 03/03/2017   ALT 19 10/02/2011  .  Micro Results No results found for this or any previous visit (from the past 240 hour(s)).      Code Status Orders        Start     Ordered   03/04/17 0406  Full code  Continuous     03/04/17 0405    Code Status History    Date Active Date Inactive Code Status Order ID Comments User Context   02/25/2017 11:52 PM  02/27/2017  6:06 PM DNR [id number]  Tonye Royalty, DO Inpatient   02/25/2017 10:27 PM 02/25/2017 11:52 PM Full Code [id number]  Tonye Royalty, DO Inpatient   12/21/2016  6:14 PM 12/24/2016  3:03 PM DNR [id number]  Kennedy Bucker, MD Inpatient   05/05/2016 10:17 PM 05/12/2016  6:20 PM DNR [id number]  Altamese Dilling, MD Inpatient   03/15/2016  5:42 PM 03/17/2016  6:59 PM DNR [id number]  Auburn Bilberry, MD ED   03/15/2016  5:29 PM 03/15/2016  5:42 PM Full Code [id number]  Auburn Bilberry, MD ED   01/30/2016 12:55 AM 02/03/2016 12:55 AM DNR [id number]  Gery Pray, MD Inpatient   01/13/2016  9:46 PM 01/16/2016  4:18 PM Full Code [id number]  Shaune Pollack, MD Inpatient   06/24/2015  7:13 AM 06/25/2015 12:27 PM Full Code [id number]  Milagros Loll, MD ED    Advance Directive Documentation     Most Recent Value  Type of Advance Directive  Living will  Pre-existing out of facility DNR order (yellow form or pink MOST form)  -  "MOST" Form in Place?  -          Follow-up Information    Mick Sell, MD Follow up in 1 week(s).   Specialty:  Infectious Diseases Contact information: [address] [place] [address] [phone]           Discharge Medications   Allergies as of 03/05/2017      Reactions   Aspirin Anaphylaxis   Bee Venom Anaphylaxis   Penicillins Anaphylaxis, Other (See Comments)   Has patient had a PCN reaction causing immediate rash, facial/tongue/throat swelling, SOB or lightheadedness with hypotension: Yes Has patient had a PCN reaction causing severe rash involving mucus membranes or skin necrosis: No Has patient had a PCN reaction that required hospitalization No Has patient had a PCN reaction occurring within the last 10 years: Yes If all of the above answers are "NO", then may proceed with Cephalosporin use.   Prednisone Anaphylaxis   Sulfa Antibiotics Anaphylaxis   Sulfasalazine Anaphylaxis   Theophyllines Anaphylaxis   Theophylline Swelling       Medication List    TAKE these medications   abacavir-dolutegravir-lamiVUDine 600-50-300 MG tablet Commonly known as:  TRIUMEQ Take 1 tablet by mouth daily after breakfast. Reported on 01/30/2016   albuterol 108 (90 Base) MCG/ACT inhaler Commonly known as:  PROVENTIL HFA;VENTOLIN HFA Inhale 2 puffs into the lungs every 6 (six) hours as needed for wheezing or shortness of breath.   albuterol (2.5 MG/3ML) 0.083% nebulizer solution Commonly known as:  PROVENTIL Take 2.5 mg by nebulization 3 (three) times daily.   atorvastatin 10 MG tablet Commonly known as:  LIPITOR Take 10 mg by mouth daily after breakfast.   CALCIUM 600+D 600-400 MG-UNIT tablet Generic drug:  Calcium Carbonate-Vitamin D Take 1 tablet by mouth daily after breakfast.   carvedilol 12.5 MG tablet Commonly known as:  COREG Take 12.5 mg by mouth  2 (two) times daily with a meal.   cetirizine 10 MG tablet Commonly known as:  ZYRTEC Take 10 mg by mouth daily as needed for allergies.   DULERA 200-5 MCG/ACT Aero Generic drug:  mometasone-formoterol Inhale 2 puffs into the lungs 3 (three) times daily.   enoxaparin 80 MG/0.8ML injection Commonly known as:  LOVENOX Inject 0.8 mLs (80 mg total) into the skin every 12 (twelve) hours.   EPIPEN 2-PAK 0.3 mg/0.3 mL Soaj injection Generic drug:  EPINEPHrine Inject 0.3 mg into the muscle once as needed (for severe allergic reaction).   escitalopram 20 MG tablet Commonly known as:  LEXAPRO Take 20 mg by mouth daily after breakfast.   furosemide 20 MG tablet Commonly known as:  LASIX Take 20 mg by mouth daily.   HYDROcodone-acetaminophen 5-325 MG tablet Commonly known as:  NORCO Take 1 tablet by mouth every 4 (four) hours as needed for moderate pain.   ketorolac 0.5 % ophthalmic solution Commonly known as:  ACULAR Place 1 drop into the left eye 4 (four) times daily.   levothyroxine 50 MCG tablet Commonly known as:  SYNTHROID, LEVOTHROID Take 1 tablet (50 mcg  total) by mouth daily before breakfast.   LINZESS 72 MCG capsule Generic drug:  linaclotide Take 72 mcg by mouth daily after breakfast.   magnesium oxide 400 MG tablet Commonly known as:  MAG-OX Take 400 mg by mouth daily after breakfast.   meloxicam 7.5 MG tablet Commonly known as:  MOBIC Take 7.5 mg by mouth daily as needed for pain.   mirtazapine 15 MG tablet Commonly known as:  REMERON Take 15 mg by mouth every evening.   multivitamin with minerals Tabs tablet Take 1 tablet by mouth daily. ONE-A-DAY MULTIVITAMIN 50+   nitroGLYCERIN 0.4 MG SL tablet Commonly known as:  NITROSTAT Place 0.4 mg under the tongue every 5 (five) minutes as needed for chest pain.   ofloxacin 0.3 % ophthalmic solution Commonly known as:  OCUFLOX Place 1 drop into the left eye 4 (four) times daily.   omeprazole 40 MG capsule Commonly known as:  PRILOSEC Take 40 mg by mouth daily after breakfast.   Potassium Chloride CR 8 MEQ Cpcr capsule CR Commonly known as:  MICRO-K Take 1 capsule by mouth daily after breakfast.   senna-docusate 8.6-50 MG tablet Commonly known as:  Senokot-S Take 1 tablet by mouth at bedtime as needed for mild constipation.   tiotropium 18 MCG inhalation capsule Commonly known as:  SPIRIVA Place 18 mcg into inhaler and inhale daily after breakfast.   traMADol 50 MG tablet Commonly known as:  ULTRAM Take 1 tablet (50 mg total) by mouth every 6 (six) hours as needed. What changed:  when to take this   zolpidem 5 MG tablet Commonly known as:  AMBIEN Take 5 mg by mouth at bedtime.          Total Time in preparing paper work, data evaluation and todays exam - 35 minutes  Auburn Bilberry M.D on 03/05/2017 at 2:33 PM  [facility]   Office  [phone]

## 2017-03-05 NOTE — Discharge Instructions (Signed)
[facility] - [place] at [facility] ° °DIET:  °Cardiac diet ° °DISCHARGE CONDITION:  °Stable ° °ACTIVITY:  °Activity as tolerated ° °OXYGEN:  °Home Oxygen: No. °  °Oxygen Delivery: room air ° °DISCHARGE LOCATION:  °home  ° ° °ADDITIONAL DISCHARGE INSTRUCTION: ° ° °If you experience worsening of your admission symptoms, develop shortness of breath, life threatening emergency, suicidal or homicidal thoughts you must seek medical attention immediately by calling 911 or calling your MD immediately  if symptoms less severe. ° °You Must read complete instructions/literature along with all the possible adverse reactions/side effects for all the Medicines you take and that have been prescribed to you. Take any new Medicines after you have completely understood and accpet all the possible adverse reactions/side effects.  ° °Please note ° °You were cared for by a hospitalist during your hospital stay. If you have any questions about your discharge medications or the care you received while you were in the hospital after you are discharged, you can call the unit and asked to speak with the hospitalist on call if the hospitalist that took care of you is not available. Once you are discharged, your primary care physician will handle any further medical issues. Please note that NO REFILLS for any discharge medications will be authorized once you are discharged, as it is imperative that you return to your primary care physician (or establish a relationship with a primary care physician if you do not have one) for your aftercare needs so that they can reassess your need for medications and monitor your lab values. ° ° °

## 2017-03-07 DIAGNOSIS — I82401 Acute embolism and thrombosis of unspecified deep veins of right lower extremity: Secondary | ICD-10-CM | POA: Diagnosis not present

## 2017-03-07 DIAGNOSIS — E1122 Type 2 diabetes mellitus with diabetic chronic kidney disease: Secondary | ICD-10-CM | POA: Diagnosis not present

## 2017-03-07 DIAGNOSIS — I2510 Atherosclerotic heart disease of native coronary artery without angina pectoris: Secondary | ICD-10-CM | POA: Diagnosis not present

## 2017-03-07 DIAGNOSIS — J439 Emphysema, unspecified: Secondary | ICD-10-CM | POA: Diagnosis not present

## 2017-03-07 DIAGNOSIS — N189 Chronic kidney disease, unspecified: Secondary | ICD-10-CM | POA: Diagnosis not present

## 2017-03-07 DIAGNOSIS — I129 Hypertensive chronic kidney disease with stage 1 through stage 4 chronic kidney disease, or unspecified chronic kidney disease: Secondary | ICD-10-CM | POA: Diagnosis not present

## 2017-03-07 LAB — FACTOR 5 LEIDEN

## 2017-03-08 DIAGNOSIS — K219 Gastro-esophageal reflux disease without esophagitis: Secondary | ICD-10-CM | POA: Diagnosis not present

## 2017-03-08 DIAGNOSIS — R079 Chest pain, unspecified: Secondary | ICD-10-CM | POA: Diagnosis not present

## 2017-03-08 DIAGNOSIS — I10 Essential (primary) hypertension: Secondary | ICD-10-CM | POA: Diagnosis not present

## 2017-03-08 DIAGNOSIS — R0782 Intercostal pain: Secondary | ICD-10-CM | POA: Diagnosis not present

## 2017-03-08 DIAGNOSIS — M79662 Pain in left lower leg: Secondary | ICD-10-CM | POA: Diagnosis not present

## 2017-03-08 DIAGNOSIS — M79669 Pain in unspecified lower leg: Secondary | ICD-10-CM | POA: Diagnosis not present

## 2017-03-08 DIAGNOSIS — E785 Hyperlipidemia, unspecified: Secondary | ICD-10-CM | POA: Diagnosis not present

## 2017-03-08 DIAGNOSIS — I2510 Atherosclerotic heart disease of native coronary artery without angina pectoris: Secondary | ICD-10-CM | POA: Diagnosis not present

## 2017-03-08 DIAGNOSIS — R0602 Shortness of breath: Secondary | ICD-10-CM | POA: Diagnosis not present

## 2017-03-09 DIAGNOSIS — E785 Hyperlipidemia, unspecified: Secondary | ICD-10-CM | POA: Diagnosis not present

## 2017-03-09 DIAGNOSIS — K219 Gastro-esophageal reflux disease without esophagitis: Secondary | ICD-10-CM | POA: Diagnosis not present

## 2017-03-09 DIAGNOSIS — R0602 Shortness of breath: Secondary | ICD-10-CM | POA: Diagnosis not present

## 2017-03-09 DIAGNOSIS — M79669 Pain in unspecified lower leg: Secondary | ICD-10-CM | POA: Diagnosis not present

## 2017-03-09 DIAGNOSIS — I10 Essential (primary) hypertension: Secondary | ICD-10-CM | POA: Diagnosis not present

## 2017-03-09 DIAGNOSIS — M79662 Pain in left lower leg: Secondary | ICD-10-CM | POA: Diagnosis not present

## 2017-03-09 DIAGNOSIS — I2510 Atherosclerotic heart disease of native coronary artery without angina pectoris: Secondary | ICD-10-CM | POA: Diagnosis not present

## 2017-03-09 DIAGNOSIS — R079 Chest pain, unspecified: Secondary | ICD-10-CM | POA: Diagnosis not present

## 2017-03-09 DIAGNOSIS — R0782 Intercostal pain: Secondary | ICD-10-CM | POA: Diagnosis not present

## 2017-03-10 DIAGNOSIS — F329 Major depressive disorder, single episode, unspecified: Secondary | ICD-10-CM | POA: Diagnosis not present

## 2017-03-10 DIAGNOSIS — J449 Chronic obstructive pulmonary disease, unspecified: Secondary | ICD-10-CM | POA: Diagnosis not present

## 2017-03-10 DIAGNOSIS — B20 Human immunodeficiency virus [HIV] disease: Secondary | ICD-10-CM | POA: Diagnosis not present

## 2017-03-10 DIAGNOSIS — M79669 Pain in unspecified lower leg: Secondary | ICD-10-CM | POA: Diagnosis not present

## 2017-03-10 DIAGNOSIS — E039 Hypothyroidism, unspecified: Secondary | ICD-10-CM | POA: Diagnosis not present

## 2017-03-10 DIAGNOSIS — I803 Phlebitis and thrombophlebitis of lower extremities, unspecified: Secondary | ICD-10-CM | POA: Diagnosis not present

## 2017-03-12 ENCOUNTER — Emergency Department: Payer: Medicare Other

## 2017-03-12 ENCOUNTER — Inpatient Hospital Stay
Admission: EM | Admit: 2017-03-12 | Discharge: 2017-03-15 | DRG: 299 | Disposition: A | Discharge status: Home Health | Payer: Medicare Other | Attending: Internal Medicine | Admitting: Internal Medicine

## 2017-03-12 ENCOUNTER — Encounter: Payer: Self-pay | Admitting: Emergency Medicine

## 2017-03-12 DIAGNOSIS — R0602 Shortness of breath: Secondary | ICD-10-CM | POA: Diagnosis not present

## 2017-03-12 DIAGNOSIS — R531 Weakness: Secondary | ICD-10-CM | POA: Diagnosis not present

## 2017-03-12 DIAGNOSIS — Z7901 Long term (current) use of anticoagulants: Secondary | ICD-10-CM

## 2017-03-12 DIAGNOSIS — R1310 Dysphagia, unspecified: Secondary | ICD-10-CM | POA: Diagnosis not present

## 2017-03-12 DIAGNOSIS — R29898 Other symptoms and signs involving the musculoskeletal system: Secondary | ICD-10-CM | POA: Diagnosis not present

## 2017-03-12 DIAGNOSIS — E1151 Type 2 diabetes mellitus with diabetic peripheral angiopathy without gangrene: Secondary | ICD-10-CM | POA: Diagnosis present

## 2017-03-12 DIAGNOSIS — Z95828 Presence of other vascular implants and grafts: Secondary | ICD-10-CM | POA: Diagnosis not present

## 2017-03-12 DIAGNOSIS — B1920 Unspecified viral hepatitis C without hepatic coma: Secondary | ICD-10-CM | POA: Diagnosis present

## 2017-03-12 DIAGNOSIS — I2510 Atherosclerotic heart disease of native coronary artery without angina pectoris: Secondary | ICD-10-CM | POA: Diagnosis not present

## 2017-03-12 DIAGNOSIS — I82409 Acute embolism and thrombosis of unspecified deep veins of unspecified lower extremity: Secondary | ICD-10-CM | POA: Diagnosis not present

## 2017-03-12 DIAGNOSIS — Z91030 Bee allergy status: Secondary | ICD-10-CM

## 2017-03-12 DIAGNOSIS — I82491 Acute embolism and thrombosis of other specified deep vein of right lower extremity: Secondary | ICD-10-CM | POA: Diagnosis present

## 2017-03-12 DIAGNOSIS — R5383 Other fatigue: Secondary | ICD-10-CM | POA: Diagnosis not present

## 2017-03-12 DIAGNOSIS — R200 Anesthesia of skin: Secondary | ICD-10-CM | POA: Diagnosis not present

## 2017-03-12 DIAGNOSIS — Z888 Allergy status to other drugs, medicaments and biological substances status: Secondary | ICD-10-CM | POA: Diagnosis not present

## 2017-03-12 DIAGNOSIS — R0781 Pleurodynia: Secondary | ICD-10-CM | POA: Diagnosis not present

## 2017-03-12 DIAGNOSIS — M79604 Pain in right leg: Secondary | ICD-10-CM | POA: Diagnosis not present

## 2017-03-12 DIAGNOSIS — E0590 Thyrotoxicosis, unspecified without thyrotoxic crisis or storm: Secondary | ICD-10-CM | POA: Diagnosis present

## 2017-03-12 DIAGNOSIS — Z86711 Personal history of pulmonary embolism: Secondary | ICD-10-CM | POA: Diagnosis not present

## 2017-03-12 DIAGNOSIS — Z66 Do not resuscitate: Secondary | ICD-10-CM | POA: Diagnosis present

## 2017-03-12 DIAGNOSIS — E876 Hypokalemia: Secondary | ICD-10-CM | POA: Diagnosis not present

## 2017-03-12 DIAGNOSIS — I2699 Other pulmonary embolism without acute cor pulmonale: Secondary | ICD-10-CM | POA: Diagnosis not present

## 2017-03-12 DIAGNOSIS — I639 Cerebral infarction, unspecified: Secondary | ICD-10-CM | POA: Diagnosis not present

## 2017-03-12 DIAGNOSIS — B20 Human immunodeficiency virus [HIV] disease: Secondary | ICD-10-CM | POA: Diagnosis present

## 2017-03-12 DIAGNOSIS — Z880 Allergy status to penicillin: Secondary | ICD-10-CM

## 2017-03-12 DIAGNOSIS — I10 Essential (primary) hypertension: Secondary | ICD-10-CM | POA: Diagnosis present

## 2017-03-12 DIAGNOSIS — Z886 Allergy status to analgesic agent status: Secondary | ICD-10-CM | POA: Diagnosis not present

## 2017-03-12 DIAGNOSIS — R109 Unspecified abdominal pain: Secondary | ICD-10-CM

## 2017-03-12 DIAGNOSIS — Z87891 Personal history of nicotine dependence: Secondary | ICD-10-CM | POA: Diagnosis not present

## 2017-03-12 DIAGNOSIS — Z79899 Other long term (current) drug therapy: Secondary | ICD-10-CM

## 2017-03-12 DIAGNOSIS — J441 Chronic obstructive pulmonary disease with (acute) exacerbation: Secondary | ICD-10-CM | POA: Diagnosis present

## 2017-03-12 DIAGNOSIS — R059 Cough, unspecified: Secondary | ICD-10-CM

## 2017-03-12 DIAGNOSIS — F329 Major depressive disorder, single episode, unspecified: Secondary | ICD-10-CM | POA: Diagnosis present

## 2017-03-12 DIAGNOSIS — Z881 Allergy status to other antibiotic agents status: Secondary | ICD-10-CM | POA: Diagnosis not present

## 2017-03-12 DIAGNOSIS — G459 Transient cerebral ischemic attack, unspecified: Secondary | ICD-10-CM

## 2017-03-12 DIAGNOSIS — I69351 Hemiplegia and hemiparesis following cerebral infarction affecting right dominant side: Secondary | ICD-10-CM | POA: Diagnosis not present

## 2017-03-12 DIAGNOSIS — R05 Cough: Secondary | ICD-10-CM

## 2017-03-12 DIAGNOSIS — K219 Gastro-esophageal reflux disease without esophagitis: Secondary | ICD-10-CM | POA: Diagnosis not present

## 2017-03-12 DIAGNOSIS — I82501 Chronic embolism and thrombosis of unspecified deep veins of right lower extremity: Secondary | ICD-10-CM | POA: Diagnosis present

## 2017-03-12 DIAGNOSIS — I252 Old myocardial infarction: Secondary | ICD-10-CM | POA: Diagnosis not present

## 2017-03-12 DIAGNOSIS — G458 Other transient cerebral ischemic attacks and related syndromes: Secondary | ICD-10-CM | POA: Diagnosis not present

## 2017-03-12 DIAGNOSIS — I6521 Occlusion and stenosis of right carotid artery: Secondary | ICD-10-CM | POA: Diagnosis not present

## 2017-03-12 DIAGNOSIS — Z825 Family history of asthma and other chronic lower respiratory diseases: Secondary | ICD-10-CM

## 2017-03-12 DIAGNOSIS — I509 Heart failure, unspecified: Secondary | ICD-10-CM | POA: Diagnosis not present

## 2017-03-12 DIAGNOSIS — I82401 Acute embolism and thrombosis of unspecified deep veins of right lower extremity: Secondary | ICD-10-CM | POA: Diagnosis not present

## 2017-03-12 DIAGNOSIS — I749 Embolism and thrombosis of unspecified artery: Secondary | ICD-10-CM | POA: Diagnosis not present

## 2017-03-12 DIAGNOSIS — J449 Chronic obstructive pulmonary disease, unspecified: Secondary | ICD-10-CM | POA: Diagnosis not present

## 2017-03-12 DIAGNOSIS — D6851 Activated protein C resistance: Secondary | ICD-10-CM | POA: Diagnosis not present

## 2017-03-12 DIAGNOSIS — I824Z1 Acute embolism and thrombosis of unspecified deep veins of right distal lower extremity: Secondary | ICD-10-CM | POA: Diagnosis not present

## 2017-03-12 DIAGNOSIS — R079 Chest pain, unspecified: Secondary | ICD-10-CM | POA: Diagnosis not present

## 2017-03-12 DIAGNOSIS — Z86718 Personal history of other venous thrombosis and embolism: Secondary | ICD-10-CM | POA: Diagnosis not present

## 2017-03-12 DIAGNOSIS — M2550 Pain in unspecified joint: Secondary | ICD-10-CM | POA: Diagnosis not present

## 2017-03-12 DIAGNOSIS — J302 Other seasonal allergic rhinitis: Secondary | ICD-10-CM | POA: Diagnosis present

## 2017-03-12 LAB — BASIC METABOLIC PANEL
Anion gap: 6 (ref 5–15)
BUN: 15 mg/dL (ref 6–20)
CHLORIDE: 110 mmol/L (ref 101–111)
CO2: 24 mmol/L (ref 22–32)
Calcium: 8.7 mg/dL — ABNORMAL LOW (ref 8.9–10.3)
Creatinine, Ser: 1.16 mg/dL (ref 0.61–1.24)
GFR calc Af Amer: >60 mL/min (ref >60)
GFR calc non Af Amer: >60 mL/min (ref >60)
GLUCOSE: 91 mg/dL (ref 65–99)
POTASSIUM: 3.4 mmol/L — AB (ref 3.5–5.1)
Sodium: 140 mmol/L (ref 135–145)

## 2017-03-12 LAB — APTT: aPTT: 33 seconds (ref 24–36)

## 2017-03-12 LAB — CBC
HEMATOCRIT: 35.3 % — AB (ref 40.0–52.0)
HEMOGLOBIN: 12 g/dL — AB (ref 13.0–18.0)
MCH: 33.9 pg (ref 26.0–34.0)
MCHC: 34.1 g/dL (ref 32.0–36.0)
MCV: 99.5 fL (ref 80.0–100.0)
Platelets: 308 10*3/uL (ref 150–440)
RBC: 3.55 MIL/uL — ABNORMAL LOW (ref 4.40–5.90)
RDW: 14.7 % — ABNORMAL HIGH (ref 11.5–14.5)
WBC: 6.7 10*3/uL (ref 3.8–10.6)

## 2017-03-12 LAB — HEPARIN LEVEL (UNFRACTIONATED): Heparin Unfractionated: 2.12 IU/mL — ABNORMAL HIGH (ref 0.30–0.70)

## 2017-03-12 LAB — PROTIME-INR
INR: 0.98
Prothrombin Time: 13 seconds (ref 11.4–15.2)

## 2017-03-12 LAB — TROPONIN I: Troponin I: <0.03 ng/mL (ref <0.03)

## 2017-03-12 MED ORDER — LEVOTHYROXINE SODIUM 50 MCG PO TABS
50.0000 ug | ORAL_TABLET | Freq: Every day | ORAL | Status: DC
  Administered 2017-03-13 – 2017-03-15 (×3): 50 ug via ORAL
  Filled 2017-03-12 (×3): qty 1

## 2017-03-12 MED ORDER — HYDROCODONE-ACETAMINOPHEN 5-325 MG PO TABS
1.0000 | ORAL_TABLET | ORAL | Status: DC | PRN
  Administered 2017-03-12 (×2): 1 via ORAL
  Filled 2017-03-12 (×2): qty 1

## 2017-03-12 MED ORDER — MAGNESIUM OXIDE 400 (241.3 MG) MG PO TABS: 400.0000 mg | ORAL_TABLET | Freq: Every day | ORAL | Status: DC

## 2017-03-12 MED ORDER — ALBUTEROL SULFATE (2.5 MG/3ML) 0.083% IN NEBU
2.5000 mg | INHALATION_SOLUTION | Freq: Three times a day (TID) | RESPIRATORY_TRACT | Status: DC
  Administered 2017-03-12 – 2017-03-13 (×4): 2.5 mg via RESPIRATORY_TRACT
  Filled 2017-03-12 (×3): qty 3

## 2017-03-12 MED ORDER — ABACAVIR-DOLUTEGRAVIR-LAMIVUD 600-50-300 MG PO TABS
1.0000 | ORAL_TABLET | Freq: Every day | ORAL | Status: DC
  Administered 2017-03-12 – 2017-03-15 (×4): 1 via ORAL
  Filled 2017-03-12 (×4): qty 1

## 2017-03-12 MED ORDER — HEPARIN BOLUS VIA INFUSION
4000.0000 [IU] | Freq: Once | INTRAVENOUS | Status: AC
  Administered 2017-03-12: 4000 [IU] via INTRAVENOUS
  Filled 2017-03-12: qty 4000

## 2017-03-12 MED ORDER — PANTOPRAZOLE SODIUM 40 MG PO TBEC
40.0000 mg | DELAYED_RELEASE_TABLET | Freq: Every day | ORAL | Status: DC
  Administered 2017-03-12 – 2017-03-13 (×2): 40 mg via ORAL
  Filled 2017-03-12 (×2): qty 1

## 2017-03-12 MED ORDER — CALCIUM CARBONATE-VITAMIN D 600-400 MG-UNIT PO TABS: 1.0000 | ORAL_TABLET | Freq: Every day | ORAL | Status: DC

## 2017-03-12 MED ORDER — HYDROCODONE-ACETAMINOPHEN 5-325 MG PO TABS
2.0000 | ORAL_TABLET | Freq: Once | ORAL | Status: AC
  Administered 2017-03-12: 2 via ORAL
  Filled 2017-03-12: qty 2

## 2017-03-12 MED ORDER — HEPARIN (PORCINE) IN NACL 100-0.45 UNIT/ML-% IJ SOLN
1400.0000 [IU]/h | INTRAMUSCULAR | Status: DC
  Administered 2017-03-12: 1400 [IU]/h via INTRAVENOUS
  Filled 2017-03-12 (×2): qty 250

## 2017-03-12 MED ORDER — MOMETASONE FURO-FORMOTEROL FUM 200-5 MCG/ACT IN AERO: 2.0000 | INHALATION_SPRAY | Freq: Three times a day (TID) | RESPIRATORY_TRACT | Status: DC

## 2017-03-12 MED ORDER — MAGNESIUM OXIDE 400 MG PO TABS: 400.0000 mg | ORAL_TABLET | Freq: Every day | ORAL | Status: DC

## 2017-03-12 MED ORDER — ALBUTEROL SULFATE (2.5 MG/3ML) 0.083% IN NEBU
INHALATION_SOLUTION | RESPIRATORY_TRACT | Status: AC
  Administered 2017-03-12: 09:00:00
  Filled 2017-03-12: qty 3

## 2017-03-12 MED ORDER — ALBUTEROL SULFATE (2.5 MG/3ML) 0.083% IN NEBU: 2.5000 mg | INHALATION_SOLUTION | Freq: Four times a day (QID) | RESPIRATORY_TRACT | Status: DC | PRN

## 2017-03-12 MED ORDER — ACETAMINOPHEN 325 MG PO TABS: 650.0000 mg | ORAL_TABLET | Freq: Four times a day (QID) | ORAL | Status: DC | PRN

## 2017-03-12 MED ORDER — SODIUM CHLORIDE 0.9 % IV SOLN: 250.0000 mL | INTRAVENOUS | Status: DC | PRN

## 2017-03-12 MED ORDER — ALBUTEROL SULFATE HFA 108 (90 BASE) MCG/ACT IN AERS: 2.0000 | INHALATION_SPRAY | Freq: Four times a day (QID) | RESPIRATORY_TRACT | Status: DC | PRN

## 2017-03-12 MED ORDER — ONDANSETRON HCL 4 MG/2ML IJ SOLN: 4.0000 mg | Freq: Four times a day (QID) | INTRAMUSCULAR | Status: DC | PRN

## 2017-03-12 MED ORDER — OXYCODONE HCL 5 MG PO TABS
10.0000 mg | ORAL_TABLET | ORAL | Status: DC | PRN
  Administered 2017-03-12 (×2): 10 mg via ORAL
  Filled 2017-03-12 (×2): qty 2

## 2017-03-12 MED ORDER — ESCITALOPRAM OXALATE 20 MG PO TABS
20.0000 mg | ORAL_TABLET | Freq: Every day | ORAL | Status: DC
  Administered 2017-03-12 – 2017-03-15 (×4): 20 mg via ORAL
  Filled 2017-03-12 (×4): qty 1

## 2017-03-12 MED ORDER — ATORVASTATIN CALCIUM 10 MG PO TABS
10.0000 mg | ORAL_TABLET | Freq: Every day | ORAL | Status: DC
  Administered 2017-03-12 – 2017-03-15 (×4): 10 mg via ORAL
  Filled 2017-03-12 (×4): qty 1

## 2017-03-12 MED ORDER — MAGNESIUM OXIDE 400 (241.3 MG) MG PO TABS
400.0000 mg | ORAL_TABLET | Freq: Every day | ORAL | Status: DC
  Administered 2017-03-12 – 2017-03-15 (×4): 400 mg via ORAL
  Filled 2017-03-12 (×4): qty 1

## 2017-03-12 MED ORDER — ZOLPIDEM TARTRATE 5 MG PO TABS
5.0000 mg | ORAL_TABLET | Freq: Every day | ORAL | Status: DC
  Administered 2017-03-12 – 2017-03-14 (×3): 5 mg via ORAL
  Filled 2017-03-12 (×3): qty 1

## 2017-03-12 MED ORDER — MIRTAZAPINE 15 MG PO TABS
15.0000 mg | ORAL_TABLET | Freq: Every evening | ORAL | Status: DC
  Administered 2017-03-12 – 2017-03-14 (×2): 15 mg via ORAL
  Filled 2017-03-12 (×2): qty 1

## 2017-03-12 MED ORDER — MOMETASONE FURO-FORMOTEROL FUM 200-5 MCG/ACT IN AERO
2.0000 | INHALATION_SPRAY | Freq: Two times a day (BID) | RESPIRATORY_TRACT | Status: DC
  Administered 2017-03-12 – 2017-03-15 (×7): 2 via RESPIRATORY_TRACT
  Filled 2017-03-12: qty 8.8

## 2017-03-12 MED ORDER — OFLOXACIN 0.3 % OP SOLN
1.0000 [drp] | Freq: Four times a day (QID) | OPHTHALMIC | Status: DC
  Administered 2017-03-12 – 2017-03-15 (×13): 1 [drp] via OPHTHALMIC
  Filled 2017-03-12: qty 5

## 2017-03-12 MED ORDER — DABIGATRAN ETEXILATE MESYLATE 150 MG PO CAPS: 150.0000 mg | ORAL_CAPSULE | Freq: Once | ORAL | Status: DC

## 2017-03-12 MED ORDER — POTASSIUM CHLORIDE CRYS ER 10 MEQ PO TBCR
10.0000 meq | EXTENDED_RELEASE_TABLET | Freq: Every day | ORAL | Status: DC
  Administered 2017-03-12 – 2017-03-15 (×4): 10 meq via ORAL
  Filled 2017-03-12 (×4): qty 1

## 2017-03-12 MED ORDER — SODIUM CHLORIDE 0.9% FLUSH: 3.0000 mL | INTRAVENOUS | Status: DC | PRN

## 2017-03-12 MED ORDER — ACETAMINOPHEN 650 MG RE SUPP: 650.0000 mg | Freq: Four times a day (QID) | RECTAL | Status: DC | PRN

## 2017-03-12 MED ORDER — TIOTROPIUM BROMIDE MONOHYDRATE 18 MCG IN CAPS
18.0000 ug | ORAL_CAPSULE | Freq: Every day | RESPIRATORY_TRACT | Status: DC
  Administered 2017-03-12 – 2017-03-13 (×2): 18 ug via RESPIRATORY_TRACT
  Filled 2017-03-12: qty 5

## 2017-03-12 MED ORDER — CALCIUM CARBONATE-VITAMIN D 500-200 MG-UNIT PO TABS: 1.0000 | ORAL_TABLET | Freq: Every day | ORAL | Status: DC

## 2017-03-12 MED ORDER — ONDANSETRON HCL 4 MG PO TABS: 4.0000 mg | ORAL_TABLET | Freq: Four times a day (QID) | ORAL | Status: DC | PRN

## 2017-03-12 MED ORDER — ENOXAPARIN SODIUM 100 MG/ML ~~LOC~~ SOLN
1.0000 mg/kg | Freq: Two times a day (BID) | SUBCUTANEOUS | Status: DC
  Administered 2017-03-12 (×2): 85 mg via SUBCUTANEOUS
  Filled 2017-03-12 (×5): qty 1

## 2017-03-12 MED ORDER — CALCIUM CARBONATE-VITAMIN D 500-200 MG-UNIT PO TABS
1.0000 | ORAL_TABLET | Freq: Every day | ORAL | Status: DC
  Administered 2017-03-12 – 2017-03-15 (×4): 1 via ORAL
  Filled 2017-03-12 (×4): qty 1

## 2017-03-12 MED ORDER — SODIUM CHLORIDE 0.9% FLUSH
3.0000 mL | Freq: Two times a day (BID) | INTRAVENOUS | Status: DC
  Administered 2017-03-12 – 2017-03-15 (×4): 3 mL via INTRAVENOUS

## 2017-03-12 MED ORDER — CARVEDILOL 12.5 MG PO TABS
12.5000 mg | ORAL_TABLET | Freq: Two times a day (BID) | ORAL | Status: DC
  Administered 2017-03-12 – 2017-03-15 (×6): 12.5 mg via ORAL
  Filled 2017-03-12 (×6): qty 1

## 2017-03-12 MED ORDER — POLYETHYLENE GLYCOL 3350 17 G PO PACK
17.0000 g | PACK | Freq: Every day | ORAL | Status: DC
  Administered 2017-03-13 – 2017-03-15 (×3): 17 g via ORAL
  Filled 2017-03-12 (×3): qty 1

## 2017-03-12 MED ORDER — MORPHINE SULFATE (PF) 2 MG/ML IV SOLN
2.0000 mg | INTRAVENOUS | Status: DC | PRN
  Administered 2017-03-12 – 2017-03-13 (×2): 2 mg via INTRAVENOUS
  Filled 2017-03-12 (×2): qty 1

## 2017-03-12 MED ORDER — SENNOSIDES-DOCUSATE SODIUM 8.6-50 MG PO TABS: 1.0000 | ORAL_TABLET | Freq: Every evening | ORAL | Status: DC | PRN

## 2017-03-12 MED ORDER — FUROSEMIDE 20 MG PO TABS
20.0000 mg | ORAL_TABLET | Freq: Every day | ORAL | Status: DC
  Administered 2017-03-12 – 2017-03-14 (×3): 20 mg via ORAL
  Filled 2017-03-12 (×3): qty 1

## 2017-03-12 MED ORDER — KETOROLAC TROMETHAMINE 0.5 % OP SOLN
1.0000 [drp] | Freq: Four times a day (QID) | OPHTHALMIC | Status: DC
  Administered 2017-03-12 – 2017-03-15 (×13): 1 [drp] via OPHTHALMIC
  Filled 2017-03-12: qty 3

## 2017-03-12 MED ORDER — LINACLOTIDE 72 MCG PO CAPS
72.0000 ug | ORAL_CAPSULE | Freq: Every day | ORAL | Status: DC
  Administered 2017-03-12 – 2017-03-15 (×4): 72 ug via ORAL
  Filled 2017-03-12 (×4): qty 1

## 2017-03-12 MED ORDER — NITROGLYCERIN 0.4 MG SL SUBL: 0.4000 mg | SUBLINGUAL_TABLET | SUBLINGUAL | Status: DC | PRN

## 2017-03-12 NOTE — Progress Notes (Addendum)
ANTICOAGULATION CONSULT NOTE - Initial Consult  Pharmacy Consult for enoxaparin Indication: DVT and hx of PE  Allergies  Allergen Reactions  . Aspirin Anaphylaxis  . Bee Venom Anaphylaxis  . Penicillins Anaphylaxis and Other (See Comments)    Has patient had a PCN reaction causing immediate rash, facial/tongue/throat swelling, SOB or lightheadedness with hypotension: Yes Has patient had a PCN reaction causing severe rash involving mucus membranes or skin necrosis: No Has patient had a PCN reaction that required hospitalization No Has patient had a PCN reaction occurring within the last 10 years: Yes If all of the above answers are "NO", then may proceed with Cephalosporin use.  . Prednisone Anaphylaxis  . Sulfa Antibiotics Anaphylaxis  . Sulfasalazine Anaphylaxis  . Theophyllines Anaphylaxis  . Theophylline Swelling    Patient Measurements: Height: 5\' 5"  (165.1 cm) Weight: 192 lb (87.1 kg) IBW/kg (Calculated) : 61.5   Vital Signs: Temp: 98 F (36.7 C) (08/04 0820) Temp Source: Oral (08/04 0820) BP: 146/87 (08/04 0820) Pulse Rate: 75 (08/04 0820)  Labs:  Recent Labs  03/12/17 0321 03/12/17 0653  HGB 12.0*  --   HCT 35.3*  --   PLT 308  --   APTT 33  --   LABPROT 13.0  --   INR 0.98  --   HEPARINUNFRC  --  2.12*  CREATININE 1.16  --   TROPONINI <0.03  --     Estimated Creatinine Clearance: 69.5 mL/min (by C-G formula based on SCr of 1.16 mg/dL).    Assessment: 61 yo male with hx of PE and new DVT initially on heparin drip transitioning to enoxaparin. (Enoxaparin PTA). Per protocol, enoxaparin to start one hour after discontinuing heparin drip.   Goal of Therapy:  Anti-Xa level 0.6-1 units/ml 4hrs after LMWH dose given Monitor platelets by anticoagulation protocol: Yes   Plan:  Lovenox 1 mg/kg SQ q12h to start one hour after heparin drip has been stopped - talked with RN and informed her of plan.  Per MD request, checking factor anti-Xa level 4h after  first dose (see consult) SCr and CBC at least every three days  Discussed with Dr. Amado CoeGouru that recommendation is to check anti-Xa level 4h after third dose of enoxaparin. Per Dr. Amado CoeGouru, she is working with Dr. Smith Robertao to determine anticoagulant dose and which anticoagulant to d/c pt home on. If questions arise on how to adjust dose based on anti-Xa level, please ask Dr. Amado CoeGouru.   Pharmacy will continue to follow.  Marty HeckWang, Namiko Pritts L 03/12/2017,11:12 AM   Addendum MD called back to check anti-Xa tomorrow after 3rd dose.  Will retime lab.   Crist FatHannah Fumiko Cham, PharmD, BCPS Clinical Pharmacist 03/12/2017 12:46 PM

## 2017-03-12 NOTE — Progress Notes (Addendum)
ANTICOAGULATION CONSULT NOTE - Initial Consult  Pharmacy Consult for heparin Indication: DVT and h/o PE   Allergies  Allergen Reactions  . Aspirin Anaphylaxis  . Bee Venom Anaphylaxis  . Penicillins Anaphylaxis and Other (See Comments)    Has patient had a PCN reaction causing immediate rash, facial/tongue/throat swelling, SOB or lightheadedness with hypotension: Yes Has patient had a PCN reaction causing severe rash involving mucus membranes or skin necrosis: No Has patient had a PCN reaction that required hospitalization No Has patient had a PCN reaction occurring within the last 10 years: Yes If all of the above answers are "NO", then may proceed with Cephalosporin use.  . Prednisone Anaphylaxis  . Sulfa Antibiotics Anaphylaxis  . Sulfasalazine Anaphylaxis  . Theophyllines Anaphylaxis  . Theophylline Swelling    Patient Measurements: Height: 5\' 5"  (165.1 cm) Weight: 192 lb (87.1 kg) IBW/kg (Calculated) : 61.5 Heparin Dosing Weight: 79.9 kg  Vital Signs: Temp: 98.2 F (36.8 C) (08/04 0311) Temp Source: Oral (08/04 0311) BP: 140/88 (08/04 0600) Pulse Rate: 69 (08/04 0600)  Labs:  Recent Labs  03/12/17 0321  HGB 12.0*  HCT 35.3*  PLT 308  LABPROT 13.0  INR 0.98  CREATININE 1.16  TROPONINI <0.03    Estimated Creatinine Clearance: 69.5 mL/min (by C-G formula based on SCr of 1.16 mg/dL).   Medical History: Past Medical History:  Diagnosis Date  . Anemia   . Asthma   . Cataracts, bilateral    worse in Rt eye  . Collagen vascular disease (HCC)   . COPD (chronic obstructive pulmonary disease) (HCC)   . Coronary artery disease   . Depression   . DVT (deep venous thrombosis) (HCC)   . Dysrhythmia   . Emphysema/COPD (HCC)   . Environmental and seasonal allergies   . H/O blood clots   . Heart murmur   . HIV (human immunodeficiency virus infection) (HCC)   . Hypertension   . Leaky heart valve    x 3  . Lung mass   . Myocardial infarction (HCC)    in  2000  . Pneumonia    year ago  . Pulmonary emboli (HCC)   . Type 2 diabetes mellitus (HCC)     Medications:  Scheduled:    Assessment: Patient admitted for RLE pain w/ DVT dx 2 weeks ago.  Patient is on lovenox 80 mg bid at home and has been on warfarin w/ an IVC filter. Patient apparently has failed m/t anticoagulant therapies. US of LE shows DVT. Being started on heparin  Goal of Therapy:  Heparin level 0.3-0.7 units/ml Monitor platelets by anticoagulation protocol: Yes   Plan:  Give 4000 units bolus x 1  Will start heparin drip @ 1400 units/hr. Will check HL @ 1230. Baseline labs ordered.  Thomasene Rippleavid Ritu Gagliardo, PharmD, BCPS Clinical Pharmacist 03/12/2017

## 2017-03-12 NOTE — ED Triage Notes (Signed)
Pt to tx room via EMS from home, report hospitalized 2 weeks ago for DVT in right leg, on lovenox at home, pt report increased pain to right upper leg and lower abd, bruising noted around abdomen, reports it's site for lovenox injections.  Pt reports SOB as well.

## 2017-03-12 NOTE — Progress Notes (Signed)
[facility] [place] [facility]   PATIENT NAME: Brian Hampton    MR#:  [id number]  DATE OF BIRTH:  [birth year]  SUBJECTIVE:  CHIEF COMPLAINT:  Patient is complaining of right leg pain and requesting stronger pain medicine  REVIEW OF SYSTEMS:  CONSTITUTIONAL: No fever, fatigue or weakness.  EYES: No blurred or double vision.  EARS, NOSE, AND THROAT: No tinnitus or ear pain.  RESPIRATORY: No cough, shortness of breath, wheezing or hemoptysis.  CARDIOVASCULAR: No chest pain, orthopnea, edema.  GASTROINTESTINAL: No nausea, vomiting, diarrhea or abdominal pain.  GENITOURINARY: No dysuria, hematuria.  ENDOCRINE: No polyuria, nocturia,  HEMATOLOGY: No anemia, easy bruising or bleeding SKIN: No rash or lesion. MUSCULOSKELETAL: Right leg pain No joint pain or arthritis.   NEUROLOGIC: No tingling, numbness, weakness.  PSYCHIATRY: No anxiety or depression.   DRUG ALLERGIES:   Allergies  Allergen Reactions  . Aspirin Anaphylaxis  . Bee Venom Anaphylaxis  . Penicillins Anaphylaxis and Other (See Comments)    Has patient had a PCN reaction causing immediate rash, facial/tongue/throat swelling, SOB or lightheadedness with hypotension: Yes Has patient had a PCN reaction causing severe rash involving mucus membranes or skin necrosis: No Has patient had a PCN reaction that required hospitalization No Has patient had a PCN reaction occurring within the last 10 years: Yes If all of the above answers are "NO", then may proceed with Cephalosporin use.  . Prednisone Anaphylaxis  . Sulfa Antibiotics Anaphylaxis  . Sulfasalazine Anaphylaxis  . Theophyllines Anaphylaxis  . Theophylline Swelling    VITALS:  Blood pressure (!) 146/87, pulse 75, temperature 98 F (36.7 C), temperature source Oral, resp. rate 18, height 5\' 5"  (1.651 m), weight 87.1 kg (192 lb), SpO2 100 %.  PHYSICAL EXAMINATION:  GENERAL:  61 y.o.-year-old patient lying in the bed with no acute distress.   EYES: Pupils equal, round, reactive to light and accommodation. No scleral icterus. Extraocular muscles intact.  HEENT: Head atraumatic, normocephalic. Oropharynx and nasopharynx clear.  NECK:  Supple, no jugular venous distention. No thyroid enlargement, no tenderness.  LUNGS: Normal breath sounds bilaterally, no wheezing, rales,rhonchi or crepitation. No use of accessory muscles of respiration.  CARDIOVASCULAR: S1, S2 normal. No murmurs, rubs, or gallops.  ABDOMEN: Soft, nontender, nondistended. Bowel sounds present. No organomegaly or mass.  EXTREMITIES: Right calf tenderness is present , right lower extremity is edematous No pedal edema, cyanosis, or clubbing.  NEUROLOGIC: Cranial nerves II through XII are intact. Muscle strength 5/5 in all extremities. Sensation intact. Gait not checked.  PSYCHIATRIC: The patient is alert and oriented x 3.  SKIN: No obvious rash, lesion, or ulcer.    LABORATORY PANEL:   CBC  Recent Labs Lab 03/12/17 0321  WBC 6.7  HGB 12.0*  HCT 35.3*  PLT 308   ------------------------------------------------------------------------------------------------------------------  Chemistries   Recent Labs Lab 03/12/17 0321  NA 140  K 3.4*  CL 110  CO2 24  GLUCOSE 91  BUN 15  CREATININE 1.16  CALCIUM 8.7*   ------------------------------------------------------------------------------------------------------------------  Cardiac Enzymes  Recent Labs Lab 03/12/17 0321  TROPONINI <0.03   ------------------------------------------------------------------------------------------------------------------  RADIOLOGY:  [place] Venous Img Lower Bilateral  Result Date: 03/12/2017 CLINICAL DATA:  Hospitalized 2 weeks ago for right leg DVT. Assess deep venous thrombosis. Initial encounter. EXAM: BILATERAL LOWER EXTREMITY VENOUS DOPPLER ULTRASOUND TECHNIQUE: Gray-scale sonography with graded compression, as well as color Doppler and duplex ultrasound were  performed to evaluate the lower extremity deep venous systems from the level of the common  femoral vein and including the common femoral, femoral, profunda femoral, popliteal and calf veins including the posterior tibial, peroneal and gastrocnemius veins when visible. The superficial great saphenous vein was also interrogated. Spectral Doppler was utilized to evaluate flow at rest and with distal augmentation maneuvers in the common femoral, femoral and popliteal veins. COMPARISON:  Right lower extremity venous Doppler ultrasound performed 02/25/2017 FINDINGS: RIGHT LOWER EXTREMITY Common Femoral Vein: No evidence of thrombus. Normal compressibility, respiratory phasicity and response to augmentation. Saphenofemoral Junction: No evidence of thrombus. Normal compressibility and flow on color Doppler imaging. Profunda Femoral Vein: Nonocclusive thrombus is noted within the profunda femoral vein, new from the prior study. Femoral Vein: Occlusive thrombus is noted within the femoral vein, similar to the prior study. Popliteal Vein: Occlusive thrombus noted within the popliteal vein. Calf Veins: Nonocclusive thrombus is noted within the posterior tibial vein. The peroneal vein is patent, new from the prior study. Superficial Great Saphenous Vein: No evidence of thrombus. Normal compressibility and flow on color Doppler imaging. Venous Reflux:  None. Other Findings:  None. LEFT LOWER EXTREMITY Common Femoral Vein: No evidence of thrombus. Normal compressibility, respiratory phasicity and response to augmentation. Saphenofemoral Junction: No evidence of thrombus. Normal compressibility and flow on color Doppler imaging. Profunda Femoral Vein: No evidence of thrombus. Normal compressibility and flow on color Doppler imaging. Femoral Vein: No evidence of thrombus. Normal compressibility, respiratory phasicity and response to augmentation. Popliteal Vein: No evidence of thrombus. Normal compressibility, respiratory phasicity  and response to augmentation. Calf Veins: No evidence of thrombus. Normal compressibility and flow on color Doppler imaging. Superficial Great Saphenous Vein: No evidence of thrombus. Normal compressibility and flow on color Doppler imaging. Venous Reflux:  None. Other Findings:  None. IMPRESSION: 1. Deep venous thrombosis noted within the right profunda femoral vein, femoral vein, popliteal vein and posterior tibial vein, with occlusion at the mid to distal femoral vein and proximal popliteal vein. This has progressed slightly proximally since the prior study. 2. No evidence of DVT at the left lower extremity. These results were called by telephone at the time of interpretation on 03/12/2017 at 5:40 am to Dr. Merrily BrittleNEIL RIFENBARK, who verbally acknowledged these results. Electronically Signed   By: Roanna RaiderJeffery  Chang M.D.   On: 03/12/2017 05:42    EKG:   Orders placed or performed during the hospital encounter of 03/12/17  . ED EKG within 10 minutes  . ED EKG within 10 minutes    ASSESSMENT AND PLAN:    61 year old male patient with history of pulmonary embolism, recurrent DVT lower extremity, COPD, bronchial asthma, coronary artery disease presented to the emergency room with right lower extremity pain and swelling.  1. 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 Depending on the anti-10 A levels patient will be discharged home with higher dose of Lovenox or pradaxa Follow-up with oncology, discussed with Dr. Smith Robertao  2. Right lower extremity pain Pain management as needed and oxycodone for moderate pain and morphine as needed for severe pain  3. Emphysema Stable . Nebs as needed  4. Coronary artery disease Patient is asymptomatic and denies any chest pain or  shortness of breath Continue Coreg, Lipitor and home medication Lasix   5. Hypokalemia replete as needed and recheck     All the records are  reviewed and case discussed with Care Management/Social Workerr. Management plans discussed with the patient, family and they are in agreement.  CODE STATUS: fc  TOTAL TIME TAKING CARE OF THIS PATIENT: .   POSSIBLE D/C IN 2 DAYS, DEPENDING ON CLINICAL CONDITION.  Note: This dictation was prepared with Dragon dictation along with smaller phrase technology. Any transcriptional errors that result from this process are unintentional.   Ramonita Lab M.D on 03/12/2017 at 12:59 PM  Between 7am to 6pm - Pager - [phone] After 6pm go to [email or web address] - password [email or web address] [facility]  [facility]  Office  [phone]  CC: Primary care physician; Mick Sell, MD

## 2017-03-12 NOTE — ED Notes (Signed)
Per Dr. Lamont Snowballifenbark, do not need IV at this time.  2 unsuccessful IV attempts prior by 2 nurses, butterfly used by this nurse to obtain blood sample

## 2017-03-12 NOTE — H&P (Addendum)
[facility] [place] at [facility]   PATIENT NAME: Brian Hampton    MR#:  [id number]  DATE OF BIRTH:  04-18-1956  DATE OF ADMISSION:  03/12/2017  PRIMARY CARE PHYSICIAN: Mick SellFitzgerald, David P, MD   REQUESTING/REFERRING PHYSICIAN:   CHIEF COMPLAINT:   Chief Complaint  Patient presents with  . Leg Pain    HISTORY OF PRESENT ILLNESS: Brian Hampton  is a 61 y.o. male with a known history of Anemia, bronchial asthma, collagen vascular disease, COPD, coronary artery disease, recurrent DVT, hypertension, HIV infection, pulmonary embolism who failed therapy on liquids and Coumadin currently on subcutaneous Lovenox anticoagulation resented to the emergency room with right leg pain. He has increased swelling in the right lower extremity and increased pain. The pain is aching in nature 6 out of 10 scale of 1-10. Patient was evaluated in the emergency room. He was worked up with a vascular Doppler ultrasound of the right lower extremity which showed increased progression in the deep venous thrombosis in the right lower extremity. Patient was started on IV heparin drip case was discussed with hematology attending who recommended admission and continued heparin drip. Hospitalist service was consulted.  PAST MEDICAL HISTORY:   Past Medical History:  Diagnosis Date  . Anemia   . Asthma   . Cataracts, bilateral    worse in Rt eye  . Collagen vascular disease (HCC)   . COPD (chronic obstructive pulmonary disease) (HCC)   . Coronary artery disease   . Depression   . DVT (deep venous thrombosis) (HCC)   . Dysrhythmia   . Emphysema/COPD (HCC)   . Environmental and seasonal allergies   . H/O blood clots   . Heart murmur   . HIV (human immunodeficiency virus infection) (HCC)   . Hypertension   . Leaky heart valve    x 3  . Lung mass   . Myocardial infarction (HCC)    in 2000  . Pneumonia    year ago  . Pulmonary emboli (HCC)   . Type 2 diabetes mellitus (HCC)     PAST  SURGICAL HISTORY: Past Surgical History:  Procedure Laterality Date  . ANKLE ARTHROSCOPY    . IVC FILTER INSERTION    . KYPHOPLASTY N/A 12/21/2016   Procedure: KYPHOPLASTY;  Surgeon: Kennedy BuckerMenz, Michael, MD;  Location: [facility] ORS;  Service: Orthopedics;  Laterality: N/A;  . PERIPHERAL VASCULAR CATHETERIZATION N/A 03/16/2016   Procedure: IVC Filter Insertion;  Surgeon: Renford DillsGregory G Schnier, MD;  Location: [facility] INVASIVE CV LAB;  Service: Cardiovascular;  Laterality: N/A;  . SINUS EXPLORATION    . WRIST ARTHROSCOPY Right     SOCIAL HISTORY:  Social History  Substance Use Topics  . Smoking status: Former Smoker    Quit date: 12/16/2001  . Smokeless tobacco: Never Used  . Alcohol use No    FAMILY HISTORY:  Family History  Problem Relation Age of Onset  . CAD Unknown   . Asthma Mother   . Cirrhosis Father   . Deep vein thrombosis Neg Hx   . Diabetes Neg Hx     DRUG ALLERGIES:  Allergies  Allergen Reactions  . Aspirin Anaphylaxis  . Bee Venom Anaphylaxis  . Penicillins Anaphylaxis and Other (See Comments)    Has patient had a PCN reaction causing immediate rash, facial/tongue/throat swelling, SOB or lightheadedness with hypotension: Yes Has patient had a PCN reaction causing severe rash involving mucus membranes or skin necrosis: No Has patient had a PCN reaction that required hospitalization No  Has patient had a PCN reaction occurring within the last 10 years: Yes If all of the above answers are "NO", then may proceed with Cephalosporin use.  . Prednisone Anaphylaxis  . Sulfa Antibiotics Anaphylaxis  . Sulfasalazine Anaphylaxis  . Theophyllines Anaphylaxis  . Theophylline Swelling    REVIEW OF SYSTEMS:   CONSTITUTIONAL: No fever, fatigue or weakness.  EYES: No blurred or double vision.  EARS, NOSE, AND THROAT: No tinnitus or ear pain.  RESPIRATORY: No cough, shortness of breath, wheezing or hemoptysis.  CARDIOVASCULAR: mild chest pain,  No orthopnea, edema.  GASTROINTESTINAL: No  nausea, vomiting, diarrhea or abdominal pain.  GENITOURINARY: No dysuria, hematuria.  ENDOCRINE: No polyuria, nocturia,  HEMATOLOGY: No anemia, has recurrent dvt Ecchymosis over abdomen area SKIN: No rash or lesion. MUSCULOSKELETAL: No joint pain or arthritis.   Right lower extremity pain NEUROLOGIC: No tingling, numbness, weakness.  PSYCHIATRY: No anxiety or depression.   MEDICATIONS AT HOME:  Prior to Admission medications   Medication Sig Start Date End Date Taking? Authorizing Provider  abacavir-dolutegravir-lamiVUDine (TRIUMEQ) 600-50-300 MG tablet Take 1 tablet by mouth daily after breakfast. Reported on 01/30/2016 01/27/16  Yes [provider]  albuterol (PROVENTIL HFA;VENTOLIN HFA) 108 (90 BASE) MCG/ACT inhaler Inhale 2 puffs into the lungs every 6 (six) hours as needed for wheezing or shortness of breath.   Yes [provider]  albuterol (PROVENTIL) (2.5 MG/3ML) 0.083% nebulizer solution Take 2.5 mg by nebulization 3 (three) times daily.    Yes [provider]  atorvastatin (LIPITOR) 10 MG tablet Take 10 mg by mouth daily after breakfast. 11/16/16  Yes [provider]  Calcium Carbonate-Vitamin D (CALCIUM 600+D) 600-400 MG-UNIT tablet Take 1 tablet by mouth daily after breakfast.    Yes [provider]  carvedilol (COREG) 12.5 MG tablet Take 12.5 mg by mouth 2 (two) times daily with a meal.   Yes [provider]  cetirizine (ZYRTEC) 10 MG tablet Take 10 mg by mouth daily as needed for allergies.    Yes [provider]  enoxaparin (LOVENOX) 80 MG/0.8ML injection Inject 0.8 mLs (80 mg total) into the skin every 12 (twelve) hours. 02/27/17 05/02/17 Yes Sainani, Rolly Pancake, MD  EPINEPHrine (EPIPEN 2-PAK) 0.3 mg/0.3 mL IJ SOAJ injection Inject 0.3 mg into the muscle once as needed (for severe allergic reaction).   Yes [provider]  escitalopram (LEXAPRO) 20 MG tablet Take 20 mg by mouth daily after breakfast.    Yes  [provider]  furosemide (LASIX) 20 MG tablet Take 20 mg by mouth daily.  10/22/16  Yes [provider]  HYDROcodone-acetaminophen (NORCO) 5-325 MG tablet Take 1 tablet by mouth every 4 (four) hours as needed for moderate pain. 03/05/17  Yes Auburn Bilberry, MD  ketorolac (ACULAR) 0.5 % ophthalmic solution Place 1 drop into the left eye 4 (four) times daily. 02/03/17  Yes [provider]  levothyroxine (SYNTHROID, LEVOTHROID) 50 MCG tablet Take 1 tablet (50 mcg total) by mouth daily before breakfast. 03/05/17 04/04/17 Yes Auburn Bilberry, MD  LINZESS 72 MCG capsule Take 72 mcg by mouth daily after breakfast. 11/10/16  Yes [provider]  losartan (COZAAR) 25 MG tablet Take 25 mg by mouth every morning. 03/03/17  Yes [provider]  magnesium oxide (MAG-OX) 400 MG tablet Take 400 mg by mouth daily after breakfast.   Yes [provider]  meloxicam (MOBIC) 7.5 MG tablet Take 7.5 mg by mouth daily as needed for pain.    Yes [provider]  mirtazapine (REMERON) 15 MG tablet Take 15 mg by mouth every evening.    Yes [provider]  mometasone-formoterol (DULERA) 200-5 MCG/ACT AERO Inhale 2 puffs into the lungs 3 (three) times daily.    Yes [provider]  Multiple Vitamin (MULTIVITAMIN WITH MINERALS) TABS tablet Take 1 tablet by mouth daily. ONE-A-DAY MULTIVITAMIN 50+   Yes [provider]  nitroGLYCERIN (NITROSTAT) 0.4 MG SL tablet Place 0.4 mg under the tongue every 5 (five) minutes as needed for chest pain.   Yes [provider]  ofloxacin (OCUFLOX) 0.3 % ophthalmic solution Place 1 drop into the left eye 4 (four) times daily. 01/31/17  Yes [provider]  omeprazole (PRILOSEC) 40 MG capsule Take 40 mg by mouth daily after breakfast. 11/16/16  Yes [provider]  Potassium Chloride CR (MICRO-K) 8 MEQ CPCR capsule CR Take 1 capsule by mouth daily after breakfast. 11/15/16  Yes [provider]  tiotropium (SPIRIVA) 18 MCG inhalation capsule Place 18 mcg into inhaler and inhale daily after breakfast.    Yes [provider]  traMADol (ULTRAM) 50 MG tablet Take 1 tablet (50 mg total) by mouth every 6 (six) hours as needed. Patient taking differently: Take 50 mg by mouth 2 (two) times daily.  03/17/16  Yes Katharina Caper, MD  zolpidem (AMBIEN) 5 MG tablet Take 5 mg by mouth at bedtime. 10/15/16  Yes [provider]  senna-docusate (SENOKOT-S) 8.6-50 MG tablet Take 1 tablet by mouth at bedtime as needed for mild constipation. Patient not taking: Reported on 11/26/2016 01/31/16   Katharina Caper, MD      PHYSICAL EXAMINATION:   VITAL SIGNS: Blood pressure 140/88, pulse 69, temperature 98.2 F (36.8 C), temperature source Oral, resp. rate 19, height 5\' 5"  (1.651 m), weight 87.1 kg (192 lb), SpO2 98 %.  GENERAL:  61 y.o.-year-old patient lying in the bed with no acute distress.  EYES: Pupils equal, round, reactive to light and accommodation. No scleral icterus. Extraocular muscles intact.  HEENT: Head atraumatic, normocephalic. Oropharynx and nasopharynx clear.  NECK:  Supple, no jugular venous distention. No thyroid enlargement, no tenderness.  LUNGS: Normal breath sounds bilaterally, no wheezing, rales,rhonchi or crepitation. No use of accessory muscles of respiration.  CARDIOVASCULAR: S1, S2 normal. No murmurs, rubs, or gallops.  ABDOMEN: Soft, nontender, nondistended. Bowel sounds present. No organomegaly or mass.  EXTREMITIES: No pedal edema, cyanosis, or clubbing.  Right lower extremity swelling and pain noted NEUROLOGIC: Cranial nerves II through XII are intact. Muscle strength 5/5 in all extremities. Sensation intact. Gait not checked.  PSYCHIATRIC: The patient is alert and oriented x 3.  SKIN: ecchymosis over abdominal wall area.  LABORATORY PANEL:   CBC  Recent Labs Lab 03/12/17 0321  WBC 6.7  HGB 12.0*  HCT 35.3*  PLT 308  MCV 99.5  MCH  33.9  MCHC 34.1  RDW 14.7*   ------------------------------------------------------------------------------------------------------------------  Chemistries   Recent Labs Lab 03/12/17 0321  NA 140  K 3.4*  CL 110  CO2 24  GLUCOSE 91  BUN 15  CREATININE 1.16  CALCIUM 8.7*   ------------------------------------------------------------------------------------------------------------------ estimated creatinine clearance is 69.5 mL/min (by C-G formula based on SCr of 1.16 mg/dL). ------------------------------------------------------------------------------------------------------------------ No results for input(s): TSH, T4TOTAL, T3FREE, THYROIDAB in the last 72 hours.  Invalid input(s): FREET3   Coagulation profile  Recent Labs Lab 03/12/17 0321  INR 0.98   ------------------------------------------------------------------------------------------------------------------- No results for input(s): DDIMER in the last 72 hours. -------------------------------------------------------------------------------------------------------------------  Cardiac Enzymes  Recent Labs Lab 03/12/17 0321  TROPONINI <0.03   ------------------------------------------------------------------------------------------------------------------ Invalid input(s): POCBNP  ---------------------------------------------------------------------------------------------------------------  Urinalysis    Component Value Date/Time   COLORURINE YELLOW (A) 05/05/2016 1631   APPEARANCEUR CLEAR (A) 05/05/2016 1631   LABSPEC 1.012 05/05/2016 1631   PHURINE 5.0 05/05/2016 1631   GLUCOSEU NEGATIVE 05/05/2016 1631   HGBUR 2+ (A) 05/05/2016 1631   BILIRUBINUR NEGATIVE 05/05/2016 1631   KETONESUR NEGATIVE 05/05/2016 1631   PROTEINUR 30 (A) 05/05/2016 1631   NITRITE NEGATIVE 05/05/2016 1631   LEUKOCYTESUR NEGATIVE 05/05/2016 1631     RADIOLOGY: [place] Venous Img Lower Bilateral  Result Date:  03/12/2017 CLINICAL DATA:  Hospitalized 2 weeks ago for right leg DVT. Assess deep venous thrombosis. Initial encounter. EXAM: BILATERAL LOWER EXTREMITY VENOUS DOPPLER ULTRASOUND TECHNIQUE: Gray-scale sonography with graded compression, as well as color Doppler and duplex ultrasound were performed to evaluate the lower extremity deep venous systems from the level of the common femoral vein and including the common femoral, femoral, profunda femoral, popliteal and calf veins including the posterior tibial, peroneal and gastrocnemius veins when visible. The superficial great saphenous vein was also interrogated. Spectral Doppler was utilized to evaluate flow at rest and with distal augmentation maneuvers in the common femoral, femoral and popliteal veins. COMPARISON:  Right lower extremity venous Doppler ultrasound performed 02/25/2017 FINDINGS: RIGHT LOWER EXTREMITY Common Femoral Vein: No evidence of thrombus. Normal compressibility, respiratory phasicity and response to augmentation. Saphenofemoral Junction: No evidence of thrombus. Normal compressibility and flow on color Doppler imaging. Profunda Femoral Vein: Nonocclusive thrombus is noted within the profunda femoral vein, new from the prior study. Femoral Vein: Occlusive thrombus is noted within the femoral vein, similar to the prior study. Popliteal Vein: Occlusive thrombus noted within the popliteal vein. Calf Veins: Nonocclusive thrombus is noted within the posterior tibial vein. The peroneal vein is patent, new from the prior study. Superficial Great Saphenous Vein: No evidence of thrombus. Normal compressibility and flow on color Doppler imaging. Venous Reflux:  None. Other Findings:  None. LEFT LOWER EXTREMITY Common Femoral Vein: No evidence of thrombus. Normal compressibility, respiratory phasicity and response to augmentation. Saphenofemoral Junction: No evidence of thrombus. Normal compressibility and flow on color Doppler imaging. Profunda Femoral  Vein: No evidence of thrombus. Normal compressibility and flow on color Doppler imaging. Femoral Vein: No evidence of thrombus. Normal compressibility, respiratory phasicity and response to augmentation. Popliteal Vein: No evidence of thrombus. Normal compressibility, respiratory phasicity and response to augmentation. Calf Veins: No evidence of thrombus. Normal compressibility and flow on color Doppler imaging. Superficial Great Saphenous Vein: No evidence of thrombus. Normal compressibility and flow on color Doppler imaging. Venous Reflux:  None. Other Findings:  None. IMPRESSION: 1. Deep venous thrombosis noted within the right profunda femoral vein, femoral vein, popliteal vein and posterior tibial vein, with occlusion at the mid to distal femoral vein and proximal popliteal vein. This has progressed slightly proximally since the prior study. 2. No evidence of DVT at the left lower extremity. These results were called by telephone at the time of interpretation on 03/12/2017 at 5:40 am to Dr. Merrily BrittleNEIL RIFENBARK, who verbally acknowledged these results. Electronically Signed   By: Roanna RaiderJeffery  Chang M.D.   On: 03/12/2017 05:42    EKG: Orders placed or performed during the hospital encounter of 03/12/17  . ED EKG within 10 minutes  . ED EKG within 10 minutes    IMPRESSION AND PLAN: 61 year old male patient with history of pulmonary embolism, recurrent DVT lower extremity, COPD, bronchial asthma,  coronary artery disease presented to the emergency room with right lower extremity pain and swelling. Admitting diagnosis 1. Recurrent deep venous thrombosis right lower extremity 2. Right lower extremity pain 3. Emphysema 4. Coronary artery disease Treatment plan Admit patient to medical floor Start IV heparin drip for anticoagulation Hematology consultation Pain management with IV morphine Replace potassium  All the records are reviewed and case discussed with ED provider. Management plans discussed with the  patient, family and they are in agreement.  CODE STATUS:FULL CODE Code Status History    Date Active Date Inactive Code Status Order ID Comments User Context   03/04/2017  4:05 AM 03/05/2017  6:33 PM Full Code [id number]  Arnaldo Natal, MD Inpatient   02/25/2017 11:52 PM 02/27/2017  6:06 PM DNR [id number]  Tonye Royalty, DO Inpatient   02/25/2017 10:27 PM 02/25/2017 11:52 PM Full Code [id number]  Tonye Royalty, DO Inpatient   12/21/2016  6:14 PM 12/24/2016  3:03 PM DNR [id number]  Kennedy Bucker, MD Inpatient   05/05/2016 10:17 PM 05/12/2016  6:20 PM DNR [id number]  Altamese Dilling, MD Inpatient   03/15/2016  5:42 PM 03/17/2016  6:59 PM DNR [id number]  Auburn Bilberry, MD ED   03/15/2016  5:29 PM 03/15/2016  5:42 PM Full Code [id number]  Auburn Bilberry, MD ED   01/30/2016 12:55 AM 02/03/2016 12:55 AM DNR [id number]  Gery Pray, MD Inpatient   01/13/2016  9:46 PM 01/16/2016  4:18 PM Full Code [id number]  Shaune Pollack, MD Inpatient   06/24/2015  7:13 AM 06/25/2015 12:27 PM Full Code [id number]  Milagros Loll, MD ED    Advance Directive Documentation     Most Recent Value  Type of Advance Directive  Living will  Pre-existing out of facility DNR order (yellow form or pink MOST form)  -  "MOST" Form in Place?  -       TOTAL TIME TAKING CARE OF THIS PATIENT: 52 minutes.    Ihor Austin M.D on 03/12/2017 at 6:33 AM  Between 7am to 6pm - Pager - [phone]  After 6pm go to [email or web address] - password [email or web address] [facility]  [facility]  Office  [phone]  CC: Primary care physician; Mick Sell, MD

## 2017-03-12 NOTE — ED Provider Notes (Signed)
[facility] Emergency Department Provider Note  ____________________________________________   First MD Initiated Contact with Patient 03/12/17 0315     (approximate)  I have reviewed the triage vital signs and the nursing notes.   HISTORY  Chief Complaint Leg Pain   HPI Brian Hampton is a 61 y.o. male who comes to the emergency department with severe insidious onset right lower extremity pain and lower abdominal pain for the past 24 hours or so. He has a past medical history of hepatitis C, HIV, retention, and a recent diagnosis of right lower extremity DVT for which she is using Lovenox as he had a new blood clots while taking warfarin. He reports compliance with his Lovenox although he does not like the bruising it causes. He's had no chest pain or shortness of breath. No numbness or weakness. Nothing seems to make his pain better or worse.   Past Medical History:  Diagnosis Date  . Anemia   . Asthma   . Cataracts, bilateral    worse in Rt eye  . Collagen vascular disease (HCC)   . COPD (chronic obstructive pulmonary disease) (HCC)   . Coronary artery disease   . Depression   . DVT (deep venous thrombosis) (HCC)   . Dysrhythmia   . Emphysema/COPD (HCC)   . Environmental and seasonal allergies   . H/O blood clots   . Heart murmur   . HIV (human immunodeficiency virus infection) (HCC)   . Hypertension   . Leaky heart valve    x 3  . Lung mass   . Myocardial infarction (HCC)    in 2000  . Pneumonia    year ago  . Pulmonary emboli (HCC)   . Type 2 diabetes mellitus [facility](HCC)     Patient Active Problem List   Diagnosis Date Noted  . Acute deep vein thrombosis (DVT) of right lower extremity (HCC) 02/25/2017  . Postoperative pain   . COPD exacerbation (HCC) 12/23/2016  . Abdominal pain   . Compression fracture of body of thoracic vertebra (HCC) 12/21/2016  . Hypomagnesemia 05/05/2016  . Rhabdomyolysis 05/05/2016  . Acute pulmonary embolism  (HCC) 03/17/2016  . Right leg DVT (HCC) 03/17/2016  . Hypokalemia 03/17/2016  . S/P IVC filter 03/17/2016  . SOB (shortness of breath) 03/15/2016  . Dysphagia 02/02/2016  . GERD (gastroesophageal reflux disease) 02/02/2016  . Hyperthyroidism 01/31/2016  . 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 Elevated transaminase level 01/31/2016  . Anemia 01/31/2016  . Thrombocytopenia (HCC) 01/31/2016  . Pyuria 01/31/2016  . Weakness 01/29/2016  . HIV (human immunodeficiency virus infection) (HCC) 01/29/2016  . CAD (coronary artery disease) 01/29/2016  . COPD (chronic obstructive pulmonary disease) (HCC) 01/29/2016  . Diabetes mellitus (HCC) 01/29/2016  . Leg weakness, bilateral 01/13/2016  . Chest pain 06/24/2015    Past Surgical History:  Procedure Laterality Date  . ANKLE ARTHROSCOPY    . IVC FILTER INSERTION    . KYPHOPLASTY N/A 12/21/2016   Procedure: KYPHOPLASTY;  Surgeon: Kennedy BuckerMenz, Michael, MD;  Location: [facility] ORS;  Service: Orthopedics;  Laterality: N/A;  . PERIPHERAL VASCULAR CATHETERIZATION N/A 03/16/2016   Procedure: IVC Filter Insertion;  Surgeon: Renford DillsGregory G Schnier, MD;  Location: [facility] INVASIVE CV LAB;  Service: Cardiovascular;  Laterality: N/A;  . SINUS EXPLORATION    . WRIST ARTHROSCOPY Right     Prior to Admission medications   Medication Sig Start Date End Date Taking? Authorizing Provider  abacavir-dolutegravir-lamiVUDine (TRIUMEQ) 600-50-300 MG tablet Take 1 tablet by mouth daily  after breakfast. Reported on 01/30/2016 01/27/16  Yes [provider]  albuterol (PROVENTIL HFA;VENTOLIN HFA) 108 (90 BASE) MCG/ACT inhaler Inhale 2 puffs into the lungs every 6 (six) hours as needed for wheezing or shortness of breath.   Yes [provider]  albuterol (PROVENTIL) (2.5 MG/3ML) 0.083% nebulizer solution Take 2.5 mg by nebulization 3 (three) times daily.    Yes [provider]  atorvastatin (LIPITOR) 10 MG tablet Take 10 mg by mouth daily after breakfast. 11/16/16   Yes [provider]  Calcium Carbonate-Vitamin D (CALCIUM 600+D) 600-400 MG-UNIT tablet Take 1 tablet by mouth daily after breakfast.    Yes [provider]  carvedilol (COREG) 12.5 MG tablet Take 12.5 mg by mouth 2 (two) times daily with a meal.   Yes [provider]  cetirizine (ZYRTEC) 10 MG tablet Take 10 mg by mouth daily as needed for allergies.    Yes [provider]  enoxaparin (LOVENOX) 80 MG/0.8ML injection Inject 0.8 mLs (80 mg total) into the skin every 12 (twelve) hours. 02/27/17 05/02/17 Yes Sainani, Rolly PancakeVivek J, MD  EPINEPHrine (EPIPEN 2-PAK) 0.3 mg/0.3 mL IJ SOAJ injection Inject 0.3 mg into the muscle once as needed (for severe allergic reaction).   Yes [provider]  furosemide (LASIX) 20 MG tablet Take 20 mg by mouth daily.  10/22/16  Yes [provider]  HYDROcodone-acetaminophen (NORCO) 5-325 MG tablet Take 1 tablet by mouth every 4 (four) hours as needed for moderate pain. 03/05/17  Yes Auburn BilberryPatel, Shreyang, MD  ketorolac (ACULAR) 0.5 % ophthalmic solution Place 1 drop into the left eye 4 (four) times daily. 02/03/17  Yes [provider]  levothyroxine (SYNTHROID, LEVOTHROID) 50 MCG tablet Take 1 tablet (50 mcg total) by mouth daily before breakfast. 03/05/17 04/04/17 Yes Auburn BilberryPatel, Shreyang, MD  LINZESS 72 MCG capsule Take 72 mcg by mouth daily after breakfast. 11/10/16  Yes [provider]  magnesium oxide (MAG-OX) 400 MG tablet Take 400 mg by mouth daily after breakfast.   Yes [provider]  meloxicam (MOBIC) 7.5 MG tablet Take 7.5 mg by mouth daily as needed for pain.    Yes [provider]  mirtazapine (REMERON) 15 MG tablet Take 15 mg by mouth every evening.    Yes [provider]  mometasone-formoterol (DULERA) 200-5 MCG/ACT AERO Inhale 2 puffs into the lungs 3 (three) times daily.    Yes [provider]  Multiple Vitamin (MULTIVITAMIN WITH MINERALS) TABS tablet Take 1 tablet by mouth  daily. ONE-A-DAY MULTIVITAMIN 50+   Yes [provider]  nitroGLYCERIN (NITROSTAT) 0.4 MG SL tablet Place 0.4 mg under the tongue every 5 (five) minutes as needed for chest pain.   Yes [provider]  ofloxacin (OCUFLOX) 0.3 % ophthalmic solution Place 1 drop into the left eye 4 (four) times daily. 01/31/17  Yes [provider]  omeprazole (PRILOSEC) 40 MG capsule Take 40 mg by mouth daily after breakfast. 11/16/16  Yes [provider]  Potassium Chloride CR (MICRO-K) 8 MEQ CPCR capsule CR Take 1 capsule by mouth daily after breakfast. 11/15/16  Yes [provider]  tiotropium (SPIRIVA) 18 MCG inhalation capsule Place 18 mcg into inhaler and inhale daily after breakfast.    Yes [provider]  traMADol (ULTRAM) 50 MG tablet Take 1 tablet (50 mg total) by mouth every 6 (six) hours as needed. Patient taking differently: Take 50 mg by mouth 2 (two) times daily.  03/17/16  Yes Katharina CaperVaickute, Rima, MD  zolpidem (  AMBIEN) 5 MG tablet Take 5 mg by mouth at bedtime. 10/15/16  Yes [provider]  escitalopram (LEXAPRO) 20 MG tablet Take 20 mg by mouth daily after breakfast.     [provider]  senna-docusate (SENOKOT-S) 8.6-50 MG tablet Take 1 tablet by mouth at bedtime as needed for mild constipation. Patient not taking: Reported on 11/26/2016 01/31/16   Katharina Caper, MD    Allergies Aspirin; Bee venom; Penicillins; Prednisone; Sulfa antibiotics; Sulfasalazine; Theophyllines; and Theophylline  Family History  Problem Relation Age of Onset  . CAD Unknown     Social History Social History  Substance Use Topics  . Smoking status: Former Smoker    Quit date: 12/16/2001  . Smokeless tobacco: Never Used  . Alcohol use No    Review of Systems Constitutional: No fever/chills Eyes: No visual changes. ENT: No sore throat. Cardiovascular: Denies chest pain. Respiratory: Denies shortness of breath. Gastrointestinal: Positive abdominal  pain.  No nausea, no vomiting.  No diarrhea.  No constipation. Genitourinary: Negative for dysuria. Musculoskeletal: Negative for back pain. Skin: Negative for rash. Neurological: Negative for headaches, focal weakness or numbness.   ____________________________________________   PHYSICAL EXAM:  VITAL SIGNS: ED Triage Vitals  Enc Vitals Group     BP 03/12/17 0311 (!) 158/98     Pulse Rate 03/12/17 0311 74     Resp 03/12/17 0311 16     Temp 03/12/17 0311 98.2 F (36.8 C)     Temp Source 03/12/17 0311 Oral     SpO2 03/12/17 0311 97 %     Weight 03/12/17 0312 192 lb (87.1 kg)     Height 03/12/17 0312 5\' 5"  (1.651 m)     Head Circumference --      Peak Flow --      Pain Score 03/12/17 0311 10     Pain Loc --      Pain Edu? --      Excl. in GC? --     Constitutional: Alert and oriented 4 pleasant cooperative speaks in full clear sentences Eyes: PERRL EOMI. Head: Atraumatic. Nose: No congestion/rhinnorhea. Mouth/Throat: No trismus Neck: No stridor.   Cardiovascular: Normal rate, regular rhythm. Peri [place] peripheral circulation. Respiratory: Normal respiratory effort.  No retractions. Lungs CTAB and moving good air Gastrointestinal: Obese abdomen soft nondistended nontender no rebound or guarding no peritonitis lower abdomen with ecchymoses from Lovenox Musculoskeletal: Right lower extremity slightly swollen although not red. Neurologic:  Normal speech and language. No gross focal neurologic deficits are appreciated. Skin:  Skin is warm, dry and intact. No rash noted. Psychiatric: Mood and affect are normal. Speech and behavior are normal.    ____________________________________________ _________________   LABS (all labs ordered are listed, but only abnormal results are displayed)  Labs Reviewed  BASIC METABOLIC PANEL - Abnormal; Notable for the following:       Result Value   Potassium 3.4 (*)    Calcium 8.7 (*)    All other components within normal limits  CBC -  Abnormal; Notable for the following:    RBC 3.55 (*)    Hemoglobin 12.0 (*)    HCT 35.3 (*)    RDW 14.7 (*)    All other components within normal limits  TROPONIN I  PROTIME-INR    No signs of acute ischemia __________________________________________  EKG   ____________________________________________  RADIOLOGY  Ultrasound shows progressive of deep vein thrombus ____________________________________________   PROCEDURES  Procedure(s) performed: no  Procedures  Critical Care performed: yes  CRITICAL CARE Performed by: Merrily Brittle   Total critical care time: 35 minutes  Critical care time was exclusive of separately billable procedures and treating other patients.  Critical care was necessary to treat or prevent imminent or life-threatening deterioration.  Critical care was time spent personally by me on the following activities: development of treatment plan with patient and/or surrogate as well as nursing, discussions with consultants, evaluation of patient's response to treatment, examination of patient, obtaining history from patient or surrogate, ordering and performing treatments and interventions, ordering and review of laboratory studies, ordering and review of radiographic studies, pulse oximetry and re-evaluation of patient's condition.   Observation: no ____________________________________________   INITIAL IMPRESSION / ASSESSMENT AND PLAN / ED COURSE  Pertinent labs & imaging results that were available during my care of the patient were reviewed by me and considered in my medical decision making (see chart for details).  On arrival the patient is clearly uncomfortable appearing. He has failed multiple anticoagulants and I'm concerned that he may have failed Lovenox as well. Labs and ultrasound are pending as well as opioid pain medication.     ----------------------------------------- 5:47 AM on  03/12/2017 -----------------------------------------  I was called by radiology that the patient has persistent DVT in the right lower extremity that hasn't fact progressed slightly. At this point he has failed eloquent risk, Coumadin, and as of today seems to have failed lovenox.  Heme consult pending. ____________________________________________  ----------------------------------------- 5:59 AM on 03/12/2017 -----------------------------------------  I discussed the case with on-call hematologist Dr. Smith Robert who recommends inpatient admission with heparin drip as the patient has failed eliquis, coumadin, and lovenox.  He will require a complex care plan o best determine how to anticoagulate him.  FINAL CLINICAL IMPRESSION(S) / ED DIAGNOSES  Final diagnoses:  Acute deep vein thrombosis (DVT) of other specified vein of right lower extremity (HCC)      NEW MEDICATIONS STARTED DURING THIS VISIT:  New Prescriptions   No medications on file     Note:  This document was prepared using Dragon voice recognition software and may include unintentional dictation errors.     Merrily Brittle, MD 03/12/17 [phone]

## 2017-03-12 NOTE — ED Notes (Signed)
Pt given juice and snack.

## 2017-03-13 ENCOUNTER — Inpatient Hospital Stay: Payer: Medicare Other

## 2017-03-13 ENCOUNTER — Encounter: Payer: Self-pay | Admitting: Radiology

## 2017-03-13 DIAGNOSIS — I2699 Other pulmonary embolism without acute cor pulmonale: Secondary | ICD-10-CM

## 2017-03-13 DIAGNOSIS — R1310 Dysphagia, unspecified: Secondary | ICD-10-CM

## 2017-03-13 DIAGNOSIS — D649 Anemia, unspecified: Secondary | ICD-10-CM

## 2017-03-13 DIAGNOSIS — J449 Chronic obstructive pulmonary disease, unspecified: Secondary | ICD-10-CM

## 2017-03-13 DIAGNOSIS — B20 Human immunodeficiency virus [HIV] disease: Secondary | ICD-10-CM

## 2017-03-13 DIAGNOSIS — R0602 Shortness of breath: Secondary | ICD-10-CM

## 2017-03-13 DIAGNOSIS — Z7901 Long term (current) use of anticoagulants: Secondary | ICD-10-CM

## 2017-03-13 DIAGNOSIS — R531 Weakness: Secondary | ICD-10-CM

## 2017-03-13 DIAGNOSIS — K219 Gastro-esophageal reflux disease without esophagitis: Secondary | ICD-10-CM

## 2017-03-13 DIAGNOSIS — Z87891 Personal history of nicotine dependence: Secondary | ICD-10-CM

## 2017-03-13 DIAGNOSIS — R109 Unspecified abdominal pain: Secondary | ICD-10-CM

## 2017-03-13 DIAGNOSIS — I2510 Atherosclerotic heart disease of native coronary artery without angina pectoris: Secondary | ICD-10-CM

## 2017-03-13 DIAGNOSIS — D696 Thrombocytopenia, unspecified: Secondary | ICD-10-CM

## 2017-03-13 DIAGNOSIS — R5383 Other fatigue: Secondary | ICD-10-CM

## 2017-03-13 DIAGNOSIS — R079 Chest pain, unspecified: Secondary | ICD-10-CM

## 2017-03-13 DIAGNOSIS — E119 Type 2 diabetes mellitus without complications: Secondary | ICD-10-CM

## 2017-03-13 DIAGNOSIS — Z8673 Personal history of transient ischemic attack (TIA), and cerebral infarction without residual deficits: Secondary | ICD-10-CM

## 2017-03-13 DIAGNOSIS — I824Z1 Acute embolism and thrombosis of unspecified deep veins of right distal lower extremity: Secondary | ICD-10-CM

## 2017-03-13 DIAGNOSIS — Z79899 Other long term (current) drug therapy: Secondary | ICD-10-CM

## 2017-03-13 DIAGNOSIS — E876 Hypokalemia: Secondary | ICD-10-CM

## 2017-03-13 LAB — CBC
HCT: 32.9 % — ABNORMAL LOW (ref 40.0–52.0)
Hemoglobin: 11.3 g/dL — ABNORMAL LOW (ref 13.0–18.0)
MCH: 34.3 pg — ABNORMAL HIGH (ref 26.0–34.0)
MCHC: 34.2 g/dL (ref 32.0–36.0)
MCV: 100.3 fL — AB (ref 80.0–100.0)
PLATELETS: 269 10*3/uL (ref 150–440)
RBC: 3.28 MIL/uL — AB (ref 4.40–5.90)
RDW: 15.2 % — ABNORMAL HIGH (ref 11.5–14.5)
WBC: 5.4 10*3/uL (ref 3.8–10.6)

## 2017-03-13 LAB — BASIC METABOLIC PANEL
Anion gap: 5 (ref 5–15)
BUN: 17 mg/dL (ref 6–20)
CALCIUM: 8.7 mg/dL — AB (ref 8.9–10.3)
CHLORIDE: 110 mmol/L (ref 101–111)
CO2: 25 mmol/L (ref 22–32)
CREATININE: 1.16 mg/dL (ref 0.61–1.24)
Glucose, Bld: 119 mg/dL — ABNORMAL HIGH (ref 65–99)
Potassium: 3.9 mmol/L (ref 3.5–5.1)
SODIUM: 140 mmol/L (ref 135–145)

## 2017-03-13 LAB — URIC ACID: URIC ACID, SERUM: 6.4 mg/dL (ref 4.4–7.6)

## 2017-03-13 LAB — APTT: APTT: 33 s (ref 24–36)

## 2017-03-13 LAB — HEPARIN ANTI-XA: HEPARIN LMW: 2 [IU]/mL

## 2017-03-13 LAB — MAGNESIUM: Magnesium: 1.8 mg/dL (ref 1.7–2.4)

## 2017-03-13 LAB — HEPARIN LEVEL (UNFRACTIONATED): HEPARIN UNFRACTIONATED: 1.66 [IU]/mL — AB (ref 0.30–0.70)

## 2017-03-13 MED ORDER — ENOXAPARIN SODIUM 100 MG/ML ~~LOC~~ SOLN
1.0000 mg/kg | Freq: Two times a day (BID) | SUBCUTANEOUS | Status: DC
  Administered 2017-03-13: 85 mg via SUBCUTANEOUS
  Filled 2017-03-13 (×2): qty 1

## 2017-03-13 MED ORDER — HYDROCODONE-ACETAMINOPHEN 5-325 MG PO TABS: 1.0000 | ORAL_TABLET | ORAL | Status: DC | PRN

## 2017-03-13 MED ORDER — ALBUTEROL SULFATE (2.5 MG/3ML) 0.083% IN NEBU
2.5000 mg | INHALATION_SOLUTION | RESPIRATORY_TRACT | Status: DC
  Administered 2017-03-13 – 2017-03-14 (×3): 2.5 mg via RESPIRATORY_TRACT
  Filled 2017-03-13 (×3): qty 3

## 2017-03-13 MED ORDER — COLCHICINE 0.6 MG PO TABS
0.6000 mg | ORAL_TABLET | Freq: Two times a day (BID) | ORAL | Status: AC
  Administered 2017-03-13 – 2017-03-15 (×4): 0.6 mg via ORAL
  Filled 2017-03-13 (×4): qty 1

## 2017-03-13 MED ORDER — IPRATROPIUM-ALBUTEROL 0.5-2.5 (3) MG/3ML IN SOLN
3.0000 mL | Freq: Four times a day (QID) | RESPIRATORY_TRACT | Status: DC
  Administered 2017-03-13: 3 mL via RESPIRATORY_TRACT
  Filled 2017-03-13: qty 3

## 2017-03-13 MED ORDER — METHYLPREDNISOLONE SODIUM SUCC 125 MG IJ SOLR
60.0000 mg | Freq: Once | INTRAMUSCULAR | Status: AC
  Administered 2017-03-13: 60 mg via INTRAVENOUS
  Filled 2017-03-13: qty 2

## 2017-03-13 MED ORDER — AZITHROMYCIN 250 MG PO TABS
250.0000 mg | ORAL_TABLET | Freq: Every day | ORAL | Status: DC
  Administered 2017-03-14 – 2017-03-15 (×2): 250 mg via ORAL
  Filled 2017-03-13 (×2): qty 1

## 2017-03-13 MED ORDER — IOPAMIDOL (ISOVUE-370) INJECTION 76%
75.0000 mL | Freq: Once | INTRAVENOUS | Status: AC | PRN
  Administered 2017-03-13: 75 mL via INTRAVENOUS

## 2017-03-13 MED ORDER — HYDROCODONE-ACETAMINOPHEN 5-325 MG PO TABS: 1.0000 | ORAL_TABLET | Freq: Four times a day (QID) | ORAL | Status: DC | PRN

## 2017-03-13 MED ORDER — METHYLPREDNISOLONE SODIUM SUCC 125 MG IJ SOLR
60.0000 mg | Freq: Three times a day (TID) | INTRAMUSCULAR | Status: DC
  Administered 2017-03-13: 60 mg via INTRAVENOUS
  Filled 2017-03-13: qty 2

## 2017-03-13 MED ORDER — GUAIFENESIN ER 600 MG PO TB12
600.0000 mg | ORAL_TABLET | Freq: Two times a day (BID) | ORAL | Status: DC
  Administered 2017-03-13 – 2017-03-15 (×4): 600 mg via ORAL
  Filled 2017-03-13 (×4): qty 1

## 2017-03-13 MED ORDER — AZITHROMYCIN 500 MG PO TABS
500.0000 mg | ORAL_TABLET | Freq: Every day | ORAL | Status: AC
  Administered 2017-03-13: 500 mg via ORAL
  Filled 2017-03-13: qty 1

## 2017-03-13 MED ORDER — HEPARIN (PORCINE) IN NACL 100-0.45 UNIT/ML-% IJ SOLN
1400.0000 [IU]/h | INTRAMUSCULAR | Status: DC
  Administered 2017-03-13: 1400 [IU]/h via INTRAVENOUS
  Filled 2017-03-13: qty 250

## 2017-03-13 MED ORDER — TIOTROPIUM BROMIDE MONOHYDRATE 18 MCG IN CAPS
18.0000 ug | ORAL_CAPSULE | Freq: Every day | RESPIRATORY_TRACT | Status: DC
  Administered 2017-03-14 – 2017-03-15 (×2): 18 ug via RESPIRATORY_TRACT
  Filled 2017-03-13: qty 5

## 2017-03-13 MED ORDER — PANTOPRAZOLE SODIUM 40 MG PO TBEC
40.0000 mg | DELAYED_RELEASE_TABLET | Freq: Two times a day (BID) | ORAL | Status: DC
  Administered 2017-03-14 – 2017-03-15 (×3): 40 mg via ORAL
  Filled 2017-03-13 (×3): qty 1

## 2017-03-13 MED ORDER — ALBUTEROL SULFATE (2.5 MG/3ML) 0.083% IN NEBU: 2.5000 mg | INHALATION_SOLUTION | RESPIRATORY_TRACT | Status: DC | PRN

## 2017-03-13 NOTE — Progress Notes (Signed)
[facility] [place] [facility]   PATIENT NAME: Brian Hampton    MR#:  [id number]  DATE OF BIRTH:  20-Sep-1955  SUBJECTIVE:  CHIEF COMPLAINT:   Chief Complaint  Patient presents with  . Leg Pain  Patient is a 61 year old Caucasian male with past medical history significant for history of asthma, collagen vascular disease, COPD, coronary artery disease, recurrent DVTs, hypertension, HIV, pulmonary embolism, who is on Lovenox , who presents to the hospital with right lower extremity swelling and pain. Doppler ultrasound revealed progression of DVT in the right lower extremity. Patient was admitted. Today, he started noticing pleuritic chest pain, less cough, dry cough, some wheezing and shortness of breath. He also complained of right sided numbness and weakness, pain in multiple joints including right shoulder, right hip, knee. He was not able to stand due to pain and, according to patient weakness and numbness. Due to concerns of stroke, the  Patient underwent urgent CT and CT angiogram of the head, revealing no significant abnormalities. During my evaluation, he complains of pain more then numbness or weakness, admits that he's been having right lower extremity weakness more than right upper extremity weakness since stroke which happened in 2006.      Review of Systems  Musculoskeletal: Positive for joint pain and myalgias.  Neurological: Positive for sensory change and focal weakness.    VITAL SIGNS: Blood pressure (!) 145/84, pulse 85, temperature 98.4 F (36.9 C), temperature source Oral, resp. rate 18, height 5\' 5"  (1.651 m), weight 87.1 kg (192 lb), SpO2 98 %.  PHYSICAL EXAMINATION:   GENERAL:  61 y.o.-year-old patient lying in the bed with no acute distress.  EYES: Pupils equal, round, reactive to light and accommodation. No scleral icterus. Extraocular muscles intact.  HEENT: Head atraumatic, normocephalic. Oropharynx and nasopharynx clear.  NECK:   Supple, no jugular venous distention. No thyroid enlargement, no tenderness.  LUNGS: . Markedly diminished breath sounds bilaterally, scattered diffuse wheezing, , few, but no crepitations.   intermittent use of accessory muscles of respiration.  CARDIOVASCULAR: S1, S2 normal. No murmurs, rubs, or gallops.  ABDOMEN: Soft, nontender, nondistended. Bowel sounds present. No organomegaly or mass.  EXTREMITIES: . Trace to 1+ lower extremity and pedal edema bilaterally, no  cyanosis, or clubbing. Palpation and passive movements of multiple joints including right shoulder, right hip and knee was very painful  NEUROLOGIC: Cranial nerves II through XII are intact. Muscle strength 4/5 in right-sided extremities, right lower extremity more than the right upper extremity . Sensation  diminished to light touch on the right . Gait not checked.  PSYCHIATRIC: The patient is alert and oriented x 3.  SKIN: No obvious rash, lesion, or ulcer.   ORDERS/RESULTS REVIEWED:   CBC  Recent Labs Lab 03/12/17 0321 03/13/17 0337  WBC 6.7 5.4  HGB 12.0* 11.3*  HCT 35.3* 32.9*  PLT 308 269  MCV 99.5 100.3*  MCH 33.9 34.3*  MCHC 34.1 34.2  RDW 14.7* 15.2*   ------------------------------------------------------------------------------------------------------------------  Chemistries   Recent Labs Lab 03/12/17 0321 03/13/17 0337  NA 140 140  K 3.4* 3.9  CL 110 110  CO2 24 25  GLUCOSE 91 119*  BUN 15 17  CREATININE 1.16 1.16  CALCIUM 8.7* 8.7*   ------------------------------------------------------------------------------------------------------------------ estimated creatinine clearance is 69.5 mL/min (by C-G formula based on SCr of 1.16 mg/dL). ------------------------------------------------------------------------------------------------------------------ No results for input(s): TSH, T4TOTAL, T3FREE, THYROIDAB in the last 72 hours.  Invalid input(s): FREET3  Cardiac Enzymes  Recent Labs  Lab  03/12/17 0321  TROPONINI <0.03   ------------------------------------------------------------------------------------------------------------------ Invalid input(s): POCBNP ---------------------------------------------------------------------------------------------------------------  RADIOLOGY: Ct Angio Head W Or Wo Contrast  Result Date: 03/13/2017 CLINICAL DATA:  61 y/o  M; 1 hour of right-sided numbness. EXAM: CT ANGIOGRAPHY HEAD TECHNIQUE: Multidetector CT imaging of the head was performed using the standard protocol during bolus administration of intravenous contrast. Multiplanar CT image reconstructions and MIPs were obtained to evaluate the vascular anatomy. CONTRAST:  75 cc Isovue 370 COMPARISON:  01/30/2016 MRI of the head.  03/13/2017 CT of the head. FINDINGS: CTA HEAD Anterior circulation: No significant stenosis, proximal occlusion, aneurysm, or vascular malformation. Posterior circulation: No significant stenosis, proximal occlusion, aneurysm, or vascular malformation. Venous sinuses: As permitted by contrast timing, patent. Anatomic variants: Bilateral fetal PCA. Small caliber vertebrobasilar system with similar small flow voids on the prior MRI of the brain, likely anatomic variant due to bilateral fetal PCA. Delayed phase: No abnormal intracranial enhancement. IMPRESSION: Patent circle of Willis. No large vessel occlusion, aneurysm, or significant stenosis. Variant anatomy with bilateral fetal PCA and small caliber vertebrobasilar system. Electronically Signed   By: [name] M.D.   On: 03/13/2017 16:38   Dg Chest 2 View  Result Date: 03/13/2017 CLINICAL DATA:  Former smoker hx of COPD, asthma, CAD admitted with multiple blood clots (per patient). EXAM: CHEST  2 VIEW COMPARISON:  Chest x-ray dated 11/26/2016. Chest CT dated 03/03/2017. FINDINGS: Heart size and mediastinal contours are within normal limits. Lungs appear clear. No pleural effusion or pneumothorax seen.  Stable mild compression deformity of a mid thoracic vertebral body. No acute or suspicious osseous finding seen. IVC filter noted in the upper abdomen. IMPRESSION: No active cardiopulmonary disease. No evidence of pneumonia or pulmonary edema. Electronically Signed   By: Bary Richard M.D.   On: 03/13/2017 11:27   Ct Head Wo Contrast  Result Date: 03/13/2017 CLINICAL DATA:  Right sided numbness x 1 hour ago EXAM: CT HEAD WITHOUT CONTRAST TECHNIQUE: Contiguous axial images were obtained from the base of the skull through the vertex without intravenous contrast. COMPARISON:  02/01/2016 FINDINGS: Brain: There is mild central and cortical atrophy. Mild periventricular white matter changes are consistent with small vessel disease. There is no intra or extra-axial fluid collection or mass lesion. The basilar cisterns and ventricles have a normal appearance. There is no CT evidence for acute infarction or hemorrhage. Vascular: No hyperdense vessel or unexpected calcification. Skull: No acute abnormality. Sinuses/Orbits: No acute finding. Other: None IMPRESSION: 1.  No evidence for acute  abnormality. 2. Mild atrophy and small vessel disease. Electronically Signed   By: Norva Pavlov M.D.   On: 03/13/2017 16:03   US Venous Img Lower Bilateral  Result Date: 03/12/2017 CLINICAL DATA:  Hospitalized 2 weeks ago for right leg DVT. Assess deep venous thrombosis. Initial encounter. EXAM: BILATERAL LOWER EXTREMITY VENOUS DOPPLER ULTRASOUND TECHNIQUE: Gray-scale sonography with graded compression, as well as color Doppler and duplex ultrasound were performed to evaluate the lower extremity deep venous systems from the level of the common femoral vein and including the common femoral, femoral, profunda femoral, popliteal and calf veins including the posterior tibial, peroneal and gastrocnemius veins when visible. The superficial great saphenous vein was also interrogated. Spectral Doppler was utilized to evaluate flow at  rest and with distal augmentation maneuvers in the common femoral, femoral and popliteal veins. COMPARISON:  Right lower extremity venous Doppler ultrasound performed 02/25/2017 FINDINGS: RIGHT LOWER EXTREMITY Common Femoral Vein: No evidence of thrombus. Normal compressibility, respiratory phasicity  and response to augmentation. Saphenofemoral Junction: No evidence of thrombus. Normal compressibility and flow on color Doppler imaging. Profunda Femoral Vein: Nonocclusive thrombus is noted within the profunda femoral vein, new from the prior study. Femoral Vein: Occlusive thrombus is noted within the femoral vein, similar to the prior study. Popliteal Vein: Occlusive thrombus noted within the popliteal vein. Calf Veins: Nonocclusive thrombus is noted within the posterior tibial vein. The peroneal vein is patent, new from the prior study. Superficial Great Saphenous Vein: No evidence of thrombus. Normal compressibility and flow on color Doppler imaging. Venous Reflux:  None. Other Findings:  None. LEFT LOWER EXTREMITY Common Femoral Vein: No evidence of thrombus. Normal compressibility, respiratory phasicity and response to augmentation. Saphenofemoral Junction: No evidence of thrombus. Normal compressibility and flow on color Doppler imaging. Profunda Femoral Vein: No evidence of thrombus. Normal compressibility and flow on color Doppler imaging. Femoral Vein: No evidence of thrombus. Normal compressibility, respiratory phasicity and response to augmentation. Popliteal Vein: No evidence of thrombus. Normal compressibility, respiratory phasicity and response to augmentation. Calf Veins: No evidence of thrombus. Normal compressibility and flow on color Doppler imaging. Superficial Great Saphenous Vein: No evidence of thrombus. Normal compressibility and flow on color Doppler imaging. Venous Reflux:  None. Other Findings:  None. IMPRESSION: 1. Deep venous thrombosis noted within the right profunda femoral vein, femoral  vein, popliteal vein and posterior tibial vein, with occlusion at the mid to distal femoral vein and proximal popliteal vein. This has progressed slightly proximally since the prior study. 2. No evidence of DVT at the left lower extremity. These results were called by telephone at the time of interpretation on 03/12/2017 at 5:40 am to Dr. Merrily BrittleNEIL RIFENBARK, who verbally acknowledged these results. Electronically Signed   By: Roanna RaiderJeffery  Chang M.D.   On: 03/12/2017 05:42    EKG:  Orders placed or performed during the hospital encounter of 03/12/17  . ED EKG within 10 minutes  . ED EKG within 10 minutes    ASSESSMENT AND PLAN:  Active Problems:   DVT (deep vein thrombosis) in pregnancy (HCC) #1, the right-sided weakness, initially felt to be new stroke, but now it appears to be likely chronic sequela of stroke which happened in 2006, CT and CT angiogram of the head showed no significant abnormalities,  Recommend physical therapy   #2. Multiple joint pain, concerning for gout/pseudogout, getting uric acid level, initiate prednisone, physical therapy, continue pain medications . #3. Right lower extremity DVT, continue Lovenox #4. Pleuritic chest pain, with cough, but likely related to COPD, advanced albuterol to every 4 hours, continue patient on inhaled steroids, add tiotropium, oxygen as needed, follow clinically, initiate patient on Tussionex and Mucinex  Management plans discussed with the patient, family and they are in agreement.   DRUG ALLERGIES:  Allergies  Allergen Reactions  . Aspirin Anaphylaxis  . Bee Venom Anaphylaxis  . Penicillins Anaphylaxis and Other (See Comments)    Has patient had a PCN reaction causing immediate rash, facial/tongue/throat swelling, SOB or lightheadedness with hypotension: Yes Has patient had a PCN reaction causing severe rash involving mucus membranes or skin necrosis: No Has patient had a PCN reaction that required hospitalization No Has patient had a PCN  reaction occurring within the last 10 years: Yes If all of the above answers are "NO", then may proceed with Cephalosporin use.  . Prednisone Anaphylaxis  . Sulfa Antibiotics Anaphylaxis  . Sulfasalazine Anaphylaxis  . Theophyllines Anaphylaxis  . Theophylline Swelling    CODE STATUS:  Code Status Orders        Start     Ordered   03/12/17 0824  Full code  Continuous     03/12/17 0823    Code Status History    Date Active Date Inactive Code Status Order ID Comments User Context   03/04/2017  4:05 AM 03/05/2017  6:33 PM Full Code [id number]  Arnaldo Natal, MD Inpatient   02/25/2017 11:52 PM 02/27/2017  6:06 PM DNR [id number]  Tonye Royalty, DO Inpatient   02/25/2017 10:27 PM 02/25/2017 11:52 PM Full Code [id number]  Tonye Royalty, DO Inpatient   12/21/2016  6:14 PM 12/24/2016  3:03 PM DNR [id number]  Kennedy Bucker, MD Inpatient   05/05/2016 10:17 PM 05/12/2016  6:20 PM DNR [id number]  Altamese Dilling, MD Inpatient   03/15/2016  5:42 PM 03/17/2016  6:59 PM DNR [id number]  Auburn Bilberry, MD ED   03/15/2016  5:29 PM 03/15/2016  5:42 PM Full Code [id number]  Auburn Bilberry, MD ED   01/30/2016 12:55 AM 02/03/2016 12:55 AM DNR [id number]  Gery Pray, MD Inpatient   01/13/2016  9:46 PM 01/16/2016  4:18 PM Full Code [id number]  Shaune Pollack, MD Inpatient   06/24/2015  7:13 AM 06/25/2015 12:27 PM Full Code [id number]  Milagros Loll, MD ED    Advance Directive Documentation     Most Recent Value  Type of Advance Directive  Living will  Pre-existing out of facility DNR order (yellow form or pink MOST form)  -  "MOST" Form in Place?  -      TOTAL TIME TAKING CARE OF THIS PATIENT: 45 minutes.    Katharina Caper M.D on 03/13/2017 at 5:41 PM  Between 7am to 6pm - Pager - [phone]  After 6pm go to [email or web address] - password [email or web address] [facility]  [place] [facility]  Office  [phone]  CC: Primary care physician; Mick Sell, MD

## 2017-03-13 NOTE — Consult Note (Addendum)
Hematology/Oncology Consult note [facility] Telephone:([phone] [phone]  Patient Care Team: Mick Sell, MD as PCP - General (Infectious Diseases)   Name of the patient: Brian Hampton  [id number]  11-09-1955    Reason for consult: RLE DVT on lovenox   Requesting physician: DR. Amado Coe  Date of visit: 03/13/2017    History of presenting illness- patient is a 62 yr old male with prior h/o CAD and recurrent DVT. Also had a stroke in 2000.   In Summer 2017- patient had a DVT of the left lower extremity for which he was started on Eliquis by PCP. While on anticoagulation with Eliquis- August 2017- patient  On Outside CTA- noted to have left lower lobes segmental and subsegmental PE; and also DVT of right lower extremity. Patient had IVC filter placement at the time; and discharged home on Coumadin. In July 2018 he presented with RLE DVT and was found to have recurrent DVT while his INR was therapeutic on coumadin. He was switched to lovenox 1mg /kg [place] BID which he states he has been compliant with daily  He presented with worsening RLE pain 2 days ago. Repeat doppler showed slight worsening of RLE DVT. He was initially on heparin gtt which was stopped and he is now back on lovenox.  He was also reporting some chest pain this morning and was seen by cardiology. He will be getting outpatient stress test  Patient reports that for past 45 min he has noticed progressive weakness in his right side of the body. He has some baseline weakness in his right side from prior stroke but this weakness is new  ECOG PS- 1  Pain scale- 0   Review of systems- Review of Systems  Constitutional: Positive for malaise/fatigue. Negative for chills, fever and weight loss.  HENT: Negative for congestion, ear discharge and nosebleeds.   Eyes: Negative for blurred vision.  Respiratory: Negative for cough, hemoptysis, sputum production, shortness of breath and wheezing.     Cardiovascular: Negative for chest pain, palpitations, orthopnea and claudication.  Gastrointestinal: Negative for abdominal pain, blood in stool, constipation, diarrhea, heartburn, melena, nausea and vomiting.  Genitourinary: Negative for dysuria, flank pain, frequency, hematuria and urgency.  Musculoskeletal: Negative for back pain, joint pain and myalgias.       Rle pain  Skin: Negative for rash.  Neurological: Positive for sensory change and focal weakness. Negative for dizziness, tingling, seizures, weakness and headaches.  Endo/Heme/Allergies: Does not bruise/bleed easily.  Psychiatric/Behavioral: Negative for depression and suicidal ideas. The patient does not have insomnia.     Allergies  Allergen Reactions  . Aspirin Anaphylaxis  . Bee Venom Anaphylaxis  . Penicillins Anaphylaxis and Other (See Comments)    Has patient had a PCN reaction causing immediate rash, facial/tongue/throat swelling, SOB or lightheadedness with hypotension: Yes Has patient had a PCN reaction causing severe rash involving mucus membranes or skin necrosis: No Has patient had a PCN reaction that required hospitalization No Has patient had a PCN reaction occurring within the last 10 years: Yes If all of the above answers are "NO", then may proceed with Cephalosporin use.  . Prednisone Anaphylaxis  . Sulfa Antibiotics Anaphylaxis  . Sulfasalazine Anaphylaxis  . Theophyllines Anaphylaxis  . Theophylline Swelling    Patient Active Problem List   Diagnosis Date Noted  . DVT (deep vein thrombosis) in pregnancy (HCC) 03/12/2017  . Acute deep vein thrombosis (DVT) of right lower extremity (HCC) 02/25/2017  . Postoperative pain   .  COPD exacerbation (HCC) 12/23/2016  . Abdominal pain   . Compression fracture of body of thoracic vertebra (HCC) 12/21/2016  . Hypomagnesemia 05/05/2016  . Rhabdomyolysis 05/05/2016  . Acute pulmonary embolism (HCC) 03/17/2016  . Right leg DVT (HCC) 03/17/2016  . Hypokalemia  03/17/2016  . S/P IVC filter 03/17/2016  . SOB (shortness of breath) 03/15/2016  . Dysphagia 02/02/2016  . GERD (gastroesophageal reflux disease) 02/02/2016  . Hyperthyroidism 01/31/2016  . 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 Elevated transaminase level 01/31/2016  . Anemia 01/31/2016  . Thrombocytopenia (HCC) 01/31/2016  . Pyuria 01/31/2016  . Weakness 01/29/2016  . HIV (human immunodeficiency virus infection) (HCC) 01/29/2016  . CAD (coronary artery disease) 01/29/2016  . COPD (chronic obstructive pulmonary disease) (HCC) 01/29/2016  . Diabetes mellitus (HCC) 01/29/2016  . Leg weakness, bilateral 01/13/2016  . Chest pain 06/24/2015     Past Medical History:  Diagnosis Date  . Anemia   . Asthma   . Cataracts, bilateral    worse in Rt eye  . Collagen vascular disease (HCC)   . COPD (chronic obstructive pulmonary disease) (HCC)   . Coronary artery disease   . Depression   . DVT (deep venous thrombosis) (HCC)   . Dysrhythmia   . Emphysema/COPD (HCC)   . Environmental and seasonal allergies   . H/O blood clots   . Heart murmur   . HIV (human immunodeficiency virus infection) (HCC)   . Hypertension   . Leaky heart valve    x 3  . Lung mass   . Myocardial infarction (HCC)    in 2000  . Pneumonia    year ago  . Pulmonary emboli (HCC)   . Type 2 diabetes mellitus (HCC)      Past Surgical History:  Procedure Laterality Date  . ANKLE ARTHROSCOPY    . IVC FILTER INSERTION    . KYPHOPLASTY N/A 12/21/2016   Procedure: KYPHOPLASTY;  Surgeon: Kennedy BuckerMenz, Michael, MD;  Location: [facility] ORS;  Service: Orthopedics;  Laterality: N/A;  . PERIPHERAL VASCULAR CATHETERIZATION N/A 03/16/2016   Procedure: IVC Filter Insertion;  Surgeon: Renford DillsGregory G Schnier, MD;  Location: [facility] INVASIVE CV [facility];  Service: Cardiovascular;  Laterality: N/A;  . SINUS EXPLORATION    . WRIST ARTHROSCOPY Right     Social History   Social History  . Marital status: Widowed    Spouse name: N/A  . Number of  children: N/A  . Years of education: N/A   Occupational History  . Not on file.   Social History Main Topics  . Smoking status: Former Smoker    Quit date: 12/16/2001  . Smokeless tobacco: Never Used  . Alcohol use No  . Drug use: No  . Sexual activity: Not on file   Other Topics Concern  . Not on file   Social History Narrative  . No narrative on file     Family History  Problem Relation Age of Onset  . CAD Unknown   . Asthma Mother   . Cirrhosis Father   . Deep vein thrombosis Neg Hx   . Diabetes Neg Hx      Current Facility-Administered Medications:  .  0.9 %  sodium chloride infusion, 250 mL, Intravenous, PRN, Pyreddy, Pavan, MD .  abacavir-dolutegravir-lamiVUDine (TRIUMEQ) 600-50-300 MG per tablet 1 tablet, 1 tablet, Oral, QPC breakfast, Pyreddy, Pavan, MD, 1 tablet at 03/13/17 0901 .  acetaminophen (TYLENOL) tablet 650 mg, 650 mg, Oral, Q6H PRN **OR** acetaminophen (TYLENOL) suppository 650 mg, 650 mg, Rectal, Q6H PRN,  Ihor Austin, MD .  albuterol (PROVENTIL) (2.5 MG/3ML) 0.083% nebulizer solution 2.5 mg, 2.5 mg, Nebulization, Q4H PRN, Gouru, Aruna, MD .  atorvastatin (LIPITOR) tablet 10 mg, 10 mg, Oral, QPC breakfast, Pyreddy, Pavan, MD, 10 mg at 03/13/17 0902 .  [COMPLETED] azithromycin (ZITHROMAX) tablet 500 mg, 500 mg, Oral, Daily, 500 mg at 03/13/17 1331 **FOLLOWED BY** [START ON 03/14/2017] azithromycin (ZITHROMAX) tablet 250 mg, 250 mg, Oral, Daily, Gouru, Aruna, MD .  calcium-vitamin D (OSCAL WITH D) 500-200 MG-UNIT per tablet 1 tablet, 1 tablet, Oral, QPC breakfast, Pyreddy, Pavan, MD, 1 tablet at 03/13/17 0903 .  carvedilol (COREG) tablet 12.5 mg, 12.5 mg, Oral, BID WC, Pyreddy, Pavan, MD, 12.5 mg at 03/13/17 0902 .  enoxaparin (LOVENOX) injection 85 mg, 1 mg/kg, Subcutaneous, Q12H, Gouru, Aruna, MD, 85 mg at 03/13/17 0901 .  escitalopram (LEXAPRO) tablet 20 mg, 20 mg, Oral, QPC breakfast, Pyreddy, Pavan, MD, 20 mg at 03/13/17 0902 .  furosemide (LASIX)  tablet 20 mg, 20 mg, Oral, Daily, Pyreddy, Pavan, MD, 20 mg at 03/13/17 0902 .  HYDROcodone-acetaminophen (NORCO/VICODIN) 5-325 MG per tablet 1 tablet, 1 tablet, Oral, Q4H PRN, Ihor Austin, MD, 1 tablet at 03/12/17 2017 .  ipratropium-albuterol (DUONEB) 0.5-2.5 (3) MG/3ML nebulizer solution 3 mL, 3 mL, Nebulization, Q6H, Gouru, Aruna, MD, 3 mL at 03/13/17 1337 .  ketorolac (ACULAR) 0.5 % ophthalmic solution 1 drop, 1 drop, Left Eye, QID, Pyreddy, Pavan, MD, 1 drop at 03/13/17 1332 .  levothyroxine (SYNTHROID, LEVOTHROID) tablet 50 mcg, 50 mcg, Oral, QAC breakfast, Pyreddy, Pavan, MD, 50 mcg at 03/13/17 0902 .  linaclotide (LINZESS) capsule 72 mcg, 72 mcg, Oral, QPC breakfast, Pyreddy, Pavan, MD, 72 mcg at 03/13/17 0903 .  magnesium oxide (MAG-OX) tablet 400 mg, 400 mg, Oral, QPC breakfast, Pyreddy, Pavan, MD, 400 mg at 03/13/17 0902 .  methylPREDNISolone sodium succinate (SOLU-MEDROL) 125 mg/2 mL injection 60 mg, 60 mg, Intravenous, Q8H, Gouru, Aruna, MD .  mirtazapine (REMERON) tablet 15 mg, 15 mg, Oral, QPM, Pyreddy, Pavan, MD, 15 mg at 03/12/17 1741 .  mometasone-formoterol (DULERA) 200-5 MCG/ACT inhaler 2 puff, 2 puff, Inhalation, BID, Gouru, Aruna, MD, 2 puff at 03/13/17 0903 .  morphine 2 MG/ML injection 2 mg, 2 mg, Intravenous, Q4H PRN, Gouru, Aruna, MD, 2 mg at 03/13/17 1420 .  nitroGLYCERIN (NITROSTAT) SL tablet 0.4 mg, 0.4 mg, Sublingual, Q5 min PRN, Pyreddy, Pavan, MD .  ofloxacin (OCUFLOX) 0.3 % ophthalmic solution 1 drop, 1 drop, Left Eye, QID, Pyreddy, Pavan, MD, 1 drop at 03/13/17 1332 .  ondansetron (ZOFRAN) tablet 4 mg, 4 mg, Oral, Q6H PRN **OR** ondansetron (ZOFRAN) injection 4 mg, 4 mg, Intravenous, Q6H PRN, Pyreddy, Pavan, MD .  oxyCODONE (Oxy IR/ROXICODONE) immediate release tablet 10 mg, 10 mg, Oral, Q4H PRN, Gouru, Aruna, MD, 10 mg at 03/12/17 2017 .  pantoprazole (PROTONIX) EC tablet 40 mg, 40 mg, Oral, BID AC, Gouru, Aruna, MD .  polyethylene glycol (MIRALAX / GLYCOLAX)  packet 17 g, 17 g, Oral, Daily, Gouru, Aruna, MD, 17 g at 03/13/17 0901 .  potassium chloride (K-DUR,KLOR-CON) CR tablet 10 mEq, 10 mEq, Oral, Daily, Pyreddy, Pavan, MD, 10 mEq at 03/13/17 0903 .  senna-docusate (Senokot-S) tablet 1 tablet, 1 tablet, Oral, QHS PRN, Pyreddy, Pavan, MD .  sodium chloride flush (NS) 0.9 % injection 3 mL, 3 mL, Intravenous, Q12H, Pyreddy, Pavan, MD, 3 mL at 03/12/17 2020 .  sodium chloride flush (NS) 0.9 % injection 3 mL, 3 mL, Intravenous, PRN, Pyreddy, Pavan, MD .  zolpidem (  AMBIEN) tablet 5 mg, 5 mg, Oral, QHS, Pyreddy, Pavan, MD, 5 mg at 03/12/17 2017   Physical exam:  Vitals:   03/12/17 2342 03/13/17 0745 03/13/17 0913 03/13/17 1425  BP: 112/79 (!) 140/94  129/81  Pulse: 67 85 84 74  Resp: 16 18 17    Temp: 98.2 F (36.8 C) 98.8 F (37.1 C)  98.4 F (36.9 C)  TempSrc: Oral Oral  Oral  SpO2: 98% 96% 96% 98%  Weight:      Height:       Physical Exam  Constitutional: He is oriented to person, place, and time and well-developed, well-nourished, and in no distress.  HENT:  Head: Normocephalic and atraumatic.  Eyes: Pupils are equal, round, and reactive to light. EOM are normal.  Neck: Normal range of motion.  Cardiovascular: Normal rate, regular rhythm and normal heart sounds.   Pulmonary/Chest: Effort normal and breath sounds normal.  Abdominal: Soft. Bowel sounds are normal.  Scattered abdominal bruising noted.  Neurological: He is alert and oriented to person, place, and time.  Power 4/5 in RUE and RLE. Reports no sensation in right half of the body. Detailed cranial nerve exam not done. No facial nerve palsy noted.   Skin: Skin is warm and dry.       CMP Latest Ref Rng & Units 03/13/2017  Glucose 65 - 99 mg/dL 960(A119(H)  BUN 6 - 20 mg/dL 17  Creatinine 5.400.61 - 9.811.24 mg/dL 1.911.16  Sodium 478135 - 295145 mmol/L 140  Potassium 3.5 - 5.1 mmol/L 3.9  Chloride 101 - 111 mmol/L 110  CO2 22 - 32 mmol/L 25  Calcium 8.9 - 10.3 mg/dL 6.2(Z8.7(L)  Total Protein 6.5 -  8.1 g/dL -  Total Bilirubin 0.3 - 1.2 mg/dL -  Alkaline Phos 38 - 308126 U/L -  AST 15 - 41 U/L -  ALT 17 - 63 U/L -   CBC Latest Ref Rng & Units 03/13/2017  WBC 3.8 - 10.6 K/uL 5.4  Hemoglobin 13.0 - 18.0 g/dL 11.3(L)  Hematocrit 40.0 - 52.0 % 32.9(L)  Platelets 150 - 440 K/uL 269    @IMAGES @  Ct Abdomen Pelvis Wo Contrast  Result Date: 03/04/2017 CLINICAL DATA:  Diffuse abdominal pain, nausea and vomiting for the past week. EXAM: CT ABDOMEN AND PELVIS WITHOUT CONTRAST TECHNIQUE: Multidetector CT imaging of the abdomen and pelvis was performed following the standard protocol without IV contrast. COMPARISON:  12/22/2016. FINDINGS: Lower chest: Minimal bilateral dependent atelectasis. Hepatobiliary: No focal liver abnormality is seen. No gallstones, gallbladder wall thickening, or biliary dilatation. Pancreas: Unremarkable. No pancreatic ductal dilatation or surrounding inflammatory changes. Spleen: Normal in size without focal abnormality. Adrenals/Urinary Tract: Adrenal glands are unremarkable. Kidneys are normal, without renal calculi, focal lesion, or hydronephrosis. Bladder is unremarkable. Stomach/Bowel: Stomach is within normal limits. Appendix appears normal. No evidence of bowel wall thickening, distention, or inflammatory changes. Vascular/Lymphatic: Mild iliac femoral artery calcifications without aneurysm. Inferior vena cava filter. No enlarged lymph nodes. Reproductive: Prostate is unremarkable. Other: Small amount of subcutaneous air inferiorly on the right, compatible with recent injection. Small umbilical hernia containing fat. Musculoskeletal: Old, healed right rib fractures. Lumbar and lower thoracic spine degenerative changes. IMPRESSION: No acute abnormality. Electronically Signed   By: Beckie SaltsSteven  Reid M.D.   On: 03/04/2017 18:30   Dg Chest 2 View  Result Date: 03/13/2017 CLINICAL DATA:  Former smoker hx of COPD, asthma, CAD admitted with multiple blood clots (per patient). EXAM: CHEST   2 VIEW COMPARISON:  Chest x-ray dated  11/26/2016. Chest CT dated 03/03/2017. FINDINGS: Heart size and mediastinal contours are within normal limits. Lungs appear clear. No pleural effusion or pneumothorax seen. Stable mild compression deformity of a mid thoracic vertebral body. No acute or suspicious osseous finding seen. IVC filter noted in the upper abdomen. IMPRESSION: No active cardiopulmonary disease. No evidence of pneumonia or pulmonary edema. Electronically Signed   By: Bary Richard M.D.   On: 03/13/2017 11:27   Ct Angio Chest Pe W Or Wo Contrast  Result Date: 03/03/2017 CLINICAL DATA:  Severe chest pain history of multiple DVT EXAM: CT ANGIOGRAPHY CHEST WITH CONTRAST TECHNIQUE: Multidetector CT imaging of the chest was performed using the standard protocol during bolus administration of intravenous contrast. Multiplanar CT image reconstructions and MIPs were obtained to evaluate the vascular anatomy. CONTRAST:  75 mL Isovue 370 intravenous COMPARISON:  02/25/2017, ultrasound 02/25/2017, CT chest 11/26/2016 FINDINGS: Cardiovascular: Satisfactory opacification of the pulmonary arteries to the segmental level. No evidence of pulmonary embolism. Linear web like defect within left lower lobe segmental arterial branches unchanged. Non aneurysmal aorta. Common origin of brachiocephalic and left common carotid vessels. No aneurysmal dilatation. Coronary artery calcification. Normal heart size. No pericardial effusion. Mediastinum/Nodes: No enlarged mediastinal, hilar, or axillary lymph nodes. Thyroid gland, trachea, and esophagus demonstrate no significant findings. Lungs/Pleura: Lungs are clear. No pleural effusion or pneumothorax. Upper Abdomen: No acute abnormality. Musculoskeletal: Post augmentation changes at T6. Partial anterior fusion at T4-T5. Chronic superior endplate deformity at T8. Old right rib fractures. Review of the MIP images confirms the above findings. IMPRESSION: 1. No acute pulmonary  embolus, aneurysm or dissection is seen 2. Clear lung fields Electronically Signed   By: Jasmine Pang M.D.   On: 03/03/2017 21:37   Ct Angio Chest Pe W And/or Wo Contrast  Result Date: 02/25/2017 CLINICAL DATA:  Right leg DVT.  Chest pain and dyspnea x1 week. EXAM: CT ANGIOGRAPHY CHEST WITH CONTRAST TECHNIQUE: Multidetector CT imaging of the chest was performed using the standard protocol during bolus administration of intravenous contrast. Multiplanar CT image reconstructions and MIPs were obtained to evaluate the vascular anatomy. CONTRAST:  75 cc Isovue 370 IV COMPARISON:  11/26/2016 CT FINDINGS: Cardiovascular: Satisfactory opacification of the pulmonary arteries to the subsegmental level. No evidence of pulmonary embolism. Normal heart size. Coronary arterial calcifications along the LAD. No pericardial effusion. No aortic aneurysm or dissection. Two vessel takeoff the aortic arch with common origin of the right brachiocephalic and left common carotid arteries. Mediastinum/Nodes: No enlarged mediastinal, hilar, or axillary lymph nodes. Thyroid gland, trachea, and esophagus demonstrate no significant findings. Lungs/Pleura: Lungs demonstrate dependent atelectasis bilaterally. Tiny subpleural bleb in the right upper lobe with a few scattered bowel tiny nonspecific subpleural densities possibly representing chronic postinfectious or postinflammatory change or atelectasis. No pleural effusion or pneumothorax. Upper Abdomen: No acute abnormality.  Tip of an IVC filter is seen. Musculoskeletal: No chest wall abnormality. No acute or significant osseous findings. Old left-sided healed rib fractures. Old T6 fracture with augmentation and chronic moderate T8 compression fractures. T12-L1 degenerative disc disease with vacuum disc. Review of the MIP images confirms the above findings. IMPRESSION: 1. No acute pulmonary embolus or aortic aneurysm.  No dissection. 2. Coronary arteriosclerosis. 3. No active  cardiopulmonary disease. Electronically Signed   By: Tollie Eth M.D.   On: 02/25/2017 20:57   US Venous Img Lower Bilateral  Result Date: 03/12/2017 CLINICAL DATA:  Hospitalized 2 weeks ago for right leg DVT. Assess deep venous thrombosis. Initial encounter. EXAM: BILATERAL  LOWER EXTREMITY VENOUS DOPPLER ULTRASOUND TECHNIQUE: Gray-scale sonography with graded compression, as well as color Doppler and duplex ultrasound were performed to evaluate the lower extremity deep venous systems from the level of the common femoral vein and including the common femoral, femoral, profunda femoral, popliteal and calf veins including the posterior tibial, peroneal and gastrocnemius veins when visible. The superficial great saphenous vein was also interrogated. Spectral Doppler was utilized to evaluate flow at rest and with distal augmentation maneuvers in the common femoral, femoral and popliteal veins. COMPARISON:  Right lower extremity venous Doppler ultrasound performed 02/25/2017 FINDINGS: RIGHT LOWER EXTREMITY Common Femoral Vein: No evidence of thrombus. Normal compressibility, respiratory phasicity and response to augmentation. Saphenofemoral Junction: No evidence of thrombus. Normal compressibility and flow on color Doppler imaging. Profunda Femoral Vein: Nonocclusive thrombus is noted within the profunda femoral vein, new from the prior study. Femoral Vein: Occlusive thrombus is noted within the femoral vein, similar to the prior study. Popliteal Vein: Occlusive thrombus noted within the popliteal vein. Calf Veins: Nonocclusive thrombus is noted within the posterior tibial vein. The peroneal vein is patent, new from the prior study. Superficial Great Saphenous Vein: No evidence of thrombus. Normal compressibility and flow on color Doppler imaging. Venous Reflux:  None. Other Findings:  None. LEFT LOWER EXTREMITY Common Femoral Vein: No evidence of thrombus. Normal compressibility, respiratory phasicity and response to  augmentation. Saphenofemoral Junction: No evidence of thrombus. Normal compressibility and flow on color Doppler imaging. Profunda Femoral Vein: No evidence of thrombus. Normal compressibility and flow on color Doppler imaging. Femoral Vein: No evidence of thrombus. Normal compressibility, respiratory phasicity and response to augmentation. Popliteal Vein: No evidence of thrombus. Normal compressibility, respiratory phasicity and response to augmentation. Calf Veins: No evidence of thrombus. Normal compressibility and flow on color Doppler imaging. Superficial Great Saphenous Vein: No evidence of thrombus. Normal compressibility and flow on color Doppler imaging. Venous Reflux:  None. Other Findings:  None. IMPRESSION: 1. Deep venous thrombosis noted within the right profunda femoral vein, femoral vein, popliteal vein and posterior tibial vein, with occlusion at the mid to distal femoral vein and proximal popliteal vein. This has progressed slightly proximally since the prior study. 2. No evidence of DVT at the left lower extremity. These results were called by telephone at the time of interpretation on 03/12/2017 at 5:40 am to Dr. Merrily Brittle, who verbally acknowledged these results. Electronically Signed   By: Roanna Raider M.D.   On: 03/12/2017 05:42   US Venous Img Lower Unilateral Right  Result Date: 02/25/2017 CLINICAL DATA:  Initial evaluation for acute leg pain. EXAM: Right LOWER EXTREMITY VENOUS DOPPLER ULTRASOUND TECHNIQUE: Gray-scale sonography with graded compression, as well as color Doppler and duplex ultrasound were performed to evaluate the lower extremity deep venous systems from the level of the common femoral vein and including the common femoral, femoral, profunda femoral, popliteal and calf veins including the posterior tibial, peroneal and gastrocnemius veins when visible. The superficial great saphenous vein was also interrogated. Spectral Doppler was utilized to evaluate flow at rest  and with distal augmentation maneuvers in the common femoral, femoral and popliteal veins. COMPARISON:  Prior ultrasound from 01/13/2006. FINDINGS: Contralateral Common Femoral Vein: Respiratory phasicity is normal and symmetric with the symptomatic side. No evidence of thrombus. Normal compressibility. Common Femoral Vein: No evidence of thrombus. Normal compressibility, respiratory phasicity and response to augmentation. Saphenofemoral Junction: No evidence of thrombus. Normal compressibility and flow on color Doppler imaging. Profunda Femoral Vein: No evidence of thrombus. Normal  compressibility and flow on color Doppler imaging. Femoral Vein: The femoral vein/SFV is duplicated. Occlusive thrombus within 1 set of the right femoral vein beginning at the proximal mid thigh. Loss of normal compressibility. Popliteal Vein: Occlusive thrombus present throughout the popliteal vein with loss of normal compressibility. Calf Veins: Occlusive thrombus within the right posterior tibial and peroneal veins with loss of normal compressibility. Superficial Great Saphenous Vein: No evidence of thrombus. Normal compressibility and flow on color Doppler imaging. IMPRESSION: Positive study for DVT with occlusive thrombus involving the right femoral and popliteal veins as well as the veins of the calf, extending from the proximal- mid right thigh through to the ankle. Critical Value/emergent results were called by telephone at the time of interpretation on 02/25/2017 at 6:14 pm to Dr. Ileana Roup , who verbally acknowledged these results. Electronically Signed   By: Rise Mu M.D.   On: 02/25/2017 18:18    Assessment and plan- Patient is a 61 y.o. male with recurrent RLE DVT and PE currently on lovenox  Anti Xa level 4 hours after 3rd dose of lovenox was 2.0 which is actually supra therapeutic. He is currently receiving 85 mg Titanic BID. Patient was on 80 mg bid as an outpatient (per outpatient med dosing). I would  think that 80 mg dose would have kept him therapeutic if 85 is making it supra therapeutic although it has not been checked by anti Xa levels. I would therefore recommend switching him to heparin gtt at this time (start at 11 pm. 2 hours after next dose of lovenox due) and see if insurance would cover pradaxa as an outpatient. Arixtra is another option. I do not think re challenging with lovenox would be a good option. Per Dr. Rob Hickman note- vascular surgery not considering thrombolysis.   I am concerned about patients current symptoms of right sided weakness. He is going for stat CT scan and will be evaluated by on call physician thereafter. I have informed Dr. Amado Coe about his symptoms as well.  Dr. Donneta Romberg will follow this patient in am    Visit Diagnosis 1. Acute deep vein thrombosis (DVT) of other specified vein of right lower extremity (HCC)   2. Cough   3. Abdominal pain     Dr. Owens Shark, MD, MPH [facility] at [facility] Pager- [phone] 03/13/2017

## 2017-03-13 NOTE — Progress Notes (Signed)
Patient resting in bed, no complaints. VSS. Continue to monitor.

## 2017-03-13 NOTE — Progress Notes (Signed)
Central telemetry notified this writer of 6 beat run of PAT, MD notified.

## 2017-03-13 NOTE — Progress Notes (Signed)
Patient complaining of coughing, and pain while coughing. Dr Amado CoeGouru paged. O2 sats stable. VSS. Dr Amado CoeGouru will round on patient and place orders soon.

## 2017-03-13 NOTE — Progress Notes (Signed)
Upon assessment once patient came back from CT scan and Angio. He is alert and oriented. Answers questions appropriately. His right upper arm is weaker then his left. He states that he cannot feel his right leg but has right knee pain when assessed and moved. Dr V came in and assessed patient as well. She does not believe patient is having a stroke. Neuro consult entered. Patient NPO for Ultrasound procedure. Per Hematologist Dr Orson Apeoe does want patient to start heparin drip per pharm dosing. Pharmacy has timed it in accordance to patients last Lovenox. No additional orders at this time.

## 2017-03-13 NOTE — Progress Notes (Addendum)
ANTICOAGULATION CONSULT NOTE - Initial Consult  Pharmacy Consult for Heparin drip Indication: DVT  Allergies  Allergen Reactions  . Aspirin Anaphylaxis  . Bee Venom Anaphylaxis  . Penicillins Anaphylaxis and Other (See Comments)    Has patient had a PCN reaction causing immediate rash, facial/tongue/throat swelling, SOB or lightheadedness with hypotension: Yes Has patient had a PCN reaction causing severe rash involving mucus membranes or skin necrosis: No Has patient had a PCN reaction that required hospitalization No Has patient had a PCN reaction occurring within the last 10 years: Yes If all of the above answers are "NO", then may proceed with Cephalosporin use.  . Prednisone Anaphylaxis  . Sulfa Antibiotics Anaphylaxis  . Sulfasalazine Anaphylaxis  . Theophyllines Anaphylaxis  . Theophylline Swelling    Patient Measurements: Height: 5\' 5"  (165.1 cm) Weight: 192 lb (87.1 kg) IBW/kg (Calculated) : 61.5 Heparin Dosing Weight: 79.9 kg  Vital Signs: Temp: 98.4 F (36.9 C) (08/05 1425) Temp Source: Oral (08/05 1425) BP: 129/81 (08/05 1425) Pulse Rate: 74 (08/05 1425)  Labs:  Recent Labs  03/12/17 0321 03/12/17 0653 03/13/17 0337 03/13/17 1421  HGB 12.0*  --  11.3*  --   HCT 35.3*  --  32.9*  --   PLT 308  --  269  --   APTT 33  --   --   --   LABPROT 13.0  --   --   --   INR 0.98  --   --   --   HEPARINUNFRC  --  2.12*  --   --   HEPRLOWMOCWT  --   --   --  2.00  CREATININE 1.16  --  1.16  --   TROPONINI <0.03  --   --   --     Estimated Creatinine Clearance: 69.5 mL/min (by C-G formula based on SCr of 1.16 mg/dL).   Medical History: Past Medical History:  Diagnosis Date  . Anemia   . Asthma   . Cataracts, bilateral    worse in Rt eye  . Collagen vascular disease (HCC)   . COPD (chronic obstructive pulmonary disease) (HCC)   . Coronary artery disease   . Depression   . DVT (deep venous thrombosis) (HCC)   . Dysrhythmia   . Emphysema/COPD (HCC)    . Environmental and seasonal allergies   . H/O blood clots   . Heart murmur   . HIV (human immunodeficiency virus infection) (HCC)   . Hypertension   . Leaky heart valve    x 3  . Lung mass   . Myocardial infarction (HCC)    in 2000  . Pneumonia    year ago  . Pulmonary emboli (HCC)   . 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   See Hematology MD note 8/5: Developed DVT/PE while on Apixaban august 2017, was discharged on Warfarin. July 2018 had recurrent DVT while INR was therapeutic and has IVC filter. He was switched at that time to Lovenox 1mg /kg bid.  Goal of Therapy:  Heparin level 0.3-0.7 units/ml aPTT 66-102 seconds Monitor platelets by anticoagulation protocol: Yes   Plan:  Next Lovenox dose was due at 2100. Per discussion with Dr. Smith Robertao, will start Heparin drip at 2300 (AntiXa level for lovenox was elevated at 2.0 units/ml so will start Heparin drip a little later and no  bolus). Start Heparin drip at 2300 03/13/17 at 1400 units/hr Check AntiXa (Heparin level) in 6 hours and daily while on Heparin. Continue to monitor H&H and platelets. Will check aPTT since patient has been on Lovenox.  If aPTT and Heparin level correlate then can continue with just Heparin levels. If they do not correlate would follow aPTTs and check Heparin level until they both correlate   Note: Patient having Head CT to r/o bleeding etc. Would not start Heparin Drip until results of CT are known.   Dell Briner A 03/13/2017,4:32 PM

## 2017-03-13 NOTE — Progress Notes (Signed)
Patient c/o new onset numbness in right side of body, right hand grip strength weaker than left, and cannot stand due to numbness. DR paged, ct head stat.

## 2017-03-13 NOTE — Progress Notes (Signed)
[facility] [place] at [facility]   PATIENT NAME: Brian Hampton    MR#:  [id number]  DATE OF BIRTH:  07-14-1956  SUBJECTIVE:  CHIEF COMPLAINT:  Patient is complaining of Coughing and wheezing today has history of COPD quit smoking several years ago, right leg pain is manageable  REVIEW OF SYSTEMS:  CONSTITUTIONAL: No fever, fatigue or weakness.  EYES: No blurred or double vision.  EARS, NOSE, AND THROAT: No tinnitus or ear pain.  RESPIRATORY: Reports cough, shortness of breath, wheezing , denies hemoptysis.  CARDIOVASCULAR: No chest pain, orthopnea, edema.  GASTROINTESTINAL: No nausea, vomiting, diarrhea or abdominal pain.  GENITOURINARY: No dysuria, hematuria.  ENDOCRINE: No polyuria, nocturia,  HEMATOLOGY: No anemia, easy bruising or bleeding SKIN: No rash or lesion. MUSCULOSKELETAL: Right leg pain No joint pain or arthritis.   NEUROLOGIC: No tingling, numbness, weakness.  PSYCHIATRY: No anxiety or depression.   DRUG ALLERGIES:   Allergies  Allergen Reactions  . Aspirin Anaphylaxis  . Bee Venom Anaphylaxis  . Penicillins Anaphylaxis and Other (See Comments)    Has patient had a PCN reaction causing immediate rash, facial/tongue/throat swelling, SOB or lightheadedness with hypotension: Yes Has patient had a PCN reaction causing severe rash involving mucus membranes or skin necrosis: No Has patient had a PCN reaction that required hospitalization No Has patient had a PCN reaction occurring within the last 10 years: Yes If all of the above answers are "NO", then may proceed with Cephalosporin use.  . Prednisone Anaphylaxis  . Sulfa Antibiotics Anaphylaxis  . Sulfasalazine Anaphylaxis  . Theophyllines Anaphylaxis  . Theophylline Swelling    VITALS:  Blood pressure (!) 140/94, pulse 84, temperature 98.8 F (37.1 C), temperature source Oral, resp. rate 17, height 5\' 5"  (1.651 m), weight 87.1 kg (192 lb), SpO2 96 %.  PHYSICAL EXAMINATION:  GENERAL:   61 y.o.-year-old patient lying in the bed with no acute distress.  EYES: Pupils equal, round, reactive to light and accommodation. No scleral icterus. Extraocular muscles intact.  HEENT: Head atraumatic, normocephalic. Oropharynx and nasopharynx clear.  NECK:  Supple, no jugular venous distention. No thyroid enlargement, no tenderness.  LUNGS: mod breath sounds bilaterally, min. Wheezing, no rales,rhonchi or crepitation. No use of accessory muscles of respiration.  CARDIOVASCULAR: S1, S2 normal. No murmurs, rubs, or gallops.  ABDOMEN: Soft, nontender, nondistended. Bowel sounds present. No organomegaly or mass.  EXTREMITIES: Right calf tenderness is present , right lower extremity is edematous No pedal edema, cyanosis, or clubbing.  NEUROLOGIC: Cranial nerves II through XII are intact. Muscle strength 5/5 in all extremities. Sensation intact. Gait not checked.  PSYCHIATRIC: The patient is alert and oriented x 3.  SKIN: No obvious rash, lesion, or ulcer.    LABORATORY PANEL:   CBC  Recent Labs Lab 03/13/17 0337  WBC 5.4  HGB 11.3*  HCT 32.9*  PLT 269   ------------------------------------------------------------------------------------------------------------------  Chemistries   Recent Labs Lab 03/13/17 0337  NA 140  K 3.9  CL 110  CO2 25  GLUCOSE 119*  BUN 17  CREATININE 1.16  CALCIUM 8.7*   ------------------------------------------------------------------------------------------------------------------  Cardiac Enzymes  Recent Labs Lab 03/12/17 0321  TROPONINI <0.03   ------------------------------------------------------------------------------------------------------------------  RADIOLOGY:  Dg Chest 2 View  Result Date: 03/13/2017 CLINICAL DATA:  Former smoker hx of COPD, asthma, CAD admitted with multiple blood clots (per patient). EXAM: CHEST  2 VIEW COMPARISON:  Chest x-ray dated 11/26/2016. Chest CT dated 03/03/2017. FINDINGS: Heart size and mediastinal  contours are within normal limits. Lungs  appear clear. No pleural effusion or pneumothorax seen. Stable mild compression deformity of a mid thoracic vertebral body. No acute or suspicious osseous finding seen. IVC filter noted in the upper abdomen. IMPRESSION: No active cardiopulmonary disease. No evidence of pneumonia or pulmonary edema. Electronically Signed   By: Bary RichardStan  Maynard M.D.   On: 03/13/2017 11:27   [place] Venous Img Lower Bilateral  Result Date: 03/12/2017 CLINICAL DATA:  Hospitalized 2 weeks ago for right leg DVT. Assess deep venous thrombosis. Initial encounter. EXAM: BILATERAL LOWER EXTREMITY VENOUS DOPPLER ULTRASOUND TECHNIQUE: Gray-scale sonography with graded compression, as well as color Doppler and duplex ultrasound were performed to evaluate the lower extremity deep venous systems from the level of the common femoral vein and including the common femoral, femoral, profunda femoral, popliteal and calf veins including the posterior tibial, peroneal and gastrocnemius veins when visible. The superficial great saphenous vein was also interrogated. Spectral Doppler was utilized to evaluate flow at rest and with distal augmentation maneuvers in the common femoral, femoral and popliteal veins. COMPARISON:  Right lower extremity venous Doppler ultrasound performed 02/25/2017 FINDINGS: RIGHT LOWER EXTREMITY Common Femoral Vein: No evidence of thrombus. Normal compressibility, respiratory phasicity and response to augmentation. Saphenofemoral Junction: No evidence of thrombus. Normal compressibility and flow on color Doppler imaging. Profunda Femoral Vein: Nonocclusive thrombus is noted within the profunda femoral vein, new from the prior study. Femoral Vein: Occlusive thrombus is noted within the femoral vein, similar to the prior study. Popliteal Vein: Occlusive thrombus noted within the popliteal vein. Calf Veins: Nonocclusive thrombus is noted within the posterior tibial vein. The peroneal vein is  patent, new from the prior study. Superficial Great Saphenous Vein: No evidence of thrombus. Normal compressibility and flow on color Doppler imaging. Venous Reflux:  None. Other Findings:  None. LEFT LOWER EXTREMITY Common Femoral Vein: No evidence of thrombus. Normal compressibility, respiratory phasicity and response to augmentation. Saphenofemoral Junction: No evidence of thrombus. Normal compressibility and flow on color Doppler imaging. Profunda Femoral Vein: No evidence of thrombus. Normal compressibility and flow on color Doppler imaging. Femoral Vein: No evidence of thrombus. Normal compressibility, respiratory phasicity and response to augmentation. Popliteal Vein: No evidence of thrombus. Normal compressibility, respiratory phasicity and response to augmentation. Calf Veins: No evidence of thrombus. Normal compressibility and flow on color Doppler imaging. Superficial Great Saphenous Vein: No evidence of thrombus. Normal compressibility and flow on color Doppler imaging. Venous Reflux:  None. Other Findings:  None. IMPRESSION: 1. Deep venous thrombosis noted within the right profunda femoral vein, femoral vein, popliteal vein and posterior tibial vein, with occlusion at the mid to distal femoral vein and proximal popliteal vein. This has progressed slightly proximally since the prior study. 2. No evidence of DVT at the left lower extremity. These results were called by telephone at the time of interpretation on 03/12/2017 at 5:40 am to Dr. Merrily BrittleNEIL RIFENBARK, who verbally acknowledged these results. Electronically Signed   By: Roanna RaiderJeffery  Chang M.D.   On: 03/12/2017 05:42    EKG:   Orders placed or performed during the hospital encounter of 03/12/17  . ED EKG within 10 minutes  . ED EKG within 10 minutes    ASSESSMENT AND PLAN:    61 year old male patient with history of pulmonary embolism, recurrent DVT lower extremity, COPD, bronchial asthma, coronary artery disease presented to the emergency room  with right lower extremity pain and swelling.  1. 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 Depending on the anti-10 A levels patient will be discharged home with higher dose of Lovenox or pradaxa Follow-up with oncology, discussed with Dr. Smith Robert  2. Right lower extremity pain Pain management as needed and oxycodone for moderate pain and morphine as needed for severe pain  3. AECOPD IV Solu-Medrol and nebulizer treatments, patient allergies reports anaphylaxis to prednisone but patient reports that he tolerated prednisone well in the past, and comfortable with IV Solu-Medrol Will closely monitor patient after giving IV solum Medrol Chest x-ray negative  4.  chest pain with a history of Coronary artery disease Chest pain is most likely noncardiac from cardiology standpoint and is scheduled for nuclear stress test on Thursday at Dr. Milta Deiters office Chest x-ray negative.  PPI twice a day Continue Coreg, Lipitor and home medication Lasix   5. Hypokalemia replete as needed and recheck       All the records are reviewed and case discussed with Care Management/Social Workerr. Management plans discussed with the patient, family and they are in agreement.  CODE STATUS: fc  TOTAL TIME TAKING CARE OF THIS PATIENT: .   POSSIBLE D/C IN 2 DAYS, DEPENDING ON CLINICAL CONDITION.  Note: This dictation was prepared with Dragon dictation along with smaller phrase technology. Any transcriptional errors that result from this process are unintentional.   Ramonita Lab M.D on 03/13/2017 at 12:23 PM  Between 7am to 6pm - Pager - [phone] After 6pm go to [email or web address] - password [email or web address] [facility]  [place] [place]  Office   [phone]  CC: Primary care physician; Mick Sell, MD

## 2017-03-13 NOTE — Progress Notes (Signed)
Patient complaining of abdominal pain on left and right side. Still complaining of expiratory wheezing. Dr Amado CoeGouru paged.

## 2017-03-13 NOTE — Progress Notes (Signed)
Patient returned to room from ultrasound of abdomen, family member came to desk requesting drink for patient. Patient currently order NPO. Upon entering room patient is eating burger and fries. Writer educated patient on current diet ordered. patient states another RN told him he could eat after 1900. MD called, orders to be placed.

## 2017-03-13 NOTE — Progress Notes (Signed)
ANTICOAGULATION CONSULT NOTE   Pharmacy Consult for enoxaparin Indication: DVT and hx of PE  Allergies  Allergen Reactions  . Aspirin Anaphylaxis  . Bee Venom Anaphylaxis  . Penicillins Anaphylaxis and Other (See Comments)    Has patient had a PCN reaction causing immediate rash, facial/tongue/throat swelling, SOB or lightheadedness with hypotension: Yes Has patient had a PCN reaction causing severe rash involving mucus membranes or skin necrosis: No Has patient had a PCN reaction that required hospitalization No Has patient had a PCN reaction occurring within the last 10 years: Yes If all of the above answers are "NO", then may proceed with Cephalosporin use.  . Prednisone Anaphylaxis  . Sulfa Antibiotics Anaphylaxis  . Sulfasalazine Anaphylaxis  . Theophyllines Anaphylaxis  . Theophylline Swelling    Patient Measurements: Height: 5\' 5"  (165.1 cm) Weight: 192 lb (87.1 kg) IBW/kg (Calculated) : 61.5   Vital Signs: Temp: 98.4 F (36.9 C) (08/05 1425) Temp Source: Oral (08/05 1425) BP: 129/81 (08/05 1425) Pulse Rate: 74 (08/05 1425)  Labs:  Recent Labs  03/12/17 0321 03/12/17 0653 03/13/17 0337 03/13/17 1421  HGB 12.0*  --  11.3*  --   HCT 35.3*  --  32.9*  --   PLT 308  --  269  --   APTT 33  --   --   --   LABPROT 13.0  --   --   --   INR 0.98  --   --   --   HEPARINUNFRC  --  2.12*  --   --   HEPRLOWMOCWT  --   --   --  2.00  CREATININE 1.16  --  1.16  --   TROPONINI <0.03  --   --   --     Estimated Creatinine Clearance: 69.5 mL/min (by C-G formula based on SCr of 1.16 mg/dL).    Assessment: 61 yo male with hx of PE and new DVT initially on heparin drip transitioning to enoxaparin. (Enoxaparin PTA). Per protocol, enoxaparin to start one hour after discontinuing heparin drip.   Goal of Therapy:  Anti-Xa level 0.6-1 units/ml 4hrs after LMWH dose given Monitor platelets by anticoagulation protocol: Yes   Plan:  Lovenox 1 mg/kg SQ q12h to start one  hour after heparin drip has been stopped - talked with RN and informed her of plan.  Per MD request, checking factor anti-Xa level 4h after first dose (see consult) SCr and CBC at least every three days  Discussed with Dr. Amado CoeGouru that recommendation is to check anti-Xa level 4h after third dose of enoxaparin. Per Dr. Amado CoeGouru, she is working with Dr. Smith Robertao to determine anticoagulant dose and which anticoagulant to d/c pt home on. If questions arise on how to adjust dose based on anti-Xa level, please ask Dr. Amado CoeGouru.   8/5: AntiXa level= 2.0 units/ml. (goal 0.6-1.0 units/ml). Discussed with Dr. Smith Robertao (Hematology). MD aware patient on Lovenox 85mg  bid and patient was on 80mg  bid at home. Dr. Smith Robertao to decide what option to continue with for anticaogulation as it appears patient was likely on therapeutic dose of Lovenox and had recurrent DVT.   Pharmacy will continue to follow.  Massiah Minjares A 03/13/2017,3:53 PM

## 2017-03-13 NOTE — Progress Notes (Signed)
Patient being transported to x-ray.

## 2017-03-13 NOTE — Consult Note (Signed)
Brian Hampton is a 61 y.o. male  [id number]  Primary Cardiologist: Adrian Blackwater Reason for Consultation: Chest pain and right leg pain  HPI: This is a 61 year old white male with a past medical history of multiple medical problems including coronary artery disease but nonobstructive presented to the hospital with chest pain and right leg pain.   Review of Systems: No orthopnea PND or leg swelling   Past Medical History:  Diagnosis Date  . Anemia   . Asthma   . Cataracts, bilateral    worse in Rt eye  . Collagen vascular disease (HCC)   . COPD (chronic obstructive pulmonary disease) (HCC)   . Coronary artery disease   . Depression   . DVT (deep venous thrombosis) (HCC)   . Dysrhythmia   . Emphysema/COPD (HCC)   . Environmental and seasonal allergies   . H/O blood clots   . Heart murmur   . HIV (human immunodeficiency virus infection) (HCC)   . Hypertension   . Leaky heart valve    x 3  . Lung mass   . Myocardial infarction (HCC)    in 2000  . Pneumonia    year ago  . Pulmonary emboli (HCC)   . Type 2 diabetes mellitus (HCC)     Medications Prior to Admission  Medication Sig Dispense Refill  . abacavir-dolutegravir-lamiVUDine (TRIUMEQ) 600-50-300 MG tablet Take 1 tablet by mouth daily after breakfast. Reported on 01/30/2016    . albuterol (PROVENTIL HFA;VENTOLIN HFA) 108 (90 BASE) MCG/ACT inhaler Inhale 2 puffs into the lungs every 6 (six) hours as needed for wheezing or shortness of breath.    Marland Kitchen albuterol (PROVENTIL) (2.5 MG/3ML) 0.083% nebulizer solution Take 2.5 mg by nebulization 3 (three) times daily.     Marland Kitchen atorvastatin (LIPITOR) 10 MG tablet Take 10 mg by mouth daily after breakfast.    . Calcium Carbonate-Vitamin D (CALCIUM 600+D) 600-400 MG-UNIT tablet Take 1 tablet by mouth daily after breakfast.     . carvedilol (COREG) 12.5 MG tablet Take 12.5 mg by mouth 2 (two) times daily with a meal.    . cetirizine (ZYRTEC) 10 MG tablet Take 10 mg by mouth daily as  needed for allergies.     Marland Kitchen enoxaparin (LOVENOX) 80 MG/0.8ML injection Inject 0.8 mLs (80 mg total) into the skin every 12 (twelve) hours. 51 mL 1  . EPINEPHrine (EPIPEN 2-PAK) 0.3 mg/0.3 mL IJ SOAJ injection Inject 0.3 mg into the muscle once as needed (for severe allergic reaction).    Marland Kitchen escitalopram (LEXAPRO) 20 MG tablet Take 20 mg by mouth daily after breakfast.     . furosemide (LASIX) 20 MG tablet Take 20 mg by mouth daily.     Marland Kitchen HYDROcodone-acetaminophen (NORCO) 5-325 MG tablet Take 1 tablet by mouth every 4 (four) hours as needed for moderate pain. 15 tablet 0  . ketorolac (ACULAR) 0.5 % ophthalmic solution Place 1 drop into the left eye 4 (four) times daily.    Marland Kitchen levothyroxine (SYNTHROID, LEVOTHROID) 50 MCG tablet Take 1 tablet (50 mcg total) by mouth daily before breakfast. 30 tablet 0  . LINZESS 72 MCG capsule Take 72 mcg by mouth daily after breakfast.    . losartan (COZAAR) 25 MG tablet Take 25 mg by mouth every morning.  0  . magnesium oxide (MAG-OX) 400 MG tablet Take 400 mg by mouth daily after breakfast.    . meloxicam (MOBIC) 7.5 MG tablet Take 7.5 mg by mouth daily as needed for pain.     Marland Kitchen  mirtazapine (REMERON) 15 MG tablet Take 15 mg by mouth every evening.     . mometasone-formoterol (DULERA) 200-5 MCG/ACT AERO Inhale 2 puffs into the lungs 3 (three) times daily.     . Multiple Vitamin (MULTIVITAMIN WITH MINERALS) TABS tablet Take 1 tablet by mouth daily. ONE-A-DAY MULTIVITAMIN 50+    . nitroGLYCERIN (NITROSTAT) 0.4 MG SL tablet Place 0.4 mg under the tongue every 5 (five) minutes as needed for chest pain.    Marland Kitchen. ofloxacin (OCUFLOX) 0.3 % ophthalmic solution Place 1 drop into the left eye 4 (four) times daily.    Marland Kitchen. omeprazole (PRILOSEC) 40 MG capsule Take 40 mg by mouth daily after breakfast.    . Potassium Chloride CR (MICRO-K) 8 MEQ CPCR capsule CR Take 1 capsule by mouth daily after breakfast.    . tiotropium (SPIRIVA) 18 MCG inhalation capsule Place 18 mcg into inhaler and  inhale daily after breakfast.     . traMADol (ULTRAM) 50 MG tablet Take 1 tablet (50 mg total) by mouth every 6 (six) hours as needed. (Patient taking differently: Take 50 mg by mouth 2 (two) times daily. ) 30 tablet 0  . zolpidem (AMBIEN) 5 MG tablet Take 5 mg by mouth at bedtime.    . senna-docusate (SENOKOT-S) 8.6-50 MG tablet Take 1 tablet by mouth at bedtime as needed for mild constipation. (Patient not taking: Reported on 11/26/2016) 30 tablet 3     . abacavir-dolutegravir-lamiVUDine  1 tablet Oral QPC breakfast  . albuterol  2.5 mg Nebulization TID  . atorvastatin  10 mg Oral QPC breakfast  . calcium-vitamin D  1 tablet Oral QPC breakfast  . carvedilol  12.5 mg Oral BID WC  . enoxaparin (LOVENOX) injection  1 mg/kg Subcutaneous Q12H  . escitalopram  20 mg Oral QPC breakfast  . furosemide  20 mg Oral Daily  . ketorolac  1 drop Left Eye QID  . levothyroxine  50 mcg Oral QAC breakfast  . linaclotide  72 mcg Oral QPC breakfast  . magnesium oxide  400 mg Oral QPC breakfast  . mirtazapine  15 mg Oral QPM  . mometasone-formoterol  2 puff Inhalation BID  . ofloxacin  1 drop Left Eye QID  . pantoprazole  40 mg Oral Daily  . polyethylene glycol  17 g Oral Daily  . potassium chloride  10 mEq Oral Daily  . sodium chloride flush  3 mL Intravenous Q12H  . tiotropium  18 mcg Inhalation QPC breakfast  . zolpidem  5 mg Oral QHS    Infusions: . sodium chloride      Allergies  Allergen Reactions  . Aspirin Anaphylaxis  . Bee Venom Anaphylaxis  . Penicillins Anaphylaxis and Other (See Comments)    Has patient had a PCN reaction causing immediate rash, facial/tongue/throat swelling, SOB or lightheadedness with hypotension: Yes Has patient had a PCN reaction causing severe rash involving mucus membranes or skin necrosis: No Has patient had a PCN reaction that required hospitalization No Has patient had a PCN reaction occurring within the last 10 years: Yes If all of the above answers are  "NO", then may proceed with Cephalosporin use.  . Prednisone Anaphylaxis  . Sulfa Antibiotics Anaphylaxis  . Sulfasalazine Anaphylaxis  . Theophyllines Anaphylaxis  . Theophylline Swelling    Social History   Social History  . Marital status: Widowed    Spouse name: N/A  . Number of children: N/A  . Years of education: N/A   Occupational History  . Not on  file.   Social History Main Topics  . Smoking status: Former Smoker    Quit date: 12/16/2001  . Smokeless tobacco: Never Used  . Alcohol use No  . Drug use: No  . Sexual activity: Not on file   Other Topics Concern  . Not on file   Social History Narrative  . No narrative on file    Family History  Problem Relation Age of Onset  . CAD Unknown   . Asthma Mother   . Cirrhosis Father   . Deep vein thrombosis Neg Hx   . Diabetes Neg Hx     PHYSICAL EXAM: Vitals:   03/13/17 0745 03/13/17 0913  BP: (!) 140/94   Pulse: 85 84  Resp: 18 17  Temp: 98.8 F (37.1 C)      Intake/Output Summary (Last 24 hours) at 03/13/17 1045 Last data filed at 03/13/17 0949  Gross per 24 hour  Intake              480 ml  Output              675 ml  Net             -195 ml    General:  Well appearing. No respiratory difficulty HEENT: normal Neck: supple. no JVD. Carotids 2+ bilat; no bruits. No lymphadenopathy or thryomegaly appreciated. Cor: PMI nondisplaced. Regular rate & rhythm. No rubs, gallops or murmurs. Lungs: clear Abdomen: soft, nontender, nondistended. No hepatosplenomegaly. No bruits or masses. Good bowel sounds. Extremities: no cyanosis, clubbing, rash, edema Neuro: alert & oriented x 3, cranial nerves grossly intact. moves all 4 extremities w/o difficulty. Affect pleasant.  ZOX:WRUEA rhythm with no acute changes  Results for orders placed or performed during the hospital encounter of 03/12/17 (from the past 24 hour(s))  Basic metabolic panel     Status: Abnormal   Collection Time: 03/13/17  3:37 AM  Result  Value Ref Range   Sodium 140 135 - 145 mmol/L   Potassium 3.9 3.5 - 5.1 mmol/L   Chloride 110 101 - 111 mmol/L   CO2 25 22 - 32 mmol/L   Glucose, Bld 119 (H) 65 - 99 mg/dL   BUN 17 6 - 20 mg/dL   Creatinine, Ser 5.40 0.61 - 1.24 mg/dL   Calcium 8.7 (L) 8.9 - 10.3 mg/dL   GFR calc non Af Amer >60 >60 mL/min   GFR calc Af Amer >60 >60 mL/min   Anion gap 5 5 - 15  CBC     Status: Abnormal   Collection Time: 03/13/17  3:37 AM  Result Value Ref Range   WBC 5.4 3.8 - 10.6 K/uL   RBC 3.28 (L) 4.40 - 5.90 MIL/uL   Hemoglobin 11.3 (L) 13.0 - 18.0 g/dL   HCT 98.1 (L) 19.1 - 47.8 %   MCV 100.3 (H) 80.0 - 100.0 fL   MCH 34.3 (H) 26.0 - 34.0 pg   MCHC 34.2 32.0 - 36.0 g/dL   RDW 29.5 (H) 62.1 - 30.8 %   Platelets 269 150 - 440 K/uL   US Venous Img Lower Bilateral  Result Date: 03/12/2017 CLINICAL DATA:  Hospitalized 2 weeks ago for right leg DVT. Assess deep venous thrombosis. Initial encounter. EXAM: BILATERAL LOWER EXTREMITY VENOUS DOPPLER ULTRASOUND TECHNIQUE: Gray-scale sonography with graded compression, as well as color Doppler and duplex ultrasound were performed to evaluate the lower extremity deep venous systems from the level of the common femoral vein and including the common femoral,  femoral, profunda femoral, popliteal and calf veins including the posterior tibial, peroneal and gastrocnemius veins when visible. The superficial great saphenous vein was also interrogated. Spectral Doppler was utilized to evaluate flow at rest and with distal augmentation maneuvers in the common femoral, femoral and popliteal veins. COMPARISON:  Right lower extremity venous Doppler ultrasound performed 02/25/2017 FINDINGS: RIGHT LOWER EXTREMITY Common Femoral Vein: No evidence of thrombus. Normal compressibility, respiratory phasicity and response to augmentation. Saphenofemoral Junction: No evidence of thrombus. Normal compressibility and flow on color Doppler imaging. Profunda Femoral Vein: Nonocclusive  thrombus is noted within the profunda femoral vein, new from the prior study. Femoral Vein: Occlusive thrombus is noted within the femoral vein, similar to the prior study. Popliteal Vein: Occlusive thrombus noted within the popliteal vein. Calf Veins: Nonocclusive thrombus is noted within the posterior tibial vein. The peroneal vein is patent, new from the prior study. Superficial Great Saphenous Vein: No evidence of thrombus. Normal compressibility and flow on color Doppler imaging. Venous Reflux:  None. Other Findings:  None. LEFT LOWER EXTREMITY Common Femoral Vein: No evidence of thrombus. Normal compressibility, respiratory phasicity and response to augmentation. Saphenofemoral Junction: No evidence of thrombus. Normal compressibility and flow on color Doppler imaging. Profunda Femoral Vein: No evidence of thrombus. Normal compressibility and flow on color Doppler imaging. Femoral Vein: No evidence of thrombus. Normal compressibility, respiratory phasicity and response to augmentation. Popliteal Vein: No evidence of thrombus. Normal compressibility, respiratory phasicity and response to augmentation. Calf Veins: No evidence of thrombus. Normal compressibility and flow on color Doppler imaging. Superficial Great Saphenous Vein: No evidence of thrombus. Normal compressibility and flow on color Doppler imaging. Venous Reflux:  None. Other Findings:  None. IMPRESSION: 1. Deep venous thrombosis noted within the right profunda femoral vein, femoral vein, popliteal vein and posterior tibial vein, with occlusion at the mid to distal femoral vein and proximal popliteal vein. This has progressed slightly proximally since the prior study. 2. No evidence of DVT at the left lower extremity. These results were called by telephone at the time of interpretation on 03/12/2017 at 5:40 am to Dr. Merrily BrittleNEIL RIFENBARK, who verbally acknowledged these results. Electronically Signed   By: Roanna RaiderJeffery  Chang M.D.   On: 03/12/2017 05:42      ASSESSMENT AND PLAN: Right leg shows progression of DVT and was started on IV heparin and just had an MRI of the chest. Chest pain is most likely noncardiac but is scheduled for a nuclear stress test Thursday in my office. Patient is currently having some tenderness over the chest and appears to be atypical. Patient is getting heparin for DVT has patient is nonresponsive to therapeutic dose of Coumadin.  Elie Leppo A

## 2017-03-13 NOTE — Progress Notes (Signed)
Received report from charge nurse. Patient is complaining of right sided numbness. Charge nurse notified MD and orders for a CT of the head has been ordered. At this point no additional orders. Went into patients room to assess and he has been transferred to CT.

## 2017-03-14 ENCOUNTER — Inpatient Hospital Stay
Admit: 2017-03-14 | Discharge: 2017-03-14 | Disposition: A | Discharge status: Home / Self Care | Payer: Medicare Other | Attending: Internal Medicine | Admitting: Internal Medicine

## 2017-03-14 ENCOUNTER — Inpatient Hospital Stay: Payer: Medicare Other

## 2017-03-14 DIAGNOSIS — D6851 Activated protein C resistance: Secondary | ICD-10-CM

## 2017-03-14 DIAGNOSIS — I509 Heart failure, unspecified: Secondary | ICD-10-CM

## 2017-03-14 LAB — CBC
HCT: 36.9 % — ABNORMAL LOW (ref 40.0–52.0)
HCT: 36.7 % — ABNORMAL LOW (ref 40.0–52.0)
Hemoglobin: 12.4 g/dL — ABNORMAL LOW (ref 13.0–18.0)
Hemoglobin: 12.1 g/dL — ABNORMAL LOW (ref 13.0–18.0)
MCH: 33.8 pg (ref 26.0–34.0)
MCH: 32.6 pg (ref 26.0–34.0)
MCHC: 33.6 g/dL (ref 32.0–36.0)
MCHC: 32.9 g/dL (ref 32.0–36.0)
MCV: 100.6 fL — ABNORMAL HIGH (ref 80.0–100.0)
MCV: 98.9 fL (ref 80.0–100.0)
PLATELETS: 294 10*3/uL (ref 150–440)
PLATELETS: 347 10*3/uL (ref 150–440)
RBC: 3.67 MIL/uL — AB (ref 4.40–5.90)
RBC: 3.71 MIL/uL — ABNORMAL LOW (ref 4.40–5.90)
RDW: 14.7 % — ABNORMAL HIGH (ref 11.5–14.5)
RDW: 15.2 % — AB (ref 11.5–14.5)
WBC: 9.3 10*3/uL (ref 3.8–10.6)
WBC: 14.9 10*3/uL — AB (ref 3.8–10.6)

## 2017-03-14 LAB — APTT
APTT: 74 s — AB (ref 24–36)
aPTT: 103 seconds — ABNORMAL HIGH (ref 24–36)
aPTT: 102 seconds — ABNORMAL HIGH (ref 24–36)

## 2017-03-14 LAB — HEPARIN LEVEL (UNFRACTIONATED)
HEPARIN UNFRACTIONATED: 2.28 [IU]/mL — AB (ref 0.30–0.70)
HEPARIN UNFRACTIONATED: 2.48 [IU]/mL — AB (ref 0.30–0.70)
HEPARIN UNFRACTIONATED: 1.71 [IU]/mL — AB (ref 0.30–0.70)

## 2017-03-14 MED ORDER — METHYLPREDNISOLONE SODIUM SUCC 40 MG IJ SOLR
40.0000 mg | Freq: Two times a day (BID) | INTRAMUSCULAR | Status: DC
  Administered 2017-03-14: 40 mg via INTRAVENOUS
  Filled 2017-03-14 (×2): qty 1

## 2017-03-14 MED ORDER — FONDAPARINUX SODIUM 7.5 MG/0.6ML ~~LOC~~ SOLN
7.5000 mg | SUBCUTANEOUS | Status: DC
  Administered 2017-03-14 – 2017-03-15 (×2): 7.5 mg via SUBCUTANEOUS
  Filled 2017-03-14 (×2): qty 0.6

## 2017-03-14 MED ORDER — LOSARTAN POTASSIUM 25 MG PO TABS
25.0000 mg | ORAL_TABLET | Freq: Every day | ORAL | Status: DC
  Administered 2017-03-14 – 2017-03-15 (×2): 25 mg via ORAL
  Filled 2017-03-14: qty 1

## 2017-03-14 MED ORDER — HEPARIN (PORCINE) IN NACL 100-0.45 UNIT/ML-% IJ SOLN
1250.0000 [IU]/h | INTRAMUSCULAR | Status: AC
  Filled 2017-03-14: qty 250

## 2017-03-14 MED ORDER — ALBUTEROL SULFATE (2.5 MG/3ML) 0.083% IN NEBU
2.5000 mg | INHALATION_SOLUTION | Freq: Three times a day (TID) | RESPIRATORY_TRACT | Status: DC
  Administered 2017-03-14 – 2017-03-15 (×4): 2.5 mg via RESPIRATORY_TRACT
  Filled 2017-03-14 (×4): qty 3

## 2017-03-14 MED ORDER — ALBUTEROL SULFATE (2.5 MG/3ML) 0.083% IN NEBU
2.5000 mg | INHALATION_SOLUTION | Freq: Four times a day (QID) | RESPIRATORY_TRACT | Status: DC | PRN
Start: 2017-03-14 — End: 2017-03-15

## 2017-03-14 MED ORDER — FUROSEMIDE 10 MG/ML IJ SOLN
20.0000 mg | Freq: Every day | INTRAMUSCULAR | Status: DC
  Administered 2017-03-14 – 2017-03-15 (×2): 20 mg via INTRAVENOUS
  Filled 2017-03-14 (×2): qty 4

## 2017-03-14 NOTE — Progress Notes (Signed)
PT Attempt Note  Patient Details Name: Robina Adeommy L Tedrick MRN: [id number] DOB: [birth year]   Attempt Treatment:    Reason Eval/Treat Not Completed: Patient at procedure or test/unavailable. Chart reviewed. Attempted to see patient however he is out of room for testing. Will attempt on later date/time as pt is available and appropriate.  Sharalyn InkJason D Leroy Trim PT, DPT   Airika Alkhatib 03/14/2017, 2:34 PM

## 2017-03-14 NOTE — Progress Notes (Signed)
[facility]   PATIENT NAME: Brian Hampton    MR#:  [id number]  DATE OF BIRTH:  1956/05/29  SUBJECTIVE:  CHIEF COMPLAINT:  Patient is Still wheezing, right lower extending the weakness from yesterday, CT head is negative  REVIEW OF SYSTEMS:  CONSTITUTIONAL: No fever, fatigue or weakness.  EYES: No blurred or double vision.  EARS, NOSE, AND THROAT: No tinnitus or ear pain.  RESPIRATORY: Reports cough, shortness of breath, wheezing , denies hemoptysis.  CARDIOVASCULAR: No chest pain, orthopnea, edema.  GASTROINTESTINAL: No nausea, vomiting, diarrhea or abdominal pain.  GENITOURINARY: No dysuria, hematuria.  ENDOCRINE: No polyuria, nocturia,  HEMATOLOGY: No anemia, easy bruising or bleeding SKIN: No rash or lesion. MUSCULOSKELETAL: Right leg pain No joint pain or arthritis.   NEUROLOGIC: No tingling, numbness, weakness.  PSYCHIATRY: No anxiety or depression.   DRUG ALLERGIES:   Allergies  Allergen Reactions  . Aspirin Anaphylaxis  . Bee Venom Anaphylaxis  . Penicillins Anaphylaxis and Other (See Comments)    Has patient had a PCN reaction causing immediate rash, facial/tongue/throat swelling, SOB or lightheadedness with hypotension: Yes Has patient had a PCN reaction causing severe rash involving mucus membranes or skin necrosis: No Has patient had a PCN reaction that required hospitalization No Has patient had a PCN reaction occurring within the last 10 years: Yes If all of the above answers are "NO", then may proceed with Cephalosporin use.  . Prednisone Anaphylaxis  . Sulfa Antibiotics Anaphylaxis  . Sulfasalazine Anaphylaxis  . Theophyllines Anaphylaxis  . Theophylline Swelling    VITALS:  Blood pressure (!) 151/81, pulse 89, temperature 99.1 F (37.3 C), temperature source Oral, resp. rate 16, height 5\' 5"  (1.651 m), weight 87.1 kg (192 lb), SpO2 99 %.  PHYSICAL EXAMINATION:  GENERAL:  61 y.o.-year-old patient lying in  the bed with no acute distress.  EYES: Pupils equal, round, reactive to light and accommodation. No scleral icterus. Extraocular muscles intact.  HEENT: Head atraumatic, normocephalic. Oropharynx and nasopharynx clear.  NECK:  Supple, no jugular venous distention. No thyroid enlargement, no tenderness.  LUNGS: mod breath sounds bilaterally, min. Wheezing, no rales,rhonchi or crepitation. No use of accessory muscles of respiration.  CARDIOVASCULAR: S1, S2 normal. No murmurs, rubs, or gallops.  ABDOMEN: Soft, nontender, nondistended. Bowel sounds present. No organomegaly or mass.  EXTREMITIES: Right calf tenderness is present , right lower extremity is edematous No pedal edema, cyanosis, or clubbing.  NEUROLOGIC: Cranial nerves II through XII are intact. Muscle strength 5/5 in all extremities  Except right lower extremity 3 out of 5Sensation intact. Gait not checked.  PSYCHIATRIC: The patient is alert and oriented x 3.  SKIN: No obvious rash, lesion, or ulcer.    LABORATORY PANEL:   CBC  Recent Labs Lab 03/14/17 0446  WBC 9.3  HGB 12.4*  HCT 36.9*  PLT 294   ------------------------------------------------------------------------------------------------------------------  Chemistries   Recent Labs Lab 03/13/17 0337 03/13/17 2059  NA 140  --   K 3.9  --   CL 110  --   CO2 25  --   GLUCOSE 119*  --   BUN 17  --   CREATININE 1.16  --   CALCIUM 8.7*  --   MG  --  1.8   ------------------------------------------------------------------------------------------------------------------  Cardiac Enzymes  Recent Labs Lab 03/12/17 0321  TROPONINI <0.03   ------------------------------------------------------------------------------------------------------------------  RADIOLOGY:  Ct Angio Head W Or Wo Contrast  Result Date: 03/13/2017 CLINICAL DATA:  61 y/o  M; 1  hour of right-sided numbness. EXAM: CT ANGIOGRAPHY HEAD TECHNIQUE: Multidetector CT imaging of the head was  performed using the standard protocol during bolus administration of intravenous contrast. Multiplanar CT image reconstructions and MIPs were obtained to evaluate the vascular anatomy. CONTRAST:  75 cc Isovue 370 COMPARISON:  01/30/2016 MRI of the head.  03/13/2017 CT of the head. FINDINGS: CTA HEAD Anterior circulation: No significant stenosis, proximal occlusion, aneurysm, or vascular malformation. Posterior circulation: No significant stenosis, proximal occlusion, aneurysm, or vascular malformation. Venous sinuses: As permitted by contrast timing, patent. Anatomic variants: Bilateral fetal PCA. Small caliber vertebrobasilar system with similar small flow voids on the prior MRI of the brain, likely anatomic variant due to bilateral fetal PCA. Delayed phase: No abnormal intracranial enhancement. IMPRESSION: Patent circle of Willis. No large vessel occlusion, aneurysm, or significant stenosis. Variant anatomy with bilateral fetal PCA and small caliber vertebrobasilar system. Electronically Signed   By: [name] M.D.   On: 03/13/2017 16:38   Dg Chest 2 View  Result Date: 03/13/2017 CLINICAL DATA:  Former smoker hx of COPD, asthma, CAD admitted with multiple blood clots (per patient). EXAM: CHEST  2 VIEW COMPARISON:  Chest x-ray dated 11/26/2016. Chest CT dated 03/03/2017. FINDINGS: Heart size and mediastinal contours are within normal limits. Lungs appear clear. No pleural effusion or pneumothorax seen. Stable mild compression deformity of a mid thoracic vertebral body. No acute or suspicious osseous finding seen. IVC filter noted in the upper abdomen. IMPRESSION: No active cardiopulmonary disease. No evidence of pneumonia or pulmonary edema. Electronically Signed   By: Bary Richard M.D.   On: 03/13/2017 11:27   Ct Head Wo Contrast  Result Date: 03/13/2017 CLINICAL DATA:  Right sided numbness x 1 hour ago EXAM: CT HEAD WITHOUT CONTRAST TECHNIQUE: Contiguous axial images were obtained from the  base of the skull through the vertex without intravenous contrast. COMPARISON:  02/01/2016 FINDINGS: Brain: There is mild central and cortical atrophy. Mild periventricular white matter changes are consistent with small vessel disease. There is no intra or extra-axial fluid collection or mass lesion. The basilar cisterns and ventricles have a normal appearance. There is no CT evidence for acute infarction or hemorrhage. Vascular: No hyperdense vessel or unexpected calcification. Skull: No acute abnormality. Sinuses/Orbits: No acute finding. Other: None IMPRESSION: 1.  No evidence for acute  abnormality. 2. Mild atrophy and small vessel disease. Electronically Signed   By: Norva Pavlov M.D.   On: 03/13/2017 16:03   US Abdomen Complete  Result Date: 03/13/2017 CLINICAL DATA:  Nonspecific abdominal pain beginning at 1 p.m. EXAM: ABDOMEN ULTRASOUND COMPLETE COMPARISON:  CT abdomen and pelvis May 05, 2017 FINDINGS: Limited examination due to body habitus and, bowel gas. Gallbladder: Contracted resulting in gallbladder wall thickening. No cholelithiasis. No sonographic Murphy sign noted by sonographer. Common bile duct: Diameter: 3 mm. Liver: No focal lesion identified. Within normal limits in parenchymal echogenicity. Hepatopetal portal vein. IVC: No abnormality visualized. Pancreas: Predominately obscured. Spleen: Size and appearance within normal limits. Right Kidney: Length: 9.7 cm. Echogenicity within normal limits. No mass or hydronephrosis visualized. Left Kidney: Length: 10.5 cm. Echogenicity within normal limits. No mass or hydronephrosis visualized. Abdominal aorta: No aneurysm visualized.  Distal aorta obscured. Other findings: None. IMPRESSION: No acute abdominal process. Electronically Signed   By: Awilda Metro M.D.   On: 03/13/2017 20:32   US Carotid Bilateral  Result Date: 03/14/2017 CLINICAL DATA:  61 year old male with symptoms of transient ischemic attack EXAM: BILATERAL CAROTID  DUPLEX ULTRASOUND TECHNIQUE: Wallace Cullens  scale imaging, color Doppler and duplex ultrasound were performed of bilateral carotid and vertebral arteries in the neck. COMPARISON:  None. FINDINGS: Criteria: Quantification of carotid stenosis is based on velocity parameters that correlate the residual internal carotid diameter with NASCET-based stenosis levels, using the diameter of the distal internal carotid lumen as the denominator for stenosis measurement. The following velocity measurements were obtained: RIGHT ICA:  101/24 cm/sec CCA:  100/15 cm/sec SYSTOLIC ICA/CCA RATIO:  1.0 DIASTOLIC ICA/CCA RATIO:  1.6 ECA:  131 cm/sec LEFT ICA:  69/19 cm/sec CCA:  126/23 cm/sec SYSTOLIC ICA/CCA RATIO:  0.6 DIASTOLIC ICA/CCA RATIO:  0.8 ECA:  100 cm/sec RIGHT CAROTID ARTERY: Smooth heterogeneous atherosclerotic plaque in the proximal internal carotid artery. By peak systolic velocity criteria, the estimated stenosis remains less than 50%. RIGHT VERTEBRAL ARTERY:  Patent with antegrade flow. LEFT CAROTID ARTERY: No focal atherosclerotic plaque or evidence of stenosis. LEFT VERTEBRAL ARTERY:  Patent with normal antegrade flow. IMPRESSION: 1. Mild (1-49%) stenosis proximal right internal carotid artery secondary to smooth heterogeneous atherosclerotic plaque. 2. No significant atherosclerotic plaque or evidence of stenosis in the left internal carotid artery. 3. Vertebral arteries are patent with normal antegrade flow. Signed, Sterling Big, MD [facility] Electronically Signed   By: Malachy Moan M.D.   On: 03/14/2017 15:08    EKG:   Orders placed or performed during the hospital encounter of 03/12/17  . ED EKG within 10 minutes  . ED EKG within 10 minutes    ASSESSMENT AND PLAN:    61 year old male patient with history of pulmonary embolism, recurrent DVT lower extremity, COPD, bronchial asthma, coronary artery disease presented to the emergency room  with right lower extremity pain and swelling.  1. 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 B  2. Right lower extremity weakness rule out CVA  CT head and CT angiogram of the head are negative  Carotid Dopplers with minimal stenosis  2-D echocardiogram  Neuro checks  MRI of the brain  Neurology consulted , appreciate their recommendations  Serum uric acid level is normal  3. AECOPD IV Solu-Medrol and nebulizer treatments Chest x-ray negative  4.  chest pain with a history of Coronary artery disease Chest pain is most likely noncardiac from cardiology standpoint and is scheduled for nuclear stress test on Thursday at Dr. Milta Deiters office Chest x-ray negative.  PPI twice a day Continue Coreg, Lipitor and home medication Lasix   5. Hypokalemia replete as needed and recheck       All the records are reviewed and case discussed with Care Management/Social Workerr. Management plans discussed with the patient, family and they are in agreement.  CODE STATUS: fc  TOTAL TIME TAKING CARE OF THIS PATIENT: .   POSSIBLE D/C IN 2 DAYS, DEPENDING ON CLINICAL CONDITION.  Note: This dictation was prepared with Dragon dictation along with smaller phrase technology. Any transcriptional errors that result from this process are unintentional.   Ramonita Lab M.D on 03/14/2017 at 4:13 PM  Between 7am to 6pm - Pager - [phone] After 6pm go to [email or web address] - Scientist, research (life sciences) Hospitalists  Office  [phone]  CC: Primary care physician; Mick Sell, MD

## 2017-03-14 NOTE — Consult Note (Signed)
Reason for Consult: R sided weakness  Referring Physician: Dr. Amado CoeGouru  CC: R side weakness   HPI: Brian Hampton is an 61 y.o. male  with past medical history significant for history of asthma, collagen vascular disease, COPD, coronary artery disease, recurrent DVTs, hypertension, HIV, pulmonary embolism, who is on Lovenox , who presents to the hospital with right lower extremity swelling and pain. Doppler ultrasound revealed progression of DVT in the right lower extremity. Neurology consulted for increased weakness on RUE and RLE since yesterday. States not associated with pain. [facility]   Past Medical History:  Diagnosis Date  . Anemia   . Asthma   . Cataracts, bilateral    worse in Rt eye  . Collagen vascular disease (HCC)   . COPD (chronic obstructive pulmonary disease) (HCC)   . Coronary artery disease   . Depression   . DVT (deep venous thrombosis) (HCC)   . Dysrhythmia   . Emphysema/COPD (HCC)   . Environmental and seasonal allergies   . H/O blood clots   . Heart murmur   . HIV (human immunodeficiency virus infection) (HCC)   . Hypertension   . Leaky heart valve    x 3  . Lung mass   . Myocardial infarction (HCC)    in 2000  . Pneumonia    year ago  . Pulmonary emboli (HCC)   . Type 2 diabetes mellitus (HCC)     Past Surgical History:  Procedure Laterality Date  . ANKLE ARTHROSCOPY    . IVC FILTER INSERTION    . KYPHOPLASTY N/A 12/21/2016   Procedure: KYPHOPLASTY;  Surgeon: Kennedy BuckerMenz, Michael, MD;  Location: [facility] ORS;  Service: Orthopedics;  Laterality: N/A;  . PERIPHERAL VASCULAR CATHETERIZATION N/A 03/16/2016   Procedure: IVC Filter Insertion;  Surgeon: Renford DillsGregory G Schnier, MD;  Location: [facility] INVASIVE CV LAB;  Service: Cardiovascular;  Laterality: N/A;  . SINUS EXPLORATION    . WRIST ARTHROSCOPY Right     Family History  Problem Relation Age of Onset  . CAD Unknown   . Asthma Mother   . Cirrhosis Father   . Deep vein thrombosis Neg Hx   .  Diabetes Neg Hx     Social History:  reports that he quit smoking about 15 years ago. He has never used smokeless tobacco. He reports that he does not drink alcohol or use drugs.  Allergies  Allergen Reactions  . Aspirin Anaphylaxis  . Bee Venom Anaphylaxis  . Penicillins Anaphylaxis and Other (See Comments)    Has patient had a PCN reaction causing immediate rash, facial/tongue/throat swelling, SOB or lightheadedness with hypotension: Yes Has patient had a PCN reaction causing severe rash involving mucus membranes or skin necrosis: No Has patient had a PCN reaction that required hospitalization No Has patient had a PCN reaction occurring within the last 10 years: Yes If all of the above answers are "NO", then may proceed with Cephalosporin use.  . Prednisone Anaphylaxis  . Sulfa Antibiotics Anaphylaxis  . Sulfasalazine Anaphylaxis  . Theophyllines Anaphylaxis  . Theophylline Swelling    Medications: I have reviewed the patient's current medications.  ROS: History obtained from the patient  General ROS: negative for - chills, fatigue, fever, night sweats, weight gain or weight loss Psychological ROS: negative for - behavioral disorder, hallucinations, memory difficulties, mood swings or suicidal ideation Ophthalmic ROS: negative for - blurry vision, double vision, eye pain or loss of vision ENT ROS: negative for - epistaxis, nasal discharge, oral  lesions, sore throat, tinnitus or vertigo Allergy and Immunology ROS: negative for - hives or itchy/watery eyes Hematological and Lymphatic ROS:  Hx of multiple PEs Endocrine ROS: negative for - galactorrhea, hair pattern changes, polydipsia/polyuria or temperature intolerance Respiratory ROS: negative for - cough, hemoptysis, shortness of breath or wheezing Cardiovascular ROS: negative for - chest pain, dyspnea on exertion, edema or irregular heartbeat Gastrointestinal ROS: negative for - abdominal pain, diarrhea, hematemesis,  nausea/vomiting or stool incontinence Genito-Urinary ROS: negative for - dysuria, hematuria, incontinence or urinary frequency/urgency Musculoskeletal ROS: negative for - joint swelling or muscular weakness Neurological ROS: as noted in HPI Dermatological ROS: negative for rash and skin lesion changes  Physical Examination: Blood pressure (!) 151/81, pulse 89, temperature 99.1 F (37.3 C), temperature source Oral, resp. rate 16, height 5\' 5"  (1.651 m), weight 87.1 kg (192 lb), SpO2 99 %.   Neurological Examination   Mental Status: Alert, oriented to place and time  Cranial Nerves: II: Discs flat bilaterally; Visual fields grossly normal, pupils equal, round, reactive to light and accommodation III,IV, VI: ptosis not present, extra-ocular motions intact bilaterally V,VII: smile symmetric, facial light touch sensation normal bilaterally VIII: hearing normal bilaterally IX,X: gag reflex present XI: bilateral shoulder shrug XII: midline tongue extension Motor: Right : Upper extremity   3/5    Left:     Upper extremity   4+/5  Lower extremity   3/5     Lower extremity   4+/5 Tone and bulk:normal tone throughout; no atrophy noted Sensory: Pinprick and light touch intact throughout, bilaterally Deep Tendon Reflexes: 2+ and symmetric throughout Plantars: Right: downgoing   Left: downgoing Cerebellar: Not tested Gait: not tested      Laboratory Studies:   Basic Metabolic Panel:  Recent Labs Lab 03/12/17 0321 03/13/17 0337 03/13/17 2059  NA 140 140  --   K 3.4* 3.9  --   CL 110 110  --   CO2 24 25  --   GLUCOSE 91 119*  --   BUN 15 17  --   CREATININE 1.16 1.16  --   CALCIUM 8.7* 8.7*  --   MG  --   --  1.8    Liver Function Tests: No results for input(s): AST, ALT, ALKPHOS, BILITOT, PROT, ALBUMIN in the last 168 hours. No results for input(s): LIPASE, AMYLASE in the last 168 hours. No results for input(s): AMMONIA in the last 168 hours.  CBC:  Recent Labs Lab  03/12/17 0321 03/13/17 0337 03/14/17 0446  WBC 6.7 5.4 9.3  HGB 12.0* 11.3* 12.4*  HCT 35.3* 32.9* 36.9*  MCV 99.5 100.3* 100.6*  PLT 308 269 294    Cardiac Enzymes:  Recent Labs Lab 03/12/17 0321  TROPONINI <0.03    BNP: Invalid input(s): POCBNP  CBG: No results for input(s): GLUCAP in the last 168 hours.  Microbiology: Results for orders placed or performed during the hospital encounter of 12/21/16  Gastrointestinal Panel by PCR , Stool     Status: None   Collection Time: 12/22/16  3:41 PM  Result Value Ref Range Status   Campylobacter species NOT DETECTED NOT DETECTED Final   Plesimonas shigelloides NOT DETECTED NOT DETECTED Final   Salmonella species NOT DETECTED NOT DETECTED Final   Yersinia enterocolitica NOT DETECTED NOT DETECTED Final   Vibrio species NOT DETECTED NOT DETECTED Final   Vibrio cholerae NOT DETECTED NOT DETECTED Final   Enteroaggregative E coli (EAEC) NOT DETECTED NOT DETECTED Final   Enteropathogenic E coli (EPEC) NOT  DETECTED NOT DETECTED Final   Enterotoxigenic E coli (ETEC) NOT DETECTED NOT DETECTED Final   Shiga like toxin producing E coli (STEC) NOT DETECTED NOT DETECTED Final   Shigella/Enteroinvasive E coli (EIEC) NOT DETECTED NOT DETECTED Final   Cryptosporidium NOT DETECTED NOT DETECTED Final   Cyclospora cayetanensis NOT DETECTED NOT DETECTED Final   Entamoeba histolytica NOT DETECTED NOT DETECTED Final   Giardia lamblia NOT DETECTED NOT DETECTED Final   Adenovirus F40/41 NOT DETECTED NOT DETECTED Final   Astrovirus NOT DETECTED NOT DETECTED Final   Norovirus GI/GII NOT DETECTED NOT DETECTED Final   Rotavirus A NOT DETECTED NOT DETECTED Final   Sapovirus (I, II, IV, and V) NOT DETECTED NOT DETECTED Final  C difficile quick scan w PCR reflex     Status: None   Collection Time: 12/22/16  3:41 PM  Result Value Ref Range Status   C Diff antigen NEGATIVE NEGATIVE Final   C Diff toxin NEGATIVE NEGATIVE Final   C Diff interpretation No  C. difficile detected.  Final    Coagulation Studies:  Recent Labs  03/12/17 0321  LABPROT 13.0  INR 0.98    Urinalysis: No results for input(s): COLORURINE, LABSPEC, PHURINE, GLUCOSEU, HGBUR, BILIRUBINUR, KETONESUR, PROTEINUR, UROBILINOGEN, NITRITE, LEUKOCYTESUR in the last 168 hours.  Invalid input(s): APPERANCEUR  Lipid Panel:     Component Value Date/Time   CHOL 163 12/22/2016 0504   TRIG 190 (H) 12/22/2016 0504   HDL 40 (L) 12/22/2016 0504   CHOLHDL 4.1 12/22/2016 0504   VLDL 38 12/22/2016 0504   LDLCALC 85 12/22/2016 0504    HgbA1C:  Lab Results  Component Value Date   HGBA1C 5.9 (H) 03/04/2017    Urine Drug Screen:  No results found for: LABOPIA, COCAINSCRNUR, LABBENZ, AMPHETMU, THCU, LABBARB  Alcohol Level: No results for input(s): ETH in the last 168 hours.  Other results:  Imaging: Ct Angio Head W Or Wo Contrast  Result Date: 03/13/2017 CLINICAL DATA:  61 y/o  M; 1 hour of right-sided numbness. EXAM: CT ANGIOGRAPHY HEAD TECHNIQUE: Multidetector CT imaging of the head was performed using the standard protocol during bolus administration of intravenous contrast. Multiplanar CT image reconstructions and MIPs were obtained to evaluate the vascular anatomy. CONTRAST:  75 cc Isovue 370 COMPARISON:  01/30/2016 MRI of the head.  03/13/2017 CT of the head. FINDINGS: CTA HEAD Anterior circulation: No significant stenosis, proximal occlusion, aneurysm, or vascular malformation. Posterior circulation: No significant stenosis, proximal occlusion, aneurysm, or vascular malformation. Venous sinuses: As permitted by contrast timing, patent. Anatomic variants: Bilateral fetal PCA. Small caliber vertebrobasilar system with similar small flow voids on the prior MRI of the brain, likely anatomic variant due to bilateral fetal PCA. Delayed phase: No abnormal intracranial enhancement. IMPRESSION: Patent circle of Willis. No large vessel occlusion, aneurysm, or significant stenosis.  Variant anatomy with bilateral fetal PCA and small caliber vertebrobasilar system. Electronically Signed   By: [name] M.D.   On: 03/13/2017 16:38   Dg Chest 2 View  Result Date: 03/13/2017 CLINICAL DATA:  Former smoker hx of COPD, asthma, CAD admitted with multiple blood clots (per patient). EXAM: CHEST  2 VIEW COMPARISON:  Chest x-ray dated 11/26/2016. Chest CT dated 03/03/2017. FINDINGS: Heart size and mediastinal contours are within normal limits. Lungs appear clear. No pleural effusion or pneumothorax seen. Stable mild compression deformity of a mid thoracic vertebral body. No acute or suspicious osseous finding seen. IVC filter noted in the upper abdomen. IMPRESSION: No active cardiopulmonary disease. No  evidence of pneumonia or pulmonary edema. Electronically Signed   By: Bary Richard M.D.   On: 03/13/2017 11:27   Ct Head Wo Contrast  Result Date: 03/13/2017 CLINICAL DATA:  Right sided numbness x 1 hour ago EXAM: CT HEAD WITHOUT CONTRAST TECHNIQUE: Contiguous axial images were obtained from the base of the skull through the vertex without intravenous contrast. COMPARISON:  02/01/2016 FINDINGS: Brain: There is mild central and cortical atrophy. Mild periventricular white matter changes are consistent with small vessel disease. There is no intra or extra-axial fluid collection or mass lesion. The basilar cisterns and ventricles have a normal appearance. There is no CT evidence for acute infarction or hemorrhage. Vascular: No hyperdense vessel or unexpected calcification. Skull: No acute abnormality. Sinuses/Orbits: No acute finding. Other: None IMPRESSION: 1.  No evidence for acute  abnormality. 2. Mild atrophy and small vessel disease. Electronically Signed   By: Norva Pavlov M.D.   On: 03/13/2017 16:03   US Abdomen Complete  Result Date: 03/13/2017 CLINICAL DATA:  Nonspecific abdominal pain beginning at 1 p.m. EXAM: ABDOMEN ULTRASOUND COMPLETE COMPARISON:  CT abdomen and pelvis  May 05, 2017 FINDINGS: Limited examination due to body habitus and, bowel gas. Gallbladder: Contracted resulting in gallbladder wall thickening. No cholelithiasis. No sonographic Murphy sign noted by sonographer. Common bile duct: Diameter: 3 mm. Liver: No focal lesion identified. Within normal limits in parenchymal echogenicity. Hepatopetal portal vein. IVC: No abnormality visualized. Pancreas: Predominately obscured. Spleen: Size and appearance within normal limits. Right Kidney: Length: 9.7 cm. Echogenicity within normal limits. No mass or hydronephrosis visualized. Left Kidney: Length: 10.5 cm. Echogenicity within normal limits. No mass or hydronephrosis visualized. Abdominal aorta: No aneurysm visualized.  Distal aorta obscured. Other findings: None. IMPRESSION: No acute abdominal process. Electronically Signed   By: Awilda Metro M.D.   On: 03/13/2017 20:32     Assessment/Plan:  61 y.o. male  with past medical history significant for history of asthma, collagen vascular disease, COPD, coronary artery disease, recurrent DVTs, hypertension, HIV, pulmonary embolism, who is on Lovenox , who presents to the hospital with right lower extremity swelling and pain. Doppler ultrasound revealed progression of DVT in the right lower extremity. Neurology consulted for increased weakness on RUE and RLE since yesterday. States not associated with pain. Prior CTH and CTA no abnormalities   - Unclear to me if this is progression of R sided weakness or is new - Pt is already on anticoagulation currently on Heparin gtt - He is a vascularpath and has risk for stroke - MRI brain ordered to make sure no ischemia - Con't anticoagulation at this time. Pauletta Browns  03/14/2017, 10:58 AM

## 2017-03-14 NOTE — Progress Notes (Signed)
SUBJECTIVE: Was wheezing and has occasional crepitations.   Vitals:   03/13/17 1632 03/13/17 2028 03/13/17 2356 03/14/17 0838  BP: (!) 145/84 134/79  (!) 151/81  Pulse: 85 84  89  Resp: 18 19  16   Temp:  98.9 F (37.2 C)  99.1 F (37.3 C)  TempSrc:  Oral  Oral  SpO2: 98% 96% 96% 99%  Weight:      Height:        Intake/Output Summary (Last 24 hours) at 03/14/17 1249 Last data filed at 03/14/17 0948  Gross per 24 hour  Intake           497.67 ml  Output             1300 ml  Net          -802.33 ml    LABS: Basic Metabolic Panel:  Recent Labs  16/05/9607/04/18 0321 03/13/17 0337 03/13/17 2059  NA 140 140  --   K 3.4* 3.9  --   CL 110 110  --   CO2 24 25  --   GLUCOSE 91 119*  --   BUN 15 17  --   CREATININE 1.16 1.16  --   CALCIUM 8.7* 8.7*  --   MG  --   --  1.8   Liver Function Tests: No results for input(s): AST, ALT, ALKPHOS, BILITOT, PROT, ALBUMIN in the last 72 hours. No results for input(s): LIPASE, AMYLASE in the last 72 hours. CBC:  Recent Labs  03/13/17 0337 03/14/17 0446  WBC 5.4 9.3  HGB 11.3* 12.4*  HCT 32.9* 36.9*  MCV 100.3* 100.6*  PLT 269 294   Cardiac Enzymes:  Recent Labs  03/12/17 0321  TROPONINI <0.03   BNP: Invalid input(s): POCBNP D-Dimer: No results for input(s): DDIMER in the last 72 hours. Hemoglobin A1C: No results for input(s): HGBA1C in the last 72 hours. Fasting Lipid Panel: No results for input(s): CHOL, HDL, LDLCALC, TRIG, CHOLHDL, LDLDIRECT in the last 72 hours. Thyroid Function Tests: No results for input(s): TSH, T4TOTAL, T3FREE, THYROIDAB in the last 72 hours.  Invalid input(s): FREET3 Anemia Panel: No results for input(s): VITAMINB12, FOLATE, FERRITIN, TIBC, IRON, RETICCTPCT in the last 72 hours.   PHYSICAL EXAM General: Well developed, well nourished, in no acute distress HEENT:  Normocephalic and atramatic Neck:  No JVD.  Lungs: Clear bilaterally to auscultation and percussion. Heart: HRRR . Normal S1  and S2 without gallops or murmurs.  Abdomen: Bowel sounds are positive, abdomen soft and non-tender  Msk:  Back normal, normal gait. Normal strength and tone for age. Extremities: No clubbing, cyanosis or edema.   Neuro: Alert and oriented X 3. Psych:  Good affect, responds appropriately  TELEMETRY: NSR  ASSESSMENT AND PLAN: CHF, due to diastolic dysfunction on echo at office. LVEF 76%, mild MR/TR/PR, and mild diastolic dysfunction. Agree with lasix 20 once Hampton day.  Active Problems:   DVT (deep vein thrombosis) in pregnancy (HCC)    Laurier NancyKHAN,Brian Hammock A, MD, [facility] 03/14/2017 12:49 PM

## 2017-03-14 NOTE — Progress Notes (Signed)
*  PRELIMINARY RESULTS* Echocardiogram 2D Echocardiogram has been performed.  Cristela BlueHege, Kregg Cihlar 03/14/2017, 1:41 PM

## 2017-03-14 NOTE — Care Management Note (Signed)
Case Management Note  Patient Details  Name: Brian Hampton MRN: [id number] Date of Birth: 1955/09/29  Subjective/Objective:  Attending requested I call in Arixtra 7.5 mg SQ once daily and get out of pocket cost for patient. [facility] [address] [facility] , no out of pocket cost per patient. MD updated.                   Action/Plan:   Expected Discharge Date:                  Expected Discharge Plan:     In-House Referral:     Discharge planning Services  CM Consult  Post Acute Care Choice:    Choice offered to:     DME Arranged:    DME Agency:     HH Arranged:    HH Agency:     Status of Service:  Completed, signed off  If discussed at [employer] Length of Stay Meetings, dates discussed:    Additional Comments:  Marily MemosLisa M Litha Lamartina, RN 03/14/2017, 11:40 AM

## 2017-03-14 NOTE — Progress Notes (Addendum)
ANTICOAGULATION CONSULT NOTE - Initial Consult  Pharmacy Consult for Heparin drip Indication: DVT  Allergies  Allergen Reactions  . Aspirin Anaphylaxis  . Bee Venom Anaphylaxis  . Penicillins Anaphylaxis and Other (See Comments)    Has patient had a PCN reaction causing immediate rash, facial/tongue/throat swelling, SOB or lightheadedness with hypotension: Yes Has patient had a PCN reaction causing severe rash involving mucus membranes or skin necrosis: No Has patient had a PCN reaction that required hospitalization No Has patient had a PCN reaction occurring within the last 10 years: Yes If all of the above answers are "NO", then may proceed with Cephalosporin use.  . Prednisone Anaphylaxis  . Sulfa Antibiotics Anaphylaxis  . Sulfasalazine Anaphylaxis  . Theophyllines Anaphylaxis  . Theophylline Swelling    Patient Measurements: Height: 5\' 5"  (165.1 cm) Weight: 192 lb (87.1 kg) IBW/kg (Calculated) : 61.5 Heparin Dosing Weight: 79.9 kg  Vital Signs: Temp: 98.9 F (37.2 C) (08/05 2028) Temp Source: Oral (08/05 2028) BP: 134/79 (08/05 2028) Pulse Rate: 84 (08/05 2028)  Labs:  Recent Labs  03/12/17 0321 03/12/17 1610 03/13/17 0337 03/13/17 1421 03/13/17 2059 03/14/17 0446  HGB 12.0*  --  11.3*  --   --  12.4*  HCT 35.3*  --  32.9*  --   --  36.9*  PLT 308  --  269  --   --  294  APTT 33  --   --   --  33 103*  LABPROT 13.0  --   --   --   --   --   INR 0.98  --   --   --   --   --   HEPARINUNFRC  --  2.12*  --   --  1.66*  --   HEPRLOWMOCWT  --   --   --  2.00  --   --   CREATININE 1.16  --  1.16  --   --   --   TROPONINI <0.03  --   --   --   --   --     Estimated Creatinine Clearance: 69.5 mL/min (by C-G formula based on SCr of 1.16 mg/dL).   Medical History: Past Medical History:  Diagnosis Date  . Anemia   . Asthma   . Cataracts, bilateral    worse in Rt eye  . Collagen vascular disease (HCC)   . COPD (chronic obstructive pulmonary disease) (HCC)    . Coronary artery disease   . Depression   . DVT (deep venous thrombosis) (HCC)   . Dysrhythmia   . Emphysema/COPD (HCC)   . Environmental and seasonal allergies   . H/O blood clots   . Heart murmur   . HIV (human immunodeficiency virus infection) (HCC)   . Hypertension   . Leaky heart valve    x 3  . Lung mass   . Myocardial infarction (HCC)    in 2000  . Pneumonia    year ago  . Pulmonary emboli (HCC)   . Type 2 diabetes mellitus [facility])      Assessment: 61 yo male with recurrent DVT while on Lovenox PTA, and hx of PE now transitioning from Lovenox treatment dose to Heparin drip.   See Hematology MD note 8/5: Developed DVT/PE while on Apixaban august 2017, was discharged on Warfarin. July 2018 had recurrent DVT while INR was therapeutic and has IVC filter. He was switched at that time to Lovenox 1mg /kg bid.  Goal of Therapy:  Heparin level  0.3-0.7 units/ml aPTT 66-102 seconds Monitor platelets by anticoagulation protocol: Yes   Plan:  Next Lovenox dose was due at 2100. Per discussion with Dr. Smith Robertao, will start Heparin drip at 2300 (AntiXa level for lovenox was elevated at 2.0 units/ml so will start Heparin drip a little later and no bolus). Start Heparin drip at 2300 03/13/17 at 1400 units/hr Check AntiXa (Heparin level) in 6 hours and daily while on Heparin. Continue to monitor H&H and platelets. Will check aPTT since patient has been on Lovenox.  If aPTT and Heparin level correlate then can continue with just Heparin levels. If they do not correlate would follow aPTTs and check Heparin level until they both correlate   Note: Patient having Head CT to r/o bleeding etc. Would not start Heparin Drip until results of CT are known.  8/6 @ 0500 aPTT 103 therapeutic; HL 2.28 supratherapeutic.. Will continue current rate and will recheck HL/aPTT @ 1100. Will continue to dose off of HL.  Thomasene Rippleavid Juliahna Wiswell, PharmD, BCPS Clinical Pharmacist 03/14/2017

## 2017-03-14 NOTE — Progress Notes (Signed)
Patient alert and oriented. Denies chest pain, SOB, nausea, weakness. Tele monitor in place. Lungs and heart sounds normal. Patient complaining of abdominal discomfort but does not want to take anything for it. Oriented to room and callbell system.   Harvie HeckMelanie Nithin Demeo, RN

## 2017-03-14 NOTE — Progress Notes (Signed)
ANTICOAGULATION CONSULT NOTE - Initial Consult  Pharmacy Consult for Heparin drip Indication: DVT  Allergies  Allergen Reactions  . Aspirin Anaphylaxis  . Bee Venom Anaphylaxis  . Penicillins Anaphylaxis and Other (See Comments)    Has patient had a PCN reaction causing immediate rash, facial/tongue/throat swelling, SOB or lightheadedness with hypotension: Yes Has patient had a PCN reaction causing severe rash involving mucus membranes or skin necrosis: No Has patient had a PCN reaction that required hospitalization No Has patient had a PCN reaction occurring within the last 10 years: Yes If all of the above answers are "NO", then may proceed with Cephalosporin use.  . Prednisone Anaphylaxis  . Sulfa Antibiotics Anaphylaxis  . Sulfasalazine Anaphylaxis  . Theophyllines Anaphylaxis  . Theophylline Swelling    Patient Measurements: Height: 5\' 5"  (165.1 cm) Weight: 192 lb (87.1 kg) IBW/kg (Calculated) : 61.5 Heparin Dosing Weight: 79.9 kg  Vital Signs: Temp: 99.1 F (37.3 C) (08/06 0838) Temp Source: Oral (08/06 0838) BP: 151/81 (08/06 0838) Pulse Rate: 89 (08/06 0838)  Labs:  Recent Labs  03/12/17 0321  03/13/17 0337 03/13/17 1421 03/13/17 2059 03/14/17 0446 03/14/17 1046  HGB 12.0*  --  11.3*  --   --  12.4*  --   HCT 35.3*  --  32.9*  --   --  36.9*  --   PLT 308  --  269  --   --  294  --   APTT 33  --   --   --  33 103* 102*  LABPROT 13.0  --   --   --   --   --   --   INR 0.98  --   --   --   --   --   --   HEPARINUNFRC  --   < >  --   --  1.66* 2.28* 2.48*  HEPRLOWMOCWT  --   --   --  2.00  --   --   --   CREATININE 1.16  --  1.16  --   --   --   --   TROPONINI <0.03  --   --   --   --   --   --   < > = values in this interval not displayed.  Estimated Creatinine Clearance: 69.5 mL/min (by C-G formula based on SCr of 1.16 mg/dL).   Medical History: Past Medical History:  Diagnosis Date  . Anemia   . Asthma   . Cataracts, bilateral    worse in Rt  eye  . Collagen vascular disease (HCC)   . COPD (chronic obstructive pulmonary disease) (HCC)   . Coronary artery disease   . Depression   . DVT (deep venous thrombosis) (HCC)   . Dysrhythmia   . Emphysema/COPD (HCC)   . Environmental and seasonal allergies   . H/O blood clots   . Heart murmur   . HIV (human immunodeficiency virus infection) (HCC)   . Hypertension   . Leaky heart valve    x 3  . Lung mass   . Myocardial infarction (HCC)    in 2000  . Pneumonia    year ago  . Pulmonary emboli (HCC)   . 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   See Hematology MD note 8/5: Developed DVT/PE while on Apixaban august  2017, was discharged on Warfarin. July 2018 had recurrent DVT while INR was therapeutic and has IVC filter. He was switched at that time to Lovenox 1mg /kg bid.  Goal of Therapy:  Heparin level 0.3-0.7 units/ml aPTT 66-102 seconds Monitor platelets by anticoagulation protocol: Yes   Plan:  APTT = 102 seconds (upper end of therapeutic range), HL remains elevated. Will decrease infusion to 1250 units/hr and recheck HL in 4-6 hours.  Of note, the baseline HL of 1.66 on 8/5 was elevated prior to initiation of heparin drip, likely attributed to the fact the patient was on LMWH. Per pharmacist discussion with lab - there are no tests to differentiate between UFH and LMWH specifically.   At this time, will continue to dose off of APTT until APTT and HL correlate. Spoke with RN who confirmed no signs/symptoms of bleeding.  Cindi Carbon, PharmD, BCPS Clinical Pharmacist 03/14/2017

## 2017-03-14 NOTE — Progress Notes (Signed)
Brian Hampton   DOB:1955/10/10   ZO#:[id number]    Subjective: Complains of continued swelling of the right lower extremity. Also complains of chronic mild right-sided weakness. Denies any headaches. Denies any nausea vomiting or bleeding.  Patient complains of shortness of breath with exertion. Mild cough. No diarrhea.  Objective:  Vitals:   03/14/17 0838 03/14/17 1625  BP: (!) 151/81 130/82  Pulse: 89 74  Resp: 16 18  Temp: 99.1 F (37.3 C) 98.6 F (37 C)     Intake/Output Summary (Last 24 hours) at 03/14/17 1842 Last data filed at 03/14/17 1812  Gross per 24 hour  Intake            898.5 ml  Output              925 ml  Net            -26.5 ml    GENERAL Alert, no distress and comfortable. Resting in the bed. EYES: no pallor or icterus OROPHARYNX: no thrush or ulceration. NECK: supple, no masses felt LYMPH:  no palpable lymphadenopathy in the cervical, axillary or inguinal regions LUNGS: decreased breath sounds to auscultation at bases and  No wheeze or crackles HEART/CVS: regular rate & rhythm and no murmurs; right lower extremity swelling compared to the left. Mildly tender. ABDOMEN: abdomen soft, tender  on deep palpation. and normal bowel sounds Musculoskeletal:no cyanosis of digits and no clubbing  PSYCH: alert & oriented x 3 with fluent speech NEURO: no focal motor/sensory deficits SKIN:  no rashes or significant lesions   Labs:  Lab Results  Component Value Date   WBC 14.9 (H) 03/14/2017   HGB 12.1 (L) 03/14/2017   HCT 36.7 (L) 03/14/2017   MCV 98.9 03/14/2017   PLT 347 03/14/2017   NEUTROABS 5.1 11/26/2016    Lab Results  Component Value Date   NA 140 03/13/2017   K 3.9 03/13/2017   CL 110 03/13/2017   CO2 25 03/13/2017    Studies:  Ct Angio Head W Or Wo Contrast  Result Date: 03/13/2017 CLINICAL DATA:  61 y/o  M; 1 hour of right-sided numbness. EXAM: CT ANGIOGRAPHY HEAD TECHNIQUE: Multidetector CT imaging of the head was performed using the standard  protocol during bolus administration of intravenous contrast. Multiplanar CT image reconstructions and MIPs were obtained to evaluate the vascular anatomy. CONTRAST:  75 cc Isovue 370 COMPARISON:  01/30/2016 MRI of the head.  03/13/2017 CT of the head. FINDINGS: CTA HEAD Anterior circulation: No significant stenosis, proximal occlusion, aneurysm, or vascular malformation. Posterior circulation: No significant stenosis, proximal occlusion, aneurysm, or vascular malformation. Venous sinuses: As permitted by contrast timing, patent. Anatomic variants: Bilateral fetal PCA. Small caliber vertebrobasilar system with similar small flow voids on the prior MRI of the brain, likely anatomic variant due to bilateral fetal PCA. Delayed phase: No abnormal intracranial enhancement. IMPRESSION: Patent circle of Willis. No large vessel occlusion, aneurysm, or significant stenosis. Variant anatomy with bilateral fetal PCA and small caliber vertebrobasilar system. Electronically Signed   By: [name] M.D.   On: 03/13/2017 16:38   Dg Chest 2 View  Result Date: 03/13/2017 CLINICAL DATA:  Former smoker hx of COPD, asthma, CAD admitted with multiple blood clots (per patient). EXAM: CHEST  2 VIEW COMPARISON:  Chest x-ray dated 11/26/2016. Chest CT dated 03/03/2017. FINDINGS: Heart size and mediastinal contours are within normal limits. Lungs appear clear. No pleural effusion or pneumothorax seen. Stable mild compression deformity of a mid thoracic vertebral body.  No acute or suspicious osseous finding seen. IVC filter noted in the upper abdomen. IMPRESSION: No active cardiopulmonary disease. No evidence of pneumonia or pulmonary edema. Electronically Signed   By: Bary RichardStan  Maynard M.D.   On: 03/13/2017 11:27   Ct Head Wo Contrast  Result Date: 03/13/2017 CLINICAL DATA:  Right sided numbness x 1 hour ago EXAM: CT HEAD WITHOUT CONTRAST TECHNIQUE: Contiguous axial images were obtained from the base of the skull through the  vertex without intravenous contrast. COMPARISON:  02/01/2016 FINDINGS: Brain: There is mild central and cortical atrophy. Mild periventricular white matter changes are consistent with small vessel disease. There is no intra or extra-axial fluid collection or mass lesion. The basilar cisterns and ventricles have a normal appearance. There is no CT evidence for acute infarction or hemorrhage. Vascular: No hyperdense vessel or unexpected calcification. Skull: No acute abnormality. Sinuses/Orbits: No acute finding. Other: None IMPRESSION: 1.  No evidence for acute  abnormality. 2. Mild atrophy and small vessel disease. Electronically Signed   By: Norva PavlovElizabeth  Brown M.D.   On: 03/13/2017 16:03   Mr Maxine GlennMra Head Wo Contrast  Result Date: 03/14/2017 CLINICAL DATA:  61 y/o M; increasing weakness in the right upper and lower extremities. EXAM: MRI HEAD WITHOUT CONTRAST MRA HEAD WITHOUT CONTRAST TECHNIQUE: Multiplanar, multiecho pulse sequences of the brain and surrounding structures were obtained without intravenous contrast. Angiographic images of the head were obtained using MRA technique without contrast. COMPARISON:  03/13/2017 CT angiogram of the head. FINDINGS: MRI HEAD FINDINGS Brain: No acute infarction, hemorrhage, hydrocephalus, extra-axial collection or mass lesion. Several nonspecific foci of T2 FLAIR hyperintense signal abnormality in subcortical and periventricular white matter is compatible with moderate chronic microvascular ischemic changes for age. Mild brain parenchymal volume loss. Vascular: As below. Skull and upper cervical spine: Mild cranial settling likely related to history of collagen vascular disease, no significant stenosis of the foramen magnum. Right frontal bone T1 hyperintense focus compatible with hemangioma. Sinuses/Orbits: Post bilateral maxillary antrostomy and ethmoidectomy. No significant paranasal sinus mucosal thickening. No abnormal signal of mastoid air cells. Bilateral intra-ocular  lens replacement. Other: None. MRA HEAD FINDINGS Internal carotid arteries:  Patent. Anterior cerebral arteries:  Patent. Middle cerebral arteries: Patent. Anterior communicating artery: Motion degraded. Posterior communicating arteries:  Patent. Posterior cerebral arteries:  Patent.  Bilateral fetal PCA. Basilar artery:  Patent.  Small in caliber. Vertebral arteries:  Patent.  Small in caliber. Motion degraded study, suboptimal assessment for subtle stenosis or small 2-3 mm aneurysm. IMPRESSION: MRI head: 1. No acute intracranial abnormality. 2. Moderate for age chronic microvascular ischemic changes and mild parenchymal volume loss of the brain. 3. Cranial settling likely related to collagen vascular disease, no significant stenosis of foramen magnum. MRA head: 1. Motion degraded study, suboptimal assessment for subtle stenosis or small aneurysm. 2. Patent circle of Willis. No large vessel occlusion, aneurysm, or significant stenosis is identified. Electronically Signed   By: Mitzi HansenLance  Furusawa-Stratton M.D.   On: 03/14/2017 16:11   Mr Brain Wo Contrast  Result Date: 03/14/2017 CLINICAL DATA:  61 y/o M; increasing weakness in the right upper and lower extremities. EXAM: MRI HEAD WITHOUT CONTRAST MRA HEAD WITHOUT CONTRAST TECHNIQUE: Multiplanar, multiecho pulse sequences of the brain and surrounding structures were obtained without intravenous contrast. Angiographic images of the head were obtained using MRA technique without contrast. COMPARISON:  03/13/2017 CT angiogram of the head. FINDINGS: MRI HEAD FINDINGS Brain: No acute infarction, hemorrhage, hydrocephalus, extra-axial collection or mass lesion. Several nonspecific foci of T2 FLAIR  hyperintense signal abnormality in subcortical and periventricular white matter is compatible with moderate chronic microvascular ischemic changes for age. Mild brain parenchymal volume loss. Vascular: As below. Skull and upper cervical spine: Mild cranial settling likely  related to history of collagen vascular disease, no significant stenosis of the foramen magnum. Right frontal bone T1 hyperintense focus compatible with hemangioma. Sinuses/Orbits: Post bilateral maxillary antrostomy and ethmoidectomy. No significant paranasal sinus mucosal thickening. No abnormal signal of mastoid air cells. Bilateral intra-ocular lens replacement. Other: None. MRA HEAD FINDINGS Internal carotid arteries:  Patent. Anterior cerebral arteries:  Patent. Middle cerebral arteries: Patent. Anterior communicating artery: Motion degraded. Posterior communicating arteries:  Patent. Posterior cerebral arteries:  Patent.  Bilateral fetal PCA. Basilar artery:  Patent.  Small in caliber. Vertebral arteries:  Patent.  Small in caliber. Motion degraded study, suboptimal assessment for subtle stenosis or small 2-3 mm aneurysm. IMPRESSION: MRI head: 1. No acute intracranial abnormality. 2. Moderate for age chronic microvascular ischemic changes and mild parenchymal volume loss of the brain. 3. Cranial settling likely related to collagen vascular disease, no significant stenosis of foramen magnum. MRA head: 1. Motion degraded study, suboptimal assessment for subtle stenosis or small aneurysm. 2. Patent circle of Willis. No large vessel occlusion, aneurysm, or significant stenosis is identified. Electronically Signed   By: [name] M.D.   On: 03/14/2017 16:11   US Abdomen Complete  Result Date: 03/13/2017 CLINICAL DATA:  Nonspecific abdominal pain beginning at 1 p.m. EXAM: ABDOMEN ULTRASOUND COMPLETE COMPARISON:  CT abdomen and pelvis May 05, 2017 FINDINGS: Limited examination due to body habitus and, bowel gas. Gallbladder: Contracted resulting in gallbladder wall thickening. No cholelithiasis. No sonographic Murphy sign noted by sonographer. Common bile duct: Diameter: 3 mm. Liver: No focal lesion identified. Within normal limits in parenchymal echogenicity. Hepatopetal portal vein. IVC: No  abnormality visualized. Pancreas: Predominately obscured. Spleen: Size and appearance within normal limits. Right Kidney: Length: 9.7 cm. Echogenicity within normal limits. No mass or hydronephrosis visualized. Left Kidney: Length: 10.5 cm. Echogenicity within normal limits. No mass or hydronephrosis visualized. Abdominal aorta: No aneurysm visualized.  Distal aorta obscured. Other findings: None. IMPRESSION: No acute abdominal process. Electronically Signed   By: Awilda Metro M.D.   On: 03/13/2017 20:32   US Carotid Bilateral  Result Date: 03/14/2017 CLINICAL DATA:  61 year old male with symptoms of transient ischemic attack EXAM: BILATERAL CAROTID DUPLEX ULTRASOUND TECHNIQUE: Wallace Cullens scale imaging, color Doppler and duplex ultrasound were performed of bilateral carotid and vertebral arteries in the neck. COMPARISON:  None. FINDINGS: Criteria: Quantification of carotid stenosis is based on velocity parameters that correlate the residual internal carotid diameter with NASCET-based stenosis levels, using the diameter of the distal internal carotid lumen as the denominator for stenosis measurement. The following velocity measurements were obtained: RIGHT ICA:  101/24 cm/sec CCA:  100/15 cm/sec SYSTOLIC ICA/CCA RATIO:  1.0 DIASTOLIC ICA/CCA RATIO:  1.6 ECA:  131 cm/sec LEFT ICA:  69/19 cm/sec CCA:  126/23 cm/sec SYSTOLIC ICA/CCA RATIO:  0.6 DIASTOLIC ICA/CCA RATIO:  0.8 ECA:  100 cm/sec RIGHT CAROTID ARTERY: Smooth heterogeneous atherosclerotic plaque in the proximal internal carotid artery. By peak systolic velocity criteria, the estimated stenosis remains less than 50%. RIGHT VERTEBRAL ARTERY:  Patent with antegrade flow. LEFT CAROTID ARTERY: No focal atherosclerotic plaque or evidence of stenosis. LEFT VERTEBRAL ARTERY:  Patent with normal antegrade flow. IMPRESSION: 1. Mild (1-49%) stenosis proximal right internal carotid artery secondary to smooth heterogeneous atherosclerotic plaque. 2. No significant  atherosclerotic plaque or evidence of  stenosis in the left internal carotid artery. 3. Vertebral arteries are patent with normal antegrade flow. Signed, Sterling Big, MD [facility] Electronically Signed   By: Malachy Moan M.D.   On: 03/14/2017 15:08    Assessment & Plan:   # 61 year old male patient with multiple history of DVT/PE- status post multiple lines of anticoagulation- currently admitted to the hospital for worsening right lower extremity swelling while on Lovenox.  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 Anti-10 A level supratherapeutic. Patient is currently on IV heparin. Elevated difficult situation given multiple failed lines of anticoagulation [Eliquis; Coumadin; most recently Lovenox]. As alternative recommend Arixtra 7.5 mg once a day subcutaneous. Also discussed with the patient that he is factor V Leiden heterozygous- which at this time does not change the current management. Patient will need compression stocking after resolution of acute symptoms. Discussed with vascular surgery. No intervention is recommended, given his chronic DVTs.  # Difficulty breathing- congestive heart failure/reviewed cardiology recommendations.  # Right-sided weakness- appears to be chronic. Workup for stroke negative including CT noncontrast and also brain MRI.  # Patient will follow-up with me in approximately 2-3 weeks post discharge; discussed with Dr. Amado Coe.   Earna Coder, MD 03/14/2017  6:42 PM

## 2017-03-15 DIAGNOSIS — G458 Other transient cerebral ischemic attacks and related syndromes: Secondary | ICD-10-CM

## 2017-03-15 LAB — BASIC METABOLIC PANEL
Anion gap: 8 (ref 5–15)
BUN: 30 mg/dL — AB (ref 6–20)
CHLORIDE: 108 mmol/L (ref 101–111)
CO2: 27 mmol/L (ref 22–32)
CREATININE: 1.22 mg/dL (ref 0.61–1.24)
Calcium: 9 mg/dL (ref 8.9–10.3)
GFR calc Af Amer: >60 mL/min (ref >60)
GFR calc non Af Amer: >60 mL/min (ref >60)
Glucose, Bld: 137 mg/dL — ABNORMAL HIGH (ref 65–99)
POTASSIUM: 4.1 mmol/L (ref 3.5–5.1)
Sodium: 143 mmol/L (ref 135–145)

## 2017-03-15 LAB — CBC WITH DIFFERENTIAL/PLATELET
Basophils Absolute: 0 10*3/uL (ref 0–0.1)
Basophils Relative: 0 %
Eosinophils Absolute: 0 10*3/uL (ref 0–0.7)
Eosinophils Relative: 0 %
HEMATOCRIT: 33.9 % — AB (ref 40.0–52.0)
HEMOGLOBIN: 11.6 g/dL — AB (ref 13.0–18.0)
LYMPHS ABS: 0.7 10*3/uL — AB (ref 1.0–3.6)
LYMPHS PCT: 7 %
MCH: 33.6 pg (ref 26.0–34.0)
MCHC: 34.1 g/dL (ref 32.0–36.0)
MCV: 98.5 fL (ref 80.0–100.0)
MONO ABS: 0.3 10*3/uL (ref 0.2–1.0)
Monocytes Relative: 3 %
NEUTROS ABS: 9.9 10*3/uL — AB (ref 1.4–6.5)
Neutrophils Relative %: 90 %
Platelets: 299 10*3/uL (ref 150–440)
RBC: 3.44 MIL/uL — ABNORMAL LOW (ref 4.40–5.90)
RDW: 15 % — ABNORMAL HIGH (ref 11.5–14.5)
WBC: 11 10*3/uL — ABNORMAL HIGH (ref 3.8–10.6)

## 2017-03-15 MED ORDER — GUAIFENESIN ER 600 MG PO TB12: 600.0000 mg | ORAL_TABLET | Freq: Two times a day (BID) | ORAL | Status: DC

## 2017-03-15 MED ORDER — FONDAPARINUX SODIUM 7.5 MG/0.6ML ~~LOC~~ SOLN: 7.5000 mg | SUBCUTANEOUS | 0 refills | Status: DC

## 2017-03-15 MED ORDER — HYDROCODONE-ACETAMINOPHEN 5-325 MG PO TABS: 1.0000 | ORAL_TABLET | ORAL | 0 refills | Status: DC | PRN

## 2017-03-15 MED ORDER — ACETAMINOPHEN 325 MG PO TABS: 325.0000 mg | ORAL_TABLET | Freq: Four times a day (QID) | ORAL | Status: AC | PRN

## 2017-03-15 NOTE — Progress Notes (Signed)
SUBJECTIVE: Patient denies any chest pain but still has pain in the leg and breathing better   Vitals:   03/14/17 1625 03/14/17 1935 03/14/17 1948 03/14/17 2301  BP: 130/82 (!) 141/90  114/73  Pulse: 74 77  66  Resp: 18 18  18   Temp: 98.6 F (37 C) 98.6 F (37 C)  98 F (36.7 C)  TempSrc: Oral Oral  Oral  SpO2: 96% 99% 98% 95%  Weight:      Height:        Intake/Output Summary (Last 24 hours) at 03/15/17 0624 Last data filed at 03/15/17 [id number]  Gross per 24 hour  Intake           916.53 ml  Output             1325 ml  Net          -408.47 ml    LABS: Basic Metabolic Panel:  Recent Labs  96/11/5406/05/18 0337 03/13/17 2059 03/15/17 0407  NA 140  --  143  K 3.9  --  4.1  CL 110  --  108  CO2 25  --  27  GLUCOSE 119*  --  137*  BUN 17  --  30*  CREATININE 1.16  --  1.22  CALCIUM 8.7*  --  9.0  MG  --  1.8  --    Liver Function Tests: No results for input(s): AST, ALT, ALKPHOS, BILITOT, PROT, ALBUMIN in the last 72 hours. No results for input(s): LIPASE, AMYLASE in the last 72 hours. CBC:  Recent Labs  03/14/17 1715 03/15/17 0407  WBC 14.9* 11.0*  NEUTROABS  --  9.9*  HGB 12.1* 11.6*  HCT 36.7* 33.9*  MCV 98.9 98.5  PLT 347 299   Cardiac Enzymes: No results for input(s): CKTOTAL, CKMB, CKMBINDEX, TROPONINI in the last 72 hours. BNP: Invalid input(s): POCBNP D-Dimer: No results for input(s): DDIMER in the last 72 hours. Hemoglobin A1C: No results for input(s): HGBA1C in the last 72 hours. Fasting Lipid Panel: No results for input(s): CHOL, HDL, LDLCALC, TRIG, CHOLHDL, LDLDIRECT in the last 72 hours. Thyroid Function Tests: No results for input(s): TSH, T4TOTAL, T3FREE, THYROIDAB in the last 72 hours.  Invalid input(s): FREET3 Anemia Panel: No results for input(s): VITAMINB12, FOLATE, FERRITIN, TIBC, IRON, RETICCTPCT in the last 72 hours.   PHYSICAL EXAM General: Well developed, well nourished, in no acute distress HEENT:  Normocephalic and  atramatic Neck:  No JVD.  Lungs: Clear bilaterally to auscultation and percussion. Heart: HRRR . Normal S1 and S2 without gallops or murmurs.  Abdomen: Bowel sounds are positive, abdomen soft and non-tender  Msk:  Back normal, normal gait. Normal strength and tone for age. Extremities: No clubbing, cyanosis or edema.   Neuro: Alert and oriented X 3. Psych:  Good affect, responds appropriately  TELEMETRY:Sinus rhythm  ASSESSMENT AND PLAN: Right leg DVT with resistance to therapeutic INR while on Coumadin and failed Lovenox therapy also. Advise giving DVT protocol for Xarelto and have pharmacy calculate usually just 15 mg by mouth twice a day for 21 days and then 20 mg once a day. Then patient can be discharged with follow-up in the office as usual. [facility] me in case he still in the hospital.  Active Problems:   DVT (deep vein thrombosis) in pregnancy (HCC)    Laurier NancyKHAN,SHAUKAT A, MD, [facility] CenterFACC 03/15/2017 6:24 AM

## 2017-03-15 NOTE — Consult Note (Signed)
Reason for Consult: R sided weakness  Referring Physician: Dr. Amado Coe  CC: R side weakness    R side significantly improved.  He is remote via R hand.  Past Medical History:  Diagnosis Date  . Anemia   . Asthma   . Cataracts, bilateral    worse in Rt eye  . Collagen vascular disease (HCC)   . COPD (chronic obstructive pulmonary disease) (HCC)   . Coronary artery disease   . Depression   . DVT (deep venous thrombosis) (HCC)   . Dysrhythmia   . Emphysema/COPD (HCC)   . Environmental and seasonal allergies   . H/O blood clots   . Heart murmur   . HIV (human immunodeficiency virus infection) (HCC)   . Hypertension   . Leaky heart valve    x 3  . Lung mass   . Myocardial infarction (HCC)    in 2000  . Pneumonia    year ago  . Pulmonary emboli (HCC)   . Type 2 diabetes mellitus (HCC)     Past Surgical History:  Procedure Laterality Date  . ANKLE ARTHROSCOPY    . IVC FILTER INSERTION    . KYPHOPLASTY N/A 12/21/2016   Procedure: KYPHOPLASTY;  Surgeon: Kennedy Bucker, MD;  Location: [facility] ORS;  Service: Orthopedics;  Laterality: N/A;  . PERIPHERAL VASCULAR CATHETERIZATION N/A 03/16/2016   Procedure: IVC Filter Insertion;  Surgeon: Renford Dills, MD;  Location: [facility];  Service: Cardiovascular;  Laterality: N/A;  . SINUS EXPLORATION    . WRIST ARTHROSCOPY Right     Family History  Problem Relation Age of Onset  . CAD Unknown   . Asthma Mother   . Cirrhosis Father   . Deep vein thrombosis Neg Hx   . Diabetes Neg Hx     Social History:  reports that he quit smoking about 15 years ago. He has never used smokeless tobacco. He reports that he does not drink alcohol or use drugs.  Allergies  Allergen Reactions  . Aspirin Anaphylaxis  . Bee Venom Anaphylaxis  . Penicillins Anaphylaxis and Other (See Comments)    Has patient had a PCN reaction causing immediate rash, facial/tongue/throat swelling, SOB or lightheadedness with hypotension: Yes Has patient had a  PCN reaction causing severe rash involving mucus membranes or skin necrosis: No Has patient had a PCN reaction that required hospitalization No Has patient had a PCN reaction occurring within the last 10 years: Yes If all of the above answers are "NO", then may proceed with Cephalosporin use.  . Prednisone Anaphylaxis  . Sulfa Antibiotics Anaphylaxis  . Sulfasalazine Anaphylaxis  . Theophyllines Anaphylaxis  . Theophylline Swelling    Medications: I have reviewed the patient's current medications.  ROS: History obtained from the patient  General ROS: negative for - chills, fatigue, fever, night sweats, weight gain or weight loss Psychological ROS: negative for - behavioral disorder, hallucinations, memory difficulties, mood swings or suicidal ideation Ophthalmic ROS: negative for - blurry vision, double vision, eye pain or loss of vision ENT ROS: negative for - epistaxis, nasal discharge, oral lesions, sore throat, tinnitus or vertigo Allergy and Immunology ROS: negative for - hives or itchy/watery eyes Hematological and Lymphatic ROS:  Hx of multiple PEs Endocrine ROS: negative for - galactorrhea, hair pattern changes, polydipsia/polyuria or temperature intolerance Respiratory ROS: negative for - cough, hemoptysis, shortness of breath or wheezing Cardiovascular ROS: negative for - chest pain, dyspnea on exertion, edema or irregular heartbeat Gastrointestinal ROS: negative for - abdominal  pain, diarrhea, hematemesis, nausea/vomiting or stool incontinence Genito-Urinary ROS: negative for - dysuria, hematuria, incontinence or urinary frequency/urgency Musculoskeletal ROS: negative for - joint swelling or muscular weakness Neurological ROS: as noted in HPI Dermatological ROS: negative for rash and skin lesion changes  Physical Examination: Blood pressure 129/88, pulse 76, temperature 98 F (36.7 C), temperature source Oral, resp. rate 16, height 5\' 5"  (1.651 m), weight 87.1 kg (192 lb),  SpO2 96 %.   Neurological Examination   Mental Status: Alert, oriented to place and time  Cranial Nerves: II: Discs flat bilaterally; Visual fields grossly normal, pupils equal, round, reactive to light and accommodation III,IV, VI: ptosis not present, extra-ocular motions intact bilaterally V,VII: smile symmetric, facial light touch sensation normal bilaterally VIII: hearing normal bilaterally IX,X: gag reflex present XI: bilateral shoulder shrug XII: midline tongue extension Motor: Right : Upper extremity   4+/5    Left:     Upper extremity   4+/5  Lower extremity   3/5     Lower extremity   4+/5 Tone and bulk:normal tone throughout; no atrophy noted Sensory: Pinprick and light touch intact throughout, bilaterally Deep Tendon Reflexes: 2+ and symmetric throughout Plantars: Right: downgoing   Left: downgoing Cerebellar: Not tested Gait: not tested      Laboratory Studies:   Basic Metabolic Panel:  Recent Labs Lab 03/12/17 0321 03/13/17 0337 03/13/17 2059 03/15/17 0407  NA 140 140  --  143  K 3.4* 3.9  --  4.1  CL 110 110  --  108  CO2 24 25  --  27  GLUCOSE 91 119*  --  137*  BUN 15 17  --  30*  CREATININE 1.16 1.16  --  1.22  CALCIUM 8.7* 8.7*  --  9.0  MG  --   --  1.8  --     Liver Function Tests: No results for input(s): AST, ALT, ALKPHOS, BILITOT, PROT, ALBUMIN in the last 168 hours. No results for input(s): LIPASE, AMYLASE in the last 168 hours. No results for input(s): AMMONIA in the last 168 hours.  CBC:  Recent Labs Lab 03/12/17 0321 03/13/17 0337 03/14/17 0446 03/14/17 1715 03/15/17 0407  WBC 6.7 5.4 9.3 14.9* 11.0*  NEUTROABS  --   --   --   --  9.9*  HGB 12.0* 11.3* 12.4* 12.1* 11.6*  HCT 35.3* 32.9* 36.9* 36.7* 33.9*  MCV 99.5 100.3* 100.6* 98.9 98.5  PLT 308 269 294 347 299    Cardiac Enzymes:  Recent Labs Lab 03/12/17 0321  TROPONINI <0.03    BNP: Invalid input(s): POCBNP  CBG: No results for input(s): GLUCAP in the  last 168 hours.  Microbiology: Results for orders placed or performed during the hospital encounter of 12/21/16  Gastrointestinal Panel by PCR , Stool     Status: None   Collection Time: 12/22/16  3:41 PM  Result Value Ref Range Status   Campylobacter species NOT DETECTED NOT DETECTED Final   Plesimonas shigelloides NOT DETECTED NOT DETECTED Final   Salmonella species NOT DETECTED NOT DETECTED Final   Yersinia enterocolitica NOT DETECTED NOT DETECTED Final   Vibrio species NOT DETECTED NOT DETECTED Final   Vibrio cholerae NOT DETECTED NOT DETECTED Final   Enteroaggregative E coli (EAEC) NOT DETECTED NOT DETECTED Final   Enteropathogenic E coli (EPEC) NOT DETECTED NOT DETECTED Final   Enterotoxigenic E coli (ETEC) NOT DETECTED NOT DETECTED Final   Shiga like toxin producing E coli (STEC) NOT DETECTED NOT DETECTED Final  Shigella/Enteroinvasive E coli (EIEC) NOT DETECTED NOT DETECTED Final   Cryptosporidium NOT DETECTED NOT DETECTED Final   Cyclospora cayetanensis NOT DETECTED NOT DETECTED Final   Entamoeba histolytica NOT DETECTED NOT DETECTED Final   Giardia lamblia NOT DETECTED NOT DETECTED Final   Adenovirus F40/41 NOT DETECTED NOT DETECTED Final   Astrovirus NOT DETECTED NOT DETECTED Final   Norovirus GI/GII NOT DETECTED NOT DETECTED Final   Rotavirus A NOT DETECTED NOT DETECTED Final   Sapovirus (I, II, IV, and V) NOT DETECTED NOT DETECTED Final  C difficile quick scan w PCR reflex     Status: None   Collection Time: 12/22/16  3:41 PM  Result Value Ref Range Status   C Diff antigen NEGATIVE NEGATIVE Final   C Diff toxin NEGATIVE NEGATIVE Final   C Diff interpretation No C. difficile detected.  Final    Coagulation Studies: No results for input(s): LABPROT, INR in the last 72 hours.  Urinalysis: No results for input(s): COLORURINE, LABSPEC, PHURINE, GLUCOSEU, HGBUR, BILIRUBINUR, KETONESUR, PROTEINUR, UROBILINOGEN, NITRITE, LEUKOCYTESUR in the last 168 hours.  Invalid  input(s): APPERANCEUR  Lipid Panel:     Component Value Date/Time   CHOL 163 12/22/2016 0504   TRIG 190 (H) 12/22/2016 0504   HDL 40 (L) 12/22/2016 0504   CHOLHDL 4.1 12/22/2016 0504   VLDL 38 12/22/2016 0504   LDLCALC 85 12/22/2016 0504    HgbA1C:  Lab Results  Component Value Date   HGBA1C 5.9 (H) 03/04/2017    Urine Drug Screen:  No results found for: LABOPIA, COCAINSCRNUR, LABBENZ, AMPHETMU, THCU, LABBARB  Alcohol Level: No results for input(s): ETH in the last 168 hours.  Other results:  Imaging: Ct Angio Head W Or Wo Contrast  Result Date: 03/13/2017 CLINICAL DATA:  61 y/o  M; 1 hour of right-sided numbness. EXAM: CT ANGIOGRAPHY HEAD TECHNIQUE: Multidetector CT imaging of the head was performed using the standard protocol during bolus administration of intravenous contrast. Multiplanar CT image reconstructions and MIPs were obtained to evaluate the vascular anatomy. CONTRAST:  75 cc Isovue 370 COMPARISON:  01/30/2016 MRI of the head.  03/13/2017 CT of the head. FINDINGS: CTA HEAD Anterior circulation: No significant stenosis, proximal occlusion, aneurysm, or vascular malformation. Posterior circulation: No significant stenosis, proximal occlusion, aneurysm, or vascular malformation. Venous sinuses: As permitted by contrast timing, patent. Anatomic variants: Bilateral fetal PCA. Small caliber vertebrobasilar system with similar small flow voids on the prior MRI of the brain, likely anatomic variant due to bilateral fetal PCA. Delayed phase: No abnormal intracranial enhancement. IMPRESSION: Patent circle of Willis. No large vessel occlusion, aneurysm, or significant stenosis. Variant anatomy with bilateral fetal PCA and small caliber vertebrobasilar system. Electronically Signed   By: [name] M.D.   On: 03/13/2017 16:38   Ct Head Wo Contrast  Result Date: 03/13/2017 CLINICAL DATA:  Right sided numbness x 1 hour ago EXAM: CT HEAD WITHOUT CONTRAST TECHNIQUE: Contiguous  axial images were obtained from the base of the skull through the vertex without intravenous contrast. COMPARISON:  02/01/2016 FINDINGS: Brain: There is mild central and cortical atrophy. Mild periventricular white matter changes are consistent with small vessel disease. There is no intra or extra-axial fluid collection or mass lesion. The basilar cisterns and ventricles have a normal appearance. There is no CT evidence for acute infarction or hemorrhage. Vascular: No hyperdense vessel or unexpected calcification. Skull: No acute abnormality. Sinuses/Orbits: No acute finding. Other: None IMPRESSION: 1.  No evidence for acute  abnormality. 2. Mild atrophy  and small vessel disease. Electronically Signed   By: Norva PavlovElizabeth  Brown M.D.   On: 03/13/2017 16:03   Mr Maxine GlennMra Head Wo Contrast  Result Date: 03/14/2017 CLINICAL DATA:  [age] y/o M; increasing weakness in the right upper and lower extremities. EXAM: MRI HEAD WITHOUT CONTRAST MRA HEAD WITHOUT CONTRAST TECHNIQUE: Multiplanar, multiecho pulse sequences of the brain and surrounding structures were obtained without intravenous contrast. Angiographic images of the head were obtained using MRA technique without contrast. COMPARISON:  03/13/2017 CT angiogram of the head. FINDINGS: MRI HEAD FINDINGS Brain: No acute infarction, hemorrhage, hydrocephalus, extra-axial collection or mass lesion. Several nonspecific foci of T2 FLAIR hyperintense signal abnormality in subcortical and periventricular white matter is compatible with moderate chronic microvascular ischemic changes for age. Mild brain parenchymal volume loss. Vascular: As below. Skull and upper cervical spine: Mild cranial settling likely related to history of collagen vascular disease, no significant stenosis of the foramen magnum. Right frontal bone T1 hyperintense focus compatible with hemangioma. Sinuses/Orbits: Post bilateral maxillary antrostomy and ethmoidectomy. No significant paranasal sinus mucosal thickening.  No abnormal signal of mastoid air cells. Bilateral intra-ocular lens replacement. Other: None. MRA HEAD FINDINGS Internal carotid arteries:  Patent. Anterior cerebral arteries:  Patent. Middle cerebral arteries: Patent. Anterior communicating artery: Motion degraded. Posterior communicating arteries:  Patent. Posterior cerebral arteries:  Patent.  Bilateral fetal PCA. Basilar artery:  Patent.  Small in caliber. Vertebral arteries:  Patent.  Small in caliber. Motion degraded study, suboptimal assessment for subtle stenosis or small 2-3 mm aneurysm. IMPRESSION: MRI head: 1. No acute intracranial abnormality. 2. Moderate for age chronic microvascular ischemic changes and mild parenchymal volume loss of the brain. 3. Cranial settling likely related to collagen vascular disease, no significant stenosis of foramen magnum. MRA head: 1. Motion degraded study, suboptimal assessment for subtle stenosis or small aneurysm. 2. Patent circle of Willis. No large vessel occlusion, aneurysm, or significant stenosis is identified. Electronically Signed   By: Mitzi HansenLance  Furusawa-Stratton M.D.   On: 03/14/2017 16:11   Mr Brain Wo Contrast  Result Date: 03/14/2017 CLINICAL DATA:  61 y/o M; increasing weakness in the right upper and lower extremities. EXAM: MRI HEAD WITHOUT CONTRAST MRA HEAD WITHOUT CONTRAST TECHNIQUE: Multiplanar, multiecho pulse sequences of the brain and surrounding structures were obtained without intravenous contrast. Angiographic images of the head were obtained using MRA technique without contrast. COMPARISON:  03/13/2017 CT angiogram of the head. FINDINGS: MRI HEAD FINDINGS Brain: No acute infarction, hemorrhage, hydrocephalus, extra-axial collection or mass lesion. Several nonspecific foci of T2 FLAIR hyperintense signal abnormality in subcortical and periventricular white matter is compatible with moderate chronic microvascular ischemic changes for age. Mild brain parenchymal volume loss. Vascular: As below.  Skull and upper cervical spine: Mild cranial settling likely related to history of collagen vascular disease, no significant stenosis of the foramen magnum. Right frontal bone T1 hyperintense focus compatible with hemangioma. Sinuses/Orbits: Post bilateral maxillary antrostomy and ethmoidectomy. No significant paranasal sinus mucosal thickening. No abnormal signal of mastoid air cells. Bilateral intra-ocular lens replacement. Other: None. MRA HEAD FINDINGS Internal carotid arteries:  Patent. Anterior cerebral arteries:  Patent. Middle cerebral arteries: Patent. Anterior communicating artery: Motion degraded. Posterior communicating arteries:  Patent. Posterior cerebral arteries:  Patent.  Bilateral fetal PCA. Basilar artery:  Patent.  Small in caliber. Vertebral arteries:  Patent.  Small in caliber. Motion degraded study, suboptimal assessment for subtle stenosis or small 2-3 mm aneurysm. IMPRESSION: MRI head: 1. No acute intracranial abnormality. 2. Moderate for age chronic microvascular ischemic  changes and mild parenchymal volume loss of the brain. 3. Cranial settling likely related to collagen vascular disease, no significant stenosis of foramen magnum. MRA head: 1. Motion degraded study, suboptimal assessment for subtle stenosis or small aneurysm. 2. Patent circle of Willis. No large vessel occlusion, aneurysm, or significant stenosis is identified. Electronically Signed   By: [name] M.D.   On: 03/14/2017 16:11   US Abdomen Complete  Result Date: 03/13/2017 CLINICAL DATA:  Nonspecific abdominal pain beginning at 1 p.m. EXAM: ABDOMEN ULTRASOUND COMPLETE COMPARISON:  CT abdomen and pelvis May 05, 2017 FINDINGS: Limited examination due to body habitus and, bowel gas. Gallbladder: Contracted resulting in gallbladder wall thickening. No cholelithiasis. No sonographic Murphy sign noted by sonographer. Common bile duct: Diameter: 3 mm. Liver: No focal lesion identified. Within normal limits  in parenchymal echogenicity. Hepatopetal portal vein. IVC: No abnormality visualized. Pancreas: Predominately obscured. Spleen: Size and appearance within normal limits. Right Kidney: Length: 9.7 cm. Echogenicity within normal limits. No mass or hydronephrosis visualized. Left Kidney: Length: 10.5 cm. Echogenicity within normal limits. No mass or hydronephrosis visualized. Abdominal aorta: No aneurysm visualized.  Distal aorta obscured. Other findings: None. IMPRESSION: No acute abdominal process. Electronically Signed   By: Awilda Metro M.D.   On: 03/13/2017 20:32   US Carotid Bilateral  Result Date: 03/14/2017 CLINICAL DATA:  61 year old male with symptoms of transient ischemic attack EXAM: BILATERAL CAROTID DUPLEX ULTRASOUND TECHNIQUE: Wallace Cullens scale imaging, color Doppler and duplex ultrasound were performed of bilateral carotid and vertebral arteries in the neck. COMPARISON:  None. FINDINGS: Criteria: Quantification of carotid stenosis is based on velocity parameters that correlate the residual internal carotid diameter with NASCET-based stenosis levels, using the diameter of the distal internal carotid lumen as the denominator for stenosis measurement. The following velocity measurements were obtained: RIGHT ICA:  101/24 cm/sec CCA:  100/15 cm/sec SYSTOLIC ICA/CCA RATIO:  1.0 DIASTOLIC ICA/CCA RATIO:  1.6 ECA:  131 cm/sec LEFT ICA:  69/19 cm/sec CCA:  126/23 cm/sec SYSTOLIC ICA/CCA RATIO:  0.6 DIASTOLIC ICA/CCA RATIO:  0.8 ECA:  100 cm/sec RIGHT CAROTID ARTERY: Smooth heterogeneous atherosclerotic plaque in the proximal internal carotid artery. By peak systolic velocity criteria, the estimated stenosis remains less than 50%. RIGHT VERTEBRAL ARTERY:  Patent with antegrade flow. LEFT CAROTID ARTERY: No focal atherosclerotic plaque or evidence of stenosis. LEFT VERTEBRAL ARTERY:  Patent with normal antegrade flow. IMPRESSION: 1. Mild (1-49%) stenosis proximal right internal carotid artery secondary to smooth  heterogeneous atherosclerotic plaque. 2. No significant atherosclerotic plaque or evidence of stenosis in the left internal carotid artery. 3. Vertebral arteries are patent with normal antegrade flow. Signed, Sterling Big, MD [facility] Electronically Signed   By: Malachy Moan M.D.   On: 03/14/2017 15:08     Assessment/Plan:  61 y.o. male  with past medical history significant for history of asthma, collagen vascular disease, COPD, coronary artery disease, recurrent DVTs, hypertension, HIV, pulmonary embolism, who is on Lovenox , who presents to the hospital with right lower extremity swelling and pain. Doppler ultrasound revealed progression of DVT in the right lower extremity. Neurology consulted for increased weakness on RUE and RLE since yesterday. States not associated with pain. Prior [facility] and CTA no abnormalities   Today controlling remote with R hand. Significant improvement.  R Leg still weak. Appreciate Pt input MRI/MRA no abnormalities.  No further imaging Con't anticoagulation.     03/15/2017, 11:01 AM

## 2017-03-15 NOTE — Evaluation (Signed)
Physical Therapy Evaluation Patient Details Name: Brian Hampton MRN: [id number] DOB: 01/08/1956 Today's Date: 03/15/2017   History of Present Illness  Brian Hampton is a 61 y.o. male with a known history of anemia, bronchial asthma, collagen vascular disease, COPD, coronary artery disease, recurrent DVT, hypertension, HIV infection, pulmonary embolism currently on subcutaneous Lovenox anticoagulation presented to the emergency room with right leg pain. He has increased swelling in the right lower extremity and increased pain. The pain is aching in nature 6 out of 10 scale of 1-10. Patient was evaluated in the emergency room. He was worked up with a vascular Doppler ultrasound of the right lower extremity which showed increased progression in the deep venous thrombosis in the right lower extremity. Patient was started on IV heparin drip and case was discussed with hematology attending who recommended admission and continued heparin drip. Pt is currently admitted for medication management of anticoagulants secondary to DVT.    Clinical Impression  Pt admitted with above diagnosis. Pt currently with functional limitations due to the deficits listed below (see PT Problem List).  Pt demonstrates independent bed mobility and only requires supervision for transfers. He is able to ambulate a full lap around RN station with rolling walker initially but able to progress to no assistive device. VSS with ambulation and pt denies DOE or chest pain worsening. He reports chronic central chest pain for multiple years. Pt able to perform vertical and horizontal head turns with slight slowing of speed but good stability. Five time sit to stand test is 17.9s which is above cut-off for increased risk for falls. Once in standing pt is stable with feet in wide and narrow positions but single leg balance is impaired. Pt is safe to return to his boarding house and resume HH PT once medically appropriate. Pt will benefit from PT services  to address deficits in strength, balance, and mobility in order to return to full function at home.      Follow Up Recommendations Home health PT;Other (comment) (Resume HH PT at discharge for balance/strength)    Equipment Recommendations  None recommended by PT;Other (comment) (Use quad cane as needed for additional stability)    Recommendations for Other Services       Precautions / Restrictions Precautions Precautions: Fall Restrictions Weight Bearing Restrictions: No      Mobility  Bed Mobility Overal bed mobility: Independent             General bed mobility comments: Good speed/sequencing  Transfers Overall transfer level: Needs assistance Equipment used: Rolling walker (2 wheeled) Transfers: Sit to/from Stand Sit to Stand: Supervision         General transfer comment: Pt demonstrates fair speed with sit to stand transfers. 5TSTS is 17.9s which is above cut-off for increased risk for falls. Once in standing pt is stable with feet in wide position  Ambulation/Gait Ambulation/Gait assistance: Min guard Ambulation Distance (Feet): 220 Feet Assistive device: Rolling walker (2 wheeled) Gait Pattern/deviations: Step-through pattern;Decreased step length - left;Antalgic;Decreased step length - right Gait velocity: WFL for limited community mobility Gait velocity interpretation: Below normal speed for age/gender General Gait Details: Pt able to ambulate a full lap around RN station with rolling walker initially but able to progress to no assistive device. VSS with ambulation and pt denies DOE or chest pain worsening. He reports chronic central chest pain for multiple years. Pt able to perform vertical and horizontal head turns with slight slowing of speed but good stability  Stairs  Wheelchair Mobility    Modified Rankin (Stroke Patients Only)       Balance Overall balance assessment: Needs assistance Sitting-balance support: No upper  extremity supported;Feet supported Sitting balance-Leahy Scale: Good     Standing balance support: No upper extremity supported Standing balance-Leahy Scale: Fair Standing balance comment: Pt able to maintain wide and narrow stance without UE support. Negative Rhomberg. Single leg balance is only 1-2 seconds bilaterally                             Pertinent Vitals/Pain Pain Assessment: 0-10 Pain Score: 10-Worst pain ever Pain Location: RLE Pain Descriptors / Indicators: Sharp Pain Intervention(s): Monitored during session    Home Living Family/patient expects to be discharged to:: Other (Comment) [employer], shared bathroom and kitchen) Living Arrangements: Non-relatives/Friends (Other members of boarding house) Available Help at Discharge: Personal care attendant;Other (Comment) (HH PT currently active, HH RN 2x/wk) Type of Home: House Home Access: Ramped entrance     Home Layout: Multi-level;Able to live on main level with bedroom/bathroom Home Equipment: Dan Humphreys - 2 wheels;Walker - 4 wheels;Cane - quad      Prior Function Level of Independence: Needs assistance   Gait / Transfers Assistance Needed: Uses quad cane for ambulation sometimes. Occasionally uses no assistive device. Multiple falls last August secondary to multiple blood clots in bilateral LEs, "a couple falls" since that time  ADL's / Homemaking Assistance Needed: Assist with ADLs/IADLs        Hand Dominance   Dominant Hand: Right    Extremity/Trunk Assessment   Upper Extremity Assessment Upper Extremity Assessment: Overall WFL for tasks assessed    Lower Extremity Assessment Lower Extremity Assessment: RLE deficits/detail RLE Deficits / Details: Unable to fully assess RLE secondary to increased pain with attempts. Functionally strong       Communication   Communication: No difficulties  Cognition Arousal/Alertness: Awake/alert Behavior During Therapy: WFL for tasks  assessed/performed Overall Cognitive Status: Within Functional Limits for tasks assessed                                        General Comments      Exercises     Assessment/Plan    PT Assessment Patient needs continued PT services  PT Problem List Decreased strength;Decreased activity tolerance;Decreased balance       PT Treatment Interventions Gait training;Functional mobility training;Neuromuscular re-education;Balance training;Therapeutic activities;Therapeutic exercise;Patient/family education;DME instruction    PT Goals (Current goals can be found in the Care Plan section)  Acute Rehab PT Goals Patient Stated Goal: Return to prior function at home PT Goal Formulation: With patient Time For Goal Achievement: 03/29/17 Potential to Achieve Goals: Good    Frequency Min 2X/week   Barriers to discharge        Co-evaluation               AM-PAC PT "6 Clicks" Daily Activity  Outcome Measure Difficulty turning over in bed (including adjusting bedclothes, sheets and blankets)?: None Difficulty moving from lying on back to sitting on the side of the bed? : None Difficulty sitting down on and standing up from a chair with arms (e.g., wheelchair, bedside commode, etc,.)?: None Help needed moving to and from a bed to chair (including a wheelchair)?: None Help needed walking in hospital room?: A Little Help needed climbing 3-5 steps with a  railing? : A Little 6 Click Score: 22    End of Session Equipment Utilized During Treatment: Gait belt Activity Tolerance: Patient tolerated treatment well Patient left: in bed;with call bell/phone within reach;Other (comment) (Sitting upright at EOB, fall score of 6, alarm off)   PT Visit Diagnosis: Difficulty in walking, not elsewhere classified (R26.2);Muscle weakness (generalized) (M62.81)    Time: 1610-9604 PT Time Calculation (min) (ACUTE ONLY): 16 min   Charges:   PT Evaluation $PT Eval Moderate  Complexity: 1 Mod     PT G Codes:   PT G-Codes **NOT FOR INPATIENT CLASS** Functional Assessment Tool Used: AM-PAC 6 Clicks Basic Mobility Functional Limitation: Mobility: Walking and moving around Mobility: Walking and Moving Around Current Status (V4098): At least 20 percent but less than 40 percent impaired, limited or restricted Mobility: Walking and Moving Around Goal Status [phone]): At least 1 percent but less than 20 percent impaired, limited or restricted    Lynnea Maizes PT, DPT    Gurtha Picker 03/15/2017, 10:57 AM

## 2017-03-15 NOTE — Progress Notes (Signed)
Patient alert and oriented. Denies pain. Bruising on arms and abdomen. Vital signs stable. Oriented to call bell.   Harvie HeckMelanie Jontae Sonier, RN

## 2017-03-15 NOTE — Consult Note (Signed)
   [facility]   03/15/2017  Robina Adeommy L Moultry 09-May-1956 [id number]   Referral received by inpatient case manager for [facility] Care Management services and post hospital discharge follow up related to a diagnosis of polypharmacy, multiple admits in 6 months, and COPD. Patient was evaluated for community based chronic disease management services with [facility] as a benefit of patient's [employer]. Called into patient room per inpatient care manager's request to explain [facility] services. Patient endorses his primary care provider to be Vickii PennaFazia Khan, MD.  Patient states he takes over 18 medications and receives these medications form 3 different pharmacies. He stated he organizes his own medications in a pill box designed to hold meds for 7 days at a time. Patient requested more information on how to obtain a service like Med alert and also a pulse oximeter. This liaison made patient aware she would speak to inpatient case manager about this for him. Patient stated he used public transportation for MD visits and his sister assisted him when he needed further rides. Verbal Consent received from [facility] for [facility]. Patient gave [phone] as the best number to reach him. Patient will receive post hospital discharge calls and be evaluated for monthly home visits. Patient will also be contacted by [facility].  [facility] does not interfere with or replace any services arranged by the inpatient care management team.  Made inpatient RNCM aware that [facility] for [facility] management and patient would like further info on med alert and obtaining pulse oximeter. For additional questions please contact:   Reginald Weida RN, BSN [facility]  Hospital Liaison  [phone]) Business Mobile [phone] Toll free office

## 2017-03-15 NOTE — Discharge Summary (Signed)
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 Anemia   . Asthma   . Cataracts, bilateral    worse in Rt eye  . Collagen vascular disease (HCC)   . COPD (chronic obstructive pulmonary disease) (HCC)   . Coronary artery disease   . Depression   . DVT (deep venous thrombosis) (HCC)   . Dysrhythmia   . Emphysema/COPD (HCC)   . Environmental and seasonal allergies   . H/O blood clots   . Heart murmur   . HIV (human immunodeficiency virus infection) (HCC)   . Hypertension   . Leaky heart valve    x 3  . Lung mass   . Myocardial infarction (HCC)    in 2000  . Pneumonia    year ago  . Pulmonary emboli (HCC)   . Type 2 diabetes mellitus [facility])     HOSPITAL COURSE:   HISTORY OF PRESENT ILLNESS: Brian Hampton  is a 61 y.o. male with a known history of Anemia, bronchial asthma, collagen vascular disease, COPD, coronary artery disease, recurrent DVT, hypertension, HIV infection, pulmonary embolism who failed therapy on liquids and Coumadin currently on subcutaneous Lovenox anticoagulation resented to the emergency room with right leg pain. He has increased swelling in the right lower extremity and increased pain. The pain is aching in nature 6 out of 10 scale of 1-10. Patient was evaluated in the emergency room. He was worked up with a vascular Doppler ultrasound of the right lower extremity which showed increased progression in the deep venous thrombosis in the  right lower extremity. Patient was started on IV heparin drip case was discussed with hematology attending who recommended admission and continued heparin drip. Hospitalist service was consulted.  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 Donneta Romberg  2.Right lower extremity weakness ruled out CVA  CT head and CT angiogram of the head are negative  Carotid Dopplers with minimal stenosis  2-D echocardiogram -study done not read by cardiology Dr. Welton Flakes follow-up with cardiology outpatient for the results, Neuro checks -no deficits MRI of the brain -negative  Neurology consulted , appreciate their recommendations  Serum uric acid level is normal Home health PT  3. AECOPD Clinically improved IV Solu-Medrol and nebulizer treatments-changed to by mouth prednisone Chest x-ray negative  4. chest pain with a history of Coronary artery disease Chest pain is most likely noncardiac from cardiology standpoint and is scheduled for nuclear stress test on Thursday at Dr. Milta Deiters office Chest x-ray negative.  PPI twice a day Continue Coreg, Lipitor and  home medication Lasix   5. Hypokalemia replete as needed and recheck  D/C PT HOME today     DISCHARGE CONDITIONS:   fair  CONSULTS OBTAINED:  Treatment Team:  Creig Hinesao, Archana C, MD Laurier NancyKhan, Shaukat A, MD Pauletta BrownsZeylikman, Yuriy, MD   PROCEDURES  None   DRUG ALLERGIES:   Allergies  Allergen Reactions  . Aspirin Anaphylaxis  . Bee Venom Anaphylaxis  . Penicillins  Anaphylaxis and Other (See Comments)    Has patient had a PCN reaction causing immediate rash, facial/tongue/throat swelling, SOB or lightheadedness with hypotension: Yes Has patient had a PCN reaction causing severe rash involving mucus membranes or skin necrosis: No Has patient had a PCN reaction that required hospitalization No Has patient had a PCN reaction occurring within the last 10 years: Yes If all of the above answers are "NO", then may proceed with Cephalosporin use.  . Prednisone Anaphylaxis  . Sulfa Antibiotics Anaphylaxis  . Sulfasalazine Anaphylaxis  . Theophyllines Anaphylaxis  . Theophylline Swelling    DISCHARGE MEDICATIONS:   Current Discharge Medication List    START taking these medications   Details  acetaminophen (TYLENOL) 325 MG tablet Take 1 tablet (325 mg total) by mouth every 6 (six) hours as needed for mild pain (or Fever >/= 101).    fondaparinux (ARIXTRA) 7.5 MG/0.6ML SOLN injection Inject 0.6 mLs (7.5 mg total) into the skin daily. Qty: 30 Syringe, Refills: 0    guaiFENesin (MUCINEX) 600 MG 12 hr tablet Take 1 tablet (600 mg total) by mouth 2 (two) times daily.      CONTINUE these medications which have CHANGED   Details  HYDROcodone-acetaminophen (NORCO) 5-325 MG tablet Take 1 tablet by mouth every 4 (four) hours as needed for moderate pain. Qty: 15 tablet, Refills: 0      CONTINUE these medications which have NOT CHANGED   Details  abacavir-dolutegravir-lamiVUDine (TRIUMEQ) 600-50-300 MG tablet Take 1 tablet by mouth daily after breakfast. Reported on 01/30/2016    albuterol (PROVENTIL HFA;VENTOLIN HFA) 108 (90 BASE) MCG/ACT inhaler Inhale 2 puffs into the lungs every 6 (six) hours as needed for wheezing or shortness of breath.    albuterol (PROVENTIL) (2.5 MG/3ML) 0.083% nebulizer solution Take 2.5 mg by nebulization 3 (three) times daily.     atorvastatin (LIPITOR) 10 MG tablet Take 10 mg by mouth daily after breakfast.    Calcium  Carbonate-Vitamin D (CALCIUM 600+D) 600-400 MG-UNIT tablet Take 1 tablet by mouth daily after breakfast.     carvedilol (COREG) 12.5 MG tablet Take 12.5 mg by mouth 2 (two) times daily with a meal.    cetirizine (ZYRTEC) 10 MG tablet Take 10 mg by mouth daily as needed for allergies.     EPINEPHrine (EPIPEN 2-PAK) 0.3 mg/0.3 mL IJ SOAJ injection Inject 0.3 mg into the muscle once as needed (for severe allergic reaction).    escitalopram (LEXAPRO) 20 MG tablet Take 20 mg by mouth daily after breakfast.     furosemide (LASIX) 20 MG tablet Take 20 mg by mouth daily.     ketorolac (ACULAR) 0.5 % ophthalmic solution Place 1 drop into the left eye 4 (four) times daily.    levothyroxine (SYNTHROID, LEVOTHROID) 50 MCG tablet Take 1 tablet (50 mcg total) by mouth daily before breakfast. Qty: 30 tablet, Refills: 0    LINZESS 72 MCG capsule Take 72 mcg by mouth daily after breakfast.    losartan (COZAAR) 25 MG tablet Take 25 mg by mouth every morning. Refills: 0    magnesium  oxide (MAG-OX) 400 MG tablet Take 400 mg by mouth daily after breakfast.    mirtazapine (REMERON) 15 MG tablet Take 15 mg by mouth every evening.     mometasone-formoterol (DULERA) 200-5 MCG/ACT AERO Inhale 2 puffs into the lungs 3 (three) times daily.     Multiple Vitamin (MULTIVITAMIN WITH MINERALS) TABS tablet Take 1 tablet by mouth daily. ONE-A-DAY MULTIVITAMIN 50+    nitroGLYCERIN (NITROSTAT) 0.4 MG SL tablet Place 0.4 mg under the tongue every 5 (five) minutes as needed for chest pain.    ofloxacin (OCUFLOX) 0.3 % ophthalmic solution Place 1 drop into the left eye 4 (four) times daily.    omeprazole (PRILOSEC) 40 MG capsule Take 40 mg by mouth daily after breakfast.    Potassium Chloride CR (MICRO-K) 8 MEQ CPCR capsule CR Take 1 capsule by mouth daily after breakfast.    tiotropium (SPIRIVA) 18 MCG inhalation capsule Place 18 mcg into inhaler and inhale daily after breakfast.     zolpidem (AMBIEN) 5 MG tablet  Take 5 mg by mouth at bedtime.    senna-docusate (SENOKOT-S) 8.6-50 MG tablet Take 1 tablet by mouth at bedtime as needed for mild constipation. Qty: 30 tablet, Refills: 3      STOP taking these medications     enoxaparin (LOVENOX) 80 MG/0.8ML injection      meloxicam (MOBIC) 7.5 MG tablet      traMADol (ULTRAM) 50 MG tablet          DISCHARGE INSTRUCTIONS:   Follow-up with primary care physician in 5-7 days Follow-up with oncology Dr. Holly Bodily in a week Follow-up with cardiology Dr. Welton Flakes on Thursday, August 9 as scheduled  DIET:  Cardiac diet  DISCHARGE CONDITION:  Fair  ACTIVITY:  Activity as tolerated with HOME HEALTH  OXYGEN:  Home Oxygen: No.   Oxygen Delivery: room air  DISCHARGE LOCATION:  home   If you experience worsening of your admission symptoms, develop shortness of breath, life threatening emergency, suicidal or homicidal thoughts you must seek medical attention immediately by calling 911 or calling your MD immediately  if symptoms less severe.  You Must read complete instructions/literature along with all the possible adverse reactions/side effects for all the Medicines you take and that have been prescribed to you. Take any new Medicines after you have completely understood and accpet all the possible adverse reactions/side effects.   Please note  You were cared for by a hospitalist during your hospital stay. If you have any questions about your discharge medications or the care you received while you were in the hospital after you are discharged, you can call the unit and asked to speak with the hospitalist on call if the hospitalist that took care of you is not available. Once you are discharged, your primary care physician will handle any further medical issues. Please note that NO REFILLS for any discharge medications will be authorized once you are discharged, as it is imperative that you return to your primary care physician (or establish a  relationship with a primary care physician if you do not have one) for your aftercare needs so that they can reassess your need for medications and monitor your lab values.     Today  Chief Complaint  Patient presents with  . Leg Pain   Patient is doing fine. Denies any chest pain or shortness of breath. Moving his right leg and right hand, stroke ruled out okay to discharge patient from oncology, neurology and cardiology standpoint  ROS:  CONSTITUTIONAL: Denies fevers, chills. Denies any fatigue, weakness.  EYES: Denies blurry vision, double vision, eye pain. EARS, NOSE, THROAT: Denies tinnitus, ear pain, hearing loss. RESPIRATORY: Denies cough, wheeze, shortness of breath.  CARDIOVASCULAR: Denies chest pain, palpitations, edema.  GASTROINTESTINAL: Denies nausea, vomiting, diarrhea, abdominal pain. Denies bright red blood per rectum. GENITOURINARY: Denies dysuria, hematuria. ENDOCRINE: Denies nocturia or thyroid problems. HEMATOLOGIC AND LYMPHATIC: Denies easy bruising or bleeding. SKIN: Denies rash or lesion. MUSCULOSKELETAL: Denies pain in neck, back, shoulder, knees, hips or arthritic symptoms. Right leg weakness is improving  NEUROLOGIC: Denies paralysis, paresthesias.  PSYCHIATRIC: Denies anxiety or depressive symptoms.   VITAL SIGNS:  Blood pressure 129/88, pulse 76, temperature 98 F (36.7 C), temperature source Oral, resp. rate 16, height 5\' 5"  (1.651 m), weight 87.1 kg (192 lb), SpO2 96 %.  I/O:    Intake/Output Summary (Last 24 hours) at 03/15/17 1404 Last data filed at 03/15/17 1300  Gross per 24 hour  Intake           880.83 ml  Output              800 ml  Net            80.83 ml    PHYSICAL EXAMINATION:  GENERAL:  61 y.o.-year-old patient lying in the bed with no acute distress.  EYES: Pupils equal, round, reactive to light and accommodation. No scleral icterus. Extraocular muscles intact.  HEENT: Head atraumatic, normocephalic. Oropharynx and nasopharynx  clear.  NECK:  Supple, no jugular venous distention. No thyroid enlargement, no tenderness.  LUNGS: Normal breath sounds bilaterally, no wheezing, rales,rhonchi or crepitation. No use of accessory muscles of respiration.  CARDIOVASCULAR: S1, S2 normal. No murmurs, rubs, or gallops.  ABDOMEN: Soft, non-tender, non-distended. Bowel sounds present. No organomegaly or mass.  EXTREMITIES: No pedal edema, cyanosis, or clubbing.  NEUROLOGIC: Cranial nerves II through XII are intact. Right lower extremity weakness is improving . Sensory intact. Gait not checked.  PSYCHIATRIC: The patient is alert and oriented x 3.  SKIN: No obvious rash, lesion, or ulcer.   DATA REVIEW:   CBC  Recent Labs Lab 03/15/17 0407  WBC 11.0*  HGB 11.6*  HCT 33.9*  PLT 299    Chemistries   Recent Labs Lab 03/13/17 2059 03/15/17 0407  NA  --  143  K  --  4.1  CL  --  108  CO2  --  27  GLUCOSE  --  137*  BUN  --  30*  CREATININE  --  1.22  CALCIUM  --  9.0  MG 1.8  --     Cardiac Enzymes  Recent Labs Lab 03/12/17 0321  TROPONINI <0.03    Microbiology Results  Results for orders placed or performed during the hospital encounter of 12/21/16  Gastrointestinal Panel by PCR , Stool     Status: None   Collection Time: 12/22/16  3:41 PM  Result Value Ref Range Status   Campylobacter species NOT DETECTED NOT DETECTED Final   Plesimonas shigelloides NOT DETECTED NOT DETECTED Final   Salmonella species NOT DETECTED NOT DETECTED Final   Yersinia enterocolitica NOT DETECTED NOT DETECTED Final   Vibrio species NOT DETECTED NOT DETECTED Final   Vibrio cholerae NOT DETECTED NOT DETECTED Final   Enteroaggregative E coli (EAEC) NOT DETECTED NOT DETECTED Final   Enteropathogenic E coli (EPEC) NOT DETECTED NOT DETECTED Final   Enterotoxigenic E coli (ETEC) NOT DETECTED NOT DETECTED Final   Shiga like toxin producing E  coli (STEC) NOT DETECTED NOT DETECTED Final   Shigella/Enteroinvasive E coli (EIEC) NOT  DETECTED NOT DETECTED Final   Cryptosporidium NOT DETECTED NOT DETECTED Final   Cyclospora cayetanensis NOT DETECTED NOT DETECTED Final   Entamoeba histolytica NOT DETECTED NOT DETECTED Final   Giardia lamblia NOT DETECTED NOT DETECTED Final   Adenovirus F40/41 NOT DETECTED NOT DETECTED Final   Astrovirus NOT DETECTED NOT DETECTED Final   Norovirus GI/GII NOT DETECTED NOT DETECTED Final   Rotavirus A NOT DETECTED NOT DETECTED Final   Sapovirus (I, II, IV, and V) NOT DETECTED NOT DETECTED Final  C difficile quick scan w PCR reflex     Status: None   Collection Time: 12/22/16  3:41 PM  Result Value Ref Range Status   C Diff antigen NEGATIVE NEGATIVE Final   C Diff toxin NEGATIVE NEGATIVE Final   C Diff interpretation No C. difficile detected.  Final    RADIOLOGY:  Ct Angio Head W Or Wo Contrast  Result Date: 03/13/2017 CLINICAL DATA:  61 y/o  M; 1 hour of right-sided numbness. EXAM: CT ANGIOGRAPHY HEAD TECHNIQUE: Multidetector CT imaging of the head was performed using the standard protocol during bolus administration of intravenous contrast. Multiplanar CT image reconstructions and MIPs were obtained to evaluate the vascular anatomy. CONTRAST:  75 cc Isovue 370 COMPARISON:  01/30/2016 MRI of the head.  03/13/2017 CT of the head. FINDINGS: CTA HEAD Anterior circulation: No significant stenosis, proximal occlusion, aneurysm, or vascular malformation. Posterior circulation: No significant stenosis, proximal occlusion, aneurysm, or vascular malformation. Venous sinuses: As permitted by contrast timing, patent. Anatomic variants: Bilateral fetal PCA. Small caliber vertebrobasilar system with similar small flow voids on the prior MRI of the brain, likely anatomic variant due to bilateral fetal PCA. Delayed phase: No abnormal intracranial enhancement. IMPRESSION: Patent circle of Willis. No large vessel occlusion, aneurysm, or significant stenosis. Variant anatomy with bilateral fetal PCA and small  caliber vertebrobasilar system. Electronically Signed   By: [name] M.D.   On: 03/13/2017 16:38   Dg Chest 2 View  Result Date: 03/13/2017 CLINICAL DATA:  Former smoker hx of COPD, asthma, CAD admitted with multiple blood clots (per patient). EXAM: CHEST  2 VIEW COMPARISON:  Chest x-ray dated 11/26/2016. Chest CT dated 03/03/2017. FINDINGS: Heart size and mediastinal contours are within normal limits. Lungs appear clear. No pleural effusion or pneumothorax seen. Stable mild compression deformity of a mid thoracic vertebral body. No acute or suspicious osseous finding seen. IVC filter noted in the upper abdomen. IMPRESSION: No active cardiopulmonary disease. No evidence of pneumonia or pulmonary edema. Electronically Signed   By: Bary Richard M.D.   On: 03/13/2017 11:27   Ct Head Wo Contrast  Result Date: 03/13/2017 CLINICAL DATA:  Right sided numbness x 1 hour ago EXAM: CT HEAD WITHOUT CONTRAST TECHNIQUE: Contiguous axial images were obtained from the base of the skull through the vertex without intravenous contrast. COMPARISON:  02/01/2016 FINDINGS: Brain: There is mild central and cortical atrophy. Mild periventricular white matter changes are consistent with small vessel disease. There is no intra or extra-axial fluid collection or mass lesion. The basilar cisterns and ventricles have a normal appearance. There is no CT evidence for acute infarction or hemorrhage. Vascular: No hyperdense vessel or unexpected calcification. Skull: No acute abnormality. Sinuses/Orbits: No acute finding. Other: None IMPRESSION: 1.  No evidence for acute  abnormality. 2. Mild atrophy and small vessel disease. Electronically Signed   By: Norva Pavlov M.D.   On: 03/13/2017  16:03   Mr Shirlee Latch RU Contrast  Result Date: 03/14/2017 CLINICAL DATA:  61 y/o M; increasing weakness in the right upper and lower extremities. EXAM: MRI HEAD WITHOUT CONTRAST MRA HEAD WITHOUT CONTRAST TECHNIQUE: Multiplanar, multiecho  pulse sequences of the brain and surrounding structures were obtained without intravenous contrast. Angiographic images of the head were obtained using MRA technique without contrast. COMPARISON:  03/13/2017 CT angiogram of the head. FINDINGS: MRI HEAD FINDINGS Brain: No acute infarction, hemorrhage, hydrocephalus, extra-axial collection or mass lesion. Several nonspecific foci of T2 FLAIR hyperintense signal abnormality in subcortical and periventricular white matter is compatible with moderate chronic microvascular ischemic changes for age. Mild brain parenchymal volume loss. Vascular: As below. Skull and upper cervical spine: Mild cranial settling likely related to history of collagen vascular disease, no significant stenosis of the foramen magnum. Right frontal bone T1 hyperintense focus compatible with hemangioma. Sinuses/Orbits: Post bilateral maxillary antrostomy and ethmoidectomy. No significant paranasal sinus mucosal thickening. No abnormal signal of mastoid air cells. Bilateral intra-ocular lens replacement. Other: None. MRA HEAD FINDINGS Internal carotid arteries:  Patent. Anterior cerebral arteries:  Patent. Middle cerebral arteries: Patent. Anterior communicating artery: Motion degraded. Posterior communicating arteries:  Patent. Posterior cerebral arteries:  Patent.  Bilateral fetal PCA. Basilar artery:  Patent.  Small in caliber. Vertebral arteries:  Patent.  Small in caliber. Motion degraded study, suboptimal assessment for subtle stenosis or small 2-3 mm aneurysm. IMPRESSION: MRI head: 1. No acute intracranial abnormality. 2. Moderate for age chronic microvascular ischemic changes and mild parenchymal volume loss of the brain. 3. Cranial settling likely related to collagen vascular disease, no significant stenosis of foramen magnum. MRA head: 1. Motion degraded study, suboptimal assessment for subtle stenosis or small aneurysm. 2. Patent circle of Willis. No large vessel occlusion, aneurysm, or  significant stenosis is identified. Electronically Signed   By: [name] M.D.   On: 03/14/2017 16:11   Mr Brain Wo Contrast  Result Date: 03/14/2017 CLINICAL DATA:  61 y/o M; increasing weakness in the right upper and lower extremities. EXAM: MRI HEAD WITHOUT CONTRAST MRA HEAD WITHOUT CONTRAST TECHNIQUE: Multiplanar, multiecho pulse sequences of the brain and surrounding structures were obtained without intravenous contrast. Angiographic images of the head were obtained using MRA technique without contrast. COMPARISON:  03/13/2017 CT angiogram of the head. FINDINGS: MRI HEAD FINDINGS Brain: No acute infarction, hemorrhage, hydrocephalus, extra-axial collection or mass lesion. Several nonspecific foci of T2 FLAIR hyperintense signal abnormality in subcortical and periventricular white matter is compatible with moderate chronic microvascular ischemic changes for age. Mild brain parenchymal volume loss. Vascular: As below. Skull and upper cervical spine: Mild cranial settling likely related to history of collagen vascular disease, no significant stenosis of the foramen magnum. Right frontal bone T1 hyperintense focus compatible with hemangioma. Sinuses/Orbits: Post bilateral maxillary antrostomy and ethmoidectomy. No significant paranasal sinus mucosal thickening. No abnormal signal of mastoid air cells. Bilateral intra-ocular lens replacement. Other: None. MRA HEAD FINDINGS Internal carotid arteries:  Patent. Anterior cerebral arteries:  Patent. Middle cerebral arteries: Patent. Anterior communicating artery: Motion degraded. Posterior communicating arteries:  Patent. Posterior cerebral arteries:  Patent.  Bilateral fetal PCA. Basilar artery:  Patent.  Small in caliber. Vertebral arteries:  Patent.  Small in caliber. Motion degraded study, suboptimal assessment for subtle stenosis or small 2-3 mm aneurysm. IMPRESSION: MRI head: 1. No acute intracranial abnormality. 2. Moderate for age chronic  microvascular ischemic changes and mild parenchymal volume loss of the brain. 3. Cranial settling likely related to collagen  vascular disease, no significant stenosis of foramen magnum. MRA head: 1. Motion degraded study, suboptimal assessment for subtle stenosis or small aneurysm. 2. Patent circle of Willis. No large vessel occlusion, aneurysm, or significant stenosis is identified. Electronically Signed   By: [name] M.D.   On: 03/14/2017 16:11   US Abdomen Complete  Result Date: 03/13/2017 CLINICAL DATA:  Nonspecific abdominal pain beginning at 1 p.m. EXAM: ABDOMEN ULTRASOUND COMPLETE COMPARISON:  CT abdomen and pelvis May 05, 2017 FINDINGS: Limited examination due to body habitus and, bowel gas. Gallbladder: Contracted resulting in gallbladder wall thickening. No cholelithiasis. No sonographic Murphy sign noted by sonographer. Common bile duct: Diameter: 3 mm. Liver: No focal lesion identified. Within normal limits in parenchymal echogenicity. Hepatopetal portal vein. IVC: No abnormality visualized. Pancreas: Predominately obscured. Spleen: Size and appearance within normal limits. Right Kidney: Length: 9.7 cm. Echogenicity within normal limits. No mass or hydronephrosis visualized. Left Kidney: Length: 10.5 cm. Echogenicity within normal limits. No mass or hydronephrosis visualized. Abdominal aorta: No aneurysm visualized.  Distal aorta obscured. Other findings: None. IMPRESSION: No acute abdominal process. Electronically Signed   By: Awilda Metro M.D.   On: 03/13/2017 20:32   US Carotid Bilateral  Result Date: 03/14/2017 CLINICAL DATA:  61 year old male with symptoms of transient ischemic attack EXAM: BILATERAL CAROTID DUPLEX ULTRASOUND TECHNIQUE: Wallace Cullens scale imaging, color Doppler and duplex ultrasound were performed of bilateral carotid and vertebral arteries in the neck. COMPARISON:  None. FINDINGS: Criteria: Quantification of carotid stenosis is based on velocity parameters  that correlate the residual internal carotid diameter with NASCET-based stenosis levels, using the diameter of the distal internal carotid lumen as the denominator for stenosis measurement. The following velocity measurements were obtained: RIGHT ICA:  101/24 cm/sec CCA:  100/15 cm/sec SYSTOLIC ICA/CCA RATIO:  1.0 DIASTOLIC ICA/CCA RATIO:  1.6 ECA:  131 cm/sec LEFT ICA:  69/19 cm/sec CCA:  126/23 cm/sec SYSTOLIC ICA/CCA RATIO:  0.6 DIASTOLIC ICA/CCA RATIO:  0.8 ECA:  100 cm/sec RIGHT CAROTID ARTERY: Smooth heterogeneous atherosclerotic plaque in the proximal internal carotid artery. By peak systolic velocity criteria, the estimated stenosis remains less than 50%. RIGHT VERTEBRAL ARTERY:  Patent with antegrade flow. LEFT CAROTID ARTERY: No focal atherosclerotic plaque or evidence of stenosis. LEFT VERTEBRAL ARTERY:  Patent with normal antegrade flow. IMPRESSION: 1. Mild (1-49%) stenosis proximal right internal carotid artery secondary to smooth heterogeneous atherosclerotic plaque. 2. No significant atherosclerotic plaque or evidence of stenosis in the left internal carotid artery. 3. Vertebral arteries are patent with normal antegrade flow. Signed, Sterling Big, MD [facility] Electronically Signed   By: Malachy Moan M.D.   On: 03/14/2017 15:08   US Venous Img Lower Bilateral  Result Date: 03/12/2017 CLINICAL DATA:  Hospitalized 2 weeks ago for right leg DVT. Assess deep venous thrombosis. Initial encounter. EXAM: BILATERAL LOWER EXTREMITY VENOUS DOPPLER ULTRASOUND TECHNIQUE: Gray-scale sonography with graded compression, as well as color Doppler and duplex ultrasound were performed to evaluate the lower extremity deep venous systems from the level of the common femoral vein and including the common femoral, femoral, profunda femoral, popliteal and calf veins including the posterior tibial, peroneal and gastrocnemius veins when visible. The  superficial great saphenous vein was also interrogated. Spectral Doppler was utilized to evaluate flow at rest and with distal augmentation maneuvers in the common femoral, femoral and popliteal veins. COMPARISON:  Right lower extremity venous Doppler ultrasound performed 02/25/2017 FINDINGS: RIGHT LOWER EXTREMITY Common Femoral Vein: No evidence of thrombus.  Normal compressibility, respiratory phasicity and response to augmentation. Saphenofemoral Junction: No evidence of thrombus. Normal compressibility and flow on color Doppler imaging. Profunda Femoral Vein: Nonocclusive thrombus is noted within the profunda femoral vein, new from the prior study. Femoral Vein: Occlusive thrombus is noted within the femoral vein, similar to the prior study. Popliteal Vein: Occlusive thrombus noted within the popliteal vein. Calf Veins: Nonocclusive thrombus is noted within the posterior tibial vein. The peroneal vein is patent, new from the prior study. Superficial Great Saphenous Vein: No evidence of thrombus. Normal compressibility and flow on color Doppler imaging. Venous Reflux:  None. Other Findings:  None. LEFT LOWER EXTREMITY Common Femoral Vein: No evidence of thrombus. Normal compressibility, respiratory phasicity and response to augmentation. Saphenofemoral Junction: No evidence of thrombus. Normal compressibility and flow on color Doppler imaging. Profunda Femoral Vein: No evidence of thrombus. Normal compressibility and flow on color Doppler imaging. Femoral Vein: No evidence of thrombus. Normal compressibility, respiratory phasicity and response to augmentation. Popliteal Vein: No evidence of thrombus. Normal compressibility, respiratory phasicity and response to augmentation. Calf Veins: No evidence of thrombus. Normal compressibility and flow on color Doppler imaging. Superficial Great Saphenous Vein: No evidence of thrombus. Normal compressibility and flow on color Doppler imaging. Venous Reflux:  None. Other  Findings:  None. IMPRESSION: 1. Deep venous thrombosis noted within the right profunda femoral vein, femoral vein, popliteal vein and posterior tibial vein, with occlusion at the mid to distal femoral vein and proximal popliteal vein. This has progressed slightly proximally since the prior study. 2. No evidence of DVT at the left lower extremity. These results were called by telephone at the time of interpretation on 03/12/2017 at 5:40 am to Dr. Merrily Brittle, who verbally acknowledged these results. Electronically Signed   By: Roanna Raider M.D.   On: 03/12/2017 05:42    EKG:   Orders placed or performed during the hospital encounter of 03/12/17  . ED EKG within 10 minutes  . ED EKG within 10 minutes      Management plans discussed with the patient, family and they are in agreement.  CODE STATUS:     Code Status Orders        Start     Ordered   03/12/17 0824  Full code  Continuous     03/12/17 0823    Code Status History    Date Active Date Inactive Code Status Order ID Comments User Context   03/04/2017  4:05 AM 03/05/2017  6:33 PM Full Code [id number]  Arnaldo Natal, MD Inpatient   02/25/2017 11:52 PM 02/27/2017  6:06 PM DNR [id number]  Tonye Royalty, DO Inpatient   02/25/2017 10:27 PM 02/25/2017 11:52 PM Full Code [id number]  Tonye Royalty, DO Inpatient   12/21/2016  6:14 PM 12/24/2016  3:03 PM DNR [id number]  Kennedy Bucker, MD Inpatient   05/05/2016 10:17 PM 05/12/2016  6:20 PM DNR [id number]  Altamese Dilling, MD Inpatient   03/15/2016  5:42 PM 03/17/2016  6:59 PM DNR [id number]  Auburn Bilberry, MD ED   03/15/2016  5:29 PM 03/15/2016  5:42 PM Full Code [id number]  Auburn Bilberry, MD ED   01/30/2016 12:55 AM 02/03/2016 12:55 AM DNR [id number]  Gery Pray, MD Inpatient   01/13/2016  9:46 PM 01/16/2016  4:18 PM Full Code [id number]  Shaune Pollack, MD Inpatient   06/24/2015  7:13 AM 06/25/2015 12:27 PM Full Code [id number]  Milagros Loll, MD ED    Advance Directive Documentation      Most Recent  Value  Type of Advance Directive  Living will  Pre-existing out of facility DNR order (yellow form or pink MOST form)  -  "MOST" Form in Place?  -      TOTAL TIME TAKING CARE OF THIS PATIENT: 45  minutes.   Note: This dictation was prepared with Dragon dictation along with smaller phrase technology. Any transcriptional errors that result from this process are unintentional.   @[facility] @  on 03/15/2017 at 2:04 PM  Between 7am to 6pm - Pager - [phone]  After 6pm go to [email or web address] - password [email or web address] [facility]  [place] [place] [facility]  Office  [phone]  CC: Primary care physician; Mick Sell, MD

## 2017-03-15 NOTE — Care Management Important Message (Signed)
Important Message  Patient Details  Name: Brian Hampton MRN: [id number] Date of Birth: May 18, 1956   Medicare Important Message Given:  Yes    Marily MemosLisa M Guila Owensby, RN 03/15/2017, 1:50 PM

## 2017-03-15 NOTE — Care Management Note (Addendum)
Case Management Note  Patient Details  Name: Robina Adeommy L [place] MRN: [id number] Date of Birth: 03-11-1956  Subjective/Objective:  PT recommending home health PT. Patient active with [facility] for SN and SW. Have notified [facility] of admission and need for PT at DC. Spoke with patient and he is agreeable to POC.  Lives in a boarding house. PCP is Dr. Sampson GoonFitzgerald. [facility]                 Action/Plan:   Expected Discharge Date:                  Expected Discharge Plan:  Home w Home Health Services  In-House Referral:     Discharge planning Services  CM Consult  Post Acute Care Choice:  Resumption of Svcs/PTA Provider Choice offered to:  Patient  DME Arranged:    DME Agency:     HH Arranged:  RN, PT, SW HH Agency: [facility]  Status of Service:  In process, will continue to follow  If discussed at Long Length of Stay Meetings, dates discussed:    Additional Comments:  Marily MemosLisa M Jonty Morrical, RN 03/15/2017, 11:10 AM

## 2017-03-16 ENCOUNTER — Other Ambulatory Visit: Payer: Self-pay | Admitting: Pharmacist

## 2017-03-16 DIAGNOSIS — I82401 Acute embolism and thrombosis of unspecified deep veins of right lower extremity: Secondary | ICD-10-CM | POA: Diagnosis not present

## 2017-03-16 DIAGNOSIS — I129 Hypertensive chronic kidney disease with stage 1 through stage 4 chronic kidney disease, or unspecified chronic kidney disease: Secondary | ICD-10-CM | POA: Diagnosis not present

## 2017-03-16 DIAGNOSIS — I2510 Atherosclerotic heart disease of native coronary artery without angina pectoris: Secondary | ICD-10-CM | POA: Diagnosis not present

## 2017-03-16 DIAGNOSIS — E1122 Type 2 diabetes mellitus with diabetic chronic kidney disease: Secondary | ICD-10-CM | POA: Diagnosis not present

## 2017-03-16 DIAGNOSIS — J439 Emphysema, unspecified: Secondary | ICD-10-CM | POA: Diagnosis not present

## 2017-03-16 LAB — ECHOCARDIOGRAM COMPLETE
Height: 65 in
Weight: 3072 oz

## 2017-03-16 NOTE — Patient Outreach (Signed)
[facility]) Care Management  [facility]   03/16/2017  Robina Adeommy L Osso 05-Feb-1956 [id number]  61 year old [facility] discharge follow-up and care care coordination.  [facility] services requested for medication management.  PMHx includes, but not limited to, CAD, CHF (EF 55-60% 8/'18), COPD, asthma, depression, hx DVT and PE, anemia, heart murmur, HIV, HTN, type II diabetes, and lung cancer.  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  Note patient also previously failed apixaban and warfarin and was recently changed to Lovenox and has IVC filter in place.    Per AVS, discontinued medications include Lovenox, mobic, and tramadol   Subjective: Successful telephone encounter with patient.  HIPAA identifiers verified. Patient reports he was discharged yesterday from the hospital and is on a new blood thinner.  He states he takes it once a day in the evening and is familiar with how to give himself injections.  Patietn reports that he uses weekly pillboxes for his oral medications.  Patient is agreeable to review all his medications telephonically.  Objective:  03/15/2017 SCr = 1.22, CrCl 79 ml/min Wt = 87.1kg  Encounter Medications: Outpatient Encounter Prescriptions as of 03/16/2017  Medication Sig  . abacavir-dolutegravir-lamiVUDine (TRIUMEQ) 600-50-300 MG tablet Take 1 tablet by mouth daily after breakfast. Reported on 01/30/2016  . acetaminophen (TYLENOL) 325 MG tablet Take 1 tablet (325 mg total) by mouth every 6 (six) hours as needed for mild pain (or Fever >/= 101).  Marland Kitchen. albuterol (PROVENTIL HFA;VENTOLIN HFA) 108 (90 BASE) MCG/ACT inhaler Inhale 2 puffs into the lungs every 6 (six) hours as needed for wheezing or shortness of breath.  Marland Kitchen. albuterol (PROVENTIL) (2.5 MG/3ML) 0.083% nebulizer solution Take 2.5 mg by nebulization 3  (three) times daily.   Marland Kitchen. atorvastatin (LIPITOR) 10 MG tablet Take 10 mg by mouth daily after breakfast.  . Calcium Carbonate-Vitamin D (CALCIUM 600+D) 600-400 MG-UNIT tablet Take 1 tablet by mouth daily after breakfast.   . carvedilol (COREG) 12.5 MG tablet Take 12.5 mg by mouth 2 (two) times daily with a meal.  . cetirizine (ZYRTEC) 10 MG tablet Take 10 mg by mouth daily as needed for allergies.   Marland Kitchen. EPINEPHrine (EPIPEN 2-PAK) 0.3 mg/0.3 mL IJ SOAJ injection Inject 0.3 mg into the muscle once as needed (for severe allergic reaction).  Marland Kitchen. escitalopram (LEXAPRO) 20 MG tablet Take 20 mg by mouth daily after breakfast.   . fondaparinux (ARIXTRA) 7.5 MG/0.6ML SOLN injection Inject 0.6 mLs (7.5 mg total) into the skin daily.  . furosemide (LASIX) 20 MG tablet Take 20 mg by mouth daily.   Marland Kitchen. guaiFENesin (MUCINEX) 600 MG 12 hr tablet Take 1 tablet (600 mg total) by mouth 2 (two) times daily.  Marland Kitchen. HYDROcodone-acetaminophen (NORCO) 5-325 MG tablet Take 1 tablet by mouth every 4 (four) hours as needed for moderate pain.  Marland Kitchen. ketorolac (ACULAR) 0.5 % ophthalmic solution Place 1 drop into the left eye 4 (four) times daily.  Marland Kitchen. levothyroxine (SYNTHROID, LEVOTHROID) 50 MCG tablet Take 1 tablet (50 mcg total) by mouth daily before breakfast.  . LINZESS 72 MCG capsule Take 72 mcg by mouth daily after breakfast.  . losartan (COZAAR) 25 MG tablet Take 25 mg by mouth every morning.  . magnesium oxide (MAG-OX) 400 MG tablet Take 400 mg by mouth daily after breakfast.  . mirtazapine (REMERON) 15 MG tablet Take 15 mg by mouth  every evening.   . mometasone-formoterol (DULERA) 200-5 MCG/ACT AERO Inhale 2 puffs into the lungs 3 (three) times daily.   . Multiple Vitamin (MULTIVITAMIN WITH MINERALS) TABS tablet Take 1 tablet by mouth daily. ONE-A-DAY MULTIVITAMIN 50+  . nitroGLYCERIN (NITROSTAT) 0.4 MG SL tablet Place 0.4 mg under the tongue every 5 (five) minutes as needed for chest pain.  Marland Kitchen ofloxacin (OCUFLOX) 0.3 % ophthalmic  solution Place 1 drop into the left eye 4 (four) times daily.  Marland Kitchen omeprazole (PRILOSEC) 40 MG capsule Take 40 mg by mouth daily after breakfast.  . Potassium Chloride CR (MICRO-K) 8 MEQ CPCR capsule CR Take 1 capsule by mouth daily after breakfast.  . senna-docusate (SENOKOT-S) 8.6-50 MG tablet Take 1 tablet by mouth at bedtime as needed for mild constipation. (Patient not taking: Reported on 11/26/2016)  . tiotropium (SPIRIVA) 18 MCG inhalation capsule Place 18 mcg into inhaler and inhale daily after breakfast.   . zolpidem (AMBIEN) 5 MG tablet Take 5 mg by mouth at bedtime.   No facility-administered encounter medications on file as of 03/16/2017.     Functional Status: In your present state of health, do you have any difficulty performing the following activities: 03/12/2017 03/04/2017  Hearing? N N  Vision? N N  Difficulty concentrating or making decisions? N N  Walking or climbing stairs? N N  Dressing or bathing? N N  Doing errands, shopping? N N  Some recent data might be hidden    Fall/Depression Screening: No flowsheet data found. No flowsheet data found.    Assessment:  Drugs sorted by system:  Neurologic/Psychologic: Escitalopram, mirtazapine, zolpidem  Cardiovascular: Atorvastatin, carvedilol, furosemide, losartan, potassium, PRN NTG  Pulmonary/Allergy: Albuterol, mometasone-formoterol, tiotropium, cetirizine, guaifenesin, epinephrine PRN  Gastrointestinal: Linaclotide, omeprazole   Endocrine: Levothyroxine  Topical: Ketorolac eye drops, ofloxacin eye drops,   Pain: Acetaminophen, hydrocodone-acetaminophen  Vitamins/Minerals: Calcium Carbonate - Vitamin D, magnesium oxide, MVI  Infectious Diseases: Abacavir -dolutegravir -lamivudine  Miscellaneous: Fondaparinux    Patient does not have any questions regarding his medications.  We reviewed his new medication, Arixtra, which is appropriately dosed for his renal function and weight.  We reviewed signs and  symptoms of bleeding.  I discussed that [facility] pharmacy may be able to blister-pack his medications if he is interested in this service.  Patient does not wish for me to look into this for him and states he will call them himself.  Patient reports he has no issues paying for his medications.    Plan: [facility] will close patient's case at this time.  I am happy to assist in the future as needed. I will route my note to patient's PCP and to other [facility] care team member, Jodi Mourning.      Haynes Hoehn, PharmD, [facility] Clinical Pharmacist [facility] [employer] [phone]

## 2017-03-17 ENCOUNTER — Encounter: Payer: Self-pay | Admitting: *Deleted

## 2017-03-17 ENCOUNTER — Other Ambulatory Visit: Payer: Self-pay | Admitting: *Deleted

## 2017-03-17 DIAGNOSIS — I2510 Atherosclerotic heart disease of native coronary artery without angina pectoris: Secondary | ICD-10-CM | POA: Diagnosis not present

## 2017-03-17 DIAGNOSIS — I82401 Acute embolism and thrombosis of unspecified deep veins of right lower extremity: Secondary | ICD-10-CM | POA: Diagnosis not present

## 2017-03-17 DIAGNOSIS — E1122 Type 2 diabetes mellitus with diabetic chronic kidney disease: Secondary | ICD-10-CM | POA: Diagnosis not present

## 2017-03-17 DIAGNOSIS — J439 Emphysema, unspecified: Secondary | ICD-10-CM | POA: Diagnosis not present

## 2017-03-17 DIAGNOSIS — N189 Chronic kidney disease, unspecified: Secondary | ICD-10-CM | POA: Diagnosis not present

## 2017-03-17 DIAGNOSIS — I129 Hypertensive chronic kidney disease with stage 1 through stage 4 chronic kidney disease, or unspecified chronic kidney disease: Secondary | ICD-10-CM | POA: Diagnosis not present

## 2017-03-17 NOTE — Patient Outreach (Signed)
Successful telephone encounter to Brian Hampton, 61 year old male- follow up on referral received 03/15/17 [facility] for [facility] CM [facility]/recent hospitalization August 4-7,2018 for DVT right lower extremity, weakness.  Pt's history includes but not limited to COPD, Hypertension, MI, DM, Hyperthyroidism.  Spoke with pt, HIPAA identifiers verified, discussed purpose of call - follow up on referral for Community CM services.   Pt reports doing a lot better,no pain, they got me lined up with 3 different doctors- Hematologist, Heart, Orthopedic.   Pt reports to see PCP and Eye MD today, Heart/Hematologist/Orthopedic doctors tomorrow.  Pt reports uses [employer] for transportation.   Pt report recent hospitalization found 6 more blood clots, started on a different medication Aristra once a day, no problems administering, was on Coumadin/did not work.  Pt reports he is at risk for broken bones, told not to lift more than 10-15 lbs.  Pt reports takes all of his medications as ordered, does a 7 day pill planner, no problems affording medications. ** Patient was recently discharged from hospital and all medications have been reviewed.   RN CM discussed with pt [facility] transition of care program- follow 31 days (weekly calls, a home visit)- no cost, discussed doing a home visit next week to which pt agreed.   Plan:  As discussed with pt, plan to continue to follow for transition of care, follow up again next week- initial home visit.            Barrier letter faxed to Dr. Clydie Hampton  Fitzgerald informing [facility].   Shayne Alkenose M.   Kloe Oates RN CCM [facility]  [phone]

## 2017-03-18 DIAGNOSIS — I10 Essential (primary) hypertension: Secondary | ICD-10-CM | POA: Diagnosis not present

## 2017-03-18 DIAGNOSIS — I2510 Atherosclerotic heart disease of native coronary artery without angina pectoris: Secondary | ICD-10-CM | POA: Diagnosis not present

## 2017-03-18 DIAGNOSIS — E785 Hyperlipidemia, unspecified: Secondary | ICD-10-CM | POA: Diagnosis not present

## 2017-03-21 DIAGNOSIS — B20 Human immunodeficiency virus [HIV] disease: Secondary | ICD-10-CM | POA: Diagnosis not present

## 2017-03-22 ENCOUNTER — Observation Stay
Admission: EM | Admit: 2017-03-22 | Discharge: 2017-03-23 | Disposition: A | Discharge status: Home / Self Care | Payer: Medicare Other | Attending: Internal Medicine | Admitting: Internal Medicine

## 2017-03-22 ENCOUNTER — Emergency Department: Payer: Medicare Other

## 2017-03-22 ENCOUNTER — Encounter: Payer: Self-pay | Admitting: Emergency Medicine

## 2017-03-22 DIAGNOSIS — E1151 Type 2 diabetes mellitus with diabetic peripheral angiopathy without gangrene: Secondary | ICD-10-CM | POA: Insufficient documentation

## 2017-03-22 DIAGNOSIS — D649 Anemia, unspecified: Secondary | ICD-10-CM | POA: Diagnosis not present

## 2017-03-22 DIAGNOSIS — Z79899 Other long term (current) drug therapy: Secondary | ICD-10-CM | POA: Diagnosis not present

## 2017-03-22 DIAGNOSIS — Z880 Allergy status to penicillin: Secondary | ICD-10-CM | POA: Insufficient documentation

## 2017-03-22 DIAGNOSIS — R079 Chest pain, unspecified: Secondary | ICD-10-CM

## 2017-03-22 DIAGNOSIS — I10 Essential (primary) hypertension: Secondary | ICD-10-CM | POA: Insufficient documentation

## 2017-03-22 DIAGNOSIS — Z87891 Personal history of nicotine dependence: Secondary | ICD-10-CM | POA: Diagnosis not present

## 2017-03-22 DIAGNOSIS — I82409 Acute embolism and thrombosis of unspecified deep veins of unspecified lower extremity: Secondary | ICD-10-CM | POA: Diagnosis not present

## 2017-03-22 DIAGNOSIS — Z86718 Personal history of other venous thrombosis and embolism: Secondary | ICD-10-CM | POA: Insufficient documentation

## 2017-03-22 DIAGNOSIS — I2510 Atherosclerotic heart disease of native coronary artery without angina pectoris: Secondary | ICD-10-CM | POA: Diagnosis not present

## 2017-03-22 DIAGNOSIS — Z86711 Personal history of pulmonary embolism: Secondary | ICD-10-CM | POA: Insufficient documentation

## 2017-03-22 DIAGNOSIS — J449 Chronic obstructive pulmonary disease, unspecified: Secondary | ICD-10-CM | POA: Diagnosis present

## 2017-03-22 DIAGNOSIS — K219 Gastro-esophageal reflux disease without esophagitis: Secondary | ICD-10-CM | POA: Diagnosis present

## 2017-03-22 DIAGNOSIS — Z21 Asymptomatic human immunodeficiency virus [HIV] infection status: Secondary | ICD-10-CM | POA: Diagnosis present

## 2017-03-22 DIAGNOSIS — F329 Major depressive disorder, single episode, unspecified: Secondary | ICD-10-CM | POA: Diagnosis not present

## 2017-03-22 DIAGNOSIS — B20 Human immunodeficiency virus [HIV] disease: Secondary | ICD-10-CM | POA: Diagnosis present

## 2017-03-22 DIAGNOSIS — N433 Hydrocele, unspecified: Secondary | ICD-10-CM | POA: Diagnosis not present

## 2017-03-22 DIAGNOSIS — I252 Old myocardial infarction: Secondary | ICD-10-CM | POA: Insufficient documentation

## 2017-03-22 DIAGNOSIS — M79652 Pain in left thigh: Secondary | ICD-10-CM | POA: Diagnosis not present

## 2017-03-22 DIAGNOSIS — M79662 Pain in left lower leg: Secondary | ICD-10-CM | POA: Diagnosis not present

## 2017-03-22 DIAGNOSIS — Z7901 Long term (current) use of anticoagulants: Secondary | ICD-10-CM | POA: Diagnosis not present

## 2017-03-22 DIAGNOSIS — M79605 Pain in left leg: Secondary | ICD-10-CM | POA: Diagnosis not present

## 2017-03-22 DIAGNOSIS — R0789 Other chest pain: Secondary | ICD-10-CM | POA: Diagnosis not present

## 2017-03-22 LAB — CBC
HCT: 41.1 % (ref 40.0–52.0)
HEMOGLOBIN: 13.8 g/dL (ref 13.0–18.0)
MCH: 33.5 pg (ref 26.0–34.0)
MCHC: 33.7 g/dL (ref 32.0–36.0)
MCV: 99.5 fL (ref 80.0–100.0)
PLATELETS: 303 10*3/uL (ref 150–440)
RBC: 4.12 MIL/uL — AB (ref 4.40–5.90)
RDW: 15.2 % — ABNORMAL HIGH (ref 11.5–14.5)
WBC: 7.4 10*3/uL (ref 3.8–10.6)

## 2017-03-22 LAB — TROPONIN I

## 2017-03-22 LAB — BASIC METABOLIC PANEL
ANION GAP: 10 (ref 5–15)
BUN: 15 mg/dL (ref 6–20)
CALCIUM: 9.5 mg/dL (ref 8.9–10.3)
CHLORIDE: 107 mmol/L (ref 101–111)
CO2: 25 mmol/L (ref 22–32)
CREATININE: 1.31 mg/dL — AB (ref 0.61–1.24)
GFR calc non Af Amer: 58 mL/min — ABNORMAL LOW (ref >60)
Glucose, Bld: 140 mg/dL — ABNORMAL HIGH (ref 65–99)
Potassium: 4.2 mmol/L (ref 3.5–5.1)
SODIUM: 142 mmol/L (ref 135–145)

## 2017-03-22 LAB — CK: Total CK: 22 U/L — ABNORMAL LOW (ref 49–397)

## 2017-03-22 MED ORDER — KETOROLAC TROMETHAMINE 0.5 % OP SOLN
1.0000 [drp] | Freq: Four times a day (QID) | OPHTHALMIC | Status: DC
  Administered 2017-03-23 (×3): 1 [drp] via OPHTHALMIC
  Filled 2017-03-22: qty 3

## 2017-03-22 MED ORDER — ESCITALOPRAM OXALATE 10 MG PO TABS
20.0000 mg | ORAL_TABLET | Freq: Every day | ORAL | Status: DC
  Administered 2017-03-23: 20 mg via ORAL
  Filled 2017-03-22 (×2): qty 1

## 2017-03-22 MED ORDER — ONDANSETRON HCL 4 MG/2ML IJ SOLN: 4.0000 mg | Freq: Four times a day (QID) | INTRAMUSCULAR | Status: DC | PRN

## 2017-03-22 MED ORDER — LORAZEPAM 2 MG/ML IJ SOLN
1.0000 mg | Freq: Once | INTRAMUSCULAR | Status: AC
  Administered 2017-03-22: 1 mg via INTRAVENOUS
  Filled 2017-03-22: qty 1

## 2017-03-22 MED ORDER — ONDANSETRON HCL 4 MG PO TABS: 4.0000 mg | ORAL_TABLET | Freq: Four times a day (QID) | ORAL | Status: DC | PRN

## 2017-03-22 MED ORDER — FUROSEMIDE 20 MG PO TABS
20.0000 mg | ORAL_TABLET | Freq: Every day | ORAL | Status: DC
  Administered 2017-03-23: 20 mg via ORAL
  Filled 2017-03-22: qty 1

## 2017-03-22 MED ORDER — OFLOXACIN 0.3 % OP SOLN
1.0000 [drp] | Freq: Four times a day (QID) | OPHTHALMIC | Status: DC
  Administered 2017-03-23 (×3): 1 [drp] via OPHTHALMIC
  Filled 2017-03-22: qty 5

## 2017-03-22 MED ORDER — ACETAMINOPHEN 325 MG PO TABS: 650.0000 mg | ORAL_TABLET | Freq: Four times a day (QID) | ORAL | Status: DC | PRN

## 2017-03-22 MED ORDER — ABACAVIR-DOLUTEGRAVIR-LAMIVUD 600-50-300 MG PO TABS
1.0000 | ORAL_TABLET | Freq: Every day | ORAL | Status: DC
  Administered 2017-03-23: 1 via ORAL
  Filled 2017-03-22: qty 1

## 2017-03-22 MED ORDER — LEVOTHYROXINE SODIUM 50 MCG PO TABS
50.0000 ug | ORAL_TABLET | Freq: Every day | ORAL | Status: DC
  Administered 2017-03-23: 50 ug via ORAL
  Filled 2017-03-22: qty 1

## 2017-03-22 MED ORDER — IOPAMIDOL (ISOVUE-370) INJECTION 76%
75.0000 mL | Freq: Once | INTRAVENOUS | Status: AC | PRN
Start: 2017-03-22 — End: 2017-03-22
  Administered 2017-03-22: 75 mL via INTRAVENOUS

## 2017-03-22 MED ORDER — LOSARTAN POTASSIUM 25 MG PO TABS
25.0000 mg | ORAL_TABLET | Freq: Every morning | ORAL | Status: DC
  Administered 2017-03-23: 25 mg via ORAL
  Filled 2017-03-22: qty 1

## 2017-03-22 MED ORDER — ACETAMINOPHEN 650 MG RE SUPP: 650.0000 mg | Freq: Four times a day (QID) | RECTAL | Status: DC | PRN

## 2017-03-22 MED ORDER — PANTOPRAZOLE SODIUM 40 MG PO TBEC
40.0000 mg | DELAYED_RELEASE_TABLET | Freq: Every day | ORAL | Status: DC
  Administered 2017-03-23: 40 mg via ORAL
  Filled 2017-03-22: qty 1

## 2017-03-22 MED ORDER — FONDAPARINUX SODIUM 7.5 MG/0.6ML ~~LOC~~ SOLN
7.5000 mg | SUBCUTANEOUS | Status: DC
  Administered 2017-03-23: 7.5 mg via SUBCUTANEOUS
  Filled 2017-03-22: qty 0.6

## 2017-03-22 MED ORDER — MOMETASONE FURO-FORMOTEROL FUM 200-5 MCG/ACT IN AERO
2.0000 | INHALATION_SPRAY | Freq: Three times a day (TID) | RESPIRATORY_TRACT | Status: DC
  Administered 2017-03-23 (×2): 2 via RESPIRATORY_TRACT
  Filled 2017-03-22: qty 8.8

## 2017-03-22 MED ORDER — HYDROCODONE-ACETAMINOPHEN 5-325 MG PO TABS
1.0000 | ORAL_TABLET | ORAL | Status: DC | PRN
  Administered 2017-03-23 (×3): 1 via ORAL
  Filled 2017-03-22 (×3): qty 1

## 2017-03-22 MED ORDER — MORPHINE SULFATE (PF) 4 MG/ML IV SOLN
6.0000 mg | Freq: Once | INTRAVENOUS | Status: AC
  Administered 2017-03-22: 6 mg via INTRAVENOUS
  Filled 2017-03-22: qty 2

## 2017-03-22 MED ORDER — CARVEDILOL 12.5 MG PO TABS: 12.5000 mg | ORAL_TABLET | Freq: Two times a day (BID) | ORAL | Status: DC

## 2017-03-22 NOTE — ED Notes (Signed)
Lab called for recollect. Lab coming to draw patient.

## 2017-03-22 NOTE — ED Provider Notes (Signed)
[facility] Emergency Department Provider Note ____________________________________________   First MD Initiated Contact with Patient 03/22/17 1434     (approximate)  I have reviewed the triage vital signs and the nursing notes.   HISTORY  Chief Complaint Chest Pain and Leg Pain    HPI ARAMIS WEIL is a 61 y.o. male status post recent admission for right lower extremity DVT, currently on Lovenox, presents with left lower extremity pain. The pain is constant, worsening, worse with movement and with touching the leg. Patient denies any trauma to the leg. He reports difficulty moving it due to pain but denies weakness or numbness. Denies significant swelling to the left lower leg. Patient also reports intermittent chest pain that feels similar to when he had a PE, and some mild difficulty breathing. Denies acute pain in the right lower leg where the DVT was discovered previously.   Past Medical History:  Diagnosis Date  . Anemia   . Asthma   . Cataracts, bilateral    worse in Rt eye  . Collagen vascular disease (HCC)   . COPD (chronic obstructive pulmonary disease) (HCC)   . Coronary artery disease   . Depression   . DVT (deep venous thrombosis) (HCC)   . Dysrhythmia   . Emphysema/COPD (HCC)   . Environmental and seasonal allergies   . H/O blood clots   . Heart murmur   . HIV (human immunodeficiency virus infection) (HCC)   . Hypertension   . Leaky heart valve    x 3  . Lung mass   . Myocardial infarction (HCC)    in 2000  . Pneumonia    year ago  . Pulmonary emboli (HCC)   . Type 2 diabetes mellitus [facility])     Patient Active Problem List   Diagnosis Date Noted  . DVT (deep vein thrombosis) in pregnancy (HCC) 03/12/2017  . Acute deep vein thrombosis (DVT) of right lower extremity (HCC) 02/25/2017  . Postoperative pain   . COPD exacerbation (HCC) 12/23/2016  . Abdominal pain   . Compression fracture of body of thoracic vertebra (HCC)  12/21/2016  . Hypomagnesemia 05/05/2016  . Rhabdomyolysis 05/05/2016  . Acute pulmonary embolism (HCC) 03/17/2016  . Right leg DVT (HCC) 03/17/2016  . Hypokalemia 03/17/2016  . S/P IVC filter 03/17/2016  . SOB (shortness of breath) 03/15/2016  . Dysphagia 02/02/2016  . GERD (gastroesophageal reflux disease) 02/02/2016  . Hyperthyroidism 01/31/2016  . 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 Elevated transaminase level 01/31/2016  . Anemia 01/31/2016  . Thrombocytopenia (HCC) 01/31/2016  . Pyuria 01/31/2016  . Weakness 01/29/2016  . HIV (human immunodeficiency virus infection) (HCC) 01/29/2016  . CAD (coronary artery disease) 01/29/2016  . COPD (chronic obstructive pulmonary disease) (HCC) 01/29/2016  . Diabetes mellitus (HCC) 01/29/2016  . Leg weakness, bilateral 01/13/2016  . Chest pain 06/24/2015    Past Surgical History:  Procedure Laterality Date  . ANKLE ARTHROSCOPY    . IVC FILTER INSERTION    . KYPHOPLASTY N/A 12/21/2016   Procedure: KYPHOPLASTY;  Surgeon: Kennedy Bucker, MD;  Location: [facility] ORS;  Service: Orthopedics;  Laterality: N/A;  . PERIPHERAL VASCULAR CATHETERIZATION N/A 03/16/2016   Procedure: IVC Filter Insertion;  Surgeon: Renford Dills, MD;  Location: [facility] INVASIVE CV LAB;  Service: Cardiovascular;  Laterality: N/A;  . SINUS EXPLORATION    . WRIST ARTHROSCOPY Right     Prior to Admission medications   Medication Sig Start Date End Date Taking? Authorizing Provider  abacavir-dolutegravir-lamiVUDine [facility]) [id number]  MG tablet Take 1 tablet by mouth daily after breakfast. Reported on 01/30/2016 01/27/16  Yes [provider]  atorvastatin (LIPITOR) 10 MG tablet Take 10 mg by mouth daily after breakfast. 11/16/16  Yes [provider]  Calcium Carbonate-Vitamin D (CALCIUM 600+D) 600-400 MG-UNIT tablet Take 1 tablet by mouth daily after breakfast.    Yes [provider]  carvedilol (COREG) 12.5 MG tablet Take 12.5 mg by mouth 2 (two)  times daily with a meal.   Yes [provider]  escitalopram (LEXAPRO) 20 MG tablet Take 20 mg by mouth daily after breakfast.    Yes [provider]  fondaparinux (ARIXTRA) 7.5 MG/0.6ML SOLN injection Inject 0.6 mLs (7.5 mg total) into the skin daily. 03/15/17  Yes Gouru, Deanna Artis, MD  furosemide (LASIX) 20 MG tablet Take 20 mg by mouth daily.  10/22/16  Yes [provider]  ketorolac (ACULAR) 0.5 % ophthalmic solution Place 1 drop into the left eye 4 (four) times daily. 02/03/17  Yes [provider]  levothyroxine (SYNTHROID, LEVOTHROID) 50 MCG tablet Take 1 tablet (50 mcg total) by mouth daily before breakfast. 03/05/17 04/04/17 Yes Auburn Bilberry, MD  LINZESS 72 MCG capsule Take 72 mcg by mouth daily after breakfast. 11/10/16  Yes [provider]  losartan (COZAAR) 25 MG tablet Take 25 mg by mouth every morning. 03/03/17  Yes [provider]  magnesium oxide (MAG-OX) 400 MG tablet Take 400 mg by mouth daily after breakfast.   Yes [provider]  mirtazapine (REMERON) 15 MG tablet Take 15 mg by mouth daily.    Yes [provider]  mometasone-formoterol (DULERA) 200-5 MCG/ACT AERO Inhale 2 puffs into the lungs 3 (three) times daily.    Yes [provider]  Multiple Vitamin (MULTIVITAMIN WITH MINERALS) TABS tablet Take 1 tablet by mouth daily. ONE-A-DAY MULTIVITAMIN 50+   Yes [provider]  ofloxacin (OCUFLOX) 0.3 % ophthalmic solution Place 1 drop into the left eye 4 (four) times daily. 01/31/17  Yes [provider]  omeprazole (PRILOSEC) 40 MG capsule Take 40 mg by mouth daily after breakfast. 11/16/16  Yes [provider]  Potassium Chloride CR (MICRO-K) 8 MEQ CPCR capsule CR Take 1 capsule by mouth daily after breakfast. 11/15/16  Yes [provider]  acetaminophen (TYLENOL) 325 MG tablet Take 1 tablet (325 mg total) by mouth every 6 (six) hours as needed for mild pain (or Fever >/= 101).  03/15/17   Gouru, Aruna, MD  albuterol (PROVENTIL HFA;VENTOLIN HFA) 108 (90 BASE) MCG/ACT inhaler Inhale 2 puffs into the lungs every 6 (six) hours as needed for wheezing or shortness of breath.    [provider]  albuterol (PROVENTIL) (2.5 MG/3ML) 0.083% nebulizer solution Take 2.5 mg by nebulization 3 (three) times daily.     [provider]  cetirizine (ZYRTEC) 10 MG tablet Take 10 mg by mouth daily as needed for allergies.     [provider]  EPINEPHrine (EPIPEN 2-PAK) 0.3 mg/0.3 mL IJ SOAJ injection Inject 0.3 mg into the muscle once as needed (for severe allergic reaction).    [provider]  guaiFENesin (MUCINEX) 600 MG 12 hr tablet Take 1 tablet (600 mg total) by mouth 2 (two) times daily. Patient taking differently: Take 600 mg by mouth 2 (two) times daily as needed.  03/15/17   Ramonita Lab, MD  HYDROcodone-acetaminophen (NORCO) 5-325 MG tablet Take 1 tablet by mouth every 4 (four) hours as needed for moderate pain. Patient not taking: Reported  on 03/17/2017 03/15/17   Ramonita LabGouru, Aruna, MD  nitroGLYCERIN (NITROSTAT) 0.4 MG SL tablet Place 0.4 mg under the tongue every 5 (five) minutes as needed for chest pain.    [provider]  zolpidem (AMBIEN) 5 MG tablet Take 5 mg by mouth at bedtime. 10/15/16   [provider]    Allergies Aspirin; Bee venom; Penicillins; Prednisone; Sulfa antibiotics; Sulfasalazine; Theophyllines; and Theophylline  Family History  Problem Relation Age of Onset  . CAD Unknown   . Asthma Mother   . Cirrhosis Father   . Deep vein thrombosis Neg Hx   . Diabetes Neg Hx     Social History Social History  Substance Use Topics  . Smoking status: Former Smoker    Quit date: 12/16/2001  . Smokeless tobacco: Never Used  . Alcohol use No    Review of Systems  Constitutional: No fever/chills Eyes: No visual changes. ENT: No sore throat. Cardiovascular:Positive for chest pain.  Respiratory:Positive for shortness  breath  Gastrointestinal: No nausea, no vomiting.  No diarrhea.  Genitourinary: Negative for dysuria.  Musculoskeletal:Positive for left lower extremity pain. Negative for extremity swelling.  Skin: Negative for rash. Neurological: Negative for headaches, focal weakness or numbness.   ____________________________________________   PHYSICAL EXAM:  VITAL SIGNS: ED Triage Vitals [03/22/17 1150]  Enc Vitals Group     BP 121/72     Pulse Rate 74     Resp 16     Temp 98.1 F (36.7 C)     Temp src      SpO2 99 %     Weight 187 lb (84.8 kg)     Height 5\' 5"  (1.651 m)     Head Circumference      Peak Flow      Pain Score 10     Pain Loc      Pain Edu?      Excl. in GC?     Constitutional: Alert and oriented. Uncomfortable but not acutely ill appearing. Eyes: Conjunctivae are normal.  Head: Atraumatic. Nose: No congestion/rhinnorhea. Mouth/Throat: Mucous membranes are moist.   Neck: Normal range of motion.  Cardiovascular: Normal rate, regular rhythm. Grossly normal heart sounds.  Good peripheral circulation. Respiratory: Normal respiratory effort.  No retractions. Lungs CTAB. Gastrointestinal: Soft and nontender. No distention.  Genitourinary: No CVA tenderness. Musculoskeletal: No lower extremity edema.  Extremities warm and well perfused. No erythema, induration or warmth to bilateral lower extremities. Left lower extremity with diffuse tenderness to palpation. And pain on range of motion of all joints. 2+ distal pulses equal bilaterally. Cap refill less than 2 seconds bilaterally.  Neurologic:  Normal speech and language. No gross focal neurologic deficits are appreciated.  Skin:  Skin is warm and dry. No rash noted. Psychiatric: Mood and affect are normal. Speech and behavior are normal.  ____________________________________________   LABS (all labs ordered are listed, but only abnormal results are displayed)  Labs Reviewed  BASIC METABOLIC PANEL - Abnormal; Notable  for the following:       Result Value   Glucose, Bld 140 (*)    Creatinine, Ser 1.31 (*)    GFR calc non Af Amer 58 (*)    All other components within normal limits  CBC - Abnormal; Notable for the following:    RBC 4.12 (*)    RDW 15.2 (*)    All other components within normal limits  TROPONIN I  BASIC METABOLIC PANEL  CBC  TROPONIN I   ____________________________________________  EKG  ED ECG REPORT I, Dionne Bucy, the attending physician, personally viewed and interpreted this ECG.  Date: 03/22/2017 EKG Time: 11:47 Rate: 78 Rhythm: normal sinus rhythm QRS Axis: normal Intervals: normal ST/T Wave abnormalities: normal Narrative Interpretation: unremarkable  ____________________________________________  RADIOLOGY    ____________________________________________   PROCEDURES  Procedure(s) performed: No    Critical Care performed: No ____________________________________________   INITIAL IMPRESSION / ASSESSMENT AND PLAN / ED COURSE  Pertinent labs & imaging results that were available during my care of the patient were reviewed by me and considered in my medical decision making (see chart for details).  61 year old male history of recent diagnosis of right lower extremity DVT and PE, on Lovenox, presents with worsening left lower extremity pain which is atraumatic.  No prior history of DVT in the left lower extremity. Patient has not had any new symptoms of the right lower surgery. He also reports associated chest pain which feels similar to the PE. Vital signs are normal, patient is slightly uncomfortable but not acutely ill appearing. Exam as described. There is tenderness and pain on range of motion of the left lower extremity but no other significant findings. Patient's extremities appear well perfused, with equal pulses distally in lower extremity. Differential includes acute DVT, versus neuropathic pain, radiculopathy, or muscular pain. No suspicion  for acute PE given patient's normal vital signs, normal EKG and negative troponin. However given the patient's high risk and acute symptoms we'll obtain CT chest to rule out PE. Plan for bilateral lower extremity ultrasound to rule out acute DVT. Cardiac enzymes are negative and other labs are unremarkable. If negative ED workup will reassess and consider possible d/c home with cont lovenox.      ____________________________________________   FINAL CLINICAL IMPRESSION(S) / ED DIAGNOSES  Final diagnoses:  Left leg pain  Chest pain, unspecified type      NEW MEDICATIONS STARTED DURING THIS VISIT:  New Prescriptions   No medications on file     Note:  This document was prepared using Dragon voice recognition software and may include unintentional dictation errors.    Dionne Bucy, MD 03/22/17 [phone]

## 2017-03-22 NOTE — ED Triage Notes (Signed)
C/O central chest pain and left leg leg pain.  States pain started last night and has continued this morning.

## 2017-03-22 NOTE — ED Notes (Signed)
Emma RN aware of placement in Room 4.

## 2017-03-22 NOTE — ED Triage Notes (Signed)
Patient has history of DVT to right leg and is currently taking Lovenox injections daily.

## 2017-03-22 NOTE — ED Notes (Signed)
Patient waiting for room in ED.  States that he could not stand for CXR and had to have it done sitting in chair.

## 2017-03-22 NOTE — ED Notes (Signed)
Report taken from Emma R.N. 

## 2017-03-22 NOTE — ED Provider Notes (Signed)
  Signout from Dr. Marisa SeverinSiadecki in this 61 year old male with a recent history of right lower artery DVT on Lovenox was presenting with left lower extremity pain and chest pain. Plan is follow up with the ultrasound of his lower extremities as well as the CTA of his chest.  Physical Exam  BP 121/80   Pulse 66   Temp 98.1 F (36.7 C)   Resp (!) 21   Ht 5\' 5"  (1.651 m)   Wt 84.8 kg (187 lb)   SpO2 99%   BMI 31.12 kg/m   ----------------------------------------- 7:00 PM on 03/22/2017 -----------------------------------------   Physical Exam Patient without any distress at this time but tender to palpation over the left thigh where he says he is pain is located. Pain is to both the anterior as well as medial and lateral aspects. There is no swelling or ecchymosis. There is no edema. He has peripheral pulses in both lower extremities. ED Course  Procedures  MDM Patient given a total of 6 mg of morphine as well as 1 mg of lorazepam without any pain relief. Patient says that he has not had any pain relief. His sister is at the bedside, who is also his caretaker, who says that she has no longer able to take care of him and like him to be admitted to the hospital. I discussed with the patient of possible rehabilitation or skilled nursing facility placement which he is refusing. He'll be admitted to the hospital for pain control. He was signed out to Dr. Seth BakeV who is requesting a CT noncontrast of his left lower extremity to rule out hematoma.       Myrna BlazerSchaevitz, David Matthew, MD 03/22/17 [phone]

## 2017-03-22 NOTE — ED Notes (Signed)
Gave pt urinal to use bathroom 

## 2017-03-22 NOTE — H&P (Signed)
[facility] [place] [facility]   PATIENT NAME: Brian Hampton    MR#:  [id number]  DATE OF BIRTH:  08/15/1955  DATE OF ADMISSION:  03/22/2017  PRIMARY CARE PHYSICIAN: Mick Sell, MD   REQUESTING/REFERRING PHYSICIAN: Pershing Proud, MD  CHIEF COMPLAINT:   Chief Complaint  Patient presents with  . Chest Pain  . Leg Pain    HISTORY OF PRESENT ILLNESS:  Brian Hampton  is a 61 y.o. male who presents with left leg pain. Patient states that he has significant tenderness in his left thigh, that this pain has been causing him to fall at home. He was recently admitted here and found to have right leg DVTs, and was placed on anticoagulation. Evaluation tonight shows that his still has right leg DVT, though quite burden has decreased. Significant evaluation of his left leg shows no abnormality. Patient has a fair amount of pain and states that he cannot walk on that leg. He states that his outpatient physician told him at some point recently that he likely needed a vascular specialist to look at that leg.  Hospitalists were called for admission  PAST MEDICAL HISTORY:   Past Medical History:  Diagnosis Date  . Anemia   . Asthma   . Cataracts, bilateral    worse in Rt eye  . Collagen vascular disease (HCC)   . COPD (chronic obstructive pulmonary disease) (HCC)   . Coronary artery disease   . Depression   . DVT (deep venous thrombosis) (HCC)   . Dysrhythmia   . Emphysema/COPD (HCC)   . Environmental and seasonal allergies   . H/O blood clots   . Heart murmur   . HIV (human immunodeficiency virus infection) (HCC)   . Hypertension   . Leaky heart valve    x 3  . Lung mass   . Myocardial infarction (HCC)    in 2000  . Pneumonia    year ago  . Pulmonary emboli (HCC)   . Type 2 diabetes mellitus (HCC)     PAST SURGICAL HISTORY:   Past Surgical History:  Procedure Laterality Date  . ANKLE ARTHROSCOPY    . IVC FILTER INSERTION    . KYPHOPLASTY N/A  12/21/2016   Procedure: KYPHOPLASTY;  Surgeon: Kennedy Bucker, MD;  Location: [facility] ORS;  Service: Orthopedics;  Laterality: N/A;  . PERIPHERAL VASCULAR CATHETERIZATION N/A 03/16/2016   Procedure: IVC Filter Insertion;  Surgeon: Renford Dills, MD;  Location: [facility] INVASIVE CV LAB;  Service: Cardiovascular;  Laterality: N/A;  . SINUS EXPLORATION    . WRIST ARTHROSCOPY Right     SOCIAL HISTORY:   Social History  Substance Use Topics  . Smoking status: Former Smoker    Quit date: 12/16/2001  . Smokeless tobacco: Never Used  . Alcohol use No    FAMILY HISTORY:   Family History  Problem Relation Age of Onset  . CAD Unknown   . Asthma Mother   . Cirrhosis Father   . Deep vein thrombosis Neg Hx   . Diabetes Neg Hx     DRUG ALLERGIES:   Allergies  Allergen Reactions  . Aspirin Anaphylaxis  . Bee Venom Anaphylaxis  . Penicillins Anaphylaxis and Other (See Comments)    Has patient had a PCN reaction causing immediate rash, facial/tongue/throat swelling, SOB or lightheadedness with hypotension: Yes Has patient had a PCN reaction causing severe rash involving mucus membranes or skin necrosis: No Has patient had a PCN reaction that required hospitalization No Has  patient had a PCN reaction occurring within the last 10 years: Yes If all of the above answers are "NO", then may proceed with Cephalosporin use.  . Prednisone Anaphylaxis  . Sulfa Antibiotics Anaphylaxis  . Sulfasalazine Anaphylaxis  . Theophyllines Anaphylaxis  . Theophylline Swelling    MEDICATIONS AT HOME:   Prior to Admission medications   Medication Sig Start Date End Date Taking? Authorizing Provider  abacavir-dolutegravir-lamiVUDine (TRIUMEQ) 600-50-300 MG tablet Take 1 tablet by mouth daily after breakfast. Reported on 01/30/2016 01/27/16  Yes [provider]  atorvastatin (LIPITOR) 10 MG tablet Take 10 mg by mouth daily after breakfast. 11/16/16  Yes [provider]  Calcium Carbonate-Vitamin D  (CALCIUM 600+D) 600-400 MG-UNIT tablet Take 1 tablet by mouth daily after breakfast.    Yes [provider]  carvedilol (COREG) 12.5 MG tablet Take 12.5 mg by mouth 2 (two) times daily with a meal.   Yes [provider]  escitalopram (LEXAPRO) 20 MG tablet Take 20 mg by mouth daily after breakfast.    Yes [provider]  fondaparinux (ARIXTRA) 7.5 MG/0.6ML SOLN injection Inject 0.6 mLs (7.5 mg total) into the skin daily. 03/15/17  Yes Gouru, Deanna Artis, MD  furosemide (LASIX) 20 MG tablet Take 20 mg by mouth daily.  10/22/16  Yes [provider]  ketorolac (ACULAR) 0.5 % ophthalmic solution Place 1 drop into the left eye 4 (four) times daily. 02/03/17  Yes [provider]  levothyroxine (SYNTHROID, LEVOTHROID) 50 MCG tablet Take 1 tablet (50 mcg total) by mouth daily before breakfast. 03/05/17 04/04/17 Yes Auburn Bilberry, MD  LINZESS 72 MCG capsule Take 72 mcg by mouth daily after breakfast. 11/10/16  Yes [provider]  losartan (COZAAR) 25 MG tablet Take 25 mg by mouth every morning. 03/03/17  Yes [provider]  magnesium oxide (MAG-OX) 400 MG tablet Take 400 mg by mouth daily after breakfast.   Yes [provider]  mirtazapine (REMERON) 15 MG tablet Take 15 mg by mouth daily.    Yes [provider]  mometasone-formoterol (DULERA) 200-5 MCG/ACT AERO Inhale 2 puffs into the lungs 3 (three) times daily.    Yes [provider]  Multiple Vitamin (MULTIVITAMIN WITH MINERALS) TABS tablet Take 1 tablet by mouth daily. ONE-A-DAY MULTIVITAMIN 50+   Yes [provider]  ofloxacin (OCUFLOX) 0.3 % ophthalmic solution Place 1 drop into the left eye 4 (four) times daily. 01/31/17  Yes [provider]  omeprazole (PRILOSEC) 40 MG capsule Take 40 mg by mouth daily after breakfast. 11/16/16  Yes [provider]  Potassium Chloride CR (MICRO-K) 8 MEQ CPCR capsule CR Take 1 capsule by mouth daily after breakfast.  11/15/16  Yes [provider]  acetaminophen (TYLENOL) 325 MG tablet Take 1 tablet (325 mg total) by mouth every 6 (six) hours as needed for mild pain (or Fever >/= 101). 03/15/17   Gouru, Aruna, MD  albuterol (PROVENTIL HFA;VENTOLIN HFA) 108 (90 BASE) MCG/ACT inhaler Inhale 2 puffs into the lungs every 6 (six) hours as needed for wheezing or shortness of breath.    [provider]  albuterol (PROVENTIL) (2.5 MG/3ML) 0.083% nebulizer solution Take 2.5 mg by nebulization 3 (three) times daily.     [provider]  cetirizine (ZYRTEC) 10 MG tablet Take 10 mg by mouth daily as needed for allergies.     [provider]  EPINEPHrine (EPIPEN 2-PAK) 0.3 mg/0.3 mL IJ SOAJ injection Inject 0.3 mg into the muscle once as needed (for  severe allergic reaction).    [provider]  guaiFENesin (MUCINEX) 600 MG 12 hr tablet Take 1 tablet (600 mg total) by mouth 2 (two) times daily. Patient taking differently: Take 600 mg by mouth 2 (two) times daily as needed.  03/15/17   Ramonita LabGouru, Aruna, MD  HYDROcodone-acetaminophen (NORCO) 5-325 MG tablet Take 1 tablet by mouth every 4 (four) hours as needed for moderate pain. Patient not taking: Reported on 03/17/2017 03/15/17   Ramonita LabGouru, Aruna, MD  nitroGLYCERIN (NITROSTAT) 0.4 MG SL tablet Place 0.4 mg under the tongue every 5 (five) minutes as needed for chest pain.    [provider]  zolpidem (AMBIEN) 5 MG tablet Take 5 mg by mouth at bedtime. 10/15/16   [provider]    REVIEW OF SYSTEMS:  Review of Systems  Constitutional: Negative for chills, fever, malaise/fatigue and weight loss.  HENT: Negative for ear pain, hearing loss and tinnitus.   Eyes: Negative for blurred vision, double vision, pain and redness.  Respiratory: Negative for cough, hemoptysis and shortness of breath.   Cardiovascular: Negative for chest pain, palpitations, orthopnea and leg swelling.  Gastrointestinal: Negative for abdominal pain,  constipation, diarrhea, nausea and vomiting.  Genitourinary: Negative for dysuria, frequency and hematuria.  Musculoskeletal: Negative for back pain, joint pain and neck pain.       Left leg muscular pain  Skin:       No acne, rash, or lesions  Neurological: Negative for dizziness, tremors, focal weakness and weakness.  Endo/Heme/Allergies: Negative for polydipsia. Does not bruise/bleed easily.  Psychiatric/Behavioral: Negative for depression. The patient is not nervous/anxious and does not have insomnia.      VITAL SIGNS:   Vitals:   03/22/17 1900 03/22/17 2000 03/22/17 2030 03/22/17 2100  BP: (!) 134/94 (!) 120/101 128/89 (!) 143/102  Pulse: 84 86 84 93  Resp:  (!) 21 (!) 23 18  Temp:      SpO2: 98% 99% 98% 97%  Weight:      Height:       Wt Readings from Last 3 Encounters:  03/22/17 84.8 kg (187 lb)  03/12/17 87.1 kg (192 lb)  03/05/17 86.5 kg (190 lb 11.2 oz)    PHYSICAL EXAMINATION:  Physical Exam  Vitals reviewed. Constitutional: He is oriented to person, place, and time. He appears well-developed and well-nourished. No distress.  HENT:  Head: Normocephalic and atraumatic.  Mouth/Throat: Oropharynx is clear and moist.  Eyes: Pupils are equal, round, and reactive to light. Conjunctivae and EOM are normal. No scleral icterus.  Neck: Normal range of motion. Neck supple. No JVD present. No thyromegaly present.  Cardiovascular: Normal rate, regular rhythm and intact distal pulses.  Exam reveals no gallop and no friction rub.   No murmur heard. Respiratory: Effort normal and breath sounds normal. No respiratory distress. He has no wheezes. He has no rales.  GI: Soft. Bowel sounds are normal. He exhibits no distension. There is no tenderness.  Musculoskeletal: Normal range of motion. He exhibits tenderness (very mild tenderness to palpation of the left thigh). He exhibits no edema.  No arthritis, no gout  Lymphadenopathy:    He has no cervical adenopathy.  Neurological:  He is alert and oriented to person, place, and time. No cranial nerve deficit.  No dysarthria, no aphasia  Skin: Skin is warm and dry. No rash noted. No erythema.  Psychiatric: He has a normal mood and affect. His behavior is normal. Judgment and thought content normal.    LABORATORY PANEL:  CBC  Recent Labs Lab 03/22/17 1230  WBC 7.4  HGB 13.8  HCT 41.1  PLT 303   ------------------------------------------------------------------------------------------------------------------  Chemistries   Recent Labs Lab 03/22/17 1230  NA 142  K 4.2  CL 107  CO2 25  GLUCOSE 140*  BUN 15  CREATININE 1.31*  CALCIUM 9.5   ------------------------------------------------------------------------------------------------------------------  Cardiac Enzymes  Recent Labs Lab 03/22/17 1230  TROPONINI <0.03   ------------------------------------------------------------------------------------------------------------------  RADIOLOGY:  Dg Chest 2 View  Result Date: 03/22/2017 CLINICAL DATA:  Central chest pain. EXAM: CHEST  2 VIEW COMPARISON:  03/13/2017 FINDINGS: Heart and mediastinal contours are within normal limits. No focal opacities or effusions. No acute bony abnormality. IMPRESSION: No active cardiopulmonary disease. Electronically Signed   By: Charlett Nose M.D.   On: 03/22/2017 13:32   Ct Angio Chest Pe W And/or Wo Contrast  Result Date: 03/22/2017 CLINICAL DATA:  Central chest pain EXAM: CT ANGIOGRAPHY CHEST WITH CONTRAST TECHNIQUE: Multidetector CT imaging of the chest was performed using the standard protocol during bolus administration of intravenous contrast. Multiplanar CT image reconstructions and MIPs were obtained to evaluate the vascular anatomy. CONTRAST:  75 mL Isovue 370. COMPARISON:  03/03/2017, 06/24/2015 FINDINGS: Cardiovascular: Thoracic aorta is well visualize without evidence of aneurysmal dilatation or dissection. No significant calcific changes are seen. Mild  coronary calcifications are noted. The pulmonary artery shows a normal branching pattern bilaterally without definitive pulmonary embolus. Mediastinum/Nodes: The thoracic inlet is within normal limits. No significant hilar or mediastinal adenopathy is noted. Lungs/Pleura: Lungs are well aerated bilaterally. A tiny nodule is noted in the left upper lobe best seen on image number 45 of series 6. This is better visualized on the current examination when compared with the most recent study but is in retrospect stable from 2016. No focal infiltrate or sizable effusion is noted. Upper Abdomen: Upper abdomen shows no focal abnormality. Musculoskeletal: Degenerative changes of the thoracic spine are seen. Some old healed rib fractures are noted on the right changes of prior vertebral augmentation at T6 with chronic appearing T8 compression deformity is seen. Partial anterior fusion is noted at C4-5. Review of the MIP images confirms the above findings. IMPRESSION: No evidence of pulmonary emboli. No acute abnormality is noted. Chronic changes as described above. Electronically Signed   By: Alcide Clever M.D.   On: 03/22/2017 16:32   Ct Femur Left Wo Contrast  Result Date: 03/22/2017 CLINICAL DATA:  Left thigh pain for 3 days.  No known injury. EXAM: CT OF THE LOWER LEFT EXTREMITY WITHOUT CONTRAST TECHNIQUE: Multidetector CT imaging of the lower left extremity was performed according to the standard protocol. COMPARISON:  Hila the FINDINGS: The hip and knee joints are maintained. Mild degenerative changes. No findings for hip AVN. No femur fracture or bone lesion. Bony density near the anterior inferior iliac spine on the left likely an old avulsion injury. The hip and pelvic musculature appear normal. No obvious muscle tear or hematoma. No significant intrapelvic findings. Contrast noted in the bladder from the recent chest CT. No significant intrapelvic abnormalities. No inguinal mass or adenopathy. Small bilateral  hydroceles are noted. IMPRESSION: 1. Mild hip and knee joint degenerative changes but no acute bony findings involving the left femur. 2. Unremarkable appearance of the hip and pelvic musculature. 3. No significant intrapelvic abnormalities. Electronically Signed   By: Rudie Meyer M.D.   On: 03/22/2017 19:48   US Venous Img Lower Bilateral  Result Date: 03/22/2017 CLINICAL DATA:  Recent right DVT. New onset severe  left lower extremity pain. EXAM: BILATERAL LOWER EXTREMITY VENOUS DOPPLER ULTRASOUND TECHNIQUE: Gray-scale sonography with graded compression, as well as color Doppler and duplex ultrasound were performed to evaluate the lower extremity deep venous systems from the level of the common femoral vein and including the common femoral, femoral, profunda femoral, popliteal and calf veins including the posterior tibial, peroneal and gastrocnemius veins when visible. The superficial great saphenous vein was also interrogated. Spectral Doppler was utilized to evaluate flow at rest and with distal augmentation maneuvers in the common femoral, femoral and popliteal veins. COMPARISON:  March 12, 2017. FINDINGS: RIGHT LOWER EXTREMITY Common Femoral Vein: No evidence of thrombus. Normal compressibility, respiratory phasicity and response to augmentation. Saphenofemoral Junction: No evidence of thrombus. Normal compressibility and flow on color Doppler imaging. Profunda Femoral Vein: Resolved nonocclusive thrombus. No evidence of new thrombus. Normal compressibility and flow on color Doppler imaging. Femoral Vein: Occlusive thrombus in the mid to distal femoral vein, similar to prior study. Popliteal Vein: Nonocclusive thrombus within the popliteal vein, improved when compared to prior study. Calf Veins: Resolved nonocclusive thrombus in the posterior tibial vein. No evidence of new thrombus. Normal compressibility and flow on color Doppler imaging. Superficial Great Saphenous Vein: No evidence of thrombus. Normal  compressibility and flow on color Doppler imaging. Venous Reflux:  None. Other Findings:  None. LEFT LOWER EXTREMITY Common Femoral Vein: No evidence of thrombus. Normal compressibility, respiratory phasicity and response to augmentation. Saphenofemoral Junction: No evidence of thrombus. Normal compressibility and flow on color Doppler imaging. Profunda Femoral Vein: No evidence of thrombus. Normal compressibility and flow on color Doppler imaging. Femoral Vein: No evidence of thrombus. Normal compressibility, respiratory phasicity and response to augmentation. Popliteal Vein: No evidence of thrombus. Normal compressibility, respiratory phasicity and response to augmentation. Calf Veins: No evidence of thrombus. Normal compressibility and flow on color Doppler imaging. Superficial Great Saphenous Vein: No evidence of thrombus. Normal compressibility and flow on color Doppler imaging. Venous Reflux:  None. Other Findings:  None. IMPRESSION: 1. Improved clot burden within the right lower extremity, with resolved nonocclusive thrombus in the profunda femoral and posterior tibial veins. Decreased thrombus burden within the popliteal vein, now nonocclusive. Persistent occlusive thrombus within the mid to distal femoral vein. 2. No evidence of deep venous thrombosis within the left lower extremity. Electronically Signed   By: Obie Dredge M.D.   On: 03/22/2017 17:27    EKG:   Orders placed or performed during the hospital encounter of 03/22/17  . EKG 12-Lead  . EKG 12-Lead  . ED EKG within 10 minutes  . 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 No DVT on ultrasound, no intramuscular pathology on CT scan. We will get a vascular surgery consult as the patient states that this is  artery something that had been recommended to him though it is unclear what pathology is being evaluated. Active Problems:   HIV (human immunodeficiency virus infection) (HCC) - continue home meds   CAD (coronary artery disease) - home medications   COPD (chronic obstructive pulmonary disease) (HCC) - home dose inhalers   DVT (deep venous thrombosis) (HCC) - continue anticoagulation   GERD (gastroesophageal reflux disease) - home dose PPI  All the records are reviewed and case discussed with ED provider.  Management plans discussed with the patient and/or family.  DVT PROPHYLAXIS: Systemic anticoagulation  GI PROPHYLAXIS: PPI  ADMISSION STATUS: Observation  CODE STATUS: Full Code Status History    Date Active Date Inactive Code Status Order ID Comments User Context   03/12/2017  8:23 AM 03/15/2017  6:42 PM Full Code [id number]  Ihor Austin, MD Inpatient   03/04/2017  4:05 AM 03/05/2017  6:33 PM Full Code [id number]  Arnaldo Natal, MD Inpatient   02/25/2017 11:52 PM 02/27/2017  6:06 PM DNR [id number]  Hugelmeyer, Jon Gills, DO Inpatient   02/25/2017 10:27 PM 02/25/2017 11:52 PM Full Code [id number]  Hugelmeyer, Jon Gills, DO Inpatient   12/21/2016  6:14 PM 12/24/2016  3:03 PM DNR [id number]  Kennedy Bucker, MD Inpatient   05/05/2016 10:17 PM 05/12/2016  6:20 PM DNR [id number]  Altamese Dilling, MD Inpatient   03/15/2016  5:42 PM 03/17/2016  6:59 PM DNR [id number]  Auburn Bilberry, MD ED   03/15/2016  5:29 PM 03/15/2016  5:42 PM Full Code [id number]  Auburn Bilberry, MD ED   01/30/2016 12:55 AM 02/03/2016 12:55 AM DNR [id number]  Gery Pray, MD Inpatient   01/13/2016  9:46 PM 01/16/2016  4:18 PM Full Code [id number]  Shaune Pollack, MD Inpatient   06/24/2015  7:13 AM 06/25/2015 12:27 PM Full Code [id number]  Milagros Loll, MD ED    Advance Directive Documentation     Most Recent Value  Type of Advance Directive  Living will  Pre-existing out of facility DNR order (yellow form or pink MOST form)  -  "MOST" Form in  Place?  -      TOTAL TIME TAKING CARE OF THIS PATIENT: 40 minutes.   Jaileen Janelle FIELDING 03/22/2017, 10:16 PM  [employer]  [phone]  CC: Primary care physician; Mick Sell, MD  Note:  This document was prepared using Dragon voice recognition software and may include unintentional dictation errors.

## 2017-03-22 NOTE — ED Notes (Signed)
To Xray via WC

## 2017-03-23 DIAGNOSIS — M79605 Pain in left leg: Secondary | ICD-10-CM | POA: Diagnosis not present

## 2017-03-23 DIAGNOSIS — E119 Type 2 diabetes mellitus without complications: Secondary | ICD-10-CM | POA: Diagnosis not present

## 2017-03-23 DIAGNOSIS — Z86718 Personal history of other venous thrombosis and embolism: Secondary | ICD-10-CM | POA: Diagnosis not present

## 2017-03-23 DIAGNOSIS — I2510 Atherosclerotic heart disease of native coronary artery without angina pectoris: Secondary | ICD-10-CM | POA: Diagnosis not present

## 2017-03-23 DIAGNOSIS — I10 Essential (primary) hypertension: Secondary | ICD-10-CM | POA: Diagnosis not present

## 2017-03-23 DIAGNOSIS — I82409 Acute embolism and thrombosis of unspecified deep veins of unspecified lower extremity: Secondary | ICD-10-CM | POA: Diagnosis not present

## 2017-03-23 MED ORDER — CARVEDILOL 12.5 MG PO TABS
12.5000 mg | ORAL_TABLET | Freq: Two times a day (BID) | ORAL | Status: DC
  Administered 2017-03-23 (×2): 12.5 mg via ORAL
  Filled 2017-03-23 (×2): qty 1

## 2017-03-23 NOTE — Progress Notes (Signed)
Patient is alert and oriented and able to verbalize needs. No complaints of pain at this time. VS stable. PIV removed. Sister at bedside. Discharge instructions gone over with patient. Printed AVS given to patient. Patient verbalizes understanding of all discharge instructions and follow up care. No concerns voiced. Belongings packed up. Sister to transport patient home.   Suzan SlickAlison L Dario Yono, RN

## 2017-03-23 NOTE — Discharge Summary (Addendum)
[employer] [facility] [place] [facility]   PATIENT NAME: Brian Hampton    MR#:  [id number]  DATE OF BIRTH:  06/05/1956  DATE OF ADMISSION:  03/22/2017   ADMITTING PHYSICIAN: Oralia Manis, MD  DATE OF DISCHARGE: 03/23/2017  PRIMARY CARE PHYSICIAN: Mick Sell, MD   ADMISSION DIAGNOSIS:  Left leg pain [M79.605] Chest pain, unspecified type [R07.9] DISCHARGE DIAGNOSIS:  Principal Problem:   Left leg pain Active Problems:   HIV (human immunodeficiency virus infection) (HCC)   CAD (coronary artery disease)   COPD (chronic obstructive pulmonary disease) (HCC)   GERD (gastroesophageal reflux disease)   DVT (deep venous thrombosis) (HCC)  SECONDARY DIAGNOSIS:   Past Medical History:  Diagnosis Date  . Anemia   . Asthma   . Cataracts, bilateral    worse in Rt eye  . Collagen vascular disease (HCC)   . COPD (chronic obstructive pulmonary disease) (HCC)   . Coronary artery disease   . Depression   . DVT (deep venous thrombosis) (HCC)   . Dysrhythmia   . Emphysema/COPD (HCC)   . Environmental and seasonal allergies   . H/O blood clots   . Heart murmur   . HIV (human immunodeficiency virus infection) (HCC)   . Hypertension   . Leaky heart valve    x 3  . Lung mass   . Myocardial infarction (HCC)    in 2000  . Pneumonia    year ago  . Pulmonary emboli (HCC)   . 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 Pending vascular surgery consult. The patient may be discharged to home and outpatient follow-up per Dr. Wyn Quaker.  Recent right leg DVT. Improving per venous ultrasound. Continue fondaparinux.    HIV (human immunodeficiency virus infection) (HCC) - continue home meds   CAD (coronary artery disease) - home medications   COPD (chronic obstructive pulmonary disease) (HCC) - home dose inhalers   DVT (deep  venous thrombosis) (HCC) - continue anticoagulation   GERD (gastroesophageal reflux disease) - home dose PPI  DISCHARGE CONDITIONS:  Stable, discharge to home today. CONSULTS OBTAINED:  Treatment Team:  Annice Needy, MD DRUG ALLERGIES:   Allergies  Allergen Reactions  . Aspirin Anaphylaxis  . Bee Venom Anaphylaxis  . Penicillins Anaphylaxis and Other (See Comments)    Has patient had a PCN reaction causing immediate rash, facial/tongue/throat swelling, SOB or lightheadedness with hypotension: Yes Has patient had a PCN reaction causing severe rash involving mucus membranes or skin necrosis: No Has patient had a PCN reaction that required hospitalization No Has patient had a PCN reaction occurring within the last 10 years: Yes If all of the above answers are "NO", then may proceed with Cephalosporin use.  . Prednisone Anaphylaxis  . Sulfa Antibiotics Anaphylaxis  . Sulfasalazine Anaphylaxis  . Theophyllines Anaphylaxis  . Theophylline Swelling   DISCHARGE MEDICATIONS:   Allergies as of 03/23/2017      Reactions   Aspirin Anaphylaxis   Bee Venom Anaphylaxis   Penicillins Anaphylaxis, Other (See Comments)   Has patient had a PCN reaction causing immediate rash, facial/tongue/throat swelling, SOB or lightheadedness with hypotension: Yes Has patient had a PCN reaction causing severe rash involving mucus membranes or skin necrosis: No Has patient had a PCN reaction that required hospitalization No Has patient had a PCN reaction occurring within the  last 10 years: Yes If all of the above answers are "NO", then may proceed with Cephalosporin use.   Prednisone Anaphylaxis   Sulfa Antibiotics Anaphylaxis   Sulfasalazine Anaphylaxis   Theophyllines Anaphylaxis   Theophylline Swelling      Medication List    TAKE these medications   abacavir-dolutegravir-lamiVUDine 600-50-300 MG tablet Commonly known as:  TRIUMEQ Take 1 tablet by mouth daily after breakfast. Reported on  01/30/2016   acetaminophen 325 MG tablet Commonly known as:  TYLENOL Take 1 tablet (325 mg total) by mouth every 6 (six) hours as needed for mild pain (or Fever >/= 101).   albuterol 108 (90 Base) MCG/ACT inhaler Commonly known as:  PROVENTIL HFA;VENTOLIN HFA Inhale 2 puffs into the lungs every 6 (six) hours as needed for wheezing or shortness of breath.   albuterol (2.5 MG/3ML) 0.083% nebulizer solution Commonly known as:  PROVENTIL Take 2.5 mg by nebulization 3 (three) times daily.   atorvastatin 10 MG tablet Commonly known as:  LIPITOR Take 10 mg by mouth daily after breakfast.   CALCIUM 600+D 600-400 MG-UNIT tablet Generic drug:  Calcium Carbonate-Vitamin D Take 1 tablet by mouth daily after breakfast.   carvedilol 12.5 MG tablet Commonly known as:  COREG Take 12.5 mg by mouth 2 (two) times daily with a meal.   cetirizine 10 MG tablet Commonly known as:  ZYRTEC Take 10 mg by mouth daily as needed for allergies.   DULERA 200-5 MCG/ACT Aero Generic drug:  mometasone-formoterol Inhale 2 puffs into the lungs 3 (three) times daily.   EPIPEN 2-PAK 0.3 mg/0.3 mL Soaj injection Generic drug:  EPINEPHrine Inject 0.3 mg into the muscle once as needed (for severe allergic reaction).   escitalopram 20 MG tablet Commonly known as:  LEXAPRO Take 20 mg by mouth daily after breakfast.   fondaparinux 7.5 MG/0.6ML Soln injection Commonly known as:  ARIXTRA Inject 0.6 mLs (7.5 mg total) into the skin daily.   furosemide 20 MG tablet Commonly known as:  LASIX Take 20 mg by mouth daily.   guaiFENesin 600 MG 12 hr tablet Commonly known as:  MUCINEX Take 1 tablet (600 mg total) by mouth 2 (two) times daily. What changed:  when to take this  reasons to take this   HYDROcodone-acetaminophen 5-325 MG tablet Commonly known as:  NORCO Take 1 tablet by mouth every 4 (four) hours as needed for moderate pain.   ketorolac 0.5 % ophthalmic solution Commonly known as:  ACULAR Place  1 drop into the left eye 4 (four) times daily.   levothyroxine 50 MCG tablet Commonly known as:  SYNTHROID, LEVOTHROID Take 1 tablet (50 mcg total) by mouth daily before breakfast.   LINZESS 72 MCG capsule Generic drug:  linaclotide Take 72 mcg by mouth daily after breakfast.   losartan 25 MG tablet Commonly known as:  COZAAR Take 25 mg by mouth every morning.   magnesium oxide 400 MG tablet Commonly known as:  MAG-OX Take 400 mg by mouth daily after breakfast.   mirtazapine 15 MG tablet Commonly known as:  REMERON Take 15 mg by mouth daily.   multivitamin with minerals Tabs tablet Take 1 tablet by mouth daily. ONE-A-DAY MULTIVITAMIN 50+   nitroGLYCERIN 0.4 MG SL tablet Commonly known as:  NITROSTAT Place 0.4 mg under the tongue every 5 (five) minutes as needed for chest pain.   ofloxacin 0.3 % ophthalmic solution Commonly known as:  OCUFLOX Place 1 drop into the left eye 4 (four) times daily.  omeprazole 40 MG capsule Commonly known as:  PRILOSEC Take 40 mg by mouth daily after breakfast.   Potassium Chloride CR 8 MEQ Cpcr capsule CR Commonly known as:  MICRO-K Take 1 capsule by mouth daily after breakfast.   zolpidem 5 MG tablet Commonly known as:  AMBIEN Take 5 mg by mouth at bedtime.        DISCHARGE INSTRUCTIONS:  See AVS.  If you experience worsening of your admission symptoms, develop shortness of breath, life threatening emergency, suicidal or homicidal thoughts you must seek medical attention immediately by calling 911 or calling your MD immediately  if symptoms less severe.  You Must read complete instructions/literature along with all the possible adverse reactions/side effects for all the Medicines you take and that have been prescribed to you. Take any new Medicines after you have completely understood and accpet all the possible adverse reactions/side effects.   Please note  You were cared for by a hospitalist during your hospital stay. If you  have any questions about your discharge medications or the care you received while you were in the hospital after you are discharged, you can call the unit and asked to speak with the hospitalist on call if the hospitalist that took care of you is not available. Once you are discharged, your primary care physician will handle any further medical issues. Please note that NO REFILLS for any discharge medications will be authorized once you are discharged, as it is imperative that you return to your primary care physician (or establish a relationship with a primary care physician if you do not have one) for your aftercare needs so that they can reassess your need for medications and monitor your lab values.    On the day of Discharge:  VITAL SIGNS:  Blood pressure (!) 147/86, pulse 80, temperature 97.9 F (36.6 C), temperature source Oral, resp. rate 18, height 5\' 5"  (1.651 m), weight 185 lb 11.2 oz (84.2 kg), SpO2 95 %. PHYSICAL EXAMINATION:  GENERAL:  61 y.o.-year-old patient lying in the bed with no acute distress.  EYES: Pupils equal, round, reactive to light and accommodation. No scleral icterus. Extraocular muscles intact.  HEENT: Head atraumatic, normocephalic. Oropharynx and nasopharynx clear.  NECK:  Supple, no jugular venous distention. No thyroid enlargement, no tenderness.  LUNGS: Normal breath sounds bilaterally, no wheezing, rales,rhonchi or crepitation. No use of accessory muscles of respiration.  CARDIOVASCULAR: S1, S2 normal. No murmurs, rubs, or gallops.  ABDOMEN: Soft, non-tender, non-distended. Bowel sounds present. No organomegaly or mass.  EXTREMITIES: No pedal edema, cyanosis, or clubbing.  NEUROLOGIC: Cranial nerves II through XII are intact. Muscle strength 5/5 in all extremities. Sensation intact. Gait not checked.  PSYCHIATRIC: The patient is alert and oriented x 3.  SKIN: No obvious rash, lesion, or ulcer.  DATA REVIEW:   CBC  Recent Labs Lab 03/22/17 1230  WBC 7.4   HGB 13.8  HCT 41.1  PLT 303    Chemistries   Recent Labs Lab 03/22/17 1230  NA 142  K 4.2  CL 107  CO2 25  GLUCOSE 140*  BUN 15  CREATININE 1.31*  CALCIUM 9.5     Microbiology Results  Results for orders placed or performed during the hospital encounter of 12/21/16  Gastrointestinal Panel by PCR , Stool     Status: None   Collection Time: 12/22/16  3:41 PM  Result Value Ref Range Status   Campylobacter species NOT DETECTED NOT DETECTED Final   Plesimonas shigelloides NOT DETECTED NOT  DETECTED Final   Salmonella species NOT DETECTED NOT DETECTED Final   Yersinia enterocolitica NOT DETECTED NOT DETECTED Final   Vibrio species NOT DETECTED NOT DETECTED Final   Vibrio cholerae NOT DETECTED NOT DETECTED Final   Enteroaggregative E coli (EAEC) NOT DETECTED NOT DETECTED Final   Enteropathogenic E coli (EPEC) NOT DETECTED NOT DETECTED Final   Enterotoxigenic E coli (ETEC) NOT DETECTED NOT DETECTED Final   Shiga like toxin producing E coli (STEC) NOT DETECTED NOT DETECTED Final   Shigella/Enteroinvasive E coli (EIEC) NOT DETECTED NOT DETECTED Final   Cryptosporidium NOT DETECTED NOT DETECTED Final   Cyclospora cayetanensis NOT DETECTED NOT DETECTED Final   Entamoeba histolytica NOT DETECTED NOT DETECTED Final   Giardia lamblia NOT DETECTED NOT DETECTED Final   Adenovirus F40/41 NOT DETECTED NOT DETECTED Final   Astrovirus NOT DETECTED NOT DETECTED Final   Norovirus GI/GII NOT DETECTED NOT DETECTED Final   Rotavirus A NOT DETECTED NOT DETECTED Final   Sapovirus (I, II, IV, and V) NOT DETECTED NOT DETECTED Final  C difficile quick scan w PCR reflex     Status: None   Collection Time: 12/22/16  3:41 PM  Result Value Ref Range Status   C Diff antigen NEGATIVE NEGATIVE Final   C Diff toxin NEGATIVE NEGATIVE Final   C Diff interpretation No C. difficile detected.  Final    RADIOLOGY:  Ct Angio Chest Pe W And/or Wo Contrast  Result Date: 03/22/2017 CLINICAL DATA:  Central  chest pain EXAM: CT ANGIOGRAPHY CHEST WITH CONTRAST TECHNIQUE: Multidetector CT imaging of the chest was performed using the standard protocol during bolus administration of intravenous contrast. Multiplanar CT image reconstructions and MIPs were obtained to evaluate the vascular anatomy. CONTRAST:  75 mL Isovue 370. COMPARISON:  03/03/2017, 06/24/2015 FINDINGS: Cardiovascular: Thoracic aorta is well visualize without evidence of aneurysmal dilatation or dissection. No significant calcific changes are seen. Mild coronary calcifications are noted. The pulmonary artery shows a normal branching pattern bilaterally without definitive pulmonary embolus. Mediastinum/Nodes: The thoracic inlet is within normal limits. No significant hilar or mediastinal adenopathy is noted. Lungs/Pleura: Lungs are well aerated bilaterally. A tiny nodule is noted in the left upper lobe best seen on image number 45 of series 6. This is better visualized on the current examination when compared with the most recent study but is in retrospect stable from 2016. No focal infiltrate or sizable effusion is noted. Upper Abdomen: Upper abdomen shows no focal abnormality. Musculoskeletal: Degenerative changes of the thoracic spine are seen. Some old healed rib fractures are noted on the right changes of prior vertebral augmentation at T6 with chronic appearing T8 compression deformity is seen. Partial anterior fusion is noted at C4-5. Review of the MIP images confirms the above findings. IMPRESSION: No evidence of pulmonary emboli. No acute abnormality is noted. Chronic changes as described above. Electronically Signed   By: Alcide Clever M.D.   On: 03/22/2017 16:32   Ct Femur Left Wo Contrast  Result Date: 03/22/2017 CLINICAL DATA:  Left thigh pain for 3 days.  No known injury. EXAM: CT OF THE LOWER LEFT EXTREMITY WITHOUT CONTRAST TECHNIQUE: Multidetector CT imaging of the lower left extremity was performed according to the standard protocol.  COMPARISON:  Hila the FINDINGS: The hip and knee joints are maintained. Mild degenerative changes. No findings for hip AVN. No femur fracture or bone lesion. Bony density near the anterior inferior iliac spine on the left likely an old avulsion injury. The hip and pelvic musculature  appear normal. No obvious muscle tear or hematoma. No significant intrapelvic findings. Contrast noted in the bladder from the recent chest CT. No significant intrapelvic abnormalities. No inguinal mass or adenopathy. Small bilateral hydroceles are noted. IMPRESSION: 1. Mild hip and knee joint degenerative changes but no acute bony findings involving the left femur. 2. Unremarkable appearance of the hip and pelvic musculature. 3. No significant intrapelvic abnormalities. Electronically Signed   By: Rudie Meyer M.D.   On: 03/22/2017 19:48   US Venous Img Lower Bilateral  Result Date: 03/22/2017 CLINICAL DATA:  Recent right DVT. New onset severe left lower extremity pain. EXAM: BILATERAL LOWER EXTREMITY VENOUS DOPPLER ULTRASOUND TECHNIQUE: Gray-scale sonography with graded compression, as well as color Doppler and duplex ultrasound were performed to evaluate the lower extremity deep venous systems from the level of the common femoral vein and including the common femoral, femoral, profunda femoral, popliteal and calf veins including the posterior tibial, peroneal and gastrocnemius veins when visible. The superficial great saphenous vein was also interrogated. Spectral Doppler was utilized to evaluate flow at rest and with distal augmentation maneuvers in the common femoral, femoral and popliteal veins. COMPARISON:  March 12, 2017. FINDINGS: RIGHT LOWER EXTREMITY Common Femoral Vein: No evidence of thrombus. Normal compressibility, respiratory phasicity and response to augmentation. Saphenofemoral Junction: No evidence of thrombus. Normal compressibility and flow on color Doppler imaging. Profunda Femoral Vein: Resolved nonocclusive  thrombus. No evidence of new thrombus. Normal compressibility and flow on color Doppler imaging. Femoral Vein: Occlusive thrombus in the mid to distal femoral vein, similar to prior study. Popliteal Vein: Nonocclusive thrombus within the popliteal vein, improved when compared to prior study. Calf Veins: Resolved nonocclusive thrombus in the posterior tibial vein. No evidence of new thrombus. Normal compressibility and flow on color Doppler imaging. Superficial Great Saphenous Vein: No evidence of thrombus. Normal compressibility and flow on color Doppler imaging. Venous Reflux:  None. Other Findings:  None. LEFT LOWER EXTREMITY Common Femoral Vein: No evidence of thrombus. Normal compressibility, respiratory phasicity and response to augmentation. Saphenofemoral Junction: No evidence of thrombus. Normal compressibility and flow on color Doppler imaging. Profunda Femoral Vein: No evidence of thrombus. Normal compressibility and flow on color Doppler imaging. Femoral Vein: No evidence of thrombus. Normal compressibility, respiratory phasicity and response to augmentation. Popliteal Vein: No evidence of thrombus. Normal compressibility, respiratory phasicity and response to augmentation. Calf Veins: No evidence of thrombus. Normal compressibility and flow on color Doppler imaging. Superficial Great Saphenous Vein: No evidence of thrombus. Normal compressibility and flow on color Doppler imaging. Venous Reflux:  None. Other Findings:  None. IMPRESSION: 1. Improved clot burden within the right lower extremity, with resolved nonocclusive thrombus in the profunda femoral and posterior tibial veins. Decreased thrombus burden within the popliteal vein, now nonocclusive. Persistent occlusive thrombus within the mid to distal femoral vein. 2. No evidence of deep venous thrombosis within the left lower extremity. Electronically Signed   By: Obie Dredge M.D.   On: 03/22/2017 17:27     Management plans discussed with the  patient, family and they are in agreement.  CODE STATUS: Full Code   TOTAL TIME TAKING CARE OF THIS PATIENT: 28 minutes.    Shaune Pollack M.D on 03/23/2017 at 3:34 PM  Between 7am to 6pm - Pager - [phone]  After 6pm go to [email or web address] - password [email or web address] [facility]  [employer] [facility] [place] [facility]  Office  [phone]  CC: Primary care physician; Mick Sell, MD   Note: This dictation was prepared with  Dragon dictation along with smaller Company secretary. Any transcriptional errors that result from this process are unintentional.

## 2017-03-23 NOTE — Consult Note (Signed)
   [facility]   03/23/2017  Brian Hampton Apr 17, 1956 [id number]   [facility] for chronic disease management services.  Patient has been engaged by a [facility] [employer] Community Care Coordinator.  Our community based plan of care has focused on disease management and community resource support.  Patient will receive a post discharge transition of care call and will be evaluated for monthly home visits for assessments and disease process education.  Made Inpatient Case Manager aware that [facility] following. Of note, [facility] services does not replace or interfere with any services that are needed or arranged by inpatient case management or social work.  For additional questions or referrals please contact:  Ritchie Klee RN, BSN [facility]  Hospital Liaison  [phone]) Business Mobile [phone]) Toll free office

## 2017-03-23 NOTE — Care Management Obs Status (Signed)
MEDICARE OBSERVATION STATUS NOTIFICATION   Patient Details  Name: Brian Hampton MRN: [id number] Date of Birth: February 04, 1956   Medicare Observation Status Notification Given:  Yes    Marily MemosLisa M Guerin [place], RN 03/23/2017, 11:35 AM

## 2017-03-23 NOTE — Consult Note (Signed)
[facility] Vascular Consult Note  MRN : [id number]  Brian Hampton is a 61 y.o. (04/03/1956) male who presents with chief complaint of  Chief Complaint  Patient presents with  . Chest Pain  . Leg Pain  .  History of Present Illness: I am asked by Dr. Imogene Burn and Dr. Anne Hahn to see the patient regarding left leg pain. The patient has an extensive medical history and has a previous history of DVT and pulmonary embolus and had an IVC filter placed last year. The pain that he had last night was very severe and predominantly involved the left thigh the area. It's much better today than it was yesterday without a clear reason that it has improved. The patient reported no trauma, injury, or antecedent event that started the pain. Current chest pain and has his chronic shortness of breath without exacerbation. He had an extensive set of imaging studies performed on admission including a DVT study which showed improved clot burden in the right leg and no DVT on the left leg.  He had a CT scan femur which I have reviewed. This was a noncontrasted scan so there is really no assessment of the perfusion on the left leg, but of note there is minimal calcification of the vessels. He has no history of ulceration and infection. He does not really describe typical claudication symptoms. He has more swelling in the right leg than the left leg although the swelling is currently reasonably well controlled.  Current Facility-Administered Medications  Medication Dose Route Frequency Provider Last Rate Last Dose  . abacavir-dolutegravir-lamiVUDine (TRIUMEQ) 600-50-300 MG per tablet 1 tablet  1 tablet Oral QPC breakfast Oralia Manis, MD   1 tablet at 03/23/17 [phone]  . acetaminophen (TYLENOL) tablet 650 mg  650 mg Oral Q6H PRN Oralia Manis, MD       Or  . acetaminophen (TYLENOL) suppository 650 mg  650 mg Rectal Q6H PRN Oralia Manis, MD      . carvedilol (COREG) tablet 12.5 mg  12.5 mg Oral BID WC  Oralia Manis, MD   12.5 mg at 03/23/17 0745  . escitalopram (LEXAPRO) tablet 20 mg  20 mg Oral QPC breakfast Oralia Manis, MD   20 mg at 03/23/17 0956  . fondaparinux (ARIXTRA) injection 7.5 mg  7.5 mg Subcutaneous Q24H Oralia Manis, MD   7.5 mg at 03/23/17 1191  . furosemide (LASIX) tablet 20 mg  20 mg Oral Daily Oralia Manis, MD   20 mg at 03/23/17 0929  . HYDROcodone-acetaminophen (NORCO/VICODIN) 5-325 MG per tablet 1 tablet  1 tablet Oral Q4H PRN Oralia Manis, MD   1 tablet at 03/23/17 1258  . ketorolac (ACULAR) 0.5 % ophthalmic solution 1 drop  1 drop Left Eye QID Oralia Manis, MD   1 drop at 03/23/17 1258  . levothyroxine (SYNTHROID, LEVOTHROID) tablet 50 mcg  50 mcg Oral QAC breakfast Oralia Manis, MD   50 mcg at 03/23/17 0745  . losartan (COZAAR) tablet 25 mg  25 mg Oral q morning - 10a Oralia Manis, MD   25 mg at 03/23/17 4782  . mometasone-formoterol (DULERA) 200-5 MCG/ACT inhaler 2 puff  2 puff Inhalation TID Oralia Manis, MD   2 puff at 03/23/17 [phone]  . ofloxacin (OCUFLOX) 0.3 % ophthalmic solution 1 drop  1 drop Left Eye QID Oralia Manis, MD   1 drop at 03/23/17 1258  . ondansetron (ZOFRAN) tablet 4 mg  4 mg Oral Q6H PRN Oralia Manis, MD  Or  . ondansetron (ZOFRAN) injection 4 mg  4 mg Intravenous Q6H PRN Oralia Manis, MD      . pantoprazole (PROTONIX) EC tablet 40 mg  40 mg Oral Daily Oralia Manis, MD   40 mg at 03/23/17 1610    Past Medical History:  Diagnosis Date  . Anemia   . Asthma   . Cataracts, bilateral    worse in Rt eye  . Collagen vascular disease (HCC)   . COPD (chronic obstructive pulmonary disease) (HCC)   . Coronary artery disease   . Depression   . DVT (deep venous thrombosis) (HCC)   . Dysrhythmia   . Emphysema/COPD (HCC)   . Environmental and seasonal allergies   . H/O blood clots   . Heart murmur   . HIV (human immunodeficiency virus infection) (HCC)   . Hypertension   . Leaky heart valve    x 3  . Lung mass   . Myocardial  infarction (HCC)    in 2000  . Pneumonia    year ago  . Pulmonary emboli (HCC)   . Type 2 diabetes mellitus (HCC)     Past Surgical History:  Procedure Laterality Date  . ANKLE ARTHROSCOPY    . IVC FILTER INSERTION    . KYPHOPLASTY N/A 12/21/2016   Procedure: KYPHOPLASTY;  Surgeon: Kennedy Bucker, MD;  Location: [facility] ORS;  Service: Orthopedics;  Laterality: N/A;  . PERIPHERAL VASCULAR CATHETERIZATION N/A 03/16/2016   Procedure: IVC Filter Insertion;  Surgeon: Renford Dills, MD;  Location: [facility] INVASIVE CV LAB;  Service: Cardiovascular;  Laterality: N/A;  . SINUS EXPLORATION    . WRIST ARTHROSCOPY Right     Social History Social History  Substance Use Topics  . Smoking status: Former Smoker    Quit date: 12/16/2001  . Smokeless tobacco: Never Used  . Alcohol use No  No IVDU  Family History Family History  Problem Relation Age of Onset  . CAD Unknown   . Asthma Mother   . Cirrhosis Father   . Deep vein thrombosis Neg Hx   . Diabetes Neg Hx     Allergies  Allergen Reactions  . Aspirin Anaphylaxis  . Bee Venom Anaphylaxis  . Penicillins Anaphylaxis and Other (See Comments)    Has patient had a PCN reaction causing immediate rash, facial/tongue/throat swelling, SOB or lightheadedness with hypotension: Yes Has patient had a PCN reaction causing severe rash involving mucus membranes or skin necrosis: No Has patient had a PCN reaction that required hospitalization No Has patient had a PCN reaction occurring within the last 10 years: Yes If all of the above answers are "NO", then may proceed with Cephalosporin use.  . Prednisone Anaphylaxis  . Sulfa Antibiotics Anaphylaxis  . Sulfasalazine Anaphylaxis  . Theophyllines Anaphylaxis  . Theophylline Swelling     REVIEW OF SYSTEMS (Negative unless checked)  Constitutional: [] Weight loss  [] Fever  [] Chills Cardiac: [] Chest pain   [] Chest pressure   [] Palpitations   [] Shortness of breath when laying flat   [] Shortness of  breath at rest   [x] Shortness of breath with exertion. Vascular:  [] Pain in legs with walking   [x] Pain in legs at rest   [] Pain in legs when laying flat   [] Claudication   [] Pain in feet when walking  [] Pain in feet at rest  [] Pain in feet when laying flat   [x] History of DVT   [] Phlebitis   [x] Swelling in legs   [] Varicose veins   [] Non-healing ulcers Pulmonary:   [] Uses  home oxygen   [] Productive cough   [] Hemoptysis   [] Wheeze  [x] COPD   [x] Asthma Neurologic:  [] Dizziness  [] Blackouts   [] Seizures   [] History of stroke   [] History of TIA  [] Aphasia   [] Temporary blindness   [] Dysphagia   [] Weakness or numbness in arms   [x] Weakness or numbness in legs Musculoskeletal:  [x] Arthritis   [] Joint swelling   [x] Joint pain   [] Low back pain Hematologic:  [] Easy bruising  [] Easy bleeding   [] Hypercoagulable state   [] Anemic  [] Hepatitis Gastrointestinal:  [] Blood in stool   [] Vomiting blood  [] Gastroesophageal reflux/heartburn   [] Difficulty swallowing. Genitourinary:  [x] Chronic kidney disease   [] Difficult urination  [] Frequent urination  [] Burning with urination   [] Blood in urine Skin:  [] Rashes   [] Ulcers   [] Wounds Psychological:  [] History of anxiety   []  History of major depression.  Physical Examination  Vitals:   03/22/17 2300 03/22/17 2333 03/22/17 2352 03/23/17 0729  BP: 126/84 126/84 (!) 153/87 (!) 147/86  Pulse: 75 78 86 80  Resp: 20 17 19 18   Temp:   98.7 F (37.1 C) 97.9 F (36.6 C)  TempSrc:   Oral Oral  SpO2: 97% 99% 100% 95%  Weight:   84.2 kg (185 lb 11.2 oz)   Height:   5\' 5"  (1.651 m)    Body mass index is 30.9 kg/m. Gen:  WD/WN, NAD Head: [place]/AT, No temporalis wasting.  Ear/Nose/Throat: Hearing grossly intact, nares w/o erythema or drainage, oropharynx w/o Erythema/Exudate Eyes: Sclera non-icteric, conjunctiva clear Neck: Trachea midline.  No JVD.  Pulmonary:  Good air movement, respirations not labored, equal bilaterally.  Cardiac: RRR, normal S1, S2. Vascular:   Vessel Right Left  Radial Palpable Palpable                          PT 1+ Palpable 1+ Palpable  DP 1+ Palpable 2+ Palpable   Gastrointestinal: soft, non-tender/non-distended.   Musculoskeletal: M/S 5/5 throughout.  Prominent varicosities throughout the right lower extremity with scattered varicosities on the left lower extremity. Right lower extremity edema is 1-2+. Left lower extremity edema  Minimal. Good capillary refill. Neurologic: Sensation grossly intact in extremities.  Symmetrical.  Speech is fluent. Motor exam as listed above. Psychiatric: Judgment intact, Mood & affect appropriate for pt's clinical situation. Dermatologic: No rashes or ulcers noted.  No cellulitis or open wounds.       CBC Lab Results  Component Value Date   WBC 7.4 03/22/2017   HGB 13.8 03/22/2017   HCT 41.1 03/22/2017   MCV 99.5 03/22/2017   PLT 303 03/22/2017    BMET    Component Value Date/Time   NA 142 03/22/2017 1230   NA 145 10/02/2011 0913   K 4.2 03/22/2017 1230   K 3.9 10/02/2011 0913   CL 107 03/22/2017 1230   CL 111 (H) 10/02/2011 0913   CO2 25 03/22/2017 1230   CO2 22 10/02/2011 0913   GLUCOSE 140 (H) 03/22/2017 1230   GLUCOSE 138 (H) 10/02/2011 0913   BUN 15 03/22/2017 1230   BUN 16 10/02/2011 0913   CREATININE 1.31 (H) 03/22/2017 1230   CREATININE 0.97 10/02/2011 0913   CALCIUM 9.5 03/22/2017 1230   CALCIUM 8.8 10/02/2011 0913   GFRNONAA 58 (L) 03/22/2017 1230   GFRNONAA >60 10/02/2011 0913   GFRAA >60 03/22/2017 1230   GFRAA >60 10/02/2011 0913   Estimated Creatinine Clearance: 60.6 mL/min (A) (by C-G formula based on SCr  of 1.31 mg/dL (H)).  COAG Lab Results  Component Value Date   INR 0.98 03/12/2017   INR 1.07 03/03/2017   INR 3.04 02/25/2017    Radiology Ct Abdomen Pelvis Wo Contrast  Result Date: 03/04/2017 CLINICAL DATA:  Diffuse abdominal pain, nausea and vomiting for the past week. EXAM: CT ABDOMEN AND PELVIS WITHOUT CONTRAST TECHNIQUE:  Multidetector CT imaging of the abdomen and pelvis was performed following the standard protocol without IV contrast. COMPARISON:  12/22/2016. FINDINGS: Lower chest: Minimal bilateral dependent atelectasis. Hepatobiliary: No focal liver abnormality is seen. No gallstones, gallbladder wall thickening, or biliary dilatation. Pancreas: Unremarkable. No pancreatic ductal dilatation or surrounding inflammatory changes. Spleen: Normal in size without focal abnormality. Adrenals/Urinary Tract: Adrenal glands are unremarkable. Kidneys are normal, without renal calculi, focal lesion, or hydronephrosis. Bladder is unremarkable. Stomach/Bowel: Stomach is within normal limits. Appendix appears normal. No evidence of bowel wall thickening, distention, or inflammatory changes. Vascular/Lymphatic: Mild iliac femoral artery calcifications without aneurysm. Inferior vena cava filter. No enlarged lymph nodes. Reproductive: Prostate is unremarkable. Other: Small amount of subcutaneous air inferiorly on the right, compatible with recent injection. Small umbilical hernia containing fat. Musculoskeletal: Old, healed right rib fractures. Lumbar and lower thoracic spine degenerative changes. IMPRESSION: No acute abnormality. Electronically Signed   By: Beckie Salts M.D.   On: 03/04/2017 18:30   Ct Angio Head W Or Wo Contrast  Result Date: 03/13/2017 CLINICAL DATA:  61 y/o  M; 1 hour of right-sided numbness. EXAM: CT ANGIOGRAPHY HEAD TECHNIQUE: Multidetector CT imaging of the head was performed using the standard protocol during bolus administration of intravenous contrast. Multiplanar CT image reconstructions and MIPs were obtained to evaluate the vascular anatomy. CONTRAST:  75 cc Isovue 370 COMPARISON:  01/30/2016 MRI of the head.  03/13/2017 CT of the head. FINDINGS: CTA HEAD Anterior circulation: No significant stenosis, proximal occlusion, aneurysm, or vascular malformation. Posterior circulation: No significant stenosis, proximal  occlusion, aneurysm, or vascular malformation. Venous sinuses: As permitted by contrast timing, patent. Anatomic variants: Bilateral fetal PCA. Small caliber vertebrobasilar system with similar small flow voids on the prior MRI of the brain, likely anatomic variant due to bilateral fetal PCA. Delayed phase: No abnormal intracranial enhancement. IMPRESSION: Patent circle of Willis. No large vessel occlusion, aneurysm, or significant stenosis. Variant anatomy with bilateral fetal PCA and small caliber vertebrobasilar system. Electronically Signed   By: [name] M.D.   On: 03/13/2017 16:38   Dg Chest 2 View  Result Date: 03/22/2017 CLINICAL DATA:  Central chest pain. EXAM: CHEST  2 VIEW COMPARISON:  03/13/2017 FINDINGS: Heart and mediastinal contours are within normal limits. No focal opacities or effusions. No acute bony abnormality. IMPRESSION: No active cardiopulmonary disease. Electronically Signed   By: Charlett Nose M.D.   On: 03/22/2017 13:32   Dg Chest 2 View  Result Date: 03/13/2017 CLINICAL DATA:  Former smoker hx of COPD, asthma, CAD admitted with multiple blood clots (per patient). EXAM: CHEST  2 VIEW COMPARISON:  Chest x-ray dated 11/26/2016. Chest CT dated 03/03/2017. FINDINGS: Heart size and mediastinal contours are within normal limits. Lungs appear clear. No pleural effusion or pneumothorax seen. Stable mild compression deformity of a mid thoracic vertebral body. No acute or suspicious osseous finding seen. IVC filter noted in the upper abdomen. IMPRESSION: No active cardiopulmonary disease. No evidence of pneumonia or pulmonary edema. Electronically Signed   By: Bary Richard M.D.   On: 03/13/2017 11:27   Ct Head Wo Contrast  Result Date: 03/13/2017 CLINICAL DATA:  Right sided numbness x 1 hour ago EXAM: CT HEAD WITHOUT CONTRAST TECHNIQUE: Contiguous axial images were obtained from the base of the skull through the vertex without intravenous contrast. COMPARISON:  02/01/2016  FINDINGS: Brain: There is mild central and cortical atrophy. Mild periventricular white matter changes are consistent with small vessel disease. There is no intra or extra-axial fluid collection or mass lesion. The basilar cisterns and ventricles have a normal appearance. There is no CT evidence for acute infarction or hemorrhage. Vascular: No hyperdense vessel or unexpected calcification. Skull: No acute abnormality. Sinuses/Orbits: No acute finding. Other: None IMPRESSION: 1.  No evidence for acute  abnormality. 2. Mild atrophy and small vessel disease. Electronically Signed   By: Norva Pavlov M.D.   On: 03/13/2017 16:03   Ct Angio Chest Pe W And/or Wo Contrast  Result Date: 03/22/2017 CLINICAL DATA:  Central chest pain EXAM: CT ANGIOGRAPHY CHEST WITH CONTRAST TECHNIQUE: Multidetector CT imaging of the chest was performed using the standard protocol during bolus administration of intravenous contrast. Multiplanar CT image reconstructions and MIPs were obtained to evaluate the vascular anatomy. CONTRAST:  75 mL Isovue 370. COMPARISON:  03/03/2017, 06/24/2015 FINDINGS: Cardiovascular: Thoracic aorta is well visualize without evidence of aneurysmal dilatation or dissection. No significant calcific changes are seen. Mild coronary calcifications are noted. The pulmonary artery shows a normal branching pattern bilaterally without definitive pulmonary embolus. Mediastinum/Nodes: The thoracic inlet is within normal limits. No significant hilar or mediastinal adenopathy is noted. Lungs/Pleura: Lungs are well aerated bilaterally. A tiny nodule is noted in the left upper lobe best seen on image number 45 of series 6. This is better visualized on the current examination when compared with the most recent study but is in retrospect stable from 2016. No focal infiltrate or sizable effusion is noted. Upper Abdomen: Upper abdomen shows no focal abnormality. Musculoskeletal: Degenerative changes of the thoracic spine are  seen. Some old healed rib fractures are noted on the right changes of prior vertebral augmentation at T6 with chronic appearing T8 compression deformity is seen. Partial anterior fusion is noted at C4-5. Review of the MIP images confirms the above findings. IMPRESSION: No evidence of pulmonary emboli. No acute abnormality is noted. Chronic changes as described above. Electronically Signed   By: Alcide Clever M.D.   On: 03/22/2017 16:32   Ct Angio Chest Pe W Or Wo Contrast  Result Date: 03/03/2017 CLINICAL DATA:  Severe chest pain history of multiple DVT EXAM: CT ANGIOGRAPHY CHEST WITH CONTRAST TECHNIQUE: Multidetector CT imaging of the chest was performed using the standard protocol during bolus administration of intravenous contrast. Multiplanar CT image reconstructions and MIPs were obtained to evaluate the vascular anatomy. CONTRAST:  75 mL Isovue 370 intravenous COMPARISON:  02/25/2017, ultrasound 02/25/2017, CT chest 11/26/2016 FINDINGS: Cardiovascular: Satisfactory opacification of the pulmonary arteries to the segmental level. No evidence of pulmonary embolism. Linear web like defect within left lower lobe segmental arterial branches unchanged. Non aneurysmal aorta. Common origin of brachiocephalic and left common carotid vessels. No aneurysmal dilatation. Coronary artery calcification. Normal heart size. No pericardial effusion. Mediastinum/Nodes: No enlarged mediastinal, hilar, or axillary lymph nodes. Thyroid gland, trachea, and esophagus demonstrate no significant findings. Lungs/Pleura: Lungs are clear. No pleural effusion or pneumothorax. Upper Abdomen: No acute abnormality. Musculoskeletal: Post augmentation changes at T6. Partial anterior fusion at T4-T5. Chronic superior endplate deformity at T8. Old right rib fractures. Review of the MIP images confirms the above findings. IMPRESSION: 1. No acute pulmonary embolus, aneurysm or dissection is seen  2. Clear lung fields Electronically Signed   By:  Jasmine Pang M.D.   On: 03/03/2017 21:37   Ct Angio Chest Pe W And/or Wo Contrast  Result Date: 02/25/2017 CLINICAL DATA:  Right leg DVT.  Chest pain and dyspnea x1 week. EXAM: CT ANGIOGRAPHY CHEST WITH CONTRAST TECHNIQUE: Multidetector CT imaging of the chest was performed using the standard protocol during bolus administration of intravenous contrast. Multiplanar CT image reconstructions and MIPs were obtained to evaluate the vascular anatomy. CONTRAST:  75 cc Isovue 370 IV COMPARISON:  11/26/2016 CT FINDINGS: Cardiovascular: Satisfactory opacification of the pulmonary arteries to the subsegmental level. No evidence of pulmonary embolism. Normal heart size. Coronary arterial calcifications along the LAD. No pericardial effusion. No aortic aneurysm or dissection. Two vessel takeoff the aortic arch with common origin of the right brachiocephalic and left common carotid arteries. Mediastinum/Nodes: No enlarged mediastinal, hilar, or axillary lymph nodes. Thyroid gland, trachea, and esophagus demonstrate no significant findings. Lungs/Pleura: Lungs demonstrate dependent atelectasis bilaterally. Tiny subpleural bleb in the right upper lobe with a few scattered bowel tiny nonspecific subpleural densities possibly representing chronic postinfectious or postinflammatory change or atelectasis. No pleural effusion or pneumothorax. Upper Abdomen: No acute abnormality.  Tip of an IVC filter is seen. Musculoskeletal: No chest wall abnormality. No acute or significant osseous findings. Old left-sided healed rib fractures. Old T6 fracture with augmentation and chronic moderate T8 compression fractures. T12-L1 degenerative disc disease with vacuum disc. Review of the MIP images confirms the above findings. IMPRESSION: 1. No acute pulmonary embolus or aortic aneurysm.  No dissection. 2. Coronary arteriosclerosis. 3. No active cardiopulmonary disease. Electronically Signed   By: Tollie Eth M.D.   On: 02/25/2017 20:57   Mr  Maxine Glenn Head Wo Contrast  Result Date: 03/14/2017 CLINICAL DATA:  61 y/o M; increasing weakness in the right upper and lower extremities. EXAM: MRI HEAD WITHOUT CONTRAST MRA HEAD WITHOUT CONTRAST TECHNIQUE: Multiplanar, multiecho pulse sequences of the brain and surrounding structures were obtained without intravenous contrast. Angiographic images of the head were obtained using MRA technique without contrast. COMPARISON:  03/13/2017 CT angiogram of the head. FINDINGS: MRI HEAD FINDINGS Brain: No acute infarction, hemorrhage, hydrocephalus, extra-axial collection or mass lesion. Several nonspecific foci of T2 FLAIR hyperintense signal abnormality in subcortical and periventricular white matter is compatible with moderate chronic microvascular ischemic changes for age. Mild brain parenchymal volume loss. Vascular: As below. Skull and upper cervical spine: Mild cranial settling likely related to history of collagen vascular disease, no significant stenosis of the foramen magnum. Right frontal bone T1 hyperintense focus compatible with hemangioma. Sinuses/Orbits: Post bilateral maxillary antrostomy and ethmoidectomy. No significant paranasal sinus mucosal thickening. No abnormal signal of mastoid air cells. Bilateral intra-ocular lens replacement. Other: None. MRA HEAD FINDINGS Internal carotid arteries:  Patent. Anterior cerebral arteries:  Patent. Middle cerebral arteries: Patent. Anterior communicating artery: Motion degraded. Posterior communicating arteries:  Patent. Posterior cerebral arteries:  Patent.  Bilateral fetal PCA. Basilar artery:  Patent.  Small in caliber. Vertebral arteries:  Patent.  Small in caliber. Motion degraded study, suboptimal assessment for subtle stenosis or small 2-3 mm aneurysm. IMPRESSION: MRI head: 1. No acute intracranial abnormality. 2. Moderate for age chronic microvascular ischemic changes and mild parenchymal volume loss of the brain. 3. Cranial settling likely related to collagen  vascular disease, no significant stenosis of foramen magnum. MRA head: 1. Motion degraded study, suboptimal assessment for subtle stenosis or small aneurysm. 2. Patent circle of Willis. No large vessel occlusion, aneurysm, or  significant stenosis is identified. Electronically Signed   By: Mitzi HansenLance  Furusawa-Stratton M.D.   On: 03/14/2017 16:11   Mr Brain Wo Contrast  Result Date: 03/14/2017 CLINICAL DATA:  61 y/o M; increasing weakness in the right upper and lower extremities. EXAM: MRI HEAD WITHOUT CONTRAST MRA HEAD WITHOUT CONTRAST TECHNIQUE: Multiplanar, multiecho pulse sequences of the brain and surrounding structures were obtained without intravenous contrast. Angiographic images of the head were obtained using MRA technique without contrast. COMPARISON:  03/13/2017 CT angiogram of the head. FINDINGS: MRI HEAD FINDINGS Brain: No acute infarction, hemorrhage, hydrocephalus, extra-axial collection or mass lesion. Several nonspecific foci of T2 FLAIR hyperintense signal abnormality in subcortical and periventricular white matter is compatible with moderate chronic microvascular ischemic changes for age. Mild brain parenchymal volume loss. Vascular: As below. Skull and upper cervical spine: Mild cranial settling likely related to history of collagen vascular disease, no significant stenosis of the foramen magnum. Right frontal bone T1 hyperintense focus compatible with hemangioma. Sinuses/Orbits: Post bilateral maxillary antrostomy and ethmoidectomy. No significant paranasal sinus mucosal thickening. No abnormal signal of mastoid air cells. Bilateral intra-ocular lens replacement. Other: None. MRA HEAD FINDINGS Internal carotid arteries:  Patent. Anterior cerebral arteries:  Patent. Middle cerebral arteries: Patent. Anterior communicating artery: Motion degraded. Posterior communicating arteries:  Patent. Posterior cerebral arteries:  Patent.  Bilateral fetal PCA. Basilar artery:  Patent.  Small in caliber.  Vertebral arteries:  Patent.  Small in caliber. Motion degraded study, suboptimal assessment for subtle stenosis or small 2-3 mm aneurysm. IMPRESSION: MRI head: 1. No acute intracranial abnormality. 2. Moderate for age chronic microvascular ischemic changes and mild parenchymal volume loss of the brain. 3. Cranial settling likely related to collagen vascular disease, no significant stenosis of foramen magnum. MRA head: 1. Motion degraded study, suboptimal assessment for subtle stenosis or small aneurysm. 2. Patent circle of Willis. No large vessel occlusion, aneurysm, or significant stenosis is identified. Electronically Signed   By: Mitzi HansenLance  Furusawa-Stratton M.D.   On: 03/14/2017 16:11   [place] Abdomen Complete  Result Date: 03/13/2017 CLINICAL DATA:  Nonspecific abdominal pain beginning at 1 p.m. EXAM: ABDOMEN ULTRASOUND COMPLETE COMPARISON:  CT abdomen and pelvis May 05, 2017 FINDINGS: Limited examination due to body habitus and, bowel gas. Gallbladder: Contracted resulting in gallbladder wall thickening. No cholelithiasis. No sonographic Murphy sign noted by sonographer. Common bile duct: Diameter: 3 mm. Liver: No focal lesion identified. Within normal limits in parenchymal echogenicity. Hepatopetal portal vein. IVC: No abnormality visualized. Pancreas: Predominately obscured. Spleen: Size and appearance within normal limits. Right Kidney: Length: 9.7 cm. Echogenicity within normal limits. No mass or hydronephrosis visualized. Left Kidney: Length: 10.5 cm. Echogenicity within normal limits. No mass or hydronephrosis visualized. Abdominal aorta: No aneurysm visualized.  Distal aorta obscured. Other findings: None. IMPRESSION: No acute abdominal process. Electronically Signed   By: Awilda Metroourtnay  Bloomer M.D.   On: 03/13/2017 20:32   Ct Femur Left Wo Contrast  Result Date: 03/22/2017 CLINICAL DATA:  Left thigh pain for 3 days.  No known injury. EXAM: CT OF THE LOWER LEFT EXTREMITY WITHOUT CONTRAST TECHNIQUE:  Multidetector CT imaging of the lower left extremity was performed according to the standard protocol. COMPARISON:  Hila the FINDINGS: The hip and knee joints are maintained. Mild degenerative changes. No findings for hip AVN. No femur fracture or bone lesion. Bony density near the anterior inferior iliac spine on the left likely an old avulsion injury. The hip and pelvic musculature appear normal. No obvious muscle tear or hematoma. No significant  intrapelvic findings. Contrast noted in the bladder from the recent chest CT. No significant intrapelvic abnormalities. No inguinal mass or adenopathy. Small bilateral hydroceles are noted. IMPRESSION: 1. Mild hip and knee joint degenerative changes but no acute bony findings involving the left femur. 2. Unremarkable appearance of the hip and pelvic musculature. 3. No significant intrapelvic abnormalities. Electronically Signed   By: Rudie Meyer M.D.   On: 03/22/2017 19:48   US Carotid Bilateral  Result Date: 03/14/2017 CLINICAL DATA:  61 year old male with symptoms of transient ischemic attack EXAM: BILATERAL CAROTID DUPLEX ULTRASOUND TECHNIQUE: Wallace Cullens scale imaging, color Doppler and duplex ultrasound were performed of bilateral carotid and vertebral arteries in the neck. COMPARISON:  None. FINDINGS: Criteria: Quantification of carotid stenosis is based on velocity parameters that correlate the residual internal carotid diameter with NASCET-based stenosis levels, using the diameter of the distal internal carotid lumen as the denominator for stenosis measurement. The following velocity measurements were obtained: RIGHT ICA:  101/24 cm/sec CCA:  100/15 cm/sec SYSTOLIC ICA/CCA RATIO:  1.0 DIASTOLIC ICA/CCA RATIO:  1.6 ECA:  131 cm/sec LEFT ICA:  69/19 cm/sec CCA:  126/23 cm/sec SYSTOLIC ICA/CCA RATIO:  0.6 DIASTOLIC ICA/CCA RATIO:  0.8 ECA:  100 cm/sec RIGHT CAROTID ARTERY: Smooth heterogeneous atherosclerotic plaque in the proximal internal carotid artery. By peak  systolic velocity criteria, the estimated stenosis remains less than 50%. RIGHT VERTEBRAL ARTERY:  Patent with antegrade flow. LEFT CAROTID ARTERY: No focal atherosclerotic plaque or evidence of stenosis. LEFT VERTEBRAL ARTERY:  Patent with normal antegrade flow. IMPRESSION: 1. Mild (1-49%) stenosis proximal right internal carotid artery secondary to smooth heterogeneous atherosclerotic plaque. 2. No significant atherosclerotic plaque or evidence of stenosis in the left internal carotid artery. 3. Vertebral arteries are patent with normal antegrade flow. Signed, Sterling Big, MD [facility] Electronically Signed   By: Malachy Moan M.D.   On: 03/14/2017 15:08   US Venous Img Lower Bilateral  Result Date: 03/22/2017 CLINICAL DATA:  Recent right DVT. New onset severe left lower extremity pain. EXAM: BILATERAL LOWER EXTREMITY VENOUS DOPPLER ULTRASOUND TECHNIQUE: Gray-scale sonography with graded compression, as well as color Doppler and duplex ultrasound were performed to evaluate the lower extremity deep venous systems from the level of the common femoral vein and including the common femoral, femoral, profunda femoral, popliteal and calf veins including the posterior tibial, peroneal and gastrocnemius veins when visible. The superficial great saphenous vein was also interrogated. Spectral Doppler was utilized to evaluate flow at rest and with distal augmentation maneuvers in the common femoral, femoral and popliteal veins. COMPARISON:  March 12, 2017. FINDINGS: RIGHT LOWER EXTREMITY Common Femoral Vein: No evidence of thrombus. Normal compressibility, respiratory phasicity and response to augmentation. Saphenofemoral Junction: No evidence of thrombus. Normal compressibility and flow on color Doppler imaging. Profunda Femoral Vein: Resolved nonocclusive thrombus. No evidence of new thrombus. Normal compressibility and flow on color Doppler  imaging. Femoral Vein: Occlusive thrombus in the mid to distal femoral vein, similar to prior study. Popliteal Vein: Nonocclusive thrombus within the popliteal vein, improved when compared to prior study. Calf Veins: Resolved nonocclusive thrombus in the posterior tibial vein. No evidence of new thrombus. Normal compressibility and flow on color Doppler imaging. Superficial Great Saphenous Vein: No evidence of thrombus. Normal compressibility and flow on color Doppler imaging. Venous Reflux:  None. Other Findings:  None. LEFT LOWER EXTREMITY Common Femoral Vein: No evidence of thrombus. Normal compressibility, respiratory phasicity and response to augmentation. Saphenofemoral Junction: No evidence  of thrombus. Normal compressibility and flow on color Doppler imaging. Profunda Femoral Vein: No evidence of thrombus. Normal compressibility and flow on color Doppler imaging. Femoral Vein: No evidence of thrombus. Normal compressibility, respiratory phasicity and response to augmentation. Popliteal Vein: No evidence of thrombus. Normal compressibility, respiratory phasicity and response to augmentation. Calf Veins: No evidence of thrombus. Normal compressibility and flow on color Doppler imaging. Superficial Great Saphenous Vein: No evidence of thrombus. Normal compressibility and flow on color Doppler imaging. Venous Reflux:  None. Other Findings:  None. IMPRESSION: 1. Improved clot burden within the right lower extremity, with resolved nonocclusive thrombus in the profunda femoral and posterior tibial veins. Decreased thrombus burden within the popliteal vein, now nonocclusive. Persistent occlusive thrombus within the mid to distal femoral vein. 2. No evidence of deep venous thrombosis within the left lower extremity. Electronically Signed   By: Obie Dredge M.D.   On: 03/22/2017 17:27   US Venous Img Lower Bilateral  Result Date: 03/12/2017 CLINICAL DATA:  Hospitalized 2 weeks ago for right leg DVT. Assess deep  venous thrombosis. Initial encounter. EXAM: BILATERAL LOWER EXTREMITY VENOUS DOPPLER ULTRASOUND TECHNIQUE: Gray-scale sonography with graded compression, as well as color Doppler and duplex ultrasound were performed to evaluate the lower extremity deep venous systems from the level of the common femoral vein and including the common femoral, femoral, profunda femoral, popliteal and calf veins including the posterior tibial, peroneal and gastrocnemius veins when visible. The superficial great saphenous vein was also interrogated. Spectral Doppler was utilized to evaluate flow at rest and with distal augmentation maneuvers in the common femoral, femoral and popliteal veins. COMPARISON:  Right lower extremity venous Doppler ultrasound performed 02/25/2017 FINDINGS: RIGHT LOWER EXTREMITY Common Femoral Vein: No evidence of thrombus. Normal compressibility, respiratory phasicity and response to augmentation. Saphenofemoral Junction: No evidence of thrombus. Normal compressibility and flow on color Doppler imaging. Profunda Femoral Vein: Nonocclusive thrombus is noted within the profunda femoral vein, new from the prior study. Femoral Vein: Occlusive thrombus is noted within the femoral vein, similar to the prior study. Popliteal Vein: Occlusive thrombus noted within the popliteal vein. Calf Veins: Nonocclusive thrombus is noted within the posterior tibial vein. The peroneal vein is patent, new from the prior study. Superficial Great Saphenous Vein: No evidence of thrombus. Normal compressibility and flow on color Doppler imaging. Venous Reflux:  None. Other Findings:  None. LEFT LOWER EXTREMITY Common Femoral Vein: No evidence of thrombus. Normal compressibility, respiratory phasicity and response to augmentation. Saphenofemoral Junction: No evidence of thrombus. Normal compressibility and flow on color Doppler imaging. Profunda Femoral Vein: No evidence of thrombus. Normal compressibility and flow on color Doppler  imaging. Femoral Vein: No evidence of thrombus. Normal compressibility, respiratory phasicity and response to augmentation. Popliteal Vein: No evidence of thrombus. Normal compressibility, respiratory phasicity and response to augmentation. Calf Veins: No evidence of thrombus. Normal compressibility and flow on color Doppler imaging. Superficial Great Saphenous Vein: No evidence of thrombus. Normal compressibility and flow on color Doppler imaging. Venous Reflux:  None. Other Findings:  None. IMPRESSION: 1. Deep venous thrombosis noted within the right profunda femoral vein, femoral vein, popliteal vein and posterior tibial vein, with occlusion at the mid to distal femoral vein and proximal popliteal vein. This has progressed slightly proximally since the prior study. 2. No evidence of DVT at the left lower extremity. These results were called by telephone at the time of interpretation on 03/12/2017 at 5:40 am to Dr. Merrily Brittle, who verbally acknowledged these results. Electronically Signed  By: Roanna Raider M.D.   On: 03/12/2017 05:42   US Venous Img Lower Unilateral Right  Result Date: 02/25/2017 CLINICAL DATA:  Initial evaluation for acute leg pain. EXAM: Right LOWER EXTREMITY VENOUS DOPPLER ULTRASOUND TECHNIQUE: Gray-scale sonography with graded compression, as well as color Doppler and duplex ultrasound were performed to evaluate the lower extremity deep venous systems from the level of the common femoral vein and including the common femoral, femoral, profunda femoral, popliteal and calf veins including the posterior tibial, peroneal and gastrocnemius veins when visible. The superficial great saphenous vein was also interrogated. Spectral Doppler was utilized to evaluate flow at rest and with distal augmentation maneuvers in the common femoral, femoral and popliteal veins. COMPARISON:  Prior ultrasound from 01/13/2006. FINDINGS: Contralateral Common Femoral Vein: Respiratory phasicity is normal and  symmetric with the symptomatic side. No evidence of thrombus. Normal compressibility. Common Femoral Vein: No evidence of thrombus. Normal compressibility, respiratory phasicity and response to augmentation. Saphenofemoral Junction: No evidence of thrombus. Normal compressibility and flow on color Doppler imaging. Profunda Femoral Vein: No evidence of thrombus. Normal compressibility and flow on color Doppler imaging. Femoral Vein: The femoral vein/SFV is duplicated. Occlusive thrombus within 1 set of the right femoral vein beginning at the proximal mid thigh. Loss of normal compressibility. Popliteal Vein: Occlusive thrombus present throughout the popliteal vein with loss of normal compressibility. Calf Veins: Occlusive thrombus within the right posterior tibial and peroneal veins with loss of normal compressibility. Superficial Great Saphenous Vein: No evidence of thrombus. Normal compressibility and flow on color Doppler imaging. IMPRESSION: Positive study for DVT with occlusive thrombus involving the right femoral and popliteal veins as well as the veins of the calf, extending from the proximal- mid right thigh through to the ankle. Critical Value/emergent results were called by telephone at the time of interpretation on 02/25/2017 at 6:14 pm to Dr. Ileana Roup , who verbally acknowledged these results. Electronically Signed   By: Rise Mu M.D.   On: 02/25/2017 18:18      Assessment/Plan 1. Left leg pain. The pain is quite atypical for vascular pain but certainly he has a litany of medical problems, atherosclerotic risk factors, and previous DVT. He could have a component of postphlebitic syndrome although that pain is usually lower in the leg and related with more swelling. His right leg a has more swelling and pain. I suspect he has significant postphlebitic swelling on the right leg as long as he is not having much pain, that is not a major issue. He should wear compression stockings to  reduce swelling. He is being discharged home today. I will be happy to see him in the office and arrange outpatient follow-up were ABIs and arterial duplex as needed. He should continue his current medications including his anticoagulant. He should increase his activity, elevate his legs, and use at least therapeutic compression stockings. 2. 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 Likely has significant postphlebitic swelling from the DVT in the right leg, but this leg is not painful currently. Recommend compression stockings and elevation 3. DM. Stable on outpatient medications and blood glucose control important in reducing the progression of atherosclerotic disease. Also, involved in wound healing. On appropriate medications. 4. HTN. Stable on outpatient medications and blood pressure control important in reducing the progression of atherosclerotic disease. On appropriate oral medications.    Festus Barren, MD  03/23/2017 3:40 PM    This note was created with Dragon medical transcription system.  Any error is  purely unintentional

## 2017-03-23 NOTE — Care Management Note (Signed)
Case Management Note  Patient Details  Name: Brian Hampton MRN: [id number] Date of Birth: Feb 27, 1956  Subjective/Objective:    Discharging today               Action/Plan: [facility] notified of discharge. Will resume SN and SW. Patient updated. Signed MOON letter.    Expected Discharge Date:  03/23/17               Expected Discharge Plan:  Home w Home Health Services  In-House Referral:     Discharge planning Services  CM Consult  Post Acute Care Choice:  Home Health, Resumption of Svcs/PTA Provider Choice offered to:  Patient  DME Arranged:    DME Agency:     HH Arranged:  RN, Social Work [employer] [facility]  Status of Service:  Completed, signed off  If discussed at [employer] Meetings, dates discussed:    Additional Comments:  Marily MemosLisa M Zailah Zagami, RN 03/23/2017, 11:33 AM

## 2017-03-23 NOTE — Discharge Instructions (Signed)
Heart healthy and ADA diet. °

## 2017-03-23 NOTE — Progress Notes (Signed)
Chaplain received an OR requisition for prayer. CH met with pt, pt spoke about his grandmother and grandfather, talked about his 69 and church, his sickness, and spoke about he community where he live, which he said is filled with unfriendly people. Pt stated that his social worker is helping him to find a house in another safe place. Pt requested for prayer for the concerns he shared with [facility], which [place] offered with a ministry of presence.   03/23/17 0900  Clinical Encounter Type  Visited With Patient;Health care provider  Visit Type Initial;Spiritual support  Referral From Nurse  Consult/Referral To Chaplain  Spiritual Encounters  Spiritual Needs Prayer

## 2017-03-24 ENCOUNTER — Encounter: Payer: Self-pay | Admitting: *Deleted

## 2017-03-24 ENCOUNTER — Other Ambulatory Visit: Payer: Self-pay | Admitting: *Deleted

## 2017-03-24 DIAGNOSIS — I129 Hypertensive chronic kidney disease with stage 1 through stage 4 chronic kidney disease, or unspecified chronic kidney disease: Secondary | ICD-10-CM | POA: Diagnosis not present

## 2017-03-24 DIAGNOSIS — N189 Chronic kidney disease, unspecified: Secondary | ICD-10-CM | POA: Diagnosis not present

## 2017-03-24 DIAGNOSIS — E1122 Type 2 diabetes mellitus with diabetic chronic kidney disease: Secondary | ICD-10-CM | POA: Diagnosis not present

## 2017-03-24 DIAGNOSIS — J439 Emphysema, unspecified: Secondary | ICD-10-CM | POA: Diagnosis not present

## 2017-03-24 DIAGNOSIS — I2510 Atherosclerotic heart disease of native coronary artery without angina pectoris: Secondary | ICD-10-CM | POA: Diagnosis not present

## 2017-03-24 DIAGNOSIS — I82401 Acute embolism and thrombosis of unspecified deep veins of right lower extremity: Secondary | ICD-10-CM | POA: Diagnosis not present

## 2017-03-24 NOTE — Patient Outreach (Signed)
[facility]) Care Management   03/24/2017  Brian Hampton 04-Dec-1955 [id number]  Brian Hampton is an 61 y.o. male  Subjective:  Pt reports on recent observation stay (view in Epic 8/14-8/15,2018)for left leg pain,chest pain- came home yesterday.  Pt reports left leg better today - currently no pain  And no further chest pain.  Pt reports to follow up with Dr. Wyn Quaker 04/04/17, continues to take  Brian Hampton, last hospitalization showed had 8 blood clots in left leg/4 in right leg/one in lung.   Pt reports already saw PCP and Heart MD, to follow up again with PCP 8/30.   Objective:   Vitals:   03/24/17 1346  BP: 98/74  Pulse: 82  Resp: (!) 24  SpO2: 97%    ROS  Physical Exam  Constitutional: He is oriented to person, place, and time. He appears well-developed and well-nourished.  Cardiovascular: Normal rate, regular rhythm and normal heart sounds.   Respiratory: Effort normal and breath sounds normal.  GI: Soft.  Musculoskeletal: Normal range of motion.  Neurological: He is alert and oriented to person, place, and time.  Skin: Skin is warm and dry.  Psychiatric: He has a normal mood and affect. His behavior is normal. Thought content normal.    Encounter Medications:   Outpatient Encounter Prescriptions as of 03/24/2017  Medication Sig Note  . abacavir-dolutegravir-lamiVUDine (TRIUMEQ) 600-50-300 MG tablet Take 1 tablet by mouth daily after breakfast. Reported on 01/30/2016   . acetaminophen (TYLENOL) 325 MG tablet Take 1 tablet (325 mg total) by mouth every 6 (six) hours as needed for mild pain (or Fever >/= 101). 03/17/2017: As needed.   Marland Kitchen albuterol (PROVENTIL HFA;VENTOLIN HFA) 108 (90 BASE) MCG/ACT inhaler Inhale 2 puffs into the lungs every 6 (six) hours as needed for wheezing or shortness of breath. 03/17/2017: As needed.   Marland Kitchen albuterol (PROVENTIL) (2.5 MG/3ML) 0.083% nebulizer solution Take 2.5 mg by nebulization 3 (three) times daily.    Marland Kitchen atorvastatin (LIPITOR) 10 MG  tablet Take 10 mg by mouth daily after breakfast.   . Calcium Carbonate-Vitamin D (CALCIUM 600+D) 600-400 MG-UNIT tablet Take 1 tablet by mouth daily after breakfast.    . carvedilol (COREG) 12.5 MG tablet Take 12.5 mg by mouth 2 (two) times daily with a meal.   . cetirizine (ZYRTEC) 10 MG tablet Take 10 mg by mouth daily as needed for allergies.  03/17/2017: As needed.   Marland Kitchen EPINEPHrine (EPIPEN 2-PAK) 0.3 mg/0.3 mL IJ SOAJ injection Inject 0.3 mg into the muscle once as needed (for severe allergic reaction). 03/17/2017: Available if needed.   Marland Kitchen escitalopram (LEXAPRO) 20 MG tablet Take 20 mg by mouth daily after breakfast.    . fondaparinux (ARIXTRA) 7.5 MG/0.6ML SOLN injection Inject 0.6 mLs (7.5 mg total) into the skin daily.   . furosemide (LASIX) 20 MG tablet Take 20 mg by mouth daily.    Marland Kitchen guaiFENesin (MUCINEX) 600 MG 12 hr tablet Take 1 tablet (600 mg total) by mouth 2 (two) times daily. (Patient taking differently: Take 600 mg by mouth 2 (two) times daily as needed. )   . HYDROcodone-acetaminophen (NORCO) 5-325 MG tablet Take 1 tablet by mouth every 4 (four) hours as needed for moderate pain. (Patient not taking: Reported on 03/17/2017) 03/17/2017: Available if needed.   Marland Kitchen ketorolac (ACULAR) 0.5 % ophthalmic solution Place 1 drop into the left eye 4 (four) times daily.   Marland Kitchen levothyroxine (SYNTHROID, LEVOTHROID) 50 MCG tablet Take 1 tablet (50 mcg total) by  mouth daily before breakfast.   . LINZESS 72 MCG capsule Take 72 mcg by mouth daily after breakfast.   . losartan (COZAAR) 25 MG tablet Take 25 mg by mouth every morning.   . magnesium oxide (MAG-OX) 400 MG tablet Take 400 mg by mouth daily after breakfast.   . mirtazapine (REMERON) 15 MG tablet Take 15 mg by mouth daily.    . mometasone-formoterol (DULERA) 200-5 MCG/ACT AERO Inhale 2 puffs into the lungs 3 (three) times daily.    . Multiple Vitamin (MULTIVITAMIN WITH MINERALS) TABS tablet Take 1 tablet by mouth daily. ONE-A-DAY MULTIVITAMIN 50+   .  nitroGLYCERIN (NITROSTAT) 0.4 MG SL tablet Place 0.4 mg under the tongue every 5 (five) minutes as needed for chest pain. 03/17/2017: Available if needed.   Marland Kitchen ofloxacin (OCUFLOX) 0.3 % ophthalmic solution Place 1 drop into the left eye 4 (four) times daily.   Marland Kitchen omeprazole (PRILOSEC) 40 MG capsule Take 40 mg by mouth daily after breakfast.   . Potassium Chloride CR (MICRO-K) 8 MEQ CPCR capsule CR Take 1 capsule by mouth daily after breakfast.   . zolpidem (AMBIEN) 5 MG tablet Take 5 mg by mouth at bedtime. 03/17/2017: As needed.    No facility-administered encounter medications on file as of 03/24/2017.     Functional Status:   In your present state of health, do you have any difficulty performing the following activities: 03/24/2017 03/22/2017  Hearing? N N  Vision? N N  Difficulty concentrating or making decisions? N N  Walking or climbing stairs? Y Y  Comment uses quad cane  -  Dressing or bathing? N Y  Doing errands, shopping? Y N  Preparing Food and eating ? N -  Using the Toilet? N -  In the past six months, have you accidently leaked urine? N -  Do you have problems with loss of bowel control? N -  Managing your Medications? N -  Managing your Finances? N -  Housekeeping or managing your Housekeeping? N -  Some recent data might be hidden    Fall/Depression Screening:    Fall Risk  03/24/2017  Falls in the past year? No   PHQ 2/9 Scores 03/24/2017  PHQ - 2 Score 0    Assessment:  Pleasant 61 year old male, resides alone in rooming house- SW working   with him on getting in [place] housing (assist with application).   This RN CM following   pt for transition of care/recent hospitalization 8/4-03/15/17 for DVT, RLE weakness.  History   includes but not limited to COPD,Hypertension, MI, DM 2.     No edema noted in bilateral lower legs, no complaints of pain.    COPD:  Lungs clear.  No complaints of sob.  O 2 sat at rest 97%.  Per pt compliant with       Respiratory medications.      Plan: As discussed with pt, plan to continue to follow for transition of care, follow up        Again next week telephonically.            Plan to send Dr. Clydie Braun home visit encounter.   [facility] CM Care Plan Problem One     Most Recent Value  Care Plan Problem One  Risk for readmission related to recent hospitalization for DVT RLE, weakness.   Role Documenting the Problem One  Care Management Coordinator  Care Plan for Problem One  Active  [facility] Long Term Goal  Pt would not readmit to the hospital in the next 31 days   [facility] Long Term Goal Start Date  03/17/17  Interventions for Problem One Long Term Goal  home visit done- f/u on recent observation stay (leg and chest pain), MD follow up appointment   [facility]   Pt would keep all MD appointments in the next 30 days   [facility] CM Short Term Goal #1 Start Date  03/17/17  Interventions for Short Term Goal #1  Discussed with pt recent MD appointments (PCP, Heart MD), upcoming - Dr. Wyn Quakerew (vascular MD).   [facility] CM Short Term Goal #2   Pt would take all medications as ordered for the next 30 days   [facility] CM Short Term Goal #2 Start Date  03/17/17  Interventions for Short Term Goal #2  Reviewed pt's medications during home visit, discussed adherence.      Shayne Alkenose M.   Pierzchala RN CCM [facility]  [phone]

## 2017-03-25 DIAGNOSIS — I129 Hypertensive chronic kidney disease with stage 1 through stage 4 chronic kidney disease, or unspecified chronic kidney disease: Secondary | ICD-10-CM | POA: Diagnosis not present

## 2017-03-25 DIAGNOSIS — I2510 Atherosclerotic heart disease of native coronary artery without angina pectoris: Secondary | ICD-10-CM | POA: Diagnosis not present

## 2017-03-25 DIAGNOSIS — E1122 Type 2 diabetes mellitus with diabetic chronic kidney disease: Secondary | ICD-10-CM | POA: Diagnosis not present

## 2017-03-25 DIAGNOSIS — I82401 Acute embolism and thrombosis of unspecified deep veins of right lower extremity: Secondary | ICD-10-CM | POA: Diagnosis not present

## 2017-03-25 DIAGNOSIS — N189 Chronic kidney disease, unspecified: Secondary | ICD-10-CM | POA: Diagnosis not present

## 2017-03-25 DIAGNOSIS — J439 Emphysema, unspecified: Secondary | ICD-10-CM | POA: Diagnosis not present

## 2017-03-28 ENCOUNTER — Telehealth (INDEPENDENT_AMBULATORY_CARE_PROVIDER_SITE_OTHER): Payer: Self-pay | Admitting: Vascular Surgery

## 2017-03-28 NOTE — Telephone Encounter (Signed)
Called the patient back to inform him that Dr. Wyn Quaker has ordered ABI's so that he can take a more in depth look at his leg on his next visit and that Dr. Wyn Quaker stated for him to see his primary care doctor if the pain continues and for him to continue to wear his compression stockings, the voicemail has not yet been setup so I could not leave a message.

## 2017-03-28 NOTE — Telephone Encounter (Signed)
PATIENT CALLED BECAUSE HE UNDERSTOOD FROM HIS DISCHARGE INSTRUCTIONS THAT HE SHOULD BE SEEING JD AS SOON AS POSSIBLE. I ADVISED HIM OF HIS UPCOMING APPT ON 9/12 AND HE FEELS LIKE  IT WAS ON THE 27TH. I ADVISED HIM THAT JD WAS NOT IN THE OFFICE ON MONDAYS AND THAT HE HAS AN APPT SOMEWHERE ELSE. HE STATES THAT HE IS STILL IN A LOT OF PAIN IN HIS LEFT LEG AND WANTS TO KNOW WHAT HE SHOULD DO. I ADVISED HIM I WILL FWD A NOTE TO THE NURSE TO SPEAK WITH JD TO FIND OUT.

## 2017-03-30 ENCOUNTER — Emergency Department: Payer: Medicare Other

## 2017-03-30 ENCOUNTER — Other Ambulatory Visit: Payer: Self-pay

## 2017-03-30 ENCOUNTER — Emergency Department
Admission: EM | Admit: 2017-03-30 | Discharge: 2017-03-30 | Disposition: A | Discharge status: Home / Self Care | Payer: Medicare Other | Attending: Emergency Medicine | Admitting: Emergency Medicine

## 2017-03-30 DIAGNOSIS — M79606 Pain in leg, unspecified: Secondary | ICD-10-CM | POA: Diagnosis not present

## 2017-03-30 DIAGNOSIS — J439 Emphysema, unspecified: Secondary | ICD-10-CM | POA: Diagnosis not present

## 2017-03-30 DIAGNOSIS — B20 Human immunodeficiency virus [HIV] disease: Secondary | ICD-10-CM | POA: Diagnosis not present

## 2017-03-30 DIAGNOSIS — I10 Essential (primary) hypertension: Secondary | ICD-10-CM | POA: Insufficient documentation

## 2017-03-30 DIAGNOSIS — M79605 Pain in left leg: Secondary | ICD-10-CM | POA: Diagnosis not present

## 2017-03-30 DIAGNOSIS — I252 Old myocardial infarction: Secondary | ICD-10-CM | POA: Insufficient documentation

## 2017-03-30 DIAGNOSIS — J441 Chronic obstructive pulmonary disease with (acute) exacerbation: Secondary | ICD-10-CM | POA: Insufficient documentation

## 2017-03-30 DIAGNOSIS — E119 Type 2 diabetes mellitus without complications: Secondary | ICD-10-CM | POA: Insufficient documentation

## 2017-03-30 DIAGNOSIS — J449 Chronic obstructive pulmonary disease, unspecified: Secondary | ICD-10-CM | POA: Insufficient documentation

## 2017-03-30 DIAGNOSIS — I82401 Acute embolism and thrombosis of unspecified deep veins of right lower extremity: Secondary | ICD-10-CM | POA: Diagnosis not present

## 2017-03-30 DIAGNOSIS — N189 Chronic kidney disease, unspecified: Secondary | ICD-10-CM | POA: Diagnosis not present

## 2017-03-30 DIAGNOSIS — J45909 Unspecified asthma, uncomplicated: Secondary | ICD-10-CM | POA: Insufficient documentation

## 2017-03-30 DIAGNOSIS — R0602 Shortness of breath: Secondary | ICD-10-CM | POA: Insufficient documentation

## 2017-03-30 DIAGNOSIS — R079 Chest pain, unspecified: Secondary | ICD-10-CM | POA: Diagnosis not present

## 2017-03-30 DIAGNOSIS — M7989 Other specified soft tissue disorders: Secondary | ICD-10-CM | POA: Diagnosis not present

## 2017-03-30 DIAGNOSIS — I129 Hypertensive chronic kidney disease with stage 1 through stage 4 chronic kidney disease, or unspecified chronic kidney disease: Secondary | ICD-10-CM | POA: Diagnosis not present

## 2017-03-30 DIAGNOSIS — I2510 Atherosclerotic heart disease of native coronary artery without angina pectoris: Secondary | ICD-10-CM | POA: Diagnosis not present

## 2017-03-30 DIAGNOSIS — Z87891 Personal history of nicotine dependence: Secondary | ICD-10-CM | POA: Insufficient documentation

## 2017-03-30 DIAGNOSIS — E1122 Type 2 diabetes mellitus with diabetic chronic kidney disease: Secondary | ICD-10-CM | POA: Diagnosis not present

## 2017-03-30 LAB — CBC
HEMATOCRIT: 37.1 % — AB (ref 40.0–52.0)
HEMOGLOBIN: 12.6 g/dL — AB (ref 13.0–18.0)
MCH: 33.6 pg (ref 26.0–34.0)
MCHC: 33.9 g/dL (ref 32.0–36.0)
MCV: 99 fL (ref 80.0–100.0)
Platelets: 267 10*3/uL (ref 150–440)
RBC: 3.75 MIL/uL — ABNORMAL LOW (ref 4.40–5.90)
RDW: 15 % — ABNORMAL HIGH (ref 11.5–14.5)
WBC: 8.4 10*3/uL (ref 3.8–10.6)

## 2017-03-30 LAB — APTT: aPTT: 25 seconds (ref 24–36)

## 2017-03-30 LAB — BASIC METABOLIC PANEL
Anion gap: 7 (ref 5–15)
BUN: 18 mg/dL (ref 6–20)
CHLORIDE: 108 mmol/L (ref 101–111)
CO2: 27 mmol/L (ref 22–32)
CREATININE: 1.12 mg/dL (ref 0.61–1.24)
Calcium: 9 mg/dL (ref 8.9–10.3)
GFR calc non Af Amer: >60 mL/min (ref >60)
Glucose, Bld: 119 mg/dL — ABNORMAL HIGH (ref 65–99)
POTASSIUM: 4.9 mmol/L (ref 3.5–5.1)
Sodium: 142 mmol/L (ref 135–145)

## 2017-03-30 LAB — BRAIN NATRIURETIC PEPTIDE: B NATRIURETIC PEPTIDE 5: 51 pg/mL (ref 0.0–100.0)

## 2017-03-30 LAB — TROPONIN I

## 2017-03-30 LAB — PROTIME-INR
INR: 1
Prothrombin Time: 13.2 seconds (ref 11.4–15.2)

## 2017-03-30 MED ORDER — HYDROCODONE-ACETAMINOPHEN 5-325 MG PO TABS
1.0000 | ORAL_TABLET | Freq: Once | ORAL | Status: AC
  Administered 2017-03-30: 1 via ORAL
  Filled 2017-03-30: qty 1

## 2017-03-30 MED ORDER — FENTANYL CITRATE (PF) 100 MCG/2ML IJ SOLN
50.0000 ug | Freq: Once | INTRAMUSCULAR | Status: AC
  Administered 2017-03-30: 50 ug via INTRAVENOUS
  Filled 2017-03-30: qty 2

## 2017-03-30 MED ORDER — IOPAMIDOL (ISOVUE-370) INJECTION 76%
75.0000 mL | Freq: Once | INTRAVENOUS | Status: AC | PRN
  Administered 2017-03-30: 75 mL via INTRAVENOUS

## 2017-03-30 NOTE — ED Notes (Signed)
Pt to US.

## 2017-03-30 NOTE — ED Triage Notes (Signed)
Per EMS pt comes from home with pain in the left leg.  Pt c/o of 10/10 pain.  Pt has a history of blood clots in both legs.  Pt takes Lovenox shots.  Pt is A&Ox4.

## 2017-03-30 NOTE — ED Provider Notes (Addendum)
[facility] Emergency Department Provider Note  ____________________________________________  Time seen: Approximately 9:11 AM  I have reviewed the triage vital signs and the nursing notes.   HISTORY  Chief Complaint Leg Pain    HPI Brian Hampton is a 61 y.o. male with a history of HIV, CAD, COPD, DVT and PE hx s/p IVC filter on Lovenox, recent hospitalization for left leg pain, presenting with left leg pain. The patient reports that for the past 2 months, he has had a sharp pain that is intermittent that goes from the left buttock to the posterior thigh. He cannot say whether anything makes it better or worse. He was discharged 8/15after evaluation for this pain. He underwent ultrasound which did not show DVT he had CT examination which did not show any abnormalities. 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 He was evaluated by vascular surgery who did not feel the patient's pain was most consistent with a vascular source; recommendation was compression hose. He has not had any new trauma. The pain is similar in character but he says it is more severe which is why he called the paramedics this morning. He has also noted some left greater than right lower extremity swelling.  In addition, the patient reports that for the past 3 days he has had an intermittent central chest pain which radiates to the left side. It improves with nitroglycerin. It is not associated with shortness of breath, nausea or vomiting, diaphoresis or radiation into the arm, jaw or neck. The pain is nonpleuritic and not related to exertion or food.   Past Medical History:  Diagnosis Date  . Anemia   . Asthma   . Cataracts, bilateral    worse in Rt eye  . Collagen vascular disease (HCC)   . COPD (chronic obstructive pulmonary disease) (HCC)   . Coronary artery disease   . Depression   . DVT (deep venous thrombosis) (HCC)   . Dysrhythmia   . Emphysema/COPD (HCC)   .  Environmental and seasonal allergies   . H/O blood clots   . Heart murmur   . HIV (human immunodeficiency virus infection) (HCC)   . Hypertension   . Leaky heart valve    x 3  . Lung mass   . Myocardial infarction (HCC)    in 2000  . Pneumonia    year ago  . Pulmonary emboli (HCC)   . Type 2 diabetes mellitus [facility])     Patient Active Problem List   Diagnosis Date Noted  . Left leg pain 03/22/2017  . DVT (deep venous thrombosis) (HCC) 03/12/2017  . Acute deep vein thrombosis (DVT) of right lower extremity (HCC) 02/25/2017  . Postoperative pain   . COPD exacerbation (HCC) 12/23/2016  . Abdominal pain   . Compression fracture of body of thoracic vertebra (HCC) 12/21/2016  . Hypomagnesemia 05/05/2016  . Rhabdomyolysis 05/05/2016  . Acute pulmonary embolism (HCC) 03/17/2016  . Right leg DVT (HCC) 03/17/2016  . Hypokalemia 03/17/2016  . S/P IVC filter 03/17/2016  . SOB (shortness of breath) 03/15/2016  . Dysphagia 02/02/2016  . GERD (gastroesophageal reflux disease) 02/02/2016  . Hyperthyroidism 01/31/2016  . 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 Elevated transaminase level 01/31/2016  . Anemia 01/31/2016  . Thrombocytopenia (HCC) 01/31/2016  . Pyuria 01/31/2016  . Weakness 01/29/2016  . HIV (human immunodeficiency virus infection) (HCC) 01/29/2016  . CAD (coronary artery disease) 01/29/2016  . COPD (chronic obstructive pulmonary disease) (HCC) 01/29/2016  .  Diabetes mellitus (HCC) 01/29/2016  . Leg weakness, bilateral 01/13/2016  . Chest pain 06/24/2015    Past Surgical History:  Procedure Laterality Date  . ANKLE ARTHROSCOPY    . IVC FILTER INSERTION    . KYPHOPLASTY N/A 12/21/2016   Procedure: KYPHOPLASTY;  Surgeon: Kennedy Bucker, MD;  Location: [facility] ORS;  Service: Orthopedics;  Laterality: N/A;  . PERIPHERAL VASCULAR CATHETERIZATION N/A 03/16/2016   Procedure: IVC Filter Insertion;  Surgeon: Renford Dills, MD;  Location: [facility] INVASIVE CV LAB;  Service:  Cardiovascular;  Laterality: N/A;  . SINUS EXPLORATION    . WRIST ARTHROSCOPY Right     Current Outpatient Rx  . Order #: [id number] Class: Historical Med  . Order #: [id number] Class: No Print  . Order #: [id number] Class: Historical Med  . Order #: [id number] Class: Historical Med  . Order #: [id number] Class: Historical Med  . Order #: [id number] Class: Historical Med  . Order #: [id number] Class: Historical Med  . Order #: [id number] Class: Historical Med  . Order #: [id number] Class: Historical Med  . Order #: [id number] Class: Historical Med  . Order #: [id number] Class: Print  . Order #: [id number] Class: Historical Med  . Order #: [id number] Class: No Print  . Order #: [id number] Class: Print  . Order #: [id number] Class: Historical Med  . Order #: [id number] Class: Normal  . Order #: [id number] Class: Historical Med  . Order #: [id number] Class: Historical Med  . Order #: [id number] Class: Historical Med  . Order #: [id number] Class: Historical Med  . Order #: [id number] Class: Historical Med  . Order #: [id number] Class: Historical Med  . Order #: [id number] Class: Historical Med  . Order #: [id number] Class: Historical Med  . Order #: [id number] Class: Historical Med  . Order #: [id number] Class: Historical Med  . Order #: [id number] Class: Historical Med  . Order #: [id number] Class: Historical Med    Allergies Aspirin; Bee venom; Penicillins; Prednisone; Sulfa antibiotics; Sulfasalazine; Theophyllines; and Theophylline  Family History  Problem Relation Age of Onset  . CAD Unknown   . Asthma Mother   . Cirrhosis Father   . Deep vein thrombosis Neg Hx   . Diabetes Neg Hx     Social History Social History  Substance Use Topics  . Smoking status: Former Smoker    Quit date: 12/16/2001  . Smokeless tobacco: Never Used  . Alcohol use No    Review of Systems Constitutional: No fever/chills.No lightheadedness or syncope. Eyes: No visual changes. No eye discharge. ENT: No sore throat. No congestion or  rhinorrhea. Cardiovascular: Positive chest pain. Denies palpitations. Respiratory: Denies shortness of breath.  No cough. Gastrointestinal: No abdominal pain.  No nausea, no vomiting.  No diarrhea.  No constipation. Genitourinary: Negative for dysuria. Musculoskeletal: Negative for new back pain. Positive for sharp pains from the left buttock to the posterior thigh. Positive left lower extremity swelling. Skin: Negative for rash. Neurological: Negative for headaches. No focal numbness, tingling or weakness.     ____________________________________________   PHYSICAL EXAM:  VITAL SIGNS: ED Triage Vitals [03/30/17 0902]  Enc Vitals Group     BP      Pulse      Resp      Temp      Temp src      SpO2      Weight      Height      Head Circumference      Peak Flow      Pain Score 10     Pain Loc      Pain Edu?      Excl.  in GC?     Constitutional: Alert and oriented. Chronically ill appearing and in no acute distress. Answers questions appropriately. Eyes: Conjunctivae are normal.  EOMI. No scleral icterus. Head: Atraumatic. Nose: No congestion/rhinnorhea. Mouth/Throat: Mucous membranes are moist.  Neck: No stridor.  Supple.  No meningismus. No midline C-spine tenderness to palpation, step-offs or deformities. No JVD. Cardiovascular: Normal rate, regular rhythm. No murmurs, rubs or gallops.  Respiratory: Normal respiratory effort.  No accessory muscle use or retractions. Lungs CTAB.  No wheezes, rales or ronchi. Gastrointestinal: Obese. Soft, nontender and nondistended.  No guarding or rebound.  No peritoneal signs. Musculoskeletal: The patient has diffuse tenderness to palpation in the mid and lower L-spine in the midline without step-offs or deformities nor any evidence of bruising or abnormalities on the overlying skin. No lower extremity edema. No ttp in the calves or palpable cords.  Negative Homan's sign. On the left leg, the patient has a normal posterior tibialis pulse  and Neurologic:  A&Ox3.  Speech is clear.  Face and smile are symmetric.  EOMI.  Moves all extremities well. Skin:  Skin is warm, dry and intact. No rash noted. Psychiatric: Mood and affect are normal. Speech and behavior are normal.  Normal judgement.  ____________________________________________   LABS (all labs ordered are listed, but only abnormal results are displayed)  Labs Reviewed  CBC - Abnormal; Notable for the following:       Result Value   RBC 3.75 (*)    Hemoglobin 12.6 (*)    HCT 37.1 (*)    RDW 15.0 (*)    All other components within normal limits  BASIC METABOLIC PANEL - Abnormal; Notable for the following:    Glucose, Bld 119 (*)    All other components within normal limits  PROTIME-INR  APTT  TROPONIN I  BRAIN NATRIURETIC PEPTIDE  URINALYSIS, COMPLETE (UACMP) WITH MICROSCOPIC  TROPONIN I   ____________________________________________  EKG  ED ECG REPORT I, Rockne Menghini, the attending physician, personally viewed and interpreted this ECG.   Date: 03/30/2017  EKG Time: 1110  Rate: 84  Rhythm: normal sinus rhythm  Axis: normal  Intervals:none  ST&T Change: No STEMI  Repeat EKG ED ECG REPORT I, Rockne Menghini, the attending physician, personally viewed and interpreted this ECG.   Date: 03/30/2017  EKG Time: 1352  Rate: 83  Rhythm: normal sinus rhythm  Axis: normal  Intervals:none  ST&T Change: No STEMI   ____________________________________________  RADIOLOGY  Ct Angio Chest Pe W And/or Wo Contrast  Result Date: 03/30/2017 CLINICAL DATA:  Chest pain and shortness of breath.  Left leg pain. EXAM: CT ANGIOGRAPHY CHEST WITH CONTRAST TECHNIQUE: Multidetector CT imaging of the chest was performed using the standard protocol during bolus administration of intravenous contrast. Multiplanar CT image reconstructions and MIPs were obtained to evaluate the vascular anatomy. CONTRAST:  75 cc Isovue 370 COMPARISON:  03/22/2017 FINDINGS:  Cardiovascular: Mediastinum/Nodes: No enlarged mediastinal, hilar, or axillary lymph nodes. Thyroid gland, trachea, and esophagus demonstrate no significant findings. Lungs/Pleura: Lungs are clear. No pleural effusion or pneumothorax. Upper Abdomen: No acute abnormality. Musculoskeletal: No chest wall abnormality. No acute abnormalities. Old compression fractures in the midthoracic spine with previous vertebroplasty at T6. Review of the MIP images confirms the above findings. IMPRESSION: No acute abnormalities.  No evidence of acute pulmonary embolism. Electronically Signed   By: Francene Boyers M.D.   On: 03/30/2017 12:31   US Venous Img Lower Unilateral Left  Result Date: 03/30/2017 CLINICAL DATA:  Left leg  pain and swelling. EXAM: LEFT LOWER EXTREMITY VENOUS DOPPLER ULTRASOUND TECHNIQUE: Gray-scale sonography with graded compression, as well as color Doppler and duplex ultrasound were performed to evaluate the lower extremity deep venous systems from the level of the common femoral vein and including the common femoral, femoral, profunda femoral, popliteal and calf veins including the posterior tibial, peroneal and gastrocnemius veins when visible. The superficial great saphenous vein was also interrogated. Spectral Doppler was utilized to evaluate flow at rest and with distal augmentation maneuvers in the common femoral, femoral and popliteal veins. COMPARISON:  None. FINDINGS: Contralateral Common Femoral Vein: Respiratory phasicity is normal and symmetric with the symptomatic side. No evidence of thrombus. Normal compressibility. Common Femoral Vein: No evidence of thrombus. Normal compressibility, respiratory phasicity and response to augmentation. Saphenofemoral Junction: No evidence of thrombus. Normal compressibility and flow on color Doppler imaging. Profunda Femoral Vein: No evidence of thrombus. Normal compressibility and flow on color Doppler imaging. Femoral Vein: No evidence of thrombus. Normal  compressibility, respiratory phasicity and response to augmentation. Popliteal Vein: No evidence of thrombus. Normal compressibility, respiratory phasicity and response to augmentation. Calf Veins: No evidence of thrombus. Normal compressibility and flow on color Doppler imaging. Superficial Great Saphenous Vein: No evidence of thrombus. Normal compressibility and flow on color Doppler imaging. Venous Reflux:  None. Other Findings:  None. IMPRESSION: No evidence of DVT within the left lower extremity. Electronically Signed   By: Elige Ko   On: 03/30/2017 10:25    ____________________________________________   PROCEDURES  Procedure(s) performed: None  Procedures  Critical Care performed: No ____________________________________________   INITIAL IMPRESSION / ASSESSMENT AND PLAN / ED COURSE  Pertinent labs & imaging results that were available during my care of the patient were reviewed by me and considered in my medical decision making (see chart for details).  61 y.o. male with multiple chronic comorbidities and significant cardiac and atherosclerotic risk factors presenting with left leg pain and chest pain. Overall, the patient is mildly hypertensive but otherwise hemodynamically stable. He is a difficult historian and is difficult to ascertain how many of his symptoms are new versus chronic. However, given his significant risk factors, we'll evaluate for left lower extremity DVT, PE, ACS or MI. Plan reevaluation for final disposition.  ----------------------------------------- 1:27 PM on 03/30/2017 -----------------------------------------  Throughout the entirety of his stay, Brian Hampton has remained hemodynamically stable in the emergency department. His workup is reassuring with a normal white blood cell count, normal electrolytes, and a negative troponin. In addition, his ultrasound does not show DVT in the left lower extremity, he has no evidence of PE on his CT scan. At this time,  I have no findings of an acute emergency or illness that requires admission or further intervention. We'll get a second troponin, and continue to treat the patient's pain with hydrocodone, which is what he takes at home and does usually help. If his second troponin is negative, I'll plan discharge home. He already has an appointment with his cardiologist on Friday.  ----------------------------------------- 2:33 PM on 03/30/2017 -----------------------------------------  The patient's repeat troponin and EKG are reassuring. The patient is stable for discharge at this time. Return precautions as well as follow-up instructions were discussed.  ____________________________________________  FINAL CLINICAL IMPRESSION(S) / ED DIAGNOSES  Final diagnoses:  Left leg pain  Chest pain, unspecified type         NEW MEDICATIONS STARTED DURING THIS VISIT:  New Prescriptions   No medications on file      Rockne Menghini,  MD 03/30/17 1329    Rockne Menghini, MD 03/30/17 1433    Rockne Menghini, MD 03/30/17 [phone]

## 2017-03-30 NOTE — Discharge Instructions (Signed)
You may continue to take hydrocodone, as prescribed by your doctors, for your leg pain.  Return to the emergency department if you develop severe pain, numbness tingling or weakness, fever, shortness of breath, or any other symptoms concerning to you.

## 2017-03-31 ENCOUNTER — Ambulatory Visit: Payer: Medicare Other | Admitting: *Deleted

## 2017-03-31 ENCOUNTER — Other Ambulatory Visit: Payer: Self-pay | Admitting: *Deleted

## 2017-03-31 NOTE — Patient Outreach (Signed)
Successful telephone encounter to Brian Hampton, 61 year old male for transition of care, ongoing follow up on recent hospitalization August 4-,2018 for DVT right lower extremity, weakness.   Also to follow up on ED visit yesterday 03/31/17 for leg and chest pain (view in Epic DVT ruled out for leg.    Spoke with pt, HIPAA identifiers verified, pt reports for 2 days right hand  was swollen, yesterday black looking/white drainage- they looked  at it  ED visit- was told has a small blood clot/wrapped it/told to follow up with PCP.  Pt reports to see PCP today, Heart MD tomorrow, Specialist at [facility] 8/27 and Dr. Wyn Quaker 04/20/17.  Pt reports currently has pain in right hand, took some Tylenol/helped a little. Pt reports trying to get Oxycodone, to ask PCP today.  Pt reports BP was elevated at ED- 193/100 - coming from pain, trying to get a BP cuff/wonder if Medicare/Medicaid will pay for it, to ask PCP about that.    Pt reports home health came yesterday, was told to have RN and PT services.   Pt reports taking all of his medications as ordered.     Plan:  As discussed with pt, plan to follow up again next week telephonically (part of ongoing transition of care).  Shayne Alken.   Pierzchala RN CCM [facility]  [phone]

## 2017-04-01 ENCOUNTER — Emergency Department
Admission: EM | Admit: 2017-04-01 | Discharge: 2017-04-01 | Disposition: A | Discharge status: Home / Self Care | Payer: Medicare Other | Attending: Emergency Medicine | Admitting: Emergency Medicine

## 2017-04-01 ENCOUNTER — Encounter: Payer: Self-pay | Admitting: Emergency Medicine

## 2017-04-01 DIAGNOSIS — J449 Chronic obstructive pulmonary disease, unspecified: Secondary | ICD-10-CM | POA: Insufficient documentation

## 2017-04-01 DIAGNOSIS — Z95828 Presence of other vascular implants and grafts: Secondary | ICD-10-CM | POA: Insufficient documentation

## 2017-04-01 DIAGNOSIS — Z79899 Other long term (current) drug therapy: Secondary | ICD-10-CM | POA: Insufficient documentation

## 2017-04-01 DIAGNOSIS — B20 Human immunodeficiency virus [HIV] disease: Secondary | ICD-10-CM | POA: Diagnosis not present

## 2017-04-01 DIAGNOSIS — R233 Spontaneous ecchymoses: Secondary | ICD-10-CM | POA: Diagnosis not present

## 2017-04-01 DIAGNOSIS — I2510 Atherosclerotic heart disease of native coronary artery without angina pectoris: Secondary | ICD-10-CM | POA: Insufficient documentation

## 2017-04-01 DIAGNOSIS — Z87891 Personal history of nicotine dependence: Secondary | ICD-10-CM | POA: Diagnosis not present

## 2017-04-01 DIAGNOSIS — Z7901 Long term (current) use of anticoagulants: Secondary | ICD-10-CM | POA: Diagnosis not present

## 2017-04-01 DIAGNOSIS — M79641 Pain in right hand: Secondary | ICD-10-CM | POA: Diagnosis present

## 2017-04-01 DIAGNOSIS — E119 Type 2 diabetes mellitus without complications: Secondary | ICD-10-CM | POA: Insufficient documentation

## 2017-04-01 DIAGNOSIS — M7981 Nontraumatic hematoma of soft tissue: Secondary | ICD-10-CM | POA: Diagnosis not present

## 2017-04-01 DIAGNOSIS — Z86718 Personal history of other venous thrombosis and embolism: Secondary | ICD-10-CM | POA: Diagnosis not present

## 2017-04-01 DIAGNOSIS — I82402 Acute embolism and thrombosis of unspecified deep veins of left lower extremity: Secondary | ICD-10-CM | POA: Diagnosis not present

## 2017-04-01 DIAGNOSIS — J45909 Unspecified asthma, uncomplicated: Secondary | ICD-10-CM | POA: Insufficient documentation

## 2017-04-01 DIAGNOSIS — S60221A Contusion of right hand, initial encounter: Secondary | ICD-10-CM | POA: Diagnosis not present

## 2017-04-01 NOTE — ED Provider Notes (Signed)
[facility] Emergency Department Provider Note ____________________________________________  Time seen: 11:31 AM  I have reviewed the triage vital signs and the nursing notes.  HISTORY  Chief Complaint  Hand Problem   HPI Brian Hampton is a 61 y.o. male is here with complaint of a area to his right hand. Patient states he noticed it 3 days ago and there is been some slight draining to it. He is unaware of any injury. Patient is on blood thinners. Family member states that he was told to come to the emergency room.   Past Medical History:  Diagnosis Date  . Anemia   . Asthma   . Cataracts, bilateral    worse in Rt eye  . Collagen vascular disease (HCC)   . COPD (chronic obstructive pulmonary disease) (HCC)   . Coronary artery disease   . Depression   . DVT (deep venous thrombosis) (HCC)   . Dysrhythmia   . Emphysema/COPD (HCC)   . Environmental and seasonal allergies   . H/O blood clots   . Heart murmur   . HIV (human immunodeficiency virus infection) (HCC)   . Hypertension   . Leaky heart valve    x 3  . Lung mass   . Myocardial infarction (HCC)    in 2000  . Pneumonia    year ago  . Pulmonary emboli (HCC)   . Type 2 diabetes mellitus [facility])     Patient Active Problem List   Diagnosis Date Noted  . Left leg pain 03/22/2017  . DVT (deep venous thrombosis) (HCC) 03/12/2017  . Acute deep vein thrombosis (DVT) of right lower extremity (HCC) 02/25/2017  . Postoperative pain   . COPD exacerbation (HCC) 12/23/2016  . Abdominal pain   . Compression fracture of body of thoracic vertebra (HCC) 12/21/2016  . Hypomagnesemia 05/05/2016  . Rhabdomyolysis 05/05/2016  . Acute pulmonary embolism (HCC) 03/17/2016  . Right leg DVT (HCC) 03/17/2016  . Hypokalemia 03/17/2016  . S/P IVC filter 03/17/2016  . SOB (shortness of breath) 03/15/2016  . Dysphagia 02/02/2016  . GERD (gastroesophageal reflux disease) 02/02/2016  . Hyperthyroidism 01/31/2016  .  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 Elevated transaminase level 01/31/2016  . Anemia 01/31/2016  . Thrombocytopenia (HCC) 01/31/2016  . Pyuria 01/31/2016  . Weakness 01/29/2016  . HIV (human immunodeficiency virus infection) (HCC) 01/29/2016  . CAD (coronary artery disease) 01/29/2016  . COPD (chronic obstructive pulmonary disease) (HCC) 01/29/2016  . Diabetes mellitus (HCC) 01/29/2016  . Leg weakness, bilateral 01/13/2016  . Chest pain 06/24/2015    Past Surgical History:  Procedure Laterality Date  . ANKLE ARTHROSCOPY    . IVC FILTER INSERTION    . KYPHOPLASTY N/A 12/21/2016   Procedure: KYPHOPLASTY;  Surgeon: Kennedy Bucker, MD;  Location: [facility] ORS;  Service: Orthopedics;  Laterality: N/A;  . PERIPHERAL VASCULAR CATHETERIZATION N/A 03/16/2016   Procedure: IVC Filter Insertion;  Surgeon: Renford Dills, MD;  Location: [facility] INVASIVE CV LAB;  Service: Cardiovascular;  Laterality: N/A;  . SINUS EXPLORATION    . WRIST ARTHROSCOPY Right     Prior to Admission medications   Medication Sig Start Date End Date Taking? Authorizing Provider  abacavir-dolutegravir-lamiVUDine (TRIUMEQ) 600-50-300 MG tablet Take 1 tablet by mouth daily after breakfast. Reported on 01/30/2016 01/27/16   [provider]  acetaminophen (TYLENOL) 325 MG tablet Take 1 tablet (325 mg total) by mouth every 6 (six) hours as needed for mild pain (or Fever >/= 101). 03/15/17   Ramonita Lab, MD  albuterol (PROVENTIL HFA;VENTOLIN HFA) 108 (90 BASE) MCG/ACT inhaler Inhale 2 puffs into the lungs every 6 (six) hours as needed for wheezing or shortness of breath.    [provider]  albuterol (PROVENTIL) (2.5 MG/3ML) 0.083% nebulizer solution Take 2.5 mg by nebulization 3 (three) times daily.     [provider]  atorvastatin (LIPITOR) 10 MG tablet Take 10 mg by mouth daily after breakfast. 11/16/16   [provider]  Calcium Carbonate-Vitamin D (CALCIUM 600+D) 600-400 MG-UNIT tablet Take 1  tablet by mouth daily after breakfast.     [provider]  carvedilol (COREG) 12.5 MG tablet Take 12.5 mg by mouth 2 (two) times daily with a meal.    [provider]  cetirizine (ZYRTEC) 10 MG tablet Take 10 mg by mouth daily as needed for allergies.     [provider]  EPINEPHrine (EPIPEN 2-PAK) 0.3 mg/0.3 mL IJ SOAJ injection Inject 0.3 mg into the muscle once as needed (for severe allergic reaction).    [provider]  escitalopram (LEXAPRO) 20 MG tablet Take 20 mg by mouth daily after breakfast.     [provider]  fondaparinux (ARIXTRA) 7.5 MG/0.6ML SOLN injection Inject 0.6 mLs (7.5 mg total) into the skin daily. 03/15/17   Gouru, Deanna Artis, MD  furosemide (LASIX) 20 MG tablet Take 20 mg by mouth daily.  10/22/16   [provider]  guaiFENesin (MUCINEX) 600 MG 12 hr tablet Take 1 tablet (600 mg total) by mouth 2 (two) times daily. Patient not taking: Reported on 03/24/2017 03/15/17   Ramonita Lab, MD  HYDROcodone-acetaminophen (NORCO) 5-325 MG tablet Take 1 tablet by mouth every 4 (four) hours as needed for moderate pain. 03/15/17   Ramonita Lab, MD  levothyroxine (SYNTHROID, LEVOTHROID) 50 MCG tablet Take 1 tablet (50 mcg total) by mouth daily before breakfast. 03/05/17 04/04/17  Auburn Bilberry, MD  LINZESS 72 MCG capsule Take 72 mcg by mouth daily after breakfast. 11/10/16   [provider]  losartan (COZAAR) 25 MG tablet Take 25 mg by mouth every morning. 03/03/17   [provider]  magnesium oxide (MAG-OX) 400 MG tablet Take 400 mg by mouth daily after breakfast.    [provider]  meloxicam (MOBIC) 7.5 MG tablet Take 7.5 mg by mouth daily as needed for pain.    [provider]  mirtazapine (REMERON) 15 MG tablet Take 15 mg by mouth daily.     [provider]  mometasone-formoterol (DULERA) 200-5 MCG/ACT AERO Inhale 2 puffs into the lungs 3 (three) times daily.     [provider]  Multiple  Vitamin (MULTIVITAMIN WITH MINERALS) TABS tablet Take 1 tablet by mouth daily. ONE-A-DAY MULTIVITAMIN 50+    [provider]  nitroGLYCERIN (NITROSTAT) 0.4 MG SL tablet Place 0.4 mg under the tongue every 5 (five) minutes as needed for chest pain.    [provider]  omeprazole (PRILOSEC) 40 MG capsule Take 40 mg by mouth daily after breakfast. 11/16/16   [provider]  Potassium Chloride CR (MICRO-K) 8 MEQ CPCR capsule CR Take 1 capsule by mouth daily after breakfast. 11/15/16   [provider]  sennosides-docusate sodium (SENOKOT-S) 8.6-50 MG tablet Take 1 tablet by mouth at bedtime as needed for constipation.    [provider]  tiotropium (SPIRIVA) 18 MCG inhalation capsule Place 18 mcg into inhaler and inhale daily.    [provider]  traMADol (ULTRAM) 50 MG tablet Take by mouth every 6 (six) hours  as needed. Pt taking twice a day    [provider]  zolpidem (AMBIEN) 5 MG tablet Take 5 mg by mouth at bedtime. 10/15/16   [provider]    Allergies Aspirin; Bee venom; Penicillins; Prednisone; Sulfa antibiotics; Sulfasalazine; Theophyllines; and Theophylline  Family History  Problem Relation Age of Onset  . CAD Unknown   . Asthma Mother   . Cirrhosis Father   . Deep vein thrombosis Neg Hx   . Diabetes Neg Hx     Social History Social History  Substance Use Topics  . Smoking status: Former Smoker    Quit date: 12/16/2001  . Smokeless tobacco: Never Used  . Alcohol use No    Review of Systems  Constitutional: Negative for fever. Cardiovascular: Negative for chest pain. Respiratory: Negative for shortness of breath. Musculoskeletal: Negative for back pain. Skin: Positive for ecchymosis. Neurological: Negative for headaches, focal weakness or numbness. ____________________________________________  PHYSICAL EXAM:  VITAL SIGNS: ED Triage Vitals [04/01/17 1047]  Enc Vitals Group     BP      Pulse       Resp      Temp      Temp src      SpO2      Weight 187 lb (84.8 kg)     Height 5\' 5"  (1.651 m)     Head Circumference      Peak Flow      Pain Score 8     Pain Loc      Pain Edu?      Excl. in GC?     Constitutional: Alert and oriented. Well appearing and in no distress. Head: Normocephalic and atraumatic. Eyes: Conjunctivae are normal.  Neck: No stridor Cardiovascular: Normal rate, regular rhythm. Normal distal pulses. Respiratory: Normal respiratory effort. No wheezes/rales/rhonchi. Musculoskeletal: Nontender with normal range of motion in all extremities.  Neurologic:  Normal gait without ataxia. Normal speech and language. No gross focal neurologic deficits are appreciated. Skin:  Skin is warm, dry and intact. Dorsum of the right hand there is a hematoma present without active drainage. Psychiatric: Mood and affect are normal. Patient exhibits appropriate insight and judgment.  INITIAL IMPRESSION / ASSESSMENT AND PLAN / ED COURSE  Patient was placed in a absorbent dressing. He has some ecchymotic areas on his hand where he is bruised easily. Patient is follow-up with his PCP if any continued problems. He is reassured that this is from his blood thinners and is not the type of blood clot that would cause him any problems with breathing like a DVT.    ____________________________________________  FINAL CLINICAL IMPRESSION(S) / ED DIAGNOSES  Final diagnoses:  Spontaneous hematoma of hand  Current use of long term anticoagulation     Tommi Rumps, PA-C 04/01/17 1442    Jeanmarie Plant, MD 04/01/17 [phone]

## 2017-04-01 NOTE — ED Notes (Signed)
See triage note  States he developed a dark raised area to back of right hand 2-3 days ago  States having increased pain and area is draining slightly   Denies any trauma

## 2017-04-01 NOTE — ED Triage Notes (Signed)
Patient presents to the ED with a dark raised area to his right hand.  Patient reports he noticed area 3 days ago.  Patient reports area is draining slightly and is painful and tender.  Patient denies fever or other signs of systemic infection.  No obvious distress at this time.

## 2017-04-01 NOTE — Discharge Instructions (Signed)
Follow-up with your primary care doctor if any continued concerns. This area will resolve on its on. It may open and this skin may peel back.

## 2017-04-04 ENCOUNTER — Encounter: Payer: Self-pay | Admitting: Internal Medicine

## 2017-04-04 ENCOUNTER — Inpatient Hospital Stay: Payer: Medicare Other | Attending: Internal Medicine | Admitting: Internal Medicine

## 2017-04-04 DIAGNOSIS — Z7901 Long term (current) use of anticoagulants: Secondary | ICD-10-CM | POA: Diagnosis not present

## 2017-04-04 DIAGNOSIS — Z86711 Personal history of pulmonary embolism: Secondary | ICD-10-CM | POA: Diagnosis not present

## 2017-04-04 DIAGNOSIS — Z8701 Personal history of pneumonia (recurrent): Secondary | ICD-10-CM | POA: Diagnosis not present

## 2017-04-04 DIAGNOSIS — D6851 Activated protein C resistance: Secondary | ICD-10-CM | POA: Insufficient documentation

## 2017-04-04 DIAGNOSIS — Z86718 Personal history of other venous thrombosis and embolism: Secondary | ICD-10-CM | POA: Insufficient documentation

## 2017-04-04 DIAGNOSIS — I82449 Acute embolism and thrombosis of unspecified tibial vein: Secondary | ICD-10-CM | POA: Insufficient documentation

## 2017-04-04 DIAGNOSIS — I6523 Occlusion and stenosis of bilateral carotid arteries: Secondary | ICD-10-CM | POA: Insufficient documentation

## 2017-04-04 DIAGNOSIS — I82411 Acute embolism and thrombosis of right femoral vein: Secondary | ICD-10-CM | POA: Diagnosis not present

## 2017-04-04 DIAGNOSIS — B20 Human immunodeficiency virus [HIV] disease: Secondary | ICD-10-CM | POA: Diagnosis not present

## 2017-04-04 DIAGNOSIS — R011 Cardiac murmur, unspecified: Secondary | ICD-10-CM | POA: Diagnosis not present

## 2017-04-04 DIAGNOSIS — I998 Other disorder of circulatory system: Secondary | ICD-10-CM | POA: Diagnosis not present

## 2017-04-04 DIAGNOSIS — J45909 Unspecified asthma, uncomplicated: Secondary | ICD-10-CM | POA: Insufficient documentation

## 2017-04-04 DIAGNOSIS — I2510 Atherosclerotic heart disease of native coronary artery without angina pectoris: Secondary | ICD-10-CM | POA: Insufficient documentation

## 2017-04-04 DIAGNOSIS — D649 Anemia, unspecified: Secondary | ICD-10-CM | POA: Insufficient documentation

## 2017-04-04 DIAGNOSIS — F329 Major depressive disorder, single episode, unspecified: Secondary | ICD-10-CM | POA: Insufficient documentation

## 2017-04-04 DIAGNOSIS — M79652 Pain in left thigh: Secondary | ICD-10-CM | POA: Insufficient documentation

## 2017-04-04 DIAGNOSIS — I252 Old myocardial infarction: Secondary | ICD-10-CM | POA: Diagnosis not present

## 2017-04-04 DIAGNOSIS — E119 Type 2 diabetes mellitus without complications: Secondary | ICD-10-CM | POA: Diagnosis not present

## 2017-04-04 DIAGNOSIS — Z87891 Personal history of nicotine dependence: Secondary | ICD-10-CM | POA: Diagnosis not present

## 2017-04-04 DIAGNOSIS — J449 Chronic obstructive pulmonary disease, unspecified: Secondary | ICD-10-CM | POA: Diagnosis not present

## 2017-04-04 MED ORDER — FONDAPARINUX SODIUM 7.5 MG/0.6ML ~~LOC~~ SOLN: 7.5000 mg | SUBCUTANEOUS | 2 refills | Status: DC

## 2017-04-04 MED ORDER — CYCLOBENZAPRINE HCL 5 MG PO TABS: 5.0000 mg | ORAL_TABLET | Freq: Three times a day (TID) | ORAL | 0 refills | Status: DC | PRN

## 2017-04-04 MED ORDER — HYDROCODONE-ACETAMINOPHEN 5-325 MG PO TABS: 1.0000 | ORAL_TABLET | Freq: Four times a day (QID) | ORAL | 0 refills | Status: DC | PRN

## 2017-04-04 NOTE — Progress Notes (Signed)
[facility] CONSULT NOTE  Patient Care Team: Lyndon Code, MD as PCP - General (Internal Medicine) Della Goo, RN as [facility] Care Management  CHIEF COMPLAINTS/PURPOSE OF CONSULTATION:  Recurrent DVT/PE  # Summer 2017- LEFT LE DVT- on Eliquis; AUG 2017- CTA-outside scan [LLL/subsegmental PE [stopped eliquis; started Coumadin;  s/p IVC filter [Dr.Dew];]; July 2018- DVT of R LE on Coumadin therapeutic; stopped coumadin; started LOvenox  ]- July 2018- right LE DVT-worsening [while therapeutic on lovenox 1mg /kg BID]; started on Arixtra 7.5mg  SQ daily. FACTOR V LEIDEN HETEROZYGOUS.   # HIV [on HAART; Dr.Fitzgerald]; COPD; CAD  No history exists.     HISTORY OF PRESENTING ILLNESS:  Brian Hampton 61 y.o.  male complicated history of recurrent DVT/PE as outlined above- most recently had a worsening DVT of the right lower extremity while on Lovenox in July 2018. Patient was found to be therapeutic on Lovenox. Patient was then discharged home on Arixtra 7.5 mg subcutaneous daily.  Patient in the interim was evaluated in the emergency room for left lower extremity swelling/chest pain- Doppler negative for any DVT in the left side; CTA negative for any PE.  Some days later  he was again evaluated in the emergency room for  right hand swelling /hematoma.   Patient has chronic swelling of the right lower extremity; also complains of pain in that limb.  Also complains of stiffness in his back. He is requesting pain prescription refill/Flexeril refill.  ROS: A complete 10 point review of system is done which is negative except mentioned above in history of present illness  MEDICAL HISTORY:  Past Medical History:  Diagnosis Date  . Anemia   . Asthma   . Cataracts, bilateral    worse in Rt eye  . Collagen vascular disease (HCC)   . COPD (chronic obstructive pulmonary disease) (HCC)   . Coronary artery disease   . Depression   . DVT (deep venous thrombosis)  (HCC)   . Dysrhythmia   . Emphysema/COPD (HCC)   . Environmental and seasonal allergies   . H/O blood clots   . Heart murmur   . HIV (human immunodeficiency virus infection) (HCC)   . Hypertension   . Leaky heart valve    x 3  . Lung mass   . Myocardial infarction (HCC)    in 2000  . Pneumonia    year ago  . Pulmonary emboli (HCC)   . Type 2 diabetes mellitus (HCC)     SURGICAL HISTORY: Past Surgical History:  Procedure Laterality Date  . ANKLE ARTHROSCOPY    . IVC FILTER INSERTION    . KYPHOPLASTY N/A 12/21/2016   Procedure: KYPHOPLASTY;  Surgeon: Kennedy Bucker, MD;  Location: [facility] ORS;  Service: Orthopedics;  Laterality: N/A;  . PERIPHERAL VASCULAR CATHETERIZATION N/A 03/16/2016   Procedure: IVC Filter Insertion;  Surgeon: Renford Dills, MD;  Location: [facility] INVASIVE CV LAB;  Service: Cardiovascular;  Laterality: N/A;  . SINUS EXPLORATION    . WRIST ARTHROSCOPY Right     SOCIAL HISTORY: Social History   Social History  . Marital status: Widowed    Spouse name: N/A  . Number of children: N/A  . Years of education: N/A   Occupational History  . Not on file.   Social History Main Topics  . Smoking status: Former Smoker    Quit date: 12/16/2001  . Smokeless tobacco: Never Used  . Alcohol use No  . Drug use: No  . Sexual activity:  Not on file   Other Topics Concern  . Not on file   Social History Narrative  . No narrative on file    FAMILY HISTORY: Family History  Problem Relation Age of Onset  . CAD Unknown   . Asthma Mother   . Cirrhosis Father   . Deep vein thrombosis Neg Hx   . Diabetes Neg Hx     ALLERGIES:  is allergic to aspirin; bee venom; penicillins; prednisone; sulfa antibiotics; sulfasalazine; theophyllines; and theophylline.  MEDICATIONS:  Current Outpatient Prescriptions  Medication Sig Dispense Refill  . abacavir-dolutegravir-lamiVUDine (TRIUMEQ) 600-50-300 MG tablet Take 1 tablet by mouth daily after breakfast. Reported on  01/30/2016    . acetaminophen (TYLENOL) 325 MG tablet Take 1 tablet (325 mg total) by mouth every 6 (six) hours as needed for mild pain (or Fever >/= 101).    Marland Kitchen albuterol (PROVENTIL HFA;VENTOLIN HFA) 108 (90 BASE) MCG/ACT inhaler Inhale 2 puffs into the lungs every 6 (six) hours as needed for wheezing or shortness of breath.    Marland Kitchen albuterol (PROVENTIL) (2.5 MG/3ML) 0.083% nebulizer solution Take 2.5 mg by nebulization 3 (three) times daily.     Marland Kitchen atorvastatin (LIPITOR) 10 MG tablet Take 10 mg by mouth daily after breakfast.    . Calcium Carbonate-Vitamin D (CALCIUM 600+D) 600-400 MG-UNIT tablet Take 1 tablet by mouth daily after breakfast.     . carvedilol (COREG) 12.5 MG tablet Take 12.5 mg by mouth 2 (two) times daily with a meal.    . cetirizine (ZYRTEC) 10 MG tablet Take 10 mg by mouth daily as needed for allergies.     Marland Kitchen EPINEPHrine (EPIPEN 2-PAK) 0.3 mg/0.3 mL IJ SOAJ injection Inject 0.3 mg into the muscle once as needed (for severe allergic reaction).    Marland Kitchen escitalopram (LEXAPRO) 20 MG tablet Take 20 mg by mouth daily after breakfast.     . fondaparinux (ARIXTRA) 7.5 MG/0.6ML SOLN injection Inject 0.6 mLs (7.5 mg total) into the skin daily. 30 Syringe 2  . furosemide (LASIX) 20 MG tablet Take 20 mg by mouth daily.     Marland Kitchen guaiFENesin (MUCINEX) 600 MG 12 hr tablet Take 1 tablet (600 mg total) by mouth 2 (two) times daily.    Marland Kitchen HYDROcodone-acetaminophen (NORCO) 5-325 MG tablet Take 1 tablet by mouth every 6 (six) hours as needed for moderate pain. 45 tablet 0  . levothyroxine (SYNTHROID, LEVOTHROID) 50 MCG tablet Take 1 tablet (50 mcg total) by mouth daily before breakfast. 30 tablet 0  . LINZESS 72 MCG capsule Take 72 mcg by mouth daily after breakfast.    . losartan (COZAAR) 25 MG tablet Take 25 mg by mouth every morning.  0  . magnesium oxide (MAG-OX) 400 MG tablet Take 400 mg by mouth daily after breakfast.    . meloxicam (MOBIC) 7.5 MG tablet Take 7.5 mg by mouth daily as needed for pain.     . mirtazapine (REMERON) 15 MG tablet Take 15 mg by mouth daily.     . mometasone-formoterol (DULERA) 200-5 MCG/ACT AERO Inhale 2 puffs into the lungs 3 (three) times daily.     . Multiple Vitamin (MULTIVITAMIN WITH MINERALS) TABS tablet Take 1 tablet by mouth daily. ONE-A-DAY MULTIVITAMIN 50+    . nitroGLYCERIN (NITROSTAT) 0.4 MG SL tablet Place 0.4 mg under the tongue every 5 (five) minutes as needed for chest pain.    Marland Kitchen omeprazole (PRILOSEC) 40 MG capsule Take 40 mg by mouth daily after breakfast.    . Potassium Chloride  CR (MICRO-K) 8 MEQ CPCR capsule CR Take 1 capsule by mouth daily after breakfast.    . sennosides-docusate sodium (SENOKOT-S) 8.6-50 MG tablet Take 1 tablet by mouth at bedtime as needed for constipation.    Marland Kitchen tiotropium (SPIRIVA) 18 MCG inhalation capsule Place 18 mcg into inhaler and inhale daily.    . traMADol (ULTRAM) 50 MG tablet Take by mouth every 6 (six) hours as needed. Pt taking twice a day    . zolpidem (AMBIEN) 5 MG tablet Take 5 mg by mouth at bedtime.    . cyclobenzaprine (FLEXERIL) 5 MG tablet Take 1 tablet (5 mg total) by mouth 3 (three) times daily as needed for muscle spasms. 45 tablet 0   No current facility-administered medications for this visit.       Marland Kitchen  PHYSICAL EXAMINATION: ECOG PERFORMANCE STATUS: 1 - Symptomatic but completely ambulatory  Vitals:   04/04/17 1103  BP: 121/77  Pulse: 87  Resp: 16  Temp: (!) 97.1 F (36.2 C)   Filed Weights   04/04/17 1103  Weight: 184 lb (83.5 kg)    GENERAL: Well-nourished well-developed; Alert, no distress and comfortable.   Accompanied by his sister. EYES: no pallor or icterus OROPHARYNX: no thrush or ulceration; poor dentition  NECK: supple, no masses felt LYMPH:  no palpable lymphadenopathy in the cervical, axillary or inguinal regions LUNGS: Decreased breath sounds to auscultation bilaterally at the bases and  No wheeze or crackles HEART/CVS: regular rate & rhythm and no murmurs; No lower  extremity edema ABDOMEN: abdomen soft, non-tender and normal bowel sounds Musculoskeletal:no cyanosis of digits and no clubbing  PSYCH: alert & oriented x 3 with fluent speech NEURO: no focal motor/sensory deficits SKIN:  Multiple ecchymosis noted.  4-5 cm hematoma noted on the dorsal aspect of the right hand.   LABORATORY DATA:  I have reviewed the data as listed Lab Results  Component Value Date   WBC 8.4 03/30/2017   HGB 12.6 (L) 03/30/2017   HCT 37.1 (L) 03/30/2017   MCV 99.0 03/30/2017   PLT 267 03/30/2017    Recent Labs  05/05/16 1632  12/21/16 2251  03/03/17 2024  03/15/17 0407 03/22/17 1230 03/30/17 1101  NA 146*  < > 137  < > 141  < > 143 142 142  K <2.0*  < > 3.3*  < > 3.6  < > 4.1 4.2 4.9  CL 101  < > 103  < > 106  < > 108 107 108  CO2 33*  < > 27  < > 27  < > 27 25 27   GLUCOSE 115*  < > 144*  < > 113*  < > 137* 140* 119*  BUN 13  < > 22*  < > 17  < > 30* 15 18  CREATININE 1.60*  < > 1.30*  < > 1.49*  < > 1.22 1.31* 1.12  CALCIUM 7.0*  < > 8.7*  < > 9.3  < > 9.0 9.5 9.0  GFRNONAA 46*  < > 59*  < > 50*  < > >60 58* >60  GFRAA 53*  < > >60  < > 58*  < > >60 >60 >60  PROT 7.0  --  7.8  --  7.9  --   --   --   --   ALBUMIN 3.1*  --  3.7  --  3.7  --   --   --   --   AST 41  --  24  --  46*  --   --   --   --   ALT 18  --  21  --  72*  --   --   --   --   ALKPHOS 62  --  198*  --  189*  --   --   --   --   BILITOT 0.4  --  0.6  --  0.6  --   --   --   --   BILIDIR 0.1  --   --   --   --   --   --   --   --   IBILI 0.3  --   --   --   --   --   --   --   --   < > = values in this interval not displayed.  RADIOGRAPHIC STUDIES: I have personally reviewed the radiological images as listed and agreed with the findings in the report. Ct Angio Head W Or Wo Contrast  Result Date: 03/13/2017 CLINICAL DATA:  61 y/o  M; 1 hour of right-sided numbness. EXAM: CT ANGIOGRAPHY HEAD TECHNIQUE: Multidetector CT imaging of the head was performed using the standard protocol during  bolus administration of intravenous contrast. Multiplanar CT image reconstructions and MIPs were obtained to evaluate the vascular anatomy. CONTRAST:  75 cc Isovue 370 COMPARISON:  01/30/2016 MRI of the head.  03/13/2017 CT of the head. FINDINGS: CTA HEAD Anterior circulation: No significant stenosis, proximal occlusion, aneurysm, or vascular malformation. Posterior circulation: No significant stenosis, proximal occlusion, aneurysm, or vascular malformation. Venous sinuses: As permitted by contrast timing, patent. Anatomic variants: Bilateral fetal PCA. Small caliber vertebrobasilar system with similar small flow voids on the prior MRI of the brain, likely anatomic variant due to bilateral fetal PCA. Delayed phase: No abnormal intracranial enhancement. IMPRESSION: Patent circle of Willis. No large vessel occlusion, aneurysm, or significant stenosis. Variant anatomy with bilateral fetal PCA and small caliber vertebrobasilar system. Electronically Signed   By: [name] M.D.   On: 03/13/2017 16:38   Dg Chest 2 View  Result Date: 03/22/2017 CLINICAL DATA:  Central chest pain. EXAM: CHEST  2 VIEW COMPARISON:  03/13/2017 FINDINGS: Heart and mediastinal contours are within normal limits. No focal opacities or effusions. No acute bony abnormality. IMPRESSION: No active cardiopulmonary disease. Electronically Signed   By: Charlett Nose M.D.   On: 03/22/2017 13:32   Dg Chest 2 View  Result Date: 03/13/2017 CLINICAL DATA:  Former smoker hx of COPD, asthma, CAD admitted with multiple blood clots (per patient). EXAM: CHEST  2 VIEW COMPARISON:  Chest x-ray dated 11/26/2016. Chest CT dated 03/03/2017. FINDINGS: Heart size and mediastinal contours are within normal limits. Lungs appear clear. No pleural effusion or pneumothorax seen. Stable mild compression deformity of a mid thoracic vertebral body. No acute or suspicious osseous finding seen. IVC filter noted in the upper abdomen. IMPRESSION: No active  cardiopulmonary disease. No evidence of pneumonia or pulmonary edema. Electronically Signed   By: Bary Richard M.D.   On: 03/13/2017 11:27   Ct Head Wo Contrast  Result Date: 03/13/2017 CLINICAL DATA:  Right sided numbness x 1 hour ago EXAM: CT HEAD WITHOUT CONTRAST TECHNIQUE: Contiguous axial images were obtained from the base of the skull through the vertex without intravenous contrast. COMPARISON:  02/01/2016 FINDINGS: Brain: There is mild central and cortical atrophy. Mild periventricular white matter changes are consistent with small vessel disease. There is no intra or extra-axial fluid collection or mass  lesion. The basilar cisterns and ventricles have a normal appearance. There is no CT evidence for acute infarction or hemorrhage. Vascular: No hyperdense vessel or unexpected calcification. Skull: No acute abnormality. Sinuses/Orbits: No acute finding. Other: None IMPRESSION: 1.  No evidence for acute  abnormality. 2. Mild atrophy and small vessel disease. Electronically Signed   By: Norva Pavlov M.D.   On: 03/13/2017 16:03   Ct Angio Chest Pe W And/or Wo Contrast  Result Date: 03/30/2017 CLINICAL DATA:  Chest pain and shortness of breath.  Left leg pain. EXAM: CT ANGIOGRAPHY CHEST WITH CONTRAST TECHNIQUE: Multidetector CT imaging of the chest was performed using the standard protocol during bolus administration of intravenous contrast. Multiplanar CT image reconstructions and MIPs were obtained to evaluate the vascular anatomy. CONTRAST:  75 cc Isovue 370 COMPARISON:  03/22/2017 FINDINGS: Cardiovascular: Mediastinum/Nodes: No enlarged mediastinal, hilar, or axillary lymph nodes. Thyroid gland, trachea, and esophagus demonstrate no significant findings. Lungs/Pleura: Lungs are clear. No pleural effusion or pneumothorax. Upper Abdomen: No acute abnormality. Musculoskeletal: No chest wall abnormality. No acute abnormalities. Old compression fractures in the midthoracic spine with previous  vertebroplasty at T6. Review of the MIP images confirms the above findings. IMPRESSION: No acute abnormalities.  No evidence of acute pulmonary embolism. Electronically Signed   By: Francene Boyers M.D.   On: 03/30/2017 12:31   Ct Angio Chest Pe W And/or Wo Contrast  Result Date: 03/22/2017 CLINICAL DATA:  Central chest pain EXAM: CT ANGIOGRAPHY CHEST WITH CONTRAST TECHNIQUE: Multidetector CT imaging of the chest was performed using the standard protocol during bolus administration of intravenous contrast. Multiplanar CT image reconstructions and MIPs were obtained to evaluate the vascular anatomy. CONTRAST:  75 mL Isovue 370. COMPARISON:  03/03/2017, 06/24/2015 FINDINGS: Cardiovascular: Thoracic aorta is well visualize without evidence of aneurysmal dilatation or dissection. No significant calcific changes are seen. Mild coronary calcifications are noted. The pulmonary artery shows a normal branching pattern bilaterally without definitive pulmonary embolus. Mediastinum/Nodes: The thoracic inlet is within normal limits. No significant hilar or mediastinal adenopathy is noted. Lungs/Pleura: Lungs are well aerated bilaterally. A tiny nodule is noted in the left upper lobe best seen on image number 45 of series 6. This is better visualized on the current examination when compared with the most recent study but is in retrospect stable from 2016. No focal infiltrate or sizable effusion is noted. Upper Abdomen: Upper abdomen shows no focal abnormality. Musculoskeletal: Degenerative changes of the thoracic spine are seen. Some old healed rib fractures are noted on the right changes of prior vertebral augmentation at T6 with chronic appearing T8 compression deformity is seen. Partial anterior fusion is noted at C4-5. Review of the MIP images confirms the above findings. IMPRESSION: No evidence of pulmonary emboli. No acute abnormality is noted. Chronic changes as described above. Electronically Signed   By: Alcide Clever  M.D.   On: 03/22/2017 16:32   Mr Maxine Glenn Head Wo Contrast  Result Date: 03/14/2017 CLINICAL DATA:  61 y/o M; increasing weakness in the right upper and lower extremities. EXAM: MRI HEAD WITHOUT CONTRAST MRA HEAD WITHOUT CONTRAST TECHNIQUE: Multiplanar, multiecho pulse sequences of the brain and surrounding structures were obtained without intravenous contrast. Angiographic images of the head were obtained using MRA technique without contrast. COMPARISON:  03/13/2017 CT angiogram of the head. FINDINGS: MRI HEAD FINDINGS Brain: No acute infarction, hemorrhage, hydrocephalus, extra-axial collection or mass lesion. Several nonspecific foci of T2 FLAIR hyperintense signal abnormality in subcortical and periventricular white matter is compatible with  moderate chronic microvascular ischemic changes for age. Mild brain parenchymal volume loss. Vascular: As below. Skull and upper cervical spine: Mild cranial settling likely related to history of collagen vascular disease, no significant stenosis of the foramen magnum. Right frontal bone T1 hyperintense focus compatible with hemangioma. Sinuses/Orbits: Post bilateral maxillary antrostomy and ethmoidectomy. No significant paranasal sinus mucosal thickening. No abnormal signal of mastoid air cells. Bilateral intra-ocular lens replacement. Other: None. MRA HEAD FINDINGS Internal carotid arteries:  Patent. Anterior cerebral arteries:  Patent. Middle cerebral arteries: Patent. Anterior communicating artery: Motion degraded. Posterior communicating arteries:  Patent. Posterior cerebral arteries:  Patent.  Bilateral fetal PCA. Basilar artery:  Patent.  Small in caliber. Vertebral arteries:  Patent.  Small in caliber. Motion degraded study, suboptimal assessment for subtle stenosis or small 2-3 mm aneurysm. IMPRESSION: MRI head: 1. No acute intracranial abnormality. 2. Moderate for age chronic microvascular ischemic changes and mild parenchymal volume loss of the brain. 3. Cranial  settling likely related to collagen vascular disease, no significant stenosis of foramen magnum. MRA head: 1. Motion degraded study, suboptimal assessment for subtle stenosis or small aneurysm. 2. Patent circle of Willis. No large vessel occlusion, aneurysm, or significant stenosis is identified. Electronically Signed   By: [name] M.D.   On: 03/14/2017 16:11   Mr Brain Wo Contrast  Result Date: 03/14/2017 CLINICAL DATA:  61 y/o M; increasing weakness in the right upper and lower extremities. EXAM: MRI HEAD WITHOUT CONTRAST MRA HEAD WITHOUT CONTRAST TECHNIQUE: Multiplanar, multiecho pulse sequences of the brain and surrounding structures were obtained without intravenous contrast. Angiographic images of the head were obtained using MRA technique without contrast. COMPARISON:  03/13/2017 CT angiogram of the head. FINDINGS: MRI HEAD FINDINGS Brain: No acute infarction, hemorrhage, hydrocephalus, extra-axial collection or mass lesion. Several nonspecific foci of T2 FLAIR hyperintense signal abnormality in subcortical and periventricular white matter is compatible with moderate chronic microvascular ischemic changes for age. Mild brain parenchymal volume loss. Vascular: As below. Skull and upper cervical spine: Mild cranial settling likely related to history of collagen vascular disease, no significant stenosis of the foramen magnum. Right frontal bone T1 hyperintense focus compatible with hemangioma. Sinuses/Orbits: Post bilateral maxillary antrostomy and ethmoidectomy. No significant paranasal sinus mucosal thickening. No abnormal signal of mastoid air cells. Bilateral intra-ocular lens replacement. Other: None. MRA HEAD FINDINGS Internal carotid arteries:  Patent. Anterior cerebral arteries:  Patent. Middle cerebral arteries: Patent. Anterior communicating artery: Motion degraded. Posterior communicating arteries:  Patent. Posterior cerebral arteries:  Patent.  Bilateral fetal PCA. Basilar artery:   Patent.  Small in caliber. Vertebral arteries:  Patent.  Small in caliber. Motion degraded study, suboptimal assessment for subtle stenosis or small 2-3 mm aneurysm. IMPRESSION: MRI head: 1. No acute intracranial abnormality. 2. Moderate for age chronic microvascular ischemic changes and mild parenchymal volume loss of the brain. 3. Cranial settling likely related to collagen vascular disease, no significant stenosis of foramen magnum. MRA head: 1. Motion degraded study, suboptimal assessment for subtle stenosis or small aneurysm. 2. Patent circle of Willis. No large vessel occlusion, aneurysm, or significant stenosis is identified. Electronically Signed   By: [name] M.D.   On: 03/14/2017 16:11   US Abdomen Complete  Result Date: 03/13/2017 CLINICAL DATA:  Nonspecific abdominal pain beginning at 1 p.m. EXAM: ABDOMEN ULTRASOUND COMPLETE COMPARISON:  CT abdomen and pelvis May 05, 2017 FINDINGS: Limited examination due to body habitus and, bowel gas. Gallbladder: Contracted resulting in gallbladder wall thickening. No cholelithiasis. No sonographic Murphy sign  noted by sonographer. Common bile duct: Diameter: 3 mm. Liver: No focal lesion identified. Within normal limits in parenchymal echogenicity. Hepatopetal portal vein. IVC: No abnormality visualized. Pancreas: Predominately obscured. Spleen: Size and appearance within normal limits. Right Kidney: Length: 9.7 cm. Echogenicity within normal limits. No mass or hydronephrosis visualized. Left Kidney: Length: 10.5 cm. Echogenicity within normal limits. No mass or hydronephrosis visualized. Abdominal aorta: No aneurysm visualized.  Distal aorta obscured. Other findings: None. IMPRESSION: No acute abdominal process. Electronically Signed   By: Awilda Metro M.D.   On: 03/13/2017 20:32   Ct Femur Left Wo Contrast  Result Date: 03/22/2017 CLINICAL DATA:  Left thigh pain for 3 days.  No known injury. EXAM: CT OF THE LOWER LEFT EXTREMITY  WITHOUT CONTRAST TECHNIQUE: Multidetector CT imaging of the lower left extremity was performed according to the standard protocol. COMPARISON:  Hila the FINDINGS: The hip and knee joints are maintained. Mild degenerative changes. No findings for hip AVN. No femur fracture or bone lesion. Bony density near the anterior inferior iliac spine on the left likely an old avulsion injury. The hip and pelvic musculature appear normal. No obvious muscle tear or hematoma. No significant intrapelvic findings. Contrast noted in the bladder from the recent chest CT. No significant intrapelvic abnormalities. No inguinal mass or adenopathy. Small bilateral hydroceles are noted. IMPRESSION: 1. Mild hip and knee joint degenerative changes but no acute bony findings involving the left femur. 2. Unremarkable appearance of the hip and pelvic musculature. 3. No significant intrapelvic abnormalities. Electronically Signed   By: Rudie Meyer M.D.   On: 03/22/2017 19:48   US Carotid Bilateral  Result Date: 03/14/2017 CLINICAL DATA:  61 year old male with symptoms of transient ischemic attack EXAM: BILATERAL CAROTID DUPLEX ULTRASOUND TECHNIQUE: Wallace Cullens scale imaging, color Doppler and duplex ultrasound were performed of bilateral carotid and vertebral arteries in the neck. COMPARISON:  None. FINDINGS: Criteria: Quantification of carotid stenosis is based on velocity parameters that correlate the residual internal carotid diameter with NASCET-based stenosis levels, using the diameter of the distal internal carotid lumen as the denominator for stenosis measurement. The following velocity measurements were obtained: RIGHT ICA:  101/24 cm/sec CCA:  100/15 cm/sec SYSTOLIC ICA/CCA RATIO:  1.0 DIASTOLIC ICA/CCA RATIO:  1.6 ECA:  131 cm/sec LEFT ICA:  69/19 cm/sec CCA:  126/23 cm/sec SYSTOLIC ICA/CCA RATIO:  0.6 DIASTOLIC ICA/CCA RATIO:  0.8 ECA:  100 cm/sec RIGHT CAROTID ARTERY: Smooth heterogeneous atherosclerotic plaque in the proximal internal  carotid artery. By peak systolic velocity criteria, the estimated stenosis remains less than 50%. RIGHT VERTEBRAL ARTERY:  Patent with antegrade flow. LEFT CAROTID ARTERY: No focal atherosclerotic plaque or evidence of stenosis. LEFT VERTEBRAL ARTERY:  Patent with normal antegrade flow. IMPRESSION: 1. Mild (1-49%) stenosis proximal right internal carotid artery secondary to smooth heterogeneous atherosclerotic plaque. 2. No significant atherosclerotic plaque or evidence of stenosis in the left internal carotid artery. 3. Vertebral arteries are patent with normal antegrade flow. Signed, Sterling Big, MD [facility] Electronically Signed   By: Malachy Moan M.D.   On: 03/14/2017 15:08   US Venous Img Lower Bilateral  Result Date: 03/22/2017 CLINICAL DATA:  Recent right DVT. New onset severe left lower extremity pain. EXAM: BILATERAL LOWER EXTREMITY VENOUS DOPPLER ULTRASOUND TECHNIQUE: Gray-scale sonography with graded compression, as well as color Doppler and duplex ultrasound were performed to evaluate the lower extremity deep venous systems from the level of the common femoral vein and including the common femoral,  femoral, profunda femoral, popliteal and calf veins including the posterior tibial, peroneal and gastrocnemius veins when visible. The superficial great saphenous vein was also interrogated. Spectral Doppler was utilized to evaluate flow at rest and with distal augmentation maneuvers in the common femoral, femoral and popliteal veins. COMPARISON:  March 12, 2017. FINDINGS: RIGHT LOWER EXTREMITY Common Femoral Vein: No evidence of thrombus. Normal compressibility, respiratory phasicity and response to augmentation. Saphenofemoral Junction: No evidence of thrombus. Normal compressibility and flow on color Doppler imaging. Profunda Femoral Vein: Resolved nonocclusive thrombus. No evidence of new thrombus. Normal compressibility and  flow on color Doppler imaging. Femoral Vein: Occlusive thrombus in the mid to distal femoral vein, similar to prior study. Popliteal Vein: Nonocclusive thrombus within the popliteal vein, improved when compared to prior study. Calf Veins: Resolved nonocclusive thrombus in the posterior tibial vein. No evidence of new thrombus. Normal compressibility and flow on color Doppler imaging. Superficial Great Saphenous Vein: No evidence of thrombus. Normal compressibility and flow on color Doppler imaging. Venous Reflux:  None. Other Findings:  None. LEFT LOWER EXTREMITY Common Femoral Vein: No evidence of thrombus. Normal compressibility, respiratory phasicity and response to augmentation. Saphenofemoral Junction: No evidence of thrombus. Normal compressibility and flow on color Doppler imaging. Profunda Femoral Vein: No evidence of thrombus. Normal compressibility and flow on color Doppler imaging. Femoral Vein: No evidence of thrombus. Normal compressibility, respiratory phasicity and response to augmentation. Popliteal Vein: No evidence of thrombus. Normal compressibility, respiratory phasicity and response to augmentation. Calf Veins: No evidence of thrombus. Normal compressibility and flow on color Doppler imaging. Superficial Great Saphenous Vein: No evidence of thrombus. Normal compressibility and flow on color Doppler imaging. Venous Reflux:  None. Other Findings:  None. IMPRESSION: 1. Improved clot burden within the right lower extremity, with resolved nonocclusive thrombus in the profunda femoral and posterior tibial veins. Decreased thrombus burden within the popliteal vein, now nonocclusive. Persistent occlusive thrombus within the mid to distal femoral vein. 2. No evidence of deep venous thrombosis within the left lower extremity. Electronically Signed   By: Obie Dredge M.D.   On: 03/22/2017 17:27   US Venous Img Lower Bilateral  Result Date: 03/12/2017 CLINICAL DATA:  Hospitalized 2 weeks ago for right  leg DVT. Assess deep venous thrombosis. Initial encounter. EXAM: BILATERAL LOWER EXTREMITY VENOUS DOPPLER ULTRASOUND TECHNIQUE: Gray-scale sonography with graded compression, as well as color Doppler and duplex ultrasound were performed to evaluate the lower extremity deep venous systems from the level of the common femoral vein and including the common femoral, femoral, profunda femoral, popliteal and calf veins including the posterior tibial, peroneal and gastrocnemius veins when visible. The superficial great saphenous vein was also interrogated. Spectral Doppler was utilized to evaluate flow at rest and with distal augmentation maneuvers in the common femoral, femoral and popliteal veins. COMPARISON:  Right lower extremity venous Doppler ultrasound performed 02/25/2017 FINDINGS: RIGHT LOWER EXTREMITY Common Femoral Vein: No evidence of thrombus. Normal compressibility, respiratory phasicity and response to augmentation. Saphenofemoral Junction: No evidence of thrombus. Normal compressibility and flow on color Doppler imaging. Profunda Femoral Vein: Nonocclusive thrombus is noted within the profunda femoral vein, new from the prior study. Femoral Vein: Occlusive thrombus is noted within the femoral vein, similar to the prior study. Popliteal Vein: Occlusive thrombus noted within the popliteal vein. Calf Veins: Nonocclusive thrombus is noted within the posterior tibial vein. The peroneal vein is patent, new from the prior study. Superficial Great Saphenous Vein: No evidence of thrombus. Normal compressibility and flow on color  Doppler imaging. Venous Reflux:  None. Other Findings:  None. LEFT LOWER EXTREMITY Common Femoral Vein: No evidence of thrombus. Normal compressibility, respiratory phasicity and response to augmentation. Saphenofemoral Junction: No evidence of thrombus. Normal compressibility and flow on color Doppler imaging. Profunda Femoral Vein: No evidence of thrombus. Normal compressibility and flow  on color Doppler imaging. Femoral Vein: No evidence of thrombus. Normal compressibility, respiratory phasicity and response to augmentation. Popliteal Vein: No evidence of thrombus. Normal compressibility, respiratory phasicity and response to augmentation. Calf Veins: No evidence of thrombus. Normal compressibility and flow on color Doppler imaging. Superficial Great Saphenous Vein: No evidence of thrombus. Normal compressibility and flow on color Doppler imaging. Venous Reflux:  None. Other Findings:  None. IMPRESSION: 1. Deep venous thrombosis noted within the right profunda femoral vein, femoral vein, popliteal vein and posterior tibial vein, with occlusion at the mid to distal femoral vein and proximal popliteal vein. This has progressed slightly proximally since the prior study. 2. No evidence of DVT at the left lower extremity. These results were called by telephone at the time of interpretation on 03/12/2017 at 5:40 am to Dr. Merrily Brittle, who verbally acknowledged these results. Electronically Signed   By: Roanna Raider M.D.   On: 03/12/2017 05:42   US Venous Img Lower Unilateral Left  Result Date: 03/30/2017 CLINICAL DATA:  Left leg pain and swelling. EXAM: LEFT LOWER EXTREMITY VENOUS DOPPLER ULTRASOUND TECHNIQUE: Gray-scale sonography with graded compression, as well as color Doppler and duplex ultrasound were performed to evaluate the lower extremity deep venous systems from the level of the common femoral vein and including the common femoral, femoral, profunda femoral, popliteal and calf veins including the posterior tibial, peroneal and gastrocnemius veins when visible. The superficial great saphenous vein was also interrogated. Spectral Doppler was utilized to evaluate flow at rest and with distal augmentation maneuvers in the common femoral, femoral and popliteal veins. COMPARISON:  None. FINDINGS: Contralateral Common Femoral Vein: Respiratory phasicity is normal and symmetric with the  symptomatic side. No evidence of thrombus. Normal compressibility. Common Femoral Vein: No evidence of thrombus. Normal compressibility, respiratory phasicity and response to augmentation. Saphenofemoral Junction: No evidence of thrombus. Normal compressibility and flow on color Doppler imaging. Profunda Femoral Vein: No evidence of thrombus. Normal compressibility and flow on color Doppler imaging. Femoral Vein: No evidence of thrombus. Normal compressibility, respiratory phasicity and response to augmentation. Popliteal Vein: No evidence of thrombus. Normal compressibility, respiratory phasicity and response to augmentation. Calf Veins: No evidence of thrombus. Normal compressibility and flow on color Doppler imaging. Superficial Great Saphenous Vein: No evidence of thrombus. Normal compressibility and flow on color Doppler imaging. Venous Reflux:  None. Other Findings:  None. IMPRESSION: No evidence of DVT within the left lower extremity. 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 Patient currently on atrixtra 7.5 mg a day. No clinical progression of DVT or no PE. Patient tolerating anticoagulation fairly well- except for hand hematoma [C discussion below]. I would not recommend a dose reductions given his difficulty with anticoagulation.  # Right UE/hand swelling hematoma- on anticoagulation/question venipuncture. Recommend ice packs.   # Right lower extremity swelling-pain- DVT/ component of postphlebitic syndrome. Recommend stockings/leg elevation. Given a prescription for hydrocodone/Flexeril however this is to  be filled with his PCP moving forward.   # Follow up in 2 months/cbc/bmp  Cc; Dr.Fozia Welton Flakes.   All questions were  answered. The patient knows to call the clinic with any problems, questions or concerns.    Earna Coder, MD 04/04/2017 12:49 PM

## 2017-04-04 NOTE — Assessment & Plan Note (Signed)
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 Patient currently on atrixtra 7.5 mg a day. No clinical progression of DVT or no PE. Patient tolerating anticoagulation fairly well- except for hand hematoma [C discussion below]. I would not recommend a dose reductions given his difficulty with anticoagulation.  # Right UE/hand swelling hematoma- on anticoagulation/question venipuncture. Recommend ice packs.   # Right lower extremity swelling-pain- DVT/ component of postphlebitic syndrome. Recommend stockings/leg elevation. Given a prescription for hydrocodone/Flexeril however this is to be filled with his PCP moving forward.   # Follow up in 2 months/cbc/bmp  Cc; Dr.Fozia Welton Flakes.

## 2017-04-05 DIAGNOSIS — J439 Emphysema, unspecified: Secondary | ICD-10-CM | POA: Diagnosis not present

## 2017-04-05 DIAGNOSIS — I129 Hypertensive chronic kidney disease with stage 1 through stage 4 chronic kidney disease, or unspecified chronic kidney disease: Secondary | ICD-10-CM | POA: Diagnosis not present

## 2017-04-05 DIAGNOSIS — I2510 Atherosclerotic heart disease of native coronary artery without angina pectoris: Secondary | ICD-10-CM | POA: Diagnosis not present

## 2017-04-05 DIAGNOSIS — E1122 Type 2 diabetes mellitus with diabetic chronic kidney disease: Secondary | ICD-10-CM | POA: Diagnosis not present

## 2017-04-05 DIAGNOSIS — N189 Chronic kidney disease, unspecified: Secondary | ICD-10-CM | POA: Diagnosis not present

## 2017-04-05 DIAGNOSIS — I82401 Acute embolism and thrombosis of unspecified deep veins of right lower extremity: Secondary | ICD-10-CM | POA: Diagnosis not present

## 2017-04-07 ENCOUNTER — Other Ambulatory Visit: Payer: Self-pay | Admitting: *Deleted

## 2017-04-07 DIAGNOSIS — F329 Major depressive disorder, single episode, unspecified: Secondary | ICD-10-CM | POA: Diagnosis not present

## 2017-04-07 DIAGNOSIS — J449 Chronic obstructive pulmonary disease, unspecified: Secondary | ICD-10-CM | POA: Diagnosis not present

## 2017-04-07 DIAGNOSIS — M545 Low back pain: Secondary | ICD-10-CM | POA: Diagnosis not present

## 2017-04-07 DIAGNOSIS — B20 Human immunodeficiency virus [HIV] disease: Secondary | ICD-10-CM | POA: Diagnosis not present

## 2017-04-07 DIAGNOSIS — I803 Phlebitis and thrombophlebitis of lower extremities, unspecified: Secondary | ICD-10-CM | POA: Diagnosis not present

## 2017-04-07 DIAGNOSIS — E039 Hypothyroidism, unspecified: Secondary | ICD-10-CM | POA: Diagnosis not present

## 2017-04-07 DIAGNOSIS — M79669 Pain in unspecified lower leg: Secondary | ICD-10-CM | POA: Diagnosis not present

## 2017-04-07 NOTE — Patient Outreach (Signed)
Successful telephone encounter to Brian Hampton, 61 year old male for transition of care, ongoing follow up on recent hospitalization August 14-15,2018 for DVT right lower extremity, weakness and recent ED visit 04/01/17 for right hand hematoma.   Spoke with pt, HIPAA identifiers verified.  [facility] Epic [facility]04/[facility]), was told blood clots in both legs, refilled pain and Flexeril medications which he is taking daily- helping.   Pt reports on recent ED visit- right hand hematoma, swelling has gone done/applying ice as instructed by ED MD.   Pt reports BP still up and down, Heart MD gave him an additional BP medication, to see Heart MD 04/14/17.   Pt reports taking all medications as ordered.   Pt reports has a little edema in right lower extremity, weight down, appetite good.  Pt reports Home health came out but he was at MD- to come again next week.   Pt reports on upcoming MD appointments: Lung MD today and 9/6, Vein specialist MD 9/12.     Plan:  As discussed with pt, plan to follow up again next week telephonically (part of ongoing transition of care).   Shayne Alkenose M.   Pierzchala RN CCM [facility]  [phone]

## 2017-04-08 DIAGNOSIS — J439 Emphysema, unspecified: Secondary | ICD-10-CM | POA: Diagnosis not present

## 2017-04-08 DIAGNOSIS — I2510 Atherosclerotic heart disease of native coronary artery without angina pectoris: Secondary | ICD-10-CM | POA: Diagnosis not present

## 2017-04-08 DIAGNOSIS — I129 Hypertensive chronic kidney disease with stage 1 through stage 4 chronic kidney disease, or unspecified chronic kidney disease: Secondary | ICD-10-CM | POA: Diagnosis not present

## 2017-04-08 DIAGNOSIS — E1122 Type 2 diabetes mellitus with diabetic chronic kidney disease: Secondary | ICD-10-CM | POA: Diagnosis not present

## 2017-04-08 DIAGNOSIS — N189 Chronic kidney disease, unspecified: Secondary | ICD-10-CM | POA: Diagnosis not present

## 2017-04-08 DIAGNOSIS — I82401 Acute embolism and thrombosis of unspecified deep veins of right lower extremity: Secondary | ICD-10-CM | POA: Diagnosis not present

## 2017-04-12 DIAGNOSIS — N189 Chronic kidney disease, unspecified: Secondary | ICD-10-CM | POA: Diagnosis not present

## 2017-04-12 DIAGNOSIS — J439 Emphysema, unspecified: Secondary | ICD-10-CM | POA: Diagnosis not present

## 2017-04-12 DIAGNOSIS — I82401 Acute embolism and thrombosis of unspecified deep veins of right lower extremity: Secondary | ICD-10-CM | POA: Diagnosis not present

## 2017-04-12 DIAGNOSIS — I129 Hypertensive chronic kidney disease with stage 1 through stage 4 chronic kidney disease, or unspecified chronic kidney disease: Secondary | ICD-10-CM | POA: Diagnosis not present

## 2017-04-12 DIAGNOSIS — I2510 Atherosclerotic heart disease of native coronary artery without angina pectoris: Secondary | ICD-10-CM | POA: Diagnosis not present

## 2017-04-12 DIAGNOSIS — E1122 Type 2 diabetes mellitus with diabetic chronic kidney disease: Secondary | ICD-10-CM | POA: Diagnosis not present

## 2017-04-12 NOTE — Patient Outreach (Signed)
Error entry.   Rose M.   Pierzchala RN CCM [facility] Care Management  [phone]  

## 2017-04-13 ENCOUNTER — Telehealth (INDEPENDENT_AMBULATORY_CARE_PROVIDER_SITE_OTHER): Payer: Self-pay | Admitting: Vascular Surgery

## 2017-04-13 NOTE — Telephone Encounter (Signed)
Would like to know if JD could call in some more FONDAPARINUX. Its an injection that he says JD prescribed when he was in the hospital. He has talked to his PCP and was told to call JD for it. He uses [facility] on [address].

## 2017-04-13 NOTE — Telephone Encounter (Signed)
Called the patient to inform him that his prescription for the Arixtra 10mg  #30, take one tab daily by mouth was called in to his pharmacy [facility] on [address] And it should be ready for pick up in the next hour.

## 2017-04-14 ENCOUNTER — Other Ambulatory Visit: Payer: Self-pay | Admitting: *Deleted

## 2017-04-14 NOTE — Patient Outreach (Signed)
Unsuccessful telephone encounter to Desiree Haneommy Mountz, 61 year old male for transition of care, ongoing follow up on recent hospitalization August 14-15,2018 for DVT right lower extremity, weakness,recent ED visit for right hand hematoma.  2 attempts made- unable to leave a voice message as message on phone- voice message not set up.    Plan:  RN CM to follow up with pt in 4 days- final transition of care call.     Shayne Alkenose M.   Pierzchala RN CCM [facility]  [phone]

## 2017-04-18 ENCOUNTER — Ambulatory Visit: Payer: Self-pay | Admitting: *Deleted

## 2017-04-18 ENCOUNTER — Other Ambulatory Visit: Payer: Self-pay | Admitting: *Deleted

## 2017-04-18 NOTE — Patient Outreach (Signed)
Successful telephone encounter to Brian Hampton, 61 year old male for transition of care(final call)ongoing follow up on recent hospitalization August 14-15,2018 for DVT right lower extremity,weakness.   Spoke with pt, HIPAA identifiers verified.   Pt reports right hand hematoma is better/busted/drained.   Pt reports on recent visit with Lung MD, keeping an eye on valve leaks.  Pt reports continues with pain in legs,right being the worse, continues to take pain medication/Flexeril- helps.  Pt reports still has edema, will continue until something is done, to see Dr. Driscilla Grammesew's PA and vascular surgeon this week, per Dr. Wyn Quakerew either to have stents in or replace veins/per pt had 13 DVT last August, 6 last month.  Pt reports continues to take Arixtra as ordered.  Pt reports BP up and down, had it all of his life, to see PCP today.    Plan:  As discussed with pt, today final transition of care call, plan to follow up again next month telephonically- check on status, see if home visit needed.     Shayne Alkenose M.   Pierzchala RN CCM [facility]  [phone]

## 2017-04-20 ENCOUNTER — Encounter (INDEPENDENT_AMBULATORY_CARE_PROVIDER_SITE_OTHER): Payer: Self-pay

## 2017-04-21 ENCOUNTER — Other Ambulatory Visit (INDEPENDENT_AMBULATORY_CARE_PROVIDER_SITE_OTHER): Payer: Self-pay | Admitting: Vascular Surgery

## 2017-04-21 DIAGNOSIS — M79605 Pain in left leg: Secondary | ICD-10-CM

## 2017-04-22 ENCOUNTER — Encounter (INDEPENDENT_AMBULATORY_CARE_PROVIDER_SITE_OTHER): Payer: Medicare Other | Admitting: Vascular Surgery

## 2017-04-22 ENCOUNTER — Ambulatory Visit (INDEPENDENT_AMBULATORY_CARE_PROVIDER_SITE_OTHER): Payer: Medicare Other

## 2017-04-22 ENCOUNTER — Ambulatory Visit (INDEPENDENT_AMBULATORY_CARE_PROVIDER_SITE_OTHER): Payer: Medicare Other | Admitting: Vascular Surgery

## 2017-04-22 ENCOUNTER — Encounter (INDEPENDENT_AMBULATORY_CARE_PROVIDER_SITE_OTHER): Payer: Medicare Other

## 2017-04-22 ENCOUNTER — Encounter (INDEPENDENT_AMBULATORY_CARE_PROVIDER_SITE_OTHER): Payer: Self-pay | Admitting: Vascular Surgery

## 2017-04-22 VITALS — BP 119/88 | HR 85 | Resp 17 | Wt 179.0 lb

## 2017-04-22 DIAGNOSIS — R0782 Intercostal pain: Secondary | ICD-10-CM | POA: Diagnosis not present

## 2017-04-22 DIAGNOSIS — M79605 Pain in left leg: Secondary | ICD-10-CM | POA: Diagnosis not present

## 2017-04-22 DIAGNOSIS — I10 Essential (primary) hypertension: Secondary | ICD-10-CM | POA: Diagnosis not present

## 2017-04-22 DIAGNOSIS — I209 Angina pectoris, unspecified: Secondary | ICD-10-CM | POA: Diagnosis not present

## 2017-04-22 DIAGNOSIS — E785 Hyperlipidemia, unspecified: Secondary | ICD-10-CM | POA: Diagnosis not present

## 2017-04-22 DIAGNOSIS — I2510 Atherosclerotic heart disease of native coronary artery without angina pectoris: Secondary | ICD-10-CM | POA: Diagnosis not present

## 2017-04-22 DIAGNOSIS — E119 Type 2 diabetes mellitus without complications: Secondary | ICD-10-CM

## 2017-04-22 DIAGNOSIS — K219 Gastro-esophageal reflux disease without esophagitis: Secondary | ICD-10-CM | POA: Diagnosis not present

## 2017-04-22 DIAGNOSIS — I2699 Other pulmonary embolism without acute cor pulmonale: Secondary | ICD-10-CM | POA: Diagnosis not present

## 2017-04-22 DIAGNOSIS — I25118 Atherosclerotic heart disease of native coronary artery with other forms of angina pectoris: Secondary | ICD-10-CM

## 2017-04-22 DIAGNOSIS — I4891 Unspecified atrial fibrillation: Secondary | ICD-10-CM | POA: Diagnosis not present

## 2017-04-22 NOTE — Assessment & Plan Note (Signed)
His ABIs are completely normal at 1.03 on the right and 1.09 on the left with brisk waveforms consistent with no arterial insufficiency. He was recently in the hospital and has a long history of blood clots, but no acute blood clot at that time. 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 He has tried Neurontin previously. They asked about Lyrica today and I'm giving him a one-time prescription for this. I have recommended he follow up with his primary care physician to discuss his leg pain as I do not think it is vascular in origin although some degree of postphlebitic symptoms could be present. The posterior buttock symptoms would only be treated with compression, elevation, and increasing his activity. I will see him back as needed.

## 2017-04-22 NOTE — Progress Notes (Signed)
MRN : [id number]  Brian Hampton is a 61 y.o. (08-16-1955) male who presents with chief complaint of  Chief Complaint  Patient presents with  . Follow-up    3 week f/u  .  History of Present Illness: Patient returns today in follow up of leg pain. He complains of severe leg pain continually.  He also feels as if his heart was fluttering somewhat. When he had his vitals checked his heart rate was 85 but during his ABIs his heart rate was closer to 140. His right leg is more painful than the left. He has had some swelling although it is not too bad today. His ABIs are completely normal at 1.03 on the right and 1.09 on the left with brisk waveforms consistent with no arterial insufficiency.  Current Outpatient Prescriptions  Medication Sig Dispense Refill  . abacavir-dolutegravir-lamiVUDine (TRIUMEQ) 600-50-300 MG tablet Take 1 tablet by mouth daily after breakfast. Reported on 01/30/2016    . acetaminophen (TYLENOL) 325 MG tablet Take 1 tablet (325 mg total) by mouth every 6 (six) hours as needed for mild pain (or Fever >/= 101).    Marland Kitchen albuterol (PROVENTIL HFA;VENTOLIN HFA) 108 (90 BASE) MCG/ACT inhaler Inhale 2 puffs into the lungs every 6 (six) hours as needed for wheezing or shortness of breath.    Marland Kitchen albuterol (PROVENTIL) (2.5 MG/3ML) 0.083% nebulizer solution Take 2.5 mg by nebulization 3 (three) times daily.     Marland Kitchen atorvastatin (LIPITOR) 10 MG tablet Take 10 mg by mouth daily after breakfast.    . brimonidine (ALPHAGAN) 0.2 % ophthalmic solution     . Calcium Carbonate-Vitamin D (CALCIUM 600+D) 600-400 MG-UNIT tablet Take 1 tablet by mouth daily after breakfast.     . carvedilol (COREG) 12.5 MG tablet Take 12.5 mg by mouth 2 (two) times daily with a meal.    . cetirizine (ZYRTEC) 10 MG tablet Take 10 mg by mouth daily as needed for allergies.     . cyclobenzaprine (FLEXERIL) 5 MG tablet Take 1 tablet (5 mg total) by mouth 3 (three) times daily as needed for muscle spasms. 45 tablet 0  .  docusate sodium (COLACE) 50 MG capsule Take by mouth.    . enoxaparin (LOVENOX) 80 MG/0.8ML injection   0  . EPINEPHrine (EPIPEN 2-PAK) 0.3 mg/0.3 mL IJ SOAJ injection Inject 0.3 mg into the muscle once as needed (for severe allergic reaction).    Marland Kitchen escitalopram (LEXAPRO) 20 MG tablet Take 20 mg by mouth daily after breakfast.     . fondaparinux (ARIXTRA) 7.5 MG/0.6ML SOLN injection Inject 0.6 mLs (7.5 mg total) into the skin daily. 30 Syringe 2  . furosemide (LASIX) 20 MG tablet Take 20 mg by mouth daily.     Marland Kitchen guaiFENesin (MUCINEX) 600 MG 12 hr tablet Take 1 tablet (600 mg total) by mouth 2 (two) times daily.    Marland Kitchen HYDROcodone-acetaminophen (NORCO) 5-325 MG tablet Take 1 tablet by mouth every 6 (six) hours as needed for moderate pain. 45 tablet 0  . ketorolac (ACULAR) 0.5 % ophthalmic solution 1 drop 4 (four) times daily.    Marland Kitchen LINZESS 72 MCG capsule Take 72 mcg by mouth daily after breakfast.    . losartan (COZAAR) 25 MG tablet Take 25 mg by mouth every morning.  0  . magnesium oxide (MAG-OX) 400 MG tablet Take 400 mg by mouth daily after breakfast.    . meloxicam (MOBIC) 7.5 MG tablet Take 7.5 mg by mouth daily as needed for pain.    Marland Kitchen  mirtazapine (REMERON) 15 MG tablet Take 15 mg by mouth daily.     . mometasone-formoterol (DULERA) 200-5 MCG/ACT AERO Inhale 2 puffs into the lungs 3 (three) times daily.     . Multiple Vitamin (MULTIVITAMIN WITH MINERALS) TABS tablet Take 1 tablet by mouth daily. ONE-A-DAY MULTIVITAMIN 50+    . nitroGLYCERIN (NITROSTAT) 0.4 MG SL tablet Place 0.4 mg under the tongue every 5 (five) minutes as needed for chest pain.    Marland Kitchen ofloxacin (OCUFLOX) 0.3 % ophthalmic solution     . omeprazole (PRILOSEC) 40 MG capsule Take 40 mg by mouth daily after breakfast.    . Potassium Chloride CR (MICRO-K) 8 MEQ CPCR capsule CR Take 1 capsule by mouth daily after breakfast.    . sennosides-docusate sodium (SENOKOT-S) 8.6-50 MG tablet Take 1 tablet by mouth at bedtime as needed for  constipation.    Marland Kitchen tiotropium (SPIRIVA) 18 MCG inhalation capsule Place 18 mcg into inhaler and inhale daily.    . traMADol (ULTRAM) 50 MG tablet Take by mouth every 6 (six) hours as needed. Pt taking twice a day    . zolpidem (AMBIEN) 5 MG tablet Take 5 mg by mouth at bedtime.    Marland Kitchen levothyroxine (SYNTHROID, LEVOTHROID) 50 MCG tablet Take 1 tablet (50 mcg total) by mouth daily before breakfast. 30 tablet 0   No current facility-administered medications for this visit.     Past Medical History:  Diagnosis Date  . Anemia   . Asthma   . Cataracts, bilateral    worse in Rt eye  . Collagen vascular disease (HCC)   . COPD (chronic obstructive pulmonary disease) (HCC)   . Coronary artery disease   . Depression   . DVT (deep venous thrombosis) (HCC)   . Dysrhythmia   . Emphysema/COPD (HCC)   . Environmental and seasonal allergies   . H/O blood clots   . Heart murmur   . HIV (human immunodeficiency virus infection) (HCC)   . Hypertension   . Leaky heart valve    x 3  . Lung mass   . Myocardial infarction (HCC)    in 2000  . Pneumonia    year ago  . Pulmonary emboli (HCC)   . Type 2 diabetes mellitus (HCC)     Past Surgical History:  Procedure Laterality Date  . ANKLE ARTHROSCOPY    . IVC FILTER INSERTION    . KYPHOPLASTY N/A 12/21/2016   Procedure: KYPHOPLASTY;  Surgeon: Kennedy Bucker, MD;  Location: [facility] ORS;  Service: Orthopedics;  Laterality: N/A;  . PERIPHERAL VASCULAR CATHETERIZATION N/A 03/16/2016   Procedure: IVC Filter Insertion;  Surgeon: Renford Dills, MD;  Location: [facility] INVASIVE CV LAB;  Service: Cardiovascular;  Laterality: N/A;  . SINUS EXPLORATION    . WRIST ARTHROSCOPY Right     Social History Social History  Substance Use Topics  . Smoking status: Former Smoker    Quit date: 12/16/2001  . Smokeless tobacco: Never Used  . Alcohol use No    Family History Family History  Problem Relation Age of Onset  . CAD Unknown   . Asthma Mother   . Cirrhosis  Father   . Deep vein thrombosis Neg Hx   . Diabetes Neg Hx     Allergies  Allergen Reactions  . Aspirin Anaphylaxis  . Bee Venom Anaphylaxis  . Penicillins Anaphylaxis and Other (See Comments)    Has patient had a PCN reaction causing immediate rash, facial/tongue/throat swelling, SOB or lightheadedness with hypotension: Yes Has  patient had a PCN reaction causing severe rash involving mucus membranes or skin necrosis: No Has patient had a PCN reaction that required hospitalization No Has patient had a PCN reaction occurring within the last 10 years: Yes If all of the above answers are "NO", then may proceed with Cephalosporin use.  . Prednisone Anaphylaxis  . Sulfa Antibiotics Anaphylaxis  . Sulfasalazine Anaphylaxis  . Theophyllines Anaphylaxis  . Theophylline Swelling     REVIEW OF SYSTEMS (Negative unless checked)  Constitutional: '[]'$ Weight loss  '[]'$ Fever  '[]'$ Chills Cardiac: '[]'$ Chest pain   '[]'$ Chest pressure   '[x]'$ Palpitations   '[]'$ Shortness of breath when laying flat   '[]'$ Shortness of breath at rest   '[x]'$ Shortness of breath with exertion. Vascular:  '[x]'$ Pain in legs with walking   '[x]'$ Pain in legs at rest   '[]'$ Pain in legs when laying flat   '[]'$ Claudication   '[x]'$ Pain in feet when walking  '[]'$ Pain in feet at rest  '[]'$ Pain in feet when laying flat   '[x]'$ History of DVT   '[]'$ Phlebitis   '[x]'$ Swelling in legs   '[]'$ Varicose veins   '[]'$ Non-healing ulcers Pulmonary:   '[]'$ Uses home oxygen   '[]'$ Productive cough   '[]'$ Hemoptysis   '[]'$ Wheeze  '[]'$ COPD   '[]'$ Asthma Neurologic:  '[]'$ Dizziness  '[]'$ Blackouts   '[]'$ Seizures   '[]'$ History of stroke   '[]'$ History of TIA  '[]'$ Aphasia   '[]'$ Temporary blindness   '[]'$ Dysphagia   '[]'$ Weakness or numbness in arms   '[x]'$ Weakness or numbness in legs Musculoskeletal:  '[]'$ Arthritis   '[]'$ Joint swelling   '[]'$ Joint pain   '[]'$ Low back pain Hematologic:  '[]'$ Easy bruising  '[]'$ Easy bleeding   '[]'$ Hypercoagulable state   '[]'$ Anemic   Gastrointestinal:  '[]'$ Blood in stool   '[]'$ Vomiting blood  '[]'$ Gastroesophageal  reflux/heartburn   '[]'$ Abdominal pain Genitourinary:  '[]'$ Chronic kidney disease   '[]'$ Difficult urination  '[]'$ Frequent urination  '[]'$ Burning with urination   '[]'$ Hematuria Skin:  '[]'$ Rashes   '[]'$ Ulcers   '[]'$ Wounds Psychological:  '[]'$ History of anxiety   '[]'$  History of major depression.  Physical Examination  BP 119/88   Pulse 85   Resp 17   Wt 81.2 kg (179 lb)   BMI 29.79 kg/m  Gen:  WD/WN, NAD Head: [place]/AT, No temporalis wasting. Ear/Nose/Throat: Hearing grossly intact, nares w/o erythema or drainage, trachea midline Eyes: Conjunctiva clear. Sclera non-icteric Neck: Supple.  No JVD.  Pulmonary:  Good air movement, no use of accessory muscles.  Cardiac: Tachycardic but regular Vascular:  Vessel Right Left  Radial Palpable Palpable                          PT 1+ Palpable 1+ Palpable  DP 1+ Palpable Palpable    Musculoskeletal: M/S 5/5 throughout.  No deformity or atrophy. 1+ bilateral lower extremity edema. Neurologic: Sensation grossly intact in extremities.  Symmetrical.  Speech is fluent.  Psychiatric: Judgment intact, Mood & affect appropriate for pt's clinical situation. Dermatologic: No rashes or ulcers noted.  No cellulitis or open wounds.       Labs Recent Results (from the past 2160 hour(s))  CBC     Status: Abnormal   Collection Time: 02/25/17  4:03 PM  Result Value Ref Range   WBC 9.0 3.8 - 10.6 K/uL   RBC 4.13 (L) 4.40 - 5.90 MIL/uL   Hemoglobin 13.6 13.0 - 18.0 g/dL   HCT 40.3 40.0 - 52.0 %   MCV 97.7 80.0 - 100.0 fL   MCH 33.0 26.0 - 34.0 pg   MCHC 33.8 32.0 -  36.0 g/dL   RDW 14.2 11.5 - 14.5 %   Platelets 232 150 - 440 K/uL  Basic metabolic panel     Status: Abnormal   Collection Time: 02/25/17  4:03 PM  Result Value Ref Range   Sodium 139 135 - 145 mmol/L   Potassium 3.5 3.5 - 5.1 mmol/L   Chloride 108 101 - 111 mmol/L   CO2 26 22 - 32 mmol/L   Glucose, Bld 197 (H) 65 - 99 mg/dL   BUN 12 6 - 20 mg/dL   Creatinine, Ser 1.41 (H) 0.61 - 1.24 mg/dL    Calcium 8.3 (L) 8.9 - 10.3 mg/dL   GFR calc non Af Amer 53 (L) >60 mL/min   GFR calc Af Amer >60 >60 mL/min    Comment: (NOTE) The eGFR has been calculated using the CKD EPI equation. This calculation has not been validated in all clinical situations. eGFR's persistently <60 mL/min signify possible Chronic Kidney Disease.    Anion gap 5 5 - 15  Protime-INR     Status: Abnormal   Collection Time: 02/25/17  4:03 PM  Result Value Ref Range   Prothrombin Time 32.1 (H) 11.4 - 15.2 seconds   INR 3.04   Glucose, capillary     Status: Abnormal   Collection Time: 02/25/17 10:37 PM  Result Value Ref Range   Glucose-Capillary 123 (H) 65 - 99 mg/dL  APTT     Status: Abnormal   Collection Time: 02/25/17 10:41 PM  Result Value Ref Range   aPTT 97 (H) 24 - 36 seconds    Comment:        IF BASELINE aPTT IS ELEVATED, SUGGEST PATIENT RISK ASSESSMENT BE USED TO DETERMINE APPROPRIATE ANTICOAGULANT THERAPY.   Heparin level (unfractionated)     Status: Abnormal   Collection Time: 02/25/17 10:41 PM  Result Value Ref Range   Heparin Unfractionated 0.19 (L) 0.30 - 0.70 IU/mL    Comment:        IF HEPARIN RESULTS ARE BELOW EXPECTED VALUES, AND PATIENT DOSAGE HAS BEEN CONFIRMED, SUGGEST FOLLOW UP TESTING OF ANTITHROMBIN III LEVELS.   Basic metabolic panel     Status: Abnormal   Collection Time: 02/26/17  5:10 AM  Result Value Ref Range   Sodium 145 135 - 145 mmol/L   Potassium 3.6 3.5 - 5.1 mmol/L   Chloride 108 101 - 111 mmol/L   CO2 29 22 - 32 mmol/L   Glucose, Bld 125 (H) 65 - 99 mg/dL   BUN 11 6 - 20 mg/dL   Creatinine, Ser 1.43 (H) 0.61 - 1.24 mg/dL   Calcium 8.6 (L) 8.9 - 10.3 mg/dL   GFR calc non Af Amer 52 (L) >60 mL/min   GFR calc Af Amer >60 >60 mL/min    Comment: (NOTE) The eGFR has been calculated using the CKD EPI equation. This calculation has not been validated in all clinical situations. eGFR's persistently <60 mL/min signify possible Chronic Kidney Disease.    Anion  gap 8 5 - 15  CBC     Status: Abnormal   Collection Time: 02/26/17  5:10 AM  Result Value Ref Range   WBC 7.2 3.8 - 10.6 K/uL   RBC 3.74 (L) 4.40 - 5.90 MIL/uL   Hemoglobin 12.6 (L) 13.0 - 18.0 g/dL   HCT 36.8 (L) 40.0 - 52.0 %   MCV 98.5 80.0 - 100.0 fL   MCH 33.7 26.0 - 34.0 pg   MCHC 34.2 32.0 - 36.0 g/dL  RDW 14.1 11.5 - 14.5 %   Platelets 200 150 - 440 K/uL  Heparin level (unfractionated)     Status: Abnormal   Collection Time: 02/26/17  5:10 AM  Result Value Ref Range   Heparin Unfractionated <0.10 (L) 0.30 - 0.70 IU/mL    Comment:        IF HEPARIN RESULTS ARE BELOW EXPECTED VALUES, AND PATIENT DOSAGE HAS BEEN CONFIRMED, SUGGEST FOLLOW UP TESTING OF ANTITHROMBIN III LEVELS.   Glucose, capillary     Status: None   Collection Time: 02/26/17  7:55 AM  Result Value Ref Range   Glucose-Capillary 92 65 - 99 mg/dL   Comment 1 Notify RN   Glucose, capillary     Status: Abnormal   Collection Time: 02/26/17 11:38 AM  Result Value Ref Range   Glucose-Capillary 143 (H) 65 - 99 mg/dL   Comment 1 Notify RN   Heparin level (unfractionated)     Status: Abnormal   Collection Time: 02/26/17 12:17 PM  Result Value Ref Range   Heparin Unfractionated 2.56 (H) 0.30 - 0.70 IU/mL    Comment: RESULTS CONFIRMED BY MANUAL DILUTION QSD        IF HEPARIN RESULTS ARE BELOW EXPECTED VALUES, AND PATIENT DOSAGE HAS BEEN CONFIRMED, SUGGEST FOLLOW UP TESTING OF ANTITHROMBIN III LEVELS.   Glucose, capillary     Status: Abnormal   Collection Time: 02/26/17  4:40 PM  Result Value Ref Range   Glucose-Capillary 148 (H) 65 - 99 mg/dL  Heparin level (unfractionated)     Status: Abnormal   Collection Time: 02/26/17  8:47 PM  Result Value Ref Range   Heparin Unfractionated 1.80 (H) 0.30 - 0.70 IU/mL    Comment:        IF HEPARIN RESULTS ARE BELOW EXPECTED VALUES, AND PATIENT DOSAGE HAS BEEN CONFIRMED, SUGGEST FOLLOW UP TESTING OF ANTITHROMBIN III LEVELS.   Hemoglobin     Status: Abnormal    Collection Time: 02/26/17  8:47 PM  Result Value Ref Range   Hemoglobin 12.9 (L) 13.0 - 18.0 g/dL  Cardiolipin antibodies, IgG, IgM, IgA     Status: None   Collection Time: 02/26/17  8:47 PM  Result Value Ref Range   Anticardiolipin IgG <9 0 - 14 GPL U/mL    Comment: (NOTE)                          Negative:              <15                          Indeterminate:     15 - 20                          Low-Med Positive: >20 - 80                          High Positive:         >80    Anticardiolipin IgM <9 0 - 12 MPL U/mL    Comment: (NOTE)                          Negative:              <13  Indeterminate:     13 - 20                          Low-Med Positive: >20 - 80                          High Positive:         >80    Anticardiolipin IgA <9 0 - 11 APL U/mL    Comment: (NOTE)                          Negative:              <12                          Indeterminate:     12 - 20                          Low-Med Positive: >20 - 80                          High Positive:         >80 Performed At: [facility] [place], [place] [id number] Lindon Romp MD NW:[id number]   Factor 5 leiden     Status: Abnormal   Collection Time: 02/26/17  8:47 PM  Result Value Ref Range   Recommendations-F5LEID: Comment (A)     Comment: (NOTE) RESULT: 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 Factor V Leiden is more resistant to inactivation by activated protein C.  As a result, factor V persists in the circulation leading to a mild hypercoagulable state.  Factor V Leiden has been reported in patients with deep vein thrombosis, pulmonary embolus, central retinal vein occulsion, cerebral sinus thrombosis, and hepatic vein thrombosis. The relative risk of venous thrombosis is increased approximately 4-8 fold in  individuals who are heterozygous.  About 3- 8% of the general [place] and European population are heterozygous.  The risk of venous thrombosis increases exponentially in patients with more than one risk factor, including:  age, surgery, oral contraceptive use, pregnancy, elevated homocysteine levels, or a Factor II/prothrombin mutation (G20210A).  Additionally, for i ndividuals found to be heterozygous for the Factor V Leiden mutation, presence of a second mutation, Factor V R2, further increases the risk if venous thrombosis. Contact [facility]'s Genetics Customer Service at [phone] for further information on both the Factor II (Prothrombin) DNA Analysis, and Factor V R2 DNA Analysis tests. **Genetic counselors are available for health care providers to**  discuss results at [phone]). Methodology: DNA analysis of the Factor V gene was performed by allele-specific PCR. The diagnostic sensitivity and specificity is >99% for both. Molecular-based testing is highly accurate, but as in any laboratory test, diagnostic errors may occur. All test results must be combined with clinical information for the most accurate interpretation. This test was developed and its performance characteristics determined by [facility]. It has not been cleared or approved by the Food and Drug Administration. References: Voelkerding K (1996).   Clin Lab Med [phone]. Allison Quarry, PhD, [facility] Ruben Reason, PhD, [facility] Annetta Maw, M.S., PhD, [facility] Alfredo Bach, PhD, [facility]  Norva Riffle, PhD, [facility] Earlean Polka PhD, [facility] Performed At: [facility] [address] [place], [place] [id number] Nechama Guard MD NL:[id number]   Prothrombin gene mutation     Status: None   Collection Time: 02/26/17  8:47 PM  Result Value Ref Range   Recommendations-PTGENE: Comment     Comment: (NOTE) NEGATIVE No mutation identified. Comment: A point mutation (G20210A) in the factor II  (prothrombin) gene is the second most common cause of inherited thrombophilia. The incidence of this mutation in the [place] Caucasian population is about 2% and in the [place] American population it is approximately 0.5%. This mutation is rare in the [place] and Native American population. Being heterozygous for a prothrombin mutation increases the risk for developing venous thrombosis about 2 to 3 times above the general population risk. Being homozygous for the prothrombin gene mutation increases the relative risk for venous thrombosis further, although it is not yet known how much further the risk is increased. In women heterozygous for the prothrombin gene mutation, the use of estrogen containing oral contraceptives increases the relative risk of venous thrombosis about 16 times and the risk of developing cerebral thrombosis is also significantly increased. In pregnancy the pr othrombin gene mutation increases risk for venous thrombosis and may increase risk for stillbirth, placental abruption, pre-eclampsia and fetal growth restriction. If the patient possesses two or more congenital or acquired thrombophilic risk factors, the risk for thrombosis may rise to more than the sum of the risk ratios for the individual mutations. This assay detects only the prothrombin G20210A mutation and does not measure genetic abnormalities elsewhere in the genome. Other thrombotic risk factors may be pursued through systematic clinical laboratory analysis. These factors include the R506Q (Leiden) mutation in the Factor V gene, plasma homocysteine levels, as well as testing for deficiencies of antithrombin III, protein C and protein S. Genetic Counselors are available for health care providers to discuss results at [phone]). Methodology: DNA analysis of the Factor II gene was performed by PCR amplification followed by restriction analysis. The di agnostic sensitivity is >99% for both. All  the tests must be combined with clinical information for the most accurate interpretation. Molecular-based testing is highly accurate, but as in any laboratory test, diagnostic errors may occur. This test was developed and its performance characteristics determined by [facility]. It has not been cleared or approved by the Food and Drug Administration. Poort SR, et al. Blood. 1996; 48:1856-3149. Varga EA. Circulation. 2004; 702:O37-C58. Mervin Hack, et Home; 19:700-703. Allison Quarry, PhD, [facility] Ruben Reason, PhD, [facility] Annetta Maw, M.S., PhD, [facility] Alfredo Bach, PhD, [facility] Norva Riffle, PhD, [facility] Earlean Polka, PhD, [facility] Performed At: [facility] [place], [place] [id number] Nechama Guard MD IN:[id number]   Glucose, capillary     Status: Abnormal   Collection Time: 02/26/17  9:18 PM  Result Value Ref Range   Glucose-Capillary 117 (H) 65 - 99 mg/dL  Heparin level (unfractionated)     Status: Abnormal   Collection Time: 02/27/17  3:42 AM  Result Value Ref Range   Heparin Unfractionated 1.08 (H) 0.30 - 0.70 IU/mL    Comment:        IF HEPARIN RESULTS ARE BELOW EXPECTED VALUES, AND PATIENT DOSAGE HAS BEEN CONFIRMED, SUGGEST FOLLOW UP TESTING OF ANTITHROMBIN III LEVELS.   CBC     Status: Abnormal   Collection Time: 02/27/17  3:42 AM  Result Value Ref Range  WBC 6.6 3.8 - 10.6 K/uL   RBC 3.64 (L) 4.40 - 5.90 MIL/uL   Hemoglobin 12.1 (L) 13.0 - 18.0 g/dL   HCT 35.3 (L) 40.0 - 52.0 %   MCV 97.0 80.0 - 100.0 fL   MCH 33.3 26.0 - 34.0 pg   MCHC 34.3 32.0 - 36.0 g/dL   RDW 14.4 11.5 - 14.5 %   Platelets 201 150 - 440 K/uL  Glucose, capillary     Status: Abnormal   Collection Time: 02/27/17  7:38 AM  Result Value Ref Range   Glucose-Capillary 101 (H) 65 - 99 mg/dL  Glucose, capillary     Status: Abnormal   Collection Time: 02/27/17 12:06 PM  Result Value Ref Range   Glucose-Capillary 115 (H) 65 - 99  mg/dL  CBC     Status: Abnormal   Collection Time: 03/03/17  8:24 PM  Result Value Ref Range   WBC 9.1 3.8 - 10.6 K/uL   RBC 4.04 (L) 4.40 - 5.90 MIL/uL   Hemoglobin 13.5 13.0 - 18.0 g/dL   HCT 39.6 (L) 40.0 - 52.0 %   MCV 98.1 80.0 - 100.0 fL   MCH 33.5 26.0 - 34.0 pg   MCHC 34.2 32.0 - 36.0 g/dL   RDW 14.5 11.5 - 14.5 %   Platelets 293 150 - 440 K/uL  Comprehensive metabolic panel     Status: Abnormal   Collection Time: 03/03/17  8:24 PM  Result Value Ref Range   Sodium 141 135 - 145 mmol/L   Potassium 3.6 3.5 - 5.1 mmol/L   Chloride 106 101 - 111 mmol/L   CO2 27 22 - 32 mmol/L   Glucose, Bld 113 (H) 65 - 99 mg/dL   BUN 17 6 - 20 mg/dL   Creatinine, Ser 1.49 (H) 0.61 - 1.24 mg/dL   Calcium 9.3 8.9 - 10.3 mg/dL   Total Protein 7.9 6.5 - 8.1 g/dL   Albumin 3.7 3.5 - 5.0 g/dL   AST 46 (H) 15 - 41 U/L   ALT 72 (H) 17 - 63 U/L   Alkaline Phosphatase 189 (H) 38 - 126 U/L   Total Bilirubin 0.6 0.3 - 1.2 mg/dL   GFR calc non Af Amer 50 (L) >60 mL/min   GFR calc Af Amer 58 (L) >60 mL/min    Comment: (NOTE) The eGFR has been calculated using the CKD EPI equation. This calculation has not been validated in all clinical situations. eGFR's persistently <60 mL/min signify possible Chronic Kidney Disease.    Anion gap 8 5 - 15  Troponin I     Status: None   Collection Time: 03/03/17  8:24 PM  Result Value Ref Range   Troponin I <0.03 <0.03 ng/mL  Protime-INR     Status: None   Collection Time: 03/03/17  8:24 PM  Result Value Ref Range   Prothrombin Time 13.9 11.4 - 15.2 seconds   INR 1.07   APTT     Status: None   Collection Time: 03/03/17  8:24 PM  Result Value Ref Range   aPTT 28 24 - 36 seconds  Hepatitis panel, acute     Status: None   Collection Time: 03/04/17  3:31 AM  Result Value Ref Range   Hepatitis B Surface Ag Negative Negative   HCV Ab 0.1 0.0 - 0.9 s/co ratio    Comment: (NOTE)  Negative:     < 0.8                              Indeterminate: 0.8 - 0.9                                  Positive:     > 0.9 The CDC recommends that a positive HCV antibody result be followed up with a HCV Nucleic Acid Amplification test ([id number]). Performed At: [facility] [place], [place] [id number] Lindon Romp MD DJ:[id number]    Hep A IgM Negative Negative   Hep B C IgM Negative Negative  TSH     Status: Abnormal   Collection Time: 03/04/17  4:14 AM  Result Value Ref Range   TSH 16.131 (H) 0.350 - 4.500 uIU/mL    Comment: Performed by a 3rd Generation assay with a functional sensitivity of <=0.01 uIU/mL.  Hemoglobin A1c     Status: Abnormal   Collection Time: 03/04/17  4:14 AM  Result Value Ref Range   Hgb A1c MFr Bld 5.9 (H) 4.8 - 5.6 %    Comment: (NOTE)         Pre-diabetes: 5.7 - 6.4         Diabetes: >6.4         Glycemic control for adults with diabetes: <7.0    Mean Plasma Glucose 123 mg/dL    Comment: (NOTE) Performed At: [facility] [place], [place] [id number] Lindon Romp MD [id number]   Troponin I     Status: None   Collection Time: 03/04/17  4:14 AM  Result Value Ref Range   Troponin I <0.03 <0.03 ng/mL  Troponin I     Status: None   Collection Time: 03/04/17 10:21 AM  Result Value Ref Range   Troponin I <0.03 <0.03 ng/mL  Troponin I     Status: None   Collection Time: 03/04/17  2:51 PM  Result Value Ref Range   Troponin I <0.03 <0.03 ng/mL  Basic metabolic panel     Status: Abnormal   Collection Time: 03/12/17  3:21 AM  Result Value Ref Range   Sodium 140 135 - 145 mmol/L   Potassium 3.4 (L) 3.5 - 5.1 mmol/L   Chloride 110 101 - 111 mmol/L   CO2 24 22 - 32 mmol/L   Glucose, Bld 91 65 - 99 mg/dL   BUN 15 6 - 20 mg/dL   Creatinine, Ser 1.16 0.61 - 1.24 mg/dL   Calcium 8.7 (L) 8.9 - 10.3 mg/dL   GFR calc non Af Amer >60 >60 mL/min   GFR calc Af Amer >60 >60 mL/min    Comment: (NOTE) The eGFR has been calculated using the CKD EPI  equation. This calculation has not been validated in all clinical situations. eGFR's persistently <60 mL/min signify possible Chronic Kidney Disease.    Anion gap 6 5 - 15  CBC     Status: Abnormal   Collection Time: 03/12/17  3:21 AM  Result Value Ref Range   WBC 6.7 3.8 - 10.6 K/uL   RBC 3.55 (L) 4.40 - 5.90 MIL/uL   Hemoglobin 12.0 (L) 13.0 - 18.0 g/dL   HCT 35.3 (L) 40.0 - 52.0 %   MCV 99.5 80.0 - 100.0 fL   MCH 33.9 26.0 - 34.0 pg   MCHC  34.1 32.0 - 36.0 g/dL   RDW 14.7 (H) 11.5 - 14.5 %   Platelets 308 150 - 440 K/uL  Troponin I     Status: None   Collection Time: 03/12/17  3:21 AM  Result Value Ref Range   Troponin I <0.03 <0.03 ng/mL  Protime-INR (order if Patient is taking Coumadin / Warfarin)     Status: None   Collection Time: 03/12/17  3:21 AM  Result Value Ref Range   Prothrombin Time 13.0 11.4 - 15.2 seconds   INR 0.98   APTT     Status: None   Collection Time: 03/12/17  3:21 AM  Result Value Ref Range   aPTT 33 24 - 36 seconds  Heparin level (unfractionated)     Status: Abnormal   Collection Time: 03/12/17  6:53 AM  Result Value Ref Range   Heparin Unfractionated 2.12 (H) 0.30 - 0.70 IU/mL    Comment:        IF HEPARIN RESULTS ARE BELOW EXPECTED VALUES, AND PATIENT DOSAGE HAS BEEN CONFIRMED, SUGGEST FOLLOW UP TESTING OF ANTITHROMBIN III LEVELS.   Basic metabolic panel     Status: Abnormal   Collection Time: 03/13/17  3:37 AM  Result Value Ref Range   Sodium 140 135 - 145 mmol/L   Potassium 3.9 3.5 - 5.1 mmol/L   Chloride 110 101 - 111 mmol/L   CO2 25 22 - 32 mmol/L   Glucose, Bld 119 (H) 65 - 99 mg/dL   BUN 17 6 - 20 mg/dL   Creatinine, Ser 1.16 0.61 - 1.24 mg/dL   Calcium 8.7 (L) 8.9 - 10.3 mg/dL   GFR calc non Af Amer >60 >60 mL/min   GFR calc Af Amer >60 >60 mL/min    Comment: (NOTE) The eGFR has been calculated using the CKD EPI equation. This calculation has not been validated in all clinical situations. eGFR's persistently <60 mL/min  signify possible Chronic Kidney Disease.    Anion gap 5 5 - 15  CBC     Status: Abnormal   Collection Time: 03/13/17  3:37 AM  Result Value Ref Range   WBC 5.4 3.8 - 10.6 K/uL   RBC 3.28 (L) 4.40 - 5.90 MIL/uL   Hemoglobin 11.3 (L) 13.0 - 18.0 g/dL   HCT 32.9 (L) 40.0 - 52.0 %   MCV 100.3 (H) 80.0 - 100.0 fL   MCH 34.3 (H) 26.0 - 34.0 pg   MCHC 34.2 32.0 - 36.0 g/dL   RDW 15.2 (H) 11.5 - 14.5 %   Platelets 269 150 - 440 K/uL  Low molecular wgt heparin (fractionated)     Status: None   Collection Time: 03/13/17  2:21 PM  Result Value Ref Range   Heparin LMW 2.00 IU/mL    Comment:        THERAPEUTIC RANGE: DVT,PE,ACS on LMWH 1 mg/kg q12 at 4 hrs = 0.5-1.2 units/mL. DVT,PE on LMWH 1.5 mg/kg q24 at 4 hrs = 1-2 units/mL.   Uric acid     Status: None   Collection Time: 03/13/17  4:50 PM  Result Value Ref Range   Uric Acid, Serum 6.4 4.4 - 7.6 mg/dL  APTT     Status: None   Collection Time: 03/13/17  8:59 PM  Result Value Ref Range   aPTT 33 24 - 36 seconds  Heparin level (unfractionated)     Status: Abnormal   Collection Time: 03/13/17  8:59 PM  Result Value Ref Range   Heparin Unfractionated 1.66 (  H) 0.30 - 0.70 IU/mL    Comment:        IF HEPARIN RESULTS ARE BELOW EXPECTED VALUES, AND PATIENT DOSAGE HAS BEEN CONFIRMED, SUGGEST FOLLOW UP TESTING OF ANTITHROMBIN III LEVELS.   Magnesium     Status: None   Collection Time: 03/13/17  8:59 PM  Result Value Ref Range   Magnesium 1.8 1.7 - 2.4 mg/dL  CBC     Status: Abnormal   Collection Time: 03/14/17  4:46 AM  Result Value Ref Range   WBC 9.3 3.8 - 10.6 K/uL   RBC 3.67 (L) 4.40 - 5.90 MIL/uL   Hemoglobin 12.4 (L) 13.0 - 18.0 g/dL   HCT 36.9 (L) 40.0 - 52.0 %   MCV 100.6 (H) 80.0 - 100.0 fL   MCH 33.8 26.0 - 34.0 pg   MCHC 33.6 32.0 - 36.0 g/dL   RDW 14.7 (H) 11.5 - 14.5 %   Platelets 294 150 - 440 K/uL  Heparin level (unfractionated)     Status: Abnormal   Collection Time: 03/14/17  4:46 AM  Result Value Ref  Range   Heparin Unfractionated 2.28 (H) 0.30 - 0.70 IU/mL    Comment: RESULTS CONFIRMED BY MANUAL DILUTION        IF HEPARIN RESULTS ARE BELOW EXPECTED VALUES, AND PATIENT DOSAGE HAS BEEN CONFIRMED, SUGGEST FOLLOW UP TESTING OF ANTITHROMBIN III LEVELS.   APTT     Status: Abnormal   Collection Time: 03/14/17  4:46 AM  Result Value Ref Range   aPTT 103 (H) 24 - 36 seconds    Comment:        IF BASELINE aPTT IS ELEVATED, SUGGEST PATIENT RISK ASSESSMENT BE USED TO DETERMINE APPROPRIATE ANTICOAGULANT THERAPY.   APTT     Status: Abnormal   Collection Time: 03/14/17 10:46 AM  Result Value Ref Range   aPTT 102 (H) 24 - 36 seconds    Comment:        IF BASELINE aPTT IS ELEVATED, SUGGEST PATIENT RISK ASSESSMENT BE USED TO DETERMINE APPROPRIATE ANTICOAGULANT THERAPY.   Heparin level (unfractionated)     Status: Abnormal   Collection Time: 03/14/17 10:46 AM  Result Value Ref Range   Heparin Unfractionated 2.48 (H) 0.30 - 0.70 IU/mL    Comment:        IF HEPARIN RESULTS ARE BELOW EXPECTED VALUES, AND PATIENT DOSAGE HAS BEEN CONFIRMED, SUGGEST FOLLOW UP TESTING OF ANTITHROMBIN III LEVELS.   ECHOCARDIOGRAM COMPLETE     Status: None   Collection Time: 03/14/17  1:40 PM  Result Value Ref Range   Weight 3,072 oz   Height 65 in   BP 151/81 mmHg  Heparin level (unfractionated)     Status: Abnormal   Collection Time: 03/14/17  5:15 PM  Result Value Ref Range   Heparin Unfractionated 1.71 (H) 0.30 - 0.70 IU/mL    Comment:        IF HEPARIN RESULTS ARE BELOW EXPECTED VALUES, AND PATIENT DOSAGE HAS BEEN CONFIRMED, SUGGEST FOLLOW UP TESTING OF ANTITHROMBIN III LEVELS.   APTT     Status: Abnormal   Collection Time: 03/14/17  5:15 PM  Result Value Ref Range   aPTT 74 (H) 24 - 36 seconds    Comment:        IF BASELINE aPTT IS ELEVATED, SUGGEST PATIENT RISK ASSESSMENT BE USED TO DETERMINE APPROPRIATE ANTICOAGULANT THERAPY.   CBC     Status: Abnormal   Collection Time: 03/14/17   5:15 PM  Result Value Ref Range  WBC 14.9 (H) 3.8 - 10.6 K/uL   RBC 3.71 (L) 4.40 - 5.90 MIL/uL   Hemoglobin 12.1 (L) 13.0 - 18.0 g/dL   HCT 36.7 (L) 40.0 - 52.0 %   MCV 98.9 80.0 - 100.0 fL   MCH 32.6 26.0 - 34.0 pg   MCHC 32.9 32.0 - 36.0 g/dL   RDW 15.2 (H) 11.5 - 14.5 %   Platelets 347 150 - 440 K/uL  CBC with Differential/Platelet     Status: Abnormal   Collection Time: 03/15/17  4:07 AM  Result Value Ref Range   WBC 11.0 (H) 3.8 - 10.6 K/uL   RBC 3.44 (L) 4.40 - 5.90 MIL/uL   Hemoglobin 11.6 (L) 13.0 - 18.0 g/dL   HCT 33.9 (L) 40.0 - 52.0 %   MCV 98.5 80.0 - 100.0 fL   MCH 33.6 26.0 - 34.0 pg   MCHC 34.1 32.0 - 36.0 g/dL   RDW 15.0 (H) 11.5 - 14.5 %   Platelets 299 150 - 440 K/uL   Neutrophils Relative % 90 %   Neutro Abs 9.9 (H) 1.4 - 6.5 K/uL   Lymphocytes Relative 7 %   Lymphs Abs 0.7 (L) 1.0 - 3.6 K/uL   Monocytes Relative 3 %   Monocytes Absolute 0.3 0.2 - 1.0 K/uL   Eosinophils Relative 0 %   Eosinophils Absolute 0.0 0 - 0.7 K/uL   Basophils Relative 0 %   Basophils Absolute 0.0 0 - 0.1 K/uL  Basic metabolic panel     Status: Abnormal   Collection Time: 03/15/17  4:07 AM  Result Value Ref Range   Sodium 143 135 - 145 mmol/L   Potassium 4.1 3.5 - 5.1 mmol/L   Chloride 108 101 - 111 mmol/L   CO2 27 22 - 32 mmol/L   Glucose, Bld 137 (H) 65 - 99 mg/dL   BUN 30 (H) 6 - 20 mg/dL   Creatinine, Ser 1.22 0.61 - 1.24 mg/dL   Calcium 9.0 8.9 - 10.3 mg/dL   GFR calc non Af Amer >60 >60 mL/min   GFR calc Af Amer >60 >60 mL/min    Comment: (NOTE) The eGFR has been calculated using the CKD EPI equation. This calculation has not been validated in all clinical situations. eGFR's persistently <60 mL/min signify possible Chronic Kidney Disease.    Anion gap 8 5 - 15  Basic metabolic panel     Status: Abnormal   Collection Time: 03/22/17 12:30 PM  Result Value Ref Range   Sodium 142 135 - 145 mmol/L   Potassium 4.2 3.5 - 5.1 mmol/L   Chloride 107 101 - 111 mmol/L     CO2 25 22 - 32 mmol/L   Glucose, Bld 140 (H) 65 - 99 mg/dL   BUN 15 6 - 20 mg/dL   Creatinine, Ser 1.31 (H) 0.61 - 1.24 mg/dL   Calcium 9.5 8.9 - 10.3 mg/dL   GFR calc non Af Amer 58 (L) >60 mL/min   GFR calc Af Amer >60 >60 mL/min    Comment: (NOTE) The eGFR has been calculated using the CKD EPI equation. This calculation has not been validated in all clinical situations. eGFR's persistently <60 mL/min signify possible Chronic Kidney Disease.    Anion gap 10 5 - 15  CBC     Status: Abnormal   Collection Time: 03/22/17 12:30 PM  Result Value Ref Range   WBC 7.4 3.8 - 10.6 K/uL   RBC 4.12 (L) 4.40 - 5.90 MIL/uL  Hemoglobin 13.8 13.0 - 18.0 g/dL   HCT 25.3 66.4 - 40.3 %   MCV 99.5 80.0 - 100.0 fL   MCH 33.5 26.0 - 34.0 pg   MCHC 33.7 32.0 - 36.0 g/dL   RDW 47.4 (H) 25.9 - 56.3 %   Platelets 303 150 - 440 K/uL  Troponin I     Status: None   Collection Time: 03/22/17 12:30 PM  Result Value Ref Range   Troponin I <0.03 <0.03 ng/mL  CK     Status: Abnormal   Collection Time: 03/22/17 12:30 PM  Result Value Ref Range   Total CK 22 (L) 49 - 397 U/L  CBC     Status: Abnormal   Collection Time: 03/30/17 11:01 AM  Result Value Ref Range   WBC 8.4 3.8 - 10.6 K/uL   RBC 3.75 (L) 4.40 - 5.90 MIL/uL   Hemoglobin 12.6 (L) 13.0 - 18.0 g/dL   HCT 87.5 (L) 64.3 - 32.9 %   MCV 99.0 80.0 - 100.0 fL   MCH 33.6 26.0 - 34.0 pg   MCHC 33.9 32.0 - 36.0 g/dL   RDW 51.8 (H) 84.1 - 66.0 %   Platelets 267 150 - 440 K/uL  Basic metabolic panel     Status: Abnormal   Collection Time: 03/30/17 11:01 AM  Result Value Ref Range   Sodium 142 135 - 145 mmol/L   Potassium 4.9 3.5 - 5.1 mmol/L   Chloride 108 101 - 111 mmol/L   CO2 27 22 - 32 mmol/L   Glucose, Bld 119 (H) 65 - 99 mg/dL   BUN 18 6 - 20 mg/dL   Creatinine, Ser 6.30 0.61 - 1.24 mg/dL   Calcium 9.0 8.9 - 16.0 mg/dL   GFR calc non Af Amer >60 >60 mL/min   GFR calc Af Amer >60 >60 mL/min    Comment: (NOTE) The eGFR has been  calculated using the CKD EPI equation. This calculation has not been validated in all clinical situations. eGFR's persistently <60 mL/min signify possible Chronic Kidney Disease.    Anion gap 7 5 - 15  Protime-INR     Status: None   Collection Time: 03/30/17 11:01 AM  Result Value Ref Range   Prothrombin Time 13.2 11.4 - 15.2 seconds   INR 1.00   APTT     Status: None   Collection Time: 03/30/17 11:01 AM  Result Value Ref Range   aPTT 25 24 - 36 seconds  Troponin I     Status: None   Collection Time: 03/30/17 11:01 AM  Result Value Ref Range   Troponin I <0.03 <0.03 ng/mL  Brain natriuretic peptide     Status: None   Collection Time: 03/30/17 11:01 AM  Result Value Ref Range   B Natriuretic Peptide 51.0 0.0 - 100.0 pg/mL  Troponin I     Status: None   Collection Time: 03/30/17  1:25 PM  Result Value Ref Range   Troponin I <0.03 <0.03 ng/mL    Radiology Ct Angio Chest Pe W And/or Wo Contrast  Result Date: 03/30/2017 CLINICAL DATA:  Chest pain and shortness of breath.  Left leg pain. EXAM: CT ANGIOGRAPHY CHEST WITH CONTRAST TECHNIQUE: Multidetector CT imaging of the chest was performed using the standard protocol during bolus administration of intravenous contrast. Multiplanar CT image reconstructions and MIPs were obtained to evaluate the vascular anatomy. CONTRAST:  75 cc Isovue 370 COMPARISON:  03/22/2017 FINDINGS: Cardiovascular: Mediastinum/Nodes: No enlarged mediastinal, hilar, or axillary lymph nodes. Thyroid  gland, trachea, and esophagus demonstrate no significant findings. Lungs/Pleura: Lungs are clear. No pleural effusion or pneumothorax. Upper Abdomen: No acute abnormality. Musculoskeletal: No chest wall abnormality. No acute abnormalities. Old compression fractures in the midthoracic spine with previous vertebroplasty at T6. Review of the MIP images confirms the above findings. IMPRESSION: No acute abnormalities.  No evidence of acute pulmonary embolism. Electronically  Signed   By: Lorriane Shire M.D.   On: 03/30/2017 12:31   US Venous Img Lower Unilateral Left  Result Date: 03/30/2017 CLINICAL DATA:  Left leg pain and swelling. EXAM: LEFT LOWER EXTREMITY VENOUS DOPPLER ULTRASOUND TECHNIQUE: Gray-scale sonography with graded compression, as well as color Doppler and duplex ultrasound were performed to evaluate the lower extremity deep venous systems from the level of the common femoral vein and including the common femoral, femoral, profunda femoral, popliteal and calf veins including the posterior tibial, peroneal and gastrocnemius veins when visible. The superficial great saphenous vein was also interrogated. Spectral Doppler was utilized to evaluate flow at rest and with distal augmentation maneuvers in the common femoral, femoral and popliteal veins. COMPARISON:  None. FINDINGS: Contralateral Common Femoral Vein: Respiratory phasicity is normal and symmetric with the symptomatic side. No evidence of thrombus. Normal compressibility. Common Femoral Vein: No evidence of thrombus. Normal compressibility, respiratory phasicity and response to augmentation. Saphenofemoral Junction: No evidence of thrombus. Normal compressibility and flow on color Doppler imaging. Profunda Femoral Vein: No evidence of thrombus. Normal compressibility and flow on color Doppler imaging. Femoral Vein: No evidence of thrombus. Normal compressibility, respiratory phasicity and response to augmentation. Popliteal Vein: No evidence of thrombus. Normal compressibility, respiratory phasicity and response to augmentation. Calf Veins: No evidence of thrombus. Normal compressibility and flow on color Doppler imaging. Superficial Great Saphenous Vein: No evidence of thrombus. Normal compressibility and flow on color Doppler imaging. Venous Reflux:  None. Other Findings:  None. IMPRESSION: No evidence of DVT within the left lower extremity. Electronically Signed   By: Kathreen Devoid   On: 03/30/2017 10:25      Assessment/Plan  CAD (coronary artery disease) With some marked tachycardia today. He should see his cardiologist for further evaluation.  Left leg pain His ABIs are completely normal at 1.03 on the right and 1.09 on the left with brisk waveforms consistent with no arterial insufficiency. He was recently in the hospital and has a long history of blood clots, but no acute blood clot at that time. 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 He has tried Neurontin previously. They asked about Lyrica today and I'm giving him a one-time prescription for this. I have recommended he follow up with his primary care physician to discuss his leg pain as I do not think it is vascular in origin although some degree of postphlebitic symptoms could be present. The posterior buttock symptoms would only be treated with compression, elevation, and increasing his activity. I will see him back as needed.  Diabetes mellitus ([place]) blood glucose control important in reducing the progression of atherosclerotic disease. Also, involved in wound healing. On appropriate medications.     Leotis Pain, MD  04/22/2017 11:57 AM    This note was created with Dragon medical transcription system.  Any errors from dictation are purely unintentional

## 2017-04-22 NOTE — Assessment & Plan Note (Signed)
With some marked tachycardia today. He should see his cardiologist for further evaluation.

## 2017-04-22 NOTE — Assessment & Plan Note (Signed)
blood glucose control important in reducing the progression of atherosclerotic disease. Also, involved in wound healing. On appropriate medications.  

## 2017-04-24 ENCOUNTER — Encounter: Payer: Self-pay | Admitting: Emergency Medicine

## 2017-04-24 ENCOUNTER — Emergency Department: Payer: Medicare Other

## 2017-04-24 ENCOUNTER — Emergency Department
Admission: EM | Admit: 2017-04-24 | Discharge: 2017-04-25 | Disposition: A | Discharge status: Home / Self Care | Payer: Medicare Other | Attending: Emergency Medicine | Admitting: Emergency Medicine

## 2017-04-24 DIAGNOSIS — M79604 Pain in right leg: Secondary | ICD-10-CM | POA: Diagnosis not present

## 2017-04-24 DIAGNOSIS — E119 Type 2 diabetes mellitus without complications: Secondary | ICD-10-CM | POA: Diagnosis not present

## 2017-04-24 DIAGNOSIS — J45909 Unspecified asthma, uncomplicated: Secondary | ICD-10-CM | POA: Insufficient documentation

## 2017-04-24 DIAGNOSIS — W1830XA Fall on same level, unspecified, initial encounter: Secondary | ICD-10-CM | POA: Diagnosis not present

## 2017-04-24 DIAGNOSIS — Y999 Unspecified external cause status: Secondary | ICD-10-CM | POA: Insufficient documentation

## 2017-04-24 DIAGNOSIS — S80211A Abrasion, right knee, initial encounter: Secondary | ICD-10-CM | POA: Diagnosis not present

## 2017-04-24 DIAGNOSIS — M25551 Pain in right hip: Secondary | ICD-10-CM | POA: Diagnosis not present

## 2017-04-24 DIAGNOSIS — M79606 Pain in leg, unspecified: Secondary | ICD-10-CM | POA: Diagnosis present

## 2017-04-24 DIAGNOSIS — Y9389 Activity, other specified: Secondary | ICD-10-CM | POA: Diagnosis not present

## 2017-04-24 DIAGNOSIS — Z87891 Personal history of nicotine dependence: Secondary | ICD-10-CM | POA: Insufficient documentation

## 2017-04-24 DIAGNOSIS — Y92002 Bathroom of unspecified non-institutional (private) residence single-family (private) house as the place of occurrence of the external cause: Secondary | ICD-10-CM | POA: Diagnosis not present

## 2017-04-24 DIAGNOSIS — I2510 Atherosclerotic heart disease of native coronary artery without angina pectoris: Secondary | ICD-10-CM | POA: Insufficient documentation

## 2017-04-24 DIAGNOSIS — S8991XA Unspecified injury of right lower leg, initial encounter: Secondary | ICD-10-CM | POA: Diagnosis not present

## 2017-04-24 DIAGNOSIS — J449 Chronic obstructive pulmonary disease, unspecified: Secondary | ICD-10-CM | POA: Diagnosis not present

## 2017-04-24 DIAGNOSIS — W19XXXA Unspecified fall, initial encounter: Secondary | ICD-10-CM | POA: Diagnosis not present

## 2017-04-24 DIAGNOSIS — M79605 Pain in left leg: Secondary | ICD-10-CM | POA: Insufficient documentation

## 2017-04-24 DIAGNOSIS — S80212A Abrasion, left knee, initial encounter: Secondary | ICD-10-CM | POA: Diagnosis not present

## 2017-04-24 DIAGNOSIS — M25552 Pain in left hip: Secondary | ICD-10-CM | POA: Insufficient documentation

## 2017-04-24 DIAGNOSIS — Z79899 Other long term (current) drug therapy: Secondary | ICD-10-CM | POA: Diagnosis not present

## 2017-04-24 DIAGNOSIS — S79912A Unspecified injury of left hip, initial encounter: Secondary | ICD-10-CM | POA: Diagnosis not present

## 2017-04-24 DIAGNOSIS — I10 Essential (primary) hypertension: Secondary | ICD-10-CM | POA: Insufficient documentation

## 2017-04-24 DIAGNOSIS — M25562 Pain in left knee: Secondary | ICD-10-CM | POA: Diagnosis not present

## 2017-04-24 DIAGNOSIS — M25561 Pain in right knee: Secondary | ICD-10-CM | POA: Diagnosis not present

## 2017-04-24 DIAGNOSIS — S79911A Unspecified injury of right hip, initial encounter: Secondary | ICD-10-CM | POA: Diagnosis not present

## 2017-04-24 DIAGNOSIS — Z7901 Long term (current) use of anticoagulants: Secondary | ICD-10-CM | POA: Insufficient documentation

## 2017-04-24 DIAGNOSIS — S8992XA Unspecified injury of left lower leg, initial encounter: Secondary | ICD-10-CM | POA: Diagnosis not present

## 2017-04-24 NOTE — ED Triage Notes (Signed)
Pt arrived via ems from home after a mechanical fall that resulted in upper bilateral leg pain. Pt was able to independently move from ems stretcher to er stretcher. Pt alert and oriented upon arrival and denies any loc. Pt states he tripped over commode but denies hitting head.

## 2017-04-24 NOTE — ED Provider Notes (Signed)
[facility] Emergency Department Provider Note  Time seen: 11:15 PM  I have reviewed the triage vital signs and the nursing notes.   HISTORY  Chief Complaint Fall and Leg Pain    HPI Brian Hampton is a 61 y.o. male with a past medical history of asthma, anemia, depression, HIV,DVT on Lovenox, hypertension, MI, diabetes, presents to the emergency department for leg pain. According to the patient he was using the bathroom tonight when he was picking his pants off he lost his balance falling onto the floor. Denies hitting his head. Denies LOC. Patient states he had a crawl back to the telephone to call EMS. Upon arrival here the patient is complaining of pain in bilateral hips and bilateral knees. Patient was able to stand and transfer himself from the EMS stretcher to the patient bed per nurse.  patient denies chest pain or abdominal pain. Patient does take chronic pain medication, states he was recently transitioned to Lyrica.  Past Medical History:  Diagnosis Date  . Anemia   . Asthma   . Cataracts, bilateral    worse in Rt eye  . Collagen vascular disease (HCC)   . COPD (chronic obstructive pulmonary disease) (HCC)   . Coronary artery disease   . Depression   . DVT (deep venous thrombosis) (HCC)   . Dysrhythmia   . Emphysema/COPD (HCC)   . Environmental and seasonal allergies   . H/O blood clots   . Heart murmur   . HIV (human immunodeficiency virus infection) (HCC)   . Hypertension   . Leaky heart valve    x 3  . Lung mass   . Myocardial infarction (HCC)    in 2000  . Pneumonia    year ago  . Pulmonary emboli (HCC)   . Type 2 diabetes mellitus [facility])     Patient Active Problem List   Diagnosis Date Noted  . Acute deep vein thrombosis (DVT) of femoral vein of right lower extremity (HCC) 04/04/2017  . Left leg pain 03/22/2017  . DVT (deep venous thrombosis) (HCC) 03/12/2017  . Acute deep vein thrombosis (DVT) of right lower extremity (HCC)  02/25/2017  . Postoperative pain   . COPD exacerbation (HCC) 12/23/2016  . Abdominal pain   . Compression fracture of body of thoracic vertebra (HCC) 12/21/2016  . Hypomagnesemia 05/05/2016  . Rhabdomyolysis 05/05/2016  . Acute pulmonary embolism (HCC) 03/17/2016  . Right leg DVT (HCC) 03/17/2016  . Hypokalemia 03/17/2016  . S/P IVC filter 03/17/2016  . SOB (shortness of breath) 03/15/2016  . Dysphagia 02/02/2016  . GERD (gastroesophageal reflux disease) 02/02/2016  . Hyperthyroidism 01/31/2016  . 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 Elevated transaminase level 01/31/2016  . Anemia 01/31/2016  . Thrombocytopenia (HCC) 01/31/2016  . Pyuria 01/31/2016  . Weakness 01/29/2016  . HIV (human immunodeficiency virus infection) (HCC) 01/29/2016  . CAD (coronary artery disease) 01/29/2016  . COPD (chronic obstructive pulmonary disease) (HCC) 01/29/2016  . Diabetes mellitus (HCC) 01/29/2016  . Leg weakness, bilateral 01/13/2016  . Chest pain 06/24/2015    Past Surgical History:  Procedure Laterality Date  . ANKLE ARTHROSCOPY    . IVC FILTER INSERTION    . KYPHOPLASTY N/A 12/21/2016   Procedure: KYPHOPLASTY;  Surgeon: Kennedy Bucker, MD;  Location: [facility] ORS;  Service: Orthopedics;  Laterality: N/A;  . PERIPHERAL VASCULAR CATHETERIZATION N/A 03/16/2016   Procedure: IVC Filter Insertion;  Surgeon: Renford Dills, MD;  Location: [facility] INVASIVE CV LAB;  Service: Cardiovascular;  Laterality: N/A;  .  SINUS EXPLORATION    . WRIST ARTHROSCOPY Right     Prior to Admission medications   Medication Sig Start Date End Date Taking? Authorizing Provider  abacavir-dolutegravir-lamiVUDine (TRIUMEQ) 600-50-300 MG tablet Take 1 tablet by mouth daily after breakfast. Reported on 01/30/2016 01/27/16   [provider]  acetaminophen (TYLENOL) 325 MG tablet Take 1 tablet (325 mg total) by mouth every 6 (six) hours as needed for mild pain (or Fever >/= 101). 03/15/17   Gouru, Aruna, MD  albuterol  (PROVENTIL HFA;VENTOLIN HFA) 108 (90 BASE) MCG/ACT inhaler Inhale 2 puffs into the lungs every 6 (six) hours as needed for wheezing or shortness of breath.    [provider]  albuterol (PROVENTIL) (2.5 MG/3ML) 0.083% nebulizer solution Take 2.5 mg by nebulization 3 (three) times daily.     [provider]  atorvastatin (LIPITOR) 10 MG tablet Take 10 mg by mouth daily after breakfast. 11/16/16   [provider]  brimonidine (ALPHAGAN) 0.2 % ophthalmic solution  01/31/17   [provider]  Calcium Carbonate-Vitamin D (CALCIUM 600+D) 600-400 MG-UNIT tablet Take 1 tablet by mouth daily after breakfast.     [provider]  carvedilol (COREG) 12.5 MG tablet Take 12.5 mg by mouth 2 (two) times daily with a meal.    [provider]  cetirizine (ZYRTEC) 10 MG tablet Take 10 mg by mouth daily as needed for allergies.     [provider]  cyclobenzaprine (FLEXERIL) 5 MG tablet Take 1 tablet (5 mg total) by mouth 3 (three) times daily as needed for muscle spasms. 04/04/17   Earna Coder, MD  docusate sodium (COLACE) 50 MG capsule Take by mouth.    [provider]  enoxaparin (LOVENOX) 80 MG/0.8ML injection  04/13/17   [provider]  EPINEPHrine (EPIPEN 2-PAK) 0.3 mg/0.3 mL IJ SOAJ injection Inject 0.3 mg into the muscle once as needed (for severe allergic reaction).    [provider]  escitalopram (LEXAPRO) 20 MG tablet Take 20 mg by mouth daily after breakfast.     [provider]  fondaparinux (ARIXTRA) 7.5 MG/0.6ML SOLN injection Inject 0.6 mLs (7.5 mg total) into the skin daily. 04/04/17   Earna Coder, MD  furosemide (LASIX) 20 MG tablet Take 20 mg by mouth daily.  10/22/16   [provider]  guaiFENesin (MUCINEX) 600 MG 12 hr tablet Take 1 tablet (600 mg total) by mouth 2 (two) times daily. 03/15/17   Ramonita Lab, MD  HYDROcodone-acetaminophen (NORCO) 5-325 MG tablet Take 1 tablet by  mouth every 6 (six) hours as needed for moderate pain. 04/04/17   Earna Coder, MD  ketorolac (ACULAR) 0.5 % ophthalmic solution 1 drop 4 (four) times daily.    [provider]  levothyroxine (SYNTHROID, LEVOTHROID) 50 MCG tablet Take 1 tablet (50 mcg total) by mouth daily before breakfast. 03/05/17 04/04/17  Auburn Bilberry, MD  LINZESS 72 MCG capsule Take 72 mcg by mouth daily after breakfast. 11/10/16   [provider]  losartan (COZAAR) 25 MG tablet Take 25 mg by mouth every morning. 03/03/17   [provider]  magnesium oxide (MAG-OX) 400 MG tablet Take 400 mg by mouth daily after breakfast.    [provider]  meloxicam (MOBIC) 7.5 MG tablet Take 7.5 mg by mouth daily as needed for pain.    [provider]  mirtazapine (REMERON) 15 MG tablet Take 15 mg by mouth daily.     [provider]  mometasone-formoterol [facility])  200-5 MCG/ACT AERO Inhale 2 puffs into the lungs 3 (three) times daily.     [provider]  Multiple Vitamin (MULTIVITAMIN WITH MINERALS) TABS tablet Take 1 tablet by mouth daily. ONE-A-DAY MULTIVITAMIN 50+    [provider]  nitroGLYCERIN (NITROSTAT) 0.4 MG SL tablet Place 0.4 mg under the tongue every 5 (five) minutes as needed for chest pain.    [provider]  ofloxacin (OCUFLOX) 0.3 % ophthalmic solution  01/31/17   [provider]  omeprazole (PRILOSEC) 40 MG capsule Take 40 mg by mouth daily after breakfast. 11/16/16   [provider]  Potassium Chloride CR (MICRO-K) 8 MEQ CPCR capsule CR Take 1 capsule by mouth daily after breakfast. 11/15/16   [provider]  sennosides-docusate sodium (SENOKOT-S) 8.6-50 MG tablet Take 1 tablet by mouth at bedtime as needed for constipation.    [provider]  tiotropium (SPIRIVA) 18 MCG inhalation capsule Place 18 mcg into inhaler and inhale daily.    [provider]  traMADol (ULTRAM) 50 MG tablet Take by  mouth every 6 (six) hours as needed. Pt taking twice a day    [provider]  zolpidem (AMBIEN) 5 MG tablet Take 5 mg by mouth at bedtime. 10/15/16   [provider]    Allergies  Allergen Reactions  . Aspirin Anaphylaxis  . Bee Venom Anaphylaxis  . Penicillins Anaphylaxis and Other (See Comments)    Has patient had a PCN reaction causing immediate rash, facial/tongue/throat swelling, SOB or lightheadedness with hypotension: Yes Has patient had a PCN reaction causing severe rash involving mucus membranes or skin necrosis: No Has patient had a PCN reaction that required hospitalization No Has patient had a PCN reaction occurring within the last 10 years: Yes If all of the above answers are "NO", then may proceed with Cephalosporin use.  . Prednisone Anaphylaxis  . Sulfa Antibiotics Anaphylaxis  . Sulfasalazine Anaphylaxis  . Theophyllines Anaphylaxis  . Theophylline Swelling    Family History  Problem Relation Age of Onset  . CAD Unknown   . Asthma Mother   . Cirrhosis Father   . Deep vein thrombosis Neg Hx   . Diabetes Neg Hx     Social History Social History  Substance Use Topics  . Smoking status: Former Smoker    Quit date: 12/16/2001  . Smokeless tobacco: Never Used  . Alcohol use No    Review of Systems Constitutional: Negative for fever. Cardiovascular: Negative for chest pain. Respiratory: Negative for shortness of breath. Gastrointestinal: Negative for abdominal pain Musculoskeletal: bilateral hip and knee pain. Abrasions bilateral knees. Neurological: Negative for headache All other ROS negative  ____________________________________________   PHYSICAL EXAM:  VITAL SIGNS: ED Triage Vitals  Enc Vitals Group     BP 04/24/17 2235 (!) 151/99     Pulse Rate 04/24/17 2235 93     Resp 04/24/17 2235 18     Temp 04/24/17 2235 98.1 F (36.7 C)     Temp Source 04/24/17 2235 Oral     SpO2 04/24/17 2235 96 %     Weight 04/24/17 2232 179 lb  (81.2 kg)     Height 04/24/17 2232  (1.651 m)     Head Circumference --      Peak Flow --      Pain Score 04/24/17 2231 10     Pain Loc --      Pain Edu? --      Excl. in GC? --  Constitutional: Alert and oriented. lying in bed, in no distress. Eyes: Normal exam ENT   Head: Normocephalic and atraumatic.   Mouth/Throat: Mucous membranes are moist. Cardiovascular: Normal rate, regular rhythm. No murmur Respiratory: Normal respiratory effort without tachypnea nor retractions. Breath sounds are clear  Gastrointestinal: Soft and nontender. mild distention, patient states this is normal. Musculoskeletal: moderate tenderness of left hip, mild tenderness of right hip, moderate pain with range of motion of left lower extremity, mild pain to range of motion her right lower extremity. Moderate abrasion to left knee again with moderate tenderness and moderate pain with range of motion. Small abrasion to right knee with minimal tenderness and mild pain with range of motion. Both are neurovascular intact distally. Neurologic:  Normal speech and language. No gross focal neurologic deficits Skin:  Skin is warm, dry and intact.  Psychiatric: Mood and affect are normal.   ____________________________________________   RADIOLOGY  x-rays are negative for fracture.  ____________________________________________   INITIAL IMPRESSION / ASSESSMENT AND PLAN / ED COURSE  Pertinent labs & imaging results that were available during my care of the patient were reviewed by me and considered in my medical decision making (see chart for details).  patient presents the emergency department after mechanical fall with bilateral hip and knee pain. We'll obtain x-ray imaging of the hips and knees. Overall the patient appears well, no distress. In reviewing the patient's records he has a history of left leg pain with recent admission for the same of unknown etiology. Patient currently being followed by  his doctor as well as vascular surgery for left leg pain without a known cause. I have reviewed the patient's substance reporting system he was prescribed 30 tablets of hydrocodone 2 days ago.  x-rays are negative. Patient given 1 tablet of Percocet in the emergency department. We'll discharge. I discussed the patient that I will not be discharging with any pain medication as he is prescribed pain medication at home. He is agreeable to this plan.  ____________________________________________   FINAL CLINICAL IMPRESSION(S) / ED DIAGNOSES  fall leg pain   Minna Antis, MD 04/25/17 [phone]

## 2017-04-25 DIAGNOSIS — S80212A Abrasion, left knee, initial encounter: Secondary | ICD-10-CM | POA: Diagnosis not present

## 2017-04-25 MED ORDER — OXYCODONE-ACETAMINOPHEN 5-325 MG PO TABS
1.0000 | ORAL_TABLET | Freq: Once | ORAL | Status: AC
  Administered 2017-04-25: 1 via ORAL
  Filled 2017-04-25: qty 1

## 2017-04-26 ENCOUNTER — Emergency Department
Admission: EM | Admit: 2017-04-26 | Discharge: 2017-04-27 | Disposition: A | Discharge status: Home / Self Care | Payer: Medicare Other | Attending: Emergency Medicine | Admitting: Emergency Medicine

## 2017-04-26 ENCOUNTER — Emergency Department: Payer: Medicare Other

## 2017-04-26 DIAGNOSIS — I129 Hypertensive chronic kidney disease with stage 1 through stage 4 chronic kidney disease, or unspecified chronic kidney disease: Secondary | ICD-10-CM | POA: Diagnosis not present

## 2017-04-26 DIAGNOSIS — B20 Human immunodeficiency virus [HIV] disease: Secondary | ICD-10-CM | POA: Insufficient documentation

## 2017-04-26 DIAGNOSIS — J449 Chronic obstructive pulmonary disease, unspecified: Secondary | ICD-10-CM | POA: Insufficient documentation

## 2017-04-26 DIAGNOSIS — E1122 Type 2 diabetes mellitus with diabetic chronic kidney disease: Secondary | ICD-10-CM | POA: Diagnosis not present

## 2017-04-26 DIAGNOSIS — J45909 Unspecified asthma, uncomplicated: Secondary | ICD-10-CM | POA: Diagnosis not present

## 2017-04-26 DIAGNOSIS — M6281 Muscle weakness (generalized): Secondary | ICD-10-CM | POA: Insufficient documentation

## 2017-04-26 DIAGNOSIS — Y92009 Unspecified place in unspecified non-institutional (private) residence as the place of occurrence of the external cause: Secondary | ICD-10-CM | POA: Insufficient documentation

## 2017-04-26 DIAGNOSIS — W19XXXA Unspecified fall, initial encounter: Secondary | ICD-10-CM | POA: Diagnosis not present

## 2017-04-26 DIAGNOSIS — Y9389 Activity, other specified: Secondary | ICD-10-CM | POA: Diagnosis not present

## 2017-04-26 DIAGNOSIS — Z87891 Personal history of nicotine dependence: Secondary | ICD-10-CM | POA: Diagnosis not present

## 2017-04-26 DIAGNOSIS — S199XXA Unspecified injury of neck, initial encounter: Secondary | ICD-10-CM | POA: Diagnosis not present

## 2017-04-26 DIAGNOSIS — R410 Disorientation, unspecified: Secondary | ICD-10-CM

## 2017-04-26 DIAGNOSIS — I2510 Atherosclerotic heart disease of native coronary artery without angina pectoris: Secondary | ICD-10-CM | POA: Diagnosis not present

## 2017-04-26 DIAGNOSIS — W01198A Fall on same level from slipping, tripping and stumbling with subsequent striking against other object, initial encounter: Secondary | ICD-10-CM | POA: Diagnosis not present

## 2017-04-26 DIAGNOSIS — R079 Chest pain, unspecified: Secondary | ICD-10-CM | POA: Diagnosis not present

## 2017-04-26 DIAGNOSIS — M25461 Effusion, right knee: Secondary | ICD-10-CM | POA: Diagnosis not present

## 2017-04-26 DIAGNOSIS — E119 Type 2 diabetes mellitus without complications: Secondary | ICD-10-CM | POA: Diagnosis not present

## 2017-04-26 DIAGNOSIS — I82401 Acute embolism and thrombosis of unspecified deep veins of right lower extremity: Secondary | ICD-10-CM | POA: Diagnosis not present

## 2017-04-26 DIAGNOSIS — Z79899 Other long term (current) drug therapy: Secondary | ICD-10-CM | POA: Diagnosis not present

## 2017-04-26 DIAGNOSIS — I10 Essential (primary) hypertension: Secondary | ICD-10-CM | POA: Diagnosis not present

## 2017-04-26 DIAGNOSIS — N189 Chronic kidney disease, unspecified: Secondary | ICD-10-CM | POA: Diagnosis not present

## 2017-04-26 DIAGNOSIS — R531 Weakness: Secondary | ICD-10-CM | POA: Diagnosis not present

## 2017-04-26 DIAGNOSIS — Y999 Unspecified external cause status: Secondary | ICD-10-CM | POA: Insufficient documentation

## 2017-04-26 DIAGNOSIS — J439 Emphysema, unspecified: Secondary | ICD-10-CM | POA: Diagnosis not present

## 2017-04-26 DIAGNOSIS — Z8673 Personal history of transient ischemic attack (TIA), and cerebral infarction without residual deficits: Secondary | ICD-10-CM | POA: Diagnosis not present

## 2017-04-26 DIAGNOSIS — M25462 Effusion, left knee: Secondary | ICD-10-CM | POA: Diagnosis not present

## 2017-04-26 DIAGNOSIS — S0990XA Unspecified injury of head, initial encounter: Secondary | ICD-10-CM | POA: Diagnosis not present

## 2017-04-26 DIAGNOSIS — Z7901 Long term (current) use of anticoagulants: Secondary | ICD-10-CM | POA: Insufficient documentation

## 2017-04-26 LAB — CBC WITH DIFFERENTIAL/PLATELET
BASOS ABS: 0.1 10*3/uL (ref 0–0.1)
BASOS PCT: 1 %
EOS ABS: 0.2 10*3/uL (ref 0–0.7)
Eosinophils Relative: 3 %
HCT: 36.8 % — ABNORMAL LOW (ref 40.0–52.0)
Hemoglobin: 12.5 g/dL — ABNORMAL LOW (ref 13.0–18.0)
Lymphocytes Relative: 23 %
Lymphs Abs: 1.5 10*3/uL (ref 1.0–3.6)
MCH: 33.7 pg (ref 26.0–34.0)
MCHC: 33.9 g/dL (ref 32.0–36.0)
MCV: 99.3 fL (ref 80.0–100.0)
Monocytes Absolute: 0.5 10*3/uL (ref 0.2–1.0)
Monocytes Relative: 8 %
NEUTROS ABS: 4.2 10*3/uL (ref 1.4–6.5)
NEUTROS PCT: 65 %
PLATELETS: 327 10*3/uL (ref 150–440)
RBC: 3.71 MIL/uL — ABNORMAL LOW (ref 4.40–5.90)
RDW: 15.6 % — AB (ref 11.5–14.5)
WBC: 6.5 10*3/uL (ref 3.8–10.6)

## 2017-04-26 LAB — URINALYSIS, COMPLETE (UACMP) WITH MICROSCOPIC
BACTERIA UA: NONE SEEN
Bilirubin Urine: NEGATIVE
Glucose, UA: NEGATIVE mg/dL
HGB URINE DIPSTICK: NEGATIVE
KETONES UR: NEGATIVE mg/dL
Leukocytes, UA: NEGATIVE
Nitrite: NEGATIVE
PROTEIN: NEGATIVE mg/dL
RBC / HPF: NONE SEEN RBC/hpf (ref 0–5)
SQUAMOUS EPITHELIAL / LPF: NONE SEEN
Specific Gravity, Urine: 1.012 (ref 1.005–1.030)
pH: 5 (ref 5.0–8.0)

## 2017-04-26 LAB — BASIC METABOLIC PANEL
Anion gap: 11 (ref 5–15)
BUN: 19 mg/dL (ref 6–20)
CO2: 27 mmol/L (ref 22–32)
Calcium: 8.9 mg/dL (ref 8.9–10.3)
Chloride: 101 mmol/L (ref 101–111)
Creatinine, Ser: 1.43 mg/dL — ABNORMAL HIGH (ref 0.61–1.24)
GFR calc Af Amer: >60 mL/min (ref >60)
GFR calc non Af Amer: 52 mL/min — ABNORMAL LOW (ref >60)
Glucose, Bld: 97 mg/dL (ref 65–99)
Potassium: 3.3 mmol/L — ABNORMAL LOW (ref 3.5–5.1)
Sodium: 139 mmol/L (ref 135–145)

## 2017-04-26 LAB — PROTIME-INR
INR: 1.01
PROTHROMBIN TIME: 13.2 s (ref 11.4–15.2)

## 2017-04-26 LAB — APTT: APTT: 29 s (ref 24–36)

## 2017-04-26 LAB — TROPONIN I: Troponin I: <0.03 ng/mL (ref <0.03)

## 2017-04-26 MED ORDER — SODIUM CHLORIDE 0.9 % IV BOLUS (SEPSIS)
1000.0000 mL | Freq: Once | INTRAVENOUS | Status: AC
  Administered 2017-04-26: 1000 mL via INTRAVENOUS

## 2017-04-26 MED ORDER — ACETAMINOPHEN 500 MG PO TABS
1000.0000 mg | ORAL_TABLET | Freq: Once | ORAL | Status: AC
  Administered 2017-04-26: 1000 mg via ORAL
  Filled 2017-04-26: qty 2

## 2017-04-26 NOTE — ED Provider Notes (Signed)
[facility] Emergency Department Provider Note  ____________________________________________  Time seen: Approximately 9:08 PM  I have reviewed the triage vital signs and the nursing notes.   HISTORY  Chief Complaint Fall   HPI Brian Hampton is a 61 y.o. male with a history of HIV, PE/DVT on Lovenox shots, hypertension, diabetes, CAD, COPD, anemia presents for evaluation of a fall. According to patient's sister who is at the bedside patient has had several falls in the last week and has been more confused and more weak than normal. She has noted that patient has had slurred speech for one week now. Today he opened the door for her to come in and all the sudden both of his legs gave out and patient fell on the ground. He hit his head on the wall but did not pass out. Patient told his sister that he was feeling very weak. Patient tells me that he has been feeling weak for a week. He is complaining of pain in his bilateral knees. He reports that he hit his chest on the ground as well and is complaining of pain in his chest. His pain is constant, mild but worse with palpation of the chest or movement of the knees, nonradiating. No fevers, no vomiting, no diarrhea, no abdominal pain, no shortness of breath, no cough or congestion, no dysuria or hematuria. Patient is compliant with his medications.  Past Medical History:  Diagnosis Date  . Anemia   . Asthma   . Cataracts, bilateral    worse in Rt eye  . Collagen vascular disease (HCC)   . COPD (chronic obstructive pulmonary disease) (HCC)   . Coronary artery disease   . Depression   . DVT (deep venous thrombosis) (HCC)   . Dysrhythmia   . Emphysema/COPD (HCC)   . Environmental and seasonal allergies   . H/O blood clots   . Heart murmur   . HIV (human immunodeficiency virus infection) (HCC)   . Hypertension   . Leaky heart valve    x 3  . Lung mass   . Myocardial infarction (HCC)    in 2000  . Pneumonia    year ago  . Pulmonary emboli (HCC)   . Type 2 diabetes mellitus [facility])     Patient Active Problem List   Diagnosis Date Noted  . Acute deep vein thrombosis (DVT) of femoral vein of right lower extremity (HCC) 04/04/2017  . Left leg pain 03/22/2017  . DVT (deep venous thrombosis) (HCC) 03/12/2017  . Acute deep vein thrombosis (DVT) of right lower extremity (HCC) 02/25/2017  . Postoperative pain   . COPD exacerbation (HCC) 12/23/2016  . Abdominal pain   . Compression fracture of body of thoracic vertebra (HCC) 12/21/2016  . Hypomagnesemia 05/05/2016  . Rhabdomyolysis 05/05/2016  . Acute pulmonary embolism (HCC) 03/17/2016  . Right leg DVT (HCC) 03/17/2016  . Hypokalemia 03/17/2016  . S/P IVC filter 03/17/2016  . SOB (shortness of breath) 03/15/2016  . Dysphagia 02/02/2016  . GERD (gastroesophageal reflux disease) 02/02/2016  . Hyperthyroidism 01/31/2016  . 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 Elevated transaminase level 01/31/2016  . Anemia 01/31/2016  . Thrombocytopenia (HCC) 01/31/2016  . Pyuria 01/31/2016  . Weakness 01/29/2016  . HIV (human immunodeficiency virus infection) (HCC) 01/29/2016  . CAD (coronary artery disease) 01/29/2016  . COPD (chronic obstructive pulmonary disease) (HCC) 01/29/2016  . Diabetes mellitus (HCC) 01/29/2016  . Leg weakness, bilateral 01/13/2016  . Chest pain 06/24/2015    Past Surgical  History:  Procedure Laterality Date  . ANKLE ARTHROSCOPY    . IVC FILTER INSERTION    . KYPHOPLASTY N/A 12/21/2016   Procedure: KYPHOPLASTY;  Surgeon: Kennedy Bucker, MD;  Location: [facility] ORS;  Service: Orthopedics;  Laterality: N/A;  . PERIPHERAL VASCULAR CATHETERIZATION N/A 03/16/2016   Procedure: IVC Filter Insertion;  Surgeon: Renford Dills, MD;  Location: [facility] INVASIVE CV LAB;  Service: Cardiovascular;  Laterality: N/A;  . SINUS EXPLORATION    . WRIST ARTHROSCOPY Right     Prior to Admission medications   Medication Sig Start Date End Date Taking?  Authorizing Provider  abacavir-dolutegravir-lamiVUDine (TRIUMEQ) 600-50-300 MG tablet Take 1 tablet by mouth daily after breakfast. Reported on 01/30/2016 01/27/16   [provider]  acetaminophen (TYLENOL) 325 MG tablet Take 1 tablet (325 mg total) by mouth every 6 (six) hours as needed for mild pain (or Fever >/= 101). 03/15/17   Gouru, Aruna, MD  albuterol (PROVENTIL HFA;VENTOLIN HFA) 108 (90 BASE) MCG/ACT inhaler Inhale 2 puffs into the lungs every 6 (six) hours as needed for wheezing or shortness of breath.    [provider]  albuterol (PROVENTIL) (2.5 MG/3ML) 0.083% nebulizer solution Take 2.5 mg by nebulization 3 (three) times daily.     [provider]  atorvastatin (LIPITOR) 10 MG tablet Take 10 mg by mouth daily after breakfast. 11/16/16   [provider]  brimonidine (ALPHAGAN) 0.2 % ophthalmic solution  01/31/17   [provider]  Calcium Carbonate-Vitamin D (CALCIUM 600+D) 600-400 MG-UNIT tablet Take 1 tablet by mouth daily after breakfast.     [provider]  carvedilol (COREG) 12.5 MG tablet Take 12.5 mg by mouth 2 (two) times daily with a meal.    [provider]  cetirizine (ZYRTEC) 10 MG tablet Take 10 mg by mouth daily as needed for allergies.     [provider]  cyclobenzaprine (FLEXERIL) 5 MG tablet Take 1 tablet (5 mg total) by mouth 3 (three) times daily as needed for muscle spasms. 04/04/17   Earna Coder, MD  docusate sodium (COLACE) 50 MG capsule Take by mouth.    [provider]  enoxaparin (LOVENOX) 80 MG/0.8ML injection  04/13/17   [provider]  EPINEPHrine (EPIPEN 2-PAK) 0.3 mg/0.3 mL IJ SOAJ injection Inject 0.3 mg into the muscle once as needed (for severe allergic reaction).    [provider]  escitalopram (LEXAPRO) 20 MG tablet Take 20 mg by mouth daily after breakfast.     [provider]  fondaparinux (ARIXTRA) 7.5 MG/0.6ML SOLN injection Inject 0.6 mLs  (7.5 mg total) into the skin daily. 04/04/17   Earna Coder, MD  furosemide (LASIX) 20 MG tablet Take 20 mg by mouth daily.  10/22/16   [provider]  guaiFENesin (MUCINEX) 600 MG 12 hr tablet Take 1 tablet (600 mg total) by mouth 2 (two) times daily. 03/15/17   Ramonita Lab, MD  HYDROcodone-acetaminophen (NORCO) 5-325 MG tablet Take 1 tablet by mouth every 6 (six) hours as needed for moderate pain. 04/04/17   Earna Coder, MD  ketorolac (ACULAR) 0.5 % ophthalmic solution 1 drop 4 (four) times daily.    [provider]  levothyroxine (SYNTHROID, LEVOTHROID) 50 MCG tablet Take 1 tablet (50 mcg total) by mouth daily before breakfast. 03/05/17 04/04/17  Auburn Bilberry, MD  LINZESS 72 MCG capsule Take 72 mcg by mouth daily after breakfast. 11/10/16   [provider]  losartan (COZAAR) 25 MG tablet Take 25 mg  by mouth every morning. 03/03/17   [provider]  magnesium oxide (MAG-OX) 400 MG tablet Take 400 mg by mouth daily after breakfast.    [provider]  meloxicam (MOBIC) 7.5 MG tablet Take 7.5 mg by mouth daily as needed for pain.    [provider]  mirtazapine (REMERON) 15 MG tablet Take 15 mg by mouth daily.     [provider]  mometasone-formoterol (DULERA) 200-5 MCG/ACT AERO Inhale 2 puffs into the lungs 3 (three) times daily.     [provider]  Multiple Vitamin (MULTIVITAMIN WITH MINERALS) TABS tablet Take 1 tablet by mouth daily. ONE-A-DAY MULTIVITAMIN 50+    [provider]  nitroGLYCERIN (NITROSTAT) 0.4 MG SL tablet Place 0.4 mg under the tongue every 5 (five) minutes as needed for chest pain.    [provider]  ofloxacin (OCUFLOX) 0.3 % ophthalmic solution  01/31/17   [provider]  omeprazole (PRILOSEC) 40 MG capsule Take 40 mg by mouth daily after breakfast. 11/16/16   [provider]  Potassium Chloride CR (MICRO-K) 8 MEQ CPCR capsule CR Take 1 capsule by mouth  daily after breakfast. 11/15/16   [provider]  sennosides-docusate sodium (SENOKOT-S) 8.6-50 MG tablet Take 1 tablet by mouth at bedtime as needed for constipation.    [provider]  tiotropium (SPIRIVA) 18 MCG inhalation capsule Place 18 mcg into inhaler and inhale daily.    [provider]  traMADol (ULTRAM) 50 MG tablet Take by mouth every 6 (six) hours as needed. Pt taking twice a day    [provider]  zolpidem (AMBIEN) 5 MG tablet Take 5 mg by mouth at bedtime. 10/15/16   [provider]    Allergies Aspirin; Bee venom; Penicillins; Prednisone; Sulfa antibiotics; Sulfasalazine; Theophyllines; and Theophylline  Family History  Problem Relation Age of Onset  . CAD Unknown   . Asthma Mother   . Cirrhosis Father   . Deep vein thrombosis Neg Hx   . Diabetes Neg Hx     Social History Social History  Substance Use Topics  . Smoking status: Former Smoker    Quit date: 12/16/2001  . Smokeless tobacco: Never Used  . Alcohol use No    Review of Systems Constitutional: Negative for fever. + generalized weakness Eyes: Negative for visual changes. ENT: Negative for facial injury or neck injury Cardiovascular: + chest wall pain. Respiratory: Negative for shortness of breath.  Gastrointestinal: Negative for abdominal pain or injury. Genitourinary: Negative for dysuria. Musculoskeletal: Negative for back injury, negative for arm or leg pain. Skin: Negative for laceration/abrasions. Neurological: + head injury, slurred speech, confusion   ____________________________________________   PHYSICAL EXAM:  VITAL SIGNS: ED Triage Vitals  Enc Vitals Group     BP 04/26/17 2002 115/78     Pulse Rate 04/26/17 2002 74     Resp 04/26/17 2002 19     Temp 04/26/17 2002 98.2 F (36.8 C)     Temp Source 04/26/17 2002 Oral     SpO2 04/26/17 2002 98 %     Weight 04/26/17 2003 184 lb (83.5 kg)     Height 04/26/17 2003  (1.651 m)     Head  Circumference --      Peak Flow --      Pain Score 04/26/17 2001 10     Pain Loc --      Pain Edu? --      Excl. in GC? --    Constitutional:  Alert and oriented. No acute distress. Does not appear intoxicated. HEENT Head: Normocephalic and atraumatic. Face: No facial bony tenderness. Stable midface Ears: No hemotympanum bilaterally. No Battle sign Eyes: No eye injury. PERRL. No raccoon eyes Nose: Nontender. No epistaxis. No rhinorrhea. there is bruising to the bridge of the nose Mouth/Throat: Mucous membranes are moist. No oropharyngeal blood. No dental injury. Airway patent without stridor. Normal voice. Neck: no C-collar in place. No midline c-spine tenderness.  Cardiovascular: Normal rate, regular rhythm. Normal and symmetric distal pulses are present in all extremities. Pulmonary/Chest: Chest wall is stable and nontender to palpation/compression. Normal respiratory effort. Breath sounds are normal. No crepitus.  Abdominal: Soft, nontender, non distended. Musculoskeletal: patient with abrasions to bilateral knees and pain with flexion of the knees.Nontender with normal full range of motion in all other joints. No deformities. No thoracic or lumbar midline spinal tenderness. Pelvis is stable. Skin: Skin is warm, dry and intact. patient has several bruises of different ages in all extremities Psychiatric: Speech and behavior are appropriate. Neurological: slightly slurred speech. Moves all extremities to command. No gross focal neurologic deficits are appreciated.  Glascow Coma Score: 4 - Opens eyes on own 6 - Follows simple motor commands 5 - Alert and oriented GCS: 15  ____________________________________________   LABS (all labs ordered are listed, but only abnormal results are displayed)  Labs Reviewed  CBC WITH DIFFERENTIAL/PLATELET - Abnormal; Notable for the following:       Result Value   RBC 3.71 (*)    Hemoglobin 12.5 (*)    HCT 36.8 (*)    RDW 15.6 (*)    All  other components within normal limits  BASIC METABOLIC PANEL - Abnormal; Notable for the following:    Potassium 3.3 (*)    Creatinine, Ser 1.43 (*)    GFR calc non Af Amer 52 (*)    All other components within normal limits  URINALYSIS, COMPLETE (UACMP) WITH MICROSCOPIC - Abnormal; Notable for the following:    Color, Urine YELLOW (*)    APPearance CLEAR (*)    All other components within normal limits  TROPONIN I  PROTIME-INR  APTT   ____________________________________________  EKG  ED ECG REPORT I, Nita Sickle, the attending physician, personally viewed and interpreted this ECG.  Normal sinus rhythm, rate of 74, prolonged QTC, normal axis, diffuse T-wave flattening, no ST elevations or depressions.no significant changes when compared to prior from August 2018 ____________________________________________  RADIOLOGY  CT head and cspine; 1. No CT evidence for acute intracranial abnormality. Stable small vessel ischemic changes of the white matter 2. Stable cervical spine alignment. No definite acute osseous abnormality  XR L knee:  Diffuse soft tissue edema and trace suprapatellar effusion. No acute osseous abnormality.  XR R knee: 1. No acute osseous abnormality 2. Trace suprapatellar effusion   CXR: No active cardiopulmonary disease  ____________________________________________   PROCEDURES  Procedure(s) performed: None Procedures Critical Care performed:  None ____________________________________________   INITIAL IMPRESSION / ASSESSMENT AND PLAN / ED COURSE  61 y.o. male with a history of HIV, DVT on Lovenox shots, hypertension, diabetes, CAD, COPD, anemia presents for evaluation of a fall. since patient has been feeling weaker than normal with several recent falls and many comorbidities will check blood work to rule out worsening anemia, electrolyte abnormalities, dehydration. Patient had had trauma and is on Lovenox therefore will undergo a head CT  and CT cervical spine. We'll x-ray bilateral knees and his chest which are all the areas he  is complaining of pain. We'll check a UA to rule out infection as possible source for patient's generalized weakness. EKG with no ischemic changes.    _________________________ 11:52 PM on 04/26/2017 ----------------------------------------- labs and imaging with no acute findings. The patient's sister is refusing to take patient back because he lives in a boarding house and has to manage his own medications. She is afraid that he is mixing some of his medications. Patient will spend the night in the emergency room to see a social worker in the morning for placement.   Pertinent labs & imaging results that were available during my care of the patient were reviewed by me and considered in my medical decision making (see chart for details).    ____________________________________________   FINAL CLINICAL IMPRESSION(S) / ED DIAGNOSES  Final diagnoses:  Fall, initial encounter  Confusion  Generalized weakness      NEW MEDICATIONS STARTED DURING THIS VISIT:  New Prescriptions   No medications on file     Note:  This document was prepared using Dragon voice recognition software and may include unintentional dictation errors.    Nita Sickle, MD 04/26/17 [phone]

## 2017-04-26 NOTE — ED Triage Notes (Addendum)
Pt arrived via EMS from boarding house with complaints of falling. Pt has complaints of left arm and leg pain along with chest pain. Pt states he has been falling all month.  VS per EMS were all WNL. BP-138/74 HR-77 BS-104. Pt has a Hx of stroke, HTN, diabetes, and has had two heart attacks. Allergies include Penicillin and Sulfur. Pt reports hematomas to skin that are not coming from the falls. Pt states chest has been hurting for about a week. EMS states that last week the doctor said last week "he had blood clots in his legs." Sister also states that speech has been a little slurred. EMS stated a clear 12-lead.

## 2017-04-26 NOTE — ED Notes (Signed)
Williemae Natter (sister) [phone]

## 2017-04-27 DIAGNOSIS — R410 Disorientation, unspecified: Secondary | ICD-10-CM | POA: Diagnosis not present

## 2017-04-27 NOTE — Clinical Social Work Note (Addendum)
CSW met with pt at bedside to address consult for "Nursing home placement." RNCM was in attendance. Pt currently lives in a boarding house and states he has a Education officer, museum through [employer]. Pt was at [facility] in June and August of this year. Pt states he left because he "wanted to leave." Pt states he is not interested in returning to a SNF.   CSW came back to bedside to discuss family care home placement with pt and pt states he is unsure about that option. Pt states his sister Brian Hampton is considering letting pt live with her and she is coming to the ED shortly. Pt gave CSW permission to speak with sister. Per RNCM, [facility] and Brian Hampton is aware pt is in ED. CSW and RNCM updated Dr. Jimmye Norman, who will put in psych consult and a spiritual care consult.   CSW met with pt and sister Brian Hampton at bedside. Pt agreeable to return to the boarding house at discharge. Sister asked that pt be admitted for a neurology consult. CSW conveyed that pt is medically cleared, per MD, and can follow up with neurologist outpatient. CSW encouraged pt and sister to speak with RN and MD about this before leaving today. Pt and sister both verified that pt DOES NOT have a legal guardian, but sister Brian Hampton is pt's HCPOA. CSW updated note in [place]. Pt is still awaiting psych consult. Sister appeared very supportive at bedside.   CSW also spoke with Brian Hampton [phone]) with [facility]. Ms. Hampton confirmed pt is with [facility] and made his RN aware of ED visit. Ms. Hampton states she is going to refer pt to the [facility] Day [facility] Care program. CSW updated MD [place]. CSW signing off as no further Social Work needs identified.   Brian Hampton, [place], [place] Social Worker-Ed [phone]

## 2017-04-27 NOTE — ED Notes (Signed)
Pt states he wants to go home. Pt states he is is not a ward of the state and is his own guardian. Pt states his sister will call his DSS caseworker to try to set up services in the boarding house. Dr. Alphonzo Lemmings notified of pt wish to go home.

## 2017-04-27 NOTE — ED Provider Notes (Signed)
Vitals:   04/27/17 0500 04/27/17 0600  BP: (!) 103/92 (!) 91/58  Pulse: 80 (!) 30  Resp:    Temp:    SpO2: 99% 100%    ----------------------------------------- 8:03 AM on 04/27/2017 -----------------------------------------   Blood pressure (!) 91/58, pulse (!) 30, temperature 98.2 F (36.8 C), temperature source Oral, resp. rate 18, height  (1.651 m), weight 83.5 kg (184 lb), SpO2 100 %.  The patient had no acute events since last update.  Awaiting social work consult.  Heart rate noted at 30, discussed with nursing reports that they believe this likely not accurate. Rechecking now. Ongoing care assigned to Dr. Mayford Knife, patient awaiting social work consult.    Sharyn Creamer, MD 04/27/17 [phone]

## 2017-04-27 NOTE — BH Assessment (Addendum)
Assessment Note  Brian Hampton is an 61 y.o. male presenting voluntarily to ED. Pt resides at boarding home and reports no issues in the home environment. Pt identifies various medical conditions and medications as ongoing stressors. Pt denies suicidal ideation and states "my life means too much to me". Pt denies h/o suicide attempt and self-harm. Pt denies HI/thoughts of harm towards others. Pt reports no h/o violence or aggression. Pt denies AVTOH. Pt does not appear to be responding to internal stimuli or experiencing delusional thought content. Pt reports he is followed by a therapist Eunice Blase) who provides weekly counseling in the home. Pt also reports receiving RN care in the home.    Past Medical History:  Past Medical History:  Diagnosis Date  . Anemia   . Asthma   . Cataracts, bilateral    worse in Rt eye  . Collagen vascular disease (HCC)   . COPD (chronic obstructive pulmonary disease) (HCC)   . Coronary artery disease   . Depression   . DVT (deep venous thrombosis) (HCC)   . Dysrhythmia   . Emphysema/COPD (HCC)   . Environmental and seasonal allergies   . H/O blood clots   . Heart murmur   . HIV (human immunodeficiency virus infection) (HCC)   . Hypertension   . Leaky heart valve    x 3  . Lung mass   . Myocardial infarction (HCC)    in 2000  . Pneumonia    year ago  . Pulmonary emboli (HCC)   . Type 2 diabetes mellitus (HCC)     Past Surgical History:  Procedure Laterality Date  . ANKLE ARTHROSCOPY    . IVC FILTER INSERTION    . KYPHOPLASTY N/A 12/21/2016   Procedure: KYPHOPLASTY;  Surgeon: Kennedy Bucker, MD;  Location: [facility] ORS;  Service: Orthopedics;  Laterality: N/A;  . PERIPHERAL VASCULAR CATHETERIZATION N/A 03/16/2016   Procedure: IVC Filter Insertion;  Surgeon: Renford Dills, MD;  Location: [facility] INVASIVE CV LAB;  Service: Cardiovascular;  Laterality: N/A;  . SINUS EXPLORATION    . WRIST ARTHROSCOPY Right     Family History:  Family History  Problem  Relation Age of Onset  . CAD Unknown   . Asthma Mother   . Cirrhosis Father   . Deep vein thrombosis Neg Hx   . Diabetes Neg Hx     Social History:  reports that he quit smoking about 15 years ago. He has never used smokeless tobacco. He reports that he does not drink alcohol or use drugs.  Additional Social History:  Alcohol / Drug Use Pain Medications: Pt denies abuse. Prescriptions: Pt denies abuse. Over the Counter: Pt denies abuse. History of alcohol / drug use?: No history of alcohol / drug abuse  CIWA: CIWA-Ar BP: (!) 142/81 Pulse Rate: 91 COWS:    Allergies:  Allergies  Allergen Reactions  . Aspirin Anaphylaxis  . Bee Venom Anaphylaxis  . Penicillins Anaphylaxis and Other (See Comments)    Has patient had a PCN reaction causing immediate rash, facial/tongue/throat swelling, SOB or lightheadedness with hypotension: Yes Has patient had a PCN reaction causing severe rash involving mucus membranes or skin necrosis: No Has patient had a PCN reaction that required hospitalization No Has patient had a PCN reaction occurring within the last 10 years: Yes If all of the above answers are "NO", then may proceed with Cephalosporin use.  . Prednisone Anaphylaxis  . Sulfa Antibiotics Anaphylaxis  . Sulfasalazine Anaphylaxis  . Theophyllines Anaphylaxis  . Theophylline  Swelling    Home Medications:  (Not in a hospital admission)  OB/GYN Status:  No LMP for male patient.  General Assessment Data Location of Assessment: [facility] ED TTS Assessment: In system Is this a Tele or Face-to-Face Assessment?: Face-to-Face Is this an Initial Assessment or a Re-assessment for this encounter?: Initial Assessment Marital status: Widowed Is patient pregnant?: No Pregnancy Status: No Living Arrangements: Other (Comment) (boarding house) Can pt return to current living arrangement?: Yes Admission Status: Voluntary Is patient capable of signing voluntary admission?: Yes Referral Source:  Self/Family/Friend Insurance type: Medicare     Crisis Care Plan Living Arrangements: Other (Comment) (boarding house) Name of Psychiatrist: None Name of Therapist: Debbie Arts administrator)  Education Status Is patient currently in school?: No Highest grade of school patient has completed: 10  Risk to self with the past 6 months Suicidal Ideation:  ("No, my life means to much to me") Has patient been a risk to self within the past 6 months prior to admission? : No Suicidal Intent: No Has patient had any suicidal intent within the past 6 months prior to admission? : No Is patient at risk for suicide?: No Suicidal Plan?: No Has patient had any suicidal plan within the past 6 months prior to admission? : No Access to Means: No What has been your use of drugs/alcohol within the last 12 months?: Pt denies drug/alcohol use. Previous Attempts/Gestures: No Intentional Self Injurious Behavior: None Family Suicide History: No Recent stressful life event(s): Other (Comment) (medical concerns) Persecutory voices/beliefs?: No Depression: Yes Depression Symptoms:  (pt denies depressive sxs) Substance abuse history and/or treatment for substance abuse?: No Suicide prevention information given to non-admitted patients: Yes  Risk to Others within the past 6 months Homicidal Ideation: No Does patient have any lifetime risk of violence toward others beyond the six months prior to admission? : No Thoughts of Harm to Others: No Current Homicidal Intent: No Current Homicidal Plan: No History of harm to others?: No Assessment of Violence: None Noted Does patient have access to weapons?: No Criminal Charges Pending?: No Does patient have a court date: No Is patient on probation?: No  Psychosis Hallucinations: None noted Delusions: None noted  Mental Status Report Appearance/Hygiene: In hospital gown Eye Contact: Good Motor Activity: Unremarkable Speech: Logical/coherent Level of  Consciousness: Alert Mood: Pleasant Affect: Appropriate to circumstance Anxiety Level: None Thought Processes: Coherent, Relevant Judgement: Unimpaired Orientation: Person, Place, Situation Obsessive Compulsive Thoughts/Behaviors: None  Cognitive Functioning Concentration: Normal Memory: Remote Intact, Recent Intact IQ: Average Insight: Good Impulse Control: Good Appetite: Good Weight Loss: 13 Weight Gain: 0 Sleep: Increased ("its getting better, back to normal") Total Hours of Sleep: 8 Vegetative Symptoms: None  ADLScreening [facility] Assessment Services) Patient's cognitive ability adequate to safely complete daily activities?: Yes Patient able to express need for assistance with ADLs?: Yes Independently performs ADLs?: No  Prior Inpatient Therapy Prior Inpatient Therapy: Yes Prior Therapy Facilty/Provider(s): [facility] Reason for Treatment: SI  Prior Outpatient Therapy Prior Outpatient Therapy: Yes Prior Therapy Dates: Ongoing Prior Therapy Facilty/Provider(s): [facility] Reason for Treatment: adjustment issues Does patient have an ACCT team?: No Does patient have Intensive In-House Services?  : No Does patient have [facility] services? : No Does patient have P4CC services?: No  ADL Screening (condition at time of admission) Patient's cognitive ability adequate to safely complete daily activities?: Yes Is the patient deaf or have difficulty hearing?: No Does the patient have difficulty seeing, even when wearing glasses/contacts?: No Does the patient have difficulty concentrating, remembering, or  making decisions?: No Patient able to express need for assistance with ADLs?: Yes Does the patient have difficulty dressing or bathing?: No Independently performs ADLs?: No Communication: Independent Dressing (OT): Independent Grooming: Independent Feeding: Independent Bathing: Independent Toileting: Independent In/Out Bed: Independent Walks in Home: Independent with device  (comment) ([employer]) Does the patient have difficulty walking or climbing stairs?: Yes ([employer]) Weakness of Legs: Both Weakness of Arms/Hands: None  Home Assistive Devices/Equipment Home Assistive Devices/Equipment: Medical laboratory scientific officer (specify quad or straight), Environmental consultant (specify type) Environmental manager and Buyer, retail)  Therapy Consults (therapy consults require a physician order) PT Evaluation Needed: No OT Evalulation Needed: No SLP Evaluation Needed: No Abuse/Neglect Assessment (Assessment to be complete while patient is alone) Physical Abuse: Denies Verbal Abuse: Denies Sexual Abuse: Denies Exploitation of patient/patient's resources: Denies Self-Neglect: Denies Values / Beliefs Cultural Requests During Hospitalization: None Spiritual Requests During Hospitalization: None Consults Spiritual Care Consult Needed: No Social Work Consult Needed: No Merchant navy officer (For Healthcare) Does Patient Have a Medical Advance Directive?: Yes Does patient want to make changes to medical advance directive?: No - Patient declined Type of Advance Directive: Living will, Healthcare Power of Attorney Copy of Healthcare Power of Attorney in Chart?: Yes (Pt reports copy has been giving) Copy of Living Will in Chart?: Yes (Pt reports copy has been given) Would patient like information on creating a medical advance directive?: No - Patient declined    Additional Information 1:1 In Past 12 Months?: No CIRT Risk: No Elopement Risk: No Does patient have medical clearance?: Yes     Disposition:  Disposition Initial Assessment Completed for this Encounter: Yes Disposition of Patient: Other dispositions Other disposition(s): Other (Comment) (Pending Psych MD Consult)  On Site Evaluation by:   Reviewed with Physician:    Heinz Eckert J [place] 04/27/2017 3:46 PM

## 2017-04-27 NOTE — ED Notes (Signed)
Pt received phone call from his sister, phone given to pt.

## 2017-04-27 NOTE — ED Notes (Signed)
Pt given lunch tray.

## 2017-04-27 NOTE — Consult Note (Signed)
   [facility] CM Inpatient Consult   04/27/2017  Brian Hampton Jul 19, 1956 [id number]   Received a call from [facility] inpatient social Brian Hampton about patient's involvement with [facility]. Confirmed patient was currently receiving services from [facility]. Was in [facility] office when received the call and was able to speak with current RN community care manager Brian Hampton, who confirms she will follow up with patient tomorrow. Also placed a [facility] social work referral to evaluate for a 30 day reduction in care through [facility] related to patient having 5 ED visits and 5 admits in the last 6 months. Rose confirmed she has patient's and patient's sister Brian Hampton's phone number for follow up.   Costella Hatcher RN, BSN [facility] Liaison  [phone]) Business Mobile [phone]) Toll free office

## 2017-04-27 NOTE — Progress Notes (Signed)
Patient was unavailable to speak with [facility]. [facility] will provide a follow-up consult upon requests. Patient had a food tray and was with a medical staff member.    04/27/17 1215  Clinical Encounter Type  Visited With Patient;Health care provider  Visit Type Initial;Spiritual support;ED  Referral From Nurse

## 2017-04-27 NOTE — Care Management Note (Signed)
Case Management Note  Patient Details  Name: Brian Hampton MRN: [id number] Date of Birth: 04-18-1956  Subjective/Objective:        Spoke to patient with CSW  In attendance. We have told him we are here to assess his needs. He has help with his medications, and says that [facility] has a CSW that visits him. He likes the boarding house, and dislike being in a facility previously. He says that he has been told by Brian Hampton and Brian Hampton that there is no medical treatment for his chronic problems, but he is still unsure of why the ER docs cannot  Just make him feel better.  He vocalises also that he just does not want to live this way any longer. The patient says that at one point during the several visits he has made to the hospital he did see Brian Hampton, and he mentions that the psychiatrist talked to him about going somewhere in [place] for 30 days. We have shared the conversation with Brian Hampton, and he will put in a psych consult. We will  Follow the patient until a dispo. Is determined.       Action/Plan:   Expected Discharge Date:                  Expected Discharge Plan:     In-House Referral:     Discharge planning Services     Post Acute Care Choice:    Choice offered to:     DME Arranged:    DME Agency:     HH Arranged:    HH Agency:     Status of Service:     If discussed at [employer] of Stay Meetings, dates discussed:    Additional Comments:  Brian Bue, RN 04/27/2017, 11:42 AM

## 2017-04-27 NOTE — Progress Notes (Signed)
Please note patient is currently followed by OUT patient Palliative care team. Hospital care team made aware. Thank you. Dayna Barker RN, BSN, [facility] [place], [facility] [facility] [phone] c

## 2017-04-28 ENCOUNTER — Other Ambulatory Visit: Payer: Self-pay | Admitting: *Deleted

## 2017-04-28 DIAGNOSIS — I82401 Acute embolism and thrombosis of unspecified deep veins of right lower extremity: Secondary | ICD-10-CM | POA: Diagnosis not present

## 2017-04-28 DIAGNOSIS — I129 Hypertensive chronic kidney disease with stage 1 through stage 4 chronic kidney disease, or unspecified chronic kidney disease: Secondary | ICD-10-CM | POA: Diagnosis not present

## 2017-04-28 DIAGNOSIS — N189 Chronic kidney disease, unspecified: Secondary | ICD-10-CM | POA: Diagnosis not present

## 2017-04-28 DIAGNOSIS — E1122 Type 2 diabetes mellitus with diabetic chronic kidney disease: Secondary | ICD-10-CM | POA: Diagnosis not present

## 2017-04-28 DIAGNOSIS — I2510 Atherosclerotic heart disease of native coronary artery without angina pectoris: Secondary | ICD-10-CM | POA: Diagnosis not present

## 2017-04-28 DIAGNOSIS — J439 Emphysema, unspecified: Secondary | ICD-10-CM | POA: Diagnosis not present

## 2017-04-28 NOTE — Patient Outreach (Signed)
[facility]) Care Management  04/28/2017  Brian Hampton [birth year] [id number]   Collaboration phone call with [facility] RNCM Jodi Mourning to discuss anticipated community resource needs for patient. Joint home visit scheduled for 06/05/17.    [name] [facility] Care Management [phone]

## 2017-04-28 NOTE — Patient Outreach (Signed)
Unsuccessful telephone encounter to Brian Hampton, 61 year old male- follow up on recent ED visits September 16-17,2018 for  fall, leg pain and September 18-19,2018 for fall, confusion,weakness.  Pt has history but not limited to CAD,COPD, HIV, Anemia,  GERD, right leg DVT.   Unable to leave a voice message on pt's phone as message on phone not a working number.    Plan:  RN CM to try again tomorrow.    Shayne Alken.   Hiro Vipond RN CCM [facility] Care Management  [phone]

## 2017-04-29 ENCOUNTER — Other Ambulatory Visit: Payer: Self-pay | Admitting: *Deleted

## 2017-04-29 DIAGNOSIS — R079 Chest pain, unspecified: Secondary | ICD-10-CM | POA: Diagnosis not present

## 2017-04-29 NOTE — Patient Outreach (Signed)
Successful telephone encounter to Isa Garate,61 year old male for follow up on recent ED visits September 16-17,2018 for fall, leg pain and September 18-19, 2018 for fall, confusion, weakness.  Spoke with pt, HIPAA identifiers verified, discussed purpose of call- follow up on recent ED visits.  Pt reports had stress test today, to follow up with Dr. Welton Flakes 05/02/17 for Echogram of heart/lungs/left hip/left leg.  Pt reports no more falls, pain in right leg is gone but continues to have pain in left hip/leg.  Pt reports taking all of his medications.   RN CM discussed with pt doing a joint home visit next week with [facility] LCSW to which pt agreed, reports he needs to talk to a Child psychotherapist.    Plan:  As discussed with pt, plan to follow up again next week - joint home visit with [facility] LCSW.   Shayne Alken.   Shean Gerding RN CCM [facility] Care Management  [phone]

## 2017-04-29 NOTE — Patient Outreach (Signed)
12:51 pm-  Second unsuccessful telephone encounter to Jaythen Hamme, 61 year old male for follow up on recent ED visits September 16-17,2018 for fall, leg pain  and September 18-19,2018 for fall, confusion,weakness.   Unable to leave a voice message as message on phone not a working number.    1:02 pm - Successful telephone encounter to [employer], on consent form), HIPAA identifiers (name, date of birth) provided on pt by sister.   RN CM discussed purpose of call- follow up on pt's recent ED visits, unable to contact pt.   Sister reports pt is with her now, had a stress test today, to see Dr. Welton Flakes 05/02/17- scheduled to have a EKG of heart, check left hip/leg.  RN CM requested to speak with pt to which sister reports driving/on her blue tooth, pt unable to talk at this time.  Sister did provide a different contact number for pt [phone], to follow up with pt later today.   Sister reports something is wrong with pt, is in and out of it,right hand hematoma draining, remains at rooming house.    Plan:  As discussed with sister, plan to follow up with pt later today.    Shayne Alken.   Rolando Hessling RN CCM [facility] Care Management  [phone]

## 2017-05-01 ENCOUNTER — Emergency Department
Admission: EM | Admit: 2017-05-01 | Discharge: 2017-05-02 | Disposition: A | Discharge status: Home / Self Care | Payer: Medicare Other | Source: Home / Self Care | Attending: Emergency Medicine | Admitting: Emergency Medicine

## 2017-05-01 ENCOUNTER — Emergency Department: Payer: Medicare Other

## 2017-05-01 DIAGNOSIS — R531 Weakness: Secondary | ICD-10-CM | POA: Diagnosis not present

## 2017-05-01 DIAGNOSIS — M879 Osteonecrosis, unspecified: Secondary | ICD-10-CM | POA: Diagnosis not present

## 2017-05-01 DIAGNOSIS — M25569 Pain in unspecified knee: Secondary | ICD-10-CM | POA: Diagnosis not present

## 2017-05-01 DIAGNOSIS — M79606 Pain in leg, unspecified: Secondary | ICD-10-CM

## 2017-05-01 DIAGNOSIS — S3993XA Unspecified injury of pelvis, initial encounter: Secondary | ICD-10-CM | POA: Diagnosis not present

## 2017-05-01 DIAGNOSIS — Z87891 Personal history of nicotine dependence: Secondary | ICD-10-CM | POA: Insufficient documentation

## 2017-05-01 DIAGNOSIS — Z7901 Long term (current) use of anticoagulants: Secondary | ICD-10-CM

## 2017-05-01 DIAGNOSIS — I10 Essential (primary) hypertension: Secondary | ICD-10-CM

## 2017-05-01 DIAGNOSIS — S8992XA Unspecified injury of left lower leg, initial encounter: Secondary | ICD-10-CM | POA: Diagnosis not present

## 2017-05-01 DIAGNOSIS — M25562 Pain in left knee: Secondary | ICD-10-CM | POA: Diagnosis not present

## 2017-05-01 DIAGNOSIS — Z79899 Other long term (current) drug therapy: Secondary | ICD-10-CM | POA: Insufficient documentation

## 2017-05-01 DIAGNOSIS — M6281 Muscle weakness (generalized): Secondary | ICD-10-CM

## 2017-05-01 DIAGNOSIS — W19XXXA Unspecified fall, initial encounter: Secondary | ICD-10-CM

## 2017-05-01 DIAGNOSIS — S2241XA Multiple fractures of ribs, right side, initial encounter for closed fracture: Secondary | ICD-10-CM | POA: Diagnosis not present

## 2017-05-01 DIAGNOSIS — J449 Chronic obstructive pulmonary disease, unspecified: Secondary | ICD-10-CM

## 2017-05-01 DIAGNOSIS — R55 Syncope and collapse: Secondary | ICD-10-CM

## 2017-05-01 DIAGNOSIS — M25561 Pain in right knee: Secondary | ICD-10-CM | POA: Diagnosis not present

## 2017-05-01 DIAGNOSIS — E119 Type 2 diabetes mellitus without complications: Secondary | ICD-10-CM | POA: Insufficient documentation

## 2017-05-01 DIAGNOSIS — S299XXA Unspecified injury of thorax, initial encounter: Secondary | ICD-10-CM | POA: Diagnosis not present

## 2017-05-01 DIAGNOSIS — J45909 Unspecified asthma, uncomplicated: Secondary | ICD-10-CM

## 2017-05-01 DIAGNOSIS — Z8673 Personal history of transient ischemic attack (TIA), and cerebral infarction without residual deficits: Secondary | ICD-10-CM | POA: Insufficient documentation

## 2017-05-01 DIAGNOSIS — R296 Repeated falls: Secondary | ICD-10-CM | POA: Diagnosis not present

## 2017-05-01 DIAGNOSIS — I2510 Atherosclerotic heart disease of native coronary artery without angina pectoris: Secondary | ICD-10-CM

## 2017-05-01 DIAGNOSIS — M25552 Pain in left hip: Secondary | ICD-10-CM | POA: Diagnosis not present

## 2017-05-01 LAB — CBC WITH DIFFERENTIAL/PLATELET
BASOS ABS: 0.1 10*3/uL (ref 0–0.1)
BASOS PCT: 0 %
EOS ABS: 0.1 10*3/uL (ref 0–0.7)
Eosinophils Relative: 1 %
HCT: 36.4 % — ABNORMAL LOW (ref 40.0–52.0)
HEMOGLOBIN: 12.3 g/dL — AB (ref 13.0–18.0)
Lymphocytes Relative: 9 %
Lymphs Abs: 1.1 10*3/uL (ref 1.0–3.6)
MCH: 33.8 pg (ref 26.0–34.0)
MCHC: 33.9 g/dL (ref 32.0–36.0)
MCV: 99.6 fL (ref 80.0–100.0)
Monocytes Absolute: 0.7 10*3/uL (ref 0.2–1.0)
Monocytes Relative: 6 %
NEUTROS PCT: 84 %
Neutro Abs: 10.3 10*3/uL — ABNORMAL HIGH (ref 1.4–6.5)
Platelets: 301 10*3/uL (ref 150–440)
RBC: 3.65 MIL/uL — AB (ref 4.40–5.90)
RDW: 15.7 % — ABNORMAL HIGH (ref 11.5–14.5)
WBC: 12.3 10*3/uL — AB (ref 3.8–10.6)

## 2017-05-01 LAB — URINALYSIS, COMPLETE (UACMP) WITH MICROSCOPIC
BILIRUBIN URINE: NEGATIVE
Bacteria, UA: NONE SEEN
Glucose, UA: NEGATIVE mg/dL
HGB URINE DIPSTICK: NEGATIVE
Ketones, ur: NEGATIVE mg/dL
LEUKOCYTES UA: NEGATIVE
NITRITE: NEGATIVE
PROTEIN: 30 mg/dL — AB
SPECIFIC GRAVITY, URINE: 1.017 (ref 1.005–1.030)
pH: 5 (ref 5.0–8.0)

## 2017-05-01 LAB — COMPREHENSIVE METABOLIC PANEL
ALT: 16 U/L — AB (ref 17–63)
AST: 27 U/L (ref 15–41)
Albumin: 3 g/dL — ABNORMAL LOW (ref 3.5–5.0)
Alkaline Phosphatase: 196 U/L — ABNORMAL HIGH (ref 38–126)
Anion gap: 10 (ref 5–15)
BUN: 22 mg/dL — ABNORMAL HIGH (ref 6–20)
CHLORIDE: 102 mmol/L (ref 101–111)
CO2: 27 mmol/L (ref 22–32)
Calcium: 8.6 mg/dL — ABNORMAL LOW (ref 8.9–10.3)
Creatinine, Ser: 1.31 mg/dL — ABNORMAL HIGH (ref 0.61–1.24)
GFR, EST NON AFRICAN AMERICAN: 58 mL/min — AB (ref >60)
Glucose, Bld: 101 mg/dL — ABNORMAL HIGH (ref 65–99)
POTASSIUM: 3.8 mmol/L (ref 3.5–5.1)
SODIUM: 139 mmol/L (ref 135–145)
Total Bilirubin: 1.3 mg/dL — ABNORMAL HIGH (ref 0.3–1.2)
Total Protein: 7.5 g/dL (ref 6.5–8.1)

## 2017-05-01 LAB — TROPONIN I: TROPONIN I: 0.03 ng/mL — AB (ref <0.03)

## 2017-05-01 LAB — CK: CK TOTAL: 319 U/L (ref 49–397)

## 2017-05-01 MED ORDER — MORPHINE SULFATE (PF) 4 MG/ML IV SOLN
4.0000 mg | Freq: Once | INTRAVENOUS | Status: AC
  Administered 2017-05-01: 4 mg via INTRAVENOUS
  Filled 2017-05-01: qty 1

## 2017-05-01 MED ORDER — SODIUM CHLORIDE 0.9 % IV BOLUS (SEPSIS)
1000.0000 mL | Freq: Once | INTRAVENOUS | Status: AC
  Administered 2017-05-01: 1000 mL via INTRAVENOUS

## 2017-05-01 MED ORDER — ONDANSETRON HCL 4 MG/2ML IJ SOLN
4.0000 mg | Freq: Once | INTRAMUSCULAR | Status: AC
  Administered 2017-05-01: 4 mg via INTRAVENOUS
  Filled 2017-05-01: qty 2

## 2017-05-01 NOTE — ED Notes (Signed)
Pt sitting up eating [employer]

## 2017-05-01 NOTE — Discharge Instructions (Signed)
You were evaluated for falling and nearly passing out, and although no certain cause was found, I suspect this may be due to some dehydration.  Return to the emergency department immediately for any worsening symptoms including weakness, numbness, confusion or altered mental status, no worsening pain, skin rash, or any other symptoms concerning to you.

## 2017-05-01 NOTE — ED Triage Notes (Signed)
Per EMS pt comes from home (a boarding house) and fell last night 8:00.  Pt was unable to get up and called EMS this morning for help.  Pt has abrasions on both knees and states generalized pain 10/10.  Pt is A&Ox4.

## 2017-05-01 NOTE — ED Notes (Signed)
This nurse notified by Tech tammy that patient was found by her on the floor near toilet due to his knee buckling. Pt was placed in the bed and MD notified

## 2017-05-01 NOTE — ED Provider Notes (Signed)
[facility] Emergency Department Provider Note ____________________________________________   I have reviewed the triage vital signs and the triage nursing note.  HISTORY  Chief Complaint Fall   Historian Patient  HPI Brian Hampton is a 61 y.o. male presents for falling onto the ground this morning around 3 AM. States that he felt lightheaded and his legs collapsed and he landed on his knees. States that he just feels like his legs hurt all over. He finally called and was able to alert his neighbor to come over to call EMS this morning.  Denies headache or head injury. Denies neck pain or back pain. Denies recent fevers, cough, chest pain, or trouble breathing.  Denies abdominal pain, vomiting, or diarrhea.    Past Medical History:  Diagnosis Date  . Anemia   . Asthma   . Cataracts, bilateral    worse in Rt eye  . Collagen vascular disease (HCC)   . COPD (chronic obstructive pulmonary disease) (HCC)   . Coronary artery disease   . Depression   . DVT (deep venous thrombosis) (HCC)   . Dysrhythmia   . Emphysema/COPD (HCC)   . Environmental and seasonal allergies   . H/O blood clots   . Heart murmur   . HIV (human immunodeficiency virus infection) (HCC)   . Hypertension   . Leaky heart valve    x 3  . Lung mass   . Myocardial infarction (HCC)    in 2000  . Pneumonia    year ago  . Pulmonary emboli (HCC)   . Type 2 diabetes mellitus [facility])     Patient Active Problem List   Diagnosis Date Noted  . Acute deep vein thrombosis (DVT) of femoral vein of right lower extremity (HCC) 04/04/2017  . Left leg pain 03/22/2017  . DVT (deep venous thrombosis) (HCC) 03/12/2017  . Acute deep vein thrombosis (DVT) of right lower extremity (HCC) 02/25/2017  . Postoperative pain   . COPD exacerbation (HCC) 12/23/2016  . Abdominal pain   . Compression fracture of body of thoracic vertebra (HCC) 12/21/2016  . Hypomagnesemia 05/05/2016  . Rhabdomyolysis  05/05/2016  . Acute pulmonary embolism (HCC) 03/17/2016  . Right leg DVT (HCC) 03/17/2016  . Hypokalemia 03/17/2016  . S/P IVC filter 03/17/2016  . SOB (shortness of breath) 03/15/2016  . Dysphagia 02/02/2016  . GERD (gastroesophageal reflux disease) 02/02/2016  . Hyperthyroidism 01/31/2016  . 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 Elevated transaminase level 01/31/2016  . Anemia 01/31/2016  . Thrombocytopenia (HCC) 01/31/2016  . Pyuria 01/31/2016  . Weakness 01/29/2016  . HIV (human immunodeficiency virus infection) (HCC) 01/29/2016  . CAD (coronary artery disease) 01/29/2016  . COPD (chronic obstructive pulmonary disease) (HCC) 01/29/2016  . Diabetes mellitus (HCC) 01/29/2016  . Leg weakness, bilateral 01/13/2016  . Chest pain 06/24/2015    Past Surgical History:  Procedure Laterality Date  . ANKLE ARTHROSCOPY    . IVC FILTER INSERTION    . KYPHOPLASTY N/A 12/21/2016   Procedure: KYPHOPLASTY;  Surgeon: Kennedy Bucker, MD;  Location: [facility] ORS;  Service: Orthopedics;  Laterality: N/A;  . PERIPHERAL VASCULAR CATHETERIZATION N/A 03/16/2016   Procedure: IVC Filter Insertion;  Surgeon: Renford Dills, MD;  Location: [facility] INVASIVE CV LAB;  Service: Cardiovascular;  Laterality: N/A;  . SINUS EXPLORATION    . WRIST ARTHROSCOPY Right     Prior to Admission medications   Medication Sig Start Date End Date Taking? Authorizing Provider  abacavir-dolutegravir-lamiVUDine (TRIUMEQ) 600-50-300 MG tablet Take 1 tablet by  mouth daily after breakfast. Reported on 01/30/2016 01/27/16  Yes [provider]  acetaminophen (TYLENOL) 325 MG tablet Take 1 tablet (325 mg total) by mouth every 6 (six) hours as needed for mild pain (or Fever >/= 101). 03/15/17  Yes Gouru, Aruna, MD  albuterol (PROVENTIL HFA;VENTOLIN HFA) 108 (90 BASE) MCG/ACT inhaler Inhale 2 puffs into the lungs every 6 (six) hours as needed for wheezing or shortness of breath.   Yes [provider]  albuterol (PROVENTIL)  (2.5 MG/3ML) 0.083% nebulizer solution Take 2.5 mg by nebulization 3 (three) times daily.    Yes [provider]  atorvastatin (LIPITOR) 10 MG tablet Take 10 mg by mouth daily after breakfast. 11/16/16  Yes [provider]  brimonidine (ALPHAGAN) 0.2 % ophthalmic solution  01/31/17  Yes [provider]  Calcium Carbonate-Vitamin D (CALCIUM 600+D) 600-400 MG-UNIT tablet Take 1 tablet by mouth daily after breakfast.    Yes [provider]  carvedilol (COREG) 12.5 MG tablet Take 12.5 mg by mouth 2 (two) times daily with a meal.   Yes [provider]  cetirizine (ZYRTEC) 10 MG tablet Take 10 mg by mouth daily as needed for allergies.    Yes [provider]  cyclobenzaprine (FLEXERIL) 5 MG tablet Take 1 tablet (5 mg total) by mouth 3 (three) times daily as needed for muscle spasms. 04/04/17  Yes Earna Coder, MD  docusate sodium (COLACE) 50 MG capsule Take 50 mg by mouth daily as needed.    Yes [provider]  EPINEPHrine (EPIPEN 2-PAK) 0.3 mg/0.3 mL IJ SOAJ injection Inject 0.3 mg into the muscle once as needed (for severe allergic reaction).   Yes [provider]  escitalopram (LEXAPRO) 20 MG tablet Take 20 mg by mouth daily after breakfast.    Yes [provider]  fondaparinux (ARIXTRA) 7.5 MG/0.6ML SOLN injection Inject 0.6 mLs (7.5 mg total) into the skin daily. 04/04/17  Yes Earna Coder, MD  furosemide (LASIX) 20 MG tablet Take 20 mg by mouth daily.  10/22/16  Yes [provider]  guaiFENesin (MUCINEX) 600 MG 12 hr tablet Take 1 tablet (600 mg total) by mouth 2 (two) times daily. 03/15/17  Yes Gouru, Deanna Artis, MD  HYDROcodone-acetaminophen (NORCO) 5-325 MG tablet Take 1 tablet by mouth every 6 (six) hours as needed for moderate pain. 04/04/17  Yes Earna Coder, MD  ketorolac (ACULAR) 0.5 % ophthalmic solution 1 drop 4 (four) times daily.   Yes [provider]  levothyroxine (SYNTHROID,  LEVOTHROID) 50 MCG tablet Take 1 tablet (50 mcg total) by mouth daily before breakfast. 03/05/17 05/01/17 Yes Auburn Bilberry, MD  LINZESS 72 MCG capsule Take 72 mcg by mouth daily after breakfast. 11/10/16  Yes [provider]  losartan (COZAAR) 25 MG tablet Take 25 mg by mouth every morning. 03/03/17  Yes [provider]  LYRICA 75 MG capsule Take 75 mg by mouth 2 (two) times daily. 04/22/17  Yes [provider]  magnesium oxide (MAG-OX) 400 MG tablet Take 400 mg by mouth daily after breakfast.   Yes [provider]  meloxicam (MOBIC) 7.5 MG tablet Take 7.5 mg by mouth daily as needed for pain.   Yes [provider]  mirtazapine (REMERON) 15 MG tablet Take 15 mg by mouth daily.    Yes [provider]  mometasone-formoterol (DULERA) 200-5 MCG/ACT AERO Inhale 2 puffs into the lungs 3 (three) times daily.    Yes [provider]  Multiple Vitamin (MULTIVITAMIN WITH  MINERALS) TABS tablet Take 1 tablet by mouth daily. ONE-A-DAY MULTIVITAMIN 50+   Yes [provider]  nitroGLYCERIN (NITROSTAT) 0.4 MG SL tablet Place 0.4 mg under the tongue every 5 (five) minutes as needed for chest pain.   Yes [provider]  ofloxacin (OCUFLOX) 0.3 % ophthalmic solution Place 1 drop into both eyes 4 (four) times daily.  01/31/17  Yes [provider]  omeprazole (PRILOSEC) 40 MG capsule Take 40 mg by mouth daily after breakfast. 11/16/16  Yes [provider]  Potassium Chloride CR (MICRO-K) 8 MEQ CPCR capsule CR Take 1 capsule by mouth daily after breakfast. 11/15/16  Yes [provider]  sennosides-docusate sodium (SENOKOT-S) 8.6-50 MG tablet Take 1 tablet by mouth at bedtime as needed for constipation.   Yes [provider]  tiotropium (SPIRIVA) 18 MCG inhalation capsule Place 18 mcg into inhaler and inhale daily.   Yes [provider]  traMADol (ULTRAM) 50 MG tablet Take by mouth every 6 (six) hours as  needed. Pt taking twice a day   Yes [provider]  zolpidem (AMBIEN) 5 MG tablet Take 5 mg by mouth at bedtime. 10/15/16  Yes [provider]    Allergies  Allergen Reactions  . Aspirin Anaphylaxis  . Bee Venom Anaphylaxis  . Penicillins Anaphylaxis and Other (See Comments)    Has patient had a PCN reaction causing immediate rash, facial/tongue/throat swelling, SOB or lightheadedness with hypotension: Yes Has patient had a PCN reaction causing severe rash involving mucus membranes or skin necrosis: No Has patient had a PCN reaction that required hospitalization No Has patient had a PCN reaction occurring within the last 10 years: Yes If all of the above answers are "NO", then may proceed with Cephalosporin use.  . Prednisone Anaphylaxis  . Sulfa Antibiotics Anaphylaxis  . Sulfasalazine Anaphylaxis  . Theophyllines Anaphylaxis  . Theophylline Swelling    Family History  Problem Relation Age of Onset  . CAD Unknown   . Asthma Mother   . Cirrhosis Father   . Deep vein thrombosis Neg Hx   . Diabetes Neg Hx     Social History Social History  Substance Use Topics  . Smoking status: Former Smoker    Quit date: 12/16/2001  . Smokeless tobacco: Never Used  . Alcohol use No    Review of Systems  Constitutional: Negative for fever. Eyes: Negative for visual changes. ENT: Negative for sore throat. Cardiovascular: Negative for chest pain. Respiratory: Negative for shortness of breath. Gastrointestinal: Negative for abdominal pain, vomiting and diarrhea. Genitourinary: Negative for dysuria. Musculoskeletal: Negative for back pain.  he does have bilateral leg pain which hurts both at the knees but all the way relief from the mid thighs all the way down the leg. Skin: Negative for rash. Neurological: Negative for headache.  ____________________________________________   PHYSICAL EXAM:  VITAL SIGNS: ED Triage Vitals  Enc Vitals Group     BP 05/01/17 1032  (!) 149/72     Pulse Rate 05/01/17 1032 100     Resp 05/01/17 1032 12     Temp 05/01/17 1032 98.1 F (36.7 C)     Temp Source 05/01/17 1032 Oral     SpO2 05/01/17 1032 98 %     Weight 05/01/17 1033 170 lb (77.1 kg)     Height 05/01/17 1033  (1.651 m)     Head Circumference --      Peak Flow --      Pain Score 05/01/17 1026  10     Pain Loc --      Pain Edu? --      Excl. in GC? --      Constitutional: Alert and oriented. Well appearing and in no distress. HEENT   Head: Normocephalic and atraumatic.      Eyes: Conjunctivae are normal. Pupils equal and round.       Ears:         Nose: No congestion/rhinnorhea.   Mouth/Throat: Mucous membranes are moist.   Neck: No stridor. Cardiovascular/Chest: Normal rate, regular rhythm.  No murmurs, rubs, or gallops. Respiratory: Normal respiratory effort without tachypnea nor retractions. Breath sounds are clear and equal bilaterally. No wheezes/rales/rhonchi. Gastrointestinal: Soft. No distention, no guarding, no rebound. Nontender.    Genitourinary/rectal:Deferred Musculoskeletal:pelvis stable to AP and lateral compression. His legs are extremely tender even to light touch at the thighs all the way down to the feet. No significant edema. He does have abrasions over both knees. No deformities. No upper extremity traumatic injury or abnormality. Neurologic:  Normal speech and language. No gross or focal neurologic deficits are appreciated. Skin:  Skin is warm, dry and intact. No rash noted. Psychiatric: Mood and affect are normal. Speech and behavior are normal. Patient exhibits appropriate insight and judgment.   ____________________________________________  LABS (pertinent positives/negatives) I, Governor Rooks, MD the attending physician have reviewed the labs noted below.  Labs Reviewed  CBC WITH DIFFERENTIAL/PLATELET - Abnormal; Notable for the following:       Result Value   WBC 12.3 (*)    RBC 3.65 (*)    Hemoglobin  12.3 (*)    HCT 36.4 (*)    RDW 15.7 (*)    Neutro Abs 10.3 (*)    All other components within normal limits  COMPREHENSIVE METABOLIC PANEL - Abnormal; Notable for the following:    Glucose, Bld 101 (*)    BUN 22 (*)    Creatinine, Ser 1.31 (*)    Calcium 8.6 (*)    Albumin 3.0 (*)    ALT 16 (*)    Alkaline Phosphatase 196 (*)    Total Bilirubin 1.3 (*)    GFR calc non Af Amer 58 (*)    All other components within normal limits  TROPONIN I - Abnormal; Notable for the following:    Troponin I 0.03 (*)    All other components within normal limits  CK  URINALYSIS, COMPLETE (UACMP) WITH MICROSCOPIC    ____________________________________________    EKG I, Governor Rooks, MD, the attending physician have personally viewed and interpreted all ECGs.  97 beats per minute. Normal sinus rhythm. Narrow QRS. Normal axis. Nonspecific ST and T-wave. ____________________________________________  RADIOLOGY All Xrays were viewed by me.  Imaging interpreted by Radiologist, and I, Governor Rooks, MD the attending physician have reviewed the radiologist interpretation noted below.  pelvis one to 2 views: No pelvic fracture  left knee complete and right knee complete x-rays: no acute bony findings.  chest x-ray two-view:  no acute cardio pulmonary findings. No acute bony findings. Chronic bony changes.  __________________________________________  PROCEDURES  Procedure(s) performed: None  Critical Care performed: None  ____________________________________________   ED COURSE / ASSESSMENT AND PLAN  Pertinent labs & imaging results that were available during my care of the patient were reviewed by me and considered in my medical decision making (see chart for details).    Mr. Blew came in for evaluation after falling onto the floor which sounds possibly orthostatic, and then was unable  to get off the floor.  No reported traumatic injury to the head neck chest abdomen or back. He is  complaining of leg pain where he does have abrasions over his knees but then pain which is even sensitive to light touch from the thighs all the way down both legs which sounds more neuropathic in nature.  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  X-rays are overall reassuring.  Laboratory studies are reassuring in terms of causes for patient to be more off balance.  At this point I am going treat him with IV fluids as well as check orthostatics until patient is able to stand up. If he is unable to stand up on his own, he may need acute care rehabilitation.  He does not have focal neurologic deficit in terms of concern for stroke.  patient states that at baseline he does live alone and he has a hospital bed and he has PT that comes to his house and that he walks with a walker. He would like to try some fluids here and if he is able to stand and he would like to go home. I let him know that I'm little concerned about his safety if he falls again is unable to get up. If he is unable to stand or orthostatic after fluids he may need to stay overnight for physical therapy and social worker evaluation for placement options.  Patient care transferred to Dr. Scotty Court at shift change 4 PM. Fluid bolus, reevaluation with orthostatics pending.  DIFFERENTIAL DIAGNOSIS: including but not limited to dehydration, electrolyte disturbance, anemia, infection, stroke, etc.  CONSULTATIONS:   none  Patient / Family / Caregiver informed of clinical course, medical decision-making process, and agree with plan.   ___________________________________________   FINAL CLINICAL IMPRESSION(S) / ED DIAGNOSES   Final diagnoses:  Near syncope  Fall, initial encounter              Note: This dictation was prepared with Office manager. Any transcriptional errors that result from this process are unintentional     Governor Rooks, MD 05/01/17 1558

## 2017-05-01 NOTE — ED Provider Notes (Signed)
Clinical Course as of May 01 2256  Wynelle Link May 01, 2017  2140 Patient still very weak after 2 L of IV fluid. Not able to stand up. Discussed with patient possibility of having social work and physical therapy see him much for acute rehabilitation placement, but he refuses. He wants to go home. He does have home nursing and physical therapy already in place from his vascular surgeon. However, he is not able to get home until morning because his sister has his house key and she is staying with friends in [place]. He has no acute medical needs at this moment, he is stable, symptoms are controlled and vital signs are unremarkable. We will board him in the ED overnight until he can be discharged home in the morning. Does not take any medications at night.  [PS]    Clinical Course User Index [PS] Sharman Cheek, MD     ----------------------------------------- 10:58 PM on 05/01/2017 -----------------------------------------  Remained stable, symptoms stable at chronic baseline. Weakness also a chronic issue for the patient for which she has in home nursing and physical therapy.   Sharman Cheek, MD 05/01/17 2258

## 2017-05-01 NOTE — ED Notes (Signed)
Per Dr Shaune Pollack, after 1 Liter of IV fluids, then orthostatic v/s

## 2017-05-02 ENCOUNTER — Emergency Department: Payer: Medicare Other

## 2017-05-02 ENCOUNTER — Inpatient Hospital Stay
Admission: EM | Admit: 2017-05-02 | Discharge: 2017-05-06 | DRG: 553 | Disposition: A | Discharge status: Skilled Nursing Facility | Payer: Medicare Other | Attending: Internal Medicine | Admitting: Internal Medicine

## 2017-05-02 DIAGNOSIS — R410 Disorientation, unspecified: Secondary | ICD-10-CM | POA: Diagnosis not present

## 2017-05-02 DIAGNOSIS — I252 Old myocardial infarction: Secondary | ICD-10-CM

## 2017-05-02 DIAGNOSIS — I2510 Atherosclerotic heart disease of native coronary artery without angina pectoris: Secondary | ICD-10-CM | POA: Diagnosis present

## 2017-05-02 DIAGNOSIS — M879 Osteonecrosis, unspecified: Secondary | ICD-10-CM | POA: Diagnosis not present

## 2017-05-02 DIAGNOSIS — E785 Hyperlipidemia, unspecified: Secondary | ICD-10-CM | POA: Diagnosis not present

## 2017-05-02 DIAGNOSIS — M25552 Pain in left hip: Secondary | ICD-10-CM

## 2017-05-02 DIAGNOSIS — S2241XA Multiple fractures of ribs, right side, initial encounter for closed fracture: Secondary | ICD-10-CM | POA: Diagnosis present

## 2017-05-02 DIAGNOSIS — Y92531 Health care provider office as the place of occurrence of the external cause: Secondary | ICD-10-CM

## 2017-05-02 DIAGNOSIS — R296 Repeated falls: Secondary | ICD-10-CM | POA: Diagnosis present

## 2017-05-02 DIAGNOSIS — M79605 Pain in left leg: Secondary | ICD-10-CM | POA: Diagnosis not present

## 2017-05-02 DIAGNOSIS — S2239XA Fracture of one rib, unspecified side, initial encounter for closed fracture: Secondary | ICD-10-CM | POA: Diagnosis not present

## 2017-05-02 DIAGNOSIS — A0472 Enterocolitis due to Clostridium difficile, not specified as recurrent: Secondary | ICD-10-CM | POA: Diagnosis present

## 2017-05-02 DIAGNOSIS — I82411 Acute embolism and thrombosis of right femoral vein: Secondary | ICD-10-CM

## 2017-05-02 DIAGNOSIS — I4891 Unspecified atrial fibrillation: Secondary | ICD-10-CM | POA: Diagnosis present

## 2017-05-02 DIAGNOSIS — W19XXXA Unspecified fall, initial encounter: Secondary | ICD-10-CM | POA: Diagnosis present

## 2017-05-02 DIAGNOSIS — I10 Essential (primary) hypertension: Secondary | ICD-10-CM | POA: Diagnosis not present

## 2017-05-02 DIAGNOSIS — R0600 Dyspnea, unspecified: Secondary | ICD-10-CM | POA: Diagnosis not present

## 2017-05-02 DIAGNOSIS — M87052 Idiopathic aseptic necrosis of left femur: Principal | ICD-10-CM | POA: Diagnosis present

## 2017-05-02 DIAGNOSIS — R55 Syncope and collapse: Secondary | ICD-10-CM | POA: Diagnosis not present

## 2017-05-02 DIAGNOSIS — J449 Chronic obstructive pulmonary disease, unspecified: Secondary | ICD-10-CM | POA: Diagnosis present

## 2017-05-02 DIAGNOSIS — S80211A Abrasion, right knee, initial encounter: Secondary | ICD-10-CM | POA: Diagnosis present

## 2017-05-02 DIAGNOSIS — M87051 Idiopathic aseptic necrosis of right femur: Secondary | ICD-10-CM | POA: Diagnosis present

## 2017-05-02 DIAGNOSIS — K219 Gastro-esophageal reflux disease without esophagitis: Secondary | ICD-10-CM | POA: Diagnosis not present

## 2017-05-02 DIAGNOSIS — I2699 Other pulmonary embolism without acute cor pulmonale: Secondary | ICD-10-CM | POA: Diagnosis not present

## 2017-05-02 DIAGNOSIS — Z86718 Personal history of other venous thrombosis and embolism: Secondary | ICD-10-CM

## 2017-05-02 DIAGNOSIS — Z794 Long term (current) use of insulin: Secondary | ICD-10-CM

## 2017-05-02 DIAGNOSIS — Z86711 Personal history of pulmonary embolism: Secondary | ICD-10-CM

## 2017-05-02 DIAGNOSIS — R0782 Intercostal pain: Secondary | ICD-10-CM | POA: Diagnosis not present

## 2017-05-02 DIAGNOSIS — Z7951 Long term (current) use of inhaled steroids: Secondary | ICD-10-CM

## 2017-05-02 DIAGNOSIS — E1151 Type 2 diabetes mellitus with diabetic peripheral angiopathy without gangrene: Secondary | ICD-10-CM | POA: Diagnosis present

## 2017-05-02 DIAGNOSIS — Z87891 Personal history of nicotine dependence: Secondary | ICD-10-CM

## 2017-05-02 DIAGNOSIS — F0280 Dementia in other diseases classified elsewhere without behavioral disturbance: Secondary | ICD-10-CM | POA: Diagnosis present

## 2017-05-02 DIAGNOSIS — B20 Human immunodeficiency virus [HIV] disease: Secondary | ICD-10-CM

## 2017-05-02 LAB — BASIC METABOLIC PANEL
ANION GAP: 12 (ref 5–15)
BUN: 17 mg/dL (ref 6–20)
CHLORIDE: 102 mmol/L (ref 101–111)
CO2: 24 mmol/L (ref 22–32)
Calcium: 8.2 mg/dL — ABNORMAL LOW (ref 8.9–10.3)
Creatinine, Ser: 1.31 mg/dL — ABNORMAL HIGH (ref 0.61–1.24)
GFR calc Af Amer: >60 mL/min (ref >60)
GFR, EST NON AFRICAN AMERICAN: 58 mL/min — AB (ref >60)
GLUCOSE: 113 mg/dL — AB (ref 65–99)
POTASSIUM: 3.1 mmol/L — AB (ref 3.5–5.1)
Sodium: 138 mmol/L (ref 135–145)

## 2017-05-02 LAB — URINALYSIS, COMPLETE (UACMP) WITH MICROSCOPIC
BACTERIA UA: NONE SEEN
BILIRUBIN URINE: NEGATIVE
Glucose, UA: NEGATIVE mg/dL
HGB URINE DIPSTICK: NEGATIVE
Ketones, ur: 5 mg/dL — AB
LEUKOCYTES UA: NEGATIVE
NITRITE: NEGATIVE
PROTEIN: 30 mg/dL — AB
Specific Gravity, Urine: 1.017 (ref 1.005–1.030)
Squamous Epithelial / LPF: NONE SEEN
pH: 5 (ref 5.0–8.0)

## 2017-05-02 LAB — GLUCOSE, CAPILLARY: Glucose-Capillary: 90 mg/dL (ref 65–99)

## 2017-05-02 LAB — TYPE AND SCREEN
ABO/RH(D): A POS
Antibody Screen: NEGATIVE

## 2017-05-02 LAB — HEPATIC FUNCTION PANEL
ALK PHOS: 156 U/L — AB (ref 38–126)
ALT: 17 U/L (ref 17–63)
AST: 22 U/L (ref 15–41)
Albumin: 2.6 g/dL — ABNORMAL LOW (ref 3.5–5.0)
Bilirubin, Direct: 0.2 mg/dL (ref 0.1–0.5)
Indirect Bilirubin: 0.8 mg/dL (ref 0.3–0.9)
TOTAL PROTEIN: 6.5 g/dL (ref 6.5–8.1)
Total Bilirubin: 1 mg/dL (ref 0.3–1.2)

## 2017-05-02 LAB — PROTIME-INR
INR: 1.19
Prothrombin Time: 15 seconds (ref 11.4–15.2)

## 2017-05-02 LAB — CBC
HEMATOCRIT: 32.1 % — AB (ref 40.0–52.0)
HEMOGLOBIN: 10.9 g/dL — AB (ref 13.0–18.0)
MCH: 33.5 pg (ref 26.0–34.0)
MCHC: 34.1 g/dL (ref 32.0–36.0)
MCV: 98.5 fL (ref 80.0–100.0)
Platelets: 304 10*3/uL (ref 150–440)
RBC: 3.26 MIL/uL — ABNORMAL LOW (ref 4.40–5.90)
RDW: 15.7 % — ABNORMAL HIGH (ref 11.5–14.5)
WBC: 9.2 10*3/uL (ref 3.8–10.6)

## 2017-05-02 LAB — LACTIC ACID, PLASMA: Lactic Acid, Venous: 1.1 mmol/L (ref 0.5–1.9)

## 2017-05-02 LAB — AMMONIA: AMMONIA: 10 umol/L (ref 9–35)

## 2017-05-02 MED ORDER — MIRTAZAPINE 15 MG PO TABS
15.0000 mg | ORAL_TABLET | Freq: Every day | ORAL | Status: DC
  Administered 2017-05-02 – 2017-05-05 (×4): 15 mg via ORAL
  Filled 2017-05-02 (×4): qty 1

## 2017-05-02 MED ORDER — CYCLOBENZAPRINE HCL 10 MG PO TABS
5.0000 mg | ORAL_TABLET | Freq: Three times a day (TID) | ORAL | Status: DC | PRN
  Administered 2017-05-05: 5 mg via ORAL
  Filled 2017-05-02: qty 1

## 2017-05-02 MED ORDER — FENTANYL CITRATE (PF) 100 MCG/2ML IJ SOLN
25.0000 ug | INTRAMUSCULAR | Status: AC
  Administered 2017-05-02: 25 ug via INTRAVENOUS
  Filled 2017-05-02: qty 2

## 2017-05-02 MED ORDER — ZOLPIDEM TARTRATE 5 MG PO TABS
5.0000 mg | ORAL_TABLET | Freq: Every day | ORAL | Status: DC
  Administered 2017-05-02 – 2017-05-05 (×4): 5 mg via ORAL
  Filled 2017-05-02 (×4): qty 1

## 2017-05-02 MED ORDER — ALBUTEROL SULFATE (2.5 MG/3ML) 0.083% IN NEBU
2.5000 mg | INHALATION_SOLUTION | Freq: Three times a day (TID) | RESPIRATORY_TRACT | Status: DC
  Administered 2017-05-03 – 2017-05-06 (×11): 2.5 mg via RESPIRATORY_TRACT
  Filled 2017-05-02 (×11): qty 3

## 2017-05-02 MED ORDER — ENOXAPARIN SODIUM 80 MG/0.8ML ~~LOC~~ SOLN: 1.0000 mg/kg | Freq: Two times a day (BID) | SUBCUTANEOUS | Status: DC

## 2017-05-02 MED ORDER — TIOTROPIUM BROMIDE MONOHYDRATE 18 MCG IN CAPS
18.0000 ug | ORAL_CAPSULE | Freq: Every day | RESPIRATORY_TRACT | Status: DC
  Administered 2017-05-03 – 2017-05-06 (×4): 18 ug via RESPIRATORY_TRACT
  Filled 2017-05-02: qty 5

## 2017-05-02 MED ORDER — ESCITALOPRAM OXALATE 20 MG PO TABS
20.0000 mg | ORAL_TABLET | Freq: Every day | ORAL | Status: DC
  Administered 2017-05-03 – 2017-05-06 (×4): 20 mg via ORAL
  Filled 2017-05-02 (×4): qty 1

## 2017-05-02 MED ORDER — MUPIROCIN CALCIUM 2 % EX CREA
TOPICAL_CREAM | Freq: Once | CUTANEOUS | Status: DC
  Filled 2017-05-02: qty 15

## 2017-05-02 MED ORDER — SODIUM CHLORIDE 0.9 % IV BOLUS (SEPSIS)
1000.0000 mL | Freq: Once | INTRAVENOUS | Status: AC
  Administered 2017-05-02: 1000 mL via INTRAVENOUS

## 2017-05-02 MED ORDER — ONDANSETRON 4 MG PO TBDP
4.0000 mg | ORAL_TABLET | Freq: Once | ORAL | Status: AC
  Administered 2017-05-02: 4 mg via ORAL

## 2017-05-02 MED ORDER — AMIODARONE HCL 200 MG PO TABS: 600.0000 mg | ORAL_TABLET | Freq: Every day | ORAL | Status: DC

## 2017-05-02 MED ORDER — ONDANSETRON HCL 4 MG/2ML IJ SOLN: 4.0000 mg | Freq: Four times a day (QID) | INTRAMUSCULAR | Status: DC | PRN

## 2017-05-02 MED ORDER — PREGABALIN 75 MG PO CAPS
75.0000 mg | ORAL_CAPSULE | Freq: Two times a day (BID) | ORAL | Status: DC
  Administered 2017-05-02 – 2017-05-05 (×7): 75 mg via ORAL
  Filled 2017-05-02 (×7): qty 1

## 2017-05-02 MED ORDER — MAGNESIUM OXIDE 400 (241.3 MG) MG PO TABS
400.0000 mg | ORAL_TABLET | Freq: Every day | ORAL | Status: DC
  Administered 2017-05-03 – 2017-05-06 (×4): 400 mg via ORAL
  Filled 2017-05-02 (×4): qty 1

## 2017-05-02 MED ORDER — ENOXAPARIN SODIUM 80 MG/0.8ML ~~LOC~~ SOLN
1.0000 mg/kg | Freq: Once | SUBCUTANEOUS | Status: AC
  Administered 2017-05-02: 19:00:00 75 mg via SUBCUTANEOUS
  Filled 2017-05-02: qty 0.8

## 2017-05-02 MED ORDER — LEVOTHYROXINE SODIUM 50 MCG PO TABS
50.0000 ug | ORAL_TABLET | Freq: Every day | ORAL | Status: DC
  Administered 2017-05-03 – 2017-05-06 (×4): 50 ug via ORAL
  Filled 2017-05-02 (×4): qty 1

## 2017-05-02 MED ORDER — ACETAMINOPHEN 325 MG PO TABS: 650.0000 mg | ORAL_TABLET | Freq: Four times a day (QID) | ORAL | Status: DC | PRN

## 2017-05-02 MED ORDER — CARVEDILOL 12.5 MG PO TABS: 12.5000 mg | ORAL_TABLET | Freq: Two times a day (BID) | ORAL | Status: DC

## 2017-05-02 MED ORDER — POTASSIUM CHLORIDE CRYS ER 20 MEQ PO TBCR
40.0000 meq | EXTENDED_RELEASE_TABLET | Freq: Once | ORAL | Status: AC
  Administered 2017-05-02: 40 meq via ORAL
  Filled 2017-05-02: qty 2

## 2017-05-02 MED ORDER — ATORVASTATIN CALCIUM 10 MG PO TABS
10.0000 mg | ORAL_TABLET | Freq: Every day | ORAL | Status: DC
  Administered 2017-05-03 – 2017-05-05 (×2): 10 mg via ORAL
  Filled 2017-05-02 (×3): qty 1

## 2017-05-02 MED ORDER — ACETAMINOPHEN 650 MG RE SUPP: 650.0000 mg | Freq: Four times a day (QID) | RECTAL | Status: DC | PRN

## 2017-05-02 MED ORDER — TRAMADOL HCL 50 MG PO TABS
50.0000 mg | ORAL_TABLET | Freq: Two times a day (BID) | ORAL | Status: DC
  Administered 2017-05-02 – 2017-05-05 (×6): 50 mg via ORAL
  Filled 2017-05-02 (×5): qty 1

## 2017-05-02 MED ORDER — IPRATROPIUM-ALBUTEROL 0.5-2.5 (3) MG/3ML IN SOLN
3.0000 mL | Freq: Once | RESPIRATORY_TRACT | Status: AC
  Administered 2017-05-02: 3 mL via RESPIRATORY_TRACT
  Filled 2017-05-02: qty 3

## 2017-05-02 MED ORDER — MUPIROCIN 2 % EX OINT
TOPICAL_OINTMENT | Freq: Once | CUTANEOUS | Status: AC
  Administered 2017-05-02: 21:00:00 via TOPICAL
  Filled 2017-05-02: qty 22

## 2017-05-02 MED ORDER — LINACLOTIDE 72 MCG PO CAPS
72.0000 ug | ORAL_CAPSULE | Freq: Every day | ORAL | Status: DC
  Administered 2017-05-03 – 2017-05-06 (×4): 72 ug via ORAL
  Filled 2017-05-02 (×4): qty 1

## 2017-05-02 MED ORDER — INSULIN ASPART 100 UNIT/ML ~~LOC~~ SOLN: 0.0000 [IU] | Freq: Three times a day (TID) | SUBCUTANEOUS | Status: DC

## 2017-05-02 MED ORDER — HYDROCODONE-ACETAMINOPHEN 5-325 MG PO TABS: 1.0000 | ORAL_TABLET | ORAL | Status: DC | PRN

## 2017-05-02 MED ORDER — LOSARTAN POTASSIUM 25 MG PO TABS: 25.0000 mg | ORAL_TABLET | Freq: Every morning | ORAL | Status: DC

## 2017-05-02 MED ORDER — ABACAVIR-DOLUTEGRAVIR-LAMIVUD 600-50-300 MG PO TABS
1.0000 | ORAL_TABLET | Freq: Every day | ORAL | Status: DC
  Administered 2017-05-03 – 2017-05-06 (×4): 1 via ORAL
  Filled 2017-05-02 (×4): qty 1

## 2017-05-02 MED ORDER — ONDANSETRON 4 MG PO TBDP
ORAL_TABLET | ORAL | Status: DC
Start: 2017-05-02 — End: 2017-05-02
  Filled 2017-05-02: qty 1

## 2017-05-02 MED ORDER — MORPHINE SULFATE (PF) 2 MG/ML IV SOLN: 2.0000 mg | INTRAVENOUS | Status: DC | PRN

## 2017-05-02 MED ORDER — FONDAPARINUX SODIUM 7.5 MG/0.6ML ~~LOC~~ SOLN
7.5000 mg | Freq: Every day | SUBCUTANEOUS | Status: DC
  Administered 2017-05-03 – 2017-05-06 (×4): 7.5 mg via SUBCUTANEOUS
  Filled 2017-05-02 (×4): qty 0.6

## 2017-05-02 MED ORDER — PANTOPRAZOLE SODIUM 40 MG PO TBEC
40.0000 mg | DELAYED_RELEASE_TABLET | Freq: Every day | ORAL | Status: DC
  Administered 2017-05-03 – 2017-05-06 (×4): 40 mg via ORAL
  Filled 2017-05-02 (×4): qty 1

## 2017-05-02 MED ORDER — LORAZEPAM 0.5 MG PO TABS
0.5000 mg | ORAL_TABLET | Freq: Once | ORAL | Status: AC
  Administered 2017-05-02: 0.5 mg via ORAL
  Filled 2017-05-02: qty 1

## 2017-05-02 MED ORDER — MOMETASONE FURO-FORMOTEROL FUM 200-5 MCG/ACT IN AERO
2.0000 | INHALATION_SPRAY | Freq: Two times a day (BID) | RESPIRATORY_TRACT | Status: DC
  Administered 2017-05-02 – 2017-05-06 (×8): 2 via RESPIRATORY_TRACT
  Filled 2017-05-02: qty 8.8

## 2017-05-02 MED ORDER — BACITRACIN-NEOMYCIN-POLYMYXIN 400-5-5000 EX OINT
TOPICAL_OINTMENT | Freq: Three times a day (TID) | CUTANEOUS | Status: DC
  Administered 2017-05-02: 17:00:00 via TOPICAL
  Administered 2017-05-02: 1 via TOPICAL
  Administered 2017-05-03: 16:00:00 via TOPICAL
  Administered 2017-05-03 – 2017-05-04 (×2): 1 via TOPICAL
  Administered 2017-05-04: 10:00:00 via TOPICAL
  Administered 2017-05-04 – 2017-05-05 (×2): 1 via TOPICAL
  Administered 2017-05-05: 5 via TOPICAL
  Administered 2017-05-06 (×2): via TOPICAL
  Filled 2017-05-02 (×15): qty 1

## 2017-05-02 MED ORDER — IPRATROPIUM-ALBUTEROL 0.5-2.5 (3) MG/3ML IN SOLN
3.0000 mL | Freq: Once | RESPIRATORY_TRACT | Status: AC
  Administered 2017-05-02: 3 mL via RESPIRATORY_TRACT

## 2017-05-02 MED ORDER — FUROSEMIDE 20 MG PO TABS: 20.0000 mg | ORAL_TABLET | Freq: Every day | ORAL | Status: DC

## 2017-05-02 MED ORDER — FONDAPARINUX SODIUM 7.5 MG/0.6ML ~~LOC~~ SOLN: 7.5000 mg | SUBCUTANEOUS | Status: DC

## 2017-05-02 MED ORDER — ONDANSETRON HCL 4 MG PO TABS: 4.0000 mg | ORAL_TABLET | Freq: Four times a day (QID) | ORAL | Status: DC | PRN

## 2017-05-02 MED ORDER — POLYETHYLENE GLYCOL 3350 17 G PO PACK: 17.0000 g | PACK | Freq: Every day | ORAL | Status: DC | PRN

## 2017-05-02 MED ORDER — POTASSIUM CHLORIDE CRYS ER 10 MEQ PO TBCR
10.0000 meq | EXTENDED_RELEASE_TABLET | Freq: Every day | ORAL | Status: DC
  Administered 2017-05-03 – 2017-05-06 (×4): 10 meq via ORAL
  Filled 2017-05-02 (×4): qty 1

## 2017-05-02 NOTE — ED Triage Notes (Signed)
Pt had near syncopal episode at doctor's office. Pt c/o pain "all over". Pt alert and oriented X4, active, cooperative, pt in NAD. RR even and unlabored, color WNL.

## 2017-05-02 NOTE — H&P (Signed)
[employer] - [place] at [facility]   PATIENT NAME: Brian Hampton    MR#:  [id number]  DATE OF BIRTH:  September 23, 1955  DATE OF ADMISSION:  05/02/2017  PRIMARY CARE PHYSICIAN: Brian Code, MD   REQUESTING/REFERRING PHYSICIAN: Dr. Roxan Hampton  CHIEF COMPLAINT:   Chief Complaint  Patient presents with  . Weakness    HISTORY OF PRESENT ILLNESS:  Brian Hampton  is a 61 y.o. male with a known history of Diabetes, hypertension, HIV, recurrent syncope, falls, hip pain, COPD on Lovenox presents to the emergency room sent in by his cardiologist due to syncopal episode in the office. Patient was in the office getting a stress test and echocardiogram which were normal. Presently complains of pain all over. CT of the pelvis shows hip AVN. Chest x-ray shows acute right sided sixth and seventh rib fractures. No pneumonia and no UTI. Patient is being admitted for recurrent falls, syncope. Also he has had episodes of confusion and memory loss. There is question regarding patient's ability to make his own decisions. APS has consulted. Patient does not want to go to rehabilitation. Wants to move in with his sister but sister is unable to do this.  PAST MEDICAL HISTORY:   Past Medical History:  Diagnosis Date  . Anemia   . Asthma   . Cataracts, bilateral    worse in Rt eye  . Collagen vascular disease (HCC)   . COPD (chronic obstructive pulmonary disease) (HCC)   . Coronary artery disease   . Depression   . DVT (deep venous thrombosis) (HCC)   . Dysrhythmia   . Emphysema/COPD (HCC)   . Environmental and seasonal allergies   . H/O blood clots   . Heart murmur   . HIV (human immunodeficiency virus infection) (HCC)   . Hypertension   . Leaky heart valve    x 3  . Lung mass   . Myocardial infarction (HCC)    in 2000  . Pneumonia    year ago  . Pulmonary emboli (HCC)   . Type 2 diabetes mellitus (HCC)     PAST SURGICAL HISTORY:   Past Surgical History:  Procedure Laterality Date   . ANKLE ARTHROSCOPY    . IVC FILTER INSERTION    . KYPHOPLASTY N/A 12/21/2016   Procedure: KYPHOPLASTY;  Surgeon: Kennedy Bucker, MD;  Location: [facility] ORS;  Service: Orthopedics;  Laterality: N/A;  . PERIPHERAL VASCULAR CATHETERIZATION N/A 03/16/2016   Procedure: IVC Filter Insertion;  Surgeon: Renford Dills, MD;  Location: [facility] INVASIVE CV LAB;  Service: Cardiovascular;  Laterality: N/A;  . SINUS EXPLORATION    . WRIST ARTHROSCOPY Right     SOCIAL HISTORY:   Social History  Substance Use Topics  . Smoking status: Former Smoker    Quit date: 12/16/2001  . Smokeless tobacco: Never Used  . Alcohol use No    FAMILY HISTORY:   Family History  Problem Relation Age of Onset  . CAD Unknown   . Asthma Mother   . Cirrhosis Father   . Deep vein thrombosis Neg Hx   . Diabetes Neg Hx     DRUG ALLERGIES:   Allergies  Allergen Reactions  . Aspirin Anaphylaxis  . Bee Venom Anaphylaxis  . Penicillins Anaphylaxis and Other (See Comments)    Has patient had a PCN reaction causing immediate rash, facial/tongue/throat swelling, SOB or lightheadedness with hypotension: Yes Has patient had a PCN reaction causing severe rash involving mucus membranes or skin necrosis: No Has patient  had a PCN reaction that required hospitalization No Has patient had a PCN reaction occurring within the last 10 years: Yes If all of the above answers are "NO", then may proceed with Cephalosporin use.  . Prednisone Anaphylaxis  . Sulfa Antibiotics Anaphylaxis  . Sulfasalazine Anaphylaxis  . Theophyllines Anaphylaxis  . Theophylline Swelling    REVIEW OF SYSTEMS:   Review of Systems  Constitutional: Positive for malaise/fatigue. Negative for chills and fever.  HENT: Negative for sore throat.   Eyes: Negative for blurred vision, double vision and pain.  Respiratory: Negative for cough, hemoptysis, shortness of breath and wheezing.   Cardiovascular: Positive for chest pain. Negative for palpitations,  orthopnea and leg swelling.  Gastrointestinal: Negative for abdominal pain, constipation, diarrhea, heartburn, nausea and vomiting.  Genitourinary: Negative for dysuria and hematuria.  Musculoskeletal: Positive for back pain, falls and joint pain.  Skin: Negative for rash.  Neurological: Positive for loss of consciousness and weakness. Negative for sensory change, speech change, focal weakness and headaches.  Endo/Heme/Allergies: Does not bruise/bleed easily.  Psychiatric/Behavioral: Positive for memory loss. Negative for depression. The patient is not nervous/anxious.     MEDICATIONS AT HOME:   Prior to Admission medications   Medication Sig Start Date End Date Taking? Authorizing Provider  abacavir-dolutegravir-lamiVUDine (TRIUMEQ) 600-50-300 MG tablet Take 1 tablet by mouth daily after breakfast. Reported on 01/30/2016 01/27/16  Yes [provider]  acetaminophen (TYLENOL) 325 MG tablet Take 1 tablet (325 mg total) by mouth every 6 (six) hours as needed for mild pain (or Fever >/= 101). 03/15/17  Yes Gouru, Aruna, MD  albuterol (PROVENTIL HFA;VENTOLIN HFA) 108 (90 BASE) MCG/ACT inhaler Inhale 2 puffs into the lungs every 6 (six) hours as needed for wheezing or shortness of breath.   Yes [provider]  albuterol (PROVENTIL) (2.5 MG/3ML) 0.083% nebulizer solution Take 2.5 mg by nebulization 3 (three) times daily.    Yes [provider]  amiodarone (PACERONE) 200 MG tablet Take 600 mg by mouth daily. 04/22/17  Yes [provider]  atorvastatin (LIPITOR) 10 MG tablet Take 10 mg by mouth daily after breakfast. 11/16/16  Yes [provider]  Calcium Carbonate-Vitamin D (CALCIUM 600+D) 600-400 MG-UNIT tablet Take 1 tablet by mouth daily after breakfast.    Yes [provider]  carvedilol (COREG) 12.5 MG tablet Take 12.5 mg by mouth 2 (two) times daily with a meal.   Yes [provider]  cetirizine (ZYRTEC) 10 MG tablet Take 10 mg by mouth  daily as needed for allergies.    Yes [provider]  cyclobenzaprine (FLEXERIL) 5 MG tablet Take 1 tablet (5 mg total) by mouth 3 (three) times daily as needed for muscle spasms. 04/04/17  Yes Earna Coder, MD  docusate sodium (COLACE) 50 MG capsule Take 50 mg by mouth daily as needed for mild constipation.    Yes [provider]  EPINEPHrine (EPIPEN 2-PAK) 0.3 mg/0.3 mL IJ SOAJ injection Inject 0.3 mg into the muscle once as needed. For anaphylaxis   Yes [provider]  escitalopram (LEXAPRO) 20 MG tablet Take 20 mg by mouth daily after breakfast.    Yes [provider]  fondaparinux (ARIXTRA) 7.5 MG/0.6ML SOLN injection Inject 0.6 mLs (7.5 mg total) into the skin daily. 04/04/17  Yes Earna Coder, MD  furosemide (LASIX) 20 MG tablet Take 20 mg by mouth daily.  10/22/16  Yes [provider]  HYDROcodone-acetaminophen (NORCO) 5-325 MG tablet Take 1 tablet by mouth every 6 (  six) hours as needed for moderate pain. 04/04/17  Yes Earna Coder, MD  levothyroxine (SYNTHROID, LEVOTHROID) 50 MCG tablet Take 50 mcg by mouth daily before breakfast.   Yes [provider]  linaclotide (LINZESS) 72 MCG capsule Take 72 mcg by mouth daily after breakfast.   Yes [provider]  losartan (COZAAR) 25 MG tablet Take 25 mg by mouth every morning. 03/03/17  Yes [provider]  Magnesium Oxide 500 MG TABS Take 500 mg by mouth daily after breakfast.   Yes [provider]  meloxicam (MOBIC) 7.5 MG tablet Take 7.5 mg by mouth daily as needed for pain.   Yes [provider]  mirtazapine (REMERON) 15 MG tablet Take 15 mg by mouth at bedtime.    Yes [provider]  mometasone-formoterol (DULERA) 200-5 MCG/ACT AERO Inhale 2 puffs into the lungs 2 (two) times daily.    Yes [provider]  Multiple Vitamin (MULTIVITAMIN WITH MINERALS) TABS tablet Take 1 tablet by mouth daily.    Yes [provider]  nitroGLYCERIN (NITROSTAT) 0.4 MG SL tablet Place 0.4 mg under the tongue every 5 (five) minutes as needed for chest pain.   Yes [provider]  omeprazole (PRILOSEC) 40 MG capsule Take 40 mg by mouth daily after breakfast. 11/16/16  Yes [provider]  Potassium Chloride CR (MICRO-K) 8 MEQ CPCR capsule CR Take 8 mEq by mouth daily after breakfast.    Yes [provider]  pregabalin (LYRICA) 75 MG capsule Take 75 mg by mouth 2 (two) times daily.   Yes [provider]  sennosides-docusate sodium (SENOKOT-S) 8.6-50 MG tablet Take 1 tablet by mouth at bedtime as needed for constipation.   Yes [provider]  tiotropium (SPIRIVA) 18 MCG inhalation capsule Place 18 mcg into inhaler and inhale daily.   Yes [provider]  traMADol (ULTRAM) 50 MG tablet Take 50 mg by mouth 2 (two) times daily.    Yes [provider]  zolpidem (AMBIEN) 5 MG tablet Take 5 mg by mouth at bedtime. 10/15/16  Yes [provider]     VITAL SIGNS:  Blood pressure 115/75, pulse (!) 105, temperature 97.9 F (36.6 C), temperature source Oral, resp. rate (!) 26, height  (1.651 m), weight 77.1 kg (170 lb), SpO2 98 %.  PHYSICAL EXAMINATION:  Physical Exam  GENERAL:  61 y.o.-year-old patient lying in the bed with no acute distress.  EYES: Pupils equal, round, reactive to light and accommodation. No scleral icterus. Extraocular muscles intact.  HEENT: Head atraumatic, normocephalic. Oropharynx and nasopharynx clear. No oropharyngeal erythema, moist oral mucosa  NECK:  Supple, no jugular venous distention. No thyroid enlargement, no tenderness.  LUNGS: Normal breath sounds bilaterally, no wheezing, rales, rhonchi. No use of accessory muscles of respiration.  CARDIOVASCULAR: S1, S2 normal. No murmurs, rubs, or gallops.  ABDOMEN: Soft, nontender, nondistended. Bowel sounds present. No organomegaly or mass.  EXTREMITIES: No pedal edema,  cyanosis, or clubbing. + 2 pedal & radial pulses b/l.   NEUROLOGIC: Cranial nerves II through XII are intact. No focal Motor or sensory deficits appreciated b/l PSYCHIATRIC: The patient is alert and awake. Oriented to place but not time SKIN: Multiple areas of bruising and skin excoriations from falls  LABORATORY PANEL:   CBC  Recent Labs Lab 05/02/17 1415  WBC 9.2  HGB 10.9*  HCT 32.1*  PLT 304   ------------------------------------------------------------------------------------------------------------------  Chemistries   Recent Labs Lab 05/02/17 1415  NA 138  K  3.1*  CL 102  CO2 24  GLUCOSE 113*  BUN 17  CREATININE 1.31*  CALCIUM 8.2*  AST 22  ALT 17  ALKPHOS 156*  BILITOT 1.0   ------------------------------------------------------------------------------------------------------------------  Cardiac Enzymes  Recent Labs Lab 05/01/17 1848  TROPONINI <0.03   ------------------------------------------------------------------------------------------------------------------  RADIOLOGY:  Dg Chest 1 View  Result Date: 05/02/2017 CLINICAL DATA:  Near syncopal episode EXAM: CHEST 1 VIEW COMPARISON:  05/01/2017, 04/26/2017, CT chest 03/30/2017 FINDINGS: Linear atelectasis or scar at the left base. No consolidation or effusion. Normal cardiomediastinal silhouette. No pneumothorax. Old left fifth rib fracture. Possible acute right sixth and seventh rib fractures. IMPRESSION: 1. Negative for pleural effusion or pneumothorax 2. Probable acute right sixth and seventh rib fractures Electronically Signed   By: Jasmine Pang M.D.   On: 05/02/2017 16:11   Dg Chest 2 View  Result Date: 05/01/2017 CLINICAL DATA:  Larey Seat last evening. EXAM: CHEST  2 VIEW COMPARISON:  04/26/2017 FINDINGS: The cardiac silhouette, mediastinal and hilar contours are within normal limits and stable. Mild tortuosity of the thoracic aorta. The lungs are clear of an acute process. No pleural effusions or  pneumothorax. The bony thorax is intact. Remote healed rib fractures are noted bilaterally. Remote vertebral augmentation changes in the midthoracic spine and remote compression deformities. IMPRESSION: No acute cardiopulmonary findings. No acute bony findings.  Chronic bony changes. Electronically Signed   By: Rudie Meyer M.D.   On: 05/01/2017 12:51   Dg Pelvis 1-2 Views  Result Date: 05/01/2017 CLINICAL DATA:  Per EMS pt comes from home (a boarding house) and fell last night 8:00. Pt was unable to get up and called EMS this morning for help. Pt has abrasions on both knees and states generalized pain Hx/o asthma,COPD, DVT, CAD, HIV,.*comment was truncated* EXAM: PELVIS - 1-2 VIEW COMPARISON:  None. FINDINGS: There is no evidence of pelvic fracture or diastasis. No pelvic bone lesions are seen. IMPRESSION: No pelvic fracture. Electronically Signed   By: Genevive Bi M.D.   On: 05/01/2017 12:49   Ct Head Wo Contrast  Result Date: 05/02/2017 CLINICAL DATA:  61 year old male with near syncopal event at doctor's office today. EXAM: CT HEAD WITHOUT CONTRAST TECHNIQUE: Contiguous axial images were obtained from the base of the skull through the vertex without intravenous contrast. COMPARISON:  Head CT 04/26/2017. FINDINGS: Brain: Patchy and confluent areas of decreased attenuation are noted throughout the deep and periventricular white matter of the cerebral hemispheres bilaterally, compatible with chronic microvascular ischemic disease. No evidence of acute infarction, hemorrhage, hydrocephalus, extra-axial collection or mass lesion/mass effect. Vascular: No hyperdense vessel or unexpected calcification. Skull: Lucent area in the right frontal bone stable compared to numerous prior examinations dating back to 01/29/2016, nonspecific but presumably benign (potentially an area of fibrous dysplasia). Negative for fracture. Sinuses/Orbits: No acute finding. Other: None. IMPRESSION: 1. No acute intracranial  abnormalities. 2. Chronic microvascular ischemic changes in the cerebral white matter redemonstrated, as above. Electronically Signed   By: Trudie Reed M.D.   On: 05/02/2017 15:43   Ct Pelvis Wo Contrast  Result Date: 05/02/2017 CLINICAL DATA:  Patient fell last evening. Generalized as well as left hip pain. EXAM: CT PELVIS WITHOUT CONTRAST TECHNIQUE: Multidetector CT imaging of the pelvis was performed following the standard protocol without intravenous contrast. COMPARISON:  05/01/2017 radiographs, CT 03/04/2017 FINDINGS: Urinary Tract:  Physiologically distended urinary bladder. Bowel: Moderate fecal residue within the rectal vault. Mild rectal wall thickening surrounding the stool ball is noted which may reflect a mild  stercoral colitis. Vascular/Lymphatic: Minimal atherosclerosis of the common iliac arteries. No aneurysm. No lymphadenopathy. Reproductive:  No mass or other significant abnormality Other:  None. Musculoskeletal: Slight flattening of the weight-bearing portions of both femoral heads consistent with changes of AVN. Axial joint space narrowing is identified of both hips reveal bone island is noted of the right femoral neck. No acute displaced fracture or joint effusion. No intramuscular hemorrhage or significant muscle atrophy. IMPRESSION: 1. Subtle flattening along the weight-bearing portions of both femoral heads consistent with changes of AVN. 2. No acute displaced appearing fracture or joint dislocation. 3. Moderate fecal residue within the included colon and rectum. Mild rectal wall thickening may be secondary to a a stercoral colitis. Electronically Signed   By: Tollie Eth M.D.   On: 05/02/2017 18:55   Dg Knee Complete 4 Views Left  Result Date: 05/01/2017 CLINICAL DATA:  Larey Seat last evening.  Left knee pain. EXAM: LEFT KNEE - COMPLETE 4+ VIEW COMPARISON:  04/26/2017 FINDINGS: The joint spaces are maintained. No acute fracture or osteochondral lesion. No joint effusion. IMPRESSION:  No acute bony findings. Electronically Signed   By: Rudie Meyer M.D.   On: 05/01/2017 12:49   Dg Knee Complete 4 Views Right  Result Date: 05/01/2017 CLINICAL DATA:  Larey Seat last evening.  Right knee pain. EXAM: RIGHT KNEE - COMPLETE 4+ VIEW COMPARISON:  04/26/2017 FINDINGS: The joint spaces are maintained. No acute fracture, osteochondral lesion or joint effusion. IMPRESSION: No acute bony findings. Electronically Signed   By: Rudie Meyer M.D.   On: 05/01/2017 12:52   Dg Hip Unilat W Or Wo Pelvis 2-3 Views Left  Result Date: 05/02/2017 CLINICAL DATA:  Hip pain after syncopal episode EXAM: DG HIP (WITH OR WITHOUT PELVIS) 2-3V LEFT COMPARISON:  04/24/2017 FINDINGS: No fracture or malalignment. Pubic symphysis is intact. Slight flattening and subtle sclerosis at the left greater than right femoral heads. Mild degenerative changes of both hips. IMPRESSION: 1. No acute osseous abnormality 2. Slight flattening and subtle sclerosis in the left greater than right femoral heads suggestive of mild AVN. Electronically Signed   By: Jasmine Pang M.D.   On: 05/02/2017 16:12     IMPRESSION AND PLAN:   * Syncope Etiology unclear. Patient had stress test and echocardiogram earlier today at his cardiologist's office which were normal. Patient will be admitted to medical floor with telemetry monitoring. Consult cardiology Dr. Welton Flakes for further input for recurrent syncope.  * Acute right sixth and seventh rib fractures. Pain control. Incentive spirometer.  * Mild cognitive impairment. Patient has been noticed to have memory gaps along with periods of disorientation. [facility] consult psychiatry for competency. APS involved in patient's care from the emergency room. Social work consult.  * Hip avascular necrosis. Consult orthopedics. May need surgery due to recurrent falls and pain.  * Diabetes mellitus. Continue home medications and sliding scale insulin.  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 Continue home  dose.  * HIV. Continue home medications.  * Dementia. Consult psychiatry for competency.  All the records are reviewed and case discussed with ED provider. Management plans discussed with the patient, family and they are in agreement.  Hampton STATUS: FULL Hampton  TOTAL TIME TAKING CARE OF THIS PATIENT: 40 minutes.   Milagros Loll R M.D on 05/02/2017 at 8:37 PM  Between 7am to 6pm - Pager - [phone]  After 6pm go to [email or web address] - password [email or web address] [facility]  [facility] [place] [facility]  Office  [phone]  CC: Primary care  physician; Brian Code, MD  Note: This dictation was prepared with Dragon dictation along with smaller phrase technology. Any transcriptional errors that result from this process are unintentional.

## 2017-05-02 NOTE — Progress Notes (Addendum)
ANTICOAGULATION CONSULT NOTE - Initial Consult  Pharmacy Consult for Lovenox Indication: PE  Allergies  Allergen Reactions  . Aspirin Anaphylaxis  . Bee Venom Anaphylaxis  . Penicillins Anaphylaxis and Other (See Comments)    Has patient had a PCN reaction causing immediate rash, facial/tongue/throat swelling, SOB or lightheadedness with hypotension: Yes Has patient had a PCN reaction causing severe rash involving mucus membranes or skin necrosis: No Has patient had a PCN reaction that required hospitalization No Has patient had a PCN reaction occurring within the last 10 years: Yes If all of the above answers are "NO", then may proceed with Cephalosporin use.  . Prednisone Anaphylaxis  . Sulfa Antibiotics Anaphylaxis  . Sulfasalazine Anaphylaxis  . Theophyllines Anaphylaxis  . Theophylline Swelling    Patient Measurements: Height:  (165.1 cm) Weight: 170 lb (77.1 kg) IBW/kg (Calculated) : 61.5 Heparin Dosing Weight:   Vital Signs: Temp: 97.9 F (36.6 C) (09/24 1412) Temp Source: Oral (09/24 1412) BP: 136/81 (09/24 1700) Pulse Rate: 103 (09/24 1500)  Labs:  Recent Labs  05/01/17 1149 05/01/17 1248 05/01/17 1848 05/02/17 1415  HGB 12.3*  --   --  10.9*  HCT 36.4*  --   --  32.1*  PLT 301  --   --  304  CREATININE  --  1.31*  --  1.31*  CKTOTAL  --  319  --   --   TROPONINI  --  0.03* <0.03  --     Estimated Creatinine Clearance: 57.4 mL/min (A) (by C-G formula based on SCr of 1.31 mg/dL (H)).   Medical History: Past Medical History:  Diagnosis Date  . Anemia   . Asthma   . Cataracts, bilateral    worse in Rt eye  . Collagen vascular disease (HCC)   . COPD (chronic obstructive pulmonary disease) (HCC)   . Coronary artery disease   . Depression   . DVT (deep venous thrombosis) (HCC)   . Dysrhythmia   . Emphysema/COPD (HCC)   . Environmental and seasonal allergies   . H/O blood clots   . Heart murmur   . HIV (human immunodeficiency virus  infection) (HCC)   . Hypertension   . Leaky heart valve    x 3  . Lung mass   . Myocardial infarction (HCC)    in 2000  . Pneumonia    year ago  . Pulmonary emboli (HCC)   . Type 2 diabetes mellitus (HCC)     Medications:  Infusions:    Assessment: 61 yom cc weakness with PMH COPD, HIV, was discharged just this AM. Pharmacy consulted to dose LMWH for PE. Arixtra noted on PTA list - RN Judeth Cornfield guarantees me patient did not receive this today.   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 Dr. Donneta Romberg notes (04/04/2017) that patient was on Eliquis summer of 2017 but had LLL subsegemntal PE so they stopped Eliquis and started Coumadin. In July 2018 patient had DVT RLE so stopped Coumadin and started Lovenox. In July 2018 patient had worsening LE DVT while on LMWH so they started Arixtra 7.5 mg subcutaneously once daily. The patient developed right hand swelling/hematoma on Arixtra but no changes were made to his anticoagulation. Dr. Donneta Romberg continued Arixtra 7.5 mg subcutaneously once daily with no dose reduction due to difficulty with anticoagulation.   Goal of Therapy:  Anti-Xa level 0.6-1 units/ml 4hrs after LMWH dose given Monitor platelets by anticoagulation protocol: Yes   Plan:  Lovenox 1 mg/kg subcutaneously BID -  consider switching to his PTA Arixtra 7.5 mg subcutaneously once daily while IP.   9/24 20:22 Dr. Roxan Hockey gives verbal consent to switch to Arixtra 7.5 mg subcutaneously once daily.   [name], Pharm.D., BCPS Clinical Pharmacist 05/02/2017,6:15 PM

## 2017-05-02 NOTE — ED Provider Notes (Signed)
[facility] Emergency Department Provider Note    First MD Initiated Contact with Patient 05/02/17 1501     (approximate)  I have reviewed the triage vital signs and the nursing notes.   HISTORY  Chief Complaint Weakness    HPI Brian Hampton is a 61 y.o. male with a history of COPD HIV and COPD presents back to the ER shortly after being discharged from the ER with. Patient was here yesterday and overnight with chief complaint of weakness. Multiple providers had encouraged the patient to stay for admission due to persistent weakness the patient refused. He then went to his cardiologist's appointment this morning where he had 2 near syncopal episodes. He states that Dr. Rickey Primus saw something abnormal on his EKG and also noted guaiac positive stools and he was sent back to the ER. No fevers. States that he is feeling more weak and tremulous. Denies any history of alcohol abuse. States he does have a history of cirrhosis.  Although I do not see this document his medical records. At this point patient is agreeable to admission to hospital. No change in discomfort or complaints. Denies any chest pain or abdominal pain at this time. No nausea or vomiting.   Echo 05/02/17 - Per Dr. Park Breed EF 76%  Mild tV and MV regurg Stress test this AM was unremarkable, small mild fixed inferior wall defect.    Past Medical History:  Diagnosis Date  . Anemia   . Asthma   . Cataracts, bilateral    worse in Rt eye  . Collagen vascular disease (HCC)   . COPD (chronic obstructive pulmonary disease) (HCC)   . Coronary artery disease   . Depression   . DVT (deep venous thrombosis) (HCC)   . Dysrhythmia   . Emphysema/COPD (HCC)   . Environmental and seasonal allergies   . H/O blood clots   . Heart murmur   . HIV (human immunodeficiency virus infection) (HCC)   . Hypertension   . Leaky heart valve    x 3  . Lung mass   . Myocardial infarction (HCC)    in 2000  . Pneumonia    year ago  . Pulmonary emboli (HCC)   . Type 2 diabetes mellitus (HCC)    Family History  Problem Relation Age of Onset  . CAD Unknown   . Asthma Mother   . Cirrhosis Father   . Deep vein thrombosis Neg Hx   . Diabetes Neg Hx    Past Surgical History:  Procedure Laterality Date  . ANKLE ARTHROSCOPY    . IVC FILTER INSERTION    . KYPHOPLASTY N/A 12/21/2016   Procedure: KYPHOPLASTY;  Surgeon: Kennedy Bucker, MD;  Location: [facility] ORS;  Service: Orthopedics;  Laterality: N/A;  . PERIPHERAL VASCULAR CATHETERIZATION N/A 03/16/2016   Procedure: IVC Filter Insertion;  Surgeon: Renford Dills, MD;  Location: [facility] INVASIVE CV LAB;  Service: Cardiovascular;  Laterality: N/A;  . SINUS EXPLORATION    . WRIST ARTHROSCOPY Right    Patient Active Problem List   Diagnosis Date Noted  . Acute deep vein thrombosis (DVT) of femoral vein of right lower extremity (HCC) 04/04/2017  . Left leg pain 03/22/2017  . DVT (deep venous thrombosis) (HCC) 03/12/2017  . Acute deep vein thrombosis (DVT) of right lower extremity (HCC) 02/25/2017  . Postoperative pain   . COPD exacerbation (HCC) 12/23/2016  . Abdominal pain   . Compression fracture of body of thoracic vertebra (HCC) 12/21/2016  .  Hypomagnesemia 05/05/2016  . Rhabdomyolysis 05/05/2016  . Acute pulmonary embolism (HCC) 03/17/2016  . Right leg DVT (HCC) 03/17/2016  . Hypokalemia 03/17/2016  . S/P IVC filter 03/17/2016  . SOB (shortness of breath) 03/15/2016  . Dysphagia 02/02/2016  . GERD (gastroesophageal reflux disease) 02/02/2016  . Hyperthyroidism 01/31/2016  . 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 Elevated transaminase level 01/31/2016  . Anemia 01/31/2016  . Thrombocytopenia (HCC) 01/31/2016  . Pyuria 01/31/2016  . Weakness 01/29/2016  . HIV (human immunodeficiency virus infection) (HCC) 01/29/2016  . CAD (coronary artery disease) 01/29/2016  . COPD (chronic obstructive pulmonary disease) (HCC) 01/29/2016  . Diabetes mellitus (HCC)  01/29/2016  . Leg weakness, bilateral 01/13/2016  . Chest pain 06/24/2015      Prior to Admission medications   Medication Sig Start Date End Date Taking? Authorizing Provider  abacavir-dolutegravir-lamiVUDine (TRIUMEQ) 600-50-300 MG tablet Take 1 tablet by mouth daily after breakfast. Reported on 01/30/2016 01/27/16   [provider]  acetaminophen (TYLENOL) 325 MG tablet Take 1 tablet (325 mg total) by mouth every 6 (six) hours as needed for mild pain (or Fever >/= 101). 03/15/17   Gouru, Aruna, MD  albuterol (PROVENTIL HFA;VENTOLIN HFA) 108 (90 BASE) MCG/ACT inhaler Inhale 2 puffs into the lungs every 6 (six) hours as needed for wheezing or shortness of breath.    [provider]  albuterol (PROVENTIL) (2.5 MG/3ML) 0.083% nebulizer solution Take 2.5 mg by nebulization 3 (three) times daily.     [provider]  atorvastatin (LIPITOR) 10 MG tablet Take 10 mg by mouth daily after breakfast. 11/16/16   [provider]  brimonidine (ALPHAGAN) 0.2 % ophthalmic solution  01/31/17   [provider]  Calcium Carbonate-Vitamin D (CALCIUM 600+D) 600-400 MG-UNIT tablet Take 1 tablet by mouth daily after breakfast.     [provider]  carvedilol (COREG) 12.5 MG tablet Take 12.5 mg by mouth 2 (two) times daily with a meal.    [provider]  cetirizine (ZYRTEC) 10 MG tablet Take 10 mg by mouth daily as needed for allergies.     [provider]  cyclobenzaprine (FLEXERIL) 5 MG tablet Take 1 tablet (5 mg total) by mouth 3 (three) times daily as needed for muscle spasms. 04/04/17   Earna Coder, MD  docusate sodium (COLACE) 50 MG capsule Take 50 mg by mouth daily as needed.     [provider]  EPINEPHrine (EPIPEN 2-PAK) 0.3 mg/0.3 mL IJ SOAJ injection Inject 0.3 mg into the muscle once as needed (for severe allergic reaction).    [provider]  escitalopram (LEXAPRO) 20 MG tablet Take 20 mg by mouth daily after  breakfast.     [provider]  fondaparinux (ARIXTRA) 7.5 MG/0.6ML SOLN injection Inject 0.6 mLs (7.5 mg total) into the skin daily. 04/04/17   Earna Coder, MD  furosemide (LASIX) 20 MG tablet Take 20 mg by mouth daily.  10/22/16   [provider]  guaiFENesin (MUCINEX) 600 MG 12 hr tablet Take 1 tablet (600 mg total) by mouth 2 (two) times daily. 03/15/17   Ramonita Lab, MD  HYDROcodone-acetaminophen (NORCO) 5-325 MG tablet Take 1 tablet by mouth every 6 (six) hours as needed for moderate pain. 04/04/17   Earna Coder, MD  ketorolac (ACULAR) 0.5 % ophthalmic solution 1 drop 4 (four) times daily.    [provider]  levothyroxine (SYNTHROID, LEVOTHROID) 50 MCG tablet Take 1 tablet (50 mcg total) by mouth daily before breakfast. 03/05/17 05/01/17  Auburn Bilberry, MD  LINZESS 72 MCG capsule Take 72 mcg by mouth daily after breakfast. 11/10/16   [provider]  losartan (COZAAR) 25 MG tablet Take 25 mg by mouth every morning. 03/03/17   [provider]  LYRICA 75 MG capsule Take 75 mg by mouth 2 (two) times daily. 04/22/17   [provider]  magnesium oxide (MAG-OX) 400 MG tablet Take 400 mg by mouth daily after breakfast.    [provider]  meloxicam (MOBIC) 7.5 MG tablet Take 7.5 mg by mouth daily as needed for pain.    [provider]  mirtazapine (REMERON) 15 MG tablet Take 15 mg by mouth daily.     [provider]  mometasone-formoterol (DULERA) 200-5 MCG/ACT AERO Inhale 2 puffs into the lungs 3 (three) times daily.     [provider]  Multiple Vitamin (MULTIVITAMIN WITH MINERALS) TABS tablet Take 1 tablet by mouth daily. ONE-A-DAY MULTIVITAMIN 50+    [provider]  nitroGLYCERIN (NITROSTAT) 0.4 MG SL tablet Place 0.4 mg under the tongue every 5 (five) minutes as needed for chest pain.    [provider]  ofloxacin (OCUFLOX) 0.3 % ophthalmic solution Place 1 drop into both eyes  4 (four) times daily.  01/31/17   [provider]  omeprazole (PRILOSEC) 40 MG capsule Take 40 mg by mouth daily after breakfast. 11/16/16   [provider]  Potassium Chloride CR (MICRO-K) 8 MEQ CPCR capsule CR Take 1 capsule by mouth daily after breakfast. 11/15/16   [provider]  sennosides-docusate sodium (SENOKOT-S) 8.6-50 MG tablet Take 1 tablet by mouth at bedtime as needed for constipation.    [provider]  tiotropium (SPIRIVA) 18 MCG inhalation capsule Place 18 mcg into inhaler and inhale daily.    [provider]  traMADol (ULTRAM) 50 MG tablet Take by mouth every 6 (six) hours as needed. Pt taking twice a day    [provider]  zolpidem (AMBIEN) 5 MG tablet Take 5 mg by mouth at bedtime. 10/15/16   [provider]    Allergies Aspirin; Bee venom; Penicillins; Prednisone; Sulfa antibiotics; Sulfasalazine; Theophyllines; and Theophylline    Social History Social History  Substance Use Topics  . Smoking status: Former Smoker    Quit date: 12/16/2001  . Smokeless tobacco: Never Used  . Alcohol use No    Review of Systems Patient denies headaches, rhinorrhea, blurry vision, numbness, shortness of breath, chest pain, edema, cough, abdominal pain, nausea, vomiting, diarrhea, dysuria, fevers, rashes or hallucinations unless otherwise stated above in HPI. ____________________________________________   PHYSICAL EXAM:  VITAL SIGNS: Vitals:   05/02/17 1412  BP: (!) 153/94  Pulse: (!) 113  Resp: (!) 22  Temp: 97.9 F (36.6 C)  SpO2: 97%    Constitutional: Alert oriented to person and place, states Brian Hampton is current president. Chronically ill appearing Eyes: Conjunctivae are normal.  Head: Atraumatic. Nose: No congestion/rhinnorhea. Mouth/Throat: Mucous membranes are moist.   Neck: No stridor. Painless ROM.  Cardiovascular: Normal rate, regular rhythm. Grossly normal heart sounds.  Good peripheral  circulation. Respiratory: Normal respiratory effort.  No retractions. Lungs with coarse bibasilar breathsounds Gastrointestinal: Soft and nontender. + distention. No abdominal bruits. No CVA tenderness. Genitourinary: faintly positive guaiac, no melena or hematochezia Musculoskeletal: No lower extremity tenderness nor edema.  No joint effusions. Neurologic:  + asterixis, diffuse tremor, MAE spontaneously, no facial droop Skin:  Skin is warm, dry and intact. No rash noted. Psychiatric: Mood and affect are  normal. Speech and behavior are normal.  ____________________________________________   LABS (all labs ordered are listed, but only abnormal results are displayed)  Results for orders placed or performed during the hospital encounter of 05/02/17 (from the past 24 hour(s))  Basic metabolic panel     Status: Abnormal   Collection Time: 05/02/17  2:15 PM  Result Value Ref Range   Sodium 138 135 - 145 mmol/L   Potassium 3.1 (L) 3.5 - 5.1 mmol/L   Chloride 102 101 - 111 mmol/L   CO2 24 22 - 32 mmol/L   Glucose, Bld 113 (H) 65 - 99 mg/dL   BUN 17 6 - 20 mg/dL   Creatinine, Ser 1.61 (H) 0.61 - 1.24 mg/dL   Calcium 8.2 (L) 8.9 - 10.3 mg/dL   GFR calc non Af Amer 58 (L) >60 mL/min   GFR calc Af Amer >60 >60 mL/min   Anion gap 12 5 - 15  CBC     Status: Abnormal   Collection Time: 05/02/17  2:15 PM  Result Value Ref Range   WBC 9.2 3.8 - 10.6 K/uL   RBC 3.26 (L) 4.40 - 5.90 MIL/uL   Hemoglobin 10.9 (L) 13.0 - 18.0 g/dL   HCT 09.6 (L) 04.5 - 40.9 %   MCV 98.5 80.0 - 100.0 fL   MCH 33.5 26.0 - 34.0 pg   MCHC 34.1 32.0 - 36.0 g/dL   RDW 81.1 (H) 91.4 - 78.2 %   Platelets 304 150 - 440 K/uL   ____________________________________________  EKG My review and personal interpretation at Time: 14:18   Indication: syncope  Rate: 110  Rhythm: sinus Axis: normal Other: non specific st changes, no stemi, borderling prolonged qt ____________________________________________  RADIOLOGY  I  personally reviewed all radiographic images ordered to evaluate for the above acute complaints and reviewed radiology reports and findings.  These findings were personally discussed with the patient.  Please see medical record for radiology report.  ____________________________________________   PROCEDURES  Procedure(s) performed:  Procedures    Critical Care performed: no ____________________________________________   INITIAL IMPRESSION / ASSESSMENT AND PLAN / ED COURSE  Pertinent labs & imaging results that were available during my care of the patient were reviewed by me and considered in my medical decision making (see chart for details).  DDX: Dehydration, sepsis, pna, uti, hypoglycemia, cva, drug effect, withdrawal, encephalitis   Brian Hampton is a 61 y.o. who presents to the ED with weakness, near syncopal episode and confusion as described above. Patient does have asterixis and states that he has a history of cirrhosis though see no documentation of this in the past. Will obtain lab work for the above differential. He is currently afebrile. Does have persistent tachycardia. Does have a history of a PE and is on Lovenox therefore as he does not have any hypoxia do not believe this consistent with PE. CT imaging ordered to evaluate for CVA or IPH shows no acute abnormalities. Patient with multiple frequent visits to the ER including 11 visits in the past 6 months and 5 admissions to hospital. Refusing admission to hospital or long-term or even short term care facility over the past 24 hours. Patient currently thinks that the Pres. is pres. Brian Hampton.  I have contacted social work to evaluate patient for need for guardianship.  The patient will be placed on continuous pulse oximetry and telemetry for monitoring.  Laboratory evaluation will be sent to evaluate for the above complaints.     Clinical Course as  of May 02 2116  Mon May 02, 2017  1610 CT imaging does show evidence of AVM  without any evidence of fracture. Blood work at this point is roughly at baseline. Patient did have additional episodes of watery diarrhea. We'll sent for C. difficile.  Consult to [facility]PS has been placed.  [PR]  2016 Spoke with Dr. Rosita Kea of orthopedics regarding the patient's findings on imaging.  While this is not an emergent procedure states that patient would likely require operative repair due to pain once medically clear. Spoke with hospitalist, who kindly agrees to admit patient for further medical evaluation as well as pain control with orthopedic consult.  [PR]    Clinical Course User Index [PR] Willy Eddy, MD     ____________________________________________   FINAL CLINICAL IMPRESSION(S) / ED DIAGNOSES  Final diagnoses:  Acute hip pain, left  Avascular necrosis of bone of hip, left (HCC)  Frequent falls  Closed fracture of multiple ribs of right side, initial encounter      NEW MEDICATIONS STARTED DURING THIS VISIT:  New Prescriptions   No medications on file     Note:  This document was prepared using Dragon voice recognition software and may include unintentional dictation errors.    Willy Eddy, MD 05/02/17 2118

## 2017-05-02 NOTE — ED Provider Notes (Signed)
-----------------------------------------   8:51 AM on 05/02/2017 -----------------------------------------  I've seen the patient this morning, he continues to feel weak. Nurse states overnight he had difficulty attempting to ambulate. I again insisted that the patient is seen by social work for placement into a short-term rehabilitation/nursing facility. Patient is adamantly against this idea. Wishes to go home. States his sister's coming to pick him up and will be staying with him/near him. I discussed with the patient that if he changes his mind or has any further falls he should call his doctor or return to the emergency department.   Minna Antis, MD 05/02/17 [phone]

## 2017-05-02 NOTE — ED Provider Notes (Signed)
-----------------------------------------   6:45 AM on 05/02/2017 -----------------------------------------  No events overnight. Patient waiting for his sister to return from [facility] to transport him home as she has the keys to his residence. Care will be transferred to the oncoming provider.   Irean Hong, MD 05/02/17 [phone]

## 2017-05-02 NOTE — ED Notes (Signed)
Pt resting with eyes closed at this time.

## 2017-05-02 NOTE — ED Notes (Signed)
Pt sitting up at the end of the stretcher states he took off his brief because he was incontinent of stool. Clean linen and brief applied. Pt instructed to call for help when he need to use the bathroom and to not get up without assistance. callled pt sister to make sure she is coming and states she is but when I tried to ask about a time the service was disrupted.Marland Kitchen

## 2017-05-02 NOTE — ED Notes (Signed)
Stood patient up at bedside, patient steady, patient able to walk to commode assist x 1.

## 2017-05-02 NOTE — ED Notes (Signed)
EDP at the bedside, pt states his sister is suppose to move in next door to help care for him, states at this time is does not want to go to a care facility. Pt is calling his sister to come pick him up.Marland Kitchen

## 2017-05-02 NOTE — ED Notes (Signed)
Patient given food tray

## 2017-05-02 NOTE — ED Notes (Signed)
Pt cleansed of incontinent stool.  

## 2017-05-02 NOTE — Clinical Social Work Note (Addendum)
Clinical Social Work Assessment  Patient Details  Name: Brian Hampton MRN: [id number] Date of Birth: August 02, 1956  Date of referral:  05/02/17               Reason for consult:  Frequent Admissions / ED Visits, Discharge Planning, Capacity                Permission sought to share information with:  Family Supports, Case Manager, Other Permission granted to share information::  Yes, Verbal Permission Granted  Name::     Lovett Calender  Agency::  [facility] [place]  Relationship::  Sister  Contact Information:  [phone]  Housing/Transportation Living arrangements for the past 2 months:  [employer] of Information:  Patient, Other (Comment Required) Gracelyn Nurse) Patient Interpreter Needed:  None Criminal Activity/Legal Involvement Pertinent to Current Situation/Hospitalization:  No - Comment as needed Significant Relationships:  Siblings Lives with:  Self Do you feel safe going back to the place where you live?  Yes Need for family participation in patient care:  Yes (Comment)  Care giving concerns:  No caregiver concerns identified.    Social Worker assessment / plan:  CSW met with pt and sister at bedside to address Social Work consult. Pt has had 7 ED visits and 5 admissions in the last 6 months. CSW met with pt last week (04/27/17) during ED visit. (Please see CSW note.) Pt states he was at the doctor's office when he "blacked out" and his legs gave out, causing his fall. Pt states he has 2 walkers and a wheelchair and "uses them." Pt states that even when using his walker he is unable to stand at times. Due to living in a boarding home, using wheelchair not as feasible. CSW asked pt if pt feels living in the boarding home is the safest option for him at this time. Pt states no, but is still refusing to go to a long term care facility. CSW did discuss other levels of care available to pt and reminded pt that there are other facilities  from which to choose. Pt states he can stay with his sister, but sister is not agreeable, as she has a grandson staying with her.   CSW informed sister that pt is often confused and is concerned about the cause. MD also expressed some concern about pt capacity. Pt is a participant with [facility]) and [facility] [place] [facility]). CSW updated Flo Shanks of [place] [phone]) and Mickel Baas of [facility] [phone]). CSW also made an Adult Protective Services report to Burlene Arnt at [place] [phone]). CSW made pt aware that CSW would be making this report and why. CSW remains available for pt Social Work needs.   Employment status:  Disabled (Comment on whether or not currently receiving Disability) Insurance information:  Medicare, Medicaid [place] PT Recommendations:  Not assessed at this time Information / Referral to community resources:  [facility] (Comment Required: [place], Name & Number of worker spoken with), [place] ( [place]-Report made to Burlene Arnt [phone])  Patient/Family's Response to care:  Pt and sister are agreeable with pt being admitted for further testing.  Patient/Family's Understanding of and Emotional Response to Diagnosis, Current Treatment, and Prognosis:  Pt appears to understand diagnosis, but unsure of pt's understanding of current treatment and prognosis. Sister is aware and supportive of pt.   Emotional Assessment Appearance:  Appears stated age Attitude/Demeanor/Rapport:  Other (  Appropriate) Affect (typically observed):  Accepting, Pleasant, Calm Orientation:  Oriented to Self, Oriented to Place, Oriented to  Time, Oriented to Situation Alcohol / Substance use:  Other Psych involvement (Current and /or in the community):  No (Comment)  Discharge Needs  Concerns to be addressed:  Discharge Planning Concerns, Care Coordination Readmission within  the last 30 days:  Yes Current discharge risk:  Dependent with Mobility, Chronically ill, Lives alone Barriers to Discharge:  Continued Medical Work up   [employer], LCSW 05/02/2017, 5:07 PM

## 2017-05-03 DIAGNOSIS — F0280 Dementia in other diseases classified elsewhere without behavioral disturbance: Secondary | ICD-10-CM | POA: Diagnosis not present

## 2017-05-03 DIAGNOSIS — W19XXXA Unspecified fall, initial encounter: Secondary | ICD-10-CM | POA: Diagnosis not present

## 2017-05-03 DIAGNOSIS — R55 Syncope and collapse: Secondary | ICD-10-CM | POA: Diagnosis not present

## 2017-05-03 DIAGNOSIS — S2239XA Fracture of one rib, unspecified side, initial encounter for closed fracture: Secondary | ICD-10-CM | POA: Diagnosis not present

## 2017-05-03 DIAGNOSIS — B20 Human immunodeficiency virus [HIV] disease: Secondary | ICD-10-CM | POA: Diagnosis not present

## 2017-05-03 DIAGNOSIS — R410 Disorientation, unspecified: Secondary | ICD-10-CM | POA: Diagnosis not present

## 2017-05-03 LAB — CBC
HCT: 31.7 % — ABNORMAL LOW (ref 40.0–52.0)
Hemoglobin: 10.9 g/dL — ABNORMAL LOW (ref 13.0–18.0)
MCH: 34.9 pg — AB (ref 26.0–34.0)
MCHC: 34.5 g/dL (ref 32.0–36.0)
MCV: 101.4 fL — AB (ref 80.0–100.0)
PLATELETS: 245 10*3/uL (ref 150–440)
RBC: 3.13 MIL/uL — AB (ref 4.40–5.90)
RDW: 15.9 % — ABNORMAL HIGH (ref 11.5–14.5)
WBC: 7.9 10*3/uL (ref 3.8–10.6)

## 2017-05-03 LAB — BASIC METABOLIC PANEL
Anion gap: 8 (ref 5–15)
BUN: 14 mg/dL (ref 6–20)
CHLORIDE: 107 mmol/L (ref 101–111)
CO2: 26 mmol/L (ref 22–32)
CREATININE: 1.25 mg/dL — AB (ref 0.61–1.24)
Calcium: 8.4 mg/dL — ABNORMAL LOW (ref 8.9–10.3)
GFR calc Af Amer: >60 mL/min (ref >60)
GFR calc non Af Amer: >60 mL/min (ref >60)
GLUCOSE: 79 mg/dL (ref 65–99)
Potassium: 3.5 mmol/L (ref 3.5–5.1)
SODIUM: 141 mmol/L (ref 135–145)

## 2017-05-03 LAB — GLUCOSE, CAPILLARY
GLUCOSE-CAPILLARY: 70 mg/dL (ref 65–99)
GLUCOSE-CAPILLARY: 106 mg/dL — AB (ref 65–99)
Glucose-Capillary: 88 mg/dL (ref 65–99)

## 2017-05-03 LAB — MRSA PCR SCREENING: MRSA by PCR: NEGATIVE

## 2017-05-03 LAB — MAGNESIUM: Magnesium: 2.1 mg/dL (ref 1.7–2.4)

## 2017-05-03 MED ORDER — METOPROLOL SUCCINATE ER 50 MG PO TB24
50.0000 mg | ORAL_TABLET | Freq: Every day | ORAL | Status: DC
  Administered 2017-05-03 – 2017-05-05 (×3): 50 mg via ORAL
  Filled 2017-05-03 (×4): qty 1

## 2017-05-03 MED ORDER — AMIODARONE HCL 200 MG PO TABS
400.0000 mg | ORAL_TABLET | Freq: Every day | ORAL | Status: DC
  Administered 2017-05-03 – 2017-05-06 (×4): 400 mg via ORAL
  Filled 2017-05-03 (×4): qty 2

## 2017-05-03 NOTE — Evaluation (Signed)
Physical Therapy Evaluation Patient Details Name: Brian Hampton MRN: [id number] DOB: Oct 25, 1955 Today's Date: 05/03/2017   History of Present Illness  61 y.o. male with a known history of anemia, bronchial asthma, collagen vascular disease, COPD, coronary artery disease, recurrent DVT, hypertension, HIV infection, PE.  He is here with multiple frequent falls with multiple rib fractures, possible further adressing of L hip ANV.  Clinical Impression  Pt focused on L hip pain and wanting to have surgery.  He was able to do some limited in-room mobility and brief ambulation but c/o severe pain and could not continue more than a few feet.  Though he did need some light assist with mobility generally he showed good effort despite c/o considerable pain.  Pt will require STR on discharge, unsafe to return to previous status.    Follow Up Recommendations SNF    Equipment Recommendations       Recommendations for Other Services       Precautions / Restrictions Precautions Precautions: Fall Restrictions Weight Bearing Restrictions: No      Mobility  Bed Mobility Overal bed mobility: Needs Assistance Bed Mobility: Supine to Sit     Supine to sit: Min assist     General bed mobility comments: Pt pain limited in trying to get to EOB, but did not need excessive assist to lift torso.  Transfers Overall transfer level: Needs assistance Equipment used: Rolling walker (2 wheeled) Transfers: Sit to/from Stand Sit to Stand: Min assist         General transfer comment: Pt needing cuing and reminders to insure safety, able to rise with very little assist.  Pt lacking confidence  Ambulation/Gait Ambulation/Gait assistance: Mod assist Ambulation Distance (Feet): 7 Feet Assistive device: Rolling walker (2 wheeled)       General Gait Details: Pt able to do a small amount of minimal steps in the room, but begins to c/o pain as well as some fatigue and quickly needed to sit in recliner.  He  showed very poor tolerance, though he did not have any LOBs with the walker.   Stairs            Wheelchair Mobility    Modified Rankin (Stroke Patients Only)       Balance Overall balance assessment: Needs assistance   Sitting balance-Leahy Scale: Fair       Standing balance-Leahy Scale: Fair Standing balance comment: reliant on walker to maintain standing balance                             Pertinent Vitals/Pain Pain Assessment: 0-10 Pain Score: 10-Worst pain ever Pain Location: L hip    Home Living Family/patient expects to be discharged to:: Skilled nursing facility (wants to move in with sister) Living Arrangements:  (boarding house?)                    Prior Function Level of Independence: Needs assistance   Gait / Transfers Assistance Needed: uses cane, has had multiple falls           Hand Dominance        Extremity/Trunk Assessment   Upper Extremity Assessment Upper Extremity Assessment:  (pain with resisted UE acts secondary to rib fx's)    Lower Extremity Assessment Lower Extremity Assessment: Generalized weakness (pain limited with L hip )       Communication   Communication: No difficulties  Cognition Arousal/Alertness: Awake/alert Behavior During Therapy:  Restless Overall Cognitive Status: History of cognitive impairments - at baseline                                        General Comments      Exercises     Assessment/Plan    PT Assessment Patient needs continued PT services  PT Problem List Decreased strength;Decreased range of motion;Decreased activity tolerance;Decreased balance;Decreased mobility;Decreased coordination;Decreased knowledge of use of DME;Decreased safety awareness;Decreased cognition;Cardiopulmonary status limiting activity;Pain       PT Treatment Interventions DME instruction;Gait training;Stair training;Functional mobility training;Therapeutic activities;Balance  training;Therapeutic exercise;Cognitive remediation;Patient/family education    PT Goals (Current goals can be found in the Care Plan section)  Acute Rehab PT Goals Patient Stated Goal: have hip surgery ASAP PT Goal Formulation: With patient Time For Goal Achievement: 05/17/17 Potential to Achieve Goals: Fair    Frequency Min 2X/week   Barriers to discharge        Co-evaluation               AM-PAC PT "6 Clicks" Daily Activity  Outcome Measure Difficulty turning over in bed (including adjusting bedclothes, sheets and blankets)?: A Little Difficulty moving from lying on back to sitting on the side of the bed? : A Lot Difficulty sitting down on and standing up from a chair with arms (e.g., wheelchair, bedside commode, etc,.)?: Unable Help needed moving to and from a bed to chair (including a wheelchair)?: A Little Help needed walking in hospital room?: A Lot Help needed climbing 3-5 steps with a railing? : A Lot 6 Click Score: 13    End of Session Equipment Utilized During Treatment: Gait belt Activity Tolerance: Patient limited by pain Patient left: with chair alarm set;with call bell/phone within reach Nurse Communication: Mobility status PT Visit Diagnosis: Muscle weakness (generalized) (M62.81);Difficulty in walking, not elsewhere classified (R26.2);Pain Pain - Right/Left: Left Pain - part of body: Hip    Time: 1610-9604 PT Time Calculation (min) (ACUTE ONLY): 24 min   Charges:   PT Evaluation $PT Eval Low Complexity: 1 Low     PT G Codes:   PT G-Codes **NOT FOR INPATIENT CLASS** Functional Assessment Tool Used: AM-PAC 6 Clicks Basic Mobility Functional Limitation: Mobility: Walking and moving around Mobility: Walking and Moving Around Current Status (V4098): At least 40 percent but less than 60 percent impaired, limited or restricted Mobility: Walking and Moving Around Goal Status [phone]): At least 20 percent but less than 40 percent impaired, limited or  restricted    Malachi Pro, DPT 05/03/2017, 12:06 PM

## 2017-05-03 NOTE — Consult Note (Signed)
HOBIE KOHLES is a 61 y.o. male  [id number]  Primary Cardiologist: Dr. Adrian Blackwater Reason for Consultation: Syncope and collapse  HPI: Brian Hampton is a 61yo male with a known history of DVT and PE, CAD, COPD, HTN, myocardial infarction. He was discharged yesterday but had a fall at our office after the visit and had hip and rib pain. His oxygen saturations were low and EMS was called to take him back to the hospital.    Review of Systems: Pt reports pain "everywhere". He has central chest pain, rib pain, and hip pain. He has shortness of breath but is not currently on oxygen. He states he is aware of the need for hip surgery.    Past Medical History:  Diagnosis Date  . Anemia   . Asthma   . Cataracts, bilateral    worse in Rt eye  . Collagen vascular disease (HCC)   . COPD (chronic obstructive pulmonary disease) (HCC)   . Coronary artery disease   . Depression   . DVT (deep venous thrombosis) (HCC)   . Dysrhythmia   . Emphysema/COPD (HCC)   . Environmental and seasonal allergies   . H/O blood clots   . Heart murmur   . HIV (human immunodeficiency virus infection) (HCC)   . Hypertension   . Leaky heart valve    x 3  . Lung mass   . Myocardial infarction (HCC)    in 2000  . Pneumonia    year ago  . Pulmonary emboli (HCC)   . Type 2 diabetes mellitus (HCC)     Medications Prior to Admission  Medication Sig Dispense Refill  . abacavir-dolutegravir-lamiVUDine (TRIUMEQ) 600-50-300 MG tablet Take 1 tablet by mouth daily after breakfast. Reported on 01/30/2016    . acetaminophen (TYLENOL) 325 MG tablet Take 1 tablet (325 mg total) by mouth every 6 (six) hours as needed for mild pain (or Fever >/= 101).    Marland Kitchen albuterol (PROVENTIL HFA;VENTOLIN HFA) 108 (90 BASE) MCG/ACT inhaler Inhale 2 puffs into the lungs every 6 (six) hours as needed for wheezing or shortness of breath.    Marland Kitchen albuterol (PROVENTIL) (2.5 MG/3ML) 0.083% nebulizer solution Take 2.5 mg by nebulization 3 (three)  times daily.     Marland Kitchen amiodarone (PACERONE) 200 MG tablet Take 600 mg by mouth daily.  0  . atorvastatin (LIPITOR) 10 MG tablet Take 10 mg by mouth daily after breakfast.    . Calcium Carbonate-Vitamin D (CALCIUM 600+D) 600-400 MG-UNIT tablet Take 1 tablet by mouth daily after breakfast.     . carvedilol (COREG) 12.5 MG tablet Take 12.5 mg by mouth 2 (two) times daily with a meal.    . cetirizine (ZYRTEC) 10 MG tablet Take 10 mg by mouth daily as needed for allergies.     . cyclobenzaprine (FLEXERIL) 5 MG tablet Take 1 tablet (5 mg total) by mouth 3 (three) times daily as needed for muscle spasms. 45 tablet 0  . docusate sodium (COLACE) 50 MG capsule Take 50 mg by mouth daily as needed for mild constipation.     Marland Kitchen EPINEPHrine (EPIPEN 2-PAK) 0.3 mg/0.3 mL IJ SOAJ injection Inject 0.3 mg into the muscle once as needed. For anaphylaxis    . escitalopram (LEXAPRO) 20 MG tablet Take 20 mg by mouth daily after breakfast.     . fondaparinux (ARIXTRA) 7.5 MG/0.6ML SOLN injection Inject 0.6 mLs (7.5 mg total) into the skin daily. 30 Syringe 2  . furosemide (LASIX) 20 MG tablet  Take 20 mg by mouth daily.     Marland Kitchen HYDROcodone-acetaminophen (NORCO) 5-325 MG tablet Take 1 tablet by mouth every 6 (six) hours as needed for moderate pain. 45 tablet 0  . levothyroxine (SYNTHROID, LEVOTHROID) 50 MCG tablet Take 50 mcg by mouth daily before breakfast.    . linaclotide (LINZESS) 72 MCG capsule Take 72 mcg by mouth daily after breakfast.    . losartan (COZAAR) 25 MG tablet Take 25 mg by mouth every morning.  0  . Magnesium Oxide 500 MG TABS Take 500 mg by mouth daily after breakfast.    . meloxicam (MOBIC) 7.5 MG tablet Take 7.5 mg by mouth daily as needed for pain.    . mirtazapine (REMERON) 15 MG tablet Take 15 mg by mouth at bedtime.     . mometasone-formoterol (DULERA) 200-5 MCG/ACT AERO Inhale 2 puffs into the lungs 2 (two) times daily.     . Multiple Vitamin (MULTIVITAMIN WITH MINERALS) TABS tablet Take 1 tablet by  mouth daily.     . nitroGLYCERIN (NITROSTAT) 0.4 MG SL tablet Place 0.4 mg under the tongue every 5 (five) minutes as needed for chest pain.    Marland Kitchen omeprazole (PRILOSEC) 40 MG capsule Take 40 mg by mouth daily after breakfast.    . Potassium Chloride CR (MICRO-K) 8 MEQ CPCR capsule CR Take 8 mEq by mouth daily after breakfast.     . pregabalin (LYRICA) 75 MG capsule Take 75 mg by mouth 2 (two) times daily.    . sennosides-docusate sodium (SENOKOT-S) 8.6-50 MG tablet Take 1 tablet by mouth at bedtime as needed for constipation.    Marland Kitchen tiotropium (SPIRIVA) 18 MCG inhalation capsule Place 18 mcg into inhaler and inhale daily.    . traMADol (ULTRAM) 50 MG tablet Take 50 mg by mouth 2 (two) times daily.     Marland Kitchen zolpidem (AMBIEN) 5 MG tablet Take 5 mg by mouth at bedtime.       Marland Kitchen abacavir-dolutegravir-lamiVUDine  1 tablet Oral QPC breakfast  . albuterol  2.5 mg Nebulization TID  . amiodarone  600 mg Oral Daily  . atorvastatin  10 mg Oral QPC breakfast  . carvedilol  12.5 mg Oral BID WC  . escitalopram  20 mg Oral QPC breakfast  . fondaparinux (ARIXTRA) injection  7.5 mg Subcutaneous Q0600  . insulin aspart  0-9 Units Subcutaneous TID WC  . levothyroxine  50 mcg Oral QAC breakfast  . linaclotide  72 mcg Oral QPC breakfast  . magnesium oxide  400 mg Oral QPC breakfast  . mirtazapine  15 mg Oral QHS  . mometasone-formoterol  2 puff Inhalation BID  . neomycin-bacitracin-polymyxin   Topical TID  . pantoprazole  40 mg Oral Daily  . potassium chloride  10 mEq Oral Daily  . pregabalin  75 mg Oral BID  . tiotropium  18 mcg Inhalation Daily  . traMADol  50 mg Oral BID  . zolpidem  5 mg Oral QHS    Infusions:   Allergies  Allergen Reactions  . Aspirin Anaphylaxis  . Bee Venom Anaphylaxis  . Penicillins Anaphylaxis and Other (See Comments)    Has patient had a PCN reaction causing immediate rash, facial/tongue/throat swelling, SOB or lightheadedness with hypotension: Yes Has patient had a PCN  reaction causing severe rash involving mucus membranes or skin necrosis: No Has patient had a PCN reaction that required hospitalization No Has patient had a PCN reaction occurring within the last 10 years: Yes If all of the above answers  are "NO", then may proceed with Cephalosporin use.  . Prednisone Anaphylaxis  . Sulfa Antibiotics Anaphylaxis  . Sulfasalazine Anaphylaxis  . Theophyllines Anaphylaxis  . Theophylline Swelling    Social History   Social History  . Marital status: Widowed    Spouse name: N/A  . Number of children: N/A  . Years of education: N/A   Occupational History  . Not on file.   Social History Main Topics  . Smoking status: Former Smoker    Quit date: 12/16/2001  . Smokeless tobacco: Never Used  . Alcohol use No  . Drug use: No  . Sexual activity: Not on file   Other Topics Concern  . Not on file   Social History Narrative  . No narrative on file    Family History  Problem Relation Age of Onset  . CAD Unknown   . Asthma Mother   . Cirrhosis Father   . Deep vein thrombosis Neg Hx   . Diabetes Neg Hx     PHYSICAL EXAM: Vitals:   05/03/17 0754 05/03/17 0812  BP:  123/71  Pulse:  88  Resp:    Temp:  (!) 97.5 F (36.4 C)  SpO2: 97% 97%     Intake/Output Summary (Last 24 hours) at 05/03/17 0850 Last data filed at 05/02/17 1841  Gross per 24 hour  Intake             1000 ml  Output                0 ml  Net             1000 ml    General: Remains pale, but facial swelling and color have improved form yesterday's assessment.  HEENT: normal Neck: supple. no JVD. Carotids 2+ bilat; no bruits. No lymphadenopathy or thryomegaly appreciated. Cor: PMI nondisplaced. Regular rate & rhythm. No rubs, gallops or murmurs. Lungs: clear Abdomen: soft, nontender, nondistended. No hepatosplenomegaly. No bruits or masses. Good bowel sounds. Extremities: no cyanosis, clubbing, rash, edema Neuro: alert & oriented x 3, cranial nerves grossly intact.  moves all 4 extremities w/o difficulty. Affect pleasant.  ECG: NSR 86bpm, no acute changes  Results for orders placed or performed during the hospital encounter of 05/02/17 (from the past 24 hour(s))  Basic metabolic panel     Status: Abnormal   Collection Time: 05/02/17  2:15 PM  Result Value Ref Range   Sodium 138 135 - 145 mmol/L   Potassium 3.1 (L) 3.5 - 5.1 mmol/L   Chloride 102 101 - 111 mmol/L   CO2 24 22 - 32 mmol/L   Glucose, Bld 113 (H) 65 - 99 mg/dL   BUN 17 6 - 20 mg/dL   Creatinine, Ser 3.08 (H) 0.61 - 1.24 mg/dL   Calcium 8.2 (L) 8.9 - 10.3 mg/dL   GFR calc non Af Amer 58 (L) >60 mL/min   GFR calc Af Amer >60 >60 mL/min   Anion gap 12 5 - 15  CBC     Status: Abnormal   Collection Time: 05/02/17  2:15 PM  Result Value Ref Range   WBC 9.2 3.8 - 10.6 K/uL   RBC 3.26 (L) 4.40 - 5.90 MIL/uL   Hemoglobin 10.9 (L) 13.0 - 18.0 g/dL   HCT 65.7 (L) 84.6 - 96.2 %   MCV 98.5 80.0 - 100.0 fL   MCH 33.5 26.0 - 34.0 pg   MCHC 34.1 32.0 - 36.0 g/dL   RDW 95.2 (H) 84.1 -  14.5 %   Platelets 304 150 - 440 K/uL  Hepatic function panel     Status: Abnormal   Collection Time: 05/02/17  2:15 PM  Result Value Ref Range   Total Protein 6.5 6.5 - 8.1 g/dL   Albumin 2.6 (L) 3.5 - 5.0 g/dL   AST 22 15 - 41 U/L   ALT 17 17 - 63 U/L   Alkaline Phosphatase 156 (H) 38 - 126 U/L   Total Bilirubin 1.0 0.3 - 1.2 mg/dL   Bilirubin, Direct 0.2 0.1 - 0.5 mg/dL   Indirect Bilirubin 0.8 0.3 - 0.9 mg/dL  Ammonia     Status: None   Collection Time: 05/02/17  4:31 PM  Result Value Ref Range   Ammonia 10 9 - 35 umol/L  Type and screen [facility]     Status: None   Collection Time: 05/02/17  4:31 PM  Result Value Ref Range   ABO/RH(D) A POS    Antibody Screen NEG    Sample Expiration 05/05/2017   Protime-INR     Status: None   Collection Time: 05/02/17  4:31 PM  Result Value Ref Range   Prothrombin Time 15.0 11.4 - 15.2 seconds   INR 1.19   Blood gas, venous      Status: Abnormal (Preliminary result)   Collection Time: 05/02/17  4:31 PM  Result Value Ref Range   FIO2 0.21    pH, Ven 7.43 7.250 - 7.430   pCO2, Ven 43 (L) 44.0 - 60.0 mmHg   pO2, Ven <31.0 (LL) 32.0 - 45.0 mmHg   Bicarbonate 28.5 (H) 20.0 - 28.0 mmol/L   Acid-Base Excess 3.7 (H) 0.0 - 2.0 mmol/L   O2 Saturation PENDING %   Patient temperature 37.0    Collection site VENOUS    Sample type VENOUS   Lactic acid, plasma     Status: None   Collection Time: 05/02/17  7:02 PM  Result Value Ref Range   Lactic Acid, Venous 1.1 0.5 - 1.9 mmol/L  Urinalysis, Complete w Microscopic     Status: Abnormal   Collection Time: 05/02/17  8:25 PM  Result Value Ref Range   Color, Urine YELLOW (A) YELLOW   APPearance CLEAR (A) CLEAR   Specific Gravity, Urine 1.017 1.005 - 1.030   pH 5.0 5.0 - 8.0   Glucose, UA NEGATIVE NEGATIVE mg/dL   Hgb urine dipstick NEGATIVE NEGATIVE   Bilirubin Urine NEGATIVE NEGATIVE   Ketones, ur 5 (A) NEGATIVE mg/dL   Protein, ur 30 (A) NEGATIVE mg/dL   Nitrite NEGATIVE NEGATIVE   Leukocytes, UA NEGATIVE NEGATIVE   RBC / HPF 0-5 0 - 5 RBC/hpf   WBC, UA 0-5 0 - 5 WBC/hpf   Bacteria, UA NONE SEEN NONE SEEN   Squamous Epithelial / LPF NONE SEEN NONE SEEN   Mucus PRESENT   Glucose, capillary     Status: None   Collection Time: 05/02/17 10:26 PM  Result Value Ref Range   Glucose-Capillary 90 65 - 99 mg/dL   Comment 1 Notify RN   MRSA PCR Screening     Status: None   Collection Time: 05/03/17  4:54 AM  Result Value Ref Range   MRSA by PCR NEGATIVE NEGATIVE  Basic metabolic panel     Status: Abnormal   Collection Time: 05/03/17  7:47 AM  Result Value Ref Range   Sodium 141 135 - 145 mmol/L   Potassium 3.5 3.5 - 5.1 mmol/L   Chloride 107 101 -  111 mmol/L   CO2 26 22 - 32 mmol/L   Glucose, Bld 79 65 - 99 mg/dL   BUN 14 6 - 20 mg/dL   Creatinine, Ser 1.61 (H) 0.61 - 1.24 mg/dL   Calcium 8.4 (L) 8.9 - 10.3 mg/dL   GFR calc non Af Amer >60 >60 mL/min   GFR calc  Af Amer >60 >60 mL/min   Anion gap 8 5 - 15  CBC     Status: Abnormal   Collection Time: 05/03/17  7:47 AM  Result Value Ref Range   WBC 7.9 3.8 - 10.6 K/uL   RBC 3.13 (L) 4.40 - 5.90 MIL/uL   Hemoglobin 10.9 (L) 13.0 - 18.0 g/dL   HCT 09.6 (L) 04.5 - 40.9 %   MCV 101.4 (H) 80.0 - 100.0 fL   MCH 34.9 (H) 26.0 - 34.0 pg   MCHC 34.5 32.0 - 36.0 g/dL   RDW 81.1 (H) 91.4 - 78.2 %   Platelets 245 150 - 440 K/uL  Glucose, capillary     Status: None   Collection Time: 05/03/17  7:53 AM  Result Value Ref Range   Glucose-Capillary 70 65 - 99 mg/dL   Dg Chest 1 View  Result Date: 05/02/2017 CLINICAL DATA:  Near syncopal episode EXAM: CHEST 1 VIEW COMPARISON:  05/01/2017, 04/26/2017, CT chest 03/30/2017 FINDINGS: Linear atelectasis or scar at the left base. No consolidation or effusion. Normal cardiomediastinal silhouette. No pneumothorax. Old left fifth rib fracture. Possible acute right sixth and seventh rib fractures. IMPRESSION: 1. Negative for pleural effusion or pneumothorax 2. Probable acute right sixth and seventh rib fractures Electronically Signed   By: Jasmine Pang M.D.   On: 05/02/2017 16:11   Dg Chest 2 View  Result Date: 05/01/2017 CLINICAL DATA:  Larey Seat last evening. EXAM: CHEST  2 VIEW COMPARISON:  04/26/2017 FINDINGS: The cardiac silhouette, mediastinal and hilar contours are within normal limits and stable. Mild tortuosity of the thoracic aorta. The lungs are clear of an acute process. No pleural effusions or pneumothorax. The bony thorax is intact. Remote healed rib fractures are noted bilaterally. Remote vertebral augmentation changes in the midthoracic spine and remote compression deformities. IMPRESSION: No acute cardiopulmonary findings. No acute bony findings.  Chronic bony changes. Electronically Signed   By: Rudie Meyer M.D.   On: 05/01/2017 12:51   Dg Pelvis 1-2 Views  Result Date: 05/01/2017 CLINICAL DATA:  Per EMS pt comes from home (a boarding house) and fell last  night 8:00. Pt was unable to get up and called EMS this morning for help. Pt has abrasions on both knees and states generalized pain Hx/o asthma,COPD, DVT, CAD, HIV,.*comment was truncated* EXAM: PELVIS - 1-2 VIEW COMPARISON:  None. FINDINGS: There is no evidence of pelvic fracture or diastasis. No pelvic bone lesions are seen. IMPRESSION: No pelvic fracture. Electronically Signed   By: Genevive Bi M.D.   On: 05/01/2017 12:49   Ct Head Wo Contrast  Result Date: 05/02/2017 CLINICAL DATA:  61 year old male with near syncopal event at doctor's office today. EXAM: CT HEAD WITHOUT CONTRAST TECHNIQUE: Contiguous axial images were obtained from the base of the skull through the vertex without intravenous contrast. COMPARISON:  Head CT 04/26/2017. FINDINGS: Brain: Patchy and confluent areas of decreased attenuation are noted throughout the deep and periventricular white matter of the cerebral hemispheres bilaterally, compatible with chronic microvascular ischemic disease. No evidence of acute infarction, hemorrhage, hydrocephalus, extra-axial collection or mass lesion/mass effect. Vascular: No hyperdense vessel or unexpected calcification.  Skull: Lucent area in the right frontal bone stable compared to numerous prior examinations dating back to 01/29/2016, nonspecific but presumably benign (potentially an area of fibrous dysplasia). Negative for fracture. Sinuses/Orbits: No acute finding. Other: None. IMPRESSION: 1. No acute intracranial abnormalities. 2. Chronic microvascular ischemic changes in the cerebral white matter redemonstrated, as above. Electronically Signed   By: Trudie Reed M.D.   On: 05/02/2017 15:43   Ct Pelvis Wo Contrast  Result Date: 05/02/2017 CLINICAL DATA:  Patient fell last evening. Generalized as well as left hip pain. EXAM: CT PELVIS WITHOUT CONTRAST TECHNIQUE: Multidetector CT imaging of the pelvis was performed following the standard protocol without intravenous contrast.  COMPARISON:  05/01/2017 radiographs, CT 03/04/2017 FINDINGS: Urinary Tract:  Physiologically distended urinary bladder. Bowel: Moderate fecal residue within the rectal vault. Mild rectal wall thickening surrounding the stool ball is noted which may reflect a mild stercoral colitis. Vascular/Lymphatic: Minimal atherosclerosis of the common iliac arteries. No aneurysm. No lymphadenopathy. Reproductive:  No mass or other significant abnormality Other:  None. Musculoskeletal: Slight flattening of the weight-bearing portions of both femoral heads consistent with changes of AVN. Axial joint space narrowing is identified of both hips reveal bone island is noted of the right femoral neck. No acute displaced fracture or joint effusion. No intramuscular hemorrhage or significant muscle atrophy. IMPRESSION: 1. Subtle flattening along the weight-bearing portions of both femoral heads consistent with changes of AVN. 2. No acute displaced appearing fracture or joint dislocation. 3. Moderate fecal residue within the included colon and rectum. Mild rectal wall thickening may be secondary to a a stercoral colitis. Electronically Signed   By: Tollie Eth M.D.   On: 05/02/2017 18:55   Dg Knee Complete 4 Views Left  Result Date: 05/01/2017 CLINICAL DATA:  Larey Seat last evening.  Left knee pain. EXAM: LEFT KNEE - COMPLETE 4+ VIEW COMPARISON:  04/26/2017 FINDINGS: The joint spaces are maintained. No acute fracture or osteochondral lesion. No joint effusion. IMPRESSION: No acute bony findings. Electronically Signed   By: Rudie Meyer M.D.   On: 05/01/2017 12:49   Dg Knee Complete 4 Views Right  Result Date: 05/01/2017 CLINICAL DATA:  Larey Seat last evening.  Right knee pain. EXAM: RIGHT KNEE - COMPLETE 4+ VIEW COMPARISON:  04/26/2017 FINDINGS: The joint spaces are maintained. No acute fracture, osteochondral lesion or joint effusion. IMPRESSION: No acute bony findings. Electronically Signed   By: Rudie Meyer M.D.   On: 05/01/2017 12:52    Dg Hip Unilat W Or Wo Pelvis 2-3 Views Left  Result Date: 05/02/2017 CLINICAL DATA:  Hip pain after syncopal episode EXAM: DG HIP (WITH OR WITHOUT PELVIS) 2-3V LEFT COMPARISON:  04/24/2017 FINDINGS: No fracture or malalignment. Pubic symphysis is intact. Slight flattening and subtle sclerosis at the left greater than right femoral heads. Mild degenerative changes of both hips. IMPRESSION: 1. No acute osseous abnormality 2. Slight flattening and subtle sclerosis in the left greater than right femoral heads suggestive of mild AVN. Electronically Signed   By: Jasmine Pang M.D.   On: 05/02/2017 16:12     ASSESSMENT AND PLAN: Syncope and collapse with history of Afib with RVR. Pt's EKG shows NSR 82bpm and no acute changes, he had a stress test 04/29/17 which was unremarkable and echo had normal ejection fraction with mild MR. Carotid doppler done 03/14/17 showed mild (1-49%) stenosis proximal right internal carotid artery.  Creatinine is 1.25 and potassium is currently WNL.  Will change amiodarone to 400mg  daily and switch coreg to metorpolol   daily as discussed in our office visit yesterday prior to admission.  There are no significant findings on cardiac testing that would suggest a cause for syncope. We are concerned about the patient's recurrent DVTs and pulmonary embolism and bleeding evident from wounds on arms. Pt reports blood in urine as well but UA was negative for blood on admission. Recommend workup by neurology and continued input from hematology.   Will continue to follow pt's hospital course but from cardiac standpoint he is stable with no further work up needed at this time.    Caroleen Hamman

## 2017-05-03 NOTE — Consult Note (Signed)
Patient is seen for bilateral hip pain is probably secondary to avascular necrosis.  I have seen him in the past for kyphoplasty. He currently has C. difficile and is symptomatic. On exam he has significant pain with any log rolling or rotation of both hips left quite a bit worse than the right His x-rays show flattening of the left femoral head less involvement in the right femoral head but these are both consistent with avascular necrosis.  Impression is bilateral hip AVN Recommendation is first he needs to have his C. difficile resolved before he can have elective total joint surgery. Additionally he needs to be checked by Dr. Sampson Goon prior to consideration for surgery with his immunodeficiency status and meets who he is optimized for elective joint replacement. I have ordered physical therapy to try to get him mobile and hopefully stable so this can be done in the near future but I don't think he can be done during this hospitalization

## 2017-05-03 NOTE — Progress Notes (Signed)
[employer] - [place] [facility]   PATIENT NAME: Brian Hampton    MR#:  [id number]  DATE OF BIRTH:  May 04, 1956  SUBJECTIVE:  CHIEF COMPLAINT:   Chief Complaint  Patient presents with  . Weakness   The patient complains of back pain and hip pain this morning. He complained of diarrhea to Dr. Rosita Kea and the nurse. REVIEW OF SYSTEMS:  Review of Systems  Constitutional: Positive for malaise/fatigue. Negative for chills and fever.  HENT: Negative for sore throat.   Eyes: Negative for blurred vision and double vision.  Respiratory: Negative for cough, hemoptysis, shortness of breath, wheezing and stridor.   Cardiovascular: Negative for chest pain, palpitations, orthopnea and leg swelling.  Gastrointestinal: Positive for diarrhea. Negative for abdominal pain, blood in stool, melena, nausea and vomiting.  Genitourinary: Negative for dysuria, flank pain and hematuria.  Musculoskeletal: Positive for back pain and joint pain.  Neurological: Positive for weakness. Negative for dizziness, sensory change, focal weakness, seizures, loss of consciousness and headaches.  Endo/Heme/Allergies: Negative for polydipsia.  Psychiatric/Behavioral: Negative for depression. The patient is not nervous/anxious.     DRUG ALLERGIES:   Allergies  Allergen Reactions  . Aspirin Anaphylaxis  . Bee Venom Anaphylaxis  . Penicillins Anaphylaxis and Other (See Comments)    Has patient had a PCN reaction causing immediate rash, facial/tongue/throat swelling, SOB or lightheadedness with hypotension: Yes Has patient had a PCN reaction causing severe rash involving mucus membranes or skin necrosis: No Has patient had a PCN reaction that required hospitalization No Has patient had a PCN reaction occurring within the last 10 years: Yes If all of the above answers are "NO", then may proceed with Cephalosporin use.  . Prednisone Anaphylaxis  . Sulfa Antibiotics Anaphylaxis  . Sulfasalazine  Anaphylaxis  . Theophyllines Anaphylaxis  . Theophylline Swelling   VITALS:  Blood pressure 123/71, pulse 88, temperature (!) 97.5 F (36.4 C), temperature source Oral, resp. rate 16, height  (1.651 m), weight 170 lb (77.1 kg), SpO2 97 %. PHYSICAL EXAMINATION:  Physical Exam  Constitutional: He is oriented to person, place, and time and well-developed, well-nourished, and in no distress.  HENT:  Head: Normocephalic.  Mouth/Throat: Oropharynx is clear and moist.  Eyes: Pupils are equal, round, and reactive to light. Conjunctivae and EOM are normal. No scleral icterus.  Neck: Normal range of motion. Neck supple. No JVD present. No tracheal deviation present.  Cardiovascular: Normal rate, regular rhythm and normal heart sounds.  Exam reveals no gallop.   No murmur heard. Pulmonary/Chest: Effort normal and breath sounds normal. No respiratory distress. He has no wheezes. He has no rales.  Abdominal: Soft. Bowel sounds are normal. He exhibits no distension. There is no tenderness. There is no rebound.  Musculoskeletal: Normal range of motion. He exhibits no edema or tenderness.  Neurological: He is alert and oriented to person, place, and time. No cranial nerve deficit.  Skin: No rash noted. No erythema.  Psychiatric: Affect normal.   LABORATORY PANEL:  Male CBC  Recent Labs Lab 05/03/17 0747  WBC 7.9  HGB 10.9*  HCT 31.7*  PLT 245   ------------------------------------------------------------------------------------------------------------------ Chemistries   Recent Labs Lab 05/02/17 1415 05/03/17 0747  NA 138 141  K 3.1* 3.5  CL 102 107  CO2 24 26  GLUCOSE 113* 79  BUN 17 14  CREATININE 1.31* 1.25*  CALCIUM 8.2* 8.4*  MG  --  2.1  AST 22  --   ALT 17  --  ALKPHOS 156*  --   BILITOT 1.0  --    RADIOLOGY:  Ct Pelvis Wo Contrast  Result Date: 05/02/2017 CLINICAL DATA:  Patient fell last evening. Generalized as well as left hip pain. EXAM: CT PELVIS WITHOUT  CONTRAST TECHNIQUE: Multidetector CT imaging of the pelvis was performed following the standard protocol without intravenous contrast. COMPARISON:  05/01/2017 radiographs, CT 03/04/2017 FINDINGS: Urinary Tract:  Physiologically distended urinary bladder. Bowel: Moderate fecal residue within the rectal vault. Mild rectal wall thickening surrounding the stool ball is noted which may reflect a mild stercoral colitis. Vascular/Lymphatic: Minimal atherosclerosis of the common iliac arteries. No aneurysm. No lymphadenopathy. Reproductive:  No mass or other significant abnormality Other:  None. Musculoskeletal: Slight flattening of the weight-bearing portions of both femoral heads consistent with changes of AVN. Axial joint space narrowing is identified of both hips reveal bone island is noted of the right femoral neck. No acute displaced fracture or joint effusion. No intramuscular hemorrhage or significant muscle atrophy. IMPRESSION: 1. Subtle flattening along the weight-bearing portions of both femoral heads consistent with changes of AVN. 2. No acute displaced appearing fracture or joint dislocation. 3. Moderate fecal residue within the included colon and rectum. Mild rectal wall thickening may be secondary to a a stercoral colitis. Electronically Signed   By: Tollie Eth M.D.   On: 05/02/2017 18:55   ASSESSMENT AND PLAN:   * Syncope Etiology unclear. Patient had stress test and echocardiogram at his cardiologist's office which were normal. Per Dr. Milta Deiters NP, no further cardiac workup.  * Acute right sixth and seventh rib fractures. Pain control. Incentive spirometer.  * Mild cognitive impairment. Patient has been noticed to have memory gaps along with periods of disorientation. Follow up psychiatry for competency evaluation. APS involved in patient's care from the emergency room. Social work consult.  * Hip avascular necrosis.  May need surgery in the future, he needs to be checked by Dr. Sampson Goon  prior to consideration for surgery with his immunodeficiency status and meets who he is optimized for elective joint replacement per Dr. Rosita Kea.  * Diabetes mellitus. Continue home medications and sliding scale insulin.  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  * HIV. Continue home medications.  * Dementia. Consult psychiatry for competency.  Diarrhea. Follow-up C. Difficile test.  PT evaluation. SNF.  All the records are reviewed and case discussed with Care Management/Social Worker. Management plans discussed with the patient, family and they are in agreement.  CODE STATUS: Full Code  TOTAL TIME TAKING CARE OF THIS PATIENT: 37 minutes.   More than 50% of the time was spent in counseling/coordination of care: YES  POSSIBLE D/C IN 1 DAYS, DEPENDING ON CLINICAL CONDITION.   Shaune Pollack M.D on 05/03/2017 at 4:27 PM  Between 7am to 6pm - Pager - [phone]  After 6pm go to [email or web address] - Therapist, nutritional Hospitalists

## 2017-05-03 NOTE — Care Management (Addendum)
This is patient's sixth presentation to hospital over last six months. I met again with patient to again explain Medicare OBServation letter. He states that Dr. Humphrey Rolls has told him "he was not going to be discharged to home any time soon and that he should anticipate orthopedic surgery for a fracture he didn't know he had".  Patient is not certain when surgery will be but anticipates this visit. UR nurses updated. Moon delivered. Currently patient has no payer for SNF under observation. He is currently opened to [facility] home health and they plan to visit with him today.Per CSW note patient is followed by [facility]. He is from a boarding house. RNCM will follow. CSW confirmed that patient is followed by home Palliative and [facility] home health (and [facility]). Ortho consult pending.
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
## 2017-05-03 NOTE — Care Management Obs Status (Signed)
MEDICARE OBSERVATION STATUS NOTIFICATION   Patient Details  Name: Brian Hampton MRN: [id number] Date of Birth: [birth year]   Medicare Observation Status Notification Given:  Yes  Patient on isolation for c-diff. Copy delivered and explained.   Collie Siad, RN 05/03/2017, 9:56 AM

## 2017-05-03 NOTE — Progress Notes (Signed)
Please note patient is being followed by Out Patient Palliative Care team. CSW Fredric Mare made aware. Thank you. Dayna Barker RN, BSN, [facility] [place], hospital Liaison  [phone] c
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
## 2017-05-03 NOTE — [place] FL2 (Signed)
[place] MEDICAID FL2 LEVEL OF CARE SCREENING TOOL     IDENTIFICATION  Patient Name: Brian Hampton Birthdate: 11-04-1955 Sex: male Admission Date (Current Location): 05/02/2017  [facility] [place] Number:  [name]  ([id number]) Facility and Address:  [facility], [address] [place], [place] [address]      Provider Number: [id number]  Attending Physician Name and Address:  Shaune Pollack, MD  Relative Name and Phone Number:       Current Level of Care: Hospital Recommended Level of Care: Skilled Nursing Facility Prior Approval Number:    Date Approved/Denied:   PASRR Number:  [id number])  Discharge Plan: SNF    Current Diagnoses: Patient Active Problem List   Diagnosis Date Noted  . Falls 05/02/2017  . Acute deep vein thrombosis (DVT) of femoral vein of right lower extremity (HCC) 04/04/2017  . Left leg pain 03/22/2017  . DVT (deep venous thrombosis) (HCC) 03/12/2017  . Acute deep vein thrombosis (DVT) of right lower extremity (HCC) 02/25/2017  . Postoperative pain   . COPD exacerbation (HCC) 12/23/2016  . Abdominal pain   . Compression fracture of body of thoracic vertebra (HCC) 12/21/2016  . Hypomagnesemia 05/05/2016  . Rhabdomyolysis 05/05/2016  . Acute pulmonary embolism (HCC) 03/17/2016  . Right leg DVT (HCC) 03/17/2016  . Hypokalemia 03/17/2016  . S/P IVC filter 03/17/2016  . SOB (shortness of breath) 03/15/2016  . Dysphagia 02/02/2016  . GERD (gastroesophageal reflux disease) 02/02/2016  . Hyperthyroidism 01/31/2016  . 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 Elevated transaminase level 01/31/2016  . Anemia 01/31/2016  . Thrombocytopenia (HCC) 01/31/2016  . Pyuria 01/31/2016  . Weakness 01/29/2016  . HIV (human immunodeficiency virus infection) (HCC) 01/29/2016  . CAD (coronary artery disease) 01/29/2016  . COPD (chronic obstructive pulmonary disease) (HCC) 01/29/2016  . Diabetes mellitus (HCC) 01/29/2016  . Leg  weakness, bilateral 01/13/2016  . Chest pain 06/24/2015    Orientation RESPIRATION BLADDER Height & Weight     Self, Time, Situation, Place  Normal Incontinent Weight: 170 lb (77.1 kg) Height:   (165.1 cm)  BEHAVIORAL SYMPTOMS/MOOD NEUROLOGICAL BOWEL NUTRITION STATUS      Incontinent Diet (Diet: Carb Modified )  AMBULATORY STATUS COMMUNICATION OF NEEDS Skin   Extensive Assist Verbally Normal                       Personal Care Assistance Level of Assistance  Bathing, Feeding, Dressing Bathing Assistance: Limited assistance Feeding assistance: Independent Dressing Assistance: Limited assistance     Functional Limitations Info  Sight, Hearing, Speech Sight Info: Adequate Hearing Info: Adequate Speech Info: Adequate    SPECIAL CARE FACTORS FREQUENCY  PT (By licensed PT), OT (By licensed OT)     PT Frequency:  (5) OT Frequency:  (5)            Contractures      Additional Factors Info  Code Status, Allergies, Isolation Precautions Code Status Info:  (Full Code. ) Allergies Info:  (Aspirin, Bee Venom, Penicillins, Prednisone, Sulfa Antibiotics, Sulfasalazine, Theophyllines, Theophylline)     Isolation Precautions Info:  (Enteric precautions cdiff. )     Current Medications (05/03/2017):  This is the current hospital active medication list Current Facility-Administered Medications  Medication Dose Route Frequency Provider Last Rate Last Dose  . abacavir-dolutegravir-lamiVUDine (TRIUMEQ) 600-50-300 MG per tablet 1 tablet  1 tablet Oral QPC breakfast Willy Eddy, MD   1 tablet at 05/03/17 1203  . acetaminophen (TYLENOL) tablet 650 mg  650 mg Oral Q6H PRN Milagros Loll, MD       Or  . acetaminophen (TYLENOL) suppository 650 mg  650 mg Rectal Q6H PRN Sudini, Wardell Heath, MD      . albuterol (PROVENTIL) (2.5 MG/3ML) 0.083% nebulizer solution 2.5 mg  2.5 mg Nebulization TID Milagros Loll, MD   2.5 mg at 05/03/17 1412  . amiodarone (PACERONE) tablet 400 mg   400 mg Oral Daily Caroleen Hamman, FNP   400 mg at 05/03/17 1203  . atorvastatin (LIPITOR) tablet 10 mg  10 mg Oral QPC breakfast Sudini, Wardell Heath, MD      . cyclobenzaprine (FLEXERIL) tablet 5 mg  5 mg Oral TID PRN Willy Eddy, MD      . escitalopram (LEXAPRO) tablet 20 mg  20 mg Oral QPC breakfast Milagros Loll, MD   20 mg at 05/03/17 1204  . fondaparinux (ARIXTRA) injection 7.5 mg  7.5 mg Subcutaneous Q0600 Willy Eddy, MD   7.5 mg at 05/03/17 1416  . HYDROcodone-acetaminophen (NORCO/VICODIN) 5-325 MG per tablet 1-2 tablet  1-2 tablet Oral Q4H PRN Sudini, Srikar, MD      . insulin aspart (novoLOG) injection 0-9 Units  0-9 Units Subcutaneous TID WC Sudini, Srikar, MD      . levothyroxine (SYNTHROID, LEVOTHROID) tablet 50 mcg  50 mcg Oral QAC breakfast Milagros Loll, MD   50 mcg at 05/03/17 1202  . linaclotide (LINZESS) capsule 72 mcg  72 mcg Oral QPC breakfast Milagros Loll, MD   72 mcg at 05/03/17 1202  . magnesium oxide (MAG-OX) tablet 400 mg  400 mg Oral QPC breakfast Milagros Loll, MD   400 mg at 05/03/17 1202  . metoprolol succinate (TOPROL-XL) 24 hr tablet 50 mg  50 mg Oral Daily Caroleen Hamman, FNP   50 mg at 05/03/17 1203  . mirtazapine (REMERON) tablet 15 mg  15 mg Oral QHS Milagros Loll, MD   15 mg at 05/02/17 2318  . mometasone-formoterol (DULERA) 200-5 MCG/ACT inhaler 2 puff  2 puff Inhalation BID Milagros Loll, MD   2 puff at 05/03/17 1212  . morphine 2 MG/ML injection 2 mg  2 mg Intravenous Q4H PRN Milagros Loll, MD      . neomycin-bacitracin-polymyxin (NEOSPORIN) ointment   Topical TID Willy Eddy, MD   1 application at 05/03/17 1203  . ondansetron (ZOFRAN) tablet 4 mg  4 mg Oral Q6H PRN Sudini, Wardell Heath, MD      . pantoprazole (PROTONIX) EC tablet 40 mg  40 mg Oral Daily Milagros Loll, MD   40 mg at 05/03/17 1202  . polyethylene glycol (MIRALAX / GLYCOLAX) packet 17 g  17 g Oral Daily PRN Sudini, Srikar, MD      . potassium chloride (K-DUR,KLOR-CON)  CR tablet 10 mEq  10 mEq Oral Daily Milagros Loll, MD   10 mEq at 05/03/17 1202  . pregabalin (LYRICA) capsule 75 mg  75 mg Oral BID Milagros Loll, MD   75 mg at 05/03/17 1202  . tiotropium (SPIRIVA) inhalation capsule 18 mcg  18 mcg Inhalation Daily Milagros Loll, MD   18 mcg at 05/03/17 1204  . traMADol (ULTRAM) tablet 50 mg  50 mg Oral BID Milagros Loll, MD   50 mg at 05/03/17 1416  . zolpidem (AMBIEN) tablet 5 mg  5 mg Oral QHS Milagros Loll, MD   5 mg at 05/02/17 2317     Discharge Medications: Please see discharge summary for a list of discharge medications.  Relevant Imaging Results:  Relevant Lab Results:  Additional Information  (SSN: [id number])  Sample, Darleen Crocker, LCSW

## 2017-05-03 NOTE — Progress Notes (Signed)
Pt is alert and oriented but impulsive. No complaints of pain this shift. Up to bedside commode with assistance. Stool mixed with urine unable to get sample.

## 2017-05-04 DIAGNOSIS — F05 Delirium due to known physiological condition: Secondary | ICD-10-CM | POA: Diagnosis not present

## 2017-05-04 DIAGNOSIS — M8700 Idiopathic aseptic necrosis of unspecified bone: Secondary | ICD-10-CM | POA: Diagnosis not present

## 2017-05-04 DIAGNOSIS — Y92531 Health care provider office as the place of occurrence of the external cause: Secondary | ICD-10-CM | POA: Diagnosis not present

## 2017-05-04 DIAGNOSIS — M87052 Idiopathic aseptic necrosis of left femur: Secondary | ICD-10-CM

## 2017-05-04 DIAGNOSIS — M87051 Idiopathic aseptic necrosis of right femur: Secondary | ICD-10-CM | POA: Diagnosis present

## 2017-05-04 DIAGNOSIS — Z794 Long term (current) use of insulin: Secondary | ICD-10-CM | POA: Diagnosis not present

## 2017-05-04 DIAGNOSIS — J449 Chronic obstructive pulmonary disease, unspecified: Secondary | ICD-10-CM | POA: Diagnosis present

## 2017-05-04 DIAGNOSIS — Z86711 Personal history of pulmonary embolism: Secondary | ICD-10-CM | POA: Diagnosis not present

## 2017-05-04 DIAGNOSIS — R296 Repeated falls: Secondary | ICD-10-CM | POA: Diagnosis present

## 2017-05-04 DIAGNOSIS — S2241XA Multiple fractures of ribs, right side, initial encounter for closed fracture: Secondary | ICD-10-CM | POA: Diagnosis present

## 2017-05-04 DIAGNOSIS — E1151 Type 2 diabetes mellitus with diabetic peripheral angiopathy without gangrene: Secondary | ICD-10-CM | POA: Diagnosis present

## 2017-05-04 DIAGNOSIS — M25552 Pain in left hip: Secondary | ICD-10-CM | POA: Diagnosis not present

## 2017-05-04 DIAGNOSIS — A0472 Enterocolitis due to Clostridium difficile, not specified as recurrent: Secondary | ICD-10-CM | POA: Diagnosis present

## 2017-05-04 DIAGNOSIS — F0280 Dementia in other diseases classified elsewhere without behavioral disturbance: Secondary | ICD-10-CM | POA: Diagnosis present

## 2017-05-04 DIAGNOSIS — S80211A Abrasion, right knee, initial encounter: Secondary | ICD-10-CM | POA: Diagnosis present

## 2017-05-04 DIAGNOSIS — Z21 Asymptomatic human immunodeficiency virus [HIV] infection status: Secondary | ICD-10-CM | POA: Diagnosis not present

## 2017-05-04 DIAGNOSIS — Z7951 Long term (current) use of inhaled steroids: Secondary | ICD-10-CM | POA: Diagnosis not present

## 2017-05-04 DIAGNOSIS — I252 Old myocardial infarction: Secondary | ICD-10-CM | POA: Diagnosis not present

## 2017-05-04 DIAGNOSIS — I10 Essential (primary) hypertension: Secondary | ICD-10-CM | POA: Diagnosis present

## 2017-05-04 DIAGNOSIS — R55 Syncope and collapse: Secondary | ICD-10-CM | POA: Diagnosis present

## 2017-05-04 DIAGNOSIS — Z7401 Bed confinement status: Secondary | ICD-10-CM | POA: Diagnosis not present

## 2017-05-04 DIAGNOSIS — J439 Emphysema, unspecified: Secondary | ICD-10-CM | POA: Diagnosis not present

## 2017-05-04 DIAGNOSIS — Z87891 Personal history of nicotine dependence: Secondary | ICD-10-CM | POA: Diagnosis not present

## 2017-05-04 DIAGNOSIS — R2689 Other abnormalities of gait and mobility: Secondary | ICD-10-CM | POA: Diagnosis not present

## 2017-05-04 DIAGNOSIS — W19XXXA Unspecified fall, initial encounter: Secondary | ICD-10-CM | POA: Diagnosis present

## 2017-05-04 DIAGNOSIS — I4891 Unspecified atrial fibrillation: Secondary | ICD-10-CM | POA: Diagnosis present

## 2017-05-04 DIAGNOSIS — B20 Human immunodeficiency virus [HIV] disease: Secondary | ICD-10-CM | POA: Diagnosis present

## 2017-05-04 DIAGNOSIS — E1122 Type 2 diabetes mellitus with diabetic chronic kidney disease: Secondary | ICD-10-CM | POA: Diagnosis not present

## 2017-05-04 DIAGNOSIS — I2510 Atherosclerotic heart disease of native coronary artery without angina pectoris: Secondary | ICD-10-CM | POA: Diagnosis present

## 2017-05-04 DIAGNOSIS — Z86718 Personal history of other venous thrombosis and embolism: Secondary | ICD-10-CM | POA: Diagnosis not present

## 2017-05-04 DIAGNOSIS — M6281 Muscle weakness (generalized): Secondary | ICD-10-CM | POA: Diagnosis not present

## 2017-05-04 DIAGNOSIS — I82401 Acute embolism and thrombosis of unspecified deep veins of right lower extremity: Secondary | ICD-10-CM | POA: Diagnosis not present

## 2017-05-04 DIAGNOSIS — S2239XA Fracture of one rib, unspecified side, initial encounter for closed fracture: Secondary | ICD-10-CM | POA: Diagnosis not present

## 2017-05-04 LAB — CBC
HEMATOCRIT: 34.5 % — AB (ref 40.0–52.0)
Hemoglobin: 11.7 g/dL — ABNORMAL LOW (ref 13.0–18.0)
MCH: 34.2 pg — AB (ref 26.0–34.0)
MCHC: 33.9 g/dL (ref 32.0–36.0)
MCV: 100.8 fL — ABNORMAL HIGH (ref 80.0–100.0)
Platelets: 263 10*3/uL (ref 150–440)
RBC: 3.42 MIL/uL — ABNORMAL LOW (ref 4.40–5.90)
RDW: 15.7 % — ABNORMAL HIGH (ref 11.5–14.5)
WBC: 8.5 10*3/uL (ref 3.8–10.6)

## 2017-05-04 LAB — BLOOD GAS, VENOUS
Acid-Base Excess: 3.7 mmol/L — ABNORMAL HIGH (ref 0.0–2.0)
Bicarbonate: 28.5 mmol/L — ABNORMAL HIGH (ref 20.0–28.0)
FIO2: 0.21
PCO2 VEN: 43 mmHg — AB (ref 44.0–60.0)
Patient temperature: 37
pH, Ven: 7.43 (ref 7.250–7.430)

## 2017-05-04 LAB — GLUCOSE, CAPILLARY
GLUCOSE-CAPILLARY: 76 mg/dL (ref 65–99)
GLUCOSE-CAPILLARY: 99 mg/dL (ref 65–99)
GLUCOSE-CAPILLARY: 71 mg/dL (ref 65–99)
Glucose-Capillary: 67 mg/dL (ref 65–99)

## 2017-05-04 MED ORDER — DOXYCYCLINE HYCLATE 100 MG PO TABS
100.0000 mg | ORAL_TABLET | Freq: Two times a day (BID) | ORAL | Status: DC
  Administered 2017-05-04 – 2017-05-06 (×4): 100 mg via ORAL
  Filled 2017-05-04 (×4): qty 1

## 2017-05-04 NOTE — Progress Notes (Signed)
SUBJECTIVE: Pt continues to state he is having pain in his ribs and left hip. He is much more confused today.   Vitals:   05/03/17 1950 05/03/17 2021 05/04/17 0358 05/04/17 0803  BP:  132/70  113/73  Pulse:  86  87  Resp:  18    Temp:  97.9 F (36.6 C)  (!) 97.5 F (36.4 C)  TempSrc:  Oral  Oral  SpO2: 98% 97%  96%  Weight:   183 lb 11.2 oz (83.3 kg)   Height:       No intake or output data in the 24 hours ending 05/04/17 0837  LABS: Basic Metabolic Panel:  Recent Labs  16/10/96 1415 05/03/17 0747  NA 138 141  K 3.1* 3.5  CL 102 107  CO2 24 26  GLUCOSE 113* 79  BUN 17 14  CREATININE 1.31* 1.25*  CALCIUM 8.2* 8.4*  MG  --  2.1   Liver Function Tests:  Recent Labs  05/01/17 1248 05/02/17 1415  AST 27 22  ALT 16* 17  ALKPHOS 196* 156*  BILITOT 1.3* 1.0  PROT 7.5 6.5  ALBUMIN 3.0* 2.6*   No results for input(s): LIPASE, AMYLASE in the last 72 hours. CBC:  Recent Labs  05/01/17 1149  05/03/17 0747 05/04/17 0734  WBC 12.3*  < > 7.9 8.5  NEUTROABS 10.3*  --   --   --   HGB 12.3*  < > 10.9* 11.7*  HCT 36.4*  < > 31.7* 34.5*  MCV 99.6  < > 101.4* 100.8*  PLT 301  < > 245 263  < > = values in this interval not displayed. Cardiac Enzymes:  Recent Labs  05/01/17 1248 05/01/17 1848  CKTOTAL 319  --   TROPONINI 0.03* <0.03   BNP: Invalid input(s): POCBNP D-Dimer: No results for input(s): DDIMER in the last 72 hours. Hemoglobin A1C: No results for input(s): HGBA1C in the last 72 hours. Fasting Lipid Panel: No results for input(s): CHOL, HDL, LDLCALC, TRIG, CHOLHDL, LDLDIRECT in the last 72 hours. Thyroid Function Tests: No results for input(s): TSH, T4TOTAL, T3FREE, THYROIDAB in the last 72 hours.  Invalid input(s): FREET3 Anemia Panel: No results for input(s): VITAMINB12, FOLATE, FERRITIN, TIBC, IRON, RETICCTPCT in the last 72 hours.   PHYSICAL EXAM General: Pale, bleeding wounds on arms and knees HEENT:  Normocephalic and atramatic Neck:   No JVD.  Lungs: Clear bilaterally to auscultation and percussion. Heart: HRRR . Normal S1 and S2 without gallops or murmurs.  Abdomen: Bowel sounds are positive, abdomen soft and non-tender  Msk:  Back normal, normal gait. Normal strength and tone for age. Extremities: No clubbing, cyanosis or edema.   Neuro: Confused, speech slurred, talking and pointing to the air. Psych:  Disoriented and confused, eyes half closed. TELEMETRY: NSR 88bpm  ASSESSMENT AND PLAN: History of syncope and collapse with history of atrial fibrillation converted with amiodarone. Rate controlled with metoprolol. Blood pressure stable, potassium WNL. No changes for cardiac medication. Newly diagnosed with HIV dementia. Needs to find long term care before he can be discharged as he cannot live alone due to frequent falls and dementia.   Principal Problem:   HIV dementia [facility]) Active Problems:   Falls    Caroleen Hamman, NP-C 05/04/2017 8:37 AM

## 2017-05-04 NOTE — Care Management (Signed)
Patient is at 41 hours observation stay. No payer for SNF at this time and patient refusing long-term placement. Home health in place already with [facility] home health; palliative at home also following.

## 2017-05-04 NOTE — Progress Notes (Signed)
Physical Therapy Treatment Patient Details Name: Brian Hampton MRN: [id number] DOB: 1955/10/27 Today's Date: 05/04/2017    History of Present Illness 61 y.o. male with a known history of anemia, bronchial asthma, collagen vascular disease, COPD, coronary artery disease, recurrent DVT, hypertension, HIV infection, PE.  He is here with multiple frequent falls with multiple rib fractures, possible further adressing of L hip ANV.    PT Comments    Pt agrees to work with therapy on this date. He is able to complete all supine exercises as instructed. Pt requires modA+1 for sit to stand transfer due to poor balance with transfers. Once upright he requires intermittent minA+1 to stablize but is mostly able to remain standing with CGA only. Pt is able to ambulate from bed to door and back twice with therapist. Pt is unsteady with ambulation requiring intermittent minA+1 to stabilize especially during turns. No signs of respiratory distress from patient. Gait speed is decreased but functional for limited mobility. Pt will need SNF placement at discharge. Pt will benefit from PT services to address deficits in strength, balance, and mobility in order to return to full function at home.    Follow Up Recommendations  SNF     Equipment Recommendations  Other (comment) (TBD at next venus)    Recommendations for Other Services       Precautions / Restrictions Precautions Precautions: Fall Restrictions Weight Bearing Restrictions: Yes    Mobility  Bed Mobility Overal bed mobility: Modified Independent Bed Mobility: Supine to Sit           General bed mobility comments: HOB elevated and bed rail utilized. Increased time and effort required to perform but no external assist from therapist  Transfers Overall transfer level: Needs assistance Equipment used: Rolling walker (2 wheeled) Transfers: Sit to/from Stand Sit to Stand: Mod assist         General transfer comment: Pt requires modA+1  for sit to stand transfer due to poor balance with transfers. Once upright he requires intermittent minA+1 to stablize but is mostly able to remain standing with CGA only  Ambulation/Gait Ambulation/Gait assistance: Min assist Ambulation Distance (Feet): 40 Feet (20+20) Assistive device: Rolling walker (2 wheeled) Gait Pattern/deviations: Decreased step length - right;Decreased step length - left Gait velocity: Decreased Gait velocity interpretation: <1.8 ft/sec, indicative of risk for recurrent falls General Gait Details: Pt is able to ambulate from bed to door and back twice with therapist. Pt is unsteady with ambulation requiring intermittent minA+1 to stabilize especially during turns. No signs of respiratory distress from patient. Gait speed is decreased but functional for limited mobility   Stairs            Wheelchair Mobility    Modified Rankin (Stroke Patients Only)       Balance Overall balance assessment: Needs assistance Sitting-balance support: No upper extremity supported Sitting balance-Leahy Scale: Fair     Standing balance support: Bilateral upper extremity supported Standing balance-Leahy Scale: Poor Standing balance comment: Intermittent support required on walker and from therapist to stabilize                            Cognition Arousal/Alertness: Awake/alert Behavior During Therapy: WFL for tasks assessed/performed Overall Cognitive Status: History of cognitive impairments - at baseline  General Comments: AOx2 at time of PT treatment. Disoriented to location      Exercises General Exercises - Lower Extremity Ankle Circles/Pumps: AROM;Both;10 reps;Supine Quad Sets: Strengthening;Both;10 reps;Supine Gluteal Sets: Strengthening;Both;10 reps;Supine Short Arc Quad: Strengthening;Both;10 reps;Supine Long Arc Quad: Strengthening;Both;10 reps;Seated Heel Slides: Strengthening;Both;10  reps;Seated Hip ABduction/ADduction: Strengthening;Both;10 reps;Supine;Other (comment) (Performed x 10 in sitting as well) Straight Leg Raises: Strengthening;Both;10 reps;Seated Hip Flexion/Marching: Strengthening;Both;10 reps;Seated Heel Raises: Strengthening;Both;10 reps;Seated    General Comments        Pertinent Vitals/Pain Pain Assessment: 0-10 Pain Score: 6  Pain Location: L hip Pain Descriptors / Indicators: Aching Pain Intervention(s): Monitored during session    Home Living                      Prior Function            PT Goals (current goals can now be found in the care plan section) Acute Rehab PT Goals Patient Stated Goal: have hip surgery ASAP PT Goal Formulation: With patient Time For Goal Achievement: 05/17/17 Potential to Achieve Goals: Fair Progress towards PT goals: Progressing toward goals    Frequency    Min 2X/week      PT Plan Current plan remains appropriate    Co-evaluation              AM-PAC PT "6 Clicks" Daily Activity  Outcome Measure  Difficulty turning over in bed (including adjusting bedclothes, sheets and blankets)?: A Little Difficulty moving from lying on back to sitting on the side of the bed? : A Little Difficulty sitting down on and standing up from a chair with arms (e.g., wheelchair, bedside commode, etc,.)?: Unable Help needed moving to and from a bed to chair (including a wheelchair)?: A Little Help needed walking in hospital room?: A Little Help needed climbing 3-5 steps with a railing? : A Lot 6 Click Score: 15    End of Session Equipment Utilized During Treatment: Gait belt Activity Tolerance: Patient tolerated treatment well Patient left: in bed;with call bell/phone within reach;with bed alarm set;Other (comment) (Refuses offer to get up to recliner)   PT Visit Diagnosis: Muscle weakness (generalized) (M62.81);Difficulty in walking, not elsewhere classified (R26.2);Pain Pain - Right/Left:  Left Pain - part of body: Hip     Time: 1610-9604 PT Time Calculation (min) (ACUTE ONLY): 24 min  Charges:  $Gait Training: 8-22 mins $Therapeutic Exercise: 8-22 mins                    G Codes:       Sharalyn Ink Pearle Wandler PT, DPT     Rossi Burdo 05/04/2017, 1:50 PM

## 2017-05-04 NOTE — Consult Note (Signed)
[facility] Infectious Disease     Reason for Consult:HIV   Referring Physician: Rudene Christians Date of Admission:  05/02/2017   Principal Problem:   HIV dementia [facility] [place]) Active Problems:   Falls   Avascular necrosis of bones of both hips ([place])   HPI: Brian Hampton is a 61 y.o. male admitted with syncope at cardiologists office. CT shows Hip AVN, and rib fxs. He has hx of well controlled HIV as well as other issues such as COPD, HTN, prior CAD. He has had recurrent DVTs as well and has been following with oncology and vascular.  I am asked to comment on readiness for surgery for Hip AVN from HIV viewpoint.  He is well known to me and today is able to tell me who I am, where he is and the date. He has occaional confusion but lives with his sister and is somewhat independent.   Past Medical History:  Diagnosis Date  . Anemia   . Asthma   . Cataracts, bilateral    worse in Rt eye  . Collagen vascular disease ([place])   . COPD (chronic obstructive pulmonary disease) ([place])   . Coronary artery disease   . Depression   . DVT (deep venous thrombosis) ([place])   . Dysrhythmia   . Emphysema/COPD ([place])   . Environmental and seasonal allergies   . H/O blood clots   . Heart murmur   . HIV (human immunodeficiency virus infection) ([place])   . Hypertension   . Leaky heart valve    x 3  . Lung mass   . Myocardial infarction ([place])    in 2000  . Pneumonia    year ago  . Pulmonary emboli ([place])   . Type 2 diabetes mellitus ([place])    Past Surgical History:  Procedure Laterality Date  . ANKLE ARTHROSCOPY    . IVC FILTER INSERTION    . KYPHOPLASTY N/A 12/21/2016   Procedure: KYPHOPLASTY;  Surgeon: Hessie Knows, MD;  Location: [facility] ORS;  Service: Orthopedics;  Laterality: N/A;  . PERIPHERAL VASCULAR CATHETERIZATION N/A 03/16/2016   Procedure: IVC Filter Insertion;  Surgeon: Katha Cabal, MD;  Location: [facility];  Service: Cardiovascular;  Laterality: N/A;  . SINUS EXPLORATION    . WRIST  ARTHROSCOPY Right    Social History  Substance Use Topics  . Smoking status: Former Smoker    Quit date: 12/16/2001  . Smokeless tobacco: Never Used  . Alcohol use No   Family History  Problem Relation Age of Onset  . CAD Unknown   . Asthma Mother   . Cirrhosis Father   . Deep vein thrombosis Neg Hx   . Diabetes Neg Hx     Allergies:  Allergies  Allergen Reactions  . Aspirin Anaphylaxis  . Bee Venom Anaphylaxis  . Penicillins Anaphylaxis and Other (See Comments)    Has patient had a PCN reaction causing immediate rash, facial/tongue/throat swelling, SOB or lightheadedness with hypotension: Yes Has patient had a PCN reaction causing severe rash involving mucus membranes or skin necrosis: No Has patient had a PCN reaction that required hospitalization No Has patient had a PCN reaction occurring within the last 10 years: Yes If all of the above answers are "NO", then may proceed with Cephalosporin use.  . Prednisone Anaphylaxis  . Sulfa Antibiotics Anaphylaxis  . Sulfasalazine Anaphylaxis  . Theophyllines Anaphylaxis  . Theophylline Swelling    Current antibiotics: Antibiotics Given (last 72 hours)    Date/Time Action Medication Dose  05/03/17 1203 Given   abacavir-dolutegravir-lamiVUDine (TRIUMEQ) 981-19-147 MG per tablet 1 tablet 1 tablet   05/04/17 1109 Given   abacavir-dolutegravir-lamiVUDine (TRIUMEQ) 600-50-300 MG per tablet 1 tablet 1 tablet      MEDICATIONS: . abacavir-dolutegravir-lamiVUDine  1 tablet Oral QPC breakfast  . albuterol  2.5 mg Nebulization TID  . amiodarone  400 mg Oral Daily  . atorvastatin  10 mg Oral QPC breakfast  . escitalopram  20 mg Oral QPC breakfast  . fondaparinux (ARIXTRA) injection  7.5 mg Subcutaneous Q0600  . insulin aspart  0-9 Units Subcutaneous TID WC  . levothyroxine  50 mcg Oral QAC breakfast  . linaclotide  72 mcg Oral QPC breakfast  . magnesium oxide  400 mg Oral QPC breakfast  . metoprolol succinate  50 mg Oral Daily   . mirtazapine  15 mg Oral QHS  . mometasone-formoterol  2 puff Inhalation BID  . neomycin-bacitracin-polymyxin   Topical TID  . pantoprazole  40 mg Oral Daily  . potassium chloride  10 mEq Oral Daily  . pregabalin  75 mg Oral BID  . tiotropium  18 mcg Inhalation Daily  . traMADol  50 mg Oral BID  . zolpidem  5 mg Oral QHS    Review of Systems - 11 systems reviewed and negative per HPI   OBJECTIVE: Temp:  [97.5 F (36.4 C)-97.9 F (36.6 C)] 97.5 F (36.4 C) (09/26 0803) Pulse Rate:  [86-87] 87 (09/26 0803) Resp:  [18] 18 (09/25 2021) BP: (113-132)/(70-73) 113/73 (09/26 0803) SpO2:  [95 %-98 %] 96 % (09/26 0803) Weight:  [83.3 kg (183 lb 11.2 oz)] 83.3 kg (183 lb 11.2 oz) (09/26 0358) Physical Exam  Constitutional: He is oriented to person, place, and time. Disheveled, chronically ill appearing HENT: anicteric Mouth/Throat: Oropharynx is clear and moist. No oropharyngeal exudate.  Cardiovascular: Normal rate, regular rhythm and normal heart sounds. Pulmonary/Chest: Effort normal and breath sounds normal. No respiratory distress. He has no wheezes.  Abdominal: Soft. Bowel sounds are normal. He exhibits no distension. There is no tenderness.  Lymphadenopathy: He has no cervical adenopathy.  Neurological: He is alert and oriented to person, place, and time.  Skin: Skin is warm and dry. R knee abrasion with some purulence Psychiatric: He has a normal mood and affect. His behavior is normal.     LABS: Results for orders placed or performed during the hospital encounter of 05/02/17 (from the past 48 hour(s))  Ammonia     Status: None   Collection Time: 05/02/17  4:31 PM  Result Value Ref Range   Ammonia 10 9 - 35 umol/L  Type and screen [place]     Status: None   Collection Time: 05/02/17  4:31 PM  Result Value Ref Range   ABO/RH(D) A POS    Antibody Screen NEG    Sample Expiration 05/05/2017   Protime-INR     Status: None   Collection Time:  05/02/17  4:31 PM  Result Value Ref Range   Prothrombin Time 15.0 11.4 - 15.2 seconds   INR 1.19   Blood gas, venous     Status: Abnormal   Collection Time: 05/02/17  4:31 PM  Result Value Ref Range   FIO2 0.21    pH, Ven 7.43 7.250 - 7.430   pCO2, Ven 43 (L) 44.0 - 60.0 mmHg   pO2, Ven <31.0 (LL) 32.0 - 45.0 mmHg    Comment: CRITICAL RESULT CALLED TO, READ BACK BY AND VERIFIED WITH:  DR Quentin Cornwall  AT 1650, 05/02/2017, LS    Bicarbonate 28.5 (H) 20.0 - 28.0 mmol/L   Acid-Base Excess 3.7 (H) 0.0 - 2.0 mmol/L   Patient temperature 37.0    Collection site VENOUS    Sample type VENOUS   Lactic acid, plasma     Status: None   Collection Time: 05/02/17  7:02 PM  Result Value Ref Range   Lactic Acid, Venous 1.1 0.5 - 1.9 mmol/L  Urinalysis, Complete w Microscopic     Status: Abnormal   Collection Time: 05/02/17  8:25 PM  Result Value Ref Range   Color, Urine YELLOW (A) YELLOW   APPearance CLEAR (A) CLEAR   Specific Gravity, Urine 1.017 1.005 - 1.030   pH 5.0 5.0 - 8.0   Glucose, UA NEGATIVE NEGATIVE mg/dL   Hgb urine dipstick NEGATIVE NEGATIVE   Bilirubin Urine NEGATIVE NEGATIVE   Ketones, ur 5 (A) NEGATIVE mg/dL   Protein, ur 30 (A) NEGATIVE mg/dL   Nitrite NEGATIVE NEGATIVE   Leukocytes, UA NEGATIVE NEGATIVE   RBC / HPF 0-5 0 - 5 RBC/hpf   WBC, UA 0-5 0 - 5 WBC/hpf   Bacteria, UA NONE SEEN NONE SEEN   Squamous Epithelial / LPF NONE SEEN NONE SEEN   Mucus PRESENT   Glucose, capillary     Status: None   Collection Time: 05/02/17 10:26 PM  Result Value Ref Range   Glucose-Capillary 90 65 - 99 mg/dL   Comment 1 Notify RN   MRSA PCR Screening     Status: None   Collection Time: 05/03/17  4:54 AM  Result Value Ref Range   MRSA by PCR NEGATIVE NEGATIVE    Comment:        The GeneXpert MRSA Assay (FDA approved for NASAL specimens only), is one component of a comprehensive MRSA colonization surveillance program. It is not intended to diagnose MRSA infection nor to guide  or monitor treatment for MRSA infections.   Basic metabolic panel     Status: Abnormal   Collection Time: 05/03/17  7:47 AM  Result Value Ref Range   Sodium 141 135 - 145 mmol/L   Potassium 3.5 3.5 - 5.1 mmol/L   Chloride 107 101 - 111 mmol/L   CO2 26 22 - 32 mmol/L   Glucose, Bld 79 65 - 99 mg/dL   BUN 14 6 - 20 mg/dL   Creatinine, Ser 1.25 (H) 0.61 - 1.24 mg/dL   Calcium 8.4 (L) 8.9 - 10.3 mg/dL   GFR calc non Af Amer >60 >60 mL/min   GFR calc Af Amer >60 >60 mL/min    Comment: (NOTE) The eGFR has been calculated using the CKD EPI equation. This calculation has not been validated in all clinical situations. eGFR's persistently <60 mL/min signify possible Chronic Kidney Disease.    Anion gap 8 5 - 15  CBC     Status: Abnormal   Collection Time: 05/03/17  7:47 AM  Result Value Ref Range   WBC 7.9 3.8 - 10.6 K/uL   RBC 3.13 (L) 4.40 - 5.90 MIL/uL   Hemoglobin 10.9 (L) 13.0 - 18.0 g/dL   HCT 31.7 (L) 40.0 - 52.0 %   MCV 101.4 (H) 80.0 - 100.0 fL   MCH 34.9 (H) 26.0 - 34.0 pg   MCHC 34.5 32.0 - 36.0 g/dL   RDW 15.9 (H) 11.5 - 14.5 %   Platelets 245 150 - 440 K/uL  Magnesium     Status: None   Collection Time: 05/03/17  7:47  AM  Result Value Ref Range   Magnesium 2.1 1.7 - 2.4 mg/dL  Glucose, capillary     Status: None   Collection Time: 05/03/17  7:53 AM  Result Value Ref Range   Glucose-Capillary 70 65 - 99 mg/dL  Glucose, capillary     Status: None   Collection Time: 05/03/17 11:51 AM  Result Value Ref Range   Glucose-Capillary 88 65 - 99 mg/dL  Glucose, capillary     Status: Abnormal   Collection Time: 05/03/17  4:38 PM  Result Value Ref Range   Glucose-Capillary 106 (H) 65 - 99 mg/dL  CBC     Status: Abnormal   Collection Time: 05/04/17  7:34 AM  Result Value Ref Range   WBC 8.5 3.8 - 10.6 K/uL   RBC 3.42 (L) 4.40 - 5.90 MIL/uL   Hemoglobin 11.7 (L) 13.0 - 18.0 g/dL   HCT 11.4 (L) 11.2 - 59.3 %   MCV 100.8 (H) 80.0 - 100.0 fL   MCH 34.2 (H) 26.0 - 34.0 pg    MCHC 33.9 32.0 - 36.0 g/dL   RDW 27.2 (H) 88.8 - 99.0 %   Platelets 263 150 - 440 K/uL  Glucose, capillary     Status: None   Collection Time: 05/04/17  7:57 AM  Result Value Ref Range   Glucose-Capillary 76 65 - 99 mg/dL  Glucose, capillary     Status: None   Collection Time: 05/04/17 11:59 AM  Result Value Ref Range   Glucose-Capillary 99 65 - 99 mg/dL   No components found for: ESR, C REACTIVE PROTEIN MICRO: Recent Results (from the past 720 hour(s))  MRSA PCR Screening     Status: None   Collection Time: 05/03/17  4:54 AM  Result Value Ref Range Status   MRSA by PCR NEGATIVE NEGATIVE Final    Comment:        The GeneXpert MRSA Assay (FDA approved for NASAL specimens only), is one component of a comprehensive MRSA colonization surveillance program. It is not intended to diagnose MRSA infection nor to guide or monitor treatment for MRSA infections.     IMAGING: Dg Chest 1 View  Result Date: 05/02/2017 CLINICAL DATA:  Near syncopal episode EXAM: CHEST 1 VIEW COMPARISON:  05/01/2017, 04/26/2017, CT chest 03/30/2017 FINDINGS: Linear atelectasis or scar at the left base. No consolidation or effusion. Normal cardiomediastinal silhouette. No pneumothorax. Old left fifth rib fracture. Possible acute right sixth and seventh rib fractures. IMPRESSION: 1. Negative for pleural effusion or pneumothorax 2. Probable acute right sixth and seventh rib fractures Electronically Signed   By: Jasmine Pang M.D.   On: 05/02/2017 16:11   Dg Chest 2 View  Result Date: 05/01/2017 CLINICAL DATA:  [place] last evening. EXAM: CHEST  2 VIEW COMPARISON:  04/26/2017 FINDINGS: The cardiac silhouette, mediastinal and hilar contours are within normal limits and stable. Mild tortuosity of the thoracic aorta. The lungs are clear of an acute process. No pleural effusions or pneumothorax. The bony thorax is intact. Remote healed rib fractures are noted bilaterally. Remote vertebral augmentation changes in the  midthoracic spine and remote compression deformities. IMPRESSION: No acute cardiopulmonary findings. No acute bony findings.  Chronic bony changes. Electronically Signed   By: Rudie Meyer M.D.   On: 05/01/2017 12:51   Dg Chest 2 View  Result Date: 04/26/2017 CLINICAL DATA:  Fall with chest pain EXAM: CHEST  2 VIEW COMPARISON:  03/22/2017, CT 03/30/2017 FINDINGS: No acute consolidation or effusion. Minimal atelectasis or scar at  the left base. Normal cardiomediastinal silhouette. No pneumothorax. Stable wedging deformity of midthoracic vertebra. IVC filter in the upper abdomen. Old left fifth rib fracture. IMPRESSION: No active cardiopulmonary disease. Electronically Signed   By: Donavan Foil M.D.   On: 04/26/2017 21:04   Dg Pelvis 1-2 Views  Result Date: 05/01/2017 CLINICAL DATA:  Per EMS pt comes from home (a boarding house) and fell last night 8:00. Pt was unable to get up and called EMS this morning for help. Pt has abrasions on both knees and states generalized pain Hx/o asthma,COPD, DVT, CAD, HIV,.*comment was truncated* EXAM: PELVIS - 1-2 VIEW COMPARISON:  None. FINDINGS: There is no evidence of pelvic fracture or diastasis. No pelvic bone lesions are seen. IMPRESSION: No pelvic fracture. Electronically Signed   By: Suzy Bouchard M.D.   On: 05/01/2017 12:49   Ct Head Wo Contrast  Result Date: 05/02/2017 CLINICAL DATA:  61 year old male with near syncopal event at doctor's office today. EXAM: CT HEAD WITHOUT CONTRAST TECHNIQUE: Contiguous axial images were obtained from the base of the skull through the vertex without intravenous contrast. COMPARISON:  Head CT 04/26/2017. FINDINGS: Brain: Patchy and confluent areas of decreased attenuation are noted throughout the deep and periventricular white matter of the cerebral hemispheres bilaterally, compatible with chronic microvascular ischemic disease. No evidence of acute infarction, hemorrhage, hydrocephalus, extra-axial collection or mass  lesion/mass effect. Vascular: No hyperdense vessel or unexpected calcification. Skull: Lucent area in the right frontal bone stable compared to numerous prior examinations dating back to 01/29/2016, nonspecific but presumably benign (potentially an area of fibrous dysplasia). Negative for fracture. Sinuses/Orbits: No acute finding. Other: None. IMPRESSION: 1. No acute intracranial abnormalities. 2. Chronic microvascular ischemic changes in the cerebral white matter redemonstrated, as above. Electronically Signed   By: Vinnie Langton M.D.   On: 05/02/2017 15:43   Ct Head Wo Contrast  Result Date: 04/26/2017 CLINICAL DATA:  Head trauma, history of a fall left arm and leg pain EXAM: CT HEAD WITHOUT CONTRAST CT CERVICAL SPINE WITHOUT CONTRAST TECHNIQUE: Multidetector CT imaging of the head and cervical spine was performed following the standard protocol without intravenous contrast. Multiplanar CT image reconstructions of the cervical spine were also generated. COMPARISON:  MRI 03/14/2017, CT brain 03/13/2017, cervical spine CT 01/29/2016 FINDINGS: CT HEAD FINDINGS Brain: No acute territorial infarction, hemorrhage, or intracranial mass is visualized. Scattered hypodensities within the periventricular and deep white matter consistent with small vessel ischemic changes. Mild atrophy. Stable ventricle size. Vascular: No hyperdense vessels. Scattered calcifications at the carotid siphons. Skull: No depressed skull fracture.  Clear mastoids. Sinuses/Orbits: Extensive postsurgical changes of the maxillary, ethmoid and sphenoid sinuses. No acute orbital abnormality. Other: None CT CERVICAL SPINE FINDINGS Alignment: Trace retrolisthesis of C3 on C4 as before. Facet alignment is within normal limits. Skull base and vertebrae: Hypoplastic appearing occipital condyles. No fracture visualized. 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 Soft tissues and spinal canal: No prevertebral fluid or swelling. No visible  canal hematoma. Disc levels:  Mild diffuse degenerative changes. Upper chest: Lung apices are clear.  No thyroid mass. Other: None IMPRESSION: 1. No CT evidence for acute intracranial abnormality. Stable small vessel ischemic changes of the white matter 2. Stable cervical spine alignment. No definite acute osseous abnormality Electronically Signed   By: Donavan Foil M.D.   On: 04/26/2017 20:46   Ct Cervical Spine Wo Contrast  Result Date: 04/26/2017 CLINICAL DATA:  Head trauma, history of a fall left arm and leg pain EXAM:  CT HEAD WITHOUT CONTRAST CT CERVICAL SPINE WITHOUT CONTRAST TECHNIQUE: Multidetector CT imaging of the head and cervical spine was performed following the standard protocol without intravenous contrast. Multiplanar CT image reconstructions of the cervical spine were also generated. COMPARISON:  MRI 03/14/2017, CT brain 03/13/2017, cervical spine CT 01/29/2016 FINDINGS: CT HEAD FINDINGS Brain: No acute territorial infarction, hemorrhage, or intracranial mass is visualized. Scattered hypodensities within the periventricular and deep white matter consistent with small vessel ischemic changes. Mild atrophy. Stable ventricle size. Vascular: No hyperdense vessels. Scattered calcifications at the carotid siphons. Skull: No depressed skull fracture.  Clear mastoids. Sinuses/Orbits: Extensive postsurgical changes of the maxillary, ethmoid and sphenoid sinuses. No acute orbital abnormality. Other: None CT CERVICAL SPINE FINDINGS Alignment: Trace retrolisthesis of C3 on C4 as before. Facet alignment is within normal limits. Skull base and vertebrae: Hypoplastic appearing occipital condyles. No fracture visualized. 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 Soft tissues and spinal canal: No prevertebral fluid or swelling. No visible canal hematoma. Disc levels:  Mild diffuse degenerative changes. Upper chest: Lung apices are clear.  No thyroid mass. Other: None IMPRESSION: 1. No CT evidence  for acute intracranial abnormality. Stable small vessel ischemic changes of the white matter 2. Stable cervical spine alignment. No definite acute osseous abnormality Electronically Signed   By: Donavan Foil M.D.   On: 04/26/2017 20:46   Ct Pelvis Wo Contrast  Result Date: 05/02/2017 CLINICAL DATA:  Patient fell last evening. Generalized as well as left hip pain. EXAM: CT PELVIS WITHOUT CONTRAST TECHNIQUE: Multidetector CT imaging of the pelvis was performed following the standard protocol without intravenous contrast. COMPARISON:  05/01/2017 radiographs, CT 03/04/2017 FINDINGS: Urinary Tract:  Physiologically distended urinary bladder. Bowel: Moderate fecal residue within the rectal vault. Mild rectal wall thickening surrounding the stool ball is noted which may reflect a mild stercoral colitis. Vascular/Lymphatic: Minimal atherosclerosis of the common iliac arteries. No aneurysm. No lymphadenopathy. Reproductive:  No mass or other significant abnormality Other:  None. Musculoskeletal: Slight flattening of the weight-bearing portions of both femoral heads consistent with changes of AVN. Axial joint space narrowing is identified of both hips reveal bone island is noted of the right femoral neck. No acute displaced fracture or joint effusion. No intramuscular hemorrhage or significant muscle atrophy. IMPRESSION: 1. Subtle flattening along the weight-bearing portions of both femoral heads consistent with changes of AVN. 2. No acute displaced appearing fracture or joint dislocation. 3. Moderate fecal residue within the included colon and rectum. Mild rectal wall thickening may be secondary to a a stercoral colitis. Electronically Signed   By: Ashley Royalty M.D.   On: 05/02/2017 18:55   Dg Knee Complete 4 Views Left  Result Date: 05/01/2017 CLINICAL DATA:  [place] last evening.  Left knee pain. EXAM: LEFT KNEE - COMPLETE 4+ VIEW COMPARISON:  04/26/2017 FINDINGS: The joint spaces are maintained. No acute fracture or  osteochondral lesion. No joint effusion. IMPRESSION: No acute bony findings. Electronically Signed   By: Marijo Sanes M.D.   On: 05/01/2017 12:49   Dg Knee Complete 4 Views Left  Result Date: 04/26/2017 CLINICAL DATA:  Left arm and leg pain EXAM: LEFT KNEE - COMPLETE 4+ VIEW COMPARISON:  04/24/2017 FINDINGS: Diffuse soft tissue edema. No acute displaced fracture or malalignment. Trace suprapatellar effusion. IMPRESSION: Diffuse soft tissue edema and trace suprapatellar effusion. No acute osseous abnormality. Electronically Signed   By: Donavan Foil M.D.   On: 04/26/2017 21:00   Dg Knee Complete 4 Views Left  Result Date: 04/25/2017 CLINICAL DATA:  Trip and fall injury.  Bilateral leg pain. EXAM: LEFT KNEE - COMPLETE 4+ VIEW COMPARISON:  None. FINDINGS: No evidence of fracture, dislocation, or joint effusion. No evidence of arthropathy or other focal bone abnormality. Soft tissues are unremarkable. IMPRESSION: Negative. Electronically Signed   By: Lucienne Capers M.D.   On: 04/25/2017 00:08   Dg Knee Complete 4 Views Right  Result Date: 05/01/2017 CLINICAL DATA:  [place] last evening.  Right knee pain. EXAM: RIGHT KNEE - COMPLETE 4+ VIEW COMPARISON:  04/26/2017 FINDINGS: The joint spaces are maintained. No acute fracture, osteochondral lesion or joint effusion. IMPRESSION: No acute bony findings. Electronically Signed   By: Marijo Sanes M.D.   On: 05/01/2017 12:52   Dg Knee Complete 4 Views Right  Result Date: 04/26/2017 CLINICAL DATA:  Fall with pain EXAM: RIGHT KNEE - COMPLETE 4+ VIEW COMPARISON:  04/24/2017 FINDINGS: Diffuse soft tissue swelling. Minimal patellofemoral degenerative change. Trace suprapatellar effusion. No fracture or malalignment. Probable tibial spine osteophytes. IMPRESSION: 1. No acute osseous abnormality 2. Trace suprapatellar effusion Electronically Signed   By: Donavan Foil M.D.   On: 04/26/2017 21:02   Dg Knee Complete 4 Views Right  Result Date: 04/25/2017 CLINICAL  DATA:  Trip and fall injury.  Bilateral leg pain. EXAM: RIGHT KNEE - COMPLETE 4+ VIEW COMPARISON:  None. FINDINGS: No evidence of fracture, dislocation, or joint effusion. No evidence of arthropathy or other focal bone abnormality. Soft tissues are unremarkable. IMPRESSION: Negative. Electronically Signed   By: Lucienne Capers M.D.   On: 04/25/2017 00:08   Dg Hip Unilat W Or Wo Pelvis 2-3 Views Left  Result Date: 05/02/2017 CLINICAL DATA:  Hip pain after syncopal episode EXAM: DG HIP (WITH OR WITHOUT PELVIS) 2-3V LEFT COMPARISON:  04/24/2017 FINDINGS: No fracture or malalignment. Pubic symphysis is intact. Slight flattening and subtle sclerosis at the left greater than right femoral heads. Mild degenerative changes of both hips. IMPRESSION: 1. No acute osseous abnormality 2. Slight flattening and subtle sclerosis in the left greater than right femoral heads suggestive of mild AVN. Electronically Signed   By: Donavan Foil M.D.   On: 05/02/2017 16:12   Dg Hip Unilat W Or Wo Pelvis 2-3 Views Left  Result Date: 04/25/2017 CLINICAL DATA:  Mechanical fall. Upper bilateral leg pain. No loss of consciousness. Trip and fall injury. EXAM: DG HIP (WITH OR WITHOUT PELVIS) 2-3V LEFT COMPARISON:  None. FINDINGS: Mild degenerative changes in the lower lumbar spine and hips. Left hip demonstrates no evidence of acute fracture or dislocation. No focal bone lesion or bone destruction. Pelvis appears intact. SI joints and symphysis pubis are not displaced. Inferior vena caval filter is partially visualized. IMPRESSION: No acute bony abnormalities. Mild degenerative changes in the left hip. Electronically Signed   By: Lucienne Capers M.D.   On: 04/25/2017 00:02   Dg Hip Unilat W Or Wo Pelvis 2-3 Views Right  Result Date: 04/25/2017 CLINICAL DATA:  Mechanical fall.  Bilateral leg pain. EXAM: DG HIP (WITH OR WITHOUT PELVIS) 2-3V RIGHT COMPARISON:  None. FINDINGS: Degenerative changes in the lower lumbar spine and in both  hips. The right hip demonstrates no evidence of acute fracture or dislocation. No focal bone lesion or bone destruction. Pelvis appears intact. SI joints and symphysis pubis are not displaced. An inferior vena caval filter is partially visualized. IMPRESSION: Mild degenerative changes in the right hip. No acute displaced fractures identified. Electronically Signed   By: Oren Beckmann.D.  On: 04/25/2017 00:04   Assessment:   Brian Hampton is a 61 y.o. male with well controlled HIV but also multiple other active issues including CAD,  recurrent DVTs, COPD admitted with syncope and found to have rib fractures and Hip AVN. Last CD4 is 474 and VL < 28 Mar 2017 on Triumeq.    Recommendations From [facility] he is appropriate for surgery as CD4 in normal range and VL is undetectable. He is very compliant with his medications. 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 He has a R knee abrasion with some purulence- I have done a  R knee wound cx - start doxy for now Thank you very much for allowing me to participate in the care of this patient. Please call with questions.   Cheral Marker. Ola Spurr, MD

## 2017-05-04 NOTE — Care Management (Signed)
I talked at length with patient about long-term care (at least 30-days) at a facility. I explained again that this was the only way we could financially provide him with services needed to help him physically get back on his feet. He initially would not agree to SNF placement due to length of stay. He is under Medicare OBS which will not pay for SNF but his Medicaid will. Medicare will help with PT at a facility. He prefers [facility] bed. CSW updated. Per Dr. Rosita Kea note- he would like Dr. Sampson Goon to evaluate patient for medical clearance- consult has been placed.

## 2017-05-04 NOTE — Clinical Social Work Placement (Signed)
   CLINICAL SOCIAL WORK PLACEMENT  NOTE  Date:  05/04/2017  Patient Details  Name: ARTRELL LAWLESS MRN: [id number] Date of Birth: [birth year]  Clinical Social Work is seeking post-discharge placement for this patient at the Skilled  Nursing Facility level of care (*CSW will initial, date and re-position this form in  chart as items are completed):  Yes   Patient/family provided with [place] Clinical Social Work Department's list of facilities offering this level of care within the geographic area requested by the patient (or if unable, by the patient's family).  Yes   Patient/family informed of their freedom to choose among providers that offer the needed level of care, that participate in Medicare, Medicaid or managed care program needed by the patient, have an available bed and are willing to accept the patient.  Yes   Patient/family informed of [facility]'s ownership interest in [facility] and [facility], as well as of the fact that they are under no obligation to receive care at these facilities.  PASRR submitted to EDS on       PASRR number received on       Existing PASRR number confirmed on 05/03/17     FL2 transmitted to all facilities in geographic area requested by pt/family on 05/04/17     FL2 transmitted to all facilities within larger geographic area on       Patient informed that his/her managed care company has contracts with or will negotiate with certain facilities, including the following:        Yes   Patient/family informed of bed offers received.  Patient chooses bed at  [facility]. )     Physician recommends and patient chooses bed at      Patient to be transferred to   on  .  Patient to be transferred to facility by       Patient family notified on   of transfer.  Name of family member notified:        PHYSICIAN       Additional Comment:    _______________________________________________ Janyah Singleterry, Darleen Crocker, LCSW 05/04/2017,  3:11 PM

## 2017-05-04 NOTE — Progress Notes (Signed)
Patient's sister was here for a couple of hours.  She has many concerns about her brother.   She is hypervigilant.  Despite informing her about what has been ruled out she still has rapid fire questions.

## 2017-05-04 NOTE — Progress Notes (Signed)
Per Dr. Toni Amend note on 05/03/17 patient does not have capacity to make decisions for himself. Clinical Child psychotherapist (CSW) attempted to contact patient's sister Williemae Natter to make her aware of above and move forward with SNF placement however she did not answer and a voicemail was left. SNF search has been started.  [employer], LCSW [phone]

## 2017-05-04 NOTE — Progress Notes (Addendum)
[employer] - [place] at [facility]   PATIENT NAME: Brian Hampton    MR#:  [id number]  DATE OF BIRTH:  [birth year]  SUBJECTIVE:  CHIEF COMPLAINT:   Chief Complaint  Patient presents with  . Weakness   The patient is demented, has leg pain. Still diarrhea per nurse. REVIEW OF SYSTEMS:  Review of Systems  Constitutional: Positive for malaise/fatigue. Negative for chills and fever.  HENT: Negative for sore throat.   Eyes: Negative for blurred vision and double vision.  Respiratory: Negative for cough, hemoptysis, shortness of breath, wheezing and stridor.   Cardiovascular: Negative for chest pain, palpitations, orthopnea and leg swelling.  Gastrointestinal: Positive for diarrhea. Negative for abdominal pain, blood in stool, melena, nausea and vomiting.  Genitourinary: Negative for dysuria, flank pain and hematuria.  Musculoskeletal: Positive for joint pain. Negative for back pain.  Neurological: Positive for weakness. Negative for dizziness, sensory change, focal weakness, seizures, loss of consciousness and headaches.  Endo/Heme/Allergies: Negative for polydipsia.  Psychiatric/Behavioral: Negative for depression. The patient is not nervous/anxious.     DRUG ALLERGIES:   Allergies  Allergen Reactions  . Aspirin Anaphylaxis  . Bee Venom Anaphylaxis  . Penicillins Anaphylaxis and Other (See Comments)    Has patient had a PCN reaction causing immediate rash, facial/tongue/throat swelling, SOB or lightheadedness with hypotension: Yes Has patient had a PCN reaction causing severe rash involving mucus membranes or skin necrosis: No Has patient had a PCN reaction that required hospitalization No Has patient had a PCN reaction occurring within the last 10 years: Yes If all of the above answers are "NO", then may proceed with Cephalosporin use.  . Prednisone Anaphylaxis  . Sulfa Antibiotics Anaphylaxis  . Sulfasalazine Anaphylaxis  . Theophyllines Anaphylaxis  .  Theophylline Swelling   VITALS:  Blood pressure 113/73, pulse 87, temperature (!) 97.5 F (36.4 C), temperature source Oral, resp. rate 18, height  (1.651 m), weight 183 lb 11.2 oz (83.3 kg), SpO2 96 %. PHYSICAL EXAMINATION:  Physical Exam  Constitutional: He is oriented to person, place, and time and well-developed, well-nourished, and in no distress.  HENT:  Head: Normocephalic.  Mouth/Throat: Oropharynx is clear and moist.  Eyes: Pupils are equal, round, and reactive to light. Conjunctivae and EOM are normal. No scleral icterus.  Neck: Normal range of motion. Neck supple. No JVD present. No tracheal deviation present.  Cardiovascular: Normal rate, regular rhythm and normal heart sounds.  Exam reveals no gallop.   No murmur heard. Pulmonary/Chest: Effort normal and breath sounds normal. No respiratory distress. He has no wheezes. He has no rales.  Abdominal: Soft. Bowel sounds are normal. He exhibits no distension. There is no tenderness. There is no rebound.  Musculoskeletal: Normal range of motion. He exhibits no edema or tenderness.  Neurological: He is alert and oriented to person, place, and time. No cranial nerve deficit.  Skin: No rash noted. No erythema.  Psychiatric: Affect normal.   LABORATORY PANEL:  Male CBC  Recent Labs Lab 05/04/17 0734  WBC 8.5  HGB 11.7*  HCT 34.5*  PLT 263   ------------------------------------------------------------------------------------------------------------------ Chemistries   Recent Labs Lab 05/02/17 1415 05/03/17 0747  NA 138 141  K 3.1* 3.5  CL 102 107  CO2 24 26  GLUCOSE 113* 79  BUN 17 14  CREATININE 1.31* 1.25*  CALCIUM 8.2* 8.4*  MG  --  2.1  AST 22  --   ALT 17  --   ALKPHOS 156*  --   BILITOT  1.0  --    RADIOLOGY:  No results found. ASSESSMENT AND PLAN:   * Syncope Etiology unclear. Patient had stress test and echocardiogram at his cardiologist's office which were normal. Per Dr. Milta Deiters NP, no  further cardiac workup.  * Acute right sixth and seventh rib fractures. Pain control. Incentive spirometer.  * Cognitive impairment. Patient has been noticed to have memory gaps along with periods of disorientation. Follow up psychiatry for competency evaluation. APS involved in patient's care from the emergency room. Social work consult.  * Hip avascular necrosis.  May need surgery in the future, he needs to be checked by Dr. Sampson Goon prior to consideration for surgery with his immunodeficiency status and meets who he is optimized for elective joint replacement per Dr. Rosita Kea.  * Diabetes mellitus. Continue home medications and sliding scale insulin.  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  * HIV. Continue home medications.  * Dementia. Per psychiatry Dr. Toni Amend, is probably related to his HIV disease. the patient does not have capacity to make the decision in question. Patient has very poor short-term memory and very poor understanding of his care. Would not be able to take care of himself well at all.   Diarrhea. Follow-up C. Difficile test.  PT evaluation. SNF.  All the records are reviewed and case discussed with Care Management/Social Worker. Management plans discussed with the patient's sister long time, all questions are anwsered. CODE STATUS: Full Code  TOTAL TIME TAKING CARE OF THIS PATIENT: 38 minutes.   More than 50% of the time was spent in counseling/coordination of care: YES  POSSIBLE D/C IN 1-2 DAYS, DEPENDING ON CLINICAL CONDITION.   Shaune Pollack M.D on 05/04/2017 at 2:09 PM  Between 7am to 6pm - Pager - [phone]  After 6pm go to [email or web address] - Therapist, nutritional Hospitalists

## 2017-05-04 NOTE — Progress Notes (Signed)
Clinical Child psychotherapist (CSW) contacted patient's sister Williemae Natter and made her aware that patient does not have capacity to make decisions per Psych MD. Sister is agreeable to SNF search and chose [facility] in [place]. Sister has agreed to bring patient's medications to [facility] so his HIV meds can go with him to [facility] in [place]. Val admissions coordinator for [facility] is aware of above and stated that patient will have a a private room. RN is aware of above. CSW will continue to follow and assist as needed.   [employer], LCSW [phone]

## 2017-05-05 LAB — GLUCOSE, CAPILLARY
GLUCOSE-CAPILLARY: 95 mg/dL (ref 65–99)
GLUCOSE-CAPILLARY: 81 mg/dL (ref 65–99)
Glucose-Capillary: 68 mg/dL (ref 65–99)
Glucose-Capillary: 95 mg/dL (ref 65–99)

## 2017-05-05 NOTE — Progress Notes (Addendum)
[employer] - [place] at [facility]   PATIENT NAME: Brian Hampton    MR#:  [id number]  DATE OF BIRTH:  1956-05-18  SUBJECTIVE:  CHIEF COMPLAINT:   Chief Complaint  Patient presents with  . Weakness   The patient is demented, he complained of leg pain on moving his leg. No BM per nurse. REVIEW OF SYSTEMS:  Review of Systems  Constitutional: Positive for malaise/fatigue. Negative for chills and fever.  HENT: Negative for sore throat.   Eyes: Negative for blurred vision and double vision.  Respiratory: Negative for cough, hemoptysis, shortness of breath, wheezing and stridor.   Cardiovascular: Negative for chest pain, palpitations, orthopnea and leg swelling.  Gastrointestinal: Negative for abdominal pain, blood in stool, diarrhea, melena, nausea and vomiting.  Genitourinary: Negative for dysuria, flank pain and hematuria.  Musculoskeletal: Positive for joint pain. Negative for back pain.  Neurological: Positive for weakness. Negative for dizziness, sensory change, focal weakness, seizures, loss of consciousness and headaches.  Endo/Heme/Allergies: Negative for polydipsia.  Psychiatric/Behavioral: Negative for depression. The patient is not nervous/anxious.     DRUG ALLERGIES:   Allergies  Allergen Reactions  . Aspirin Anaphylaxis  . Bee Venom Anaphylaxis  . Penicillins Anaphylaxis and Other (See Comments)    Has patient had a PCN reaction causing immediate rash, facial/tongue/throat swelling, SOB or lightheadedness with hypotension: Yes Has patient had a PCN reaction causing severe rash involving mucus membranes or skin necrosis: No Has patient had a PCN reaction that required hospitalization No Has patient had a PCN reaction occurring within the last 10 years: Yes If all of the above answers are "NO", then may proceed with Cephalosporin use.  . Prednisone Anaphylaxis  . Sulfa Antibiotics Anaphylaxis  . Sulfasalazine Anaphylaxis  . Theophyllines Anaphylaxis   . Theophylline Swelling   VITALS:  Blood pressure 130/79, pulse 91, temperature 97.7 F (36.5 C), temperature source Oral, resp. rate 16, height  (1.651 m), weight 182 lb 9.6 oz (82.8 kg), SpO2 98 %. PHYSICAL EXAMINATION:  Physical Exam  Constitutional: He is well-developed, well-nourished, and in no distress.  HENT:  Head: Normocephalic.  Mouth/Throat: Oropharynx is clear and moist.  Eyes: Pupils are equal, round, and reactive to light. Conjunctivae and EOM are normal. No scleral icterus.  Neck: Normal range of motion. Neck supple. No JVD present. No tracheal deviation present.  Cardiovascular: Normal rate, regular rhythm and normal heart sounds.  Exam reveals no gallop.   No murmur heard. Pulmonary/Chest: Effort normal and breath sounds normal. No respiratory distress. He has no wheezes. He has no rales.  Abdominal: Soft. Bowel sounds are normal. He exhibits no distension. There is no tenderness. There is no rebound.  Musculoskeletal: Normal range of motion. He exhibits no edema or tenderness.  Neurological: He is alert. No cranial nerve deficit.  AAOx 1-2  Skin: No rash noted. No erythema.  Psychiatric:  Demented.   LABORATORY PANEL:  Male CBC  Recent Labs Lab 05/04/17 0734  WBC 8.5  HGB 11.7*  HCT 34.5*  PLT 263   ------------------------------------------------------------------------------------------------------------------ Chemistries   Recent Labs Lab 05/02/17 1415 05/03/17 0747  NA 138 141  K 3.1* 3.5  CL 102 107  CO2 24 26  GLUCOSE 113* 79  BUN 17 14  CREATININE 1.31* 1.25*  CALCIUM 8.2* 8.4*  MG  --  2.1  AST 22  --   ALT 17  --   ALKPHOS 156*  --   BILITOT 1.0  --    RADIOLOGY:  No results found. ASSESSMENT AND PLAN:   * Syncope Etiology unclear. Patient had stress test and echocardiogram at his cardiologist's office which were normal. Per Dr. Milta Deiters NP, no further cardiac workup.  * Acute right sixth and seventh rib fractures.  Pain control. Incentive spirometer.  * Dementia. Per psychiatry Dr. Toni Amend, is probably related to his HIV disease. the patient does not have capacity to make the decision in question. Patient has very poor short-term memory and very poor understanding of his care. Would not be able to take care of himself well at all.   * Hip avascular necrosis.  May need surgery in the future, he needs to be checked by Dr. Sampson Goon prior to consideration for surgery with his immunodeficiency status and meets who he is optimized for elective joint replacement per Dr. Rosita Kea. Per Dr. Sampson Goon, From HIV Viewpoint he is appropriate for surgery as CD4 in normal range and VL is undetectable. He is very compliant with his medications.  Right knee abrasion with some purulenc Follow up R knee wound cx - started doxy for now per Dr. Sampson Goon.  * Diabetes mellitus. Continue home medications and sliding scale insulin.  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  * HIV. Continue home medications.   Diarrhea. Resolved. No BM for C. Difficile test.  PT evaluation. SNF.  All the records are reviewed and case discussed with Care Management/Social Worker. Management plans discussed with the patient's sister. CODE STATUS: Full Code  TOTAL TIME TAKING CARE OF THIS PATIENT: 23 minutes.   More than 50% of the time was spent in counseling/coordination of care: YES  POSSIBLE D/C IN 1-2 DAYS, DEPENDING ON CLINICAL CONDITION.   Shaune Pollack M.D on 05/05/2017 at 3:48 PM  Between 7am to 6pm - Pager - [phone]  After 6pm go to [email or web address] - Therapist, nutritional Hospitalists

## 2017-05-05 NOTE — Progress Notes (Signed)
Cardiac telemetry d/ced per MD order. Tele-sitter and enteric precautions d/ced per MD order. Will continue to monitor.

## 2017-05-05 NOTE — Progress Notes (Signed)
SUBJECTIVE: Feeling better   Vitals:   05/04/17 1600 05/04/17 2001 05/04/17 2033 05/05/17 0343  BP: 120/64 119/69    Pulse: 76 83    Resp:  19    Temp: (!) 97.2 F (36.2 C) 97.7 F (36.5 C)    TempSrc: Oral Oral    SpO2: 98% 98% 97%   Weight:    182 lb 9.6 oz (82.8 kg)  Height:        Intake/Output Summary (Last 24 hours) at 05/05/17 0859 Last data filed at 05/05/17 0253  Gross per 24 hour  Intake              360 ml  Output                0 ml  Net              360 ml    LABS: Basic Metabolic Panel:  Recent Labs  40/98/11 1415 05/03/17 0747  NA 138 141  K 3.1* 3.5  CL 102 107  CO2 24 26  GLUCOSE 113* 79  BUN 17 14  CREATININE 1.31* 1.25*  CALCIUM 8.2* 8.4*  MG  --  2.1   Liver Function Tests:  Recent Labs  05/02/17 1415  AST 22  ALT 17  ALKPHOS 156*  BILITOT 1.0  PROT 6.5  ALBUMIN 2.6*   No results for input(s): LIPASE, AMYLASE in the last 72 hours. CBC:  Recent Labs  05/03/17 0747 05/04/17 0734  WBC 7.9 8.5  HGB 10.9* 11.7*  HCT 31.7* 34.5*  MCV 101.4* 100.8*  PLT 245 263   Cardiac Enzymes: No results for input(s): CKTOTAL, CKMB, CKMBINDEX, TROPONINI in the last 72 hours. BNP: Invalid input(s): POCBNP D-Dimer: No results for input(s): DDIMER in the last 72 hours. Hemoglobin A1C: No results for input(s): HGBA1C in the last 72 hours. Fasting Lipid Panel: No results for input(s): CHOL, HDL, LDLCALC, TRIG, CHOLHDL, LDLDIRECT in the last 72 hours. Thyroid Function Tests: No results for input(s): TSH, T4TOTAL, T3FREE, THYROIDAB in the last 72 hours.  Invalid input(s): FREET3 Anemia Panel: No results for input(s): VITAMINB12, FOLATE, FERRITIN, TIBC, IRON, RETICCTPCT in the last 72 hours.   PHYSICAL EXAM General: Well developed, well nourished, in no acute distress HEENT:  Normocephalic and atramatic Neck:  No JVD.  Lungs: Clear bilaterally to auscultation and percussion. Heart: HRRR . Normal S1 and S2 without gallops or murmurs.   Abdomen: Bowel sounds are positive, abdomen soft and non-tender  Msk:  Back normal, normal gait. Normal strength and tone for age. Extremities: No clubbing, cyanosis or edema.   Neuro: Alert and oriented X 3. Psych:  Good affect, responds appropriately  TELEMETRY: NSR  ASSESSMENT AND PLAN: PVD , mild CAD, normal LVEF. To have intervention of LE.  Principal Problem:   HIV dementia (HCC) Active Problems:   Falls   Avascular necrosis of bones of both hips (HCC)    Jolaine Fryberger A, MD, [facility] 05/05/2017 8:59 AM

## 2017-05-05 NOTE — Progress Notes (Signed)
Wound care performed on R hand, R elbow, L elbow, and bilateral knees. All areas look to be skin tear partial thickness areas. Cleaned areas with normal saline, neosporin applied to all per MD order, and foam dressings applied per order. Pt tolerated dressings well. Will continue to monitor.

## 2017-05-05 NOTE — Progress Notes (Signed)
Patient's sister Junious Dresser did not bring patient's HIV medication to [facility] yesterday as requested. Clinical Social Worker (CSW) called Junious Dresser this morning and emphasized that she needs to bring patient's HIV medications or SNF placement will not be an option. Junious Dresser stated that she would try to bring the medications today. Patient has a tele-sitter and needs to be tele-sitter free for 24 hours prior to D/C to SNF. RN aware of above.   [employer], LCSW [phone]

## 2017-05-05 NOTE — Progress Notes (Signed)
Physical Therapy Treatment Patient Details Name: Brian Hampton MRN: [id number] DOB: Dec 11, 1955 Today's Date: 05/05/2017    History of Present Illness 61 y.o. male with a known history of anemia, bronchial asthma, collagen vascular disease, COPD, coronary artery disease, recurrent DVT, hypertension, HIV infection, PE.  He is here with multiple frequent falls with multiple rib fractures, possible further adressing of L hip ANV.    PT Comments    Pt is able to perform supine and seated exercises with therapist on this date. He continues to require some assist with bed mobility and transfers due to weakness, balance, and cognition. He is again able to perform multiple laps ambulating in room with therapist. Cues required for turning as well as assist to stabilize. Pt with poor balance when backing up to recliner. No signs of respiratory distress during ambulation. He will need SNF placement at discharge in order to facilitate safe return home. Pt will benefit from PT services to address deficits in strength, balance, and mobility in order to return to full function at home.    Follow Up Recommendations  SNF     Equipment Recommendations  Other (comment) (TBD at next venue)    Recommendations for Other Services       Precautions / Restrictions Precautions Precautions: Fall Restrictions Weight Bearing Restrictions: Yes    Mobility  Bed Mobility Overal bed mobility: Modified Independent Bed Mobility: Supine to Sit     Supine to sit: Min assist     General bed mobility comments: HOB elevated and bed rail utilized. Increased time and effort required and some LE assist as well as cues for sequencing required.  Transfers Overall transfer level: Needs assistance Equipment used: Rolling walker (2 wheeled) Transfers: Sit to/from Stand Sit to Stand: Min assist         General transfer comment: Pt requires minA+1 to stabilize with transfers. Improved stability today compared to prior  session. Once upright pt requires CGA in standing but is mostly able to stabilize himself with bilateral UE support on walker  Ambulation/Gait Ambulation/Gait assistance: Min assist Ambulation Distance (Feet): 40 Feet Assistive device: Rolling walker (2 wheeled) Gait Pattern/deviations: Decreased step length - right;Decreased step length - left Gait velocity: Decreased Gait velocity interpretation: <1.8 ft/sec, indicative of risk for recurrent falls General Gait Details: Pt is again able to perform multiple laps in room with therapist. Cues required for turning as well as assist to stabilize. Pt with poor balance when backing up to recliner. No signs of respiratory distress during ambulation.    Stairs            Wheelchair Mobility    Modified Rankin (Stroke Patients Only)       Balance Overall balance assessment: Needs assistance Sitting-balance support: No upper extremity supported Sitting balance-Leahy Scale: Fair     Standing balance support: Bilateral upper extremity supported Standing balance-Leahy Scale: Poor Standing balance comment: Intermittent support required on walker and from therapist to stabilize                            Cognition Arousal/Alertness: Awake/alert Behavior During Therapy: WFL for tasks assessed/performed Overall Cognitive Status: History of cognitive impairments - at baseline                                 General Comments: AOx3 at time of PT treatment. Conversation is somewhat confused at  time and speech can be garbled.      Exercises General Exercises - Lower Extremity Ankle Circles/Pumps: AROM;Both;10 reps;Supine Quad Sets: Strengthening;Both;Supine;5 reps Gluteal Sets: Strengthening;Both;Supine;5 reps Short Arc Quad: Strengthening;Both;10 reps;Supine Long Arc Quad: Strengthening;Both;10 reps;Seated Heel Slides: Strengthening;Both;10 reps;Supine Hip ABduction/ADduction: Strengthening;Both;10  reps;Supine;Other (comment) (Performed x 10 in sitting as well) Straight Leg Raises: Strengthening;Both;10 reps;Seated Hip Flexion/Marching: Strengthening;Both;10 reps;Seated    General Comments        Pertinent Vitals/Pain Pain Assessment: 0-10 Pain Score: 9  Pain Location: L hip Pain Descriptors / Indicators: Aching Pain Intervention(s): Monitored during session;Other (comment) (RN notified)    Home Living                      Prior Function            PT Goals (current goals can now be found in the care plan section) Acute Rehab PT Goals Patient Stated Goal: have hip surgery ASAP PT Goal Formulation: With patient Time For Goal Achievement: 05/17/17 Potential to Achieve Goals: Fair Progress towards PT goals: Progressing toward goals    Frequency    Min 2X/week      PT Plan Current plan remains appropriate    Co-evaluation              AM-PAC PT "6 Clicks" Daily Activity  Outcome Measure  Difficulty turning over in bed (including adjusting bedclothes, sheets and blankets)?: A Little Difficulty moving from lying on back to sitting on the side of the bed? : Unable Difficulty sitting down on and standing up from a chair with arms (e.g., wheelchair, bedside commode, etc,.)?: Unable Help needed moving to and from a bed to chair (including a wheelchair)?: A Little Help needed walking in hospital room?: A Little Help needed climbing 3-5 steps with a railing? : A Lot 6 Click Score: 13    End of Session Equipment Utilized During Treatment: Gait belt Activity Tolerance: Patient tolerated treatment well Patient left: in chair;with call bell/phone within reach;with chair alarm set Nurse Communication: Mobility status;Other (comment) (RN present to observe) PT Visit Diagnosis: Muscle weakness (generalized) (M62.81);Difficulty in walking, not elsewhere classified (R26.2);Pain Pain - Right/Left: Left Pain - part of body: Hip     Time: 0920-0951 PT Time  Calculation (min) (ACUTE ONLY): 31 min  Charges:  $Gait Training: 8-22 mins $Therapeutic Exercise: 8-22 mins                    G Codes:       Sharalyn Ink Amiaya Mcneeley PT, DPT     Verdia Bolt 05/05/2017, 10:36 AM

## 2017-05-05 NOTE — Progress Notes (Addendum)
Clinical Child psychotherapist (CSW) contacted [employer] home health social worker. Per Aldean Jewett she will attempt to bring patient's medications from home to [facility] with patient's sister Connie's help. CSW has contacted Junious Dresser multiple times and asked her to bring the medication however she has not complied. Per Millie patient's medication is out of the bottles and pills are all over the floor.   [employer], LCSW [phone]

## 2017-05-05 NOTE — Plan of Care (Signed)
Problem: Safety: Goal: Ability to remain free from injury will improve Outcome: Not Progressing Pt extremely confused and impulsive. Bed alarm on, siderails up and placed on telesitter for safety.

## 2017-05-05 NOTE — Progress Notes (Signed)
Old dressings removed from bilateral knee wounds, R elbow, and L elbow. Areas cleaned with normal saline, neosporin applied and foam dressing applied per MD order. Pt tolerated dressings well. Will continue to monitor.

## 2017-05-06 ENCOUNTER — Ambulatory Visit: Payer: Self-pay | Admitting: *Deleted

## 2017-05-06 ENCOUNTER — Other Ambulatory Visit: Payer: Self-pay | Admitting: *Deleted

## 2017-05-06 ENCOUNTER — Encounter: Payer: Self-pay | Admitting: *Deleted

## 2017-05-06 DIAGNOSIS — F05 Delirium due to known physiological condition: Secondary | ICD-10-CM | POA: Diagnosis not present

## 2017-05-06 DIAGNOSIS — L988 Other specified disorders of the skin and subcutaneous tissue: Secondary | ICD-10-CM | POA: Diagnosis not present

## 2017-05-06 DIAGNOSIS — J45909 Unspecified asthma, uncomplicated: Secondary | ICD-10-CM | POA: Diagnosis not present

## 2017-05-06 DIAGNOSIS — M87851 Other osteonecrosis, right femur: Secondary | ICD-10-CM | POA: Diagnosis not present

## 2017-05-06 DIAGNOSIS — F329 Major depressive disorder, single episode, unspecified: Secondary | ICD-10-CM | POA: Diagnosis not present

## 2017-05-06 DIAGNOSIS — E1122 Type 2 diabetes mellitus with diabetic chronic kidney disease: Secondary | ICD-10-CM | POA: Diagnosis present

## 2017-05-06 DIAGNOSIS — M25551 Pain in right hip: Secondary | ICD-10-CM | POA: Diagnosis not present

## 2017-05-06 DIAGNOSIS — Z880 Allergy status to penicillin: Secondary | ICD-10-CM | POA: Diagnosis not present

## 2017-05-06 DIAGNOSIS — F321 Major depressive disorder, single episode, moderate: Secondary | ICD-10-CM | POA: Diagnosis not present

## 2017-05-06 DIAGNOSIS — S81002D Unspecified open wound, left knee, subsequent encounter: Secondary | ICD-10-CM | POA: Diagnosis not present

## 2017-05-06 DIAGNOSIS — Z7901 Long term (current) use of anticoagulants: Secondary | ICD-10-CM | POA: Diagnosis not present

## 2017-05-06 DIAGNOSIS — Z66 Do not resuscitate: Secondary | ICD-10-CM | POA: Diagnosis present

## 2017-05-06 DIAGNOSIS — Z01812 Encounter for preprocedural laboratory examination: Secondary | ICD-10-CM | POA: Diagnosis not present

## 2017-05-06 DIAGNOSIS — Z62810 Personal history of physical and sexual abuse in childhood: Secondary | ICD-10-CM | POA: Diagnosis not present

## 2017-05-06 DIAGNOSIS — M8700 Idiopathic aseptic necrosis of unspecified bone: Secondary | ICD-10-CM | POA: Diagnosis not present

## 2017-05-06 DIAGNOSIS — R300 Dysuria: Secondary | ICD-10-CM | POA: Diagnosis not present

## 2017-05-06 DIAGNOSIS — E875 Hyperkalemia: Secondary | ICD-10-CM | POA: Diagnosis present

## 2017-05-06 DIAGNOSIS — J449 Chronic obstructive pulmonary disease, unspecified: Secondary | ICD-10-CM | POA: Diagnosis not present

## 2017-05-06 DIAGNOSIS — D649 Anemia, unspecified: Secondary | ICD-10-CM | POA: Diagnosis not present

## 2017-05-06 DIAGNOSIS — I129 Hypertensive chronic kidney disease with stage 1 through stage 4 chronic kidney disease, or unspecified chronic kidney disease: Secondary | ICD-10-CM | POA: Diagnosis present

## 2017-05-06 DIAGNOSIS — D539 Nutritional anemia, unspecified: Secondary | ICD-10-CM | POA: Diagnosis not present

## 2017-05-06 DIAGNOSIS — S2241XA Multiple fractures of ribs, right side, initial encounter for closed fracture: Secondary | ICD-10-CM | POA: Diagnosis not present

## 2017-05-06 DIAGNOSIS — B20 Human immunodeficiency virus [HIV] disease: Secondary | ICD-10-CM | POA: Diagnosis not present

## 2017-05-06 DIAGNOSIS — E039 Hypothyroidism, unspecified: Secondary | ICD-10-CM | POA: Diagnosis not present

## 2017-05-06 DIAGNOSIS — R2689 Other abnormalities of gait and mobility: Secondary | ICD-10-CM | POA: Diagnosis not present

## 2017-05-06 DIAGNOSIS — Z0001 Encounter for general adult medical examination with abnormal findings: Secondary | ICD-10-CM | POA: Diagnosis not present

## 2017-05-06 DIAGNOSIS — R609 Edema, unspecified: Secondary | ICD-10-CM | POA: Diagnosis not present

## 2017-05-06 DIAGNOSIS — I2510 Atherosclerotic heart disease of native coronary artery without angina pectoris: Secondary | ICD-10-CM | POA: Diagnosis not present

## 2017-05-06 DIAGNOSIS — R079 Chest pain, unspecified: Secondary | ICD-10-CM | POA: Diagnosis not present

## 2017-05-06 DIAGNOSIS — D631 Anemia in chronic kidney disease: Secondary | ICD-10-CM | POA: Diagnosis present

## 2017-05-06 DIAGNOSIS — J45998 Other asthma: Secondary | ICD-10-CM | POA: Diagnosis not present

## 2017-05-06 DIAGNOSIS — I2699 Other pulmonary embolism without acute cor pulmonale: Secondary | ICD-10-CM | POA: Diagnosis not present

## 2017-05-06 DIAGNOSIS — D638 Anemia in other chronic diseases classified elsewhere: Secondary | ICD-10-CM | POA: Diagnosis not present

## 2017-05-06 DIAGNOSIS — I82401 Acute embolism and thrombosis of unspecified deep veins of right lower extremity: Secondary | ICD-10-CM | POA: Diagnosis not present

## 2017-05-06 DIAGNOSIS — K7460 Unspecified cirrhosis of liver: Secondary | ICD-10-CM | POA: Diagnosis present

## 2017-05-06 DIAGNOSIS — Z5181 Encounter for therapeutic drug level monitoring: Secondary | ICD-10-CM | POA: Diagnosis not present

## 2017-05-06 DIAGNOSIS — Z882 Allergy status to sulfonamides status: Secondary | ICD-10-CM | POA: Diagnosis not present

## 2017-05-06 DIAGNOSIS — N320 Bladder-neck obstruction: Secondary | ICD-10-CM | POA: Diagnosis present

## 2017-05-06 DIAGNOSIS — F0280 Dementia in other diseases classified elsewhere without behavioral disturbance: Secondary | ICD-10-CM | POA: Diagnosis not present

## 2017-05-06 DIAGNOSIS — N183 Chronic kidney disease, stage 3 (moderate): Secondary | ICD-10-CM | POA: Diagnosis present

## 2017-05-06 DIAGNOSIS — D6851 Activated protein C resistance: Secondary | ICD-10-CM | POA: Diagnosis present

## 2017-05-06 DIAGNOSIS — Z87891 Personal history of nicotine dependence: Secondary | ICD-10-CM | POA: Diagnosis not present

## 2017-05-06 DIAGNOSIS — R531 Weakness: Secondary | ICD-10-CM | POA: Diagnosis not present

## 2017-05-06 DIAGNOSIS — Z7401 Bed confinement status: Secondary | ICD-10-CM | POA: Diagnosis not present

## 2017-05-06 DIAGNOSIS — R944 Abnormal results of kidney function studies: Secondary | ICD-10-CM | POA: Diagnosis not present

## 2017-05-06 DIAGNOSIS — J439 Emphysema, unspecified: Secondary | ICD-10-CM | POA: Diagnosis not present

## 2017-05-06 DIAGNOSIS — S2239XA Fracture of one rib, unspecified side, initial encounter for closed fracture: Secondary | ICD-10-CM | POA: Diagnosis not present

## 2017-05-06 DIAGNOSIS — I82411 Acute embolism and thrombosis of right femoral vein: Secondary | ICD-10-CM | POA: Diagnosis not present

## 2017-05-06 DIAGNOSIS — E119 Type 2 diabetes mellitus without complications: Secondary | ICD-10-CM | POA: Diagnosis not present

## 2017-05-06 DIAGNOSIS — N189 Chronic kidney disease, unspecified: Secondary | ICD-10-CM | POA: Diagnosis not present

## 2017-05-06 DIAGNOSIS — R0602 Shortness of breath: Secondary | ICD-10-CM | POA: Diagnosis not present

## 2017-05-06 DIAGNOSIS — N182 Chronic kidney disease, stage 2 (mild): Secondary | ICD-10-CM | POA: Diagnosis not present

## 2017-05-06 DIAGNOSIS — E785 Hyperlipidemia, unspecified: Secondary | ICD-10-CM | POA: Diagnosis not present

## 2017-05-06 DIAGNOSIS — Z886 Allergy status to analgesic agent status: Secondary | ICD-10-CM | POA: Diagnosis not present

## 2017-05-06 DIAGNOSIS — K219 Gastro-esophageal reflux disease without esophagitis: Secondary | ICD-10-CM | POA: Diagnosis present

## 2017-05-06 DIAGNOSIS — R5383 Other fatigue: Secondary | ICD-10-CM | POA: Diagnosis not present

## 2017-05-06 DIAGNOSIS — G4700 Insomnia, unspecified: Secondary | ICD-10-CM | POA: Diagnosis not present

## 2017-05-06 DIAGNOSIS — R296 Repeated falls: Secondary | ICD-10-CM | POA: Diagnosis not present

## 2017-05-06 DIAGNOSIS — S81002A Unspecified open wound, left knee, initial encounter: Secondary | ICD-10-CM | POA: Diagnosis not present

## 2017-05-06 DIAGNOSIS — N179 Acute kidney failure, unspecified: Secondary | ICD-10-CM | POA: Diagnosis not present

## 2017-05-06 DIAGNOSIS — Z881 Allergy status to other antibiotic agents status: Secondary | ICD-10-CM | POA: Diagnosis not present

## 2017-05-06 DIAGNOSIS — M6281 Muscle weakness (generalized): Secondary | ICD-10-CM | POA: Diagnosis not present

## 2017-05-06 DIAGNOSIS — I10 Essential (primary) hypertension: Secondary | ICD-10-CM | POA: Diagnosis not present

## 2017-05-06 DIAGNOSIS — I998 Other disorder of circulatory system: Secondary | ICD-10-CM | POA: Diagnosis not present

## 2017-05-06 DIAGNOSIS — Z91030 Bee allergy status: Secondary | ICD-10-CM | POA: Diagnosis not present

## 2017-05-06 DIAGNOSIS — R55 Syncope and collapse: Secondary | ICD-10-CM | POA: Diagnosis not present

## 2017-05-06 DIAGNOSIS — M87852 Other osteonecrosis, left femur: Secondary | ICD-10-CM | POA: Diagnosis not present

## 2017-05-06 DIAGNOSIS — M879 Osteonecrosis, unspecified: Secondary | ICD-10-CM | POA: Diagnosis not present

## 2017-05-06 DIAGNOSIS — Z86711 Personal history of pulmonary embolism: Secondary | ICD-10-CM | POA: Diagnosis not present

## 2017-05-06 DIAGNOSIS — M25552 Pain in left hip: Secondary | ICD-10-CM | POA: Diagnosis not present

## 2017-05-06 DIAGNOSIS — Z86718 Personal history of other venous thrombosis and embolism: Secondary | ICD-10-CM | POA: Diagnosis not present

## 2017-05-06 DIAGNOSIS — R600 Localized edema: Secondary | ICD-10-CM | POA: Diagnosis not present

## 2017-05-06 DIAGNOSIS — I252 Old myocardial infarction: Secondary | ICD-10-CM | POA: Diagnosis not present

## 2017-05-06 DIAGNOSIS — Z23 Encounter for immunization: Secondary | ICD-10-CM | POA: Diagnosis not present

## 2017-05-06 LAB — GLUCOSE, CAPILLARY
GLUCOSE-CAPILLARY: 80 mg/dL (ref 65–99)
GLUCOSE-CAPILLARY: 94 mg/dL (ref 65–99)
Glucose-Capillary: 97 mg/dL (ref 65–99)

## 2017-05-06 MED ORDER — PREGABALIN 75 MG PO CAPS: 75.0000 mg | ORAL_CAPSULE | Freq: Two times a day (BID) | ORAL | 0 refills | Status: DC

## 2017-05-06 MED ORDER — HYDROCODONE-ACETAMINOPHEN 5-325 MG PO TABS: 1.0000 | ORAL_TABLET | Freq: Four times a day (QID) | ORAL | 0 refills | Status: AC | PRN

## 2017-05-06 MED ORDER — POLYETHYLENE GLYCOL 3350 17 G PO PACK: 17.0000 g | PACK | Freq: Every day | ORAL | 0 refills | Status: AC | PRN

## 2017-05-06 MED ORDER — CYCLOBENZAPRINE HCL 5 MG PO TABS: 5.0000 mg | ORAL_TABLET | Freq: Three times a day (TID) | ORAL | 0 refills | Status: DC | PRN

## 2017-05-06 MED ORDER — METOPROLOL SUCCINATE ER 50 MG PO TB24: 50.0000 mg | ORAL_TABLET | Freq: Every day | ORAL | Status: DC

## 2017-05-06 MED ORDER — TRAMADOL HCL 50 MG PO TABS: 50.0000 mg | ORAL_TABLET | Freq: Two times a day (BID) | ORAL | 0 refills | Status: DC

## 2017-05-06 MED ORDER — ZOLPIDEM TARTRATE 5 MG PO TABS: 5.0000 mg | ORAL_TABLET | Freq: Every day | ORAL | 0 refills | Status: AC

## 2017-05-06 MED ORDER — DOXYCYCLINE HYCLATE 100 MG PO TABS: 100.0000 mg | ORAL_TABLET | Freq: Two times a day (BID) | ORAL | 0 refills | Status: DC

## 2017-05-06 MED ORDER — AMIODARONE HCL 400 MG PO TABS: 400.0000 mg | ORAL_TABLET | Freq: Every day | ORAL | Status: DC

## 2017-05-06 NOTE — Care Management (Signed)
Crystal with [facility] is aware of patient going to SNF. I have notified Clydie Braun with Palliative also.

## 2017-05-06 NOTE — Discharge Summary (Signed)
[facility] [place] [facility]   PATIENT NAME: Brian Hampton    MR#:  [id number]  DATE OF BIRTH:  [birth year]  DATE OF ADMISSION:  05/02/2017   ADMITTING PHYSICIAN: Milagros Loll, MD  DATE OF DISCHARGE: 05/06/2017  PRIMARY CARE PHYSICIAN: Lyndon Code, MD   ADMISSION DIAGNOSIS:  Fall [W19.XXXA] Frequent falls [R29.6] Avascular necrosis of bone of hip, left (HCC) [M87.052] Acute hip pain, left [M25.552] Closed fracture of multiple ribs of right side, initial encounter [S22.41XA] DISCHARGE DIAGNOSIS:  Principal Problem:   HIV dementia (HCC) Active Problems:   Falls   Avascular necrosis of bones of both hips (HCC)  SECONDARY DIAGNOSIS:   Past Medical History:  Diagnosis Date  . Anemia   . Asthma   . Cataracts, bilateral    worse in Rt eye  . Collagen vascular disease (HCC)   . COPD (chronic obstructive pulmonary disease) (HCC)   . Coronary artery disease   . Depression   . DVT (deep venous thrombosis) (HCC)   . Dysrhythmia   . Emphysema/COPD (HCC)   . Environmental and seasonal allergies   . H/O blood clots   . Heart murmur   . HIV (human immunodeficiency virus infection) (HCC)   . Hypertension   . Leaky heart valve    x 3  . Lung mass   . Myocardial infarction (HCC)    in 2000  . Pneumonia    year ago  . Pulmonary emboli (HCC)   . Type 2 diabetes mellitus (HCC)    HOSPITAL COURSE:   * Syncope Etiology unclear. Patient had stress test and echocardiogram at his cardiologist's office which were normal. Per Dr. Milta Deiters NP, no further cardiac workup.  * Acute right sixth and seventh rib fractures. Pain control. Incentive spirometer.  * Dementia. Per psychiatry Dr. Toni Amend, is probably related to his HIV disease. the patient does not have capacity to make the decision in question. Patient has very poor short-term memory and very poor understanding of his care. Would not be able to take care of himself well at all.   * Hip avascular  necrosis.  May need surgery in the future, he needs to be checked by Dr. Sampson Goon prior to consideration for surgery with his immunodeficiency status and meets who he is optimized for elective joint replacement per Dr. Rosita Kea. Per Dr. Sampson Goon, From HIV Viewpoint he is appropriate for surgery as CD4 in normal range and VL is undetectable. He is very compliant with his medications.  Right knee abrasion with some purulenc Follow upR knee wound cx - started doxy for now per Dr. Sampson Goon.  * Diabetes mellitus. Continue home medications and sliding scale insulin.  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  * HIV. Continue home medications.   Diarrhea. Resolved. No BM for C. Difficile test.  PT evaluation. SNF.  DISCHARGE CONDITIONS:  Stable, discharge to SNF today. CONSULTS OBTAINED:  Treatment Team:  Audery Amel, MD Laurier Nancy, MD Kennedy Bucker, MD Mick Sell, MD DRUG ALLERGIES:   Allergies  Allergen Reactions  . Aspirin Anaphylaxis  . Bee Venom Anaphylaxis  . Penicillins Anaphylaxis and Other (See Comments)    Has patient had a PCN reaction causing immediate rash, facial/tongue/throat swelling, SOB or lightheadedness with hypotension: Yes Has patient had a PCN reaction causing severe rash involving mucus membranes or skin necrosis: No Has patient had a PCN reaction that required hospitalization No Has patient had a PCN reaction occurring within the  last 10 years: Yes If all of the above answers are "NO", then may proceed with Cephalosporin use.  . Prednisone Anaphylaxis  . Sulfa Antibiotics Anaphylaxis  . Sulfasalazine Anaphylaxis  . Theophyllines Anaphylaxis  . Theophylline Swelling   DISCHARGE MEDICATIONS:   Allergies as of 05/06/2017      Reactions   Aspirin Anaphylaxis   Bee Venom Anaphylaxis   Penicillins Anaphylaxis, Other (See Comments)   Has patient had a PCN reaction causing immediate rash, facial/tongue/throat swelling, SOB or  lightheadedness with hypotension: Yes Has patient had a PCN reaction causing severe rash involving mucus membranes or skin necrosis: No Has patient had a PCN reaction that required hospitalization No Has patient had a PCN reaction occurring within the last 10 years: Yes If all of the above answers are "NO", then may proceed with Cephalosporin use.   Prednisone Anaphylaxis   Sulfa Antibiotics Anaphylaxis   Sulfasalazine Anaphylaxis   Theophyllines Anaphylaxis   Theophylline Swelling      Medication List    STOP taking these medications   carvedilol 12.5 MG tablet Commonly known as:  COREG   losartan 25 MG tablet Commonly known as:  COZAAR     TAKE these medications   abacavir-dolutegravir-lamiVUDine 600-50-300 MG tablet Commonly known as:  TRIUMEQ Take 1 tablet by mouth daily after breakfast. Reported on 01/30/2016   acetaminophen 325 MG tablet Commonly known as:  TYLENOL Take 1 tablet (325 mg total) by mouth every 6 (six) hours as needed for mild pain (or Fever >/= 101).   albuterol 108 (90 Base) MCG/ACT inhaler Commonly known as:  PROVENTIL HFA;VENTOLIN HFA Inhale 2 puffs into the lungs every 6 (six) hours as needed for wheezing or shortness of breath.   albuterol (2.5 MG/3ML) 0.083% nebulizer solution Commonly known as:  PROVENTIL Take 2.5 mg by nebulization 3 (three) times daily.   amiodarone 400 MG tablet Commonly known as:  PACERONE Take 1 tablet (400 mg total) by mouth daily. What changed:  medication strength  how much to take   atorvastatin 10 MG tablet Commonly known as:  LIPITOR Take 10 mg by mouth daily after breakfast.   CALCIUM 600+D 600-400 MG-UNIT tablet Generic drug:  Calcium Carbonate-Vitamin D Take 1 tablet by mouth daily after breakfast.   cetirizine 10 MG tablet Commonly known as:  ZYRTEC Take 10 mg by mouth daily as needed for allergies.   cyclobenzaprine 5 MG tablet Commonly known as:  FLEXERIL Take 1 tablet (5 mg total) by mouth 3  (three) times daily as needed for muscle spasms.   docusate sodium 50 MG capsule Commonly known as:  COLACE Take 50 mg by mouth daily as needed for mild constipation.   doxycycline 100 MG tablet Commonly known as:  VIBRA-TABS Take 1 tablet (100 mg total) by mouth every 12 (twelve) hours.   DULERA 200-5 MCG/ACT Aero Generic drug:  mometasone-formoterol Inhale 2 puffs into the lungs 2 (two) times daily.   EPIPEN 2-PAK 0.3 mg/0.3 mL Soaj injection Generic drug:  EPINEPHrine Inject 0.3 mg into the muscle once as needed. For anaphylaxis   escitalopram 20 MG tablet Commonly known as:  LEXAPRO Take 20 mg by mouth daily after breakfast.   fondaparinux 7.5 MG/0.6ML Soln injection Commonly known as:  ARIXTRA Inject 0.6 mLs (7.5 mg total) into the skin daily.   furosemide 20 MG tablet Commonly known as:  LASIX Take 20 mg by mouth daily.   HYDROcodone-acetaminophen 5-325 MG tablet Commonly known as:  NORCO Take 1 tablet by  mouth every 6 (six) hours as needed for moderate pain.   levothyroxine 50 MCG tablet Commonly known as:  SYNTHROID, LEVOTHROID Take 50 mcg by mouth daily before breakfast.   linaclotide 72 MCG capsule Commonly known as:  LINZESS Take 72 mcg by mouth daily after breakfast.   Magnesium Oxide 500 MG Tabs Take 500 mg by mouth daily after breakfast.   meloxicam 7.5 MG tablet Commonly known as:  MOBIC Take 7.5 mg by mouth daily as needed for pain.   metoprolol succinate 50 MG 24 hr tablet Commonly known as:  TOPROL-XL Take 1 tablet (50 mg total) by mouth daily. Take with or immediately following a meal.   mirtazapine 15 MG tablet Commonly known as:  REMERON Take 15 mg by mouth at bedtime.   multivitamin with minerals Tabs tablet Take 1 tablet by mouth daily.   nitroGLYCERIN 0.4 MG SL tablet Commonly known as:  NITROSTAT Place 0.4 mg under the tongue every 5 (five) minutes as needed for chest pain.   omeprazole 40 MG capsule Commonly known as:   PRILOSEC Take 40 mg by mouth daily after breakfast.   polyethylene glycol packet Commonly known as:  MIRALAX / GLYCOLAX Take 17 g by mouth daily as needed for mild constipation.   Potassium Chloride CR 8 MEQ Cpcr capsule CR Commonly known as:  MICRO-K Take 8 mEq by mouth daily after breakfast.   pregabalin 75 MG capsule Commonly known as:  LYRICA Take 1 capsule (75 mg total) by mouth 2 (two) times daily.   sennosides-docusate sodium 8.6-50 MG tablet Commonly known as:  SENOKOT-S Take 1 tablet by mouth at bedtime as needed for constipation.   tiotropium 18 MCG inhalation capsule Commonly known as:  SPIRIVA Place 18 mcg into inhaler and inhale daily.   traMADol 50 MG tablet Commonly known as:  ULTRAM Take 1 tablet (50 mg total) by mouth 2 (two) times daily.   zolpidem 5 MG tablet Commonly known as:  AMBIEN Take 1 tablet (5 mg total) by mouth at bedtime.            Discharge Care Instructions        Start     Ordered   05/07/17 0000  amiodarone (PACERONE) 400 MG tablet  Daily     05/06/17 1029   05/06/17 0000  Increase activity slowly     05/06/17 1018   05/06/17 0000  Diet - low sodium heart healthy     05/06/17 1018   05/06/17 0000  HYDROcodone-acetaminophen (NORCO) 5-325 MG tablet  Every 6 hours PRN     05/06/17 1029   05/06/17 0000  traMADol (ULTRAM) 50 MG tablet  2 times daily     05/06/17 1029   05/06/17 0000  zolpidem (AMBIEN) 5 MG tablet  Daily at bedtime     05/06/17 1029   05/06/17 0000  metoprolol succinate (TOPROL-XL) 50 MG 24 hr tablet  Daily     05/06/17 1029   05/06/17 0000  polyethylene glycol (MIRALAX / GLYCOLAX) packet  Daily PRN     05/06/17 1029   05/06/17 0000  doxycycline (VIBRA-TABS) 100 MG tablet  Every 12 hours     05/06/17 1029   05/06/17 0000  cyclobenzaprine (FLEXERIL) 5 MG tablet  3 times daily PRN     05/06/17 1029   05/06/17 0000  pregabalin (LYRICA) 75 MG capsule  2 times daily     05/06/17 1029       DISCHARGE  INSTRUCTIONS:  See AVS.  If you experience worsening of your admission symptoms, develop shortness of breath, life threatening emergency, suicidal or homicidal thoughts you must seek medical attention immediately by calling 911 or calling your MD immediately  if symptoms less severe.  You Must read complete instructions/literature along with all the possible adverse reactions/side effects for all the Medicines you take and that have been prescribed to you. Take any new Medicines after you have completely understood and accpet all the possible adverse reactions/side effects.   Please note  You were cared for by a hospitalist during your hospital stay. If you have any questions about your discharge medications or the care you received while you were in the hospital after you are discharged, you can call the unit and asked to speak with the hospitalist on call if the hospitalist that took care of you is not available. Once you are discharged, your primary care physician will handle any further medical issues. Please note that NO REFILLS for any discharge medications will be authorized once you are discharged, as it is imperative that you return to your primary care physician (or establish a relationship with a primary care physician if you do not have one) for your aftercare needs so that they can reassess your need for medications and monitor your lab values.    On the day of Discharge:  VITAL SIGNS:  Blood pressure 116/70, pulse 85, temperature 98.6 F (37 C), temperature source Oral, resp. rate (!) 24, height  (1.651 m), weight 182 lb 3 oz (82.6 kg), SpO2 97 %. PHYSICAL EXAMINATION:  GENERAL:  61 y.o.-year-old patient lying in the bed with no acute distress.  EYES: Pupils equal, round, reactive to light and accommodation. No scleral icterus. Extraocular muscles intact.  HEENT: Head atraumatic, normocephalic. Oropharynx and nasopharynx clear.  NECK:  Supple, no jugular venous distention. No  thyroid enlargement, no tenderness.  LUNGS: Normal breath sounds bilaterally, mild wheezing, no rales,rhonchi or crepitation. No use of accessory muscles of respiration.  CARDIOVASCULAR: S1, S2 normal. No murmurs, rubs, or gallops.  ABDOMEN: Soft, non-tender, non-distended. Bowel sounds present. No organomegaly or mass.  EXTREMITIES: No pedal edema, cyanosis, or clubbing.  NEUROLOGIC: Cranial nerves II through XII are intact. Muscle strength 3-4/5 in all extremities. Sensation intact. Gait not checked.  PSYCHIATRIC: The patient is alert and oriented x 1-2.  SKIN: No obvious rash, lesion, or ulcer.  DATA REVIEW:   CBC  Recent Labs Lab 05/04/17 0734  WBC 8.5  HGB 11.7*  HCT 34.5*  PLT 263    Chemistries   Recent Labs Lab 05/02/17 1415 05/03/17 0747  NA 138 141  K 3.1* 3.5  CL 102 107  CO2 24 26  GLUCOSE 113* 79  BUN 17 14  CREATININE 1.31* 1.25*  CALCIUM 8.2* 8.4*  MG  --  2.1  AST 22  --   ALT 17  --   ALKPHOS 156*  --   BILITOT 1.0  --      Microbiology Results  Results for orders placed or performed during the hospital encounter of 05/02/17  MRSA PCR Screening     Status: None   Collection Time: 05/03/17  4:54 AM  Result Value Ref Range Status   MRSA by PCR NEGATIVE NEGATIVE Final    Comment:        The GeneXpert MRSA Assay (FDA approved for NASAL specimens only), is one component of a comprehensive MRSA colonization surveillance program. It is not intended to diagnose MRSA infection nor to guide  or monitor treatment for MRSA infections.   Aerobic Culture (superficial specimen)     Status: None (Preliminary result)   Collection Time: 05/04/17  3:36 PM  Result Value Ref Range Status   Specimen Description KNEE  Final   Special Requests NONE  Final   Gram Stain   Final    FEW WBC PRESENT, PREDOMINANTLY PMN FEW GRAM POSITIVE COCCI    Culture   Final    FEW STAPHYLOCOCCUS AUREUS CULTURE REINCUBATED FOR BETTER GROWTH Performed at [facility], [address] [place], [place] [address]    Report Status PENDING  Incomplete    RADIOLOGY:  No results found.   Management plans discussed with the patient, her sister and they are in agreement.  CODE STATUS: Full Code   TOTAL TIME TAKING CARE OF THIS PATIENT: 46 minutes.    Shaune Pollack M.D on 05/06/2017 at 2:16 PM  Between 7am to 6pm - Pager - [phone]  After 6pm go to [email or web address] - password [email or web address] [facility]  [facility] [place] [facility]  Office  [phone]  CC: Primary care physician; Lyndon Code, MD   Note: This dictation was prepared with Dragon dictation along with smaller phrase technology. Any transcriptional errors that result from this process are unintentional.

## 2017-05-06 NOTE — Progress Notes (Signed)
Called sister Junious Dresser to let her know EMS here to transport patient and to pickup medication x2.

## 2017-05-06 NOTE — Clinical Social Work Placement (Signed)
   CLINICAL SOCIAL WORK PLACEMENT  NOTE  Date:  05/06/2017  Patient Details  Name: Brian Hampton MRN: [id number] Date of Birth: [birth year]  Clinical Social Work is seeking post-discharge placement for this patient at the Skilled  Nursing Facility level of care (*CSW will initial, date and re-position this form in  chart as items are completed):  Yes   Patient/family provided with [place] Clinical Social Work Department's list of facilities offering this level of care within the geographic area requested by the patient (or if unable, by the patient's family).  Yes   Patient/family informed of their freedom to choose among providers that offer the needed level of care, that participate in Medicare, Medicaid or managed care program needed by the patient, have an available bed and are willing to accept the patient.  Yes   Patient/family informed of [facility]'s ownership interest in [facility] and [facility], as well as of the fact that they are under no obligation to receive care at these facilities.  PASRR submitted to EDS on       PASRR number received on       Existing PASRR number confirmed on 05/03/17     FL2 transmitted to all facilities in geographic area requested by pt/family on 05/04/17     FL2 transmitted to all facilities within larger geographic area on       Patient informed that his/her managed care company has contracts with or will negotiate with certain facilities, including the following:        Yes   Patient/family informed of bed offers received.  Patient chooses bed at  [facility]. )     Physician recommends and patient chooses bed at      Patient to be transferred to  [facility]) on 05/06/17.  Patient to be transferred to facility by  [facility] )     Patient family notified on 05/06/17 of transfer.  Name of family member notified:   (Patient's sister Junious Dresser is at bedside and aware of D/C today. )      PHYSICIAN       Additional Comment:    _______________________________________________ Relena Ivancic, Darleen Crocker, LCSW 05/06/2017, 3:15 PM

## 2017-05-06 NOTE — Patient Outreach (Addendum)
This RN CM was informed by Ma Rings RN [facility] hospital liaison that pt was discharging to [facility] in [place], view in Epic pt to discharge today, recent hospitalization September 24-28, 2018 for fall, Avascular necrosis of bone of hip/left, acute hip pain, closed  Fracture of multiple ribs of right side, HIV dementia.    Also collaborated with Chrystal [facility] LCSW - was told would not have to follow pt at SNF  Since it was in [place].    Plan:  RN CM to discharge from [employer] CM services.             Plan to inform Dr. Welton Flakes of case closure.            Plan to inform [facility] CMA to close case.    Shayne Alken.   Lear Carstens RN CCM [facility] Care Management  [phone]

## 2017-05-06 NOTE — Progress Notes (Signed)
SUBJECTIVE: Pt continues to report being "in pain all over."   Vitals:   05/05/17 2037 05/06/17 0136 05/06/17 0652 05/06/17 0742  BP: 114/74  129/87 116/70  Pulse: 80  82 85  Resp: 18  (!) 24   Temp: 98.3 F (36.8 C)   98.6 F (37 C)  TempSrc: Oral   Oral  SpO2: 100%  96% 97%  Weight:  182 lb 3 oz (82.6 kg)    Height:        Intake/Output Summary (Last 24 hours) at 05/06/17 0840 Last data filed at 05/05/17 1855  Gross per 24 hour  Intake              120 ml  Output                0 ml  Net              120 ml    LABS: Basic Metabolic Panel: No results for input(s): NA, K, CL, CO2, GLUCOSE, BUN, CREATININE, CALCIUM, MG, PHOS in the last 72 hours. Liver Function Tests: No results for input(s): AST, ALT, ALKPHOS, BILITOT, PROT, ALBUMIN in the last 72 hours. No results for input(s): LIPASE, AMYLASE in the last 72 hours. CBC:  Recent Labs  05/04/17 0734  WBC 8.5  HGB 11.7*  HCT 34.5*  MCV 100.8*  PLT 263   Cardiac Enzymes: No results for input(s): CKTOTAL, CKMB, CKMBINDEX, TROPONINI in the last 72 hours. BNP: Invalid input(s): POCBNP D-Dimer: No results for input(s): DDIMER in the last 72 hours. Hemoglobin A1C: No results for input(s): HGBA1C in the last 72 hours. Fasting Lipid Panel: No results for input(s): CHOL, HDL, LDLCALC, TRIG, CHOLHDL, LDLDIRECT in the last 72 hours. Thyroid Function Tests: No results for input(s): TSH, T4TOTAL, T3FREE, THYROIDAB in the last 72 hours.  Invalid input(s): FREET3 Anemia Panel: No results for input(s): VITAMINB12, FOLATE, FERRITIN, TIBC, IRON, RETICCTPCT in the last 72 hours.   PHYSICAL EXAM General: Well developed, well nourished, in no acute distress HEENT:  Normocephalic and atramatic Neck:  No JVD.  Lungs: Clear bilaterally to auscultation and percussion. Heart: HRRR . Normal S1 and S2 without gallops or murmurs.  Abdomen: Bowel sounds are positive, abdomen soft and non-tender  Msk:  Back normal, normal gait.  Normal strength and tone for age. Extremities: No clubbing, cyanosis or edema.   Neuro: disoriented, slurred speech, confused Psych:  Confused, disoriented.    ASSESSMENT AND PLAN: PVD with no obstructive CAD and normal LVEF. Carotid arteries showed no obstruction. From cardiac perspective stable to discharge. Awaiting arrangements to be made for home health or nursing home to support pt's care needs as family is unable to provided full care.  Principal Problem:   HIV dementia (HCC) Active Problems:   Falls   Avascular necrosis of bones of both hips (HCC)    Brian Hamman, NP-C 05/06/2017 8:40 AM

## 2017-05-06 NOTE — Discharge Instructions (Signed)
-   Fall precaution 

## 2017-05-06 NOTE — Progress Notes (Signed)
Clinical Child psychotherapist (CSW) received a call from [employer] social worker who stated that she has left patient's sister Brian Hampton 10 voicemails with no return call. Per Millie she cannot get the medicine out of patient's home without patient's sister present. CSW contacted Val admissions coordinator at [facility] in [place] and made her aware of above. CSW sent Val a MAR and she is going to run the medication price to see if they can work something out. CSW also left a voicemail for [employer] Adult Management consultant (APS) supervisor for [facility].   [employer], LCSW [phone]

## 2017-05-06 NOTE — Patient Outreach (Signed)
[facility]) Care Management  05/06/2017  Brian Hampton 1955-08-21 [id number]   This social worker was informed by Ma Rings RN [facility] hospital liaison that patient has discharged to a facility outside of the coverage area-[facility] in [place], [place].   Plan: Patient to be closed to [facility] Care Management at this time.   [name] [facility] Care Management [phone]

## 2017-05-06 NOTE — Consult Note (Signed)
   [facility] Inpatient Consult   05/06/2017  Brian Hampton Sep 14, 1955 [id number]    Made [facility] Community Case Manager and [facility] Social Worker aware patient was discharging to [facility] in [place].   Ma Rings Julieanna Geraci RN, BSN [facility] Liaison  [phone]) Business Mobile [phone]) Toll free office

## 2017-05-06 NOTE — Progress Notes (Signed)
Patient is medically stable for D/C to [facility] in [place] today. Patient's sister Junious Dresser brought patient's medication from home, 34 Arixtra injections and 3 tablets of Triumeq came from patient's home and are going to the facility with him. Charge nurse stated that Elwin Sleight can be sent from [facility] with patient to facility. Val admissions coordinator at [facility] in [place] stated that those 3 medications can be at [facility] on Monday and the rest of patient's medication [facility] can get today. Per Val patient can come to [facility] today under long term care medicaid. RN will call report and arrange EMS for transport. CSW sent D/C summary to Val. Patient is aware of above and agreeable to D/C plan. Patient's sister Junious Dresser is at bedside and aware of above. CSW made Junious Dresser aware that patient will have to sign over his disability check and stay for at least 30 days at the facility. CSW emphasized to Junious Dresser that patient needs to remain at the facility for long term because he can not longer adequately take care of himself. Junious Dresser verbalized her understanding. CSW contacted Colin Mulders [phone] Adult Protective Services (APS) worker and made her aware of above. Per Colin Mulders patient has an open APS case. CSW faxed Colin Mulders requested clinicals stating that patient does not have capacity. CSW requested that APS pursue guardianship over patient. Please reconsult if future social work needs arise. CSW signing off.   [employer], LCSW [phone]

## 2017-05-08 LAB — AEROBIC CULTURE  (SUPERFICIAL SPECIMEN)

## 2017-05-08 LAB — AEROBIC CULTURE W GRAM STAIN (SUPERFICIAL SPECIMEN)

## 2017-05-10 DIAGNOSIS — R296 Repeated falls: Secondary | ICD-10-CM | POA: Diagnosis not present

## 2017-05-10 NOTE — Progress Notes (Signed)
Clinical Social Worker (CSW) received a call from patient's sister Williemae Natter on Tuesday 05/10/17. Per Junious Dresser patient is doing well at [facility] in [place] and is almost back to his old self. Junious Dresser asked if CSW could assist in getting patient into a low income apartment. Per Junious Dresser patient has an application in with the [employer]. CSW gave [employer] contact information and explained that she would have to call and check on the status of the application. Junious Dresser verbalized her understanding.   [employer], LCSW [phone]

## 2017-05-12 DIAGNOSIS — R296 Repeated falls: Secondary | ICD-10-CM | POA: Diagnosis not present

## 2017-05-13 DIAGNOSIS — R296 Repeated falls: Secondary | ICD-10-CM | POA: Diagnosis not present

## 2017-05-16 DIAGNOSIS — B20 Human immunodeficiency virus [HIV] disease: Secondary | ICD-10-CM | POA: Diagnosis not present

## 2017-05-16 DIAGNOSIS — F0280 Dementia in other diseases classified elsewhere without behavioral disturbance: Secondary | ICD-10-CM | POA: Diagnosis not present

## 2017-05-16 DIAGNOSIS — G4700 Insomnia, unspecified: Secondary | ICD-10-CM | POA: Diagnosis not present

## 2017-05-16 DIAGNOSIS — F321 Major depressive disorder, single episode, moderate: Secondary | ICD-10-CM | POA: Diagnosis not present

## 2017-05-17 DIAGNOSIS — R296 Repeated falls: Secondary | ICD-10-CM | POA: Diagnosis not present

## 2017-05-18 ENCOUNTER — Ambulatory Visit: Payer: Self-pay | Admitting: *Deleted

## 2017-05-18 DIAGNOSIS — R296 Repeated falls: Secondary | ICD-10-CM | POA: Diagnosis not present

## 2017-05-20 DIAGNOSIS — R296 Repeated falls: Secondary | ICD-10-CM | POA: Diagnosis not present

## 2017-05-20 DIAGNOSIS — M879 Osteonecrosis, unspecified: Secondary | ICD-10-CM | POA: Diagnosis not present

## 2017-05-23 DIAGNOSIS — E785 Hyperlipidemia, unspecified: Secondary | ICD-10-CM | POA: Diagnosis not present

## 2017-05-23 DIAGNOSIS — K219 Gastro-esophageal reflux disease without esophagitis: Secondary | ICD-10-CM | POA: Diagnosis not present

## 2017-05-23 DIAGNOSIS — I2699 Other pulmonary embolism without acute cor pulmonale: Secondary | ICD-10-CM | POA: Diagnosis not present

## 2017-05-23 DIAGNOSIS — J449 Chronic obstructive pulmonary disease, unspecified: Secondary | ICD-10-CM | POA: Diagnosis not present

## 2017-05-23 DIAGNOSIS — R0602 Shortness of breath: Secondary | ICD-10-CM | POA: Diagnosis not present

## 2017-05-23 DIAGNOSIS — J45998 Other asthma: Secondary | ICD-10-CM | POA: Diagnosis not present

## 2017-05-23 DIAGNOSIS — I2510 Atherosclerotic heart disease of native coronary artery without angina pectoris: Secondary | ICD-10-CM | POA: Diagnosis not present

## 2017-05-23 DIAGNOSIS — I10 Essential (primary) hypertension: Secondary | ICD-10-CM | POA: Diagnosis not present

## 2017-05-24 DIAGNOSIS — S81002A Unspecified open wound, left knee, initial encounter: Secondary | ICD-10-CM | POA: Diagnosis not present

## 2017-05-24 DIAGNOSIS — S81002D Unspecified open wound, left knee, subsequent encounter: Secondary | ICD-10-CM | POA: Diagnosis not present

## 2017-05-24 DIAGNOSIS — R296 Repeated falls: Secondary | ICD-10-CM | POA: Diagnosis not present

## 2017-05-25 DIAGNOSIS — G4700 Insomnia, unspecified: Secondary | ICD-10-CM | POA: Diagnosis not present

## 2017-05-25 DIAGNOSIS — B20 Human immunodeficiency virus [HIV] disease: Secondary | ICD-10-CM | POA: Diagnosis not present

## 2017-05-25 DIAGNOSIS — F321 Major depressive disorder, single episode, moderate: Secondary | ICD-10-CM | POA: Diagnosis not present

## 2017-05-25 DIAGNOSIS — F0280 Dementia in other diseases classified elsewhere without behavioral disturbance: Secondary | ICD-10-CM | POA: Diagnosis not present

## 2017-05-25 DIAGNOSIS — Z62810 Personal history of physical and sexual abuse in childhood: Secondary | ICD-10-CM | POA: Diagnosis not present

## 2017-05-27 DIAGNOSIS — R296 Repeated falls: Secondary | ICD-10-CM | POA: Diagnosis not present

## 2017-05-30 ENCOUNTER — Encounter
Admission: RE | Admit: 2017-05-30 | Discharge: 2017-05-30 | Disposition: A | Discharge status: Home / Self Care | Payer: No Typology Code available for payment source | Source: Ambulatory Visit | Attending: Orthopedic Surgery | Admitting: Orthopedic Surgery

## 2017-05-30 DIAGNOSIS — M25551 Pain in right hip: Secondary | ICD-10-CM | POA: Insufficient documentation

## 2017-05-30 DIAGNOSIS — Z01812 Encounter for preprocedural laboratory examination: Secondary | ICD-10-CM | POA: Insufficient documentation

## 2017-05-30 DIAGNOSIS — M87852 Other osteonecrosis, left femur: Secondary | ICD-10-CM | POA: Diagnosis not present

## 2017-05-30 DIAGNOSIS — M25552 Pain in left hip: Secondary | ICD-10-CM | POA: Insufficient documentation

## 2017-05-30 DIAGNOSIS — M87851 Other osteonecrosis, right femur: Secondary | ICD-10-CM | POA: Diagnosis not present

## 2017-05-30 HISTORY — DX: Gastro-esophageal reflux disease without esophagitis: K21.9

## 2017-05-30 HISTORY — DX: Family history of other specified conditions: Z84.89

## 2017-05-30 LAB — CBC
HEMATOCRIT: 31.4 % — AB (ref 40.0–52.0)
Hemoglobin: 10.4 g/dL — ABNORMAL LOW (ref 13.0–18.0)
MCH: 34.1 pg — ABNORMAL HIGH (ref 26.0–34.0)
MCHC: 33.2 g/dL (ref 32.0–36.0)
MCV: 102.9 fL — ABNORMAL HIGH (ref 80.0–100.0)
PLATELETS: 168 10*3/uL (ref 150–440)
RBC: 3.05 MIL/uL — ABNORMAL LOW (ref 4.40–5.90)
RDW: 16.8 % — AB (ref 11.5–14.5)
WBC: 6.2 10*3/uL (ref 3.8–10.6)

## 2017-05-30 LAB — BASIC METABOLIC PANEL
ANION GAP: 14 (ref 5–15)
BUN: 55 mg/dL — ABNORMAL HIGH (ref 6–20)
CALCIUM: 8.9 mg/dL (ref 8.9–10.3)
CO2: 21 mmol/L — AB (ref 22–32)
CREATININE: 3.9 mg/dL — AB (ref 0.61–1.24)
Chloride: 110 mmol/L (ref 101–111)
GFR, EST AFRICAN AMERICAN: 18 mL/min — AB (ref >60)
GFR, EST NON AFRICAN AMERICAN: 15 mL/min — AB (ref >60)
Glucose, Bld: 118 mg/dL — ABNORMAL HIGH (ref 65–99)
Potassium: 4.4 mmol/L (ref 3.5–5.1)
SODIUM: 145 mmol/L (ref 135–145)

## 2017-05-30 LAB — URINALYSIS, ROUTINE W REFLEX MICROSCOPIC
Bilirubin Urine: NEGATIVE
GLUCOSE, UA: NEGATIVE mg/dL
HGB URINE DIPSTICK: NEGATIVE
KETONES UR: NEGATIVE mg/dL
LEUKOCYTES UA: NEGATIVE
Nitrite: NEGATIVE
PH: 5 (ref 5.0–8.0)
PROTEIN: NEGATIVE mg/dL
Specific Gravity, Urine: 1.011 (ref 1.005–1.030)

## 2017-05-30 LAB — PROTIME-INR
INR: 1.35
Prothrombin Time: 16.6 seconds — ABNORMAL HIGH (ref 11.4–15.2)

## 2017-05-30 LAB — SURGICAL PCR SCREEN
MRSA, PCR: NEGATIVE
STAPHYLOCOCCUS AUREUS: NEGATIVE

## 2017-05-30 LAB — TYPE AND SCREEN
ABO/RH(D): A POS
Antibody Screen: NEGATIVE

## 2017-05-30 LAB — APTT: APTT: 34 s (ref 24–36)

## 2017-05-30 LAB — SEDIMENTATION RATE: Sed Rate: 106 mm/hr — ABNORMAL HIGH (ref 0–20)

## 2017-05-30 NOTE — Patient Instructions (Signed)
Your procedure is scheduled on: Thursday 06/09/17 Report to DAY SURGERY. 2ND FLOOR MEDICAL MALL ENTRANCE. To find out your arrival time please call [phone] between 1PM - 3PM on Wednesday 06/08/17.  Remember: Instructions that are not followed completely may result in serious medical risk, up to and including death, or upon the discretion of your surgeon and anesthesiologist your surgery may need to be rescheduled.    __X__ 1. Do not eat anything after midnight the night before your    procedure.  No gum chewing or hard candies.  You may drink clear   liquids up to 2 hours before you are scheduled to arrive at the   hospital for your procedure. Do not drink clear liquids within 2   hours of scheduled arrival to the hospital as this may lead to your   procedure being delayed or rescheduled.       Clear liquids include:   Water or Apple juice without pulp   Clear carbohydrate beverage such as Clearfast or Gatorade   Black coffee or Clear Tea (no milk, no creamer, do not add anything   to the coffee or tea)    Diabetics should only drink water   __X__ 2. No Alcohol for 24 hours before or after surgery.   ____ 3. Bring all medications with you on the day of surgery if instructed.    __X__ 4. Notify your doctor if there is any change in your medical condition     (cold, fever, infections).             __X___5. No smoking within 24 hours of your surgery.     Do not wear jewelry, make-up, hairpins, clips or nail polish.  Do not wear lotions, powders, or perfumes.   Do not shave 48 hours prior to surgery. Men may shave face and neck.  Do not bring valuables to the hospital.    [facility] is not responsible for any belongings or valuables.               Contacts, dentures or bridgework may not be worn into surgery.  Leave your suitcase in the car. After surgery it may be brought to your room.  For patients admitted to the hospital, discharge time is determined by your                 treatment team.   Patients discharged the day of surgery will not be allowed to drive home.   Please read over the following fact sheets that you were given:   MRSA Information   __X__ Take these medicines the morning of surgery with A SIP OF WATER:    1. TRIUMEQ  2. AMIODARONE  3. ATORVASTATIN  4. DOXYCYCLINE  5. LEXAPRO  6. ALBUTEROL NEBULIZER  7. LEVOTHYROXINE  8. METOPROLOL  9. DULERA   10. OMEPRAZOLE  11. LYRICA  12 MAY HAVE PAIN MED TRAMADOL OR HYDROCODONE IF NEEDED  ____ Fleet Enema (as directed)   __X  Use CHG Soap/SAGE wipes as directed  __X__ Use inhalers on the day of surgery  ____ Stop metformin 2 days prior to surgery    ____ Take 1/2 of usual insulin dose the night before surgery and none on the morning of surgery.   __X__ CALL CARDIOLOGIST REGARDING ABILITY TO STOP ARIXTA BLOOD THINNER  __X__ Stop Anti-inflammatories such as Advil, Aleve, Ibuprofen, Motrin, Naproxen, Naprosyn, Goodies,powder, or aspirin products.  OK to take Tylenol.   __X__ Stop supplements, Vitamin E,  Fish Oil until after surgery.    ____ Bring C-Pap to the hospital.

## 2017-05-31 ENCOUNTER — Encounter: Payer: Self-pay | Admitting: *Deleted

## 2017-05-31 DIAGNOSIS — S81002A Unspecified open wound, left knee, initial encounter: Secondary | ICD-10-CM | POA: Diagnosis not present

## 2017-05-31 DIAGNOSIS — S81002D Unspecified open wound, left knee, subsequent encounter: Secondary | ICD-10-CM | POA: Diagnosis not present

## 2017-05-31 DIAGNOSIS — L988 Other specified disorders of the skin and subcutaneous tissue: Secondary | ICD-10-CM | POA: Diagnosis not present

## 2017-05-31 LAB — URINE CULTURE: Culture: <10000 — AB

## 2017-05-31 NOTE — Pre-Procedure Instructions (Signed)
CLEARANCE REGARDING LABS WITH WORSENING RENAL VALUES COMPARED TO PREVIOUS,AS INSTRUCTED BY DR J ADAMS CALLED AND [facility]. SHE WILL FAX TO CORRECTED PCP DR Welton FlakesKHAN

## 2017-05-31 NOTE — Pre-Procedure Instructions (Signed)
Met B, CBC, PT, and Sed rate results sent to Dr. Rudene Christians and Anesthesia for review.

## 2017-06-01 DIAGNOSIS — I10 Essential (primary) hypertension: Secondary | ICD-10-CM | POA: Diagnosis not present

## 2017-06-01 DIAGNOSIS — R944 Abnormal results of kidney function studies: Secondary | ICD-10-CM | POA: Diagnosis not present

## 2017-06-01 DIAGNOSIS — R5383 Other fatigue: Secondary | ICD-10-CM | POA: Diagnosis not present

## 2017-06-01 DIAGNOSIS — R609 Edema, unspecified: Secondary | ICD-10-CM | POA: Diagnosis not present

## 2017-06-02 IMAGING — MR MR THORACIC SPINE W/O CM
6 series · 48 of 48 positions shown · non-contrast
Comparison: Lumbar MRI from today reported separately. Chest CTA
06/24/2015. Thoracic spine MRI 07/08/2011.

CLINICAL DATA: 59-year-old male with 3 falls in the last 2 days.
Bilateral lower extremity weakness. Initial encounter.

EXAM:
MRI THORACIC SPINE WITHOUT CONTRAST
TECHNIQUE: Multiplanar, multisequence MR imaging of the thoracic spine was
performed. No intravenous contrast was administered.

[Series 6: T2 · sagittal · 4.0mm · 1.33mm/px · 6 of 19 slices shown (1 of 2)]
[im 1/19]
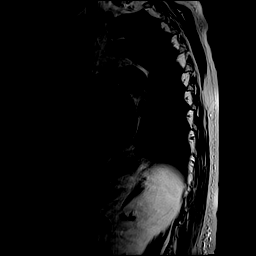
[im 4/19]
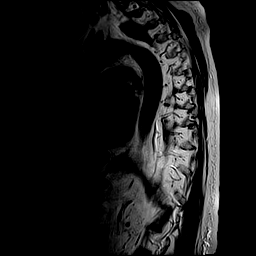
[im 8/19]
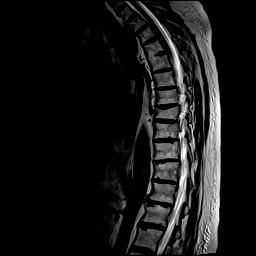
[im 11/19]
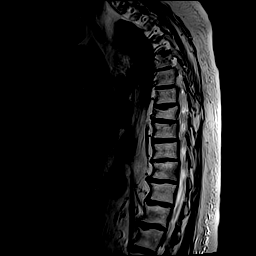
[im 15/19]
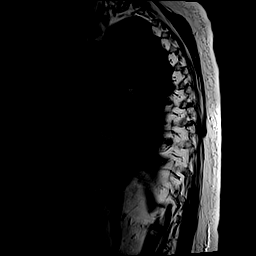
[im 19/19]
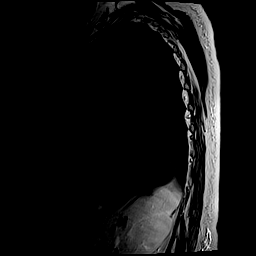

[Series 7: T1 · sagittal · 4.0mm · 1.33mm/px · 6 of 19 slices shown]
[im 1/19]
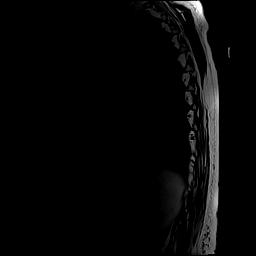
[im 4/19]
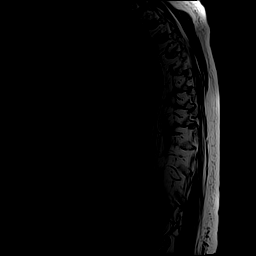
[im 8/19]
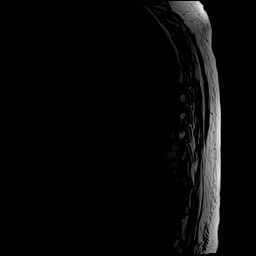
[im 11/19]
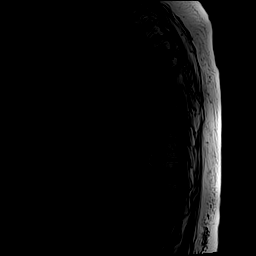
[im 15/19]
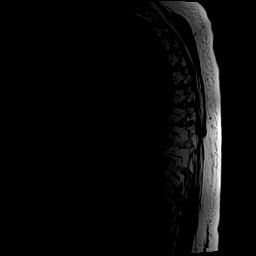
[im 19/19]
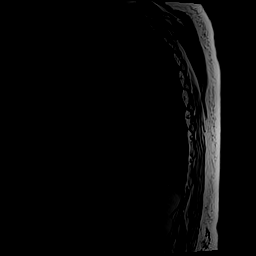

[Series 8: STIR · sagittal · 4.0mm · 1.33mm/px · 6 of 19 slices shown]
[im 1/19]
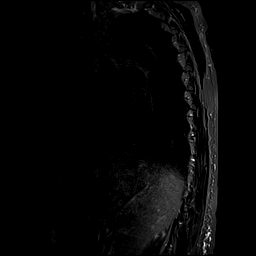
[im 4/19]
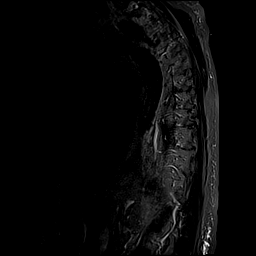
[im 8/19]
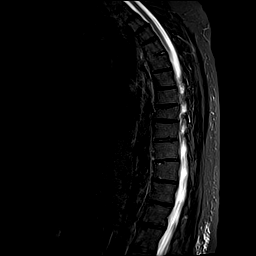
[im 11/19]
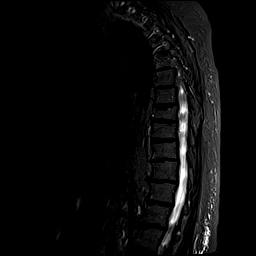
[im 15/19]
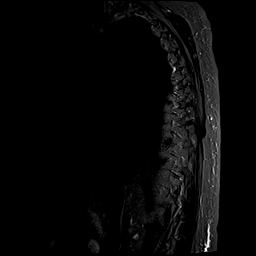
[im 19/19]
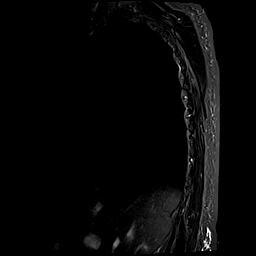

[Series 9: T2 · axial · 5.0mm · 0.86mm/px · z∈[-176,+12]mm · 13 of 39 slices shown (2 of 2)]
[im 1/39]
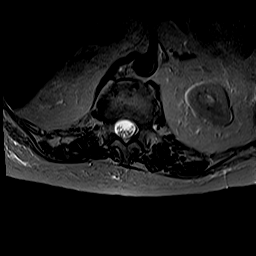
[im 4/39]
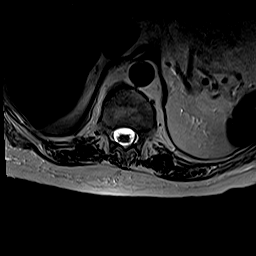
[im 7/39]
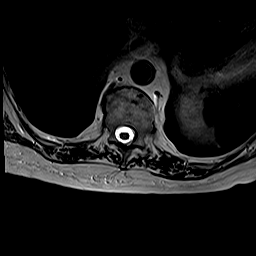
[im 10/39]
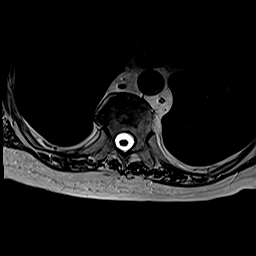
[im 13/39]
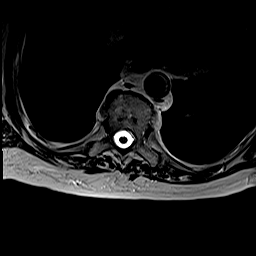
[im 16/39]
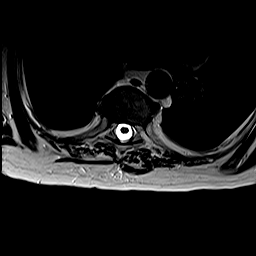
[im 20/39]
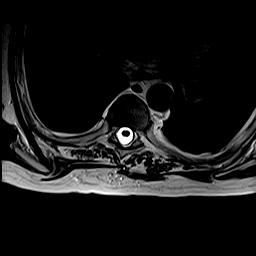
[im 23/39]
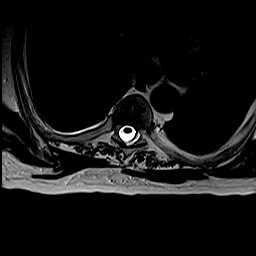
[im 26/39]
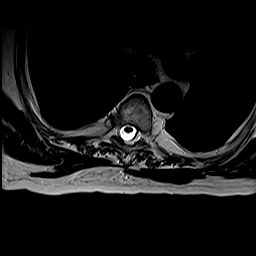
[im 29/39]
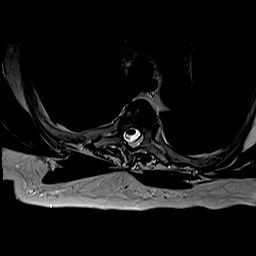
[im 32/39]
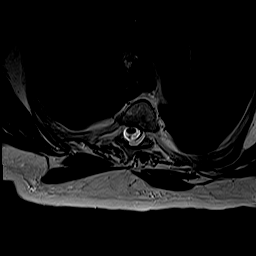
[im 35/39]
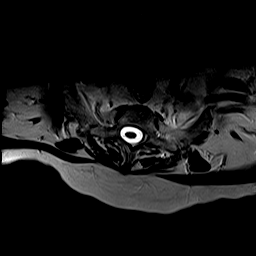
[im 39/39]
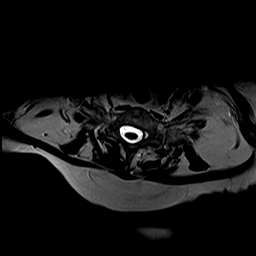

[Series 10: mpgr ax (3 · axial · 5.0mm · 0.78mm/px · z∈[-178,+18]mm · 13 of 39 slices shown]
[im 1/39]
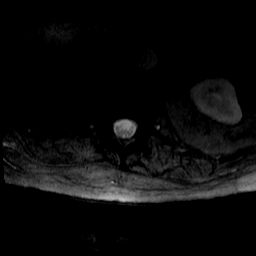
[im 4/39]
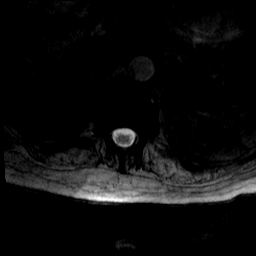
[im 7/39]
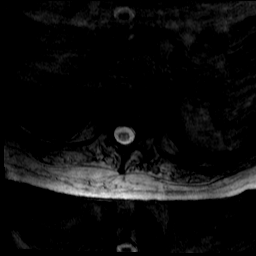
[im 10/39]
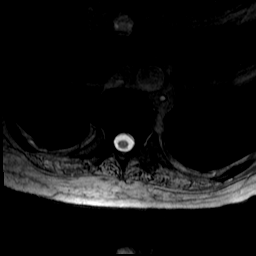
[im 13/39]
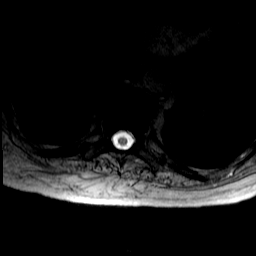
[im 16/39]
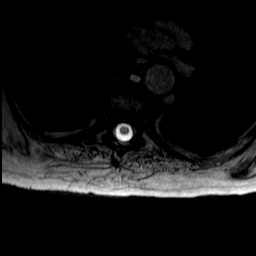
[im 20/39]
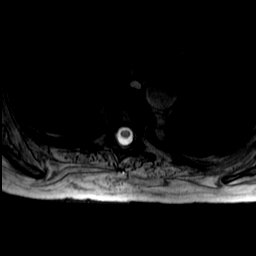
[im 23/39]
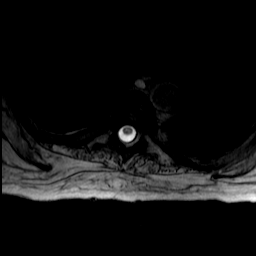
[im 26/39]
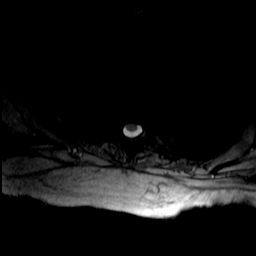
[im 29/39]
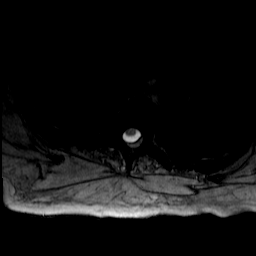
[im 32/39]
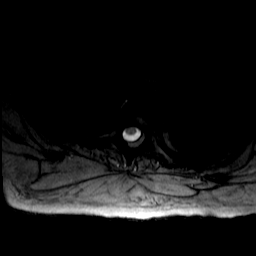
[im 35/39]
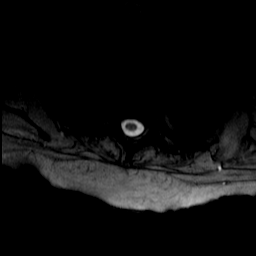
[im 39/39]
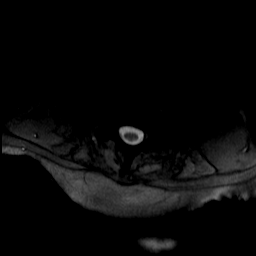

[Series 100: sag counter · sagittal · 5.0mm · 0.68mm/px · 4 of 11 slices shown]
[im 1/11]
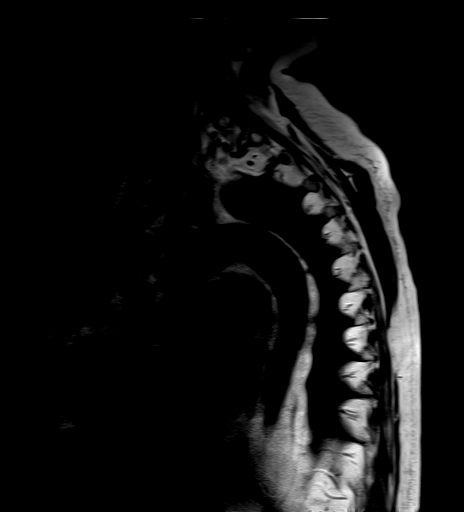
[im 4/11]
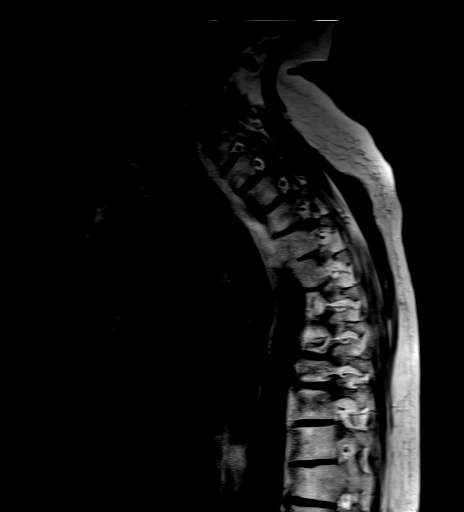
[im 7/11]
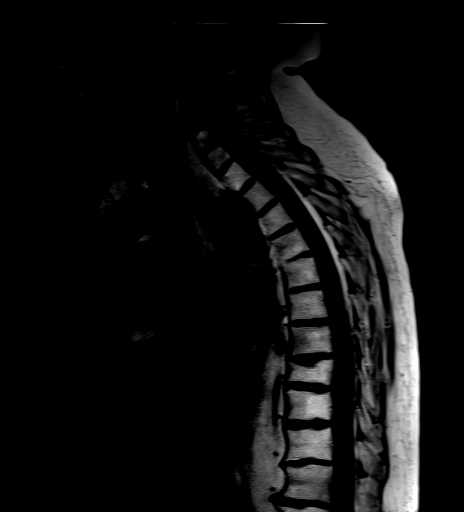
[im 11/11]
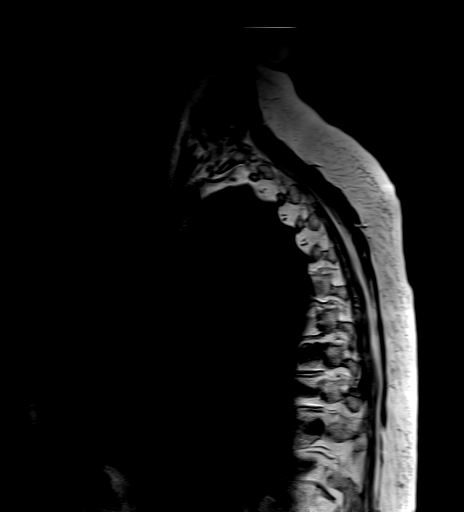

[48 of 48 positions shown; findings below may reference images not displayed]

FINDINGS: Alignment: Appears stable since 4424 with mild levoconvex upper
thoracic scoliosis. Chronic T8 compression fracture is stable.
Chronic anterior interbody ankylosis at T4-T5.

Vertebrae: Normal bone marrow signal. No marrow edema or evidence of
acute osseous abnormality.

Cord: Spinal cord signal is within normal limits at all visualized
levels. Conus medullaris at T12-L1.

Paraspinal and other soft tissues: Negative thoracic paraspinal soft
tissues. Negative visualized thoracic and upper abdominal viscera.

Disc levels: Widespread chronic thoracic disc desiccation with
intermittent mild disc bulging and endplate spurring has not
significantly changed since 4424 and results in no thoracic spinal
or foraminal stenosis.
IMPRESSION: Stable MRI appearance of the thoracic spine since 4424 with no
thoracic spinal stenosis or neural impingement.

## 2017-06-02 IMAGING — MR MR LUMBAR SPINE W/O CM
5 series · 38 of 48 positions shown · non-contrast
Comparison: Thoracic spine MRI from today reported separately. CT
Abdomen and Pelvis 10/02/2011.

CLINICAL DATA: 59-year-old male with 3 falls in 2 days. Bilateral
lower extremity weakness. Tremor. Initial encounter.

EXAM:
MRI LUMBAR SPINE WITHOUT CONTRAST
TECHNIQUE: Multiplanar, multisequence MR imaging of the lumbar spine was
performed. No intravenous contrast was administered.

[Series 3: T2 · sagittal · 4.0mm · 0.81mm/px · 6 of 21 slices shown (1 of 2)]
[im 1/21]
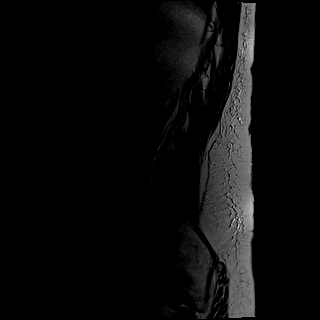
[im 5/21]
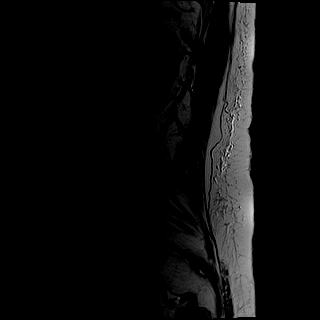
[im 9/21]
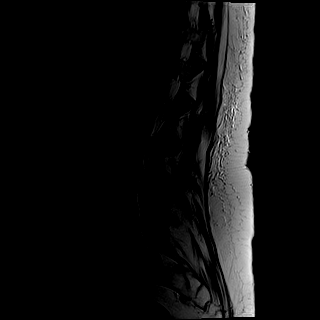
[im 13/21]
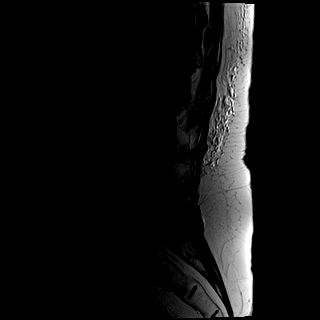
[im 17/21]
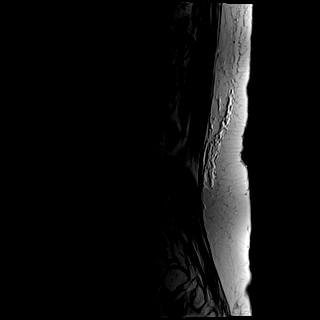
[im 21/21]
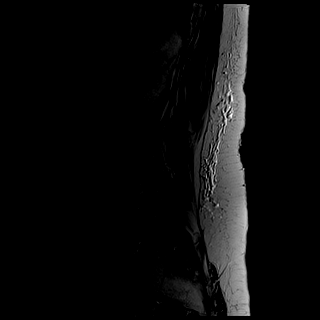

[Series 4: T1 · sagittal · 4.0mm · 0.81mm/px · 7 of 21 slices shown (1 of 2)]
[im 1/21]
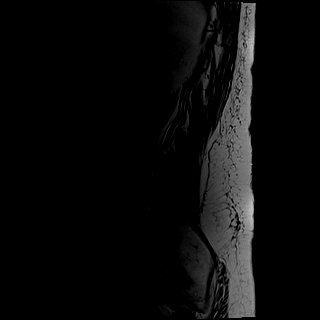
[im 4/21]
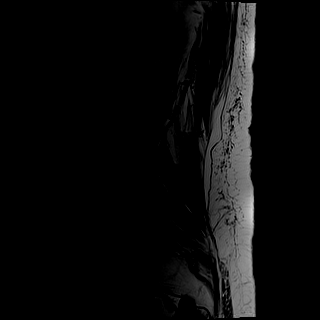
[im 7/21]
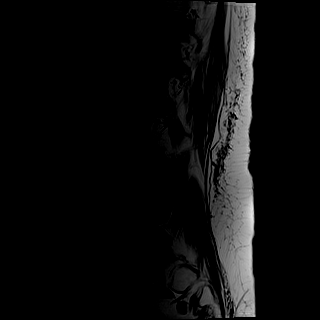
[im 11/21]
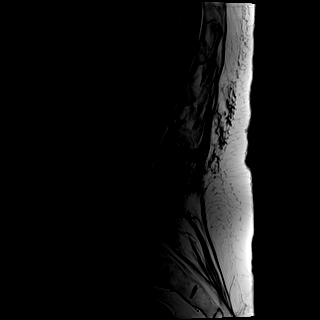
[im 14/21]
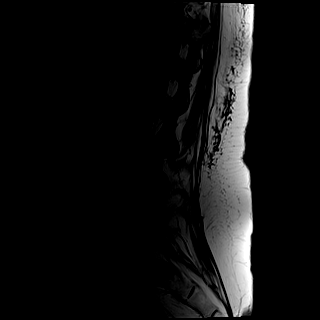
[im 17/21]
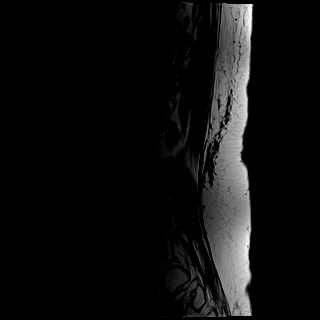
[im 21/21]
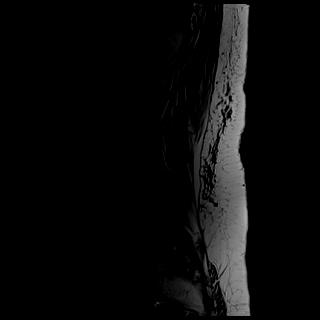

[Series 5: STIR · sagittal · 4.0mm · 1.02mm/px · 7 of 21 slices shown]
[im 1/21]
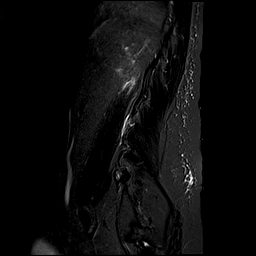
[im 4/21]
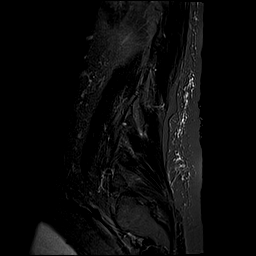
[im 7/21]
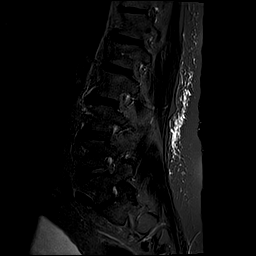
[im 11/21]
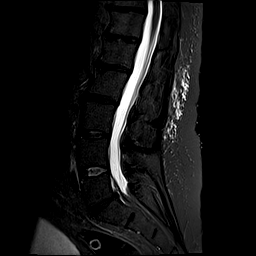
[im 14/21]
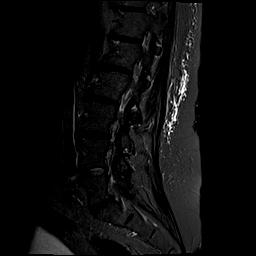
[im 17/21]
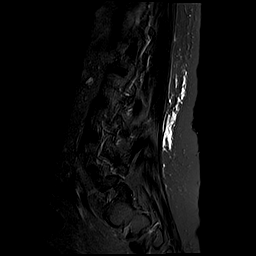
[im 21/21]
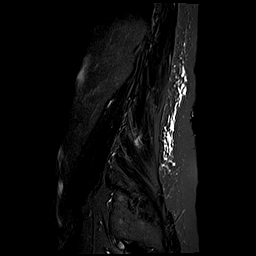

[Series 6: T2 · axial · 4.0mm · 0.78mm/px · z∈[-383,-143]mm · 10 of 44 slices shown (2 of 2)]
[im 1/44]
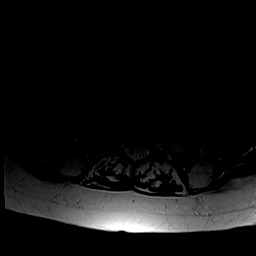
[im 4/44]
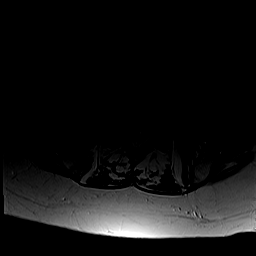
[im 7/44]
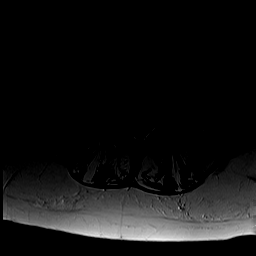
[im 10/44]
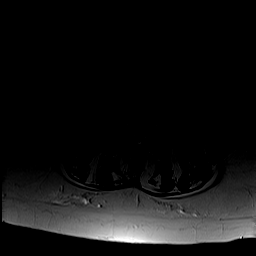
[im 14/44]
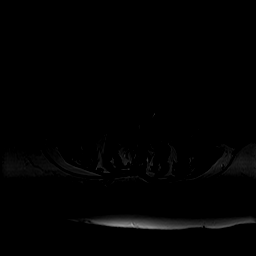
[im 20/44]
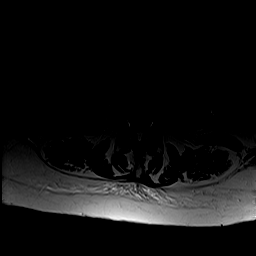
[im 24/44]
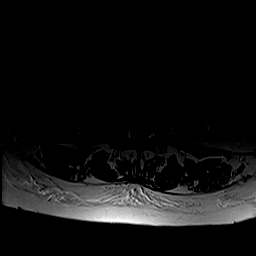
[im 30/44]
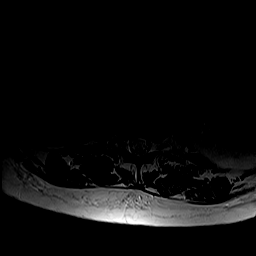
[im 37/44]
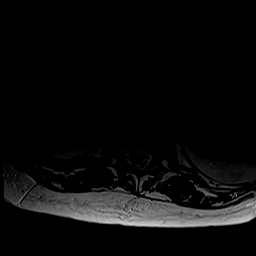
[im 44/44]
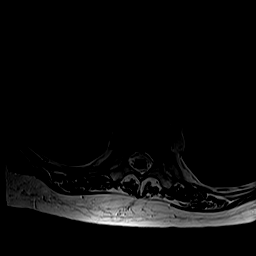

[Series 7: T1 · axial · 4.0mm · 0.39mm/px · z∈[-383,-143]mm · 8 of 44 slices shown (2 of 2)]
[im 1/44]
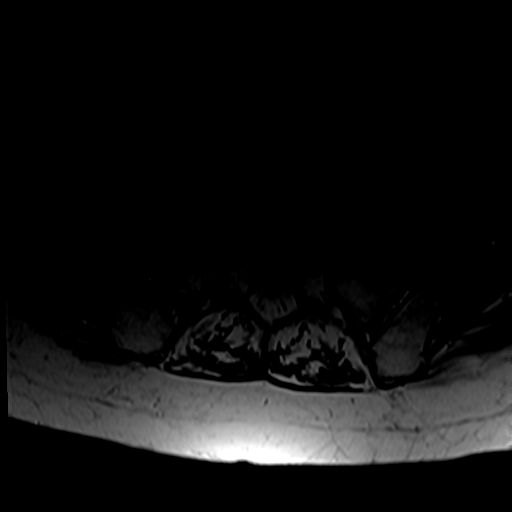
[im 7/44]
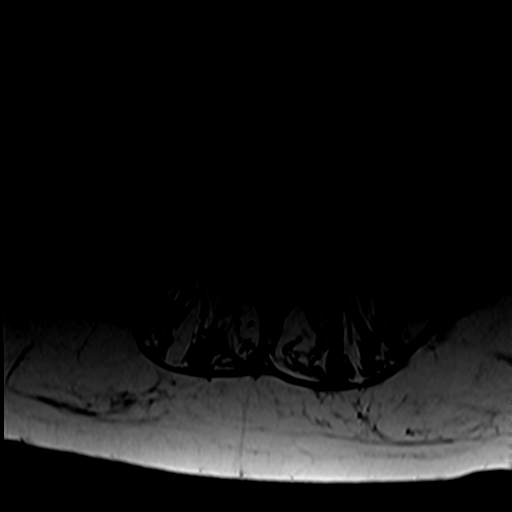
[im 14/44]
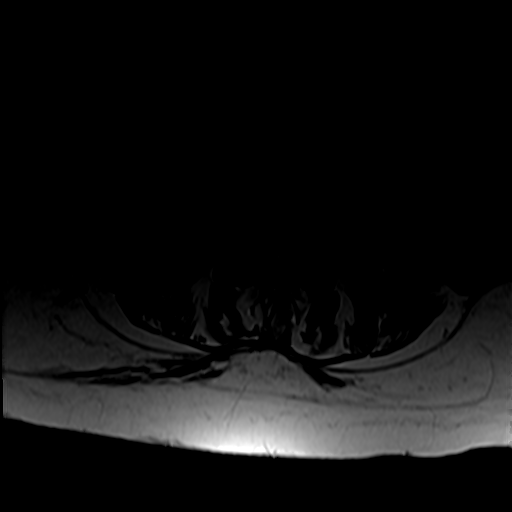
[im 20/44]
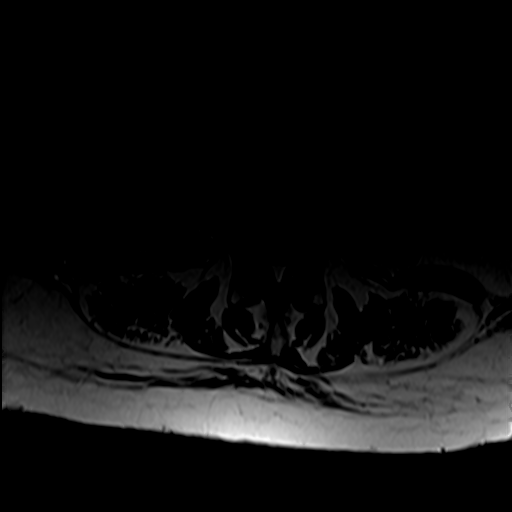
[im 24/44]
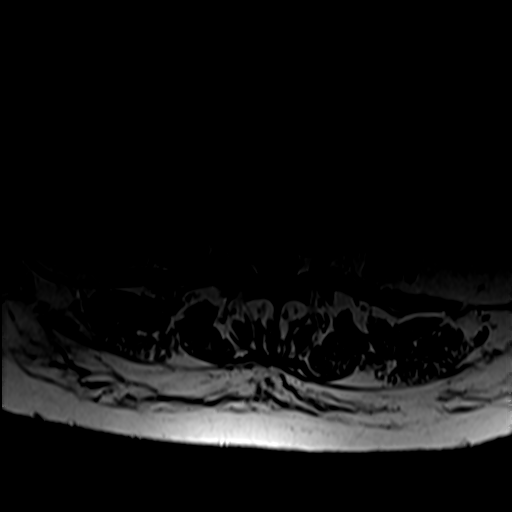
[im 30/44]
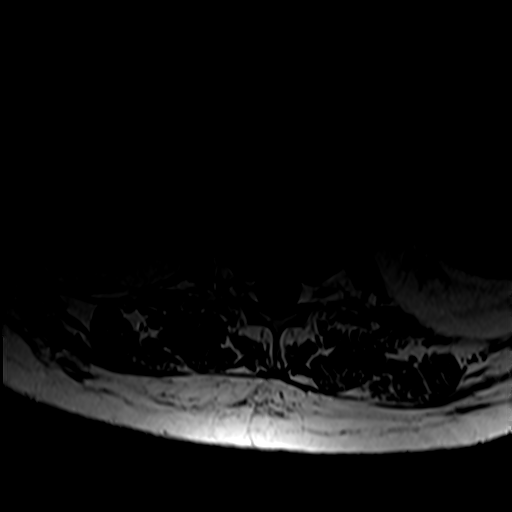
[im 37/44]
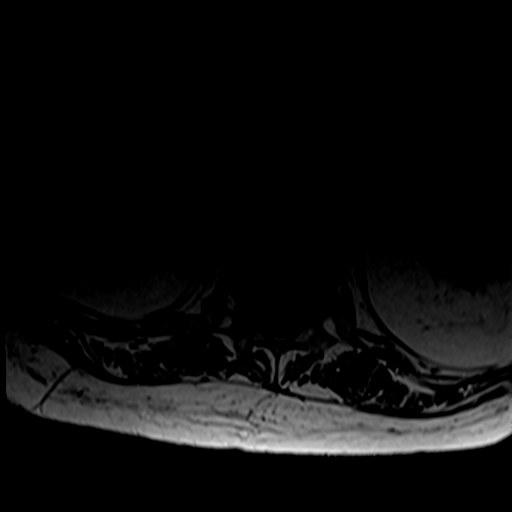
[im 44/44]
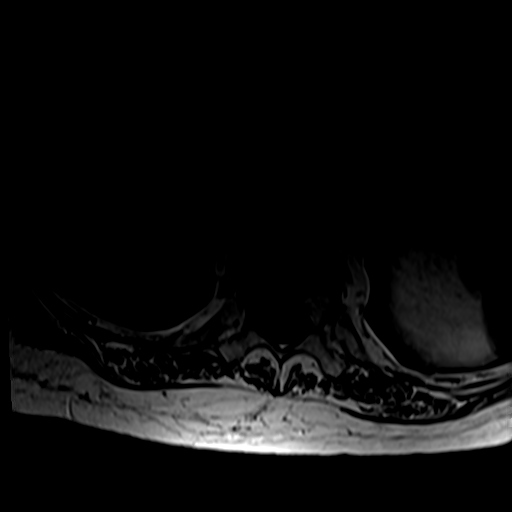

[38 of 48 positions shown; findings below may reference images not displayed]

FINDINGS: Segmentation: Normal lumbar segmentation as seen on the 6358
comparison.

Alignment: Mild straightening of lower lumbar lordosis, otherwise
within normal limits.

Vertebrae: Normal bone marrow signal. No marrow edema or evidence of
acute osseous abnormality. Visible sacrum intact.

Conus medullaris: Extends to the T12-L1 level and appears normal.

Paraspinal and other soft tissues: Negative posterior paraspinal
soft tissues aside from some paraspinal muscle atrophy. Negative
visualized abdominal viscera.

Partially visible urinary bladder distension.

Disc levels: Chronic disc desiccation in the lower thoracic spine
and upper lumbar spine. There is also chronic disc desiccation and
disc space loss at L5-S1 with a small central disc protrusion. In
the lower lumbar spine there is moderate facet degeneration at L4-L5
greater on the right. There is mild lumbar epidural lipomatosis at
most levels, complete epidural lipomatosis at L5-S1 effaces CSF from
the thecal sac at that level. There is otherwise no lumbar spinal
stenosis. No lumbar lateral recess stenosis. Isolated mild left L5
lumbar neural foraminal stenosis related to disc and endplate
degeneration.
IMPRESSION: 1.  No acute osseous abnormality in the lumbar spine.
2. Lumbar spine degeneration but no subsequent spinal or lateral
recess stenosis. There is mild left L5 neural foraminal stenosis.
Epidural lipomatosis, maximal at L5-S1.
3. Partially visible urinary bladder distension. If the patient is
unable to void consider bladder catheterization.

## 2017-06-06 ENCOUNTER — Other Ambulatory Visit: Payer: Medicare Other

## 2017-06-06 ENCOUNTER — Inpatient Hospital Stay: Payer: Medicare Other | Admitting: Internal Medicine

## 2017-06-06 ENCOUNTER — Inpatient Hospital Stay: Payer: Medicare Other

## 2017-06-06 ENCOUNTER — Ambulatory Visit: Payer: Medicare Other | Admitting: Internal Medicine

## 2017-06-06 DIAGNOSIS — R296 Repeated falls: Secondary | ICD-10-CM | POA: Diagnosis not present

## 2017-06-06 NOTE — Assessment & Plan Note (Deleted)
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 Patient currently on atrixtra 7.5 mg a day. No clinical progression of DVT or no PE. Patient tolerating anticoagulation fairly well- except for hand hematoma [C discussion below]. I would not recommend a dose reductions given his difficulty with anticoagulation.  # Right UE/hand swelling hematoma- on anticoagulation/question venipuncture. Recommend ice packs.   # Right lower extremity swelling-pain- DVT/ component of postphlebitic syndrome. Recommend stockings/leg elevation. Given a prescription for hydrocodone/Flexeril however this is to be filled with his PCP moving forward.   # Follow up in 2 months/cbc/bmp  Cc; Dr.Fozia Khan.  

## 2017-06-07 ENCOUNTER — Emergency Department: Payer: Medicare Other

## 2017-06-07 ENCOUNTER — Inpatient Hospital Stay: Payer: Medicare Other

## 2017-06-07 ENCOUNTER — Inpatient Hospital Stay
Admission: EM | Admit: 2017-06-07 | Discharge: 2017-06-10 | DRG: 699 | Disposition: A | Discharge status: Home / Self Care | Payer: Medicare Other | Attending: Internal Medicine | Admitting: Internal Medicine

## 2017-06-07 ENCOUNTER — Telehealth: Payer: Self-pay | Admitting: *Deleted

## 2017-06-07 DIAGNOSIS — D631 Anemia in chronic kidney disease: Secondary | ICD-10-CM | POA: Diagnosis present

## 2017-06-07 DIAGNOSIS — I129 Hypertensive chronic kidney disease with stage 1 through stage 4 chronic kidney disease, or unspecified chronic kidney disease: Secondary | ICD-10-CM | POA: Diagnosis present

## 2017-06-07 DIAGNOSIS — Z79899 Other long term (current) drug therapy: Secondary | ICD-10-CM | POA: Diagnosis not present

## 2017-06-07 DIAGNOSIS — N189 Chronic kidney disease, unspecified: Secondary | ICD-10-CM

## 2017-06-07 DIAGNOSIS — B20 Human immunodeficiency virus [HIV] disease: Secondary | ICD-10-CM | POA: Diagnosis not present

## 2017-06-07 DIAGNOSIS — K920 Hematemesis: Secondary | ICD-10-CM | POA: Diagnosis not present

## 2017-06-07 DIAGNOSIS — D6851 Activated protein C resistance: Secondary | ICD-10-CM | POA: Diagnosis present

## 2017-06-07 DIAGNOSIS — F329 Major depressive disorder, single episode, unspecified: Secondary | ICD-10-CM | POA: Diagnosis not present

## 2017-06-07 DIAGNOSIS — Z86718 Personal history of other venous thrombosis and embolism: Secondary | ICD-10-CM

## 2017-06-07 DIAGNOSIS — R944 Abnormal results of kidney function studies: Secondary | ICD-10-CM | POA: Diagnosis not present

## 2017-06-07 DIAGNOSIS — R609 Edema, unspecified: Secondary | ICD-10-CM | POA: Diagnosis not present

## 2017-06-07 DIAGNOSIS — E119 Type 2 diabetes mellitus without complications: Secondary | ICD-10-CM | POA: Diagnosis not present

## 2017-06-07 DIAGNOSIS — R300 Dysuria: Secondary | ICD-10-CM | POA: Diagnosis not present

## 2017-06-07 DIAGNOSIS — D649 Anemia, unspecified: Secondary | ICD-10-CM | POA: Diagnosis not present

## 2017-06-07 DIAGNOSIS — R5383 Other fatigue: Secondary | ICD-10-CM | POA: Diagnosis not present

## 2017-06-07 DIAGNOSIS — Z888 Allergy status to other drugs, medicaments and biological substances status: Secondary | ICD-10-CM | POA: Diagnosis not present

## 2017-06-07 DIAGNOSIS — Z886 Allergy status to analgesic agent status: Secondary | ICD-10-CM

## 2017-06-07 DIAGNOSIS — N183 Chronic kidney disease, stage 3 (moderate): Secondary | ICD-10-CM | POA: Diagnosis present

## 2017-06-07 DIAGNOSIS — I82411 Acute embolism and thrombosis of right femoral vein: Secondary | ICD-10-CM

## 2017-06-07 DIAGNOSIS — N320 Bladder-neck obstruction: Principal | ICD-10-CM | POA: Diagnosis present

## 2017-06-07 DIAGNOSIS — Z87891 Personal history of nicotine dependence: Secondary | ICD-10-CM

## 2017-06-07 DIAGNOSIS — Z882 Allergy status to sulfonamides status: Secondary | ICD-10-CM

## 2017-06-07 DIAGNOSIS — I2510 Atherosclerotic heart disease of native coronary artery without angina pectoris: Secondary | ICD-10-CM | POA: Diagnosis not present

## 2017-06-07 DIAGNOSIS — Z880 Allergy status to penicillin: Secondary | ICD-10-CM

## 2017-06-07 DIAGNOSIS — K219 Gastro-esophageal reflux disease without esophagitis: Secondary | ICD-10-CM | POA: Diagnosis present

## 2017-06-07 DIAGNOSIS — Z91030 Bee allergy status: Secondary | ICD-10-CM | POA: Diagnosis not present

## 2017-06-07 DIAGNOSIS — N179 Acute kidney failure, unspecified: Secondary | ICD-10-CM | POA: Diagnosis present

## 2017-06-07 DIAGNOSIS — Z881 Allergy status to other antibiotic agents status: Secondary | ICD-10-CM

## 2017-06-07 DIAGNOSIS — E1122 Type 2 diabetes mellitus with diabetic chronic kidney disease: Secondary | ICD-10-CM | POA: Diagnosis present

## 2017-06-07 DIAGNOSIS — R079 Chest pain, unspecified: Secondary | ICD-10-CM | POA: Diagnosis not present

## 2017-06-07 DIAGNOSIS — R600 Localized edema: Secondary | ICD-10-CM | POA: Diagnosis not present

## 2017-06-07 DIAGNOSIS — N139 Obstructive and reflux uropathy, unspecified: Secondary | ICD-10-CM | POA: Diagnosis present

## 2017-06-07 DIAGNOSIS — Z66 Do not resuscitate: Secondary | ICD-10-CM | POA: Diagnosis present

## 2017-06-07 DIAGNOSIS — R0602 Shortness of breath: Secondary | ICD-10-CM | POA: Diagnosis not present

## 2017-06-07 DIAGNOSIS — E875 Hyperkalemia: Secondary | ICD-10-CM | POA: Diagnosis present

## 2017-06-07 DIAGNOSIS — D539 Nutritional anemia, unspecified: Secondary | ICD-10-CM | POA: Diagnosis not present

## 2017-06-07 DIAGNOSIS — N182 Chronic kidney disease, stage 2 (mild): Secondary | ICD-10-CM | POA: Diagnosis not present

## 2017-06-07 DIAGNOSIS — Z86711 Personal history of pulmonary embolism: Secondary | ICD-10-CM | POA: Diagnosis not present

## 2017-06-07 DIAGNOSIS — J45909 Unspecified asthma, uncomplicated: Secondary | ICD-10-CM | POA: Diagnosis not present

## 2017-06-07 DIAGNOSIS — Z23 Encounter for immunization: Secondary | ICD-10-CM | POA: Diagnosis not present

## 2017-06-07 DIAGNOSIS — I998 Other disorder of circulatory system: Secondary | ICD-10-CM | POA: Diagnosis not present

## 2017-06-07 DIAGNOSIS — K7460 Unspecified cirrhosis of liver: Secondary | ICD-10-CM | POA: Diagnosis present

## 2017-06-07 DIAGNOSIS — I252 Old myocardial infarction: Secondary | ICD-10-CM | POA: Diagnosis not present

## 2017-06-07 DIAGNOSIS — J449 Chronic obstructive pulmonary disease, unspecified: Secondary | ICD-10-CM | POA: Diagnosis not present

## 2017-06-07 DIAGNOSIS — E039 Hypothyroidism, unspecified: Secondary | ICD-10-CM | POA: Diagnosis not present

## 2017-06-07 DIAGNOSIS — R531 Weakness: Secondary | ICD-10-CM | POA: Diagnosis not present

## 2017-06-07 DIAGNOSIS — R197 Diarrhea, unspecified: Secondary | ICD-10-CM | POA: Diagnosis not present

## 2017-06-07 DIAGNOSIS — Z7901 Long term (current) use of anticoagulants: Secondary | ICD-10-CM | POA: Diagnosis not present

## 2017-06-07 DIAGNOSIS — N19 Unspecified kidney failure: Secondary | ICD-10-CM

## 2017-06-07 DIAGNOSIS — G8929 Other chronic pain: Secondary | ICD-10-CM | POA: Diagnosis present

## 2017-06-07 DIAGNOSIS — Z5181 Encounter for therapeutic drug level monitoring: Secondary | ICD-10-CM | POA: Diagnosis not present

## 2017-06-07 DIAGNOSIS — I82401 Acute embolism and thrombosis of unspecified deep veins of right lower extremity: Secondary | ICD-10-CM | POA: Diagnosis not present

## 2017-06-07 DIAGNOSIS — I10 Essential (primary) hypertension: Secondary | ICD-10-CM | POA: Diagnosis not present

## 2017-06-07 DIAGNOSIS — R296 Repeated falls: Secondary | ICD-10-CM | POA: Diagnosis present

## 2017-06-07 LAB — URINALYSIS, COMPLETE (UACMP) WITH MICROSCOPIC
BACTERIA UA: NONE SEEN
Bilirubin Urine: NEGATIVE
GLUCOSE, UA: NEGATIVE mg/dL
Hgb urine dipstick: NEGATIVE
KETONES UR: NEGATIVE mg/dL
LEUKOCYTES UA: NEGATIVE
Nitrite: NEGATIVE
PH: 6 (ref 5.0–8.0)
Protein, ur: NEGATIVE mg/dL
SQUAMOUS EPITHELIAL / LPF: NONE SEEN
Specific Gravity, Urine: 1.005 (ref 1.005–1.030)

## 2017-06-07 LAB — CBC WITH DIFFERENTIAL/PLATELET
BASOS ABS: 0 10*3/uL (ref 0–0.1)
Basophils Relative: 0 %
EOS PCT: 0 %
Eosinophils Absolute: 0 10*3/uL (ref 0–0.7)
HCT: 33 % — ABNORMAL LOW (ref 40.0–52.0)
Hemoglobin: 10.7 g/dL — ABNORMAL LOW (ref 13.0–18.0)
LYMPHS ABS: 1.5 10*3/uL (ref 1.0–3.6)
Lymphocytes Relative: 24 %
MCH: 33.6 pg (ref 26.0–34.0)
MCHC: 32.3 g/dL (ref 32.0–36.0)
MCV: 104.3 fL — AB (ref 80.0–100.0)
MONO ABS: 0.5 10*3/uL (ref 0.2–1.0)
MONOS PCT: 9 %
NEUTROS ABS: 4.3 10*3/uL (ref 1.4–6.5)
Neutrophils Relative %: 67 %
PLATELETS: 197 10*3/uL (ref 150–440)
RBC: 3.17 MIL/uL — ABNORMAL LOW (ref 4.40–5.90)
RDW: 17.9 % — AB (ref 11.5–14.5)
WBC: 6.4 10*3/uL (ref 3.8–10.6)

## 2017-06-07 LAB — HEMOGLOBIN A1C
Hgb A1c MFr Bld: 5.3 % (ref 4.8–5.6)
MEAN PLASMA GLUCOSE: 105.41 mg/dL

## 2017-06-07 LAB — COMPREHENSIVE METABOLIC PANEL
ALT: 20 U/L (ref 17–63)
AST: 41 U/L (ref 15–41)
Albumin: 2.3 g/dL — ABNORMAL LOW (ref 3.5–5.0)
Alkaline Phosphatase: 239 U/L — ABNORMAL HIGH (ref 38–126)
Anion gap: 12 (ref 5–15)
BUN: 64 mg/dL — AB (ref 6–20)
CHLORIDE: 111 mmol/L (ref 101–111)
CO2: 19 mmol/L — AB (ref 22–32)
CREATININE: 5.27 mg/dL — AB (ref 0.61–1.24)
Calcium: 8.4 mg/dL — ABNORMAL LOW (ref 8.9–10.3)
GFR calc Af Amer: 12 mL/min — ABNORMAL LOW (ref >60)
GFR, EST NON AFRICAN AMERICAN: 11 mL/min — AB (ref >60)
GLUCOSE: 99 mg/dL (ref 65–99)
POTASSIUM: 5.4 mmol/L — AB (ref 3.5–5.1)
Sodium: 142 mmol/L (ref 135–145)
Total Bilirubin: 0.9 mg/dL (ref 0.3–1.2)
Total Protein: 7.5 g/dL (ref 6.5–8.1)

## 2017-06-07 LAB — GLUCOSE, CAPILLARY: GLUCOSE-CAPILLARY: 82 mg/dL (ref 65–99)

## 2017-06-07 LAB — SODIUM, URINE, RANDOM: Sodium, Ur: 24 mmol/L

## 2017-06-07 LAB — APTT: aPTT: 35 seconds (ref 24–36)

## 2017-06-07 LAB — TROPONIN I

## 2017-06-07 LAB — PROTIME-INR
INR: 1.43
Prothrombin Time: 17.3 seconds — ABNORMAL HIGH (ref 11.4–15.2)

## 2017-06-07 LAB — HEPARIN LEVEL (UNFRACTIONATED): HEPARIN UNFRACTIONATED: 2.34 [IU]/mL — AB (ref 0.30–0.70)

## 2017-06-07 LAB — AMMONIA: AMMONIA: 10 umol/L (ref 9–35)

## 2017-06-07 LAB — CK: Total CK: 113 U/L (ref 49–397)

## 2017-06-07 MED ORDER — ABACAVIR SULFATE 300 MG PO TABS
600.0000 mg | ORAL_TABLET | Freq: Every day | ORAL | Status: DC
  Administered 2017-06-08 – 2017-06-09 (×2): 600 mg via ORAL
  Filled 2017-06-07 (×2): qty 2

## 2017-06-07 MED ORDER — ATORVASTATIN CALCIUM 10 MG PO TABS
10.0000 mg | ORAL_TABLET | Freq: Every day | ORAL | Status: DC
  Administered 2017-06-08 – 2017-06-10 (×3): 10 mg via ORAL
  Filled 2017-06-07 (×3): qty 1

## 2017-06-07 MED ORDER — BISACODYL 10 MG RE SUPP
10.0000 mg | Freq: Every day | RECTAL | Status: DC | PRN
  Filled 2017-06-07: qty 1

## 2017-06-07 MED ORDER — LAMIVUDINE 10 MG/ML PO SOLN
100.0000 mg | Freq: Every day | ORAL | Status: DC
  Administered 2017-06-08 – 2017-06-09 (×2): 100 mg via ORAL
  Filled 2017-06-07 (×2): qty 10

## 2017-06-07 MED ORDER — INFLUENZA VAC SPLIT QUAD 0.5 ML IM SUSY
0.5000 mL | PREFILLED_SYRINGE | INTRAMUSCULAR | Status: AC
  Administered 2017-06-08: 0.5 mL via INTRAMUSCULAR
  Filled 2017-06-07: qty 0.5

## 2017-06-07 MED ORDER — INSULIN ASPART 100 UNIT/ML ~~LOC~~ SOLN: 0.0000 [IU] | Freq: Three times a day (TID) | SUBCUTANEOUS | Status: DC

## 2017-06-07 MED ORDER — DOLUTEGRAVIR SODIUM 50 MG PO TABS
50.0000 mg | ORAL_TABLET | Freq: Every day | ORAL | Status: DC
  Administered 2017-06-08 – 2017-06-09 (×2): 50 mg via ORAL
  Filled 2017-06-07 (×2): qty 1

## 2017-06-07 MED ORDER — ONDANSETRON HCL 4 MG PO TABS: 4.0000 mg | ORAL_TABLET | Freq: Four times a day (QID) | ORAL | Status: DC | PRN

## 2017-06-07 MED ORDER — MOMETASONE FURO-FORMOTEROL FUM 200-5 MCG/ACT IN AERO
2.0000 | INHALATION_SPRAY | Freq: Two times a day (BID) | RESPIRATORY_TRACT | Status: DC
  Administered 2017-06-07 – 2017-06-10 (×6): 2 via RESPIRATORY_TRACT
  Filled 2017-06-07: qty 8.8

## 2017-06-07 MED ORDER — ACETAMINOPHEN 650 MG RE SUPP: 650.0000 mg | Freq: Four times a day (QID) | RECTAL | Status: DC | PRN

## 2017-06-07 MED ORDER — SODIUM CHLORIDE 0.9 % IV BOLUS (SEPSIS)
500.0000 mL | Freq: Once | INTRAVENOUS | Status: AC
  Administered 2017-06-07: 500 mL via INTRAVENOUS

## 2017-06-07 MED ORDER — SODIUM CHLORIDE 0.9% FLUSH
3.0000 mL | Freq: Two times a day (BID) | INTRAVENOUS | Status: DC
  Administered 2017-06-07 – 2017-06-09 (×4): 3 mL via INTRAVENOUS

## 2017-06-07 MED ORDER — ALBUTEROL SULFATE (2.5 MG/3ML) 0.083% IN NEBU: 2.5000 mg | INHALATION_SOLUTION | Freq: Three times a day (TID) | RESPIRATORY_TRACT | Status: DC

## 2017-06-07 MED ORDER — INSULIN ASPART 100 UNIT/ML ~~LOC~~ SOLN: 0.0000 [IU] | Freq: Every day | SUBCUTANEOUS | Status: DC

## 2017-06-07 MED ORDER — DEXTROSE 50 % IV SOLN
1.0000 | Freq: Once | INTRAVENOUS | Status: AC
Start: 2017-06-07 — End: 2017-06-07
  Administered 2017-06-07: 50 mL via INTRAVENOUS
  Filled 2017-06-07: qty 50

## 2017-06-07 MED ORDER — ESCITALOPRAM OXALATE 20 MG PO TABS
20.0000 mg | ORAL_TABLET | Freq: Every day | ORAL | Status: DC
  Administered 2017-06-08 – 2017-06-10 (×3): 20 mg via ORAL
  Filled 2017-06-07 (×3): qty 1

## 2017-06-07 MED ORDER — ONDANSETRON HCL 4 MG/2ML IJ SOLN: 4.0000 mg | Freq: Four times a day (QID) | INTRAMUSCULAR | Status: DC | PRN

## 2017-06-07 MED ORDER — AMIODARONE HCL 200 MG PO TABS
400.0000 mg | ORAL_TABLET | Freq: Every day | ORAL | Status: DC
  Administered 2017-06-08 – 2017-06-10 (×3): 400 mg via ORAL
  Filled 2017-06-07 (×3): qty 2

## 2017-06-07 MED ORDER — SODIUM CHLORIDE 0.45 % IV SOLN
INTRAVENOUS | Status: DC
  Administered 2017-06-07: 22:00:00 via INTRAVENOUS

## 2017-06-07 MED ORDER — INSULIN ASPART 100 UNIT/ML ~~LOC~~ SOLN
10.0000 [IU] | Freq: Once | SUBCUTANEOUS | Status: AC
Start: 2017-06-07 — End: 2017-06-07
  Administered 2017-06-07: 10 [IU] via INTRAVENOUS
  Filled 2017-06-07: qty 1

## 2017-06-07 MED ORDER — LINACLOTIDE 72 MCG PO CAPS
72.0000 ug | ORAL_CAPSULE | Freq: Every day | ORAL | Status: DC
  Administered 2017-06-08: 72 ug via ORAL
  Filled 2017-06-07 (×3): qty 1

## 2017-06-07 MED ORDER — LEVOTHYROXINE SODIUM 50 MCG PO TABS
50.0000 ug | ORAL_TABLET | Freq: Every day | ORAL | Status: DC
  Administered 2017-06-08 – 2017-06-10 (×3): 50 ug via ORAL
  Filled 2017-06-07 (×3): qty 1

## 2017-06-07 MED ORDER — PREGABALIN 25 MG PO CAPS
25.0000 mg | ORAL_CAPSULE | Freq: Two times a day (BID) | ORAL | Status: DC
  Administered 2017-06-07 – 2017-06-10 (×6): 25 mg via ORAL
  Filled 2017-06-07 (×6): qty 1

## 2017-06-07 MED ORDER — HYDROCODONE-ACETAMINOPHEN 5-325 MG PO TABS
1.0000 | ORAL_TABLET | Freq: Four times a day (QID) | ORAL | Status: DC | PRN
  Administered 2017-06-08 (×2): 1 via ORAL
  Filled 2017-06-07 (×2): qty 1

## 2017-06-07 MED ORDER — ALBUTEROL SULFATE (2.5 MG/3ML) 0.083% IN NEBU
2.5000 mg | INHALATION_SOLUTION | RESPIRATORY_TRACT | Status: DC | PRN
Start: 2017-06-07 — End: 2017-06-10

## 2017-06-07 MED ORDER — PNEUMOCOCCAL VAC POLYVALENT 25 MCG/0.5ML IJ INJ: 0.5000 mL | INJECTION | INTRAMUSCULAR | Status: DC

## 2017-06-07 MED ORDER — METOPROLOL SUCCINATE ER 100 MG PO TB24
100.0000 mg | ORAL_TABLET | Freq: Every day | ORAL | Status: DC
  Administered 2017-06-08 – 2017-06-09 (×2): 100 mg via ORAL
  Filled 2017-06-07 (×2): qty 1

## 2017-06-07 MED ORDER — SODIUM BICARBONATE 8.4 % IV SOLN
50.0000 meq | Freq: Once | INTRAVENOUS | Status: AC
  Administered 2017-06-07: 50 meq via INTRAVENOUS
  Filled 2017-06-07: qty 50

## 2017-06-07 MED ORDER — CYCLOBENZAPRINE HCL 10 MG PO TABS: 5.0000 mg | ORAL_TABLET | Freq: Three times a day (TID) | ORAL | Status: DC | PRN

## 2017-06-07 MED ORDER — HEPARIN BOLUS VIA INFUSION
4500.0000 [IU] | Freq: Once | INTRAVENOUS | Status: AC
  Administered 2017-06-07: 4500 [IU] via INTRAVENOUS
  Filled 2017-06-07: qty 4500

## 2017-06-07 MED ORDER — POLYETHYLENE GLYCOL 3350 17 G PO PACK: 17.0000 g | PACK | Freq: Every day | ORAL | Status: DC | PRN

## 2017-06-07 MED ORDER — ALBUTEROL SULFATE (2.5 MG/3ML) 0.083% IN NEBU
2.5000 mg | INHALATION_SOLUTION | Freq: Three times a day (TID) | RESPIRATORY_TRACT | Status: DC
  Administered 2017-06-07 – 2017-06-09 (×5): 2.5 mg via RESPIRATORY_TRACT
  Filled 2017-06-07 (×5): qty 3

## 2017-06-07 MED ORDER — MIRTAZAPINE 15 MG PO TABS
15.0000 mg | ORAL_TABLET | Freq: Every day | ORAL | Status: DC
  Administered 2017-06-07 – 2017-06-09 (×3): 15 mg via ORAL
  Filled 2017-06-07 (×3): qty 1

## 2017-06-07 MED ORDER — TIOTROPIUM BROMIDE MONOHYDRATE 18 MCG IN CAPS
18.0000 ug | ORAL_CAPSULE | Freq: Every day | RESPIRATORY_TRACT | Status: DC
  Administered 2017-06-08 – 2017-06-10 (×3): 18 ug via RESPIRATORY_TRACT
  Filled 2017-06-07: qty 5

## 2017-06-07 MED ORDER — ACETAMINOPHEN 325 MG PO TABS: 650.0000 mg | ORAL_TABLET | Freq: Four times a day (QID) | ORAL | Status: DC | PRN

## 2017-06-07 MED ORDER — HEPARIN (PORCINE) IN NACL 100-0.45 UNIT/ML-% IJ SOLN
1300.0000 [IU]/h | INTRAMUSCULAR | Status: DC
  Administered 2017-06-07: 1300 [IU]/h via INTRAVENOUS
  Filled 2017-06-07 (×2): qty 250

## 2017-06-07 NOTE — ED Provider Notes (Addendum)
[facility] Emergency Department Provider Note  ____________________________________________  Time seen: Approximately 2:20 PM  I have reviewed the triage vital signs and the nursing notes.   HISTORY  Chief Complaint Dizziness   HPI Brian Hampton is a 61 y.o. male with h/o HIV, COPD, cirrhosis, DVT on lovenox, HTN, CAD, DM who presents for acute kidney injury. Patient had labs done last week for a elective hip replacement surgery that is supposed to happen this Thursday and today went back for f/u appointment and sent to the ED for elevated creatinine. Patient reports that he has had several episodes of nonbloody nonbilious emesis over the last few days. No diarrhea, no fever or chills, no abdominal pain, no chest pain. Has been feeling shortness of breath which is worse than baseline. Patient does have generalized tremors and feels very weak which is new for him. He endorses compliance with his medications. He denies any new medicines.  Past Medical History:  Diagnosis Date  . Anemia   . Asthma   . Cataracts, bilateral    worse in Rt eye  . Collagen vascular disease (HCC)   . COPD (chronic obstructive pulmonary disease) (HCC)   . Coronary artery disease   . Depression   . DVT (deep venous thrombosis) (HCC)   . Dysrhythmia   . Emphysema/COPD (HCC)   . Environmental and seasonal allergies   . Family history of adverse reaction to anesthesia    sister PONV  . GERD (gastroesophageal reflux disease)   . H/O blood clots   . Heart murmur   . HIV (human immunodeficiency virus infection) (HCC)   . Hypertension   . Leaky heart valve    x 3  . Lung mass   . Myocardial infarction (HCC)    in 2000  . Pneumonia    year ago  . Pulmonary emboli (HCC)   . Type 2 diabetes mellitus [facility])     Patient Active Problem List   Diagnosis Date Noted  . Avascular necrosis of bones of both hips (HCC) 05/04/2017  . 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 Falls 05/02/2017    . Acute deep vein thrombosis (DVT) of femoral vein of right lower extremity (HCC) 04/04/2017  . Left leg pain 03/22/2017  . DVT (deep venous thrombosis) (HCC) 03/12/2017  . Acute deep vein thrombosis (DVT) of right lower extremity (HCC) 02/25/2017  . Postoperative pain   . COPD exacerbation (HCC) 12/23/2016  . Abdominal pain   . Compression fracture of body of thoracic vertebra (HCC) 12/21/2016  . Hypomagnesemia 05/05/2016  . Rhabdomyolysis 05/05/2016  . Acute pulmonary embolism (HCC) 03/17/2016  . Right leg DVT (HCC) 03/17/2016  . Hypokalemia 03/17/2016  . S/P IVC filter 03/17/2016  . SOB (shortness of breath) 03/15/2016  . Dysphagia 02/02/2016  . GERD (gastroesophageal reflux disease) 02/02/2016  . Hyperthyroidism 01/31/2016  . 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 Elevated transaminase level 01/31/2016  . Anemia 01/31/2016  . Thrombocytopenia (HCC) 01/31/2016  . Pyuria 01/31/2016  . Weakness 01/29/2016  . HIV (human immunodeficiency virus infection) (HCC) 01/29/2016  . CAD (coronary artery disease) 01/29/2016  . COPD (chronic obstructive pulmonary disease) (HCC) 01/29/2016  . Diabetes mellitus (HCC) 01/29/2016  . Leg weakness, bilateral 01/13/2016  . Chest pain 06/24/2015    Past Surgical History:  Procedure Laterality Date  . ANKLE ARTHROSCOPY    . BACK SURGERY    . CARDIAC CATHETERIZATION    . IVC FILTER INSERTION    . KYPHOPLASTY N/A 12/21/2016  Procedure: KYPHOPLASTY;  Surgeon: Kennedy Bucker, MD;  Location: [facility] ORS;  Service: Orthopedics;  Laterality: N/A;  . PERIPHERAL VASCULAR CATHETERIZATION N/A 03/16/2016   Procedure: IVC Filter Insertion;  Surgeon: Renford Dills, MD;  Location: [facility] INVASIVE CV LAB;  Service: Cardiovascular;  Laterality: N/A;  . SINUS EXPLORATION    . TONSILLECTOMY    . WRIST ARTHROSCOPY Right     Prior to Admission medications   Medication Sig Start Date End Date Taking? Authorizing Provider  abacavir-dolutegravir-lamiVUDine  (TRIUMEQ) 600-50-300 MG tablet Take 1 tablet by mouth daily after breakfast. Reported on 01/30/2016 01/27/16   [provider]  acetaminophen (TYLENOL) 325 MG tablet Take 1 tablet (325 mg total) by mouth every 6 (six) hours as needed for mild pain (or Fever >/= 101). 03/15/17   Gouru, Aruna, MD  albuterol (PROVENTIL HFA;VENTOLIN HFA) 108 (90 BASE) MCG/ACT inhaler Inhale 2 puffs into the lungs every 6 (six) hours as needed for wheezing or shortness of breath.    [provider]  albuterol (PROVENTIL) (2.5 MG/3ML) 0.083% nebulizer solution Take 2.5 mg by nebulization 3 (three) times daily.     [provider]  amiodarone (PACERONE) 400 MG tablet Take 1 tablet (400 mg total) by mouth daily. 05/07/17   Shaune Pollack, MD  atorvastatin (LIPITOR) 10 MG tablet Take 10 mg by mouth daily after breakfast. 11/16/16   [provider]  Calcium Carbonate-Vitamin D (CALCIUM 600+D) 600-400 MG-UNIT tablet Take 1 tablet by mouth daily after breakfast.     [provider]  cetirizine (ZYRTEC) 10 MG tablet Take 10 mg by mouth daily as needed for allergies.     [provider]  cyclobenzaprine (FLEXERIL) 5 MG tablet Take 1 tablet (5 mg total) by mouth 3 (three) times daily as needed for muscle spasms. 05/06/17   Shaune Pollack, MD  docusate sodium (COLACE) 50 MG capsule Take 50 mg by mouth daily as needed for mild constipation.     [provider]  doxycycline (VIBRA-TABS) 100 MG tablet Take 1 tablet (100 mg total) by mouth every 12 (twelve) hours. 05/06/17   Shaune Pollack, MD  EPINEPHrine (EPIPEN 2-PAK) 0.3 mg/0.3 mL IJ SOAJ injection Inject 0.3 mg into the muscle once as needed. For anaphylaxis    [provider]  escitalopram (LEXAPRO) 20 MG tablet Take 20 mg by mouth daily after breakfast.     [provider]  fondaparinux (ARIXTRA) 7.5 MG/0.6ML SOLN injection Inject 0.6 mLs (7.5 mg total) into the skin daily. 04/04/17   Earna Coder, MD  furosemide  (LASIX) 20 MG tablet Take 20 mg by mouth daily.  10/22/16   [provider]  HYDROcodone-acetaminophen (NORCO) 5-325 MG tablet Take 1 tablet by mouth every 6 (six) hours as needed for moderate pain. 05/06/17   Shaune Pollack, MD  levothyroxine (SYNTHROID, LEVOTHROID) 50 MCG tablet Take 50 mcg by mouth daily before breakfast.    [provider]  linaclotide (LINZESS) 72 MCG capsule Take 72 mcg by mouth daily after breakfast.    [provider]  Magnesium Oxide 500 MG TABS Take 500 mg by mouth daily after breakfast.    [provider]  meloxicam (MOBIC) 7.5 MG tablet Take 7.5 mg by mouth daily as needed for pain.    [provider]  metoprolol succinate (TOPROL-XL) 50 MG 24 hr tablet Take 1 tablet (50 mg total) by mouth daily. Take with or immediately following a meal. 05/06/17   Shaune Pollack, MD  mirtazapine (REMERON) 15  MG tablet Take 15 mg by mouth at bedtime.     [provider]  mometasone-formoterol (DULERA) 200-5 MCG/ACT AERO Inhale 2 puffs into the lungs 2 (two) times daily.     [provider]  Multiple Vitamin (MULTIVITAMIN WITH MINERALS) TABS tablet Take 1 tablet by mouth daily.     [provider]  nitroGLYCERIN (NITROSTAT) 0.4 MG SL tablet Place 0.4 mg under the tongue every 5 (five) minutes as needed for chest pain.    [provider]  omeprazole (PRILOSEC) 40 MG capsule Take 40 mg by mouth daily after breakfast. 11/16/16   [provider]  polyethylene glycol (MIRALAX / GLYCOLAX) packet Take 17 g by mouth daily as needed for mild constipation. Patient not taking: Reported on 05/30/2017 05/06/17   Shaune Pollack, MD  Potassium Chloride CR (MICRO-K) 8 MEQ CPCR capsule CR Take 8 mEq by mouth daily after breakfast.     [provider]  pregabalin (LYRICA) 75 MG capsule Take 1 capsule (75 mg total) by mouth 2 (two) times daily. 05/06/17   Shaune Pollack, MD  sennosides-docusate sodium (SENOKOT-S) 8.6-50 MG tablet  Take 1 tablet by mouth at bedtime as needed for constipation.    [provider]  tiotropium (SPIRIVA) 18 MCG inhalation capsule Place 18 mcg into inhaler and inhale daily.    [provider]  traMADol (ULTRAM) 50 MG tablet Take 1 tablet (50 mg total) by mouth 2 (two) times daily. 05/06/17   Shaune Pollack, MD  zolpidem (AMBIEN) 5 MG tablet Take 1 tablet (5 mg total) by mouth at bedtime. 05/06/17   Shaune Pollack, MD    Allergies Aspirin; Bee venom; Penicillins; Prednisone; Sulfa antibiotics; Sulfasalazine; Theophyllines; and Theophylline  Family History  Problem Relation Age of Onset  . CAD Unknown   . Asthma Mother   . Cirrhosis Father   . Deep vein thrombosis Neg Hx   . Diabetes Neg Hx     Social History Social History  Substance Use Topics  . Smoking status: Former Smoker    Quit date: 12/16/2001  . Smokeless tobacco: Never Used  . Alcohol use No    Review of Systems  Constitutional: Negative for fever. + tremors, generalized weakness Eyes: Negative for visual changes. ENT: Negative for sore throat. Neck: No neck pain  Cardiovascular: Negative for chest pain. Respiratory: + shortness of breath. Gastrointestinal: Negative for abdominal pain, diarrhea. + N/V Genitourinary: Negative for dysuria. Musculoskeletal: Negative for back pain. Skin: Negative for rash. Neurological: Negative for headaches, weakness or numbness. Psych: No SI or HI  ____________________________________________   PHYSICAL EXAM:  VITAL SIGNS: ED Triage Vitals  Enc Vitals Group     BP 06/07/17 1233 136/62     Pulse Rate 06/07/17 1233 88     Resp 06/07/17 1233 (!) 22     Temp 06/07/17 1233 97.7 F (36.5 C)     Temp Source 06/07/17 1233 Oral     SpO2 06/07/17 1233 100 %     Weight 06/07/17 1233 180 lb (81.6 kg)     Height 06/07/17 1233 5\' 5"  (1.651 m)     Head Circumference --      Peak Flow --      Pain Score 06/07/17 1232 10     Pain Loc --      Pain Edu? --      Excl. in GC?  --     Constitutional: Alert and oriented, chronically ill appearing. HEENT:      Head:  Normocephalic and atraumatic.         Eyes: Conjunctivae are normal. Sclera is non-icteric.       Mouth/Throat: Mucous membranes are moist.       Neck: Supple with no signs of meningismus. Cardiovascular: Regular rate and rhythm. No murmurs, gallops, or rubs. 2+ symmetrical distal pulses are present in all extremities. No JVD. Respiratory: Normal respiratory effort. Lungs are clear to auscultation bilaterally. No wheezes, crackles, or rhonchi.  Gastrointestinal: Soft, non tender, and non distended with positive bowel sounds. No rebound or guarding. Musculoskeletal: 3+ pitting edema to b/l thighs Neurologic: Normal speech and language. Face is symmetric. Moving all extremities. No gross focal neurologic deficits are appreciated. Skin: Skin is warm, dry and intact. No rash noted. Psychiatric: Mood and affect are normal. Speech and behavior are normal.  ____________________________________________   LABS (all labs ordered are listed, but only abnormal results are displayed)  Labs Reviewed  COMPREHENSIVE METABOLIC PANEL - Abnormal; Notable for the following:       Result Value   Potassium 5.4 (*)    CO2 19 (*)    BUN 64 (*)    Creatinine, Ser 5.27 (*)    Calcium 8.4 (*)    Albumin 2.3 (*)    Alkaline Phosphatase 239 (*)    GFR calc non Af Amer 11 (*)    GFR calc Af Amer 12 (*)    All other components within normal limits  CBC WITH DIFFERENTIAL/PLATELET - Abnormal; Notable for the following:    RBC 3.17 (*)    Hemoglobin 10.7 (*)    HCT 33.0 (*)    MCV 104.3 (*)    RDW 17.9 (*)    All other components within normal limits  AMMONIA  TROPONIN I  URINALYSIS, COMPLETE (UACMP) WITH MICROSCOPIC  SODIUM, URINE, RANDOM   ____________________________________________  EKG  ED ECG REPORT I, Nita Sickle, the attending physician, personally viewed and interpreted this ECG.  Normal sinus  rhythm, rate of 80, normal intervals, normal axis, low voltage QRS, no ST elevations or depressions, flattening T waves diffusely. No significant changes when compared to prior.  ____________________________________________  RADIOLOGY  CXR:  Unchanged from baseline ____________________________________________   PROCEDURES  Procedure(s) performed: None Procedures Critical Care performed: yes  CRITICAL CARE Performed by: Nita Sickle  ?  Total critical care time: 35 min  Critical care time was exclusive of separately billable procedures and treating other patients.  Critical care was necessary to treat or prevent imminent or life-threatening deterioration.  Critical care was time spent personally by me on the following activities: development of treatment plan with patient and/or surrogate as well as nursing, discussions with consultants, evaluation of patient's response to treatment, examination of patient, obtaining history from patient or surrogate, ordering and performing treatments and interventions, ordering and review of laboratory studies, ordering and review of radiographic studies, pulse oximetry and re-evaluation of patient's condition.  ____________________________________________   INITIAL IMPRESSION / ASSESSMENT AND PLAN / ED COURSE   61 y.o. male with h/o HIV, COPD, cirrhosis, DVT on lovenox, HTN, CAD, DM who presents for acute kidney injury which was detected on preop blood work done for a elective hip replacement surgery. Patient looks volume overloaded with 3+ pitting edema bilateral lower extremities that goes all the way to his thighs. Lungs are clear with no new oxygen requirement. EKG showing no evidence of hyperkalemia. Will repeat labs including CBC, CMP, troponin, ammonia. We'll do a chest x-ray since patient is complaining of shortness of  breath to evaluate for pulmonary edema.Anticipate admission.    _________________________ 3:11 PM on  06/07/2017 -----------------------------------------  Labs showing even worse creatinine. Patient's baseline creatinine is 1.2 as of a month ago. A week ago creatinine was 3.9 and today is 5.27 with an elevated BUN of 64. The case elevated at 5.4 with no EKG changes. We'll treat with bicarbonate, D50, and insulin. We'll also give gentle hydration and to her sure the patient is able to urinate. Discussed with the hospitalist for admission for acute kidney injury and hyperkalemia.   As part of my medical decision making, I reviewed the following data within the electronic MEDICAL RECORD NUMBER History obtained from family, Nursing notes reviewed and incorporated, Labs reviewed , EKG interpreted , Old EKG reviewed, Old chart reviewed, Radiograph reviewed , Discussed with admitting physician , Notes from prior ED visits and [place] Controlled Substance Database    Pertinent labs & imaging results that were available during my care of the patient were reviewed by me and considered in my medical decision making (see chart for details).    ____________________________________________   FINAL CLINICAL IMPRESSION(S) / ED DIAGNOSES  Final diagnoses:  Acute renal failure superimposed on chronic kidney disease, unspecified CKD stage, unspecified acute renal failure type (HCC)  Hyperkalemia  Uremia, acute      NEW MEDICATIONS STARTED DURING THIS VISIT:  New Prescriptions   No medications on file     Note:  This document was prepared using Dragon voice recognition software and may include unintentional dictation errors.    Nita SickleVeronese, Girard, MD 06/07/17 1514    Nita SickleVeronese, Winston, MD 06/07/17 [phone]

## 2017-06-07 NOTE — ED Triage Notes (Signed)
[facility] Pt reports he was told that his "kidneys are shutting down". Pt reports SOB, abdominal pain and chest pain. Pt alert and oriented X4, active, cooperative, pt in NAD. RR even and unlabored, color WNL.

## 2017-06-07 NOTE — ED Notes (Signed)
Patient to Room 5.  Vikki PortsValerie RN aware.

## 2017-06-07 NOTE — Progress Notes (Addendum)
ANTICOAGULATION CONSULT NOTE - Initial Consult  Pharmacy Consult for heparin  Indication: VTE treatment  Allergies  Allergen Reactions  . Aspirin Anaphylaxis  . Bee Venom Anaphylaxis  . Penicillins Anaphylaxis and Other (See Comments)    Has patient had a PCN reaction causing immediate rash, facial/tongue/throat swelling, SOB or lightheadedness with hypotension: Yes Has patient had a PCN reaction causing severe rash involving mucus membranes or skin necrosis: No Has patient had a PCN reaction that required hospitalization No Has patient had a PCN reaction occurring within the last 10 years: Yes If all of the above answers are "NO", then may proceed with Cephalosporin use.  . Prednisone Anaphylaxis  . Sulfa Antibiotics Anaphylaxis  . Sulfasalazine Anaphylaxis  . Theophyllines Anaphylaxis  . Theophylline Swelling    Patient Measurements: Height: 5\' 5"  (165.1 cm) Weight: 180 lb (81.6 kg) IBW/kg (Calculated) : 61.5 Heparin Dosing Weight:   Vital Signs: Temp: 97.7 F (36.5 C) (10/30 1233) Temp Source: Oral (10/30 1233) BP: 136/62 (10/30 1233) Pulse Rate: 88 (10/30 1233)  Labs:  Recent Labs  06/07/17 1234 06/07/17 1416 06/07/17 1423  HGB  --  10.7*  --   HCT  --  33.0*  --   PLT  --  197  --   APTT  --   --  35  LABPROT  --   --  17.3*  INR  --   --  1.43  CREATININE  --  5.27*  --   CKTOTAL  --  113  --   TROPONINI <0.03  --   --     Estimated Creatinine Clearance: 14.7 mL/min (A) (by C-G formula based on SCr of 5.27 mg/dL (H)).   Medical History: Past Medical History:  Diagnosis Date  . Anemia   . Asthma   . Cataracts, bilateral    worse in Rt eye  . Collagen vascular disease (HCC)   . COPD (chronic obstructive pulmonary disease) (HCC)   . Coronary artery disease   . Depression   . DVT (deep venous thrombosis) (HCC)   . Dysrhythmia   . Emphysema/COPD (HCC)   . Environmental and seasonal allergies   . Family history of adverse reaction to anesthesia     sister PONV  . GERD (gastroesophageal reflux disease)   . H/O blood clots   . Heart murmur   . HIV (human immunodeficiency virus infection) (HCC)   . Hypertension   . Leaky heart valve    x 3  . Lung mass   . Myocardial infarction (HCC)    in 2000  . Pneumonia    year ago  . Pulmonary emboli (HCC)   . Type 2 diabetes mellitus (HCC)     Medications:  Infusions:  . sodium chloride    . heparin    . sodium chloride      Assessment: 61 yom cc dizziness with PMH DM, COPD, DVT and PE with IVC on Arixtra, CKD, avascular necrosis  Goal of Therapy:  Heparin level 0.3-0.7 units/ml Monitor platelets by anticoagulation protocol: Yes   Plan:  Give 4500 units bolus x 1 Start heparin infusion at 1300 units/hr Check anti-Xa level in 8 hours and daily while on heparin Continue to monitor H&H and platelets  [name], Pharm.D., BCPS Clinical Pharmacist 06/07/2017,5:23 PM    10/31 0030 heparin level 3.12. Hold drip x 1 hour and restart at 1000 units/hr. Recheck heparin level 8 hours after restart. Also checking aPTT to check for correlation as aPTT was  WNL at baseline.  Fulton ReekMatt Khambrel Amsden, PharmD, BCPS  06/08/17 2:18 AM

## 2017-06-07 NOTE — Telephone Encounter (Signed)
Dr. Donneta RombergBrahmanday made aware of msg

## 2017-06-07 NOTE — ED Notes (Signed)
Pt stuck three times by RN and tech with no luck on blood draw. Pt is very hard stick.

## 2017-06-07 NOTE — ED Notes (Signed)
Patient transported to Ultrasound 

## 2017-06-07 NOTE — ED Notes (Signed)
Wait explained to patient.  Patient states he believes he will be admitted.

## 2017-06-07 NOTE — ED Notes (Signed)
IV team at bedside to start second IV

## 2017-06-07 NOTE — H&P (Signed)
[employer] - [place] [facility]   PATIENT NAME: Brian [place]    MR#:  [id number]  DATE OF BIRTH:  11-23-1955  DATE OF ADMISSION:  06/07/2017  PRIMARY CARE PHYSICIAN: Lyndon Code, MD   REQUESTING/REFERRING PHYSICIAN: Dr. Don Perking  CHIEF COMPLAINT:   Chief Complaint  Patient presents with  . Dizziness    HISTORY OF PRESENT ILLNESS:  Brian [place]  is a 61 y.o. 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  Baseline creatinine is 1.2 and today is 5.5 with potassium of 5.7.  The patient has noticed some blood in his urine.  Feels like he is peeing more than usual.  Has worsening lower extremity edema bilaterally.  Takes meloxicam for pain occasionally.  No recent change in medications.  PAST MEDICAL HISTORY:   Past Medical History:  Diagnosis Date  . Anemia   . Asthma   . Cataracts, bilateral    worse in Rt eye  . Collagen vascular disease (HCC)   . COPD (chronic obstructive pulmonary disease) (HCC)   . Coronary artery disease   . Depression   . DVT (deep venous thrombosis) (HCC)   . Dysrhythmia   . Emphysema/COPD (HCC)   . Environmental and seasonal allergies   . Family history of adverse reaction to anesthesia    sister PONV  . GERD (gastroesophageal reflux disease)   . H/O blood clots   . Heart murmur   . HIV (human immunodeficiency virus infection) (HCC)   . Hypertension   . Leaky heart valve    x 3  . Lung mass   . Myocardial infarction (HCC)    in 2000  . Pneumonia    year ago  . Pulmonary emboli (HCC)   . Type 2 diabetes mellitus (HCC)     PAST SURGICAL HISTORY:   Past Surgical History:  Procedure Laterality Date  . ANKLE ARTHROSCOPY    . BACK SURGERY    . CARDIAC CATHETERIZATION    . IVC FILTER INSERTION    . KYPHOPLASTY N/A 12/21/2016    Procedure: KYPHOPLASTY;  Surgeon: Kennedy Bucker, MD;  Location: [facility] ORS;  Service: Orthopedics;  Laterality: N/A;  . PERIPHERAL VASCULAR CATHETERIZATION N/A 03/16/2016   Procedure: IVC Filter Insertion;  Surgeon: Renford Dills, MD;  Location: [facility] INVASIVE CV LAB;  Service: Cardiovascular;  Laterality: N/A;  . SINUS EXPLORATION    . TONSILLECTOMY    . WRIST ARTHROSCOPY Right     SOCIAL HISTORY:   Social History  Substance Use Topics  . Smoking status: Former Smoker    Quit date: 12/16/2001  . Smokeless tobacco: Never Used  . Alcohol use No    FAMILY HISTORY:   Family History  Problem Relation Age of Onset  . CAD Unknown   . Asthma Mother   . Cirrhosis Father   . Deep vein thrombosis Neg Hx   . Diabetes Neg Hx     DRUG ALLERGIES:   Allergies  Allergen Reactions  . Aspirin Anaphylaxis  . Bee Venom Anaphylaxis  . Penicillins Anaphylaxis and Other (See Comments)    Has patient had a PCN reaction causing immediate rash, facial/tongue/throat swelling, SOB or lightheadedness with hypotension: Yes Has patient had a PCN reaction causing severe rash involving mucus membranes or skin necrosis: No  Has patient had a PCN reaction that required hospitalization No Has patient had a PCN reaction occurring within the last 10 years: Yes If all of the above answers are "NO", then may proceed with Cephalosporin use.  . Prednisone Anaphylaxis  . Sulfa Antibiotics Anaphylaxis  . Sulfasalazine Anaphylaxis  . Theophyllines Anaphylaxis  . Theophylline Swelling    REVIEW OF SYSTEMS:   Review of Systems  Constitutional: Positive for malaise/fatigue. Negative for chills and fever.  HENT: Negative for sore throat.   Eyes: Negative for blurred vision, double vision and pain.  Respiratory: Negative for cough, hemoptysis, shortness of breath and wheezing.   Cardiovascular: Negative for chest pain, palpitations, orthopnea and leg swelling.  Gastrointestinal: Negative for abdominal pain,  constipation, diarrhea, heartburn, nausea and vomiting.  Genitourinary: Negative for dysuria and hematuria.  Musculoskeletal: Positive for back pain, falls and joint pain.  Skin: Negative for rash.  Neurological: Positive for weakness. Negative for sensory change, speech change, focal weakness and headaches.  Endo/Heme/Allergies: Does not bruise/bleed easily.  Psychiatric/Behavioral: Negative for depression. The patient is not nervous/anxious.     MEDICATIONS AT HOME:   Prior to Admission medications   Medication Sig Start Date End Date Taking? Authorizing Provider  abacavir-dolutegravir-lamiVUDine (TRIUMEQ) 600-50-300 MG tablet Take 1 tablet by mouth daily after breakfast. Reported on 01/30/2016 01/27/16  Yes [provider]  acetaminophen (TYLENOL) 325 MG tablet Take 1 tablet (325 mg total) by mouth every 6 (six) hours as needed for mild pain (or Fever >/= 101). 03/15/17  Yes Gouru, Aruna, MD  albuterol (PROVENTIL HFA;VENTOLIN HFA) 108 (90 BASE) MCG/ACT inhaler Inhale 2 puffs into the lungs every 6 (six) hours as needed for wheezing or shortness of breath.   Yes [provider]  albuterol (PROVENTIL) (2.5 MG/3ML) 0.083% nebulizer solution Take 2.5 mg by nebulization 3 (three) times daily.    Yes [provider]  amiodarone (PACERONE) 400 MG tablet Take 1 tablet (400 mg total) by mouth daily. 05/07/17  Yes Shaune Pollackhen, Qing, MD  atorvastatin (LIPITOR) 10 MG tablet Take 10 mg by mouth daily after breakfast. 11/16/16  Yes [provider]  Calcium Carbonate-Vitamin D (CALCIUM 600+D) 600-400 MG-UNIT tablet Take 1 tablet by mouth daily after breakfast.    Yes [provider]  cetirizine (ZYRTEC) 10 MG tablet Take 10 mg by mouth daily as needed for allergies.    Yes [provider]  cyclobenzaprine (FLEXERIL) 5 MG tablet Take 1 tablet (5 mg total) by mouth 3 (three) times daily as needed for muscle spasms. Patient taking differently: Take 5 mg by mouth 2  (two) times daily.  05/06/17  Yes Shaune Pollackhen, Qing, MD  docusate sodium (COLACE) 50 MG capsule Take 50 mg by mouth daily as needed for mild constipation.    Yes [provider]  EPINEPHrine (EPIPEN 2-PAK) 0.3 mg/0.3 mL IJ SOAJ injection Inject 0.3 mg into the muscle once as needed. For anaphylaxis   Yes [provider]  escitalopram (LEXAPRO) 20 MG tablet Take 20 mg by mouth daily after breakfast.    Yes [provider]  fondaparinux (ARIXTRA) 7.5 MG/0.6ML SOLN injection Inject 0.6 mLs (7.5 mg total) into the skin daily. 04/04/17  Yes Earna CoderBrahmanday, Govinda R, MD  furosemide (LASIX) 20 MG tablet Take 20 mg by mouth daily.  10/22/16  Yes [provider]  HYDROcodone-acetaminophen (NORCO) 5-325 MG tablet Take 1 tablet by mouth every 6 (six) hours as needed for moderate pain. 05/06/17  Yes Shaune Pollackhen, Qing, MD  linaclotide [facility](LINZESS) [phone]  MCG capsule Take 72 mcg by mouth daily after breakfast.   Yes [provider]  losartan (COZAAR) 25 MG tablet Take 25 mg by mouth daily.   Yes [provider]  Magnesium Oxide 500 MG TABS Take 500 mg by mouth daily after breakfast.   Yes [provider]  meloxicam (MOBIC) 7.5 MG tablet Take 7.5 mg by mouth daily as needed for pain.   Yes [provider]  metoprolol succinate (TOPROL-XL) 100 MG 24 hr tablet Take 100 mg by mouth daily. 05/02/17  Yes [provider]  mirtazapine (REMERON) 15 MG tablet Take 15 mg by mouth at bedtime.    Yes [provider]  mometasone-formoterol (DULERA) 200-5 MCG/ACT AERO Inhale 2 puffs into the lungs 2 (two) times daily.    Yes [provider]  Multiple Vitamin (MULTIVITAMIN WITH MINERALS) TABS tablet Take 1 tablet by mouth daily.    Yes [provider]  nitroGLYCERIN (NITROSTAT) 0.4 MG SL tablet Place 0.4 mg under the tongue every 5 (five) minutes as needed for chest pain.   Yes [provider]  pregabalin (LYRICA) 75 MG capsule Take 1 capsule  (75 mg total) by mouth 2 (two) times daily. 05/06/17  Yes Shaune Pollack, MD  sennosides-docusate sodium (SENOKOT-S) 8.6-50 MG tablet Take 1 tablet by mouth at bedtime as needed for constipation.   Yes [provider]  tiotropium (SPIRIVA) 18 MCG inhalation capsule Place 18 mcg into inhaler and inhale daily.   Yes [provider]  doxycycline (VIBRA-TABS) 100 MG tablet Take 1 tablet (100 mg total) by mouth every 12 (twelve) hours. Patient not taking: Reported on 06/07/2017 05/06/17   Shaune Pollack, MD  levothyroxine (SYNTHROID, LEVOTHROID) 50 MCG tablet Take 50 mcg by mouth daily before breakfast.    [provider]  metoprolol succinate (TOPROL-XL) 50 MG 24 hr tablet Take 1 tablet (50 mg total) by mouth daily. Take with or immediately following a meal. Patient not taking: Reported on 06/07/2017 05/06/17   Shaune Pollack, MD  omeprazole (PRILOSEC) 40 MG capsule Take 40 mg by mouth daily after breakfast. 11/16/16   [provider]  polyethylene glycol (MIRALAX / GLYCOLAX) packet Take 17 g by mouth daily as needed for mild constipation. Patient not taking: Reported on 05/30/2017 05/06/17   Shaune Pollack, MD  Potassium Chloride CR (MICRO-K) 8 MEQ CPCR capsule CR Take 8 mEq by mouth daily after breakfast.     [provider]  traMADol (ULTRAM) 50 MG tablet Take 1 tablet (50 mg total) by mouth 2 (two) times daily. Patient not taking: Reported on 06/07/2017 05/06/17   Shaune Pollack, MD  zolpidem (AMBIEN) 5 MG tablet Take 1 tablet (5 mg total) by mouth at bedtime. Patient not taking: Reported on 06/07/2017 05/06/17   Shaune Pollack, MD     VITAL SIGNS:  Blood pressure 136/62, pulse 88, temperature 97.7 F (36.5 C), temperature source Oral, resp. rate (!) 22, height 5\' 5"  (1.651 m), weight 81.6 kg (180 lb), SpO2 100 %.  PHYSICAL EXAMINATION:  Physical Exam  GENERAL:  61 y.o.-year-old patient lying in the bed with no acute distress.  EYES: Pupils equal, round, reactive to light and  accommodation. No scleral icterus. Extraocular muscles intact.  HEENT: Head atraumatic, normocephalic. Oropharynx and nasopharynx clear. No oropharyngeal erythema, moist oral mucosa  NECK:  Supple, no jugular venous distention. No thyroid enlargement, no tenderness.  LUNGS: Normal breath sounds bilaterally, no wheezing, rales, rhonchi. No use of accessory muscles of respiration.  CARDIOVASCULAR: S1, S2 normal. No murmurs, rubs, or gallops.  ABDOMEN: Soft, nontender, nondistended. Bowel sounds present. No organomegaly or mass.  EXTREMITIES: Bilateral lower extremity edema extending up to the thighs NEUROLOGIC: Cranial nerves II through XII are intact. No focal Motor or sensory deficits appreciated b/l. Limited assessment of right lower extremity due to hip pain PSYCHIATRIC: The patient is alert and awake. SKIN: No obvious rash, lesion, or ulcer.   LABORATORY PANEL:   CBC  Recent Labs Lab 06/07/17 1416  WBC 6.4  HGB 10.7*  HCT 33.0*  PLT 197   ------------------------------------------------------------------------------------------------------------------  Chemistries   Recent Labs Lab 06/07/17 1416  NA 142  K 5.4*  CL 111  CO2 19*  GLUCOSE 99  BUN 64*  CREATININE 5.27*  CALCIUM 8.4*  AST 41  ALT 20  ALKPHOS 239*  BILITOT 0.9   ------------------------------------------------------------------------------------------------------------------  Cardiac Enzymes  Recent Labs Lab 06/07/17 1234  TROPONINI <0.03   ------------------------------------------------------------------------------------------------------------------  RADIOLOGY:  Dg Chest 2 View  Result Date: 06/07/2017 CLINICAL DATA:  Shortness of breath, abdominal pain, and chest pain. EXAM: CHEST  2 VIEW COMPARISON:  05/02/2017. FINDINGS: Low lung volumes. Stable cardiomediastinal silhouette. Prior vertebral augmentation midthoracic region. No consolidation or edema. No effusion or pneumothorax. Healing  RIGHT-sided rib fractures. IMPRESSION: Healing rib fractures.  No active disease. Electronically Signed   By: Elsie Stain M.D.   On: 06/07/2017 15:18     IMPRESSION AND PLAN:   * AKI over CKD3 Etiology unclear.  He does have significant lower extremity edema but no signs of pulmonary edema.  Will check renal ultrasound.  Start IV fluids.  Consult nephrology.  Stop meloxicam.  Associated mild hyperkalemia.  Stop potassium supplements. Avoid nephrotoxic medications. Check CK level  *Bilateral lower extremity edema.  He does have history of DVT with IVC filter in place.  On Arixtra at home.  Check lower extremity Dopplers.  Will change Arixtra to heparin drip.  Consult oncology for further recommendations regarding long-term anticoagulation with his kidney issues.  *Diabetes mellitus.  Start sliding scale insulin.  Diabetic diet  * HIV Continue home meds   All the records are reviewed and case discussed with ED provider. Management plans discussed with the patient, family and they are in agreement.  CODE STATUS: DNR  TOTAL TIME TAKING CARE OF THIS PATIENT: 40 minutes.   Milagros Loll R M.D on 06/07/2017 at 3:46 PM  Between 7am to 6pm - Pager - [phone]  After 6pm go to [email or web address] - password [email or web address] [facility]  [facility]  Office  [phone]  CC: Primary care physician; Lyndon Code, MD  Note: This dictation was prepared with Dragon dictation along with smaller phrase technology. Any transcriptional errors that result from this process are unintentional.

## 2017-06-07 NOTE — Telephone Encounter (Signed)
[facility] Patient is scheduled for hip surgery Thursday and is on Arixtra, Asking about how to manage for surgery. I looked patient up and see that he is in the ER for acute kidney failure, sent by Dr Welton FlakesKhan PCP and per [facility] ortho note his surgery is to be cancelled due to kidney results. He was a No Show for appointment at [facility] yesterday

## 2017-06-08 DIAGNOSIS — R944 Abnormal results of kidney function studies: Secondary | ICD-10-CM

## 2017-06-08 DIAGNOSIS — Z8701 Personal history of pneumonia (recurrent): Secondary | ICD-10-CM

## 2017-06-08 DIAGNOSIS — I998 Other disorder of circulatory system: Secondary | ICD-10-CM

## 2017-06-08 DIAGNOSIS — J449 Chronic obstructive pulmonary disease, unspecified: Secondary | ICD-10-CM

## 2017-06-08 DIAGNOSIS — Z87891 Personal history of nicotine dependence: Secondary | ICD-10-CM

## 2017-06-08 DIAGNOSIS — K219 Gastro-esophageal reflux disease without esophagitis: Secondary | ICD-10-CM

## 2017-06-08 DIAGNOSIS — I10 Essential (primary) hypertension: Secondary | ICD-10-CM

## 2017-06-08 DIAGNOSIS — N179 Acute kidney failure, unspecified: Secondary | ICD-10-CM

## 2017-06-08 DIAGNOSIS — R300 Dysuria: Secondary | ICD-10-CM

## 2017-06-08 DIAGNOSIS — I2510 Atherosclerotic heart disease of native coronary artery without angina pectoris: Secondary | ICD-10-CM

## 2017-06-08 DIAGNOSIS — F329 Major depressive disorder, single episode, unspecified: Secondary | ICD-10-CM

## 2017-06-08 DIAGNOSIS — R011 Cardiac murmur, unspecified: Secondary | ICD-10-CM

## 2017-06-08 DIAGNOSIS — Z79899 Other long term (current) drug therapy: Secondary | ICD-10-CM

## 2017-06-08 DIAGNOSIS — Z86711 Personal history of pulmonary embolism: Secondary | ICD-10-CM

## 2017-06-08 DIAGNOSIS — Z794 Long term (current) use of insulin: Secondary | ICD-10-CM

## 2017-06-08 DIAGNOSIS — J439 Emphysema, unspecified: Secondary | ICD-10-CM

## 2017-06-08 DIAGNOSIS — J45909 Unspecified asthma, uncomplicated: Secondary | ICD-10-CM

## 2017-06-08 DIAGNOSIS — E119 Type 2 diabetes mellitus without complications: Secondary | ICD-10-CM

## 2017-06-08 DIAGNOSIS — D649 Anemia, unspecified: Secondary | ICD-10-CM

## 2017-06-08 DIAGNOSIS — I252 Old myocardial infarction: Secondary | ICD-10-CM

## 2017-06-08 DIAGNOSIS — B20 Human immunodeficiency virus [HIV] disease: Secondary | ICD-10-CM

## 2017-06-08 DIAGNOSIS — I82401 Acute embolism and thrombosis of unspecified deep veins of right lower extremity: Secondary | ICD-10-CM

## 2017-06-08 DIAGNOSIS — R531 Weakness: Secondary | ICD-10-CM

## 2017-06-08 LAB — BASIC METABOLIC PANEL
ANION GAP: 6 (ref 5–15)
BUN: 57 mg/dL — AB (ref 6–20)
CALCIUM: 8 mg/dL — AB (ref 8.9–10.3)
CO2: 25 mmol/L (ref 22–32)
Chloride: 114 mmol/L — ABNORMAL HIGH (ref 101–111)
Creatinine, Ser: 4.31 mg/dL — ABNORMAL HIGH (ref 0.61–1.24)
GFR calc Af Amer: 16 mL/min — ABNORMAL LOW (ref >60)
GFR, EST NON AFRICAN AMERICAN: 14 mL/min — AB (ref >60)
Glucose, Bld: 108 mg/dL — ABNORMAL HIGH (ref 65–99)
POTASSIUM: 3.8 mmol/L (ref 3.5–5.1)
SODIUM: 145 mmol/L (ref 135–145)

## 2017-06-08 LAB — CBC
HEMATOCRIT: 26.9 % — AB (ref 40.0–52.0)
Hemoglobin: 8.6 g/dL — ABNORMAL LOW (ref 13.0–18.0)
MCH: 33 pg (ref 26.0–34.0)
MCHC: 32.1 g/dL (ref 32.0–36.0)
MCV: 102.8 fL — ABNORMAL HIGH (ref 80.0–100.0)
PLATELETS: 152 10*3/uL (ref 150–440)
RBC: 2.61 MIL/uL — ABNORMAL LOW (ref 4.40–5.90)
RDW: 17.3 % — AB (ref 11.5–14.5)
WBC: 4.8 10*3/uL (ref 3.8–10.6)

## 2017-06-08 LAB — GLUCOSE, CAPILLARY
GLUCOSE-CAPILLARY: 92 mg/dL (ref 65–99)
GLUCOSE-CAPILLARY: 106 mg/dL — AB (ref 65–99)
GLUCOSE-CAPILLARY: 88 mg/dL (ref 65–99)
Glucose-Capillary: 84 mg/dL (ref 65–99)
Glucose-Capillary: 66 mg/dL (ref 65–99)
Glucose-Capillary: 103 mg/dL — ABNORMAL HIGH (ref 65–99)
Glucose-Capillary: 84 mg/dL (ref 65–99)

## 2017-06-08 LAB — APTT
aPTT: >160 seconds (ref 24–36)
aPTT: 114 seconds — ABNORMAL HIGH (ref 24–36)

## 2017-06-08 LAB — HEPARIN LEVEL (UNFRACTIONATED)
Heparin Unfractionated: 3.12 IU/mL — ABNORMAL HIGH (ref 0.30–0.70)
Heparin Unfractionated: 1.86 IU/mL — ABNORMAL HIGH (ref 0.30–0.70)
Heparin Unfractionated: 1.35 IU/mL — ABNORMAL HIGH (ref 0.30–0.70)

## 2017-06-08 MED ORDER — HEPARIN (PORCINE) IN NACL 100-0.45 UNIT/ML-% IJ SOLN
1000.0000 [IU]/h | INTRAMUSCULAR | Status: DC
  Administered 2017-06-08 (×2): 1000 [IU]/h via INTRAVENOUS
  Filled 2017-06-08: qty 250

## 2017-06-08 MED ORDER — SODIUM CHLORIDE 0.9 % IV SOLN
INTRAVENOUS | Status: DC
  Administered 2017-06-08 – 2017-06-09 (×2): via INTRAVENOUS

## 2017-06-08 MED ORDER — LOPERAMIDE HCL 2 MG PO CAPS
2.0000 mg | ORAL_CAPSULE | Freq: Four times a day (QID) | ORAL | Status: DC | PRN
Start: 2017-06-08 — End: 2017-06-10
  Administered 2017-06-08 – 2017-06-10 (×4): 2 mg via ORAL
  Filled 2017-06-08 (×4): qty 1

## 2017-06-08 MED ORDER — HEPARIN (PORCINE) IN NACL 100-0.45 UNIT/ML-% IJ SOLN
750.0000 [IU]/h | INTRAMUSCULAR | Status: DC
  Administered 2017-06-08: 700 [IU]/h via INTRAVENOUS
  Administered 2017-06-09: 650 [IU]/h via INTRAVENOUS
  Filled 2017-06-08: qty 250

## 2017-06-08 NOTE — Progress Notes (Signed)
Renal US showed post void bladder distention with 900 ml urine and fullness of renal collecting systems.  AKI due to obstruction Foley to be placed

## 2017-06-08 NOTE — Consult Note (Signed)
[facility] CONSULT NOTE  Patient Care Team: Lyndon Code, MD as PCP - General (Internal Medicine)  CHIEF COMPLAINTS/PURPOSE OF CONSULTATION:  Recurrent DVT  HISTORY OF PRESENTING ILLNESS:  Brian Hampton 61 y.o.  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 Patient noted to have a significant increase in the creatinine at 5.27 [baseline 1.2]. He also had ultrasound of the lower extremity that showed- acute DVT of his right lower extremity; although nonocclusive. Hematology has been consulted for further evaluation/and recommendation.  Patient had a Foley catheter placed- and draining dark yellow urine no blood. Patient denies any blood in stools. Patient complains of chronic pain/swelling right lower - however this is not significantly worse. He does complain of easy bruising. He admits to taking his Arixtra on a regular basis.  ROS: A complete 10 point review of system is done which is negative except mentioned above in history of present illness  MEDICAL HISTORY:  Past Medical History:  Diagnosis Date  . Anemia   . Asthma   . Cataracts, bilateral    worse in Rt eye  . Collagen vascular disease (HCC)   . COPD (chronic obstructive pulmonary disease) (HCC)   . Coronary artery disease   . Depression   . DVT (deep venous thrombosis) (HCC)   . Dysrhythmia   . Emphysema/COPD (HCC)   . Environmental and seasonal allergies   . Family history of adverse reaction to anesthesia    sister PONV  . GERD (gastroesophageal reflux disease)   . H/O blood clots   . Heart murmur   . HIV (human immunodeficiency virus infection) (HCC)   . Hypertension   . Leaky heart valve    x 3  . Lung mass   . Myocardial infarction (HCC)    in 2000  . Pneumonia    year ago  . Pulmonary emboli (HCC)   . Type 2 diabetes mellitus (HCC)     SURGICAL  HISTORY: Past Surgical History:  Procedure Laterality Date  . ANKLE ARTHROSCOPY    . BACK SURGERY    . CARDIAC CATHETERIZATION    . IVC FILTER INSERTION    . KYPHOPLASTY N/A 12/21/2016   Procedure: KYPHOPLASTY;  Surgeon: Kennedy Bucker, MD;  Location: [facility] ORS;  Service: Orthopedics;  Laterality: N/A;  . PERIPHERAL VASCULAR CATHETERIZATION N/A 03/16/2016   Procedure: IVC Filter Insertion;  Surgeon: Renford Dills, MD;  Location: [facility] INVASIVE CV LAB;  Service: Cardiovascular;  Laterality: N/A;  . SINUS EXPLORATION    . TONSILLECTOMY    . WRIST ARTHROSCOPY Right     SOCIAL HISTORY: Social History   Social History  . Marital status: Widowed    Spouse name: N/A  . Number of children: N/A  . Years of education: N/A   Occupational History  . Not on file.   Social History Main Topics  . Smoking status: Former Smoker    Quit date: 12/16/2001  . Smokeless tobacco: Never Used  . Alcohol use No  . Drug use: No  . Sexual activity: Not on file   Other Topics Concern  . Not on file   Social History Narrative  . No narrative on file    FAMILY HISTORY: Family History  Problem Relation Age of Onset  . CAD Unknown   . Asthma Mother   . Cirrhosis Father   . Deep vein thrombosis Neg Hx   .  Diabetes Neg Hx     ALLERGIES:  is allergic to aspirin; bee venom; penicillins; prednisone; sulfa antibiotics; sulfasalazine; theophyllines; and theophylline.  MEDICATIONS:  Current Facility-Administered Medications  Medication Dose Route Frequency Provider Last Rate Last Dose  . 0.9 %  sodium chloride infusion   Intravenous Continuous Enedina Finner, MD 75 mL/hr at 06/08/17 1044    . abacavir (ZIAGEN) tablet 600 mg  600 mg Oral Daily Sudini, Wardell Heath, MD   600 mg at 06/08/17 1610   And  . dolutegravir (TIVICAY) tablet 50 mg  50 mg Oral Daily Sudini, Wardell Heath, MD   50 mg at 06/08/17 0933   And  . lamiVUDine (EPIVIR) 10 MG/ML solution 100 mg  100 mg Oral Daily Milagros Loll, MD   100 mg at  06/08/17 0935  . acetaminophen (TYLENOL) tablet 650 mg  650 mg Oral Q6H PRN Milagros Loll, MD       Or  . acetaminophen (TYLENOL) suppository 650 mg  650 mg Rectal Q6H PRN Sudini, Srikar, MD      . albuterol (PROVENTIL) (2.5 MG/3ML) 0.083% nebulizer solution 2.5 mg  2.5 mg Nebulization Q2H PRN Sudini, Srikar, MD      . albuterol (PROVENTIL) (2.5 MG/3ML) 0.083% nebulizer solution 2.5 mg  2.5 mg Nebulization TID Milagros Loll, MD   2.5 mg at 06/08/17 1929  . amiodarone (PACERONE) tablet 400 mg  400 mg Oral Daily Milagros Loll, MD   400 mg at 06/08/17 0935  . atorvastatin (LIPITOR) tablet 10 mg  10 mg Oral QPC breakfast Milagros Loll, MD   10 mg at 06/08/17 0934  . bisacodyl (DULCOLAX) suppository 10 mg  10 mg Rectal Daily PRN Sudini, Wardell Heath, MD      . cyclobenzaprine (FLEXERIL) tablet 5 mg  5 mg Oral TID PRN Milagros Loll, MD      . escitalopram (LEXAPRO) tablet 20 mg  20 mg Oral QPC breakfast Sudini, Srikar, MD   20 mg at 06/08/17 0933  . heparin ADULT infusion 100 units/mL (25000 units/222mL sodium chloride 0.45%)  650 Units/hr Intravenous Continuous Enedina Finner, MD 6.5 mL/hr at 06/08/17 2020 650 Units/hr at 06/08/17 2020  . HYDROcodone-acetaminophen (NORCO/VICODIN) 5-325 MG per tablet 1 tablet  1 tablet Oral Q6H PRN Milagros Loll, MD   1 tablet at 06/08/17 1614  . insulin aspart (novoLOG) injection 0-15 Units  0-15 Units Subcutaneous TID WC Sudini, Srikar, MD      . insulin aspart (novoLOG) injection 0-5 Units  0-5 Units Subcutaneous QHS Sudini, Srikar, MD      . levothyroxine (SYNTHROID, LEVOTHROID) tablet 50 mcg  50 mcg Oral QAC breakfast Milagros Loll, MD   50 mcg at 06/08/17 0934  . linaclotide (LINZESS) capsule 72 mcg  72 mcg Oral QPC breakfast Milagros Loll, MD   72 mcg at 06/08/17 0935  . loperamide (IMODIUM) capsule 2 mg  2 mg Oral Q6H PRN Arnaldo Natal, MD      . metoprolol succinate (TOPROL-XL) 24 hr tablet 100 mg  100 mg Oral Daily Milagros Loll, MD   100 mg at 06/08/17  0933  . mirtazapine (REMERON) tablet 15 mg  15 mg Oral QHS Milagros Loll, MD   15 mg at 06/07/17 2154  . mometasone-formoterol (DULERA) 200-5 MCG/ACT inhaler 2 puff  2 puff Inhalation BID Milagros Loll, MD   2 puff at 06/08/17 0936  . ondansetron (ZOFRAN) tablet 4 mg  4 mg Oral Q6H PRN Milagros Loll, MD       Or  . ondansetron (  ZOFRAN) injection 4 mg  4 mg Intravenous Q6H PRN Sudini, Srikar, MD      . polyethylene glycol (MIRALAX / GLYCOLAX) packet 17 g  17 g Oral Daily PRN Sudini, Wardell Heath, MD      . pregabalin (LYRICA) capsule 25 mg  25 mg Oral BID Milagros Loll, MD   25 mg at 06/08/17 0954  . sodium chloride flush (NS) 0.9 % injection 3 mL  3 mL Intravenous Q12H Milagros Loll, MD   3 mL at 06/08/17 0939  . tiotropium (SPIRIVA) inhalation capsule 18 mcg  18 mcg Inhalation Daily Milagros Loll, MD   18 mcg at 06/08/17 0935      .  PHYSICAL EXAMINATION:  Vitals:   06/08/17 1929 06/08/17 2026  BP:  (!) 101/57  Pulse: 64 65  Resp: 18 20  Temp:  97.8 F (36.6 C)  SpO2: 97% 100%   Filed Weights   06/07/17 1233 06/08/17 0341  Weight: 180 lb (81.6 kg) 181 lb 4.8 oz (82.2 kg)    GENERAL: Well-nourished well-developed; Alert, no distress and comfortable.   Alone. Multiple ecchymosis. EYES: no pallor or icterus OROPHARYNX: no thrush or ulceration. NECK: supple, no masses felt LYMPH:  no palpable lymphadenopathy in the cervical, axillary or inguinal regions LUNGS: decreased breath sounds to auscultation at bases and  No wheeze or crackles HEART/CVS: regular rate & rhythm and no murmurs; bilateral lower extremity swelling right more than left.  ABDOMEN: abdomen soft, non-tender and normal bowel sounds; positive for Foley catheter. Musculoskeletal:no cyanosis of digits and no clubbing  PSYCH: alert & oriented x 3 with fluent speech NEURO: no focal motor/sensory deficits SKIN:  Multiple ecchymosis.  LABORATORY DATA:  I have reviewed the data as listed Lab Results  Component Value  Date   WBC 4.8 06/08/2017   HGB 8.6 (L) 06/08/2017   HCT 26.9 (L) 06/08/2017   MCV 102.8 (H) 06/08/2017   PLT 152 06/08/2017    Recent Labs  05/01/17 1248 05/02/17 1415  05/30/17 1506 06/07/17 1416 06/08/17 0038  NA 139 138  < > 145 142 145  K 3.8 3.1*  < > 4.4 5.4* 3.8  CL 102 102  < > 110 111 114*  CO2 27 24  < > 21* 19* 25  GLUCOSE 101* 113*  < > 118* 99 108*  BUN 22* 17  < > 55* 64* 57*  CREATININE 1.31* 1.31*  < > 3.90* 5.27* 4.31*  CALCIUM 8.6* 8.2*  < > 8.9 8.4* 8.0*  GFRNONAA 58* 58*  < > 15* 11* 14*  GFRAA >60 >60  < > 18* 12* 16*  PROT 7.5 6.5  --   --  7.5  --   ALBUMIN 3.0* 2.6*  --   --  2.3*  --   AST 27 22  --   --  41  --   ALT 16* 17  --   --  20  --   ALKPHOS 196* 156*  --   --  239*  --   BILITOT 1.3* 1.0  --   --  0.9  --   BILIDIR  --  0.2  --   --   --   --   IBILI  --  0.8  --   --   --   --   < > = values in this interval not displayed.  RADIOGRAPHIC STUDIES: I have personally reviewed the radiological images as listed and agreed with the findings in the report. Dg  Chest 2 View  Result Date: 06/07/2017 CLINICAL DATA:  Shortness of breath, abdominal pain, and chest pain. EXAM: CHEST  2 VIEW COMPARISON:  05/02/2017. FINDINGS: Low lung volumes. Stable cardiomediastinal silhouette. Prior vertebral augmentation midthoracic region. No consolidation or edema. No effusion or pneumothorax. Healing RIGHT-sided rib fractures. IMPRESSION: Healing rib fractures.  No active disease. Electronically Signed   By: Elsie Stain M.D.   On: 06/07/2017 15:18   US Renal  Result Date: 06/07/2017 CLINICAL DATA:  Acute renal insufficiency EXAM: RENAL / URINARY TRACT ULTRASOUND COMPLETE COMPARISON:  CT abdomen and pelvis May 02, 2017 FINDINGS: Right Kidney: Length: 9.5 cm. Echogenicity and renal cortical thickness are within normal limits. No mass or perinephric fluid visualized. There is mild fullness of the right renal collecting system and proximal ureter which  persists after voiding. No sonographically demonstrable calculus evident. Mid and distal portions of ureter not visualized. Left Kidney: Length: 10.3 cm. Echogenicity and renal cortical thickness are within normal limits. No mass or perinephric fluid visualized. There is mild fullness of the left renal collecting system and proximal ureter which persists after voiding. No sonographically demonstrable calculus evident. Mid and distal portions of ureter not visualized. Bladder: Appears normal for degree of bladder distention. Urinary bladder is diffusely distended. Prevoid urinary bladder volume is measured at 928 mL. Postvoid residual measures 890 mL. IMPRESSION: Urinary bladder distention with large postvoid residual. This finding raises concern for urinary bladder outlet obstruction. Fullness of each renal collecting system is noted with persistence after attempted voiding, likely due to stasis phenomenon from the urinary bladder distention. Study otherwise unremarkable. Electronically Signed   By: Bretta Bang III M.D.   On: 06/07/2017 16:51   US Venous Img Lower Bilateral  Result Date: 06/07/2017 CLINICAL DATA:  Bilateral lower extremity edema EXAM: BILATERAL LOWER EXTREMITY VENOUS DUPLEX ULTRASOUND TECHNIQUE: Gray-scale sonography with graded compression, as well as color Doppler and duplex ultrasound were performed to evaluate the lower extremity deep venous systems from the level of the common femoral vein and including the common femoral, femoral, profunda femoral, popliteal and calf veins including the posterior tibial, peroneal and gastrocnemius veins when visible. The superficial great saphenous vein was also interrogated. Spectral Doppler was utilized to evaluate flow at rest and with distal augmentation maneuvers in the common femoral, femoral and popliteal veins. COMPARISON:  None. FINDINGS: RIGHT LOWER EXTREMITY Common Femoral Vein: No evidence of thrombus. Normal compressibility, respiratory  phasicity and response to augmentation. Saphenofemoral Junction: No evidence of thrombus. Normal compressibility and flow on color Doppler imaging. Profunda Femoral Vein: No evidence of thrombus. Normal compressibility and flow on color Doppler imaging. Femoral Vein: There is hypoechoic thrombus throughout much of the proximal right femoral vein which is partially compressible with limited Doppler flow. There is felt to be completely obstructing acute deep venous thrombosis in this region. Popliteal Vein: No evidence of thrombus. Normal compressibility, respiratory phasicity and response to augmentation. Calf Veins: No evidence of thrombus. Normal compressibility and flow on color Doppler imaging. Superficial Great Saphenous Vein: No evidence of thrombus. Normal compressibility. Venous Reflux:  None. Other Findings:  None. LEFT LOWER EXTREMITY Common Femoral Vein: No evidence of thrombus. Normal compressibility, respiratory phasicity and response to augmentation. Saphenofemoral Junction: No evidence of thrombus. Normal compressibility and flow on color Doppler imaging. Profunda Femoral Vein: No evidence of thrombus. Normal compressibility and flow on color Doppler imaging. Femoral Vein: No evidence of thrombus. Normal compressibility, respiratory phasicity and response to augmentation. Popliteal Vein: No evidence of thrombus. Normal  compressibility, respiratory phasicity and response to augmentation. Calf Veins: No evidence of thrombus. Normal compressibility and flow on color Doppler imaging. Superficial Great Saphenous Vein: No evidence of thrombus. Normal compressibility. Venous Reflux:  None. Other Findings:  None. IMPRESSION: Evidence of acute deep venous thrombosis, incompletely obstructing, in portions of the proximal right femoral vein. No other foci of deep venous thrombosis in either lower extremity. Electronically Signed   By: Bretta BangWilliam  Woodruff III M.D.   On: 06/07/2017 17:03    ASSESSMENT & PLAN:    #  61 year old male patient with multiple medical problems- history of multiple DVT/PE-most recently on Arixtra is currently admitted to hospital for acute renal failure/difficulty urination.  # History of multiple recurrent DVT/PE- most recently started on Arixtra [given previous thrombotic events on NOACs/lovenox]- ultrasound of the right lower extremity shows- acute DVT/incomplete obstruction and proximal femoral vein. Given the acute renal failure- I agree with IV heparin; however once function-is back to baseline; however recommend switching over to Arixtra at discharge.  # Difficulty with urination- question bladder outlet obstruction status post Foley catheter.   # Acute renal failure- unclear etiology. Awaiting nephrology evaluation  #  HIV/ mild worsening of chronic macrocytic anemia-monitor for now. Check B12 folic acid.  Thank you Dr.Patel for allowing me to participate in the care of your pleasant patient. Please do not hesitate to contact me with questions or concerns in the interim. All questions were answered. The patient knows to call the clinic with any problems, questions or concerns.    Earna CoderGovinda R Randolf Sansoucie, MD 06/08/2017 10:15 PM

## 2017-06-08 NOTE — Plan of Care (Signed)
Problem: Activity: Goal: Risk for activity intolerance will decrease Outcome: Not Progressing Patient has weakness due to hips.

## 2017-06-08 NOTE — Progress Notes (Signed)
Patient's blood sugar decreased to 66 during the night.  He was given a snack and ginger ale.  Blood sugar increase to 92.  Continuing to monitor patient.  Arturo MortonClay, Chadrick Sprinkle N  06/08/2017  5:42 AM

## 2017-06-08 NOTE — Progress Notes (Signed)
I discussed the case with Dr. Allena KatzPatel and will have the patient discharged with the foley [facility].

## 2017-06-08 NOTE — Progress Notes (Addendum)
ANTICOAGULATION CONSULT NOTE - Initial Consult  Pharmacy Consult for heparin  Indication: VTE treatment  Allergies  Allergen Reactions  . Aspirin Anaphylaxis  . Bee Venom Anaphylaxis  . Penicillins Anaphylaxis and Other (See Comments)    Has patient had a PCN reaction causing immediate rash, facial/tongue/throat swelling, SOB or lightheadedness with hypotension: Yes Has patient had a PCN reaction causing severe rash involving mucus membranes or skin necrosis: No Has patient had a PCN reaction that required hospitalization No Has patient had a PCN reaction occurring within the last 10 years: Yes If all of the above answers are "NO", then may proceed with Cephalosporin use.  . Prednisone Anaphylaxis  . Sulfa Antibiotics Anaphylaxis  . Sulfasalazine Anaphylaxis  . Theophyllines Anaphylaxis  . Theophylline Swelling    Patient Measurements: Height: 5\' 5"  (165.1 cm) Weight: 181 lb 4.8 oz (82.2 kg) IBW/kg (Calculated) : 61.5 Heparin Dosing Weight: 78.3kg  Vital Signs: Temp: 97.9 F (36.6 C) (10/31 0431) Temp Source: Oral (10/31 0431) BP: 108/68 (10/31 0431) Pulse Rate: 67 (10/31 0431)  Labs:  Recent Labs  06/07/17 1234 06/07/17 1416 06/07/17 1423 06/08/17 0038 06/08/17 1126  HGB  --  10.7*  --  8.6*  --   HCT  --  33.0*  --  26.9*  --   PLT  --  197  --  152  --   APTT  --   --  35  --  >160*  LABPROT  --   --  17.3*  --   --   INR  --   --  1.43  --   --   HEPARINUNFRC  --   --  2.34* 3.12* 1.86*  CREATININE  --  5.27*  --  4.31*  --   CKTOTAL  --  113  --   --   --   TROPONINI <0.03  --   --   --   --     Estimated Creatinine Clearance: 18 mL/min (A) (by C-G formula based on SCr of 4.31 mg/dL (H)).   Medical History: Past Medical History:  Diagnosis Date  . Anemia   . Asthma   . Cataracts, bilateral    worse in Rt eye  . Collagen vascular disease (HCC)   . COPD (chronic obstructive pulmonary disease) (HCC)   . Coronary artery disease   . Depression   .  DVT (deep venous thrombosis) (HCC)   . Dysrhythmia   . Emphysema/COPD (HCC)   . Environmental and seasonal allergies   . Family history of adverse reaction to anesthesia    sister PONV  . GERD (gastroesophageal reflux disease)   . H/O blood clots   . Heart murmur   . HIV (human immunodeficiency virus infection) (HCC)   . Hypertension   . Leaky heart valve    x 3  . Lung mass   . Myocardial infarction (HCC)    in 2000  . Pneumonia    year ago  . Pulmonary emboli (HCC)   . Type 2 diabetes mellitus (HCC)     Medications:  Infusions:  . sodium chloride 75 mL/hr at 06/08/17 1044  . heparin      Assessment: 61 yom cc dizziness with PMH DM, COPD, DVT and PE with IVC on Arixtra, CKD, avascular necrosis  Goal of Therapy:  Heparin level 0.3-0.7 units/ml Monitor platelets by anticoagulation protocol: Yes   Plan:  HL and APTT elevated, therefore appears to be corrolating and affect of Fondaparinux on HL has  worn off. Hold heparin drip 1 hr and resume at lower rate, 700 units/hr. Will check both a APTT and HL one more time for safe measure. Recheck in 6 hours. Abigail, RN aware to hold drip x 1 hr  Kyshon Tolliver D Zander Ingham, Pharm.D, BCPS Clinical Pharmacist  1:01 PM

## 2017-06-08 NOTE — Progress Notes (Signed)
[facility] [place] [facility]   PATIENT NAME: Brian Hampton    MR#:  [id number]  DATE OF BIRTH:  1955/10/22  SUBJECTIVE:    REVIEW OF SYSTEMS:   ROS Tolerating Diet: Tolerating PT:   DRUG ALLERGIES:   Allergies  Allergen Reactions  . Aspirin Anaphylaxis  . Bee Venom Anaphylaxis  . Penicillins Anaphylaxis and Other (See Comments)    Has patient had a PCN reaction causing immediate rash, facial/tongue/throat swelling, SOB or lightheadedness with hypotension: Yes Has patient had a PCN reaction causing severe rash involving mucus membranes or skin necrosis: No Has patient had a PCN reaction that required hospitalization No Has patient had a PCN reaction occurring within the last 10 years: Yes If all of the above answers are "NO", then may proceed with Cephalosporin use.  . Prednisone Anaphylaxis  . Sulfa Antibiotics Anaphylaxis  . Sulfasalazine Anaphylaxis  . Theophyllines Anaphylaxis  . Theophylline Swelling    VITALS:  Blood pressure 108/68, pulse 67, temperature 97.9 F (36.6 C), temperature source Oral, resp. rate 18, height 5\' 5"  (1.651 m), weight 82.2 kg (181 lb 4.8 oz), SpO2 97 %.  PHYSICAL EXAMINATION:   Physical Exam  GENERAL:  61 y.o.-year-old patient lying in the bed with no acute distress.  EYES: Pupils equal, round, reactive to light and accommodation. No scleral icterus. Extraocular muscles intact.  HEENT: Head atraumatic, normocephalic. Oropharynx and nasopharynx clear.  NECK:  Supple, no jugular venous distention. No thyroid enlargement, no tenderness.  LUNGS: Normal breath sounds bilaterally, no wheezing, rales, rhonchi. No use of accessory muscles of respiration.  CARDIOVASCULAR: S1, S2 normal. No murmurs, rubs, or gallops.  ABDOMEN: Soft, nontender, nondistended. Bowel sounds present. No organomegaly or mass.  EXTREMITIES: No cyanosis, clubbing or edema b/l.    NEUROLOGIC: Cranial nerves II through XII are intact. No focal  Motor or sensory deficits b/l.   PSYCHIATRIC:  patient is alert and oriented x 3.  SKIN: No obvious rash, lesion, or ulcer.   LABORATORY PANEL:  CBC  Recent Labs Lab 06/08/17 0038  WBC 4.8  HGB 8.6*  HCT 26.9*  PLT 152    Chemistries   Recent Labs Lab 06/07/17 1416 06/08/17 0038  NA 142 145  K 5.4* 3.8  CL 111 114*  CO2 19* 25  GLUCOSE 99 108*  BUN 64* 57*  CREATININE 5.27* 4.31*  CALCIUM 8.4* 8.0*  AST 41  --   ALT 20  --   ALKPHOS 239*  --   BILITOT 0.9  --    Cardiac Enzymes  Recent Labs Lab 06/07/17 1234  TROPONINI <0.03   RADIOLOGY:  Dg Chest 2 View  Result Date: 06/07/2017 CLINICAL DATA:  Shortness of breath, abdominal pain, and chest pain. EXAM: CHEST  2 VIEW COMPARISON:  05/02/2017. FINDINGS: Low lung volumes. Stable cardiomediastinal silhouette. Prior vertebral augmentation midthoracic region. No consolidation or edema. No effusion or pneumothorax. Healing RIGHT-sided rib fractures. IMPRESSION: Healing rib fractures.  No active disease. Electronically Signed   By: Elsie StainJohn T Curnes M.D.   On: 06/07/2017 15:18   [place] Renal  Result Date: 06/07/2017 CLINICAL DATA:  Acute renal insufficiency EXAM: RENAL / URINARY TRACT ULTRASOUND COMPLETE COMPARISON:  CT abdomen and pelvis May 02, 2017 FINDINGS: Right Kidney: Length: 9.5 cm. Echogenicity and renal cortical thickness are within normal limits. No mass or perinephric fluid visualized. There is mild fullness of the right renal collecting system and proximal ureter which persists after voiding. No sonographically demonstrable calculus evident. Mid  and distal portions of ureter not visualized. Left Kidney: Length: 10.3 cm. Echogenicity and renal cortical thickness are within normal limits. No mass or perinephric fluid visualized. There is mild fullness of the left renal collecting system and proximal ureter which persists after voiding. No sonographically demonstrable calculus evident. Mid and distal portions of  ureter not visualized. Bladder: Appears normal for degree of bladder distention. Urinary bladder is diffusely distended. Prevoid urinary bladder volume is measured at 928 mL. Postvoid residual measures 890 mL. IMPRESSION: Urinary bladder distention with large postvoid residual. This finding raises concern for urinary bladder outlet obstruction. Fullness of each renal collecting system is noted with persistence after attempted voiding, likely due to stasis phenomenon from the urinary bladder distention. Study otherwise unremarkable. Electronically Signed   By: Bretta Bang III M.D.   On: 06/07/2017 16:51   US Venous Img Lower Bilateral  Result Date: 06/07/2017 CLINICAL DATA:  Bilateral lower extremity edema EXAM: BILATERAL LOWER EXTREMITY VENOUS DUPLEX ULTRASOUND TECHNIQUE: Gray-scale sonography with graded compression, as well as color Doppler and duplex ultrasound were performed to evaluate the lower extremity deep venous systems from the level of the common femoral vein and including the common femoral, femoral, profunda femoral, popliteal and calf veins including the posterior tibial, peroneal and gastrocnemius veins when visible. The superficial great saphenous vein was also interrogated. Spectral Doppler was utilized to evaluate flow at rest and with distal augmentation maneuvers in the common femoral, femoral and popliteal veins. COMPARISON:  None. FINDINGS: RIGHT LOWER EXTREMITY Common Femoral Vein: No evidence of thrombus. Normal compressibility, respiratory phasicity and response to augmentation. Saphenofemoral Junction: No evidence of thrombus. Normal compressibility and flow on color Doppler imaging. Profunda Femoral Vein: No evidence of thrombus. Normal compressibility and flow on color Doppler imaging. Femoral Vein: There is hypoechoic thrombus throughout much of the proximal right femoral vein which is partially compressible with limited Doppler flow. There is felt to be completely  obstructing acute deep venous thrombosis in this region. Popliteal Vein: No evidence of thrombus. Normal compressibility, respiratory phasicity and response to augmentation. Calf Veins: No evidence of thrombus. Normal compressibility and flow on color Doppler imaging. Superficial Great Saphenous Vein: No evidence of thrombus. Normal compressibility. Venous Reflux:  None. Other Findings:  None. LEFT LOWER EXTREMITY Common Femoral Vein: No evidence of thrombus. Normal compressibility, respiratory phasicity and response to augmentation. Saphenofemoral Junction: No evidence of thrombus. Normal compressibility and flow on color Doppler imaging. Profunda Femoral Vein: No evidence of thrombus. Normal compressibility and flow on color Doppler imaging. Femoral Vein: No evidence of thrombus. Normal compressibility, respiratory phasicity and response to augmentation. Popliteal Vein: No evidence of thrombus. Normal compressibility, respiratory phasicity and response to augmentation. Calf Veins: No evidence of thrombus. Normal compressibility and flow on color Doppler imaging. Superficial Great Saphenous Vein: No evidence of thrombus. Normal compressibility. Venous Reflux:  None. Other Findings:  None. IMPRESSION: Evidence of acute deep venous thrombosis, incompletely obstructing, in portions of the proximal right femoral vein. No other foci of deep venous thrombosis in either lower extremity. Electronically Signed   By: Bretta Bang III M.D.   On: 06/07/2017 17:03   ASSESSMENT AND PLAN:  Cordai Rodrigue  is a 61 y.o. 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  Baseline creatinine is 1.2 and today  is 5.5 with potassium of 5.7.  The patient has noticed some blood in his urine  * AKI over CKD3--due to obstructive  uropathy -Patient has baseline creatinine of 1.4 -ultrasound renal shows--bladder outlet obstruction - urology consultation placed - continue IV fluids  -Patient came in with creatinine of 5.27 --- 4.31  -  Stop meloxicam.  Associated mild hyperkalemia.  Stop potassium supplements. Avoid nephrotoxic medications.   *Bilateral lower extremity edema.  He does have history of DVT with IVC filter in place.  Patient has history of factor V Leyden deficiency--heterozygous - On Arixtra at home.   -Ultrasound Doppler shows Evidence of acute deep venous thrombosis, incompletely obstructing, in portions of the proximal right femoral vein -  Will change Arixtra to heparin drip.  - Consult oncology for further recommendations regarding long-term anticoagulation with his kidney issues.  *Diabetes mellitus.  Start sliding scale insulin.   -Diabetic diet  * HIV Continue home meds  *Generalized weakness both lower extremities. Patient states he has not walked in the last 2 weeks. He has been having frequent falls at home.  -Physical therapy to see patient   Social worker for discharge planning  Case discussed with Care Management/Social Worker. Management plans discussed with the patient, family and they are in agreement.  CODE STATUS: DO NOT RESUSCITATE  DVT Prophylaxis: IV heparin drip   TOTAL TIME TAKING CARE OF THIS PATIENT: *25* minutes.  >50% time spent on counselling and coordination of care  POSSIBLE D/C IN 1-2 DAYS, DEPENDING ON CLINICAL CONDITION.  Note: This dictation was prepared with Dragon dictation along with smaller phrase technology. Any transcriptional errors that result from this process are unintentional.  Katielynn Horan M.D on 06/08/2017 at 11:05 AM  Between 7am to 6pm - Pager - [phone]  After 6pm go to [email or web address] - password [email or web address]  [facility]  Office  [phone]  CC: Primary care physician; Lyndon Code, MD

## 2017-06-08 NOTE — Progress Notes (Addendum)
ANTICOAGULATION CONSULT NOTE - Initial Consult  Pharmacy Consult for heparin  Indication: VTE treatment  Allergies  Allergen Reactions  . Aspirin Anaphylaxis  . Bee Venom Anaphylaxis  . Penicillins Anaphylaxis and Other (See Comments)    Has patient had a PCN reaction causing immediate rash, facial/tongue/throat swelling, SOB or lightheadedness with hypotension: Yes Has patient had a PCN reaction causing severe rash involving mucus membranes or skin necrosis: No Has patient had a PCN reaction that required hospitalization No Has patient had a PCN reaction occurring within the last 10 years: Yes If all of the above answers are "NO", then may proceed with Cephalosporin use.  . Prednisone Anaphylaxis  . Sulfa Antibiotics Anaphylaxis  . Sulfasalazine Anaphylaxis  . Theophyllines Anaphylaxis  . Theophylline Swelling    Patient Measurements: Height: 5\' 5"  (165.1 cm) Weight: 181 lb 4.8 oz (82.2 kg) IBW/kg (Calculated) : 61.5 Heparin Dosing Weight: 78.3kg  Vital Signs: Temp: 98.1 F (36.7 C) (10/31 1405) Temp Source: Oral (10/31 1405) BP: 110/62 (10/31 1405) Pulse Rate: 64 (10/31 1929)  Labs:  Recent Labs  06/07/17 1234 06/07/17 1416  06/07/17 1423 06/08/17 0038 06/08/17 1126 06/08/17 1919  HGB  --  10.7*  --   --  8.6*  --   --   HCT  --  33.0*  --   --  26.9*  --   --   PLT  --  197  --   --  152  --   --   APTT  --   --   --  35  --  >160* 114*  LABPROT  --   --   --  17.3*  --   --   --   INR  --   --   --  1.43  --   --   --   HEPARINUNFRC  --   --   < > 2.34* 3.12* 1.86* 1.35*  CREATININE  --  5.27*  --   --  4.31*  --   --   CKTOTAL  --  113  --   --   --   --   --   TROPONINI <0.03  --   --   --   --   --   --   < > = values in this interval not displayed.  Estimated Creatinine Clearance: 18 mL/min (A) (by C-G formula based on SCr of 4.31 mg/dL (H)).   Medical History: Past Medical History:  Diagnosis Date  . Anemia   . Asthma   . Cataracts, bilateral     worse in Rt eye  . Collagen vascular disease (HCC)   . COPD (chronic obstructive pulmonary disease) (HCC)   . Coronary artery disease   . Depression   . DVT (deep venous thrombosis) (HCC)   . Dysrhythmia   . Emphysema/COPD (HCC)   . Environmental and seasonal allergies   . Family history of adverse reaction to anesthesia    sister PONV  . GERD (gastroesophageal reflux disease)   . H/O blood clots   . Heart murmur   . HIV (human immunodeficiency virus infection) (HCC)   . Hypertension   . Leaky heart valve    x 3  . Lung mass   . Myocardial infarction (HCC)    in 2000  . Pneumonia    year ago  . Pulmonary emboli (HCC)   . Type 2 diabetes mellitus (HCC)     Medications:  Infusions:  . sodium chloride  75 mL/hr at 06/08/17 1044  . heparin 700 Units/hr (06/08/17 1329)    Assessment: 61 yom cc dizziness with PMH DM, COPD, DVT and PE with IVC on Arixtra, CKD, avascular necrosis  Goal of Therapy:  Heparin level 0.3-0.7 units/ml Monitor platelets by anticoagulation protocol: Yes   Plan:  HL and APTT elevated, therefore appears to be corrolating and affect of Fondaparinux on HL has worn off. Hold heparin drip 1 hr and resume at lower rate, 700 units/hr. Will check both a APTT and HL one more time for safe measure. Recheck in 6 hours. Abigail, RN aware to hold drip x 1 hr  10/31 1919 aPTT supratherapeutic. Decrease to 650 units/hr and recheck aPTT in 6 hours. Will draw daily CBC at the same time. HL daily until levels correlate.  Nathan A. [place], [place].D, BCPS Clinical Pharmacist  8:14 PM     11/1 0300 aPTT 86. Continue current regimen. Recheck aPTT, heparin level, and CBC with tomorrow AM labs.  Fulton Reek, PharmD, BCPS  06/09/17 4:27 AM

## 2017-06-08 NOTE — Progress Notes (Signed)
Pharmacy was informed about pt PTT level >160, order to hold for 1 hr, and will place new order , will continue to monitor

## 2017-06-08 NOTE — Progress Notes (Signed)
Called Dr. Sheryle Hailiamond regarding medication for diarrhea per patient request.  Appropriate orders were placed.  Arturo MortonClay, Elysse Polidore N  06/08/2017 9:17 PM

## 2017-06-08 NOTE — Progress Notes (Signed)
[facility]  ROUNDING NOTE   Subjective:  Patient well known to [place] from the office. We follow him for chronic kidney disease stage II. Now presents with difficulty with urination for the past 3 weeks. Renal ultrasound revealed distended bladder. Foley catheter has been placed. Creatinine down to 4.31.  Objective:  Vital signs in last 24 hours:  Temp:  [97.4 F (36.3 C)-98 F (36.7 C)] 97.9 F (36.6 C) (10/31 0431) Pulse Rate:  [67-72] 67 (10/31 0431) Resp:  [18-20] 18 (10/31 0431) BP: (90-130)/(60-74) 108/68 (10/31 0431) SpO2:  [97 %-100 %] 97 % (10/31 0756) Weight:  [82.2 kg (181 lb 4.8 oz)] 82.2 kg (181 lb 4.8 oz) (10/31 0341)  Weight change:  Filed Weights   06/07/17 1233 06/08/17 0341  Weight: 81.6 kg (180 lb) 82.2 kg (181 lb 4.8 oz)    Intake/Output: I/O last 3 completed shifts: In: 971 [I.V.:971] Out: 2400 [Urine:2400]   Intake/Output this shift:  Total I/O In: 416.7 [I.V.:416.7] Out: 525 [Urine:525]  Physical Exam: General: No acute distress  Head: Normocephalic, atraumatic. Moist oral mucosal membranes  Eyes: Anicteric  Neck: Supple, trachea midline  Lungs:  Clear to auscultation, normal effort  Heart: S1S2 no rubs  Abdomen:  Soft, nontender, bowel sounds present  Extremities: Trace peripheral edema.  Neurologic: Awake, alert, following commands  Skin: No lesions  GU: Foley in place    Basic Metabolic Panel:  Recent Labs Lab 06/07/17 1416 06/08/17 0038  NA 142 145  K 5.4* 3.8  CL 111 114*  CO2 19* 25  GLUCOSE 99 108*  BUN 64* 57*  CREATININE 5.27* 4.31*  CALCIUM 8.4* 8.0*    Liver Function Tests:  Recent Labs Lab 06/07/17 1416  AST 41  ALT 20  ALKPHOS 239*  BILITOT 0.9  PROT 7.5  ALBUMIN 2.3*   No results for input(s): LIPASE, AMYLASE in the last 168 hours.  Recent Labs Lab 06/07/17 1420  AMMONIA 10    CBC:  Recent Labs Lab 06/07/17 1416 06/08/17 0038  WBC 6.4 4.8  NEUTROABS 4.3  --   HGB 10.7* 8.6*   HCT 33.0* 26.9*  MCV 104.3* 102.8*  PLT 197 152    Cardiac Enzymes:  Recent Labs Lab 06/07/17 1234 06/07/17 1416  CKTOTAL  --  113  TROPONINI <0.03  --     BNP: Invalid input(s): POCBNP  CBG:  Recent Labs Lab 06/08/17 0136 06/08/17 0348 06/08/17 0451 06/08/17 0802 06/08/17 1117  GLUCAP 84 66 92 103* 106*    Microbiology: Results for orders placed or performed during the hospital encounter of 05/30/17  Urine culture     Status: Abnormal   Collection Time: 05/30/17  3:06 PM  Result Value Ref Range Status   Specimen Description URINE, RANDOM  Final   Special Requests NONE  Final   Culture (A)  Final    <10,000 COLONIES/mL Performed at [facility], [address], [place], [place] [address]    Report Status 05/31/2017 FINAL  Final  Surgical pcr screen     Status: None   Collection Time: 05/30/17  3:06 PM  Result Value Ref Range Status   MRSA, PCR NEGATIVE NEGATIVE Final   Staphylococcus aureus NEGATIVE NEGATIVE Final    Comment: (NOTE) The Xpert SA Assay (FDA approved for NASAL specimens in patients 108 years of age and older), is one component of a comprehensive surveillance program. It is not intended to diagnose infection nor to guide or monitor treatment.  Coagulation Studies:  Recent Labs  06/07/17 1423  LABPROT 17.3*  INR 1.43    Urinalysis:  Recent Labs  06/07/17 1716  COLORURINE YELLOW*  LABSPEC 1.005  PHURINE 6.0  GLUCOSEU NEGATIVE  HGBUR NEGATIVE  BILIRUBINUR NEGATIVE  KETONESUR NEGATIVE  PROTEINUR NEGATIVE  NITRITE NEGATIVE  LEUKOCYTESUR NEGATIVE      Imaging: Dg Chest 2 View  Result Date: 06/07/2017 CLINICAL DATA:  Shortness of breath, abdominal pain, and chest pain. EXAM: CHEST  2 VIEW COMPARISON:  05/02/2017. FINDINGS: Low lung volumes. Stable cardiomediastinal silhouette. Prior vertebral augmentation midthoracic region. No consolidation or edema. No effusion or pneumothorax. Healing RIGHT-sided rib fractures.  IMPRESSION: Healing rib fractures.  No active disease. Electronically Signed   By: Elsie StainJohn T Curnes M.D.   On: 06/07/2017 15:18   [place] Renal  Result Date: 06/07/2017 CLINICAL DATA:  Acute renal insufficiency EXAM: RENAL / URINARY TRACT ULTRASOUND COMPLETE COMPARISON:  CT abdomen and pelvis May 02, 2017 FINDINGS: Right Kidney: Length: 9.5 cm. Echogenicity and renal cortical thickness are within normal limits. No mass or perinephric fluid visualized. There is mild fullness of the right renal collecting system and proximal ureter which persists after voiding. No sonographically demonstrable calculus evident. Mid and distal portions of ureter not visualized. Left Kidney: Length: 10.3 cm. Echogenicity and renal cortical thickness are within normal limits. No mass or perinephric fluid visualized. There is mild fullness of the left renal collecting system and proximal ureter which persists after voiding. No sonographically demonstrable calculus evident. Mid and distal portions of ureter not visualized. Bladder: Appears normal for degree of bladder distention. Urinary bladder is diffusely distended. Prevoid urinary bladder volume is measured at 928 mL. Postvoid residual measures 890 mL. IMPRESSION: Urinary bladder distention with large postvoid residual. This finding raises concern for urinary bladder outlet obstruction. Fullness of each renal collecting system is noted with persistence after attempted voiding, likely due to stasis phenomenon from the urinary bladder distention. Study otherwise unremarkable. Electronically Signed   By: Bretta BangWilliam  Woodruff III M.D.   On: 06/07/2017 16:51   [place] Venous Img Lower Bilateral  Result Date: 06/07/2017 CLINICAL DATA:  Bilateral lower extremity edema EXAM: BILATERAL LOWER EXTREMITY VENOUS DUPLEX ULTRASOUND TECHNIQUE: Gray-scale sonography with graded compression, as well as color Doppler and duplex ultrasound were performed to evaluate the lower extremity deep venous systems  from the level of the common femoral vein and including the common femoral, femoral, profunda femoral, popliteal and calf veins including the posterior tibial, peroneal and gastrocnemius veins when visible. The superficial great saphenous vein was also interrogated. Spectral Doppler was utilized to evaluate flow at rest and with distal augmentation maneuvers in the common femoral, femoral and popliteal veins. COMPARISON:  None. FINDINGS: RIGHT LOWER EXTREMITY Common Femoral Vein: No evidence of thrombus. Normal compressibility, respiratory phasicity and response to augmentation. Saphenofemoral Junction: No evidence of thrombus. Normal compressibility and flow on color Doppler imaging. Profunda Femoral Vein: No evidence of thrombus. Normal compressibility and flow on color Doppler imaging. Femoral Vein: There is hypoechoic thrombus throughout much of the proximal right femoral vein which is partially compressible with limited Doppler flow. There is felt to be completely obstructing acute deep venous thrombosis in this region. Popliteal Vein: No evidence of thrombus. Normal compressibility, respiratory phasicity and response to augmentation. Calf Veins: No evidence of thrombus. Normal compressibility and flow on color Doppler imaging. Superficial Great Saphenous Vein: No evidence of thrombus. Normal compressibility. Venous Reflux:  None. Other Findings:  None. LEFT LOWER EXTREMITY Common Femoral Vein: No  evidence of thrombus. Normal compressibility, respiratory phasicity and response to augmentation. Saphenofemoral Junction: No evidence of thrombus. Normal compressibility and flow on color Doppler imaging. Profunda Femoral Vein: No evidence of thrombus. Normal compressibility and flow on color Doppler imaging. Femoral Vein: No evidence of thrombus. Normal compressibility, respiratory phasicity and response to augmentation. Popliteal Vein: No evidence of thrombus. Normal compressibility, respiratory phasicity and  response to augmentation. Calf Veins: No evidence of thrombus. Normal compressibility and flow on color Doppler imaging. Superficial Great Saphenous Vein: No evidence of thrombus. Normal compressibility. Venous Reflux:  None. Other Findings:  None. IMPRESSION: Evidence of acute deep venous thrombosis, incompletely obstructing, in portions of the proximal right femoral vein. No other foci of deep venous thrombosis in either lower extremity. Electronically Signed   By: Bretta Bang III M.D.   On: 06/07/2017 17:03     Medications:   . sodium chloride 75 mL/hr at 06/08/17 1044  . heparin 700 Units/hr (06/08/17 1329)   . abacavir  600 mg Oral Daily   And  . dolutegravir  50 mg Oral Daily   And  . lamiVUDine  100 mg Oral Daily  . albuterol  2.5 mg Nebulization TID  . amiodarone  400 mg Oral Daily  . atorvastatin  10 mg Oral QPC breakfast  . escitalopram  20 mg Oral QPC breakfast  . insulin aspart  0-15 Units Subcutaneous TID WC  . insulin aspart  0-5 Units Subcutaneous QHS  . levothyroxine  50 mcg Oral QAC breakfast  . linaclotide  72 mcg Oral QPC breakfast  . metoprolol succinate  100 mg Oral Daily  . mirtazapine  15 mg Oral QHS  . mometasone-formoterol  2 puff Inhalation BID  . pregabalin  25 mg Oral BID  . sodium chloride flush  3 mL Intravenous Q12H  . tiotropium  18 mcg Inhalation Daily   acetaminophen **OR** acetaminophen, albuterol, bisacodyl, cyclobenzaprine, HYDROcodone-acetaminophen, ondansetron **OR** ondansetron (ZOFRAN) IV, polyethylene glycol  Assessment/ Plan:  61 y.o. male with past medical history of COPD, coronary artery disease, GERD, HIV, hypertension, history of pulmonary embolism, diabetes mellitus type 2, depression who presented with difficulties with urination.  1. Acute renal failure secondary to bladder outlet obstruction. 2. Bladder outlet obstruction. 3. Chronic kidney disease stage II. 4. Hypertension. 5. Anemia of chronic kidney  disease.  Plan: Patient port that he's been having difficulties with urination for at least 3 weeks. He was found have bladder outlet obstruction and very distended bladder on renal ultrasound. Foley catheter has been placed. We recommend urology consultation. Renal function has started to improve as creatinine is down to 4.31. No urgent indication for dialysis at the moment. Continue to monitor hemoglobin as the patient may end up requiring Epogen if hemoglobin remains low. Thanks for consultation.   LOS: 1 Oddie Bottger 10/31/20182:02 PM

## 2017-06-09 ENCOUNTER — Telehealth: Payer: Self-pay | Admitting: Urology

## 2017-06-09 ENCOUNTER — Encounter: Admission: RE | Payer: Self-pay | Source: Ambulatory Visit

## 2017-06-09 ENCOUNTER — Inpatient Hospital Stay: Admission: RE | Admit: 2017-06-09 | Payer: Medicare Other | Source: Ambulatory Visit | Admitting: Orthopedic Surgery

## 2017-06-09 DIAGNOSIS — I82411 Acute embolism and thrombosis of right femoral vein: Secondary | ICD-10-CM

## 2017-06-09 DIAGNOSIS — D539 Nutritional anemia, unspecified: Secondary | ICD-10-CM

## 2017-06-09 DIAGNOSIS — Z7901 Long term (current) use of anticoagulants: Secondary | ICD-10-CM

## 2017-06-09 LAB — GLUCOSE, CAPILLARY
GLUCOSE-CAPILLARY: 124 mg/dL — AB (ref 65–99)
GLUCOSE-CAPILLARY: 76 mg/dL (ref 65–99)
GLUCOSE-CAPILLARY: 84 mg/dL (ref 65–99)
Glucose-Capillary: 81 mg/dL (ref 65–99)
Glucose-Capillary: 73 mg/dL (ref 65–99)

## 2017-06-09 LAB — CBC
HCT: 24.7 % — ABNORMAL LOW (ref 40.0–52.0)
HEMOGLOBIN: 8.2 g/dL — AB (ref 13.0–18.0)
MCH: 34.1 pg — AB (ref 26.0–34.0)
MCHC: 33.3 g/dL (ref 32.0–36.0)
MCV: 102.7 fL — ABNORMAL HIGH (ref 80.0–100.0)
PLATELETS: 161 10*3/uL (ref 150–440)
RBC: 2.41 MIL/uL — ABNORMAL LOW (ref 4.40–5.90)
RDW: 17.7 % — AB (ref 11.5–14.5)
WBC: 6.3 10*3/uL (ref 3.8–10.6)

## 2017-06-09 LAB — CREATININE, SERUM
Creatinine, Ser: 2.48 mg/dL — ABNORMAL HIGH (ref 0.61–1.24)
GFR, EST AFRICAN AMERICAN: 31 mL/min — AB (ref >60)
GFR, EST NON AFRICAN AMERICAN: 27 mL/min — AB (ref >60)

## 2017-06-09 LAB — APTT
APTT: 86 s — AB (ref 24–36)
APTT: 73 s — AB (ref 24–36)

## 2017-06-09 LAB — HEPARIN LEVEL (UNFRACTIONATED): Heparin Unfractionated: 0.72 IU/mL — ABNORMAL HIGH (ref 0.30–0.70)

## 2017-06-09 SURGERY — ARTHROPLASTY, HIP, TOTAL, ANTERIOR APPROACH
Anesthesia: Choice | Laterality: Left

## 2017-06-09 MED ORDER — LAMIVUDINE 10 MG/ML PO SOLN
150.0000 mg | Freq: Every day | ORAL | Status: DC
  Administered 2017-06-10: 150 mg via ORAL
  Filled 2017-06-09: qty 15

## 2017-06-09 MED ORDER — DOLUTEGRAVIR SODIUM 50 MG PO TABS
50.0000 mg | ORAL_TABLET | Freq: Every day | ORAL | Status: DC
  Administered 2017-06-10: 50 mg via ORAL
  Filled 2017-06-09: qty 1

## 2017-06-09 MED ORDER — ABACAVIR SULFATE 300 MG PO TABS
600.0000 mg | ORAL_TABLET | Freq: Every day | ORAL | Status: DC
  Administered 2017-06-10: 600 mg via ORAL
  Filled 2017-06-09: qty 2

## 2017-06-09 MED ORDER — ALBUTEROL SULFATE (2.5 MG/3ML) 0.083% IN NEBU
2.5000 mg | INHALATION_SOLUTION | Freq: Two times a day (BID) | RESPIRATORY_TRACT | Status: DC
  Administered 2017-06-10: 2.5 mg via RESPIRATORY_TRACT
  Filled 2017-06-09: qty 3

## 2017-06-09 NOTE — Telephone Encounter (Signed)
App made will contact patient ° °Brian Hampton °

## 2017-06-09 NOTE — Clinical Social Work Note (Signed)
Clinical Social Work Assessment  Patient Details  Name: Brian Hampton MRN: [id number] Date of Birth: 1955-09-01  Date of referral:  06/09/17               Reason for consult:  Discharge Planning                Permission sought to share information with:  [facility] Contact Representative Permission granted to share information::  Yes, Verbal Permission Granted  Name::        Agency::     Relationship::     Contact Information:     Housing/Transportation Living arrangements for the past 2 months:  Single Family Home Source of Information:  Patient Patient Interpreter Needed:  None Criminal Activity/Legal Involvement Pertinent to Current Situation/Hospitalization:  No - Comment as needed Significant Relationships:  Siblings Lives with:  Siblings Do you feel safe going back to the place where you live?  Yes Need for family participation in patient care:  Yes (Comment)  Care giving concerns:  Patient resides at home with his sister.   Social Worker assessment / plan:  RN CM spoke with [facility] and they informed the RN CM that patient's house is roach infested. RN CM asked if they had called DSS APS and they had not. RN CM encouraged them to do so. PT assessed patient and recommended home with home health. Due to patient's medical situation, he may be able to go to rehab for a period of time in order to have his healthcare needs managed. CSW spoke with patient who stated that he would consider rehab again. He stated that [place] and was [place] 2 [place] and just got out this week. He stated he hated it there. Patient states he is going to speak with his sister to determine if he will decide to return home or go to rehab. Bed search initiated.  Employment status:  Disabled (Comment on whether or not currently receiving Disability) Insurance information:  Medicare, [place] PT Recommendations:  Home with Home Health Information / Referral to  community resources:     Patient/Family's Response to care:  Patient expressed appreciation for CSW assistance.  Patient/Family's Understanding of and Emotional Response to Diagnosis, Current Treatment, and Prognosis:  Patient is not fully aware of how going home might sabotage his recovery.  Emotional Assessment Appearance:  Appears stated age Attitude/Demeanor/Rapport:   (pleasant and cooperative) Affect (typically observed):  Accepting, Calm, Pleasant Orientation:  Oriented to Self, Oriented to Place, Oriented to  Time, Oriented to Situation Alcohol / Substance use:  Not Applicable Psych involvement (Current and /or in the community):  No (Comment)  Discharge Needs  Concerns to be addressed:  Care Coordination Readmission within the last 30 days:  No Current discharge risk:  None Barriers to Discharge:  No Barriers Identified   York SpanielMonica Arshan Jabs, LCSW 06/09/2017, 4:12 PM

## 2017-06-09 NOTE — Care Management (Signed)
Brian Hampton is being followed by [facility].  Informed that Brian Hampton's home environment is roach infested and unclean.  [facility] has been trying to find Brian Hampton another apartment or room.  Brian Hampton's sister Brian Hampton has HCPOA.  [facility] says sister's involvement in care is very limited.   [facility] was proving SN and SW.  Discussed it may be of benefit to contact [place] DSS to evaluate home environment and agency may be able to provide some assist to improve it. Brian Hampton is alert and oriented.   CM spoke with Brian Hampton and he stated he would be in agreement to go to skilled nursing if needed from this hospital stay.  CM also spoke with him about  a considering assisted living and how it would benefit his overall health status which may allow him to proceed with treatment of his orthopedic issues.   Brian Hampton says his walker broke in two pieces about 3 years ago.  He does not know how long he had it. Updated CSW.  [facility] will come to the unit tomorrow and speak with Brian Hampton

## 2017-06-09 NOTE — NC FL2 (Signed)
[place] MEDICAID FL2 LEVEL OF CARE SCREENING TOOL     IDENTIFICATION  Patient Name: Brian Hampton Birthdate: Aug 12, 1955 Sex: male Admission Date (Current Location): 06/07/2017  [place] and [place] Number:  Chiropodist and Address:  [facility], [address], [place], [place] [address]      Provider Number: [id number]  Attending Physician Name and Address:  Enedina Finner, MD  Relative Name and Phone Number:       Current Level of Care: Hospital Recommended Level of Care: Skilled Nursing Facility Prior Approval Number:    Date Approved/Denied:   PASRR Number: [id number]  Discharge Plan: SNF    Current Diagnoses: Patient Active Problem List   Diagnosis Date Noted  . AKI (acute kidney injury) (HCC) 06/07/2017  . Avascular necrosis of bones of both hips (HCC) 05/04/2017  . 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 Falls 05/02/2017  . Acute deep vein thrombosis (DVT) of femoral vein of right lower extremity (HCC) 04/04/2017  . Left leg pain 03/22/2017  . DVT (deep venous thrombosis) (HCC) 03/12/2017  . Acute deep vein thrombosis (DVT) of right lower extremity (HCC) 02/25/2017  . Postoperative pain   . COPD exacerbation (HCC) 12/23/2016  . Abdominal pain   . Compression fracture of body of thoracic vertebra (HCC) 12/21/2016  . Hypomagnesemia 05/05/2016  . Rhabdomyolysis 05/05/2016  . Acute pulmonary embolism (HCC) 03/17/2016  . Right leg DVT (HCC) 03/17/2016  . Hypokalemia 03/17/2016  . S/P IVC filter 03/17/2016  . SOB (shortness of breath) 03/15/2016  . Dysphagia 02/02/2016  . GERD (gastroesophageal reflux disease) 02/02/2016  . Hyperthyroidism 01/31/2016  . 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 Elevated transaminase level 01/31/2016  . Anemia 01/31/2016  . Thrombocytopenia (HCC) 01/31/2016  . Pyuria 01/31/2016  . Weakness 01/29/2016  . HIV (human immunodeficiency virus infection) (HCC) 01/29/2016  . CAD (coronary artery disease)  01/29/2016  . COPD (chronic obstructive pulmonary disease) (HCC) 01/29/2016  . Diabetes mellitus (HCC) 01/29/2016  . Leg weakness, bilateral 01/13/2016  . Chest pain 06/24/2015    Orientation RESPIRATION BLADDER Height & Weight     Self, Time, Situation, Place  Normal Continent Weight: 176 lb 8 oz (80.1 kg) Height:  5\' 5"  (165.1 cm)  BEHAVIORAL SYMPTOMS/MOOD NEUROLOGICAL BOWEL NUTRITION STATUS   (none)  (none) Continent Diet (renal diet/carb modified)  AMBULATORY STATUS COMMUNICATION OF NEEDS Skin   Limited Assist Verbally Normal                       Personal Care Assistance Level of Assistance  Bathing, Dressing Bathing Assistance: Limited assistance   Dressing Assistance: Limited assistance     Functional Limitations Info   (none)          SPECIAL CARE FACTORS FREQUENCY  PT (By licensed PT)                    Contractures Contractures Info: Not present    Additional Factors Info  Code Status               Current Medications (06/09/2017):  This is the current hospital active medication list Current Facility-Administered Medications  Medication Dose Route Frequency Provider Last Rate Last Dose  . [START ON 06/10/2017] lamiVUDine (EPIVIR) 10 MG/ML solution 150 mg  150 mg Oral Daily Enedina Finner, MD       And  . Melene Muller ON 06/10/2017] abacavir (ZIAGEN) tablet 600 mg  600 mg Oral Daily Enedina Finner, MD  And  . [START ON 06/10/2017] dolutegravir (TIVICAY) tablet 50 mg  50 mg Oral Daily Enedina Finner, MD      . acetaminophen (TYLENOL) tablet 650 mg  650 mg Oral Q6H PRN Milagros Loll, MD       Or  . acetaminophen (TYLENOL) suppository 650 mg  650 mg Rectal Q6H PRN Sudini, Srikar, MD      . albuterol (PROVENTIL) (2.5 MG/3ML) 0.083% nebulizer solution 2.5 mg  2.5 mg Nebulization Q2H PRN Sudini, Srikar, MD      . albuterol (PROVENTIL) (2.5 MG/3ML) 0.083% nebulizer solution 2.5 mg  2.5 mg Nebulization BID Enedina Finner, MD      . amiodarone (PACERONE) tablet  400 mg  400 mg Oral Daily Milagros Loll, MD   400 mg at 06/09/17 0947  . atorvastatin (LIPITOR) tablet 10 mg  10 mg Oral QPC breakfast Milagros Loll, MD   10 mg at 06/09/17 0837  . bisacodyl (DULCOLAX) suppository 10 mg  10 mg Rectal Daily PRN Sudini, Wardell Heath, MD      . cyclobenzaprine (FLEXERIL) tablet 5 mg  5 mg Oral TID PRN Milagros Loll, MD      . escitalopram (LEXAPRO) tablet 20 mg  20 mg Oral QPC breakfast Sudini, Srikar, MD   20 mg at 06/09/17 0837  . heparin ADULT infusion 100 units/mL (25000 units/273mL sodium chloride 0.45%)  650 Units/hr Intravenous Continuous Enedina Finner, MD 6.5 mL/hr at 06/08/17 2020 650 Units/hr at 06/08/17 2020  . HYDROcodone-acetaminophen (NORCO/VICODIN) 5-325 MG per tablet 1 tablet  1 tablet Oral Q6H PRN Milagros Loll, MD   1 tablet at 06/08/17 1614  . insulin aspart (novoLOG) injection 0-15 Units  0-15 Units Subcutaneous TID WC Sudini, Srikar, MD      . insulin aspart (novoLOG) injection 0-5 Units  0-5 Units Subcutaneous QHS Sudini, Srikar, MD      . levothyroxine (SYNTHROID, LEVOTHROID) tablet 50 mcg  50 mcg Oral QAC breakfast Milagros Loll, MD   50 mcg at 06/09/17 0808  . linaclotide (LINZESS) capsule 72 mcg  72 mcg Oral QPC breakfast Milagros Loll, MD   72 mcg at 06/08/17 0935  . loperamide (IMODIUM) capsule 2 mg  2 mg Oral Q6H PRN Arnaldo Natal, MD   2 mg at 06/09/17 0558  . metoprolol succinate (TOPROL-XL) 24 hr tablet 100 mg  100 mg Oral Daily Milagros Loll, MD   100 mg at 06/09/17 0947  . mirtazapine (REMERON) tablet 15 mg  15 mg Oral QHS Milagros Loll, MD   15 mg at 06/08/17 2324  . mometasone-formoterol (DULERA) 200-5 MCG/ACT inhaler 2 puff  2 puff Inhalation BID Milagros Loll, MD   2 puff at 06/09/17 0948  . ondansetron (ZOFRAN) tablet 4 mg  4 mg Oral Q6H PRN Milagros Loll, MD       Or  . ondansetron (ZOFRAN) injection 4 mg  4 mg Intravenous Q6H PRN Sudini, Srikar, MD      . polyethylene glycol (MIRALAX / GLYCOLAX) packet 17 g  17 g Oral  Daily PRN Sudini, Wardell Heath, MD      . pregabalin (LYRICA) capsule 25 mg  25 mg Oral BID Milagros Loll, MD   25 mg at 06/09/17 0947  . sodium chloride flush (NS) 0.9 % injection 3 mL  3 mL Intravenous Q12H Milagros Loll, MD   3 mL at 06/09/17 0948  . tiotropium (SPIRIVA) inhalation capsule 18 mcg  18 mcg Inhalation Daily Milagros Loll, MD   18 mcg at 06/09/17 [phone]  Discharge Medications: Please see discharge summary for a list of discharge medications.  Relevant Imaging Results:  Relevant Lab Results:   Additional Information ss: [id number]  York SpanielMonica Jaime Dome, LCSW

## 2017-06-09 NOTE — Progress Notes (Signed)
Brian Hampton   DOB:June 02, 1956   WJ#:[id number]#:[id number]    Subjective: Patient had an uneventful night. Denies any bleeding episodes. Continues to have Foley catheter. No fever chills.  Review of system: No diarrhea.  Objective:  Vitals:   06/09/17 1251 06/09/17 2126  BP: (!) 105/58 101/65  Pulse: 65 60  Resp: 18 18  Temp: 98.1 F (36.7 C) 98.7 F (37.1 C)  SpO2: 99% 98%     Intake/Output Summary (Last 24 hours) at 06/09/17 2231 Last data filed at 06/09/17 1856  Gross per 24 hour  Intake          2624.39 ml  Output             2525 ml  Net            99.39 ml    GENERAL: Well-nourished well-developed; Alert, no distress and comfortable.   Alone. Multiple ecchymosis. EYES: no pallor or icterus OROPHARYNX: no thrush or ulceration. NECK: supple, no masses felt LYMPH:  no palpable lymphadenopathy in the cervical, axillary or inguinal regions LUNGS: decreased breath sounds to auscultation at bases and  No wheeze or crackles HEART/CVS: regular rate & rhythm and no murmurs; bilateral lower extremity swelling right more than left.  ABDOMEN: abdomen soft, non-tender and normal bowel sounds; positive for Foley catheter. Musculoskeletal:no cyanosis of digits and no clubbing  PSYCH: alert & oriented x 3 with fluent speech NEURO: no focal motor/sensory deficits SKIN:  Multiple ecchymosis.   Labs:  Lab Results  Component Value Date   WBC 6.3 06/09/2017   HGB 8.2 (L) 06/09/2017   HCT 24.7 (L) 06/09/2017   MCV 102.7 (H) 06/09/2017   PLT 161 06/09/2017   NEUTROABS 4.3 06/07/2017    Lab Results  Component Value Date   NA 145 06/08/2017   K 3.8 06/08/2017   CL 114 (H) 06/08/2017   CO2 25 06/08/2017    Studies:  No results found.  Assessment & Plan:  #  61 year old male patient with multiple medical problems- history of multiple DVT/PE-most recently on Arixtra is currently admitted to hospital for acute renal failure/difficulty urination.  # History of multiple recurrent DVT/PE-  most recently started on Arixtra [given previous thrombotic events on NOACs/lovenox]- ultrasound of the right lower extremity shows- acute DVT/incomplete obstruction and proximal femoral vein. Given the acute renal failure and would recommend continued IV heparin; and resuming Arixtra when renal function is at baseline.  # Difficulty with urination- question bladder outlet obstruction status post Foley catheter.   # Acute renal failure-creatinine improving at 2.48.   #  HIV/ mild worsening of chronic macrocytic anemia-monitor for now. Check B12 folic acid.   Earna CoderGovinda R Brahmanday, MD 06/09/2017  10:31 PM

## 2017-06-09 NOTE — Progress Notes (Signed)
ANTICOAGULATION CONSULT NOTE - Initial Consult  Pharmacy Consult for heparin  Indication: VTE treatment  Allergies  Allergen Reactions  . Aspirin Anaphylaxis  . Bee Venom Anaphylaxis  . Penicillins Anaphylaxis and Other (See Comments)    Has patient had a PCN reaction causing immediate rash, facial/tongue/throat swelling, SOB or lightheadedness with hypotension: Yes Has patient had a PCN reaction causing severe rash involving mucus membranes or skin necrosis: No Has patient had a PCN reaction that required hospitalization No Has patient had a PCN reaction occurring within the last 10 years: Yes If all of the above answers are "NO", then may proceed with Cephalosporin use.  . Prednisone Anaphylaxis  . Sulfa Antibiotics Anaphylaxis  . Sulfasalazine Anaphylaxis  . Theophyllines Anaphylaxis  . Theophylline Swelling    Patient Measurements: Height: 5\' 5"  (165.1 cm) Weight: 176 lb 8 oz (80.1 kg) IBW/kg (Calculated) : 61.5 Heparin Dosing Weight: 78.3kg  Vital Signs: Temp: 98.1 F (36.7 C) (11/01 1251) Temp Source: Oral (11/01 1251) BP: 105/58 (11/01 1251) Pulse Rate: 65 (11/01 1251)  Labs:  Recent Labs  06/07/17 1234  06/07/17 1416  06/07/17 1423 06/08/17 0038 06/08/17 1126 06/08/17 1919 06/09/17 0248 06/09/17 1012  HGB  --   < > 10.7*  --   --  8.6*  --   --  8.2*  --   HCT  --   --  33.0*  --   --  26.9*  --   --  24.7*  --   PLT  --   --  197  --   --  152  --   --  161  --   APTT  --   --   --   < > 35  --  >160* 114* 86* 73*  LABPROT  --   --   --   --  17.3*  --   --   --   --   --   INR  --   --   --   --  1.43  --   --   --   --   --   HEPARINUNFRC  --   --   --   < > 2.34* 3.12* 1.86* 1.35*  --  0.72*  CREATININE  --   --  5.27*  --   --  4.31*  --   --  2.48*  --   CKTOTAL  --   --  113  --   --   --   --   --   --   --   TROPONINI <0.03  --   --   --   --   --   --   --   --   --   < > = values in this interval not displayed.  Estimated Creatinine  Clearance: 30.9 mL/min (A) (by C-G formula based on SCr of 2.48 mg/dL (H)).   Medical History: Past Medical History:  Diagnosis Date  . Anemia   . Asthma   . Cataracts, bilateral    worse in Rt eye  . Collagen vascular disease (HCC)   . COPD (chronic obstructive pulmonary disease) (HCC)   . Coronary artery disease   . Depression   . DVT (deep venous thrombosis) (HCC)   . Dysrhythmia   . Emphysema/COPD (HCC)   . Environmental and seasonal allergies   . Family history of adverse reaction to anesthesia    sister PONV  . GERD (gastroesophageal reflux disease)   .  H/O blood clots   . Heart murmur   . HIV (human immunodeficiency virus infection) (HCC)   . Hypertension   . Leaky heart valve    x 3  . Lung mass   . Myocardial infarction (HCC)    in 2000  . Pneumonia    year ago  . Pulmonary emboli (HCC)   . Type 2 diabetes mellitus (HCC)     Medications:  Infusions:  . heparin 650 Units/hr (06/08/17 2020)    Assessment: 61 yom cc dizziness with PMH DM, COPD, DVT and PE with IVC on Arixtra, CKD, avascular necrosis  Goal of Therapy:  Heparin level 0.3-0.7 units/ml Monitor platelets by anticoagulation protocol: Yes   Plan:  APTT therapeutic. Will check both HL and APPT in AM. Plan is to most likely switch pt back to fonduparenox tomorrow as long as renal function continues to improve.  Olene FlossMelissa D Kinslei Labine, Pharm.D, BCPS Clinical Pharmacist  06/09/17 12:57 PM

## 2017-06-09 NOTE — Telephone Encounter (Signed)
-----   Message from Bjorn PippinJohn Wrenn, MD sent at 06/08/2017  4:10 PM EDT ----- This patient has retention with a foley and needs to be seen for a voiding trial and consult next week.   I didn't see him in consultation.

## 2017-06-09 NOTE — Evaluation (Signed)
Physical Therapy Evaluation Patient Details Name: Brian Hampton MRN: [id number] DOB: March 06, 1956 Today's Date: 06/09/2017   History of Present Illness  61 y.o. male with a known history of anemia, bronchial asthma, collagen vascular disease, COPD, coronary artery disease, recurrent DVT, hypertension, HIV infection, PE.  He was here in September with multiple falls with multiple rib fractures was at rehab for a while and is now here with kidney issues and fluid retention.  Pt with L hip AVN and is eager to have both hips replaced as soon as possible.  Clinical Impression  Pt is able to get up to EOB and standing w/o direct assist.  He did well walking ~20 ft when we realized he had had a bowel movement and we needed to return to the bed.  He reported having his chronic hip pain as a limiter, but ultimately was safe with limited in home ambulation and mobility.  Pt showed good effort and though he feels he needs multiple more days in the hospital "to get the fluid off" he feels able to go home with HHPT with a walker.    Follow Up Recommendations Home health PT    Equipment Recommendations  Rolling walker with 5" wheels    Recommendations for Other Services       Precautions / Restrictions Precautions Precautions: Fall Restrictions Weight Bearing Restrictions: No      Mobility  Bed Mobility Overal bed mobility: Modified Independent             General bed mobility comments: Pt needed rails, but did not need direct assist to get to sitting  Transfers Overall transfer level: Modified independent Equipment used: Rolling walker (2 wheeled)             General transfer comment: Pt able to rise w/o direct assist, cuing for hand placement and sequencing  Ambulation/Gait Ambulation/Gait assistance: Modified independent (Device/Increase time) Ambulation Distance (Feet): 35 Feet Assistive device: Rolling walker (2 wheeled)          Stairs            Wheelchair  Mobility    Modified Rankin (Stroke Patients Only)       Balance Overall balance assessment: Modified Independent                                           Pertinent Vitals/Pain Pain Assessment:  (reports chronic L>R hip pain, 5/10 with WBing)    Home Living Family/patient expects to be discharged to:: Private residence Living Arrangements: Other relatives (staying with sister) Available Help at Discharge: Personal care attendant Type of Home: House Home Access: Ramped entrance     Home Layout: Multi-level;Able to live on main level with bedroom/bathroom        Prior Function Level of Independence: Needs assistance   Gait / Transfers Assistance Needed: uses cane, has had multiple falls           Hand Dominance        Extremity/Trunk Assessment   Upper Extremity Assessment Upper Extremity Assessment: Overall WFL for tasks assessed;Generalized weakness    Lower Extremity Assessment Lower Extremity Assessment: Generalized weakness (pain limited, grossly 3+/5)       Communication   Communication: No difficulties  Cognition Arousal/Alertness: Awake/alert Behavior During Therapy: WFL for tasks assessed/performed Overall Cognitive Status: Within Functional Limits for tasks assessed  General Comments      Exercises     Assessment/Plan    PT Assessment Patient needs continued PT services  PT Problem List Decreased strength;Decreased range of motion;Decreased activity tolerance;Decreased mobility;Decreased balance;Decreased coordination;Decreased safety awareness;Pain;Cardiopulmonary status limiting activity;Decreased knowledge of use of DME       PT Treatment Interventions DME instruction;Gait training;Stair training;Functional mobility training;Therapeutic activities;Therapeutic exercise;Balance training;Patient/family education;Neuromuscular re-education    PT Goals (Current  goals can be found in the Care Plan section)  Acute Rehab PT Goals Patient Stated Goal: get his hips replaced PT Goal Formulation: With patient Time For Goal Achievement: 06/23/17 Potential to Achieve Goals: Fair    Frequency Min 2X/week   Barriers to discharge        Co-evaluation               AM-PAC PT "6 Clicks" Daily Activity  Outcome Measure Difficulty turning over in bed (including adjusting bedclothes, sheets and blankets)?: None Difficulty moving from lying on back to sitting on the side of the bed? : A Little Difficulty sitting down on and standing up from a chair with arms (e.g., wheelchair, bedside commode, etc,.)?: A Little Help needed moving to and from a bed to chair (including a wheelchair)?: None Help needed walking in hospital room?: A Little Help needed climbing 3-5 steps with a railing? : A Little 6 Click Score: 20    End of Session Equipment Utilized During Treatment: Gait belt Activity Tolerance: Patient limited by fatigue;Patient limited by pain Patient left: with bed alarm set;with call bell/phone within reach   PT Visit Diagnosis: Muscle weakness (generalized) (M62.81);Difficulty in walking, not elsewhere classified (R26.2)    Time: 1610-9604 PT Time Calculation (min) (ACUTE ONLY): 22 min   Charges:   PT Evaluation $PT Eval Low Complexity: 1 Low     PT G Codes:   PT G-Codes **NOT FOR INPATIENT CLASS** Functional Assessment Tool Used: AM-PAC 6 Clicks Basic Mobility Functional Limitation: Mobility: Walking and moving around Mobility: Walking and Moving Around Current Status (V4098): At least 20 percent but less than 40 percent impaired, limited or restricted Mobility: Walking and Moving Around Goal Status [phone]): At least 1 percent but less than 20 percent impaired, limited or restricted    Malachi Pro, DPT 06/09/2017, 4:54 PM

## 2017-06-09 NOTE — Progress Notes (Signed)
[facility]  ROUNDING NOTE   Subjective:  Renal function continues to improve rapidly. Creatinine down to 2.48. Overall patient appears to be doing better.  Objective:  Vital signs in last 24 hours:  Temp:  [97.8 F (36.6 C)-98.1 F (36.7 C)] 98.1 F (36.7 C) (11/01 1251) Pulse Rate:  [64-67] 65 (11/01 1251) Resp:  [18-20] 18 (11/01 1251) BP: (101-107)/(57-59) 105/58 (11/01 1251) SpO2:  [97 %-100 %] 99 % (11/01 1251) Weight:  [80.1 kg (176 lb 8 oz)] 80.1 kg (176 lb 8 oz) (11/01 0500)  Weight change: -1.588 kg (-3 lb 8 oz) Filed Weights   06/07/17 1233 06/08/17 0341 06/09/17 0500  Weight: 81.6 kg (180 lb) 82.2 kg (181 lb 4.8 oz) 80.1 kg (176 lb 8 oz)    Intake/Output: I/O last 3 completed shifts: In: 4037.7 [P.O.:240; I.V.:3797.7] Out: 2675 [Urine:2675]   Intake/Output this shift:  Total I/O In: 480 [P.O.:480] Out: 1300 [Urine:1300]  Physical Exam: General: No acute distress  Head: Normocephalic, atraumatic. Moist oral mucosal membranes  Eyes: Anicteric  Neck: Supple, trachea midline  Lungs:  Clear to auscultation, normal effort  Heart: S1S2 no rubs  Abdomen:  Soft, nontender, bowel sounds present  Extremities: Trace peripheral edema.  Neurologic: Awake, alert, following commands  Skin: No lesions  GU: Foley in place    Basic Metabolic Panel:  Recent Labs Lab 06/07/17 1416 06/08/17 0038 06/09/17 0248  NA 142 145  --   K 5.4* 3.8  --   CL 111 114*  --   CO2 19* 25  --   GLUCOSE 99 108*  --   BUN 64* 57*  --   CREATININE 5.27* 4.31* 2.48*  CALCIUM 8.4* 8.0*  --     Liver Function Tests:  Recent Labs Lab 06/07/17 1416  AST 41  ALT 20  ALKPHOS 239*  BILITOT 0.9  PROT 7.5  ALBUMIN 2.3*   No results for input(s): LIPASE, AMYLASE in the last 168 hours.  Recent Labs Lab 06/07/17 1420  AMMONIA 10    CBC:  Recent Labs Lab 06/07/17 1416 06/08/17 0038 06/09/17 0248  WBC 6.4 4.8 6.3  NEUTROABS 4.3  --   --   HGB 10.7*  8.6* 8.2*  HCT 33.0* 26.9* 24.7*  MCV 104.3* 102.8* 102.7*  PLT 197 152 161    Cardiac Enzymes:  Recent Labs Lab 06/07/17 1234 06/07/17 1416  CKTOTAL  --  113  TROPONINI <0.03  --     BNP: Invalid input(s): POCBNP  CBG:  Recent Labs Lab 06/08/17 1652 06/08/17 2131 06/09/17 0058 06/09/17 0740 06/09/17 1147  GLUCAP 88 84 124* 76 84    Microbiology: Results for orders placed or performed during the hospital encounter of 05/30/17  Urine culture     Status: Abnormal   Collection Time: 05/30/17  3:06 PM  Result Value Ref Range Status   Specimen Description URINE, RANDOM  Final   Special Requests NONE  Final   Culture (A)  Final    <10,000 COLONIES/mL Performed at [facility], [address], [place], [place] [address]    Report Status 05/31/2017 FINAL  Final  Surgical pcr screen     Status: None   Collection Time: 05/30/17  3:06 PM  Result Value Ref Range Status   MRSA, PCR NEGATIVE NEGATIVE Final   Staphylococcus aureus NEGATIVE NEGATIVE Final    Comment: (NOTE) The Xpert SA Assay (FDA approved for NASAL specimens in patients 18 years of age and older), is  one component of a comprehensive surveillance program. It is not intended to diagnose infection nor to guide or monitor treatment.     Coagulation Studies:  Recent Labs  06/07/17 1423  LABPROT 17.3*  INR 1.43    Urinalysis:  Recent Labs  06/07/17 1716  COLORURINE YELLOW*  LABSPEC 1.005  PHURINE 6.0  GLUCOSEU NEGATIVE  HGBUR NEGATIVE  BILIRUBINUR NEGATIVE  KETONESUR NEGATIVE  PROTEINUR NEGATIVE  NITRITE NEGATIVE  LEUKOCYTESUR NEGATIVE      Imaging: Koreas Renal  Result Date: 06/07/2017 CLINICAL DATA:  Acute renal insufficiency EXAM: RENAL / URINARY TRACT ULTRASOUND COMPLETE COMPARISON:  CT abdomen and pelvis May 02, 2017 FINDINGS: Right Kidney: Length: 9.5 cm. Echogenicity and renal cortical thickness are within normal limits. No mass or perinephric fluid visualized. There is  mild fullness of the right renal collecting system and proximal ureter which persists after voiding. No sonographically demonstrable calculus evident. Mid and distal portions of ureter not visualized. Left Kidney: Length: 10.3 cm. Echogenicity and renal cortical thickness are within normal limits. No mass or perinephric fluid visualized. There is mild fullness of the left renal collecting system and proximal ureter which persists after voiding. No sonographically demonstrable calculus evident. Mid and distal portions of ureter not visualized. Bladder: Appears normal for degree of bladder distention. Urinary bladder is diffusely distended. Prevoid urinary bladder volume is measured at 928 mL. Postvoid residual measures 890 mL. IMPRESSION: Urinary bladder distention with large postvoid residual. This finding raises concern for urinary bladder outlet obstruction. Fullness of each renal collecting system is noted with persistence after attempted voiding, likely due to stasis phenomenon from the urinary bladder distention. Study otherwise unremarkable. Electronically Signed   By: Bretta BangWilliam  Woodruff III M.D.   On: 06/07/2017 16:51   [place] Venous Img Lower Bilateral  Result Date: 06/07/2017 CLINICAL DATA:  Bilateral lower extremity edema EXAM: BILATERAL LOWER EXTREMITY VENOUS DUPLEX ULTRASOUND TECHNIQUE: Gray-scale sonography with graded compression, as well as color Doppler and duplex ultrasound were performed to evaluate the lower extremity deep venous systems from the level of the common femoral vein and including the common femoral, femoral, profunda femoral, popliteal and calf veins including the posterior tibial, peroneal and gastrocnemius veins when visible. The superficial great saphenous vein was also interrogated. Spectral Doppler was utilized to evaluate flow at rest and with distal augmentation maneuvers in the common femoral, femoral and popliteal veins. COMPARISON:  None. FINDINGS: RIGHT LOWER EXTREMITY  Common Femoral Vein: No evidence of thrombus. Normal compressibility, respiratory phasicity and response to augmentation. Saphenofemoral Junction: No evidence of thrombus. Normal compressibility and flow on color Doppler imaging. Profunda Femoral Vein: No evidence of thrombus. Normal compressibility and flow on color Doppler imaging. Femoral Vein: There is hypoechoic thrombus throughout much of the proximal right femoral vein which is partially compressible with limited Doppler flow. There is felt to be completely obstructing acute deep venous thrombosis in this region. Popliteal Vein: No evidence of thrombus. Normal compressibility, respiratory phasicity and response to augmentation. Calf Veins: No evidence of thrombus. Normal compressibility and flow on color Doppler imaging. Superficial Great Saphenous Vein: No evidence of thrombus. Normal compressibility. Venous Reflux:  None. Other Findings:  None. LEFT LOWER EXTREMITY Common Femoral Vein: No evidence of thrombus. Normal compressibility, respiratory phasicity and response to augmentation. Saphenofemoral Junction: No evidence of thrombus. Normal compressibility and flow on color Doppler imaging. Profunda Femoral Vein: No evidence of thrombus. Normal compressibility and flow on color Doppler imaging. Femoral Vein: No evidence of thrombus. Normal compressibility, respiratory phasicity and  response to augmentation. Popliteal Vein: No evidence of thrombus. Normal compressibility, respiratory phasicity and response to augmentation. Calf Veins: No evidence of thrombus. Normal compressibility and flow on color Doppler imaging. Superficial Great Saphenous Vein: No evidence of thrombus. Normal compressibility. Venous Reflux:  None. Other Findings:  None. IMPRESSION: Evidence of acute deep venous thrombosis, incompletely obstructing, in portions of the proximal right femoral vein. No other foci of deep venous thrombosis in either lower extremity. Electronically Signed    By: Bretta Bang III M.D.   On: 06/07/2017 17:03     Medications:   . heparin 650 Units/hr (06/08/17 2020)   . [START ON 06/10/2017] lamiVUDine  150 mg Oral Daily   And  . [START ON 06/10/2017] abacavir  600 mg Oral Daily   And  . [START ON 06/10/2017] dolutegravir  50 mg Oral Daily  . albuterol  2.5 mg Nebulization BID  . amiodarone  400 mg Oral Daily  . atorvastatin  10 mg Oral QPC breakfast  . escitalopram  20 mg Oral QPC breakfast  . insulin aspart  0-15 Units Subcutaneous TID WC  . insulin aspart  0-5 Units Subcutaneous QHS  . levothyroxine  50 mcg Oral QAC breakfast  . linaclotide  72 mcg Oral QPC breakfast  . metoprolol succinate  100 mg Oral Daily  . mirtazapine  15 mg Oral QHS  . mometasone-formoterol  2 puff Inhalation BID  . pregabalin  25 mg Oral BID  . sodium chloride flush  3 mL Intravenous Q12H  . tiotropium  18 mcg Inhalation Daily   acetaminophen **OR** acetaminophen, albuterol, bisacodyl, cyclobenzaprine, HYDROcodone-acetaminophen, loperamide, ondansetron **OR** ondansetron (ZOFRAN) IV, polyethylene glycol  Assessment/ Plan:  61 y.o. male with past medical history of COPD, coronary artery disease, GERD, HIV, hypertension, history of pulmonary embolism, diabetes mellitus type 2, depression who presented with difficulties with urination.  1. Acute renal failure secondary to bladder outlet obstruction. 2. Bladder outlet obstruction. 3. Chronic kidney disease stage II. 4. Hypertension. 5. Anemia of chronic kidney disease.  Plan: Renal function improved rapidly with Foley decompression of the bladder.  Therefore we will continue this for now.  Urology advise the patient regarding urology followup in the office for voiding trial.  Continue to monitor renal function daily for now.  Recommend nephrology followup in one to 2 weeks post discharge as well.   LOS: 2 Jep Dyas 11/1/20183:28 PM

## 2017-06-09 NOTE — Progress Notes (Signed)
[facility] -  at [facility]   PATIENT NAME: Brian Hampton    MR#:  [id number]  DATE OF BIRTH:  [birth year]  SUBJECTIVE:  Feels a lot better today.  Foley catheter draining well.  Eating okay.  REVIEW OF SYSTEMS:   Review of Systems  Constitutional: Negative for chills, fever and weight loss.  HENT: Negative for ear discharge, ear pain and nosebleeds.   Eyes: Negative for blurred vision, pain and discharge.  Respiratory: Negative for sputum production, shortness of breath, wheezing and stridor.   Cardiovascular: Negative for chest pain, palpitations, orthopnea and PND.  Gastrointestinal: Negative for abdominal pain, diarrhea, nausea and vomiting.  Genitourinary: Negative for frequency and urgency.  Musculoskeletal: Negative for back pain and joint pain.  Neurological: Positive for weakness. Negative for sensory change, speech change and focal weakness.  Psychiatric/Behavioral: Negative for depression and hallucinations. The patient is not nervous/anxious.    Tolerating Diet: Yes Tolerating PT: Home health  DRUG ALLERGIES:   Allergies  Allergen Reactions  . Aspirin Anaphylaxis  . Bee Venom Anaphylaxis  . Penicillins Anaphylaxis and Other (See Comments)    Has patient had a PCN reaction causing immediate rash, facial/tongue/throat swelling, SOB or lightheadedness with hypotension: Yes Has patient had a PCN reaction causing severe rash involving mucus membranes or skin necrosis: No Has patient had a PCN reaction that required hospitalization No Has patient had a PCN reaction occurring within the last 10 years: Yes If all of the above answers are "NO", then may proceed with Cephalosporin use.  . Prednisone Anaphylaxis  . Sulfa Antibiotics Anaphylaxis  . Sulfasalazine Anaphylaxis  . Theophyllines Anaphylaxis  . Theophylline Swelling    VITALS:  Blood pressure (!) 105/58, pulse 65, temperature 98.1 F (36.7 C), temperature source Oral, resp. rate  18, height 5\' 5"  (1.651 m), weight 80.1 kg (176 lb 8 oz), SpO2 99 %.  PHYSICAL EXAMINATION:   Physical Exam  GENERAL:  61 y.o.-year-old patient lying in the bed with no acute distress.  Chronically ill EYES: Pupils equal, round, reactive to light and accommodation. No scleral icterus. Extraocular muscles intact.  HEENT: Head atraumatic, normocephalic. Oropharynx and nasopharynx clear.  NECK:  Supple, no jugular venous distention. No thyroid enlargement, no tenderness.  LUNGS: Normal breath sounds bilaterally, no wheezing, rales, rhonchi. No use of accessory muscles of respiration.  CARDIOVASCULAR: S1, S2 normal. No murmurs, rubs, or gallops.  ABDOMEN: Soft, nontender, nondistended. Bowel sounds present. No organomegaly or mass.  Foley catheter EXTREMITIES: No cyanosis, clubbing or edema b/l.    NEUROLOGIC: Cranial nerves II through XII are intact. No focal Motor or sensory deficits b/l.   PSYCHIATRIC:  patient is alert and oriented x 3.  SKIN: No obvious rash, lesion, or ulcer.   LABORATORY PANEL:  CBC  Recent Labs Lab 06/09/17 0248  WBC 6.3  HGB 8.2*  HCT 24.7*  PLT 161    Chemistries   Recent Labs Lab 06/07/17 1416 06/08/17 0038 06/09/17 0248  NA 142 145  --   K 5.4* 3.8  --   CL 111 114*  --   CO2 19* 25  --   GLUCOSE 99 108*  --   BUN 64* 57*  --   CREATININE 5.27* 4.31* 2.48*  CALCIUM 8.4* 8.0*  --   AST 41  --   --   ALT 20  --   --   ALKPHOS 239*  --   --   BILITOT 0.9  --   --  Cardiac Enzymes  Recent Labs Lab 06/07/17 1234  TROPONINI <0.03   RADIOLOGY:  No results found. ASSESSMENT AND PLAN:  Desiree Haneommy Vassey  is a 61 y.o. 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  Baseline creatinine is 1.2 and today is 5.5 with potassium of 5.7.  The patient has  noticed some blood in his urine  * AKI over CKD3--due to obstructive uropathy -Patient has baseline creatinine of 1.4 -ultrasound renal shows--bladder outlet obstruction - urology consultation with Dr.Wren appreciated--recommends patient be discharged with Foley catheter and follow-up as outpatient for further evaluation -Patient came in with creatinine of 5.27 --- 4.31 --2.28 -  Stop meloxicam.  Associated mild hyperkalemia.  Stop potassium supplements. Avoid nephrotoxic medications.   *Bilateral lower extremity edema.  He does have history of DVT with IVC filter in place.  Patient has history of factor V Leyden deficiency--heterozygous - On Arixtra at home.   -Ultrasound Doppler shows Evidence of acute deep venous thrombosis, incompletely obstructing, in portions of the proximal right femoral vein -  Will change Arixtra to heparin drip.  - Patient will need to be on Arixtra after creatinine improves.  This was discussed with Dr. Guy SandiferBramanday  *Diabetes mellitus.  Start sliding scale insulin.   -Diabetic diet  * HIV Continue home meds  *Generalized weakness both lower extremities. Patient states he has not walked in the last 2 weeks. He has been having frequent falls at home.  -Physical therapy to see patient   Social worker for discharge planning  Case discussed with Care Management/Social Worker. Management plans discussed with the patient, family and they are in agreement.  CODE STATUS: DO NOT RESUSCITATE  DVT Prophylaxis: IV heparin drip   TOTAL TIME TAKING CARE OF THIS PATIENT: *25* minutes.  >50% time spent on counselling and coordination of care  POSSIBLE D/C IN 1-2 DAYS, DEPENDING ON CLINICAL CONDITION.  Note: This dictation was prepared with Dragon dictation along with smaller phrase technology. Any transcriptional errors that result from this process are unintentional.  Arwin Bisceglia M.D on 06/09/2017 at 5:30 PM  Between 7am to 6pm - Pager - [phone]  After  6pm go to [email or web address] - password [email or web address]  [facility]  Office  [phone]  CC: Primary care physician; Lyndon CodeKhan, Fozia M, MD

## 2017-06-10 ENCOUNTER — Other Ambulatory Visit: Payer: Self-pay

## 2017-06-10 ENCOUNTER — Encounter: Payer: Self-pay | Admitting: Emergency Medicine

## 2017-06-10 ENCOUNTER — Observation Stay
Admission: EM | Admit: 2017-06-10 | Discharge: 2017-06-13 | Disposition: A | Discharge status: Home / Self Care | Payer: Medicare Other | Attending: Internal Medicine | Admitting: Internal Medicine

## 2017-06-10 DIAGNOSIS — Z888 Allergy status to other drugs, medicaments and biological substances status: Secondary | ICD-10-CM | POA: Insufficient documentation

## 2017-06-10 DIAGNOSIS — Z7901 Long term (current) use of anticoagulants: Secondary | ICD-10-CM | POA: Insufficient documentation

## 2017-06-10 DIAGNOSIS — J449 Chronic obstructive pulmonary disease, unspecified: Secondary | ICD-10-CM | POA: Diagnosis not present

## 2017-06-10 DIAGNOSIS — Z66 Do not resuscitate: Secondary | ICD-10-CM | POA: Insufficient documentation

## 2017-06-10 DIAGNOSIS — E039 Hypothyroidism, unspecified: Secondary | ICD-10-CM | POA: Insufficient documentation

## 2017-06-10 DIAGNOSIS — N320 Bladder-neck obstruction: Secondary | ICD-10-CM | POA: Insufficient documentation

## 2017-06-10 DIAGNOSIS — F329 Major depressive disorder, single episode, unspecified: Secondary | ICD-10-CM | POA: Insufficient documentation

## 2017-06-10 DIAGNOSIS — Z87891 Personal history of nicotine dependence: Secondary | ICD-10-CM | POA: Insufficient documentation

## 2017-06-10 DIAGNOSIS — I129 Hypertensive chronic kidney disease with stage 1 through stage 4 chronic kidney disease, or unspecified chronic kidney disease: Secondary | ICD-10-CM | POA: Insufficient documentation

## 2017-06-10 DIAGNOSIS — I82411 Acute embolism and thrombosis of right femoral vein: Secondary | ICD-10-CM | POA: Diagnosis not present

## 2017-06-10 DIAGNOSIS — Z882 Allergy status to sulfonamides status: Secondary | ICD-10-CM | POA: Insufficient documentation

## 2017-06-10 DIAGNOSIS — I252 Old myocardial infarction: Secondary | ICD-10-CM | POA: Insufficient documentation

## 2017-06-10 DIAGNOSIS — B20 Human immunodeficiency virus [HIV] disease: Secondary | ICD-10-CM | POA: Insufficient documentation

## 2017-06-10 DIAGNOSIS — K920 Hematemesis: Secondary | ICD-10-CM | POA: Diagnosis not present

## 2017-06-10 DIAGNOSIS — K219 Gastro-esophageal reflux disease without esophagitis: Secondary | ICD-10-CM | POA: Insufficient documentation

## 2017-06-10 DIAGNOSIS — Z91030 Bee allergy status: Secondary | ICD-10-CM | POA: Insufficient documentation

## 2017-06-10 DIAGNOSIS — Z79899 Other long term (current) drug therapy: Secondary | ICD-10-CM | POA: Insufficient documentation

## 2017-06-10 DIAGNOSIS — N183 Chronic kidney disease, stage 3 (moderate): Secondary | ICD-10-CM | POA: Insufficient documentation

## 2017-06-10 DIAGNOSIS — R197 Diarrhea, unspecified: Secondary | ICD-10-CM | POA: Insufficient documentation

## 2017-06-10 DIAGNOSIS — K7460 Unspecified cirrhosis of liver: Secondary | ICD-10-CM | POA: Insufficient documentation

## 2017-06-10 DIAGNOSIS — Z880 Allergy status to penicillin: Secondary | ICD-10-CM | POA: Insufficient documentation

## 2017-06-10 DIAGNOSIS — I2510 Atherosclerotic heart disease of native coronary artery without angina pectoris: Secondary | ICD-10-CM | POA: Insufficient documentation

## 2017-06-10 DIAGNOSIS — E1122 Type 2 diabetes mellitus with diabetic chronic kidney disease: Secondary | ICD-10-CM | POA: Insufficient documentation

## 2017-06-10 DIAGNOSIS — Z86711 Personal history of pulmonary embolism: Secondary | ICD-10-CM | POA: Insufficient documentation

## 2017-06-10 LAB — COMPREHENSIVE METABOLIC PANEL
ALBUMIN: 2.1 g/dL — AB (ref 3.5–5.0)
ALK PHOS: 200 U/L — AB (ref 38–126)
ALT: 25 U/L (ref 17–63)
ANION GAP: 10 (ref 5–15)
AST: 35 U/L (ref 15–41)
BILIRUBIN TOTAL: 0.7 mg/dL (ref 0.3–1.2)
BUN: 22 mg/dL — AB (ref 6–20)
CALCIUM: 8.4 mg/dL — AB (ref 8.9–10.3)
CO2: 21 mmol/L — ABNORMAL LOW (ref 22–32)
CREATININE: 1.76 mg/dL — AB (ref 0.61–1.24)
Chloride: 109 mmol/L (ref 101–111)
GFR calc Af Amer: 47 mL/min — ABNORMAL LOW (ref >60)
GFR calc non Af Amer: 40 mL/min — ABNORMAL LOW (ref >60)
GLUCOSE: 114 mg/dL — AB (ref 65–99)
Potassium: 3.6 mmol/L (ref 3.5–5.1)
Sodium: 140 mmol/L (ref 135–145)
TOTAL PROTEIN: 6.8 g/dL (ref 6.5–8.1)

## 2017-06-10 LAB — CBC
HCT: 25.5 % — ABNORMAL LOW (ref 40.0–52.0)
HCT: 32.4 % — ABNORMAL LOW (ref 40.0–52.0)
Hemoglobin: 8.5 g/dL — ABNORMAL LOW (ref 13.0–18.0)
Hemoglobin: 10.6 g/dL — ABNORMAL LOW (ref 13.0–18.0)
MCH: 34.2 pg — AB (ref 26.0–34.0)
MCH: 33.9 pg (ref 26.0–34.0)
MCHC: 33.2 g/dL (ref 32.0–36.0)
MCHC: 32.7 g/dL (ref 32.0–36.0)
MCV: 103.1 fL — ABNORMAL HIGH (ref 80.0–100.0)
MCV: 103.7 fL — ABNORMAL HIGH (ref 80.0–100.0)
PLATELETS: 147 10*3/uL — AB (ref 150–440)
PLATELETS: 191 10*3/uL (ref 150–440)
RBC: 2.48 MIL/uL — AB (ref 4.40–5.90)
RBC: 3.13 MIL/uL — ABNORMAL LOW (ref 4.40–5.90)
RDW: 17.3 % — ABNORMAL HIGH (ref 11.5–14.5)
RDW: 17.7 % — AB (ref 11.5–14.5)
WBC: 4.8 10*3/uL (ref 3.8–10.6)
WBC: 5.3 10*3/uL (ref 3.8–10.6)

## 2017-06-10 LAB — CREATININE, SERUM
Creatinine, Ser: 1.82 mg/dL — ABNORMAL HIGH (ref 0.61–1.24)
GFR calc non Af Amer: 39 mL/min — ABNORMAL LOW (ref >60)
GFR, EST AFRICAN AMERICAN: 45 mL/min — AB (ref >60)

## 2017-06-10 LAB — HEPARIN LEVEL (UNFRACTIONATED): HEPARIN UNFRACTIONATED: 0.36 [IU]/mL (ref 0.30–0.70)

## 2017-06-10 LAB — TROPONIN I

## 2017-06-10 LAB — GLUCOSE, CAPILLARY
GLUCOSE-CAPILLARY: 77 mg/dL (ref 65–99)
GLUCOSE-CAPILLARY: 107 mg/dL — AB (ref 65–99)
Glucose-Capillary: 83 mg/dL (ref 65–99)

## 2017-06-10 LAB — APTT
APTT: 44 s — AB (ref 24–36)
aPTT: 60 seconds — ABNORMAL HIGH (ref 24–36)

## 2017-06-10 LAB — FOLATE: FOLATE: 8.8 ng/mL (ref >5.9)

## 2017-06-10 LAB — LIPASE, BLOOD: Lipase: 19 U/L (ref 11–51)

## 2017-06-10 LAB — VITAMIN B12: VITAMIN B 12: 211 pg/mL (ref 180–914)

## 2017-06-10 MED ORDER — OCTREOTIDE LOAD VIA INFUSION
50.0000 ug | Freq: Once | INTRAVENOUS | Status: DC
  Filled 2017-06-10 (×2): qty 25

## 2017-06-10 MED ORDER — MIRTAZAPINE 15 MG PO TABS: 15.0000 mg | ORAL_TABLET | Freq: Every day | ORAL | 0 refills | Status: AC

## 2017-06-10 MED ORDER — FONDAPARINUX SODIUM 7.5 MG/0.6ML ~~LOC~~ SOLN
7.5000 mg | Freq: Every day | SUBCUTANEOUS | Status: DC
  Filled 2017-06-10: qty 0.6

## 2017-06-10 MED ORDER — AMIODARONE HCL 400 MG PO TABS: 400.0000 mg | ORAL_TABLET | Freq: Every day | ORAL | 0 refills | Status: AC

## 2017-06-10 MED ORDER — CETIRIZINE HCL 10 MG PO TABS: 10.0000 mg | ORAL_TABLET | Freq: Every day | ORAL | 0 refills | Status: AC | PRN

## 2017-06-10 MED ORDER — MOMETASONE FURO-FORMOTEROL FUM 200-5 MCG/ACT IN AERO: 2.0000 | INHALATION_SPRAY | Freq: Two times a day (BID) | RESPIRATORY_TRACT | 1 refills | Status: AC

## 2017-06-10 MED ORDER — MELOXICAM 7.5 MG PO TABS: 7.5000 mg | ORAL_TABLET | Freq: Every day | ORAL | 0 refills | Status: AC | PRN

## 2017-06-10 MED ORDER — MAGNESIUM OXIDE -MG SUPPLEMENT 500 MG PO TABS: 500.0000 mg | ORAL_TABLET | Freq: Every day | ORAL | 0 refills | Status: AC

## 2017-06-10 MED ORDER — CALCIUM CARBONATE-VITAMIN D 600-400 MG-UNIT PO TABS: 1.0000 | ORAL_TABLET | Freq: Every day | ORAL | 0 refills | Status: AC

## 2017-06-10 MED ORDER — PANTOPRAZOLE SODIUM 40 MG IV SOLR
40.0000 mg | Freq: Once | INTRAVENOUS | Status: AC
  Administered 2017-06-11: 40 mg via INTRAVENOUS
  Filled 2017-06-10: qty 40

## 2017-06-10 MED ORDER — PREGABALIN 75 MG PO CAPS: 75.0000 mg | ORAL_CAPSULE | Freq: Two times a day (BID) | ORAL | 0 refills | Status: AC

## 2017-06-10 MED ORDER — FONDAPARINUX SODIUM 7.5 MG/0.6ML ~~LOC~~ SOLN
7.5000 mg | Freq: Every day | SUBCUTANEOUS | Status: DC
  Administered 2017-06-10: 7.5 mg via SUBCUTANEOUS
  Filled 2017-06-10: qty 0.6

## 2017-06-10 MED ORDER — ALBUTEROL SULFATE HFA 108 (90 BASE) MCG/ACT IN AERS
2.0000 | INHALATION_SPRAY | Freq: Four times a day (QID) | RESPIRATORY_TRACT | 1 refills | Status: AC | PRN
Start: 2017-06-10 — End: ?

## 2017-06-10 MED ORDER — CIPROFLOXACIN IN D5W 400 MG/200ML IV SOLN
400.0000 mg | Freq: Once | INTRAVENOUS | Status: AC
  Administered 2017-06-11: 400 mg via INTRAVENOUS
  Filled 2017-06-10: qty 200

## 2017-06-10 MED ORDER — FONDAPARINUX SODIUM 7.5 MG/0.6ML ~~LOC~~ SOLN: 7.5000 mg | SUBCUTANEOUS | 2 refills | Status: AC

## 2017-06-10 MED ORDER — SODIUM CHLORIDE 0.9 % IV SOLN
50.0000 ug/h | INTRAVENOUS | Status: DC
  Administered 2017-06-11 – 2017-06-12 (×3): 50 ug/h via INTRAVENOUS
  Filled 2017-06-10 (×3): qty 1

## 2017-06-10 MED ORDER — LEVOTHYROXINE SODIUM 50 MCG PO TABS: 50.0000 ug | ORAL_TABLET | Freq: Every day | ORAL | 0 refills | Status: AC

## 2017-06-10 MED ORDER — TIOTROPIUM BROMIDE MONOHYDRATE 18 MCG IN CAPS: 18.0000 ug | ORAL_CAPSULE | Freq: Every day | RESPIRATORY_TRACT | 1 refills | Status: AC

## 2017-06-10 MED ORDER — ATORVASTATIN CALCIUM 10 MG PO TABS: 10.0000 mg | ORAL_TABLET | Freq: Every day | ORAL | 0 refills | Status: AC

## 2017-06-10 MED ORDER — CYCLOBENZAPRINE HCL 5 MG PO TABS: 5.0000 mg | ORAL_TABLET | Freq: Three times a day (TID) | ORAL | 0 refills | Status: AC | PRN

## 2017-06-10 MED ORDER — OMEPRAZOLE 40 MG PO CPDR: 40.0000 mg | DELAYED_RELEASE_CAPSULE | Freq: Every day | ORAL | 0 refills | Status: AC

## 2017-06-10 MED ORDER — ESCITALOPRAM OXALATE 20 MG PO TABS: 20.0000 mg | ORAL_TABLET | Freq: Every day | ORAL | 0 refills | Status: AC

## 2017-06-10 MED ORDER — ABACAVIR-DOLUTEGRAVIR-LAMIVUD 600-50-300 MG PO TABS: 1.0000 | ORAL_TABLET | Freq: Every day | ORAL | 0 refills | Status: AC

## 2017-06-10 NOTE — Progress Notes (Signed)
ANTICOAGULATION CONSULT NOTE - Initial Consult  Pharmacy Consult for heparin  Indication: VTE treatment  Allergies  Allergen Reactions  . Aspirin Anaphylaxis  . Bee Venom Anaphylaxis  . Penicillins Anaphylaxis and Other (See Comments)    Has patient had a PCN reaction causing immediate rash, facial/tongue/throat swelling, SOB or lightheadedness with hypotension: Yes Has patient had a PCN reaction causing severe rash involving mucus membranes or skin necrosis: No Has patient had a PCN reaction that required hospitalization No Has patient had a PCN reaction occurring within the last 10 years: Yes If all of the above answers are "NO", then may proceed with Cephalosporin use.  . Prednisone Anaphylaxis  . Sulfa Antibiotics Anaphylaxis  . Sulfasalazine Anaphylaxis  . Theophyllines Anaphylaxis  . Theophylline Swelling    Patient Measurements: Height: 5\' 5"  (165.1 cm) Weight: 174 lb 6.4 oz (79.1 kg) IBW/kg (Calculated) : 61.5 Heparin Dosing Weight: 78.3kg  Vital Signs: Temp: 97.7 F (36.5 C) (11/02 0504) Temp Source: Oral (11/02 0504) BP: 101/58 (11/02 0504) Pulse Rate: 64 (11/02 0504)  Labs:  Recent Labs  06/07/17 1234  06/07/17 1416  06/07/17 1423 06/08/17 0038  06/08/17 1919 06/09/17 0248 06/09/17 1012 06/10/17 0415  HGB  --   < > 10.7*  --   --  8.6*  --   --  8.2*  --  8.5*  HCT  --   < > 33.0*  --   --  26.9*  --   --  24.7*  --  25.5*  PLT  --   < > 197  --   --  152  --   --  161  --  147*  APTT  --   --   --   --  35  --   < > 114* 86* 73* 60*  LABPROT  --   --   --   --  17.3*  --   --   --   --   --   --   INR  --   --   --   --  1.43  --   --   --   --   --   --   HEPARINUNFRC  --   --   --   < > 2.34* 3.12*  < > 1.35*  --  0.72* 0.36  CREATININE  --   < > 5.27*  --   --  4.31*  --   --  2.48*  --  1.82*  CKTOTAL  --   --  113  --   --   --   --   --   --   --   --   TROPONINI <0.03  --   --   --   --   --   --   --   --   --   --   < > = values in this  interval not displayed.  Estimated Creatinine Clearance: 41.8 mL/min (A) (by C-G formula based on SCr of 1.82 mg/dL (H)).   Medical History: Past Medical History:  Diagnosis Date  . Anemia   . Asthma   . Cataracts, bilateral    worse in Rt eye  . Collagen vascular disease (HCC)   . COPD (chronic obstructive pulmonary disease) (HCC)   . Coronary artery disease   . Depression   . DVT (deep venous thrombosis) (HCC)   . Dysrhythmia   . Emphysema/COPD (HCC)   . Environmental and seasonal  allergies   . Family history of adverse reaction to anesthesia    sister PONV  . GERD (gastroesophageal reflux disease)   . H/O blood clots   . Heart murmur   . HIV (human immunodeficiency virus infection) (HCC)   . Hypertension   . Leaky heart valve    x 3  . Lung mass   . Myocardial infarction (HCC)    in 2000  . Pneumonia    year ago  . Pulmonary emboli (HCC)   . Type 2 diabetes mellitus (HCC)     Medications:  Infusions:  . heparin 650 Units/hr (06/09/17 2351)    Assessment: 61 yom cc dizziness with PMH DM, COPD, DVT and PE with IVC on Arixtra, CKD, avascular necrosis  Goal of Therapy:  Heparin level 0.3-0.7 units/ml Monitor platelets by anticoagulation protocol: Yes   Plan:  APTT therapeutic. Will check both HL and APPT in AM. Plan is to most likely switch pt back to fonduparenox tomorrow as long as renal function continues to improve.  11/2 0400 APTT 60, heparin level 0.36. Will go ahead and increase rate to 750 units/hr as aPTT is slightly subtherapeutic. Recheck heparin level and aPTT in 6 hours.  Fulton Reek, PharmD, BCPS  06/10/17 6:05 AM

## 2017-06-10 NOTE — Care Management (Signed)
Patient to discharge home with his sister today.  Resumption orders have been placed for home health.  Elnita MaxwellCheryl with [facility] notified of discharge.  RW to be delivered to room by Barbara CowerJason from [facility] prior to discharge.  Call has been made to sister to determine address which the patient will discharge too.  Message left. Will follow up with sister when she arrives for discharge.  Referral has been made for home palliative.   Clydie BraunKaren with [facility] notified.

## 2017-06-10 NOTE — Clinical Social Work Note (Signed)
CSW informed by MD patient has decided to go home with his sister. RN CM is aware and home health is ordered. York SpanielMonica Kimori Tartaglia MSW,LCSW [phone]

## 2017-06-10 NOTE — ED Triage Notes (Signed)
Explained wait process to family.  Verbalized understanding. Pt remains in NAD

## 2017-06-10 NOTE — ED Provider Notes (Signed)
[facility] Emergency Department Provider Note   ____________________________________________   I have reviewed the triage vital signs and the nursing notes.   HISTORY  Chief Complaint Hematemesis   History limited by: Not Limited   HPI Brian Hampton is a 61 y.o. male who presents to the emergency department today because of concern for vomiting blood.  DURATION:started just prior to arrival TIMING: roughly 7 discreet episodes QUALITY: bright red blood CONTEXT: patient was just discharged from the hospital earlier today. He states that on the drive home he started vomiting blood. Does have a history of cirrhosis.  ASSOCIATED SYMPTOMS: weakness. Denies abdominal pain.  Per medical record review patient has a history of discharge today. Cirrhosis. Was diagnosed with blood clot while in hospital.  Past Medical History:  Diagnosis Date  . Anemia   . Asthma   . Cataracts, bilateral    worse in Rt eye  . Collagen vascular disease (HCC)   . COPD (chronic obstructive pulmonary disease) (HCC)   . Coronary artery disease   . Depression   . DVT (deep venous thrombosis) (HCC)   . Dysrhythmia   . Emphysema/COPD (HCC)   . Environmental and seasonal allergies   . Family history of adverse reaction to anesthesia    sister PONV  . GERD (gastroesophageal reflux disease)   . H/O blood clots   . Heart murmur   . HIV (human immunodeficiency virus infection) (HCC)   . Hypertension   . Leaky heart valve    x 3  . Lung mass   . Myocardial infarction (HCC)    in 2000  . Pneumonia    year ago  . Pulmonary emboli (HCC)   . Type 2 diabetes mellitus [facility]HCC)     Patient Active Problem List   Diagnosis Date Noted  . AKI (acute kidney injury) (HCC) 06/07/2017  . Avascular necrosis of bones of both hips (HCC) 05/04/2017  . 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 Falls 05/02/2017  . Acute deep vein thrombosis (DVT) of femoral vein of right lower extremity (HCC)  04/04/2017  . Left leg pain 03/22/2017  . DVT (deep venous thrombosis) (HCC) 03/12/2017  . Acute deep vein thrombosis (DVT) of right lower extremity (HCC) 02/25/2017  . Postoperative pain   . COPD exacerbation (HCC) 12/23/2016  . Abdominal pain   . Compression fracture of body of thoracic vertebra (HCC) 12/21/2016  . Hypomagnesemia 05/05/2016  . Rhabdomyolysis 05/05/2016  . Acute pulmonary embolism (HCC) 03/17/2016  . Right leg DVT (HCC) 03/17/2016  . Hypokalemia 03/17/2016  . S/P IVC filter 03/17/2016  . SOB (shortness of breath) 03/15/2016  . Dysphagia 02/02/2016  . GERD (gastroesophageal reflux disease) 02/02/2016  . Hyperthyroidism 01/31/2016  . 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 Elevated transaminase level 01/31/2016  . Anemia 01/31/2016  . Thrombocytopenia (HCC) 01/31/2016  . Pyuria 01/31/2016  . Weakness 01/29/2016  . HIV (human immunodeficiency virus infection) (HCC) 01/29/2016  . CAD (coronary artery disease) 01/29/2016  . COPD (chronic obstructive pulmonary disease) (HCC) 01/29/2016  . Diabetes mellitus (HCC) 01/29/2016  . Leg weakness, bilateral 01/13/2016  . Chest pain 06/24/2015    Past Surgical History:  Procedure Laterality Date  . ANKLE ARTHROSCOPY    . BACK SURGERY    . CARDIAC CATHETERIZATION    . IVC FILTER INSERTION    . KYPHOPLASTY N/A 12/21/2016   Procedure: KYPHOPLASTY;  Surgeon: Kennedy BuckerMenz, Michael, MD;  Location: [facility] ORS;  Service: Orthopedics;  Laterality: N/A;  . PERIPHERAL VASCULAR CATHETERIZATION  N/A 03/16/2016   Procedure: IVC Filter Insertion;  Surgeon: Renford Dills, MD;  Location: [facility] INVASIVE CV LAB;  Service: Cardiovascular;  Laterality: N/A;  . SINUS EXPLORATION    . TONSILLECTOMY    . WRIST ARTHROSCOPY Right     Prior to Admission medications   Medication Sig Start Date End Date Taking? Authorizing Provider  abacavir-dolutegravir-lamiVUDine (TRIUMEQ) 600-50-300 MG tablet Take 1 tablet by mouth daily after breakfast. Reported on  01/30/2016 06/10/17   Enedina Finner, MD  acetaminophen (TYLENOL) 325 MG tablet Take 1 tablet (325 mg total) by mouth every 6 (six) hours as needed for mild pain (or Fever >/= 101). 03/15/17   Gouru, Aruna, MD  albuterol (PROVENTIL HFA;VENTOLIN HFA) 108 (90 Base) MCG/ACT inhaler Inhale 2 puffs into the lungs every 6 (six) hours as needed for wheezing or shortness of breath. 06/10/17   Enedina Finner, MD  albuterol (PROVENTIL) (2.5 MG/3ML) 0.083% nebulizer solution Take 2.5 mg by nebulization 3 (three) times daily.     [provider]  amiodarone (PACERONE) 400 MG tablet Take 1 tablet (400 mg total) by mouth daily. 06/10/17   Enedina Finner, MD  atorvastatin (LIPITOR) 10 MG tablet Take 1 tablet (10 mg total) by mouth daily after breakfast. 06/10/17   Enedina Finner, MD  Calcium Carbonate-Vitamin D (CALCIUM 600+D) 600-400 MG-UNIT tablet Take 1 tablet by mouth daily after breakfast. 06/10/17   Enedina Finner, MD  cetirizine (ZYRTEC) 10 MG tablet Take 1 tablet (10 mg total) by mouth daily as needed for allergies. 06/10/17   Enedina Finner, MD  cyclobenzaprine (FLEXERIL) 5 MG tablet Take 1 tablet (5 mg total) by mouth 3 (three) times daily as needed for muscle spasms. 06/10/17   Enedina Finner, MD  docusate sodium (COLACE) 50 MG capsule Take 50 mg by mouth daily as needed for mild constipation.     [provider]  EPINEPHrine (EPIPEN 2-PAK) 0.3 mg/0.3 mL IJ SOAJ injection Inject 0.3 mg into the muscle once as needed. For anaphylaxis    [provider]  escitalopram (LEXAPRO) 20 MG tablet Take 1 tablet (20 mg total) by mouth daily after breakfast. 06/10/17   Enedina Finner, MD  fondaparinux (ARIXTRA) 7.5 MG/0.6ML SOLN injection Inject 0.6 mLs (7.5 mg total) into the skin daily. 06/10/17   Enedina Finner, MD  HYDROcodone-acetaminophen (NORCO) 5-325 MG tablet Take 1 tablet by mouth every 6 (six) hours as needed for moderate pain. 05/06/17   Shaune Pollack, MD  levothyroxine (SYNTHROID, LEVOTHROID) 50 MCG tablet Take 1 tablet  (50 mcg total) by mouth daily before breakfast. 06/10/17   Enedina Finner, MD  linaclotide [facility]) 72 MCG capsule Take 72 mcg by mouth daily after breakfast.    [provider]  Magnesium Oxide 500 MG TABS Take 1 tablet (500 mg total) by mouth daily after breakfast. 06/10/17   Enedina Finner, MD  meloxicam (MOBIC) 7.5 MG tablet Take 1 tablet (7.5 mg total) by mouth daily as needed for pain. 06/10/17   Enedina Finner, MD  mirtazapine (REMERON) 15 MG tablet Take 1 tablet (15 mg total) by mouth at bedtime. 06/10/17   Enedina Finner, MD  mometasone-formoterol [facility]) 200-5 MCG/ACT AERO Inhale 2 puffs into the lungs 2 (two) times daily. 06/10/17   Enedina Finner, MD  Multiple Vitamin (MULTIVITAMIN WITH MINERALS) TABS tablet Take 1 tablet by mouth daily.     [provider]  nitroGLYCERIN (NITROSTAT) 0.4 MG SL tablet Place 0.4 mg under the tongue every 5 (five) minutes as needed for chest  pain.    [provider]  omeprazole (PRILOSEC) 40 MG capsule Take 1 capsule (40 mg total) by mouth daily after breakfast. 06/10/17   Enedina Finner, MD  polyethylene glycol (MIRALAX / Ethelene Hal) packet Take 17 g by mouth daily as needed for mild constipation. Patient not taking: Reported on 05/30/2017 05/06/17   Shaune Pollack, MD  pregabalin (LYRICA) 75 MG capsule Take 1 capsule (75 mg total) by mouth 2 (two) times daily. 06/10/17   Enedina Finner, MD  sennosides-docusate sodium (SENOKOT-S) 8.6-50 MG tablet Take 1 tablet by mouth at bedtime as needed for constipation.    [provider]  tiotropium (SPIRIVA) 18 MCG inhalation capsule Place 1 capsule (18 mcg total) into inhaler and inhale daily. 06/10/17   Enedina Finner, MD  zolpidem (AMBIEN) 5 MG tablet Take 1 tablet (5 mg total) by mouth at bedtime. Patient not taking: Reported on 06/07/2017 05/06/17   Shaune Pollack, MD    Allergies Aspirin; Bee venom; Penicillins; Prednisone; Sulfa antibiotics; Sulfasalazine; Theophyllines; and Theophylline  Family History   Problem Relation Age of Onset  . CAD Unknown   . Asthma Mother   . Cirrhosis Father   . Deep vein thrombosis Neg Hx   . Diabetes Neg Hx     Social History Social History  Substance Use Topics  . Smoking status: Former Smoker    Quit date: 12/16/2001  . Smokeless tobacco: Never Used  . Alcohol use No    Review of Systems Constitutional: No fever/chills. Positive for generalized weakness. Eyes: No visual changes. ENT: No sore throat. Cardiovascular: Denies chest pain. Respiratory: Denies shortness of breath. Gastrointestinal: No abdominal pain.  Positive for bloody vomit. Genitourinary: Negative for dysuria. Musculoskeletal: Negative for back pain. Skin: Negative for rash. Neurological: Negative for headaches, focal weakness or numbness.  ____________________________________________   PHYSICAL EXAM:  VITAL SIGNS: ED Triage Vitals  Enc Vitals Group     BP 06/10/17 1616 121/72     Pulse Rate 06/10/17 1616 73     Resp 06/10/17 1616 16     Temp 06/10/17 1616 98.8 F (37.1 C)     Temp Source 06/10/17 1616 Oral     SpO2 06/10/17 1616 98 %     Weight 06/10/17 1617 174 lb (78.9 kg)     Height 06/10/17 1617 5\' 5"  (1.651 m)     Head Circumference --      Peak Flow --      Pain Score 06/10/17 1616 9   Constitutional: Alert and oriented.  Eyes: Conjunctivae are normal.  ENT   Head: Normocephalic and atraumatic.   Nose: No congestion/rhinnorhea.   Mouth/Throat: Mucous membranes are moist.   Neck: No stridor. Hematological/Lymphatic/Immunilogical: No cervical lymphadenopathy. Cardiovascular: Normal rate, regular rhythm.  No murmurs, rubs, or gallops.  Respiratory: Normal respiratory effort without tachypnea nor retractions. Breath sounds are clear and equal bilaterally. No wheezes/rales/rhonchi. Gastrointestinal: Soft and non tender. No rebound. No guarding.  Rectal: No red blood on glove. No melena. Positive GUIAC. Musculoskeletal: Normal range of motion in  all extremities. No lower extremity edema. Neurologic:  Normal speech and language. No gross focal neurologic deficits are appreciated.  Skin:  Skin is warm, dry and intact. No rash noted. Psychiatric: Mood and affect are normal. Speech and behavior are normal. Patient exhibits appropriate insight and judgment.  ____________________________________________    LABS (pertinent positives/negatives)  CBC wbg 5.3, hgb 10.6 CMP cr 1.76, glu 114 Lipase 19  ____________________________________________   EKG  None  ____________________________________________  RADIOLOGY  None  ____________________________________________   PROCEDURES  Procedures  ____________________________________________   INITIAL IMPRESSION / ASSESSMENT AND PLAN / ED COURSE  Pertinent labs & imaging results that were available during my care of the patient were reviewed by me and considered in my medical decision making (see chart for details).  Patient presented with bloody emesis. Concern for bleeding ulcer, varices, mallory wise, lower gi bleed amongst other etiologies. No episodes of bloody vomiting here however given history concern for varices. Will plan on treating for such and admission to the hospitalist service.   Discussed plan with patient and family. ____________________________________________   FINAL CLINICAL IMPRESSION(S) / ED DIAGNOSES  Final diagnoses:  Hematemesis, presence of nausea not specified     Note: This dictation was prepared with Dragon dictation. Any transcriptional errors that result from this process are unintentional     Phineas Semen, MD 06/11/17 1208

## 2017-06-10 NOTE — ED Triage Notes (Signed)
Pt in via POV with complaints of vomiting blood x one day, pt reports LLQ abdominal pain w/ some diarrhea x 3 days.  Vitals WDL, NAD noted at this time.

## 2017-06-10 NOTE — Discharge Summary (Signed)
[facility] - [place] at [facility]   PATIENT NAME: Brian Hampton    MR#:  [id number]  DATE OF BIRTH:  1955/10/22  DATE OF ADMISSION:  06/07/2017 ADMITTING PHYSICIAN: Milagros Loll, MD  DATE OF DISCHARGE: 06/10/2017  PRIMARY CARE PHYSICIAN: Lyndon Code, MD    ADMISSION DIAGNOSIS:  Hyperkalemia [E87.5] Swelling [R60.9] AKI (acute kidney injury) (HCC) [N17.9] Uremia, acute [N19] Acute renal failure superimposed on chronic kidney disease, unspecified CKD stage, unspecified acute renal failure type (HCC) [N17.9, N18.9]  DISCHARGE DIAGNOSIS:  Acute on chronic renal failure secondary to bladder outlet obstruction--- status post Foley catheter.  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  Patient has history of IVC filter placement. DM-2 SECONDARY DIAGNOSIS:   Past Medical History:  Diagnosis Date  . Anemia   . Asthma   . Cataracts, bilateral    worse in Rt eye  . Collagen vascular disease (HCC)   . COPD (chronic obstructive pulmonary disease) (HCC)   . Coronary artery disease   . Depression   . DVT (deep venous thrombosis) (HCC)   . Dysrhythmia   . Emphysema/COPD (HCC)   . Environmental and seasonal allergies   . Family history of adverse reaction to anesthesia    sister PONV  . GERD (gastroesophageal reflux disease)   . H/O blood clots   . Heart murmur   . HIV (human immunodeficiency virus infection) (HCC)   . Hypertension   . Leaky heart valve    x 3  . Lung mass   . Myocardial infarction (HCC)    in 2000  . Pneumonia    year ago  . Pulmonary emboli (HCC)   . Type 2 diabetes mellitus [facility])     HOSPITAL COURSE:   Brian Hampton a 61 y.o.malewith a known history of diabetes, COPD, DVT and PE with IVC filter on Arixtra, CKD stage III, avascular necrosis of left hip with chronic pain presents to the emergency room sent in by his primary care physician due to acute kidney injury. Baseline creatinine is  1.2 and today is 5.5 with potassium of 5.7. The patient has noticed some blood in his urine  * AKI over CKD3--due to obstructive uropathy -Patient has baseline creatinine of 1.4 -ultrasound renal shows--bladder outlet obstruction - urology consultation with Dr.Wren appreciated--recommends patient be discharged with Foley catheter and follow-up as outpatient for further evaluation -Patient came in with creatinine of 5.27 --- 4.31 --2.28--1.8 - Avoid nephrotoxic medications. -I have held patient's Lasix also.  *Bilateral lower extremity edema. He does have history of DVT with IVC filter in place.  Patient has history of factor V Leyden deficiency--heterozygous -On Arixtra at home--now resumed since creat better -Ultrasound Doppler shows Evidence of acute deep venous thrombosis, incompletely obstructing, in portions of the proximal right femoral vein -  Heparin gtt d/ced -Patient will need to be on Arixtra at home.This was discussed with Dr. Guy Sandifer  *Diabetes mellitus. Start sliding scale insulin.  -Diabetic diet  * HIV Continue home meds  *History of hypertension. -Patient's blood pressure was on the lower side during the hospital stay he was asymptomatic.  I have held all his blood pressure meds. -Defer to primary care physician decision regarding resuming these down the road if needed  *Generalized weakness both lower extremities. Patient states he has not walked in the last 2 weeks. He has been having frequent falls at home.  -Physical therapy recommends HHPT  Cm for d/c planning CONSULTS OBTAINED:  Treatment Team:  Mady Haagensen, MD Earna Coder, MD  DRUG ALLERGIES:   Allergies  Allergen Reactions  . Aspirin Anaphylaxis  . Bee Venom Anaphylaxis  . Penicillins Anaphylaxis and Other (See Comments)    Has patient had a PCN reaction causing immediate rash, facial/tongue/throat swelling, SOB or lightheadedness with hypotension: Yes Has patient  had a PCN reaction causing severe rash involving mucus membranes or skin necrosis: No Has patient had a PCN reaction that required hospitalization No Has patient had a PCN reaction occurring within the last 10 years: Yes If all of the above answers are "NO", then may proceed with Cephalosporin use.  . Prednisone Anaphylaxis  . Sulfa Antibiotics Anaphylaxis  . Sulfasalazine Anaphylaxis  . Theophyllines Anaphylaxis  . Theophylline Swelling    DISCHARGE MEDICATIONS:   Current Discharge Medication List    CONTINUE these medications which have NOT CHANGED   Details  abacavir-dolutegravir-lamiVUDine (TRIUMEQ) 600-50-300 MG tablet Take 1 tablet by mouth daily after breakfast. Reported on 01/30/2016    acetaminophen (TYLENOL) 325 MG tablet Take 1 tablet (325 mg total) by mouth every 6 (six) hours as needed for mild pain (or Fever >/= 101).    albuterol (PROVENTIL HFA;VENTOLIN HFA) 108 (90 BASE) MCG/ACT inhaler Inhale 2 puffs into the lungs every 6 (six) hours as needed for wheezing or shortness of breath.    albuterol (PROVENTIL) (2.5 MG/3ML) 0.083% nebulizer solution Take 2.5 mg by nebulization 3 (three) times daily.     amiodarone (PACERONE) 400 MG tablet Take 1 tablet (400 mg total) by mouth daily.    atorvastatin (LIPITOR) 10 MG tablet Take 10 mg by mouth daily after breakfast.    Calcium Carbonate-Vitamin D (CALCIUM 600+D) 600-400 MG-UNIT tablet Take 1 tablet by mouth daily after breakfast.     cetirizine (ZYRTEC) 10 MG tablet Take 10 mg by mouth daily as needed for allergies.     cyclobenzaprine (FLEXERIL) 5 MG tablet Take 1 tablet (5 mg total) by mouth 3 (three) times daily as needed for muscle spasms. Qty: 15 tablet, Refills: 0   Associated Diagnoses: Acute deep vein thrombosis (DVT) of femoral vein of right lower extremity (HCC)    docusate sodium (COLACE) 50 MG capsule Take 50 mg by mouth daily as needed for mild constipation.     EPINEPHrine (EPIPEN 2-PAK) 0.3 mg/0.3 mL IJ  SOAJ injection Inject 0.3 mg into the muscle once as needed. For anaphylaxis    escitalopram (LEXAPRO) 20 MG tablet Take 20 mg by mouth daily after breakfast.     fondaparinux (ARIXTRA) 7.5 MG/0.6ML SOLN injection Inject 0.6 mLs (7.5 mg total) into the skin daily. Qty: 30 Syringe, Refills: 2   Associated Diagnoses: Acute deep vein thrombosis (DVT) of femoral vein of right lower extremity (HCC)    HYDROcodone-acetaminophen (NORCO) 5-325 MG tablet Take 1 tablet by mouth every 6 (six) hours as needed for moderate pain. Qty: 12 tablet, Refills: 0   Associated Diagnoses: Acute deep vein thrombosis (DVT) of femoral vein of right lower extremity (HCC)    linaclotide (LINZESS) 72 MCG capsule Take 72 mcg by mouth daily after breakfast.    Magnesium Oxide 500 MG TABS Take 500 mg by mouth daily after breakfast.    meloxicam (MOBIC) 7.5 MG tablet Take 7.5 mg by mouth daily as needed for pain.    mirtazapine (REMERON) 15 MG tablet Take 15 mg by mouth at bedtime.     mometasone-formoterol (DULERA) 200-5 MCG/ACT AERO Inhale 2 puffs into the lungs 2 (two) times  daily.     Multiple Vitamin (MULTIVITAMIN WITH MINERALS) TABS tablet Take 1 tablet by mouth daily.     nitroGLYCERIN (NITROSTAT) 0.4 MG SL tablet Place 0.4 mg under the tongue every 5 (five) minutes as needed for chest pain.    pregabalin (LYRICA) 75 MG capsule Take 1 capsule (75 mg total) by mouth 2 (two) times daily. Qty: 30 capsule, Refills: 0    sennosides-docusate sodium (SENOKOT-S) 8.6-50 MG tablet Take 1 tablet by mouth at bedtime as needed for constipation.    tiotropium (SPIRIVA) 18 MCG inhalation capsule Place 18 mcg into inhaler and inhale daily.    levothyroxine (SYNTHROID, LEVOTHROID) 50 MCG tablet Take 50 mcg by mouth daily before breakfast.    omeprazole (PRILOSEC) 40 MG capsule Take 40 mg by mouth daily after breakfast.    polyethylene glycol (MIRALAX / GLYCOLAX) packet Take 17 g by mouth daily as needed for mild  constipation. Qty: 14 each, Refills: 0    zolpidem (AMBIEN) 5 MG tablet Take 1 tablet (5 mg total) by mouth at bedtime. Qty: 14 tablet, Refills: 0      STOP taking these medications     furosemide (LASIX) 20 MG tablet      losartan (COZAAR) 25 MG tablet      metoprolol succinate (TOPROL-XL) 100 MG 24 hr tablet      doxycycline (VIBRA-TABS) 100 MG tablet      metoprolol succinate (TOPROL-XL) 50 MG 24 hr tablet      Potassium Chloride CR (MICRO-K) 8 MEQ CPCR capsule CR      traMADol (ULTRAM) 50 MG tablet         If you experience worsening of your admission symptoms, develop shortness of breath, life threatening emergency, suicidal or homicidal thoughts you must seek medical attention immediately by calling 911 or calling your MD immediately  if symptoms less severe.  You Must read complete instructions/literature along with all the possible adverse reactions/side effects for all the Medicines you take and that have been prescribed to you. Take any new Medicines after you have completely understood and accept all the possible adverse reactions/side effects.   Please note  You were cared for by a hospitalist during your hospital stay. If you have any questions about your discharge medications or the care you received while you were in the hospital after you are discharged, you can call the unit and asked to speak with the hospitalist on call if the hospitalist that took care of you is not available. Once you are discharged, your primary care physician will handle any further medical issues. Please note that NO REFILLS for any discharge medications will be authorized once you are discharged, as it is imperative that you return to your primary care physician (or establish a relationship with a primary care physician if you do not have one) for your aftercare needs so that they can reassess your need for medications and monitor your lab values. Today   SUBJECTIVE   I  feel a whole lot  better  VITAL SIGNS:  Blood pressure 98/64, pulse 63, temperature 97.8 F (36.6 C), temperature source Oral, resp. rate 18, height 5\' 5"  (1.651 m), weight 79.1 kg (174 lb 6.4 oz), SpO2 100 %.  I/O:   Intake/Output Summary (Last 24 hours) at 06/10/17 1150 Last data filed at 06/10/17 0928  Gross per 24 hour  Intake           964.39 ml  Output  1475 ml  Net          -510.61 ml    PHYSICAL EXAMINATION:  GENERAL:  61 y.o.-year-old patient lying in the bed with no acute distress.  EYES: Pupils equal, round, reactive to light and accommodation. No scleral icterus. Extraocular muscles intact.  HEENT: Head atraumatic, normocephalic. Oropharynx and nasopharynx clear.  NECK:  Supple, no jugular venous distention. No thyroid enlargement, no tenderness.  LUNGS: Normal breath sounds bilaterally, no wheezing, rales,rhonchi or crepitation. No use of accessory muscles of respiration.  CARDIOVASCULAR: S1, S2 normal. No murmurs, rubs, or gallops.  ABDOMEN: Soft, non-tender, non-distended. Bowel sounds present. No organomegaly or mass. Foley+ EXTREMITIES: No pedal edema, cyanosis, or clubbing.  NEUROLOGIC: Cranial nerves II through XII are intact. Muscle strength 5/5 in all extremities. Sensation intact. Gait not checked.  PSYCHIATRIC: The patient is alert and oriented x 3.  SKIN: No obvious rash, lesion, or ulcer.   DATA REVIEW:   CBC   Recent Labs Lab 06/10/17 0415  WBC 4.8  HGB 8.5*  HCT 25.5*  PLT 147*    Chemistries   Recent Labs Lab 06/07/17 1416 06/08/17 0038  06/10/17 0415  NA 142 145  --   --   K 5.4* 3.8  --   --   CL 111 114*  --   --   CO2 19* 25  --   --   GLUCOSE 99 108*  --   --   BUN 64* 57*  --   --   CREATININE 5.27* 4.31*  < > 1.82*  CALCIUM 8.4* 8.0*  --   --   AST 41  --   --   --   ALT 20  --   --   --   ALKPHOS 239*  --   --   --   BILITOT 0.9  --   --   --   < > = values in this interval not displayed.  Microbiology Results   No results  found for this or any previous visit (from the past 240 hour(s)).  RADIOLOGY:  No results found.   Management plans discussed with the patient, family and they are in agreement.  CODE STATUS:     Code Status Orders        Start     Ordered   06/07/17 1543  Do not attempt resuscitation (DNR)  Continuous    Question Answer Comment  In the event of cardiac or respiratory ARREST Do not call a "code blue"   In the event of cardiac or respiratory ARREST Do not perform Intubation, CPR, defibrillation or ACLS   In the event of cardiac or respiratory ARREST Use medication by any route, position, wound care, and other measures to relive pain and suffering. May use oxygen, suction and manual treatment of airway obstruction as needed for comfort.      06/07/17 1544    Code Status History    Date Active Date Inactive Code Status Order ID Comments User Context   05/02/2017  8:23 PM 05/06/2017 10:01 PM Full Code [id number]  Milagros Loll, MD ED   03/22/2017 11:30 PM 03/23/2017  7:12 PM Full Code [id number]  Oralia Manis, MD ED   03/12/2017  8:23 AM 03/15/2017  6:42 PM Full Code [id number]  Ihor Austin, MD Inpatient   03/04/2017  4:05 AM 03/05/2017  6:33 PM Full Code [id number]  Arnaldo Natal, MD Inpatient   02/25/2017 11:52 PM 02/27/2017  6:06 PM DNR [id number]  Hugelmeyer, Alexis, DO Inpatient   02/25/2017 10:27 PM 02/25/2017 11:52 PM Full Code [id number]  Tonye Royalty, DO Inpatient   12/21/2016  6:14 PM 12/24/2016  3:03 PM DNR [id number]  Kennedy Bucker, MD Inpatient   05/05/2016 10:17 PM 05/12/2016  6:20 PM DNR [id number]  Altamese Dilling, MD Inpatient   03/15/2016  5:42 PM 03/17/2016  6:59 PM DNR [id number]  Auburn Bilberry, MD ED   03/15/2016  5:29 PM 03/15/2016  5:42 PM Full Code [id number]  Auburn Bilberry, MD ED   01/30/2016 12:55 AM 02/03/2016 12:55 AM DNR [id number]  Gery Pray, MD Inpatient   01/13/2016  9:46 PM 01/16/2016  4:18 PM Full Code [id number]  Shaune Pollack, MD Inpatient   06/24/2015  7:13 AM  06/25/2015 12:27 PM Full Code [id number]  Milagros Loll, MD ED    Advance Directive Documentation     Most Recent Value  Type of Advance Directive  Healthcare Power of Attorney  Pre-existing out of facility DNR order (yellow form or pink MOST form)  -  "MOST" Form in Place?  -      TOTAL TIME TAKING CARE OF THIS PATIENT: *40* minutes.    Lilyanna Lunt M.D on 06/10/2017 at 11:50 AM  Between 7am to 6pm - Pager - [phone] After 6pm go to [email or web address] - password [email or web address]  [facility]  Office  [phone]  CC: Primary care physician; Lyndon Code, MD

## 2017-06-10 NOTE — Discharge Instructions (Signed)
Foley care per protocol ?

## 2017-06-10 NOTE — ED Notes (Signed)
Family reports pt c/o chest pain now and would like NTG. Explained cannot give this. Added troponin and pulled for EKG

## 2017-06-10 NOTE — Progress Notes (Signed)
New referral for out patient palliative to follow received from attending physician Dr. Allena KatzPatel. CMRN Judeth CornfieldStephanie made aware.Referral made aware. Plan is for discharge home today with [facility]. Dayna BarkerKaren Robertson RN, BSN, [facility], hospital Liaison [phone] c

## 2017-06-10 NOTE — Progress Notes (Signed)
Brian Hampton to be D/C'd Home per MD order.  Discussed prescriptions and follow up appointments with the Hampton. Prescriptions given to Hampton, medication list explained in detail. Pt verbalized understanding.  Allergies as of 06/10/2017      Reactions   Aspirin Anaphylaxis   Bee Venom Anaphylaxis   Penicillins Anaphylaxis, Other (See Comments)   Has Hampton had a PCN reaction causing immediate rash, facial/tongue/throat swelling, SOB or lightheadedness with hypotension: Yes Has Hampton had a PCN reaction causing severe rash involving mucus membranes or skin necrosis: No Has Hampton had a PCN reaction that required hospitalization No Has Hampton had a PCN reaction occurring within the last 10 years: Yes If all of the above answers are "NO", then may proceed with Cephalosporin use.   Prednisone Anaphylaxis   Sulfa Antibiotics Anaphylaxis   Sulfasalazine Anaphylaxis   Theophyllines Anaphylaxis   Theophylline Swelling      Medication List    STOP taking these medications   doxycycline 100 MG tablet Commonly known as:  VIBRA-TABS   furosemide 20 MG tablet Commonly known as:  LASIX   losartan 25 MG tablet Commonly known as:  COZAAR   metoprolol succinate 100 MG 24 hr tablet Commonly known as:  TOPROL-XL   metoprolol succinate 50 MG 24 hr tablet Commonly known as:  TOPROL-XL   Potassium Chloride CR 8 MEQ Cpcr capsule CR Commonly known as:  MICRO-K   traMADol 50 MG tablet Commonly known as:  ULTRAM     TAKE these medications   abacavir-dolutegravir-lamiVUDine 600-50-300 MG tablet Commonly known as:  TRIUMEQ Take 1 tablet by mouth daily after breakfast. Reported on 01/30/2016   acetaminophen 325 MG tablet Commonly known as:  TYLENOL Take 1 tablet (325 mg total) by mouth every 6 (six) hours as needed for mild pain (or Fever >/= 101).   albuterol (2.5 MG/3ML) 0.083% nebulizer solution Commonly known as:  PROVENTIL Take 2.5 mg by nebulization 3 (three) times daily.    albuterol 108 (90 Base) MCG/ACT inhaler Commonly known as:  PROVENTIL HFA;VENTOLIN HFA Inhale 2 puffs into the lungs every 6 (six) hours as needed for wheezing or shortness of breath.   amiodarone 400 MG tablet Commonly known as:  PACERONE Take 1 tablet (400 mg total) by mouth daily.   atorvastatin 10 MG tablet Commonly known as:  LIPITOR Take 1 tablet (10 mg total) by mouth daily after breakfast.   Calcium Carbonate-Vitamin D 600-400 MG-UNIT tablet Commonly known as:  CALCIUM 600+D Take 1 tablet by mouth daily after breakfast.   cetirizine 10 MG tablet Commonly known as:  ZYRTEC Take 1 tablet (10 mg total) by mouth daily as needed for allergies.   cyclobenzaprine 5 MG tablet Commonly known as:  FLEXERIL Take 1 tablet (5 mg total) by mouth 3 (three) times daily as needed for muscle spasms. What changed:  when to take this  reasons to take this   docusate sodium 50 MG capsule Commonly known as:  COLACE Take 50 mg by mouth daily as needed for mild constipation.   EPIPEN 2-PAK 0.3 mg/0.3 mL Soaj injection Generic drug:  EPINEPHrine Inject 0.3 mg into the muscle once as needed. For anaphylaxis   escitalopram 20 MG tablet Commonly known as:  LEXAPRO Take 1 tablet (20 mg total) by mouth daily after breakfast.   fondaparinux 7.5 MG/0.6ML Soln injection Commonly known as:  ARIXTRA Inject 0.6 mLs (7.5 mg total) into the skin daily.   HYDROcodone-acetaminophen 5-325 MG tablet Commonly known as:  NORCO  Take 1 tablet by mouth every 6 (six) hours as needed for moderate pain.   levothyroxine 50 MCG tablet Commonly known as:  SYNTHROID, LEVOTHROID Take 1 tablet (50 mcg total) by mouth daily before breakfast.   linaclotide 72 MCG capsule Commonly known as:  LINZESS Take 72 mcg by mouth daily after breakfast.   Magnesium Oxide 500 MG Tabs Take 1 tablet (500 mg total) by mouth daily after breakfast.   meloxicam 7.5 MG tablet Commonly known as:  MOBIC Take 1 tablet (7.5  mg total) by mouth daily as needed for pain.   mirtazapine 15 MG tablet Commonly known as:  REMERON Take 1 tablet (15 mg total) by mouth at bedtime.   mometasone-formoterol 200-5 MCG/ACT Aero Commonly known as:  DULERA Inhale 2 puffs into the lungs 2 (two) times daily.   multivitamin with minerals Tabs tablet Take 1 tablet by mouth daily.   nitroGLYCERIN 0.4 MG SL tablet Commonly known as:  NITROSTAT Place 0.4 mg under the tongue every 5 (five) minutes as needed for chest pain.   omeprazole 40 MG capsule Commonly known as:  PRILOSEC Take 1 capsule (40 mg total) by mouth daily after breakfast.   polyethylene glycol packet Commonly known as:  MIRALAX / GLYCOLAX Take 17 g by mouth daily as needed for mild constipation.   pregabalin 75 MG capsule Commonly known as:  LYRICA Take 1 capsule (75 mg total) by mouth 2 (two) times daily.   sennosides-docusate sodium 8.6-50 MG tablet Commonly known as:  SENOKOT-S Take 1 tablet by mouth at bedtime as needed for constipation.   tiotropium 18 MCG inhalation capsule Commonly known as:  SPIRIVA Place 1 capsule (18 mcg total) into inhaler and inhale daily. What changed:  when to take this   zolpidem 5 MG tablet Commonly known as:  AMBIEN Take 1 tablet (5 mg total) by mouth at bedtime.            Durable Medical Equipment        Start     Ordered   06/10/17 1122  For home use only DME Walker rolling  Once    Question:  Hampton needs a walker to treat with the following condition  Answer:  Weakness   06/10/17 1122      Vitals:   06/10/17 0946 06/10/17 1426  BP: 98/64 (!) 154/98  Pulse: 63 88  Resp: 18 16  Temp: 97.8 F (36.6 C) (!) 97.2 F (36.2 C)  SpO2: 100% 100%    Skin clean, dry and intact without evidence of skin break down, no evidence of skin tears noted. IV catheter discontinued intact. Site without signs and symptoms of complications. Dressing and pressure applied. Pt denies pain at this time. No complaints  noted.  An After Visit Summary was printed and given to the Hampton. Hampton escorted via WC, and D/C home via private auto.  Brian Hampton

## 2017-06-10 NOTE — Care Management Important Message (Signed)
Important Message  Patient Details  Name: Brian Hampton MRN: [id number] Date of Birth: [birth year]   Medicare Important Message Given:  Yes    Chapman FitchBOWEN, Nyx Keady T, RN 06/10/2017, 11:37 AM

## 2017-06-11 DIAGNOSIS — N183 Chronic kidney disease, stage 3 (moderate): Secondary | ICD-10-CM | POA: Diagnosis not present

## 2017-06-11 DIAGNOSIS — B20 Human immunodeficiency virus [HIV] disease: Secondary | ICD-10-CM | POA: Diagnosis not present

## 2017-06-11 DIAGNOSIS — D649 Anemia, unspecified: Secondary | ICD-10-CM | POA: Diagnosis not present

## 2017-06-11 DIAGNOSIS — E039 Hypothyroidism, unspecified: Secondary | ICD-10-CM | POA: Diagnosis not present

## 2017-06-11 DIAGNOSIS — K920 Hematemesis: Secondary | ICD-10-CM | POA: Diagnosis not present

## 2017-06-11 LAB — URINALYSIS, COMPLETE (UACMP) WITH MICROSCOPIC
BILIRUBIN URINE: NEGATIVE
Glucose, UA: NEGATIVE mg/dL
Ketones, ur: NEGATIVE mg/dL
Leukocytes, UA: NEGATIVE
Nitrite: NEGATIVE
PROTEIN: 100 mg/dL — AB
Specific Gravity, Urine: 1.015 (ref 1.005–1.030)
pH: 5 (ref 5.0–8.0)

## 2017-06-11 LAB — PROTIME-INR
INR: 1.37
Prothrombin Time: 16.8 seconds — ABNORMAL HIGH (ref 11.4–15.2)

## 2017-06-11 LAB — COPPER, SERUM: Copper: 101 ug/dL (ref 72–166)

## 2017-06-11 LAB — TSH: TSH: 9.022 u[IU]/mL — ABNORMAL HIGH (ref 0.350–4.500)

## 2017-06-11 LAB — ZINC: Zinc: 65 ug/dL (ref 56–134)

## 2017-06-11 MED ORDER — ALBUTEROL SULFATE (2.5 MG/3ML) 0.083% IN NEBU
2.5000 mg | INHALATION_SOLUTION | Freq: Two times a day (BID) | RESPIRATORY_TRACT | Status: DC
  Administered 2017-06-11 – 2017-06-13 (×4): 2.5 mg via RESPIRATORY_TRACT
  Filled 2017-06-11 (×4): qty 3

## 2017-06-11 MED ORDER — ACETAMINOPHEN 650 MG RE SUPP: 650.0000 mg | Freq: Four times a day (QID) | RECTAL | Status: DC | PRN

## 2017-06-11 MED ORDER — LAMIVUDINE 150 MG PO TABS
150.0000 mg | ORAL_TABLET | Freq: Every day | ORAL | Status: DC
  Administered 2017-06-12 – 2017-06-13 (×2): 150 mg via ORAL
  Filled 2017-06-11 (×2): qty 1

## 2017-06-11 MED ORDER — LEVOTHYROXINE SODIUM 50 MCG PO TABS
50.0000 ug | ORAL_TABLET | Freq: Every day | ORAL | Status: DC
  Administered 2017-06-11 – 2017-06-13 (×3): 50 ug via ORAL
  Filled 2017-06-11 (×3): qty 1

## 2017-06-11 MED ORDER — ZOLPIDEM TARTRATE 5 MG PO TABS
5.0000 mg | ORAL_TABLET | Freq: Every day | ORAL | Status: DC
  Administered 2017-06-11 – 2017-06-12 (×2): 5 mg via ORAL
  Filled 2017-06-11 (×2): qty 1

## 2017-06-11 MED ORDER — POLYETHYLENE GLYCOL 3350 17 G PO PACK: 17.0000 g | PACK | Freq: Every day | ORAL | Status: DC | PRN

## 2017-06-11 MED ORDER — DOLUTEGRAVIR SODIUM 50 MG PO TABS
50.0000 mg | ORAL_TABLET | Freq: Every day | ORAL | Status: DC
  Administered 2017-06-12 – 2017-06-13 (×2): 50 mg via ORAL
  Filled 2017-06-11 (×2): qty 1

## 2017-06-11 MED ORDER — NITROGLYCERIN 0.4 MG SL SUBL: 0.4000 mg | SUBLINGUAL_TABLET | SUBLINGUAL | Status: DC | PRN

## 2017-06-11 MED ORDER — MIRTAZAPINE 15 MG PO TABS
15.0000 mg | ORAL_TABLET | Freq: Every day | ORAL | Status: DC
  Administered 2017-06-11 – 2017-06-12 (×2): 15 mg via ORAL
  Filled 2017-06-11 (×2): qty 1

## 2017-06-11 MED ORDER — ONDANSETRON HCL 4 MG PO TABS: 4.0000 mg | ORAL_TABLET | Freq: Four times a day (QID) | ORAL | Status: DC | PRN

## 2017-06-11 MED ORDER — ACETAMINOPHEN 325 MG PO TABS: 650.0000 mg | ORAL_TABLET | Freq: Four times a day (QID) | ORAL | Status: DC | PRN

## 2017-06-11 MED ORDER — ABACAVIR SULFATE 300 MG PO TABS
600.0000 mg | ORAL_TABLET | Freq: Every day | ORAL | Status: DC
  Administered 2017-06-12 – 2017-06-13 (×2): 600 mg via ORAL
  Filled 2017-06-11 (×2): qty 2

## 2017-06-11 MED ORDER — ADULT MULTIVITAMIN W/MINERALS CH
1.0000 | ORAL_TABLET | Freq: Every day | ORAL | Status: DC
  Administered 2017-06-11 – 2017-06-13 (×3): 1 via ORAL
  Filled 2017-06-11 (×3): qty 1

## 2017-06-11 MED ORDER — AMIODARONE HCL 200 MG PO TABS
400.0000 mg | ORAL_TABLET | Freq: Every day | ORAL | Status: DC
  Administered 2017-06-11 – 2017-06-13 (×3): 400 mg via ORAL
  Filled 2017-06-11 (×3): qty 2

## 2017-06-11 MED ORDER — ABACAVIR-DOLUTEGRAVIR-LAMIVUD 600-50-300 MG PO TABS
1.0000 | ORAL_TABLET | Freq: Every day | ORAL | Status: DC
  Administered 2017-06-11: 1 via ORAL
  Filled 2017-06-11: qty 1

## 2017-06-11 MED ORDER — MAGNESIUM OXIDE 400 (241.3 MG) MG PO TABS
400.0000 mg | ORAL_TABLET | Freq: Every day | ORAL | Status: DC
  Administered 2017-06-11 – 2017-06-13 (×3): 400 mg via ORAL
  Filled 2017-06-11 (×3): qty 1

## 2017-06-11 MED ORDER — CALCIUM CARBONATE-VITAMIN D 500-200 MG-UNIT PO TABS
1.0000 | ORAL_TABLET | Freq: Every day | ORAL | Status: DC
  Administered 2017-06-11 – 2017-06-13 (×3): 1 via ORAL
  Filled 2017-06-11 (×3): qty 1

## 2017-06-11 MED ORDER — ATORVASTATIN CALCIUM 10 MG PO TABS
10.0000 mg | ORAL_TABLET | Freq: Every day | ORAL | Status: DC
  Administered 2017-06-11 – 2017-06-13 (×3): 10 mg via ORAL
  Filled 2017-06-11 (×3): qty 1

## 2017-06-11 MED ORDER — ONDANSETRON HCL 4 MG/2ML IJ SOLN: 4.0000 mg | Freq: Four times a day (QID) | INTRAMUSCULAR | Status: DC | PRN

## 2017-06-11 MED ORDER — HYDROCODONE-ACETAMINOPHEN 5-325 MG PO TABS: 1.0000 | ORAL_TABLET | Freq: Four times a day (QID) | ORAL | Status: DC | PRN

## 2017-06-11 MED ORDER — PROCHLORPERAZINE EDISYLATE 5 MG/ML IJ SOLN
10.0000 mg | Freq: Four times a day (QID) | INTRAMUSCULAR | Status: DC | PRN
  Filled 2017-06-11: qty 2

## 2017-06-11 MED ORDER — PANTOPRAZOLE SODIUM 40 MG PO TBEC
40.0000 mg | DELAYED_RELEASE_TABLET | Freq: Every day | ORAL | Status: DC
  Administered 2017-06-11 – 2017-06-13 (×3): 40 mg via ORAL
  Filled 2017-06-11 (×3): qty 1

## 2017-06-11 MED ORDER — MAGNESIUM OXIDE -MG SUPPLEMENT 500 MG PO TABS: 500.0000 mg | ORAL_TABLET | Freq: Every day | ORAL | Status: DC

## 2017-06-11 MED ORDER — DOCUSATE SODIUM 100 MG PO CAPS
100.0000 mg | ORAL_CAPSULE | Freq: Two times a day (BID) | ORAL | Status: DC
  Administered 2017-06-11 – 2017-06-12 (×3): 100 mg via ORAL
  Filled 2017-06-11 (×4): qty 1

## 2017-06-11 MED ORDER — LORATADINE 10 MG PO TABS
10.0000 mg | ORAL_TABLET | Freq: Every day | ORAL | Status: DC
  Administered 2017-06-11 – 2017-06-13 (×3): 10 mg via ORAL
  Filled 2017-06-11 (×3): qty 1

## 2017-06-11 MED ORDER — MOMETASONE FURO-FORMOTEROL FUM 200-5 MCG/ACT IN AERO
2.0000 | INHALATION_SPRAY | Freq: Two times a day (BID) | RESPIRATORY_TRACT | Status: DC
  Administered 2017-06-11 – 2017-06-13 (×5): 2 via RESPIRATORY_TRACT
  Filled 2017-06-11: qty 8.8

## 2017-06-11 MED ORDER — PREGABALIN 75 MG PO CAPS
75.0000 mg | ORAL_CAPSULE | Freq: Two times a day (BID) | ORAL | Status: DC
  Administered 2017-06-11 – 2017-06-13 (×5): 75 mg via ORAL
  Filled 2017-06-11 (×5): qty 1

## 2017-06-11 MED ORDER — TIOTROPIUM BROMIDE MONOHYDRATE 18 MCG IN CAPS
18.0000 ug | ORAL_CAPSULE | Freq: Every day | RESPIRATORY_TRACT | Status: DC
  Administered 2017-06-11 – 2017-06-13 (×3): 18 ug via RESPIRATORY_TRACT
  Filled 2017-06-11: qty 5

## 2017-06-11 MED ORDER — CYCLOBENZAPRINE HCL 10 MG PO TABS: 5.0000 mg | ORAL_TABLET | Freq: Three times a day (TID) | ORAL | Status: DC | PRN

## 2017-06-11 MED ORDER — SODIUM CHLORIDE 0.9 % IV SOLN
INTRAVENOUS | Status: DC
  Administered 2017-06-11 – 2017-06-12 (×4): via INTRAVENOUS

## 2017-06-11 MED ORDER — LINACLOTIDE 72 MCG PO CAPS
72.0000 ug | ORAL_CAPSULE | Freq: Every day | ORAL | Status: DC
  Administered 2017-06-11 – 2017-06-13 (×3): 72 ug via ORAL
  Filled 2017-06-11 (×3): qty 1

## 2017-06-11 MED ORDER — ESCITALOPRAM OXALATE 10 MG PO TABS
20.0000 mg | ORAL_TABLET | Freq: Every day | ORAL | Status: DC
  Administered 2017-06-11 – 2017-06-13 (×3): 20 mg via ORAL
  Filled 2017-06-11 (×3): qty 2

## 2017-06-11 MED ORDER — ALBUTEROL SULFATE (2.5 MG/3ML) 0.083% IN NEBU
2.5000 mg | INHALATION_SOLUTION | Freq: Three times a day (TID) | RESPIRATORY_TRACT | Status: DC
  Administered 2017-06-11: 2.5 mg via RESPIRATORY_TRACT
  Filled 2017-06-11: qty 3

## 2017-06-11 NOTE — H&P (Signed)
Brian Hampton is an 61 y.o. male.   Chief Complaint: Hematemesis HPI: The patient with past medical history of coronary artery disease status post MI, COPD, diabetes and HIV presents to the emergency department complaining of hematemesis.  The patient states that he has episodes where he feels lightheaded or weak in his stomach that precede nausea plus or minus vomiting.  He had one episode of vomiting today that appeared bloody.  He cannot quantify the amount of blood.  He states that he has seen blood in his emesis only once this hospitalization but that he had multiple episodes of nonbloody emesis in the preceding few days.  In the emergency department the patient was hemodynamically stable but due to his history of GI issues as well as comorbidities the emergency department staff called the hospitalist service for admission.  Past Medical History:  Diagnosis Date  . Anemia   . Asthma   . Cataracts, bilateral    worse in Rt eye  . Collagen vascular disease ([place])   . COPD (chronic obstructive pulmonary disease) ([place])   . Coronary artery disease   . Depression   . DVT (deep venous thrombosis) ([place])   . Dysrhythmia   . Emphysema/COPD ([place])   . Environmental and seasonal allergies   . Family history of adverse reaction to anesthesia    sister PONV  . GERD (gastroesophageal reflux disease)   . H/O blood clots   . Heart murmur   . HIV (human immunodeficiency virus infection) ([place])   . Hypertension   . Leaky heart valve    x 3  . Lung mass   . Myocardial infarction ([place])    in 2000  . Pneumonia    year ago  . Pulmonary emboli ([place])   . Type 2 diabetes mellitus ([place])     Past Surgical History:  Procedure Laterality Date  . ANKLE ARTHROSCOPY    . BACK SURGERY    . CARDIAC CATHETERIZATION    . IVC FILTER INSERTION    . KYPHOPLASTY N/A 12/21/2016   Procedure: KYPHOPLASTY;  Surgeon: Hessie Knows, MD;  Location: [facility] ORS;  Service: Orthopedics;  Laterality: N/A;  . PERIPHERAL  VASCULAR CATHETERIZATION N/A 03/16/2016   Procedure: IVC Filter Insertion;  Surgeon: Katha Cabal, MD;  Location: [facility];  Service: Cardiovascular;  Laterality: N/A;  . SINUS EXPLORATION    . TONSILLECTOMY    . WRIST ARTHROSCOPY Right     Family History  Problem Relation Age of Onset  . CAD Unknown   . Asthma Mother   . Cirrhosis Father   . Deep vein thrombosis Neg Hx   . Diabetes Neg Hx    Social History:  reports that he quit smoking about 15 years ago. He has never used smokeless tobacco. He reports that he does not drink alcohol or use drugs.  Allergies:  Allergies  Allergen Reactions  . Aspirin Anaphylaxis  . Bee Venom Anaphylaxis  . Penicillins Anaphylaxis and Other (See Comments)    Has patient had a PCN reaction causing immediate rash, facial/tongue/throat swelling, SOB or lightheadedness with hypotension: Yes Has patient had a PCN reaction causing severe rash involving mucus membranes or skin necrosis: No Has patient had a PCN reaction that required hospitalization No Has patient had a PCN reaction occurring within the last 10 years: Yes If all of the above answers are "NO", then may proceed with Cephalosporin use.  . Prednisone Anaphylaxis  . Sulfa Antibiotics Anaphylaxis  . Sulfasalazine Anaphylaxis  .  Theophyllines Anaphylaxis  . Theophylline Swelling    Medications Prior to Admission  Medication Sig Dispense Refill  . abacavir-dolutegravir-lamiVUDine (TRIUMEQ) 600-50-300 MG tablet Take 1 tablet by mouth daily after breakfast. Reported on 01/30/2016 30 tablet 0  . acetaminophen (TYLENOL) 325 MG tablet Take 1 tablet (325 mg total) by mouth every 6 (six) hours as needed for mild pain (or Fever >/= 101).    Marland Kitchen albuterol (PROVENTIL HFA;VENTOLIN HFA) 108 (90 Base) MCG/ACT inhaler Inhale 2 puffs into the lungs every 6 (six) hours as needed for wheezing or shortness of breath. 1 Inhaler 1  . albuterol (PROVENTIL) (2.5 MG/3ML) 0.083% nebulizer solution Take  2.5 mg by nebulization 3 (three) times daily.     Marland Kitchen amiodarone (PACERONE) 400 MG tablet Take 1 tablet (400 mg total) by mouth daily. 30 tablet 0  . atorvastatin (LIPITOR) 10 MG tablet Take 1 tablet (10 mg total) by mouth daily after breakfast. 30 tablet 0  . Calcium Carbonate-Vitamin D (CALCIUM 600+D) 600-400 MG-UNIT tablet Take 1 tablet by mouth daily after breakfast. 30 tablet 0  . cetirizine (ZYRTEC) 10 MG tablet Take 1 tablet (10 mg total) by mouth daily as needed for allergies. 15 tablet 0  . cyclobenzaprine (FLEXERIL) 5 MG tablet Take 1 tablet (5 mg total) by mouth 3 (three) times daily as needed for muscle spasms. 15 tablet 0  . docusate sodium (COLACE) 50 MG capsule Take 50 mg by mouth daily as needed for mild constipation.     Marland Kitchen EPINEPHrine (EPIPEN 2-PAK) 0.3 mg/0.3 mL IJ SOAJ injection Inject 0.3 mg into the muscle once as needed. For anaphylaxis    . escitalopram (LEXAPRO) 20 MG tablet Take 1 tablet (20 mg total) by mouth daily after breakfast. 30 tablet 0  . fondaparinux (ARIXTRA) 7.5 MG/0.6ML SOLN injection Inject 0.6 mLs (7.5 mg total) into the skin daily. 30 Syringe 2  . HYDROcodone-acetaminophen (NORCO) 5-325 MG tablet Take 1 tablet by mouth every 6 (six) hours as needed for moderate pain. 12 tablet 0  . levothyroxine (SYNTHROID, LEVOTHROID) 50 MCG tablet Take 1 tablet (50 mcg total) by mouth daily before breakfast. 30 tablet 0  . linaclotide (LINZESS) 72 MCG capsule Take 72 mcg by mouth daily after breakfast.    . Magnesium Oxide 500 MG TABS Take 1 tablet (500 mg total) by mouth daily after breakfast. 30 tablet 0  . meloxicam (MOBIC) 7.5 MG tablet Take 1 tablet (7.5 mg total) by mouth daily as needed for pain. 15 tablet 0  . mirtazapine (REMERON) 15 MG tablet Take 1 tablet (15 mg total) by mouth at bedtime. 30 tablet 0  . mometasone-formoterol (DULERA) 200-5 MCG/ACT AERO Inhale 2 puffs into the lungs 2 (two) times daily. 1 Inhaler 1  . Multiple Vitamin (MULTIVITAMIN WITH MINERALS)  TABS tablet Take 1 tablet by mouth daily.     . nitroGLYCERIN (NITROSTAT) 0.4 MG SL tablet Place 0.4 mg under the tongue every 5 (five) minutes as needed for chest pain.    Marland Kitchen omeprazole (PRILOSEC) 40 MG capsule Take 1 capsule (40 mg total) by mouth daily after breakfast. 30 capsule 0  . pregabalin (LYRICA) 75 MG capsule Take 1 capsule (75 mg total) by mouth 2 (two) times daily. 30 capsule 0  . sennosides-docusate sodium (SENOKOT-S) 8.6-50 MG tablet Take 1 tablet by mouth at bedtime as needed for constipation.    Marland Kitchen tiotropium (SPIRIVA) 18 MCG inhalation capsule Place 1 capsule (18 mcg total) into inhaler and inhale daily. 30 capsule 1  .  polyethylene glycol (MIRALAX / GLYCOLAX) packet Take 17 g by mouth daily as needed for mild constipation. (Patient not taking: Reported on 05/30/2017) 14 each 0  . zolpidem (AMBIEN) 5 MG tablet Take 1 tablet (5 mg total) by mouth at bedtime. (Patient not taking: Reported on 06/07/2017) 14 tablet 0    Results for orders placed or performed during the hospital encounter of 06/10/17 (from the past 48 hour(s))  Lipase, blood     Status: None   Collection Time: 06/10/17  4:18 PM  Result Value Ref Range   Lipase 19 11 - 51 U/L  Comprehensive metabolic panel     Status: Abnormal   Collection Time: 06/10/17  4:18 PM  Result Value Ref Range   Sodium 140 135 - 145 mmol/L   Potassium 3.6 3.5 - 5.1 mmol/L   Chloride 109 101 - 111 mmol/L   CO2 21 (L) 22 - 32 mmol/L   Glucose, Bld 114 (H) 65 - 99 mg/dL   BUN 22 (H) 6 - 20 mg/dL   Creatinine, Ser 1.76 (H) 0.61 - 1.24 mg/dL   Calcium 8.4 (L) 8.9 - 10.3 mg/dL   Total Protein 6.8 6.5 - 8.1 g/dL   Albumin 2.1 (L) 3.5 - 5.0 g/dL   AST 35 15 - 41 U/L   ALT 25 17 - 63 U/L   Alkaline Phosphatase 200 (H) 38 - 126 U/L   Total Bilirubin 0.7 0.3 - 1.2 mg/dL   GFR calc non Af Amer 40 (L) >60 mL/min   GFR calc Af Amer 47 (L) >60 mL/min    Comment: (NOTE) The eGFR has been calculated using the CKD EPI equation. This calculation  has not been validated in all clinical situations. eGFR's persistently <60 mL/min signify possible Chronic Kidney Disease.    Anion gap 10 5 - 15  CBC     Status: Abnormal   Collection Time: 06/10/17  4:18 PM  Result Value Ref Range   WBC 5.3 3.8 - 10.6 K/uL   RBC 3.13 (L) 4.40 - 5.90 MIL/uL   Hemoglobin 10.6 (L) 13.0 - 18.0 g/dL   HCT 32.4 (L) 40.0 - 52.0 %   MCV 103.7 (H) 80.0 - 100.0 fL   MCH 33.9 26.0 - 34.0 pg   MCHC 32.7 32.0 - 36.0 g/dL   RDW 17.7 (H) 11.5 - 14.5 %   Platelets 191 150 - 440 K/uL  Troponin I     Status: None   Collection Time: 06/10/17  4:18 PM  Result Value Ref Range   Troponin I <0.03 <0.03 ng/mL  Urinalysis, Complete w Microscopic     Status: Abnormal   Collection Time: 06/11/17  2:07 AM  Result Value Ref Range   Color, Urine YELLOW (A) YELLOW   APPearance HAZY (A) CLEAR   Specific Gravity, Urine 1.015 1.005 - 1.030   pH 5.0 5.0 - 8.0   Glucose, UA NEGATIVE NEGATIVE mg/dL   Hgb urine dipstick SMALL (A) NEGATIVE   Bilirubin Urine NEGATIVE NEGATIVE   Ketones, ur NEGATIVE NEGATIVE mg/dL   Protein, ur 100 (A) NEGATIVE mg/dL   Nitrite NEGATIVE NEGATIVE   Leukocytes, UA NEGATIVE NEGATIVE   RBC / HPF 6-30 0 - 5 RBC/hpf   WBC, UA 6-30 0 - 5 WBC/hpf   Bacteria, UA RARE (A) NONE SEEN   Squamous Epithelial / LPF 0-5 (A) NONE SEEN  Protime-INR     Status: Abnormal   Collection Time: 06/11/17  3:57 AM  Result Value Ref Range  Prothrombin Time 16.8 (H) 11.4 - 15.2 seconds   INR 1.37   TSH     Status: Abnormal   Collection Time: 06/11/17  3:57 AM  Result Value Ref Range   TSH 9.022 (H) 0.350 - 4.500 uIU/mL    Comment: Performed by a 3rd Generation assay with a functional sensitivity of <=0.01 uIU/mL.   No results found.  Review of Systems  Constitutional: Negative for chills and fever.  HENT: Negative for sore throat and tinnitus.   Eyes: Negative for blurred vision and redness.  Respiratory: Negative for cough and shortness of breath.    Cardiovascular: Negative for chest pain, palpitations, orthopnea and PND.  Gastrointestinal: Positive for abdominal pain, nausea and vomiting. Negative for diarrhea.  Genitourinary: Negative for dysuria, frequency and urgency.  Musculoskeletal: Negative for joint pain and myalgias.  Skin: Negative for rash.       No lesions  Neurological: Negative for speech change, focal weakness and weakness.  Endo/Heme/Allergies: Does not bruise/bleed easily.       No temperature intolerance  Psychiatric/Behavioral: Negative for depression and suicidal ideas.    Blood pressure 124/84, pulse 66, temperature 98.1 F (36.7 C), temperature source Oral, resp. rate 16, height '5\' 5"'$  (1.651 m), weight 78.9 kg (174 lb), SpO2 100 %. Physical Exam  Vitals reviewed. Constitutional: He is oriented to person, place, and time. He appears well-developed and well-nourished. No distress.  HENT:  Head: Normocephalic and atraumatic.  Mouth/Throat: Oropharynx is clear and moist.  Eyes: Pupils are equal, round, and reactive to light. Conjunctivae and EOM are normal. No scleral icterus.  Neck: Normal range of motion. Neck supple. No JVD present. No tracheal deviation present. No thyromegaly present.  Cardiovascular: Normal rate, regular rhythm and normal heart sounds.  Exam reveals no gallop and no friction rub.   No murmur heard. Respiratory: Effort normal and breath sounds normal. No respiratory distress.  GI: Soft. Bowel sounds are normal. He exhibits no distension. There is no tenderness.  Genitourinary:  Genitourinary Comments: Deferred  Musculoskeletal: Normal range of motion. He exhibits no edema.  Lymphadenopathy:    He has no cervical adenopathy.  Neurological: He is alert and oriented to person, place, and time. No cranial nerve deficit.  Skin: Skin is warm and dry. No rash noted. No erythema.  Psychiatric: He has a normal mood and affect. His behavior is normal. Judgment and thought content normal.      Assessment/Plan This is a 61 year old male admitted for hematemesis. 1.  Hematemesis: Unspecified volume; recurrent.  The patient is hemodynamically stable.  Continue pantoprazole and octreotide; consult gastroenterology. 2.  CKD: Stage III; avoid nephrotoxic agents.  Hydrate with intravenous fluid. 3.  Hypothyroidism: Elevated TSH; increase Synthroid. 4.  HIV: With dementia; continue Triumeq.  Remeron for dementia 5.  CAD: Stable; continue amiodarone for history of arrhythmia.  Nitroglycerin as needed 6.  COPD: Continue Spiriva and inhaled corticosteroid.  Albuterol as needed. 7.  Depression: Continue Lexapro 8.  DVT prophylaxis: SCDs 9.  GI prophylaxis: As above The patient is a DNR.  Time spent on admission orders and patient care proximally 45 minutes.  Harrie Foreman, MD 06/11/2017, 5:59 AM

## 2017-06-11 NOTE — Progress Notes (Signed)
Dose of lamivudine decreased to 150mg  due to renal function per protocol  Brian Hampton, Pharm.D, BCPS Clinical Pharmacist

## 2017-06-11 NOTE — Consult Note (Signed)
GI Inpatient Consult Note Jamey Reas, M.D.  Reason for Consult: Hemetemesis   Attending Requesting Consult: Auburn Bilberry, M.D.  Outpatient Primary Physician: Beverely Risen, M.D.  History of Present Illness: Brian Hampton is a 61 y.o. male with medical history of COPD, CAD s/p MI, HIV w/ dementia ,seen in [facility] ED yesterday for AKI and discharged. Patient reported back a few hours later same day to c/o "vomiting blood" at home, though no witness present. Patient has failed to have any observed vomiting here at the hospital.  Patient code status is DNR.  Currently, patient denies pain, SOB, nausea or vomiting. He denies significant abdominal pain. No diarrhea or constipation presently.   Past Medical History:  Past Medical History:  Diagnosis Date  . Anemia   . Asthma   . Cataracts, bilateral    worse in Rt eye  . Collagen vascular disease (HCC)   . COPD (chronic obstructive pulmonary disease) (HCC)   . Coronary artery disease   . Depression   . DVT (deep venous thrombosis) (HCC)   . Dysrhythmia   . Emphysema/COPD (HCC)   . Environmental and seasonal allergies   . Family history of adverse reaction to anesthesia    sister PONV  . GERD (gastroesophageal reflux disease)   . H/O blood clots   . Heart murmur   . HIV (human immunodeficiency virus infection) (HCC)   . Hypertension   . Leaky heart valve    x 3  . Lung mass   . Myocardial infarction (HCC)    in 2000  . Pneumonia    year ago  . Pulmonary emboli (HCC)   . Type 2 diabetes mellitus (HCC)     Problem List: Patient Active Problem List   Diagnosis Date Noted  . Hematemesis 06/11/2017  . AKI (acute kidney injury) (HCC) 06/07/2017  . Avascular necrosis of bones of both hips (HCC) 05/04/2017  . 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 Falls 05/02/2017  . Acute deep vein thrombosis (DVT) of femoral vein of right lower extremity (HCC) 04/04/2017  . Left leg pain 03/22/2017  . DVT (deep venous thrombosis) (HCC)  03/12/2017  . Acute deep vein thrombosis (DVT) of right lower extremity (HCC) 02/25/2017  . Postoperative pain   . COPD exacerbation (HCC) 12/23/2016  . Abdominal pain   . Compression fracture of body of thoracic vertebra (HCC) 12/21/2016  . Hypomagnesemia 05/05/2016  . Rhabdomyolysis 05/05/2016  . Acute pulmonary embolism (HCC) 03/17/2016  . Right leg DVT (HCC) 03/17/2016  . Hypokalemia 03/17/2016  . S/P IVC filter 03/17/2016  . SOB (shortness of breath) 03/15/2016  . Dysphagia 02/02/2016  . GERD (gastroesophageal reflux disease) 02/02/2016  . Hyperthyroidism 01/31/2016  . 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 Elevated transaminase level 01/31/2016  . Anemia 01/31/2016  . Thrombocytopenia (HCC) 01/31/2016  . Pyuria 01/31/2016  . Weakness 01/29/2016  . HIV (human immunodeficiency virus infection) (HCC) 01/29/2016  . CAD (coronary artery disease) 01/29/2016  . COPD (chronic obstructive pulmonary disease) (HCC) 01/29/2016  . Diabetes mellitus (HCC) 01/29/2016  . Leg weakness, bilateral 01/13/2016  . Chest pain 06/24/2015    Past Surgical History: Past Surgical History:  Procedure Laterality Date  . ANKLE ARTHROSCOPY    . BACK SURGERY    . CARDIAC CATHETERIZATION    . IVC FILTER INSERTION    . KYPHOPLASTY N/A 12/21/2016   Procedure: KYPHOPLASTY;  Surgeon: Kennedy Bucker, MD;  Location: [facility] ORS;  Service: Orthopedics;  Laterality: N/A;  . PERIPHERAL VASCULAR CATHETERIZATION N/A  03/16/2016   Procedure: IVC Filter Insertion;  Surgeon: Renford Dills, MD;  Location: [facility] INVASIVE CV LAB;  Service: Cardiovascular;  Laterality: N/A;  . SINUS EXPLORATION    . TONSILLECTOMY    . WRIST ARTHROSCOPY Right     Allergies: Allergies  Allergen Reactions  . Aspirin Anaphylaxis  . Bee Venom Anaphylaxis  . Penicillins Anaphylaxis and Other (See Comments)    Has patient had a PCN reaction causing immediate rash, facial/tongue/throat swelling, SOB or lightheadedness with hypotension:  Yes Has patient had a PCN reaction causing severe rash involving mucus membranes or skin necrosis: No Has patient had a PCN reaction that required hospitalization No Has patient had a PCN reaction occurring within the last 10 years: Yes If all of the above answers are "NO", then may proceed with Cephalosporin use.  . Prednisone Anaphylaxis  . Sulfa Antibiotics Anaphylaxis  . Sulfasalazine Anaphylaxis  . Theophyllines Anaphylaxis  . Theophylline Swelling    Home Medications: Prescriptions Prior to Admission  Medication Sig Dispense Refill Last Dose  . abacavir-dolutegravir-lamiVUDine (TRIUMEQ) 600-50-300 MG tablet Take 1 tablet by mouth daily after breakfast. Reported on 01/30/2016 30 tablet 0   . acetaminophen (TYLENOL) 325 MG tablet Take 1 tablet (325 mg total) by mouth every 6 (six) hours as needed for mild pain (or Fever >/= 101).   prn at prn  . albuterol (PROVENTIL HFA;VENTOLIN HFA) 108 (90 Base) MCG/ACT inhaler Inhale 2 puffs into the lungs every 6 (six) hours as needed for wheezing or shortness of breath. 1 Inhaler 1   . albuterol (PROVENTIL) (2.5 MG/3ML) 0.083% nebulizer solution Take 2.5 mg by nebulization 3 (three) times daily.    prn at prn  . amiodarone (PACERONE) 400 MG tablet Take 1 tablet (400 mg total) by mouth daily. 30 tablet 0   . atorvastatin (LIPITOR) 10 MG tablet Take 1 tablet (10 mg total) by mouth daily after breakfast. 30 tablet 0   . Calcium Carbonate-Vitamin D (CALCIUM 600+D) 600-400 MG-UNIT tablet Take 1 tablet by mouth daily after breakfast. 30 tablet 0   . cetirizine (ZYRTEC) 10 MG tablet Take 1 tablet (10 mg total) by mouth daily as needed for allergies. 15 tablet 0   . cyclobenzaprine (FLEXERIL) 5 MG tablet Take 1 tablet (5 mg total) by mouth 3 (three) times daily as needed for muscle spasms. 15 tablet 0   . docusate sodium (COLACE) 50 MG capsule Take 50 mg by mouth daily as needed for mild constipation.    prn at prn  . EPINEPHrine (EPIPEN 2-PAK) 0.3 mg/0.3 mL  IJ SOAJ injection Inject 0.3 mg into the muscle once as needed. For anaphylaxis   prn at prn  . escitalopram (LEXAPRO) 20 MG tablet Take 1 tablet (20 mg total) by mouth daily after breakfast. 30 tablet 0   . fondaparinux (ARIXTRA) 7.5 MG/0.6ML SOLN injection Inject 0.6 mLs (7.5 mg total) into the skin daily. 30 Syringe 2   . HYDROcodone-acetaminophen (NORCO) 5-325 MG tablet Take 1 tablet by mouth every 6 (six) hours as needed for moderate pain. 12 tablet 0 prn at prn  . levothyroxine (SYNTHROID, LEVOTHROID) 50 MCG tablet Take 1 tablet (50 mcg total) by mouth daily before breakfast. 30 tablet 0   . linaclotide (LINZESS) 72 MCG capsule Take 72 mcg by mouth daily after breakfast.   06/07/2017 at 0800  . Magnesium Oxide 500 MG TABS Take 1 tablet (500 mg total) by mouth daily after breakfast. 30 tablet 0   . meloxicam (MOBIC) 7.5  MG tablet Take 1 tablet (7.5 mg total) by mouth daily as needed for pain. 15 tablet 0   . mirtazapine (REMERON) 15 MG tablet Take 1 tablet (15 mg total) by mouth at bedtime. 30 tablet 0   . mometasone-formoterol (DULERA) 200-5 MCG/ACT AERO Inhale 2 puffs into the lungs 2 (two) times daily. 1 Inhaler 1   . Multiple Vitamin (MULTIVITAMIN WITH MINERALS) TABS tablet Take 1 tablet by mouth daily.    06/07/2017 at Unknown time  . nitroGLYCERIN (NITROSTAT) 0.4 MG SL tablet Place 0.4 mg under the tongue every 5 (five) minutes as needed for chest pain.   prn at prn  . omeprazole (PRILOSEC) 40 MG capsule Take 1 capsule (40 mg total) by mouth daily after breakfast. 30 capsule 0   . pregabalin (LYRICA) 75 MG capsule Take 1 capsule (75 mg total) by mouth 2 (two) times daily. 30 capsule 0   . sennosides-docusate sodium (SENOKOT-S) 8.6-50 MG tablet Take 1 tablet by mouth at bedtime as needed for constipation.   prn at prn  . tiotropium (SPIRIVA) 18 MCG inhalation capsule Place 1 capsule (18 mcg total) into inhaler and inhale daily. 30 capsule 1   . polyethylene glycol (MIRALAX / GLYCOLAX)  packet Take 17 g by mouth daily as needed for mild constipation. (Patient not taking: Reported on 05/30/2017) 14 each 0 Not Taking at Unknown time  . zolpidem (AMBIEN) 5 MG tablet Take 1 tablet (5 mg total) by mouth at bedtime. (Patient not taking: Reported on 06/07/2017) 14 tablet 0 Not Taking at Unknown time   Home medication reconciliation was completed with the patient.   Scheduled Inpatient Medications:   . [START ON 06/12/2017] abacavir  600 mg Oral Daily   And  . [START ON 06/12/2017] dolutegravir  50 mg Oral Daily   And  . [START ON 06/12/2017] lamiVUDine  150 mg Oral Daily  . albuterol  2.5 mg Nebulization TID  . amiodarone  400 mg Oral Daily  . atorvastatin  10 mg Oral QPC breakfast  . calcium-vitamin D  1 tablet Oral QPC breakfast  . docusate sodium  100 mg Oral BID  . escitalopram  20 mg Oral QPC breakfast  . levothyroxine  50 mcg Oral QAC breakfast  . linaclotide  72 mcg Oral QPC breakfast  . loratadine  10 mg Oral Daily  . magnesium oxide  400 mg Oral Daily  . mirtazapine  15 mg Oral QHS  . mometasone-formoterol  2 puff Inhalation BID  . multivitamin with minerals  1 tablet Oral Daily  . octreotide  50 mcg Intravenous Once  . pantoprazole  40 mg Oral Daily  . pregabalin  75 mg Oral BID  . tiotropium  18 mcg Inhalation Daily  . zolpidem  5 mg Oral QHS    Continuous Inpatient Infusions:   . sodium chloride 125 mL/hr at 06/11/17 1230  . octreotide  (SANDOSTATIN)    IV infusion 50 mcg/hr (06/11/17 0446)    PRN Inpatient Medications:  acetaminophen **OR** acetaminophen, cyclobenzaprine, HYDROcodone-acetaminophen, nitroGLYCERIN, ondansetron **OR** ondansetron (ZOFRAN) IV, polyethylene glycol, prochlorperazine  Family History: family history includes Asthma in his mother; CAD in his unknown relative; Cirrhosis in his father.   GI Family History: Negative  Social History:   reports that he quit smoking about 15 years ago. He has never used smokeless tobacco. He  reports that he does not drink alcohol or use drugs. The patient denies ETOH, tobacco, or drug use.   Review of Systems  Constitutional:  Positive for appetite change and fatigue. Negative for unexpected weight change.  HENT: Positive for facial swelling. Negative for congestion, dental problem and trouble swallowing.   Eyes: Negative.   Respiratory: Negative.   Cardiovascular: Positive for palpitations and leg swelling. Negative for chest pain.  Neurological: Positive for dizziness.    Review of Systems: Review of Systems  Constitutional: Positive for appetite change and fatigue. Negative for unexpected weight change.  HENT: Positive for facial swelling. Negative for congestion, dental problem and trouble swallowing.   Eyes: Negative.   Respiratory: Negative.   Cardiovascular: Positive for palpitations and leg swelling. Negative for chest pain.  Allergic/Immunologic: Negative.   Neurological: Positive for dizziness.    Physical Examination: BP 110/60 (BP Location: Right Arm)   Pulse 67   Temp 98.1 F (36.7 C) (Oral)   Resp 16   Ht 5\' 5"  (1.651 m)   Wt 77.1 kg (170 lb)   SpO2 100%   BMI 28.29 kg/m  Gen: Alert, appears oriented. HEENT:[place]/AT. PERRLA Neck: Supple, trachea midline Chest: CTA bilaterally CV: RR nl S1, S2 Abd: soft mildly distended. BS+. Non-tender Ext: No edema. Skin:+tattoos, no rashes.  Data: Lab Results  Component Value Date   WBC 5.3 06/10/2017   HGB 10.6 (L) 06/10/2017   HCT 32.4 (L) 06/10/2017   MCV 103.7 (H) 06/10/2017   PLT 191 06/10/2017    Recent Labs Lab 06/09/17 0248 06/10/17 0415 06/10/17 1618  HGB 8.2* 8.5* 10.6*   Lab Results  Component Value Date   NA 140 06/10/2017   K 3.6 06/10/2017   CL 109 06/10/2017   CO2 21 (L) 06/10/2017   BUN 22 (H) 06/10/2017   CREATININE 1.76 (H) 06/10/2017   Lab Results  Component Value Date   ALT 25 06/10/2017   AST 35 06/10/2017   ALKPHOS 200 (H) 06/10/2017   BILITOT 0.7 06/10/2017     Recent Labs Lab 06/10/17 1223 06/11/17 0357  APTT 44*  --   INR  --  1.37   CBC Latest Ref Rng & Units 06/10/2017 06/10/2017 06/09/2017  WBC 3.8 - 10.6 K/uL 5.3 4.8 6.3  Hemoglobin 13.0 - 18.0 g/dL 10.6(L) 8.5(L) 8.2(L)  Hematocrit 40.0 - 52.0 % 32.4(L) 25.5(L) 24.7(L)  Platelets 150 - 440 K/uL 191 147(L) 161    STUDIES: No results found. @IMAGES @  Assessment: 1. Hemetemesis - Currently no witnessed vomiting or nausea. Appears stable. Would prefer to treat empirically and avoid procedures given multiple comorbidities.  2. Mild anemia - Stable.  3. HIV w/dementia.  4. CAD  5. COPD.  Will see peripherally. Agree with acid suppression empirically.   Recommendations:as above.  Thank you for the consult. Please call with questions or concerns.  Rosina Lowenstein, MD  06/11/2017 1:32 PM

## 2017-06-11 NOTE — Progress Notes (Signed)
[employer] -  at [facility]                                                                                                                                                                                  Patient Demographics   Brian Hampton, is a 61 y.o. male, DOB - March 01, 1956, RUE:[id number]  Admit date - 06/10/2017   Admitting Physician Arnaldo Natal, MD  Outpatient Primary MD for the patient is Lyndon Code, MD   LOS - 0  Subjective: Patient just got discharged earlier in the day yesterday.  Came back with hematemesis Patient denies any further hematemesis states that he is hungry    Review of Systems:   CONSTITUTIONAL: No documented fever. No fatigue, weakness. No weight gain, no weight loss.  EYES: No blurry or double vision.  ENT: No tinnitus. No postnasal drip. No redness of the oropharynx.  RESPIRATORY: No cough, no wheeze, no hemoptysis. No dyspnea.  CARDIOVASCULAR: No chest pain. No orthopnea. No palpitations. No syncope.  GASTROINTESTINAL: No nausea, no vomiting or diarrhea. No abdominal pain. No melena or hematochezia.  Positive hematemesis GENITOURINARY: No dysuria or hematuria.  ENDOCRINE: No polyuria or nocturia. No heat or cold intolerance.  HEMATOLOGY: No anemia. No bruising. No bleeding.  INTEGUMENTARY: No rashes. No lesions.  MUSCULOSKELETAL: No arthritis. No swelling. No gout.  NEUROLOGIC: No numbness, tingling, or ataxia. No seizure-type activity.  PSYCHIATRIC: No anxiety. No insomnia. No ADD.    Vitals:   Vitals:   06/11/17 0151 06/11/17 0309 06/11/17 0333 06/11/17 1028  BP: 103/64 95/66 124/84 110/60  Pulse: 66 66 66 67  Resp: 20 19 16    Temp:   98.1 F (36.7 C)   TempSrc:   Oral   SpO2: 99% 100% 100%   Weight:   170 lb (77.1 kg)   Height:   5\' 5"  (1.651 m)     Wt Readings from Last 3 Encounters:  06/11/17 170 lb (77.1 kg)  06/10/17 174 lb 6.4 oz (79.1 kg)  05/30/17 180 lb (81.6 kg)     Intake/Output Summary  (Last 24 hours) at 06/11/17 1309 Last data filed at 06/11/17 0752  Gross per 24 hour  Intake              349 ml  Output              320 ml  Net               29 ml    Physical Exam:   GENERAL: Pleasant-appearing in no apparent distress.  HEAD, EYES, EARS, NOSE AND THROAT: Atraumatic, normocephalic. Extraocular muscles are intact. Pupils equal and reactive  to light. Sclerae anicteric. No conjunctival injection. No oro-pharyngeal erythema.  NECK: Supple. There is no jugular venous distention. No bruits, no lymphadenopathy, no thyromegaly.  HEART: Regular rate and rhythm,. No murmurs, no rubs, no clicks.  LUNGS: Clear to auscultation bilaterally. No rales or rhonchi. No wheezes.  ABDOMEN: Soft, flat, nontender, nondistended. Has good bowel sounds. No hepatosplenomegaly appreciated.  EXTREMITIES: No evidence of any cyanosis, clubbing, or peripheral edema.  +2 pedal and radial pulses bilaterally.  NEUROLOGIC: The patient is alert, awake, and oriented x3 with no focal motor or sensory deficits appreciated bilaterally.  SKIN: Moist and warm with no rashes appreciated.  Psych: Not anxious, depressed LN: No inguinal LN enlargement    Antibiotics   Anti-infectives    Start     Dose/Rate Route Frequency Ordered Stop   06/12/17 1000  abacavir (ZIAGEN) tablet 600 mg     600 mg Oral Daily 06/11/17 1304     06/12/17 1000  dolutegravir (TIVICAY) tablet 50 mg     50 mg Oral Daily 06/11/17 1304     06/12/17 1000  lamiVUDine (EPIVIR) tablet 150 mg     150 mg Oral Daily 06/11/17 1304     06/11/17 0900  abacavir-dolutegravir-lamiVUDine (TRIUMEQ) 600-50-300 MG per tablet 1 tablet  Status:  Discontinued    Comments:  Reported on 01/30/2016     1 tablet Oral Daily after breakfast 06/11/17 0332 06/11/17 1304   06/10/17 2330  ciprofloxacin (CIPRO) IVPB 400 mg     400 mg 200 mL/hr over 60 Minutes Intravenous  Once 06/10/17 2323 06/11/17 0259      Medications   Scheduled Meds: . [START ON  06/12/2017] abacavir  600 mg Oral Daily   And  . [START ON 06/12/2017] dolutegravir  50 mg Oral Daily   And  . [START ON 06/12/2017] lamiVUDine  150 mg Oral Daily  . albuterol  2.5 mg Nebulization TID  . amiodarone  400 mg Oral Daily  . atorvastatin  10 mg Oral QPC breakfast  . calcium-vitamin D  1 tablet Oral QPC breakfast  . docusate sodium  100 mg Oral BID  . escitalopram  20 mg Oral QPC breakfast  . levothyroxine  50 mcg Oral QAC breakfast  . linaclotide  72 mcg Oral QPC breakfast  . loratadine  10 mg Oral Daily  . magnesium oxide  400 mg Oral Daily  . mirtazapine  15 mg Oral QHS  . mometasone-formoterol  2 puff Inhalation BID  . multivitamin with minerals  1 tablet Oral Daily  . octreotide  50 mcg Intravenous Once  . pantoprazole  40 mg Oral Daily  . pregabalin  75 mg Oral BID  . tiotropium  18 mcg Inhalation Daily  . zolpidem  5 mg Oral QHS   Continuous Infusions: . sodium chloride 125 mL/hr at 06/11/17 1230  . octreotide  (SANDOSTATIN)    IV infusion 50 mcg/hr (06/11/17 0446)   PRN Meds:.acetaminophen **OR** acetaminophen, cyclobenzaprine, HYDROcodone-acetaminophen, nitroGLYCERIN, ondansetron **OR** ondansetron (ZOFRAN) IV, polyethylene glycol, prochlorperazine   Data Review:   Micro Results No results found for this or any previous visit (from the past 240 hour(s)).  Radiology Reports Dg Chest 2 View  Result Date: 06/07/2017 CLINICAL DATA:  Shortness of breath, abdominal pain, and chest pain. EXAM: CHEST  2 VIEW COMPARISON:  05/02/2017. FINDINGS: Low lung volumes. Stable cardiomediastinal silhouette. Prior vertebral augmentation midthoracic region. No consolidation or edema. No effusion or pneumothorax. Healing RIGHT-sided rib fractures. IMPRESSION: Healing rib fractures.  No active  disease. Electronically Signed   By: Elsie Stain M.D.   On: 06/07/2017 15:18   US Renal  Result Date: 06/07/2017 CLINICAL DATA:  Acute renal insufficiency EXAM: RENAL / URINARY TRACT  ULTRASOUND COMPLETE COMPARISON:  CT abdomen and pelvis May 02, 2017 FINDINGS: Right Kidney: Length: 9.5 cm. Echogenicity and renal cortical thickness are within normal limits. No mass or perinephric fluid visualized. There is mild fullness of the right renal collecting system and proximal ureter which persists after voiding. No sonographically demonstrable calculus evident. Mid and distal portions of ureter not visualized. Left Kidney: Length: 10.3 cm. Echogenicity and renal cortical thickness are within normal limits. No mass or perinephric fluid visualized. There is mild fullness of the left renal collecting system and proximal ureter which persists after voiding. No sonographically demonstrable calculus evident. Mid and distal portions of ureter not visualized. Bladder: Appears normal for degree of bladder distention. Urinary bladder is diffusely distended. Prevoid urinary bladder volume is measured at 928 mL. Postvoid residual measures 890 mL. IMPRESSION: Urinary bladder distention with large postvoid residual. This finding raises concern for urinary bladder outlet obstruction. Fullness of each renal collecting system is noted with persistence after attempted voiding, likely due to stasis phenomenon from the urinary bladder distention. Study otherwise unremarkable. Electronically Signed   By: Bretta Bang III M.D.   On: 06/07/2017 16:51   US Venous Img Lower Bilateral  Result Date: 06/07/2017 CLINICAL DATA:  Bilateral lower extremity edema EXAM: BILATERAL LOWER EXTREMITY VENOUS DUPLEX ULTRASOUND TECHNIQUE: Gray-scale sonography with graded compression, as well as color Doppler and duplex ultrasound were performed to evaluate the lower extremity deep venous systems from the level of the common femoral vein and including the common femoral, femoral, profunda femoral, popliteal and calf veins including the posterior tibial, peroneal and gastrocnemius veins when visible. The superficial great  saphenous vein was also interrogated. Spectral Doppler was utilized to evaluate flow at rest and with distal augmentation maneuvers in the common femoral, femoral and popliteal veins. COMPARISON:  None. FINDINGS: RIGHT LOWER EXTREMITY Common Femoral Vein: No evidence of thrombus. Normal compressibility, respiratory phasicity and response to augmentation. Saphenofemoral Junction: No evidence of thrombus. Normal compressibility and flow on color Doppler imaging. Profunda Femoral Vein: No evidence of thrombus. Normal compressibility and flow on color Doppler imaging. Femoral Vein: There is hypoechoic thrombus throughout much of the proximal right femoral vein which is partially compressible with limited Doppler flow. There is felt to be completely obstructing acute deep venous thrombosis in this region. Popliteal Vein: No evidence of thrombus. Normal compressibility, respiratory phasicity and response to augmentation. Calf Veins: No evidence of thrombus. Normal compressibility and flow on color Doppler imaging. Superficial Great Saphenous Vein: No evidence of thrombus. Normal compressibility. Venous Reflux:  None. Other Findings:  None. LEFT LOWER EXTREMITY Common Femoral Vein: No evidence of thrombus. Normal compressibility, respiratory phasicity and response to augmentation. Saphenofemoral Junction: No evidence of thrombus. Normal compressibility and flow on color Doppler imaging. Profunda Femoral Vein: No evidence of thrombus. Normal compressibility and flow on color Doppler imaging. Femoral Vein: No evidence of thrombus. Normal compressibility, respiratory phasicity and response to augmentation. Popliteal Vein: No evidence of thrombus. Normal compressibility, respiratory phasicity and response to augmentation. Calf Veins: No evidence of thrombus. Normal compressibility and flow on color Doppler imaging. Superficial Great Saphenous Vein: No evidence of thrombus. Normal compressibility. Venous Reflux:  None. Other  Findings:  None. IMPRESSION: Evidence of acute deep venous thrombosis, incompletely obstructing, in portions of the proximal right femoral vein.  No other foci of deep venous thrombosis in either lower extremity. Electronically Signed   By: Bretta Bang III M.D.   On: 06/07/2017 17:03     CBC  Recent Labs Lab 06/07/17 1416 06/08/17 0038 06/09/17 0248 06/10/17 0415 06/10/17 1618  WBC 6.4 4.8 6.3 4.8 5.3  HGB 10.7* 8.6* 8.2* 8.5* 10.6*  HCT 33.0* 26.9* 24.7* 25.5* 32.4*  PLT 197 152 161 147* 191  MCV 104.3* 102.8* 102.7* 103.1* 103.7*  MCH 33.6 33.0 34.1* 34.2* 33.9  MCHC 32.3 32.1 33.3 33.2 32.7  RDW 17.9* 17.3* 17.7* 17.3* 17.7*  LYMPHSABS 1.5  --   --   --   --   MONOABS 0.5  --   --   --   --   EOSABS 0.0  --   --   --   --   BASOSABS 0.0  --   --   --   --     Chemistries   Recent Labs Lab 06/07/17 1416 06/08/17 0038 06/09/17 0248 06/10/17 0415 06/10/17 1618  NA 142 145  --   --  140  K 5.4* 3.8  --   --  3.6  CL 111 114*  --   --  109  CO2 19* 25  --   --  21*  GLUCOSE 99 108*  --   --  114*  BUN 64* 57*  --   --  22*  CREATININE 5.27* 4.31* 2.48* 1.82* 1.76*  CALCIUM 8.4* 8.0*  --   --  8.4*  AST 41  --   --   --  35  ALT 20  --   --   --  25  ALKPHOS 239*  --   --   --  200*  BILITOT 0.9  --   --   --  0.7   ------------------------------------------------------------------------------------------------------------------ estimated creatinine clearance is 42.7 mL/min (A) (by C-G formula based on SCr of 1.76 mg/dL (H)). ------------------------------------------------------------------------------------------------------------------ No results for input(s): HGBA1C in the last 72 hours. ------------------------------------------------------------------------------------------------------------------ No results for input(s): CHOL, HDL, LDLCALC, TRIG, CHOLHDL, LDLDIRECT in the last 72  hours. ------------------------------------------------------------------------------------------------------------------  Recent Labs  06/11/17 0357  TSH 9.022*   ------------------------------------------------------------------------------------------------------------------  Recent Labs  06/10/17 0415  VITAMINB12 211  FOLATE 8.8    Coagulation profile  Recent Labs Lab 06/07/17 1423 06/11/17 0357  INR 1.43 1.37    No results for input(s): DDIMER in the last 72 hours.  Cardiac Enzymes  Recent Labs Lab 06/07/17 1234 06/10/17 1618  TROPONINI <0.03 <0.03   ------------------------------------------------------------------------------------------------------------------ Invalid input(s): POCBNP    Assessment & Plan   This is a 61 year old male admitted for hematemesis. 1.  Hematemesis:  Hemoglobin stable await GI evaluation continue octreotide and PPI 2.  CKD: Stage III; avoid nephrotoxic agents.    Renal function stable recent admission for acute renal failure 3.  Hypothyroidism: Elevated TSH; Synthroid has been increased 4.  HIV: With dementia; continue Triumeq.  Remeron for dementia 5.  CAD: Stable; continue amiodarone for history of arrhythmia.  Nitroglycerin as needed-currently asymptomatic 6.  COPD: Continue Spiriva and inhaled corticosteroid.  Albuterol as needed. 7.  Depression: Continue Lexapro 8.  DVT prophylaxis: SCDs 9.  GI prophylaxis: As above     Code Status Orders        Start     Ordered   06/11/17 0333  Do not attempt resuscitation (DNR)  Continuous    Question Answer Comment  In the event of cardiac or respiratory ARREST Do  not call a "code blue"   In the event of cardiac or respiratory ARREST Do not perform Intubation, CPR, defibrillation or ACLS   In the event of cardiac or respiratory ARREST Use medication by any route, position, wound care, and other measures to relive pain and suffering. May use oxygen, suction and manual  treatment of airway obstruction as needed for comfort.      06/11/17 0332    Code Status History    Date Active Date Inactive Code Status Order ID Comments User Context   06/07/2017  3:44 PM 06/10/2017  4:09 PM DNR [id number]  Milagros LollSudini, Srikar, MD ED   05/02/2017  8:23 PM 05/06/2017 10:01 PM Full Code [id number]  Milagros LollSudini, Srikar, MD ED   03/22/2017 11:30 PM 03/23/2017  7:12 PM Full Code [id number]  Oralia ManisWillis, David, MD ED   03/12/2017  8:23 AM 03/15/2017  6:42 PM Full Code [id number]  Ihor AustinPyreddy, Pavan, MD Inpatient   03/04/2017  4:05 AM 03/05/2017  6:33 PM Full Code [id number]  Arnaldo Nataliamond, Michael S, MD Inpatient   02/25/2017 11:52 PM 02/27/2017  6:06 PM DNR [id number]  Tonye RoyaltyHugelmeyer, Alexis, DO Inpatient   02/25/2017 10:27 PM 02/25/2017 11:52 PM Full Code [id number]  Hugelmeyer, Jon GillsAlexis, DO Inpatient   12/21/2016  6:14 PM 12/24/2016  3:03 PM DNR [id number]  Kennedy BuckerMenz, Michael, MD Inpatient   05/05/2016 10:17 PM 05/12/2016  6:20 PM DNR [id number]  Altamese DillingVachhani, Vaibhavkumar, MD Inpatient   03/15/2016  5:42 PM 03/17/2016  6:59 PM DNR [id number]  Auburn BilberryPatel, Manus Weedman, MD ED   03/15/2016  5:29 PM 03/15/2016  5:42 PM Full Code [id number]  Auburn BilberryPatel, Tanette Chauca, MD ED   01/30/2016 12:55 AM 02/03/2016 12:55 AM DNR [id number]  Gery Prayrosley, Debby, MD Inpatient   01/13/2016  9:46 PM 01/16/2016  4:18 PM Full Code [id number]  Shaune Pollackhen, Qing, MD Inpatient   06/24/2015  7:13 AM 06/25/2015 12:27 PM Full Code [id number]  Milagros LollSudini, Srikar, MD ED    Advance Directive Documentation     Most Recent Value  Type of Advance Directive  Healthcare Power of Attorney, Living will  Pre-existing out of facility DNR order (yellow form or pink MOST form)  -  "MOST" Form in Place?  -           Consults gi   DVT Prophylaxis scd's  Lab Results  Component Value Date   PLT 191 06/10/2017     Time Spent in minutes  35min Greater than 50% of time spent in care coordination and counseling patient regarding the condition and plan of care.   Auburn BilberryPATEL, Thurley Francesconi M.D on 06/11/2017 at 1:09 PM  Between  7am to 6pm - Pager - [phone]  After 6pm go to [email or web address] - password [email or web address]  [facility]   Office  [phone]

## 2017-06-12 ENCOUNTER — Other Ambulatory Visit: Payer: Self-pay

## 2017-06-12 DIAGNOSIS — N183 Chronic kidney disease, stage 3 (moderate): Secondary | ICD-10-CM | POA: Diagnosis not present

## 2017-06-12 DIAGNOSIS — B20 Human immunodeficiency virus [HIV] disease: Secondary | ICD-10-CM | POA: Diagnosis not present

## 2017-06-12 DIAGNOSIS — E039 Hypothyroidism, unspecified: Secondary | ICD-10-CM | POA: Diagnosis not present

## 2017-06-12 DIAGNOSIS — K920 Hematemesis: Secondary | ICD-10-CM | POA: Diagnosis not present

## 2017-06-12 LAB — BASIC METABOLIC PANEL
ANION GAP: 6 (ref 5–15)
BUN: 12 mg/dL (ref 6–20)
CALCIUM: 7.5 mg/dL — AB (ref 8.9–10.3)
CO2: 24 mmol/L (ref 22–32)
Chloride: 113 mmol/L — ABNORMAL HIGH (ref 101–111)
Creatinine, Ser: 1.55 mg/dL — ABNORMAL HIGH (ref 0.61–1.24)
GFR calc Af Amer: 54 mL/min — ABNORMAL LOW (ref >60)
GFR calc non Af Amer: 47 mL/min — ABNORMAL LOW (ref >60)
GLUCOSE: 139 mg/dL — AB (ref 65–99)
POTASSIUM: 3.3 mmol/L — AB (ref 3.5–5.1)
Sodium: 143 mmol/L (ref 135–145)

## 2017-06-12 LAB — CBC
HEMATOCRIT: 24.9 % — AB (ref 40.0–52.0)
Hemoglobin: 8 g/dL — ABNORMAL LOW (ref 13.0–18.0)
MCH: 33.5 pg (ref 26.0–34.0)
MCHC: 31.9 g/dL — ABNORMAL LOW (ref 32.0–36.0)
MCV: 104.9 fL — AB (ref 80.0–100.0)
Platelets: 141 10*3/uL — ABNORMAL LOW (ref 150–440)
RBC: 2.38 MIL/uL — ABNORMAL LOW (ref 4.40–5.90)
RDW: 17.8 % — AB (ref 11.5–14.5)
WBC: 4.1 10*3/uL (ref 3.8–10.6)

## 2017-06-12 MED ORDER — POTASSIUM CHLORIDE CRYS ER 20 MEQ PO TBCR
20.0000 meq | EXTENDED_RELEASE_TABLET | Freq: Once | ORAL | Status: AC
  Administered 2017-06-12: 20 meq via ORAL
  Filled 2017-06-12: qty 1

## 2017-06-12 MED ORDER — POTASSIUM CHLORIDE CRYS ER 20 MEQ PO TBCR
40.0000 meq | EXTENDED_RELEASE_TABLET | Freq: Once | ORAL | Status: AC
  Administered 2017-06-12: 40 meq via ORAL
  Filled 2017-06-12: qty 2

## 2017-06-12 NOTE — Progress Notes (Deleted)
Chart error  [employer] CompanyJuan RN

## 2017-06-12 NOTE — Care Management Obs Status (Addendum)
MEDICARE OBSERVATION STATUS NOTIFICATION   Patient Details  Name: Brian Hampton MRN: [id number] Date of Birth: 02/09/1956   Medicare Observation Status Notification Given:  Yes  Given on 06/11/17 by e-signature.    Ciji Boston A, RN 06/12/2017, 10:35 AM

## 2017-06-12 NOTE — Progress Notes (Signed)
[employer] - [place] at [facility]                                                                                                                                                                                  Patient Demographics   Brian Hampton, is a 61 y.o. male, DOB - October 24, 1955, ZOX:[id number]  Admit date - 06/10/2017   Admitting Physician Arnaldo Natal, MD  Outpatient Primary MD for the patient is Lyndon Code, MD   LOS - 0  Subjective: According to him no further hematemesis Hemoglobin is lower today Still on a clear liquid diet wants his diet advanced states that he is very hungry    Review of Systems:   CONSTITUTIONAL: No documented fever. No fatigue, weakness. No weight gain, no weight loss.  EYES: No blurry or double vision.  ENT: No tinnitus. No postnasal drip. No redness of the oropharynx.  RESPIRATORY: No cough, no wheeze, no hemoptysis. No dyspnea.  CARDIOVASCULAR: No chest pain. No orthopnea. No palpitations. No syncope.  GASTROINTESTINAL: No nausea, no vomiting or diarrhea. No abdominal pain. No melena or hematochezia.    GENITOURINARY: No dysuria or hematuria.  ENDOCRINE: No polyuria or nocturia. No heat or cold intolerance.  HEMATOLOGY: No anemia. No bruising. No bleeding.  INTEGUMENTARY: No rashes. No lesions.  MUSCULOSKELETAL: No arthritis. No swelling. No gout.  NEUROLOGIC: No numbness, tingling, or ataxia. No seizure-type activity.  PSYCHIATRIC: No anxiety. No insomnia. No ADD.    Vitals:   Vitals:   06/12/17 0456 06/12/17 0500 06/12/17 0943 06/12/17 1315  BP: 136/67  115/65 109/66  Pulse: 83  75 79  Resp: 18   18  Temp: 98.3 F (36.8 C)   98.4 F (36.9 C)  TempSrc: Oral   Oral  SpO2: 100%   98%  Weight:  175 lb 8 oz (79.6 kg)    Height:        Wt Readings from Last 3 Encounters:  06/12/17 175 lb 8 oz (79.6 kg)  06/10/17 174 lb 6.4 oz (79.1 kg)  05/30/17 180 lb (81.6 kg)     Intake/Output Summary (Last 24 hours) at  06/12/2017 1345 Last data filed at 06/12/2017 1038 Gross per 24 hour  Intake 3777 ml  Output 1650 ml  Net 2127 ml    Physical Exam:   GENERAL: Pleasant-appearing in no apparent distress.  HEAD, EYES, EARS, NOSE AND THROAT: Atraumatic, normocephalic. Extraocular muscles are intact. Pupils equal and reactive to light. Sclerae anicteric. No conjunctival injection. No oro-pharyngeal erythema.  NECK: Supple. There is no jugular venous distention. No bruits, no lymphadenopathy, no thyromegaly.  HEART: Regular rate and rhythm,. No murmurs, no rubs,  no clicks.  LUNGS: Clear to auscultation bilaterally. No rales or rhonchi. No wheezes.  ABDOMEN: Soft, flat, nontender, nondistended. Has good bowel sounds. No hepatosplenomegaly appreciated.  EXTREMITIES: No evidence of any cyanosis, clubbing, or peripheral edema.  +2 pedal and radial pulses bilaterally.  NEUROLOGIC: The patient is alert, awake, and oriented x3 with no focal motor or sensory deficits appreciated bilaterally.  SKIN: Moist and warm with no rashes appreciated.  Psych: Not anxious, depressed LN: No inguinal LN enlargement    Antibiotics   Anti-infectives (From admission, onward)   Start     Dose/Rate Route Frequency Ordered Stop   06/12/17 1000  abacavir (ZIAGEN) tablet 600 mg     600 mg Oral Daily 06/11/17 1304     06/12/17 1000  dolutegravir (TIVICAY) tablet 50 mg     50 mg Oral Daily 06/11/17 1304     06/12/17 1000  lamiVUDine (EPIVIR) tablet 150 mg     150 mg Oral Daily 06/11/17 1304     06/11/17 0900  abacavir-dolutegravir-lamiVUDine (TRIUMEQ) 600-50-300 MG per tablet 1 tablet  Status:  Discontinued    Comments:  Reported on 01/30/2016     1 tablet Oral Daily after breakfast 06/11/17 0332 06/11/17 1304   06/10/17 2330  ciprofloxacin (CIPRO) IVPB 400 mg     400 mg 200 mL/hr over 60 Minutes Intravenous  Once 06/10/17 2323 06/11/17 0259      Medications   Scheduled Meds: . abacavir  600 mg Oral Daily   And  .  dolutegravir  50 mg Oral Daily   And  . lamiVUDine  150 mg Oral Daily  . albuterol  2.5 mg Nebulization BID  . amiodarone  400 mg Oral Daily  . atorvastatin  10 mg Oral QPC breakfast  . calcium-vitamin D  1 tablet Oral QPC breakfast  . docusate sodium  100 mg Oral BID  . escitalopram  20 mg Oral QPC breakfast  . levothyroxine  50 mcg Oral QAC breakfast  . linaclotide  72 mcg Oral QPC breakfast  . loratadine  10 mg Oral Daily  . magnesium oxide  400 mg Oral Daily  . mirtazapine  15 mg Oral QHS  . mometasone-formoterol  2 puff Inhalation BID  . multivitamin with minerals  1 tablet Oral Daily  . pantoprazole  40 mg Oral Daily  . pregabalin  75 mg Oral BID  . tiotropium  18 mcg Inhalation Daily  . zolpidem  5 mg Oral QHS   Continuous Infusions:  PRN Meds:.acetaminophen **OR** acetaminophen, cyclobenzaprine, HYDROcodone-acetaminophen, nitroGLYCERIN, ondansetron **OR** ondansetron (ZOFRAN) IV, polyethylene glycol, prochlorperazine   Data Review:   Micro Results No results found for this or any previous visit (from the past 240 hour(s)).  Radiology Reports Dg Chest 2 View  Result Date: 06/07/2017 CLINICAL DATA:  Shortness of breath, abdominal pain, and chest pain. EXAM: CHEST  2 VIEW COMPARISON:  05/02/2017. FINDINGS: Low lung volumes. Stable cardiomediastinal silhouette. Prior vertebral augmentation midthoracic region. No consolidation or edema. No effusion or pneumothorax. Healing RIGHT-sided rib fractures. IMPRESSION: Healing rib fractures.  No active disease. Electronically Signed   By: Elsie Stain M.D.   On: 06/07/2017 15:18   US Renal  Result Date: 06/07/2017 CLINICAL DATA:  Acute renal insufficiency EXAM: RENAL / URINARY TRACT ULTRASOUND COMPLETE COMPARISON:  CT abdomen and pelvis May 02, 2017 FINDINGS: Right Kidney: Length: 9.5 cm. Echogenicity and renal cortical thickness are within normal limits. No mass or perinephric fluid visualized. There is mild fullness of the  right renal collecting system and proximal ureter which persists after voiding. No sonographically demonstrable calculus evident. Mid and distal portions of ureter not visualized. Left Kidney: Length: 10.3 cm. Echogenicity and renal cortical thickness are within normal limits. No mass or perinephric fluid visualized. There is mild fullness of the left renal collecting system and proximal ureter which persists after voiding. No sonographically demonstrable calculus evident. Mid and distal portions of ureter not visualized. Bladder: Appears normal for degree of bladder distention. Urinary bladder is diffusely distended. Prevoid urinary bladder volume is measured at 928 mL. Postvoid residual measures 890 mL. IMPRESSION: Urinary bladder distention with large postvoid residual. This finding raises concern for urinary bladder outlet obstruction. Fullness of each renal collecting system is noted with persistence after attempted voiding, likely due to stasis phenomenon from the urinary bladder distention. Study otherwise unremarkable. Electronically Signed   By: Bretta Bang III M.D.   On: 06/07/2017 16:51   US Venous Img Lower Bilateral  Result Date: 06/07/2017 CLINICAL DATA:  Bilateral lower extremity edema EXAM: BILATERAL LOWER EXTREMITY VENOUS DUPLEX ULTRASOUND TECHNIQUE: Gray-scale sonography with graded compression, as well as color Doppler and duplex ultrasound were performed to evaluate the lower extremity deep venous systems from the level of the common femoral vein and including the common femoral, femoral, profunda femoral, popliteal and calf veins including the posterior tibial, peroneal and gastrocnemius veins when visible. The superficial great saphenous vein was also interrogated. Spectral Doppler was utilized to evaluate flow at rest and with distal augmentation maneuvers in the common femoral, femoral and popliteal veins. COMPARISON:  None. FINDINGS: RIGHT LOWER EXTREMITY Common Femoral Vein: No  evidence of thrombus. Normal compressibility, respiratory phasicity and response to augmentation. Saphenofemoral Junction: No evidence of thrombus. Normal compressibility and flow on color Doppler imaging. Profunda Femoral Vein: No evidence of thrombus. Normal compressibility and flow on color Doppler imaging. Femoral Vein: There is hypoechoic thrombus throughout much of the proximal right femoral vein which is partially compressible with limited Doppler flow. There is felt to be completely obstructing acute deep venous thrombosis in this region. Popliteal Vein: No evidence of thrombus. Normal compressibility, respiratory phasicity and response to augmentation. Calf Veins: No evidence of thrombus. Normal compressibility and flow on color Doppler imaging. Superficial Great Saphenous Vein: No evidence of thrombus. Normal compressibility. Venous Reflux:  None. Other Findings:  None. LEFT LOWER EXTREMITY Common Femoral Vein: No evidence of thrombus. Normal compressibility, respiratory phasicity and response to augmentation. Saphenofemoral Junction: No evidence of thrombus. Normal compressibility and flow on color Doppler imaging. Profunda Femoral Vein: No evidence of thrombus. Normal compressibility and flow on color Doppler imaging. Femoral Vein: No evidence of thrombus. Normal compressibility, respiratory phasicity and response to augmentation. Popliteal Vein: No evidence of thrombus. Normal compressibility, respiratory phasicity and response to augmentation. Calf Veins: No evidence of thrombus. Normal compressibility and flow on color Doppler imaging. Superficial Great Saphenous Vein: No evidence of thrombus. Normal compressibility. Venous Reflux:  None. Other Findings:  None. IMPRESSION: Evidence of acute deep venous thrombosis, incompletely obstructing, in portions of the proximal right femoral vein. No other foci of deep venous thrombosis in either lower extremity. Electronically Signed   By: Bretta Bang III  M.D.   On: 06/07/2017 17:03     CBC Recent Labs  Lab 06/07/17 1416 06/08/17 0038 06/09/17 0248 06/10/17 0415 06/10/17 1618 06/12/17 0443  WBC 6.4 4.8 6.3 4.8 5.3 4.1  HGB 10.7* 8.6* 8.2* 8.5* 10.6* 8.0*  HCT 33.0* 26.9* 24.7* 25.5* 32.4* 24.9*  PLT  197 152 161 147* 191 141*  MCV 104.3* 102.8* 102.7* 103.1* 103.7* 104.9*  MCH 33.6 33.0 34.1* 34.2* 33.9 33.5  MCHC 32.3 32.1 33.3 33.2 32.7 31.9*  RDW 17.9* 17.3* 17.7* 17.3* 17.7* 17.8*  LYMPHSABS 1.5  --   --   --   --   --   MONOABS 0.5  --   --   --   --   --   EOSABS 0.0  --   --   --   --   --   BASOSABS 0.0  --   --   --   --   --     Chemistries  Recent Labs  Lab 06/07/17 1416 06/08/17 0038 06/09/17 0248 06/10/17 0415 06/10/17 1618 06/12/17 0443  NA 142 145  --   --  140 143  K 5.4* 3.8  --   --  3.6 3.3*  CL 111 114*  --   --  109 113*  CO2 19* 25  --   --  21* 24  GLUCOSE 99 108*  --   --  114* 139*  BUN 64* 57*  --   --  22* 12  CREATININE 5.27* 4.31* 2.48* 1.82* 1.76* 1.55*  CALCIUM 8.4* 8.0*  --   --  8.4* 7.5*  AST 41  --   --   --  35  --   ALT 20  --   --   --  25  --   ALKPHOS 239*  --   --   --  200*  --   BILITOT 0.9  --   --   --  0.7  --    ------------------------------------------------------------------------------------------------------------------ estimated creatinine clearance is 49.2 mL/min (A) (by C-G formula based on SCr of 1.55 mg/dL (H)). ------------------------------------------------------------------------------------------------------------------ No results for input(s): HGBA1C in the last 72 hours. ------------------------------------------------------------------------------------------------------------------ No results for input(s): CHOL, HDL, LDLCALC, TRIG, CHOLHDL, LDLDIRECT in the last 72 hours. ------------------------------------------------------------------------------------------------------------------ Recent Labs    06/11/17 0357  TSH 9.022*    ------------------------------------------------------------------------------------------------------------------ Recent Labs    06/10/17 0415  VITAMINB12 211  FOLATE 8.8    Coagulation profile Recent Labs  Lab 06/07/17 1423 06/11/17 0357  INR 1.43 1.37    No results for input(s): DDIMER in the last 72 hours.  Cardiac Enzymes Recent Labs  Lab 06/07/17 1234 06/10/17 1618  TROPONINI <0.03 <0.03   ------------------------------------------------------------------------------------------------------------------ Invalid input(s): POCBNP    Assessment & Plan   This is a 61 year old male admitted for hematemesis. 1.  Hematemesis:  Hemoglobin did drop further compared to yesterday could be due to IV hydration Monitor H&H tomorrow Advance diet Discontinue octreotide 2.  CKD: Stage III; avoid nephrotoxic agents.    Renal function stable recent admission for acute renal failure 3.  Hypothyroidism: Elevated TSH; Synthroid has been increased 4.  HIV: With dementia; continue Triumeq.  Remeron for dementia 5.  CAD: Stable; continue amiodarone for history of arrhythmia.  Nitroglycerin as needed-currently asymptomatic 6.  COPD: Continue Spiriva and inhaled corticosteroid.  Albuterol as needed. 7.  Depression: Continue Lexapro 8.  DVT prophylaxis: SCDs 9.  GI prophylaxis: As above     Code Status Orders        Start     Ordered   06/11/17 0333  Do not attempt resuscitation (DNR)  Continuous    Question Answer Comment  In the event of cardiac or respiratory ARREST Do not call a "code blue"   In the event of cardiac or respiratory ARREST Do  not perform Intubation, CPR, defibrillation or ACLS   In the event of cardiac or respiratory ARREST Use medication by any route, position, wound care, and other measures to relive pain and suffering. May use oxygen, suction and manual treatment of airway obstruction as needed for comfort.      06/11/17 0332    Code Status History     Date Active Date Inactive Code Status Order ID Comments User Context   06/07/2017  3:44 PM 06/10/2017  4:09 PM DNR [id number]  Milagros LollSudini, Srikar, MD ED   05/02/2017  8:23 PM 05/06/2017 10:01 PM Full Code [id number]  Milagros LollSudini, Srikar, MD ED   03/22/2017 11:30 PM 03/23/2017  7:12 PM Full Code [id number]  Oralia ManisWillis, David, MD ED   03/12/2017  8:23 AM 03/15/2017  6:42 PM Full Code [id number]  Ihor AustinPyreddy, Pavan, MD Inpatient   03/04/2017  4:05 AM 03/05/2017  6:33 PM Full Code [id number]  Arnaldo Nataliamond, Michael S, MD Inpatient   02/25/2017 11:52 PM 02/27/2017  6:06 PM DNR [id number]  Tonye RoyaltyHugelmeyer, Alexis, DO Inpatient   02/25/2017 10:27 PM 02/25/2017 11:52 PM Full Code [id number]  Hugelmeyer, Jon GillsAlexis, DO Inpatient   12/21/2016  6:14 PM 12/24/2016  3:03 PM DNR [id number]  Kennedy BuckerMenz, Michael, MD Inpatient   05/05/2016 10:17 PM 05/12/2016  6:20 PM DNR [id number]  Altamese DillingVachhani, Vaibhavkumar, MD Inpatient   03/15/2016  5:42 PM 03/17/2016  6:59 PM DNR [id number]  Auburn BilberryPatel, Lio Wehrly, MD ED   03/15/2016  5:29 PM 03/15/2016  5:42 PM Full Code [id number]  Auburn BilberryPatel, Hildegard Hlavac, MD ED   01/30/2016 12:55 AM 02/03/2016 12:55 AM DNR [id number]  Gery Prayrosley, Debby, MD Inpatient   01/13/2016  9:46 PM 01/16/2016  4:18 PM Full Code [id number]  Shaune Pollackhen, Qing, MD Inpatient   06/24/2015  7:13 AM 06/25/2015 12:27 PM Full Code [id number]  Milagros LollSudini, Srikar, MD ED    Advance Directive Documentation     Most Recent Value  Type of Advance Directive  Healthcare Power of Attorney, Living will  Pre-existing out of facility DNR order (yellow form or pink MOST form)  -  "MOST" Form in Place?  -           Consults gi   DVT Prophylaxis scd's  Lab Results  Component Value Date   PLT 141 (L) 06/12/2017     Time Spent in minutes  32min Greater than 50% of time spent in care coordination and counseling patient regarding the condition and plan of care.   Auburn BilberryPATEL, Estanislao Harmon M.D on 06/12/2017 at 1:45 PM  Between 7am to 6pm - Pager - [phone]  After 6pm go to [email or web address] - password [email or web address] [facility]  [facility]   Office  [phone]

## 2017-06-13 DIAGNOSIS — J449 Chronic obstructive pulmonary disease, unspecified: Secondary | ICD-10-CM | POA: Diagnosis not present

## 2017-06-13 DIAGNOSIS — R140 Abdominal distension (gaseous): Secondary | ICD-10-CM | POA: Diagnosis not present

## 2017-06-13 DIAGNOSIS — N183 Chronic kidney disease, stage 3 (moderate): Secondary | ICD-10-CM | POA: Diagnosis not present

## 2017-06-13 DIAGNOSIS — I313 Pericardial effusion (noninflammatory): Secondary | ICD-10-CM | POA: Diagnosis not present

## 2017-06-13 DIAGNOSIS — J9811 Atelectasis: Secondary | ICD-10-CM | POA: Diagnosis not present

## 2017-06-13 DIAGNOSIS — R531 Weakness: Secondary | ICD-10-CM | POA: Diagnosis not present

## 2017-06-13 DIAGNOSIS — Z86711 Personal history of pulmonary embolism: Secondary | ICD-10-CM | POA: Diagnosis not present

## 2017-06-13 DIAGNOSIS — R109 Unspecified abdominal pain: Secondary | ICD-10-CM | POA: Diagnosis not present

## 2017-06-13 DIAGNOSIS — K920 Hematemesis: Secondary | ICD-10-CM | POA: Diagnosis not present

## 2017-06-13 DIAGNOSIS — R1084 Generalized abdominal pain: Secondary | ICD-10-CM | POA: Diagnosis not present

## 2017-06-13 DIAGNOSIS — N136 Pyonephrosis: Secondary | ICD-10-CM | POA: Diagnosis not present

## 2017-06-13 DIAGNOSIS — J690 Pneumonitis due to inhalation of food and vomit: Secondary | ICD-10-CM | POA: Diagnosis not present

## 2017-06-13 DIAGNOSIS — G1229 Other motor neuron disease: Secondary | ICD-10-CM | POA: Diagnosis not present

## 2017-06-13 DIAGNOSIS — B20 Human immunodeficiency virus [HIV] disease: Secondary | ICD-10-CM | POA: Diagnosis not present

## 2017-06-13 DIAGNOSIS — R918 Other nonspecific abnormal finding of lung field: Secondary | ICD-10-CM | POA: Diagnosis not present

## 2017-06-13 DIAGNOSIS — N151 Renal and perinephric abscess: Secondary | ICD-10-CM | POA: Diagnosis not present

## 2017-06-13 DIAGNOSIS — E039 Hypothyroidism, unspecified: Secondary | ICD-10-CM | POA: Diagnosis not present

## 2017-06-13 DIAGNOSIS — J90 Pleural effusion, not elsewhere classified: Secondary | ICD-10-CM | POA: Diagnosis not present

## 2017-06-13 DIAGNOSIS — E43 Unspecified severe protein-calorie malnutrition: Secondary | ICD-10-CM | POA: Diagnosis not present

## 2017-06-13 LAB — CBC WITH DIFFERENTIAL/PLATELET
BASOS ABS: 0 10*3/uL (ref 0–0.1)
BASOS PCT: 0 %
Eosinophils Absolute: 0.1 10*3/uL (ref 0–0.7)
Eosinophils Relative: 2 %
HEMATOCRIT: 25.3 % — AB (ref 40.0–52.0)
HEMOGLOBIN: 8.1 g/dL — AB (ref 13.0–18.0)
Lymphocytes Relative: 26 %
Lymphs Abs: 1.1 10*3/uL (ref 1.0–3.6)
MCH: 33.5 pg (ref 26.0–34.0)
MCHC: 32 g/dL (ref 32.0–36.0)
MCV: 104.7 fL — ABNORMAL HIGH (ref 80.0–100.0)
MONO ABS: 0.4 10*3/uL (ref 0.2–1.0)
Monocytes Relative: 10 %
NEUTROS ABS: 2.6 10*3/uL (ref 1.4–6.5)
NEUTROS PCT: 62 %
Platelets: 154 10*3/uL (ref 150–440)
RBC: 2.42 MIL/uL — AB (ref 4.40–5.90)
RDW: 18.4 % — AB (ref 11.5–14.5)
WBC: 4.2 10*3/uL (ref 3.8–10.6)

## 2017-06-13 NOTE — Discharge Summary (Signed)
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 F/u urology as out pt  SECONDARY DIAGNOSIS:   Past Medical History:  Diagnosis Date  . Anemia   . Asthma   . Cataracts, bilateral    worse in Rt eye  . Collagen vascular disease (HCC)   . COPD (chronic obstructive pulmonary disease) (HCC)   . Coronary artery disease   . Depression   . DVT (deep venous thrombosis) (HCC)   . Dysrhythmia   . Emphysema/COPD (HCC)   . Environmental and seasonal allergies   . Family history of adverse reaction to anesthesia    sister PONV  . GERD (gastroesophageal reflux disease)   . H/O blood clots   . Heart murmur   . HIV (human immunodeficiency virus infection) (HCC)   . Hypertension   . Leaky heart valve    x 3  . Lung mass   . Myocardial infarction (HCC)    in 2000  . Pneumonia    year ago  . Pulmonary emboli (HCC)   . Type 2 diabetes mellitus [facility])     HOSPITAL COURSE:   61 year old male admitted for hematemesis. 1. Hematemesis:  Hemoglobin stable  - GI evaluation appreciated. No witnessed heamatemesis her -cont PPI -no GI w/u recommended -hgb stable  2. CKD: Stage III; avoid nephrotoxic agents.   Renal function stable recent admission for acute renal failure  3. Hypothyroidism: Elevated TSH; Synthroid has been increased  4. HIV: With dementia; continue Triumeq. Remeron for dementia  5. CAD: Stable; continue amiodarone for history of arrhythmia. Nitroglycerin as needed-currently asymptomatic  6. COPD: Continue Spiriva and inhaled  corticosteroid. Albuterol as needed.  7. Depression: Continue Lexapro  8. DVT prophylaxis: SCDs  Overall at baseline D/c home with resumption of HHPT   CONSULTS OBTAINED:  Treatment Team:  Enedina Finner, MD  DRUG ALLERGIES:   Allergies  Allergen Reactions  . Aspirin Anaphylaxis  . Bee Venom Anaphylaxis  . Penicillins Anaphylaxis and Other (See Comments)    Has patient had a PCN reaction causing immediate rash, facial/tongue/throat swelling, SOB or lightheadedness with hypotension: Yes Has patient had a PCN reaction causing severe rash involving mucus membranes or skin necrosis: No Has patient had a PCN reaction that required hospitalization No Has patient had a PCN reaction occurring within the last 10 years: Yes If all of the above answers are "NO", then may proceed with Cephalosporin use.  . Prednisone Anaphylaxis  . Sulfa Antibiotics Anaphylaxis  . Sulfasalazine Anaphylaxis  . Theophyllines Anaphylaxis  . Theophylline Swelling    DISCHARGE MEDICATIONS:   Current Discharge Medication List    CONTINUE these medications which have NOT CHANGED   Details  abacavir-dolutegravir-lamiVUDine (TRIUMEQ) 600-50-300 MG tablet Take 1 tablet by mouth daily after breakfast. Reported on 01/30/2016 Qty: 30 tablet, Refills: 0    acetaminophen (TYLENOL) 325 MG tablet Take 1 tablet (325 mg total) by mouth every 6 (six) hours as needed for mild pain (or Fever >/= 101).    albuterol (PROVENTIL HFA;VENTOLIN HFA) 108 (90 Base) MCG/ACT inhaler Inhale 2 puffs into  the lungs every 6 (six) hours as needed for wheezing or shortness of breath. Qty: 1 Inhaler, Refills: 1    albuterol (PROVENTIL) (2.5 MG/3ML) 0.083% nebulizer solution Take 2.5 mg by nebulization 3 (three) times daily.     amiodarone (PACERONE) 400 MG tablet Take 1 tablet (400 mg total) by mouth daily. Qty: 30 tablet, Refills: 0    atorvastatin (LIPITOR) 10 MG tablet Take 1 tablet (10 mg total) by mouth daily after  breakfast. Qty: 30 tablet, Refills: 0    Calcium Carbonate-Vitamin D (CALCIUM 600+D) 600-400 MG-UNIT tablet Take 1 tablet by mouth daily after breakfast. Qty: 30 tablet, Refills: 0    cetirizine (ZYRTEC) 10 MG tablet Take 1 tablet (10 mg total) by mouth daily as needed for allergies. Qty: 15 tablet, Refills: 0    cyclobenzaprine (FLEXERIL) 5 MG tablet Take 1 tablet (5 mg total) by mouth 3 (three) times daily as needed for muscle spasms. Qty: 15 tablet, Refills: 0   Associated Diagnoses: Acute deep vein thrombosis (DVT) of femoral vein of right lower extremity (HCC)    docusate sodium (COLACE) 50 MG capsule Take 50 mg by mouth daily as needed for mild constipation.     EPINEPHrine (EPIPEN 2-PAK) 0.3 mg/0.3 mL IJ SOAJ injection Inject 0.3 mg into the muscle once as needed. For anaphylaxis    escitalopram (LEXAPRO) 20 MG tablet Take 1 tablet (20 mg total) by mouth daily after breakfast. Qty: 30 tablet, Refills: 0    fondaparinux (ARIXTRA) 7.5 MG/0.6ML SOLN injection Inject 0.6 mLs (7.5 mg total) into the skin daily. Qty: 30 Syringe, Refills: 2   Associated Diagnoses: Acute deep vein thrombosis (DVT) of femoral vein of right lower extremity (HCC)    HYDROcodone-acetaminophen (NORCO) 5-325 MG tablet Take 1 tablet by mouth every 6 (six) hours as needed for moderate pain. Qty: 12 tablet, Refills: 0   Associated Diagnoses: Acute deep vein thrombosis (DVT) of femoral vein of right lower extremity (HCC)    levothyroxine (SYNTHROID, LEVOTHROID) 50 MCG tablet Take 1 tablet (50 mcg total) by mouth daily before breakfast. Qty: 30 tablet, Refills: 0    linaclotide (LINZESS) 72 MCG capsule Take 72 mcg by mouth daily after breakfast.    Magnesium Oxide 500 MG TABS Take 1 tablet (500 mg total) by mouth daily after breakfast. Qty: 30 tablet, Refills: 0    meloxicam (MOBIC) 7.5 MG tablet Take 1 tablet (7.5 mg total) by mouth daily as needed for pain. Qty: 15 tablet, Refills: 0    mirtazapine  (REMERON) 15 MG tablet Take 1 tablet (15 mg total) by mouth at bedtime. Qty: 30 tablet, Refills: 0    mometasone-formoterol (DULERA) 200-5 MCG/ACT AERO Inhale 2 puffs into the lungs 2 (two) times daily. Qty: 1 Inhaler, Refills: 1    Multiple Vitamin (MULTIVITAMIN WITH MINERALS) TABS tablet Take 1 tablet by mouth daily.     nitroGLYCERIN (NITROSTAT) 0.4 MG SL tablet Place 0.4 mg under the tongue every 5 (five) minutes as needed for chest pain.    omeprazole (PRILOSEC) 40 MG capsule Take 1 capsule (40 mg total) by mouth daily after breakfast. Qty: 30 capsule, Refills: 0    pregabalin (LYRICA) 75 MG capsule Take 1 capsule (75 mg total) by mouth 2 (two) times daily. Qty: 30 capsule, Refills: 0    sennosides-docusate sodium (SENOKOT-S) 8.6-50 MG tablet Take 1 tablet by mouth at bedtime as needed for constipation.    tiotropium (SPIRIVA) 18 MCG inhalation capsule Place 1 capsule (18 mcg total) into  inhaler and inhale daily. Qty: 30 capsule, Refills: 1    polyethylene glycol (MIRALAX / GLYCOLAX) packet Take 17 g by mouth daily as needed for mild constipation. Qty: 14 each, Refills: 0    zolpidem (AMBIEN) 5 MG tablet Take 1 tablet (5 mg total) by mouth at bedtime. Qty: 14 tablet, Refills: 0        If you experience worsening of your admission symptoms, develop shortness of breath, life threatening emergency, suicidal or homicidal thoughts you must seek medical attention immediately by calling 911 or calling your MD immediately  if symptoms less severe.  You Must read complete instructions/literature along with all the possible adverse reactions/side effects for all the Medicines you take and that have been prescribed to you. Take any new Medicines after you have completely understood and accept all the possible adverse reactions/side effects.   Please note  You were cared for by a hospitalist during your hospital stay. If you have any questions about your discharge medications or the care  you received while you were in the hospital after you are discharged, you can call the unit and asked to speak with the hospitalist on call if the hospitalist that took care of you is not available. Once you are discharged, your primary care physician will handle any further medical issues. Please note that NO REFILLS for any discharge medications will be authorized once you are discharged, as it is imperative that you return to your primary care physician (or establish a relationship with a primary care physician if you do not have one) for your aftercare needs so that they can reassess your need for medications and monitor your lab values. Today   SUBJECTIVE   Doing well No emesis  VITAL SIGNS:  Blood pressure 119/71, pulse 81, temperature 98.5 F (36.9 C), temperature source Oral, resp. rate 18, height 5\' 5"  (1.651 m), weight 78.4 kg (172 lb 12.8 oz), SpO2 99 %.  I/O:    Intake/Output Summary (Last 24 hours) at 06/13/2017 1305 Last data filed at 06/13/2017 1019 Gross per 24 hour  Intake 980 ml  Output 1750 ml  Net -770 ml    PHYSICAL EXAMINATION:  GENERAL:  61 y.o.-year-old patient lying in the bed with no acute distress.  EYES: Pupils equal, round, reactive to light and accommodation. No scleral icterus. Extraocular muscles intact.  HEENT: Head atraumatic, normocephalic. Oropharynx and nasopharynx clear.  NECK:  Supple, no jugular venous distention. No thyroid enlargement, no tenderness.  LUNGS: Normal breath sounds bilaterally, no wheezing, rales,rhonchi or crepitation. No use of accessory muscles of respiration.  CARDIOVASCULAR: S1, S2 normal. No murmurs, rubs, or gallops.  ABDOMEN: Soft, non-tender, non-distended. Bowel sounds present. No organomegaly or mass.foley + chronic  EXTREMITIES: No pedal edema, cyanosis, or clubbing.  NEUROLOGIC: Cranial nerves II through XII are intact. Muscle strength 5/5 in all extremities. Sensation intact. Gait not checked.  PSYCHIATRIC: The  patient is alert and oriented x 3.  SKIN: No obvious rash, lesion, or ulcer.   DATA REVIEW:   CBC  Recent Labs  Lab 06/13/17 0425  WBC 4.2  HGB 8.1*  HCT 25.3*  PLT 154    Chemistries  Recent Labs  Lab 06/10/17 1618 06/12/17 0443  NA 140 143  K 3.6 3.3*  CL 109 113*  CO2 21* 24  GLUCOSE 114* 139*  BUN 22* 12  CREATININE 1.76* 1.55*  CALCIUM 8.4* 7.5*  AST 35  --   ALT 25  --   ALKPHOS 200*  --  BILITOT 0.7  --     Microbiology Results   No results found for this or any previous visit (from the past 240 hour(s)).  RADIOLOGY:  No results found.   Management plans discussed with the patient, family and they are in agreement.  CODE STATUS:     Code Status Orders  (From admission, onward)        Start     Ordered   06/11/17 0333  Do not attempt resuscitation (DNR)  Continuous    Question Answer Comment  In the event of cardiac or respiratory ARREST Do not call a "code blue"   In the event of cardiac or respiratory ARREST Do not perform Intubation, CPR, defibrillation or ACLS   In the event of cardiac or respiratory ARREST Use medication by any route, position, wound care, and other measures to relive pain and suffering. May use oxygen, suction and manual treatment of airway obstruction as needed for comfort.      06/11/17 0332    Code Status History    Date Active Date Inactive Code Status Order ID Comments User Context   06/07/2017 15:44 06/10/2017 16:09 DNR [id number]  Milagros LollSudini, Srikar, MD ED   05/02/2017 20:23 05/06/2017 22:01 Full Code [id number]  Milagros LollSudini, Srikar, MD ED   03/22/2017 23:30 03/23/2017 19:12 Full Code [id number]  Oralia ManisWillis, David, MD ED   03/12/2017 08:23 03/15/2017 18:42 Full Code [id number]  Ihor AustinPyreddy, Pavan, MD Inpatient   03/04/2017 04:05 03/05/2017 18:33 Full Code [id number]  Arnaldo Nataliamond, Michael S, MD Inpatient   02/25/2017 23:52 02/27/2017 18:06 DNR [id number]  Tonye RoyaltyHugelmeyer, Alexis, DO Inpatient   02/25/2017 22:27 02/25/2017 23:52 Full Code [id number]   Tonye RoyaltyHugelmeyer, Alexis, DO Inpatient   12/21/2016 18:14 12/24/2016 15:03 DNR [id number]  Kennedy BuckerMenz, Michael, MD Inpatient   05/05/2016 22:17 05/12/2016 18:20 DNR [id number]  Altamese DillingVachhani, Vaibhavkumar, MD Inpatient   03/15/2016 17:42 03/17/2016 18:59 DNR [id number]  Auburn BilberryPatel, Shreyang, MD ED   03/15/2016 17:29 03/15/2016 17:42 Full Code [id number]  Auburn BilberryPatel, Shreyang, MD ED   01/30/2016 00:55 02/03/2016 00:55 DNR [id number]  Gery Prayrosley, Debby, MD Inpatient   01/13/2016 21:46 01/16/2016 16:18 Full Code [id number]  Shaune Pollackhen, Qing, MD Inpatient   06/24/2015 07:13 06/25/2015 12:27 Full Code [id number]  Milagros LollSudini, Srikar, MD ED    Advance Directive Documentation     Most Recent Value  Type of Advance Directive  Healthcare Power of Attorney, Living will  Pre-existing out of facility DNR order (yellow form or pink MOST form)  No data  "MOST" Form in Place?  No data      TOTAL TIME TAKING CARE OF THIS PATIENT: *40* minutes.    Daltin Crist M.D on 06/13/2017 at 1:05 PM  Between 7am to 6pm - Pager - [phone] After 6pm go to [email or web address] - password [email or web address]  [facility]  Office  [phone]  CC: Primary care physician; Lyndon CodeKhan, Fozia M, MD

## 2017-06-13 NOTE — Progress Notes (Signed)
IV was removed. Discharge instructions and follow-up appointments were provided to the pt. All questions answered. Foley bag switched to leg bag. Extra foley bag given to pt. Educated on how to switch the bags. The pt was taken downstairs via wheelchair by sister.

## 2017-06-13 NOTE — Care Management (Signed)
Patient to discharge home with his sister today.  Patient open with [facility] home health.  Elnita MaxwellCheryl with [facility] notified of discharge, and resumption orders have been placed. Clydie BraunKaren with [facility] notified of admission/discharge.  As referral for outpatient palliative was made previous admission. RNCM signing off.

## 2017-06-14 DIAGNOSIS — J449 Chronic obstructive pulmonary disease, unspecified: Secondary | ICD-10-CM | POA: Diagnosis present

## 2017-06-14 DIAGNOSIS — F339 Major depressive disorder, recurrent, unspecified: Secondary | ICD-10-CM | POA: Diagnosis present

## 2017-06-14 DIAGNOSIS — Z7189 Other specified counseling: Secondary | ICD-10-CM | POA: Diagnosis not present

## 2017-06-14 DIAGNOSIS — D6851 Activated protein C resistance: Secondary | ICD-10-CM | POA: Diagnosis present

## 2017-06-14 DIAGNOSIS — M549 Dorsalgia, unspecified: Secondary | ICD-10-CM | POA: Diagnosis not present

## 2017-06-14 DIAGNOSIS — G8929 Other chronic pain: Secondary | ICD-10-CM | POA: Diagnosis present

## 2017-06-14 DIAGNOSIS — N183 Chronic kidney disease, stage 3 (moderate): Secondary | ICD-10-CM | POA: Diagnosis present

## 2017-06-14 DIAGNOSIS — R0789 Other chest pain: Secondary | ICD-10-CM | POA: Diagnosis not present

## 2017-06-14 DIAGNOSIS — I300 Acute nonspecific idiopathic pericarditis: Secondary | ICD-10-CM | POA: Diagnosis not present

## 2017-06-14 DIAGNOSIS — D892 Hypergammaglobulinemia, unspecified: Secondary | ICD-10-CM | POA: Diagnosis not present

## 2017-06-14 DIAGNOSIS — R339 Retention of urine, unspecified: Secondary | ICD-10-CM | POA: Diagnosis present

## 2017-06-14 DIAGNOSIS — K920 Hematemesis: Secondary | ICD-10-CM | POA: Diagnosis not present

## 2017-06-14 DIAGNOSIS — I10 Essential (primary) hypertension: Secondary | ICD-10-CM | POA: Diagnosis not present

## 2017-06-14 DIAGNOSIS — Z23 Encounter for immunization: Secondary | ICD-10-CM | POA: Diagnosis not present

## 2017-06-14 DIAGNOSIS — D696 Thrombocytopenia, unspecified: Secondary | ICD-10-CM | POA: Diagnosis not present

## 2017-06-14 DIAGNOSIS — Z7401 Bed confinement status: Secondary | ICD-10-CM | POA: Diagnosis not present

## 2017-06-14 DIAGNOSIS — G903 Multi-system degeneration of the autonomic nervous system: Secondary | ICD-10-CM | POA: Diagnosis not present

## 2017-06-14 DIAGNOSIS — N400 Enlarged prostate without lower urinary tract symptoms: Secondary | ICD-10-CM | POA: Diagnosis not present

## 2017-06-14 DIAGNOSIS — I313 Pericardial effusion (noninflammatory): Secondary | ICD-10-CM | POA: Diagnosis not present

## 2017-06-14 DIAGNOSIS — R1084 Generalized abdominal pain: Secondary | ICD-10-CM | POA: Diagnosis not present

## 2017-06-14 DIAGNOSIS — R45851 Suicidal ideations: Secondary | ICD-10-CM | POA: Diagnosis present

## 2017-06-14 DIAGNOSIS — F321 Major depressive disorder, single episode, moderate: Secondary | ICD-10-CM | POA: Diagnosis not present

## 2017-06-14 DIAGNOSIS — I319 Disease of pericardium, unspecified: Secondary | ICD-10-CM | POA: Diagnosis not present

## 2017-06-14 DIAGNOSIS — I82403 Acute embolism and thrombosis of unspecified deep veins of lower extremity, bilateral: Secondary | ICD-10-CM | POA: Diagnosis not present

## 2017-06-14 DIAGNOSIS — G1229 Other motor neuron disease: Secondary | ICD-10-CM | POA: Diagnosis present

## 2017-06-14 DIAGNOSIS — R9389 Abnormal findings on diagnostic imaging of other specified body structures: Secondary | ICD-10-CM | POA: Diagnosis not present

## 2017-06-14 DIAGNOSIS — B9620 Unspecified Escherichia coli [E. coli] as the cause of diseases classified elsewhere: Secondary | ICD-10-CM | POA: Diagnosis not present

## 2017-06-14 DIAGNOSIS — R079 Chest pain, unspecified: Secondary | ICD-10-CM | POA: Diagnosis not present

## 2017-06-14 DIAGNOSIS — Z66 Do not resuscitate: Secondary | ICD-10-CM | POA: Diagnosis not present

## 2017-06-14 DIAGNOSIS — R509 Fever, unspecified: Secondary | ICD-10-CM | POA: Diagnosis not present

## 2017-06-14 DIAGNOSIS — R0989 Other specified symptoms and signs involving the circulatory and respiratory systems: Secondary | ICD-10-CM | POA: Diagnosis not present

## 2017-06-14 DIAGNOSIS — N136 Pyonephrosis: Secondary | ICD-10-CM | POA: Diagnosis not present

## 2017-06-14 DIAGNOSIS — M9050 Osteonecrosis in diseases classified elsewhere, unspecified site: Secondary | ICD-10-CM | POA: Diagnosis present

## 2017-06-14 DIAGNOSIS — N10 Acute tubulo-interstitial nephritis: Secondary | ICD-10-CM | POA: Diagnosis not present

## 2017-06-14 DIAGNOSIS — E039 Hypothyroidism, unspecified: Secondary | ICD-10-CM | POA: Diagnosis present

## 2017-06-14 DIAGNOSIS — G959 Disease of spinal cord, unspecified: Secondary | ICD-10-CM | POA: Diagnosis not present

## 2017-06-14 DIAGNOSIS — I2510 Atherosclerotic heart disease of native coronary artery without angina pectoris: Secondary | ICD-10-CM | POA: Diagnosis present

## 2017-06-14 DIAGNOSIS — F330 Major depressive disorder, recurrent, mild: Secondary | ICD-10-CM | POA: Diagnosis not present

## 2017-06-14 DIAGNOSIS — N179 Acute kidney failure, unspecified: Secondary | ICD-10-CM | POA: Diagnosis not present

## 2017-06-14 DIAGNOSIS — E785 Hyperlipidemia, unspecified: Secondary | ICD-10-CM | POA: Diagnosis not present

## 2017-06-14 DIAGNOSIS — R1312 Dysphagia, oropharyngeal phase: Secondary | ICD-10-CM | POA: Diagnosis not present

## 2017-06-14 DIAGNOSIS — M6259 Muscle wasting and atrophy, not elsewhere classified, multiple sites: Secondary | ICD-10-CM | POA: Diagnosis not present

## 2017-06-14 DIAGNOSIS — I129 Hypertensive chronic kidney disease with stage 1 through stage 4 chronic kidney disease, or unspecified chronic kidney disease: Secondary | ICD-10-CM | POA: Diagnosis present

## 2017-06-14 DIAGNOSIS — N159 Renal tubulo-interstitial disease, unspecified: Secondary | ICD-10-CM | POA: Diagnosis not present

## 2017-06-14 DIAGNOSIS — N138 Other obstructive and reflux uropathy: Secondary | ICD-10-CM | POA: Diagnosis present

## 2017-06-14 DIAGNOSIS — E872 Acidosis: Secondary | ICD-10-CM | POA: Diagnosis not present

## 2017-06-14 DIAGNOSIS — M109 Gout, unspecified: Secondary | ICD-10-CM | POA: Diagnosis not present

## 2017-06-14 DIAGNOSIS — J690 Pneumonitis due to inhalation of food and vomit: Secondary | ICD-10-CM | POA: Diagnosis not present

## 2017-06-14 DIAGNOSIS — M8700 Idiopathic aseptic necrosis of unspecified bone: Secondary | ICD-10-CM | POA: Diagnosis not present

## 2017-06-14 DIAGNOSIS — I952 Hypotension due to drugs: Secondary | ICD-10-CM | POA: Diagnosis not present

## 2017-06-14 DIAGNOSIS — E43 Unspecified severe protein-calorie malnutrition: Secondary | ICD-10-CM | POA: Diagnosis present

## 2017-06-14 DIAGNOSIS — Z86711 Personal history of pulmonary embolism: Secondary | ICD-10-CM | POA: Diagnosis not present

## 2017-06-14 DIAGNOSIS — R5381 Other malaise: Secondary | ICD-10-CM | POA: Diagnosis not present

## 2017-06-14 DIAGNOSIS — R5383 Other fatigue: Secondary | ICD-10-CM | POA: Diagnosis not present

## 2017-06-14 DIAGNOSIS — Z86718 Personal history of other venous thrombosis and embolism: Secondary | ICD-10-CM | POA: Diagnosis not present

## 2017-06-14 DIAGNOSIS — N1330 Unspecified hydronephrosis: Secondary | ICD-10-CM | POA: Diagnosis not present

## 2017-06-14 DIAGNOSIS — R109 Unspecified abdominal pain: Secondary | ICD-10-CM | POA: Diagnosis not present

## 2017-06-14 DIAGNOSIS — R55 Syncope and collapse: Secondary | ICD-10-CM | POA: Diagnosis not present

## 2017-06-14 DIAGNOSIS — L03116 Cellulitis of left lower limb: Secondary | ICD-10-CM | POA: Diagnosis present

## 2017-06-14 DIAGNOSIS — N390 Urinary tract infection, site not specified: Secondary | ICD-10-CM | POA: Diagnosis not present

## 2017-06-14 DIAGNOSIS — F331 Major depressive disorder, recurrent, moderate: Secondary | ICD-10-CM | POA: Diagnosis not present

## 2017-06-14 DIAGNOSIS — R918 Other nonspecific abnormal finding of lung field: Secondary | ICD-10-CM | POA: Diagnosis not present

## 2017-06-14 DIAGNOSIS — M6281 Muscle weakness (generalized): Secondary | ICD-10-CM | POA: Diagnosis not present

## 2017-06-14 DIAGNOSIS — R52 Pain, unspecified: Secondary | ICD-10-CM | POA: Diagnosis not present

## 2017-06-14 DIAGNOSIS — Z515 Encounter for palliative care: Secondary | ICD-10-CM | POA: Diagnosis not present

## 2017-06-14 DIAGNOSIS — J90 Pleural effusion, not elsewhere classified: Secondary | ICD-10-CM | POA: Diagnosis present

## 2017-06-14 DIAGNOSIS — K219 Gastro-esophageal reflux disease without esophagitis: Secondary | ICD-10-CM | POA: Diagnosis not present

## 2017-06-14 DIAGNOSIS — R140 Abdominal distension (gaseous): Secondary | ICD-10-CM | POA: Diagnosis not present

## 2017-06-14 DIAGNOSIS — J9811 Atelectasis: Secondary | ICD-10-CM | POA: Diagnosis not present

## 2017-06-14 DIAGNOSIS — G1221 Amyotrophic lateral sclerosis: Secondary | ICD-10-CM | POA: Diagnosis not present

## 2017-06-14 DIAGNOSIS — R531 Weakness: Secondary | ICD-10-CM | POA: Diagnosis not present

## 2017-06-14 DIAGNOSIS — N151 Renal and perinephric abscess: Secondary | ICD-10-CM | POA: Diagnosis not present

## 2017-06-14 DIAGNOSIS — I309 Acute pericarditis, unspecified: Secondary | ICD-10-CM | POA: Diagnosis not present

## 2017-06-14 DIAGNOSIS — G629 Polyneuropathy, unspecified: Secondary | ICD-10-CM | POA: Diagnosis not present

## 2017-06-14 DIAGNOSIS — G4730 Sleep apnea, unspecified: Secondary | ICD-10-CM | POA: Diagnosis not present

## 2017-06-14 DIAGNOSIS — E559 Vitamin D deficiency, unspecified: Secondary | ICD-10-CM | POA: Diagnosis not present

## 2017-06-14 DIAGNOSIS — B20 Human immunodeficiency virus [HIV] disease: Secondary | ICD-10-CM | POA: Diagnosis present

## 2017-06-14 DIAGNOSIS — E1122 Type 2 diabetes mellitus with diabetic chronic kidney disease: Secondary | ICD-10-CM | POA: Diagnosis present

## 2017-06-14 DIAGNOSIS — R188 Other ascites: Secondary | ICD-10-CM | POA: Diagnosis not present

## 2017-06-14 DIAGNOSIS — G1220 Motor neuron disease, unspecified: Secondary | ICD-10-CM | POA: Diagnosis not present

## 2017-06-14 DIAGNOSIS — D649 Anemia, unspecified: Secondary | ICD-10-CM | POA: Diagnosis not present

## 2017-06-20 ENCOUNTER — Encounter: Payer: Self-pay | Admitting: Urology

## 2017-06-20 ENCOUNTER — Ambulatory Visit: Payer: Medicare Other | Admitting: Urology

## 2017-07-20 DIAGNOSIS — N151 Renal and perinephric abscess: Secondary | ICD-10-CM | POA: Diagnosis present

## 2017-07-20 DIAGNOSIS — J690 Pneumonitis due to inhalation of food and vomit: Secondary | ICD-10-CM | POA: Diagnosis present

## 2017-07-20 DIAGNOSIS — N12 Tubulo-interstitial nephritis, not specified as acute or chronic: Secondary | ICD-10-CM | POA: Diagnosis present

## 2017-07-20 DIAGNOSIS — R1312 Dysphagia, oropharyngeal phase: Secondary | ICD-10-CM | POA: Diagnosis not present

## 2017-07-20 DIAGNOSIS — D696 Thrombocytopenia, unspecified: Secondary | ICD-10-CM | POA: Diagnosis not present

## 2017-07-20 DIAGNOSIS — G4730 Sleep apnea, unspecified: Secondary | ICD-10-CM | POA: Diagnosis not present

## 2017-07-20 DIAGNOSIS — G990 Autonomic neuropathy in diseases classified elsewhere: Secondary | ICD-10-CM | POA: Diagnosis present

## 2017-07-20 DIAGNOSIS — J9811 Atelectasis: Secondary | ICD-10-CM | POA: Diagnosis not present

## 2017-07-20 DIAGNOSIS — Z7401 Bed confinement status: Secondary | ICD-10-CM | POA: Diagnosis not present

## 2017-07-20 DIAGNOSIS — I82411 Acute embolism and thrombosis of right femoral vein: Secondary | ICD-10-CM | POA: Diagnosis present

## 2017-07-20 DIAGNOSIS — N10 Acute tubulo-interstitial nephritis: Secondary | ICD-10-CM | POA: Diagnosis not present

## 2017-07-20 DIAGNOSIS — K920 Hematemesis: Secondary | ICD-10-CM | POA: Diagnosis not present

## 2017-07-20 DIAGNOSIS — N400 Enlarged prostate without lower urinary tract symptoms: Secondary | ICD-10-CM | POA: Diagnosis not present

## 2017-07-20 DIAGNOSIS — Z66 Do not resuscitate: Secondary | ICD-10-CM | POA: Diagnosis present

## 2017-07-20 DIAGNOSIS — D649 Anemia, unspecified: Secondary | ICD-10-CM | POA: Diagnosis not present

## 2017-07-20 DIAGNOSIS — Z21 Asymptomatic human immunodeficiency virus [HIV] infection status: Secondary | ICD-10-CM | POA: Diagnosis not present

## 2017-07-20 DIAGNOSIS — E785 Hyperlipidemia, unspecified: Secondary | ICD-10-CM | POA: Diagnosis not present

## 2017-07-20 DIAGNOSIS — I319 Disease of pericardium, unspecified: Secondary | ICD-10-CM | POA: Diagnosis not present

## 2017-07-20 DIAGNOSIS — N179 Acute kidney failure, unspecified: Secondary | ICD-10-CM | POA: Diagnosis present

## 2017-07-20 DIAGNOSIS — D619 Aplastic anemia, unspecified: Secondary | ICD-10-CM | POA: Diagnosis present

## 2017-07-20 DIAGNOSIS — B20 Human immunodeficiency virus [HIV] disease: Secondary | ICD-10-CM | POA: Diagnosis present

## 2017-07-20 DIAGNOSIS — R4182 Altered mental status, unspecified: Secondary | ICD-10-CM | POA: Diagnosis not present

## 2017-07-20 DIAGNOSIS — E871 Hypo-osmolality and hyponatremia: Secondary | ICD-10-CM | POA: Diagnosis not present

## 2017-07-20 DIAGNOSIS — I10 Essential (primary) hypertension: Secondary | ICD-10-CM | POA: Diagnosis not present

## 2017-07-20 DIAGNOSIS — F330 Major depressive disorder, recurrent, mild: Secondary | ICD-10-CM | POA: Diagnosis not present

## 2017-07-20 DIAGNOSIS — J811 Chronic pulmonary edema: Secondary | ICD-10-CM | POA: Diagnosis not present

## 2017-07-20 DIAGNOSIS — N138 Other obstructive and reflux uropathy: Secondary | ICD-10-CM | POA: Diagnosis not present

## 2017-07-20 DIAGNOSIS — G629 Polyneuropathy, unspecified: Secondary | ICD-10-CM | POA: Diagnosis not present

## 2017-07-20 DIAGNOSIS — E039 Hypothyroidism, unspecified: Secondary | ICD-10-CM | POA: Diagnosis not present

## 2017-07-20 DIAGNOSIS — E873 Alkalosis: Secondary | ICD-10-CM | POA: Diagnosis not present

## 2017-07-20 DIAGNOSIS — J90 Pleural effusion, not elsewhere classified: Secondary | ICD-10-CM | POA: Diagnosis present

## 2017-07-20 DIAGNOSIS — E870 Hyperosmolality and hypernatremia: Secondary | ICD-10-CM | POA: Diagnosis present

## 2017-07-20 DIAGNOSIS — I313 Pericardial effusion (noninflammatory): Secondary | ICD-10-CM | POA: Diagnosis present

## 2017-07-20 DIAGNOSIS — D682 Hereditary deficiency of other clotting factors: Secondary | ICD-10-CM | POA: Diagnosis present

## 2017-07-20 DIAGNOSIS — N390 Urinary tract infection, site not specified: Secondary | ICD-10-CM | POA: Diagnosis not present

## 2017-07-20 DIAGNOSIS — M109 Gout, unspecified: Secondary | ICD-10-CM | POA: Diagnosis not present

## 2017-07-20 DIAGNOSIS — I309 Acute pericarditis, unspecified: Secondary | ICD-10-CM | POA: Diagnosis not present

## 2017-07-20 DIAGNOSIS — R531 Weakness: Secondary | ICD-10-CM | POA: Diagnosis not present

## 2017-07-20 DIAGNOSIS — Z86711 Personal history of pulmonary embolism: Secondary | ICD-10-CM | POA: Diagnosis not present

## 2017-07-20 DIAGNOSIS — M6259 Muscle wasting and atrophy, not elsewhere classified, multiple sites: Secondary | ICD-10-CM | POA: Diagnosis not present

## 2017-07-20 DIAGNOSIS — R7989 Other specified abnormal findings of blood chemistry: Secondary | ICD-10-CM | POA: Diagnosis not present

## 2017-07-20 DIAGNOSIS — J9601 Acute respiratory failure with hypoxia: Secondary | ICD-10-CM | POA: Diagnosis not present

## 2017-07-20 DIAGNOSIS — E559 Vitamin D deficiency, unspecified: Secondary | ICD-10-CM | POA: Diagnosis not present

## 2017-07-20 DIAGNOSIS — N159 Renal tubulo-interstitial disease, unspecified: Secondary | ICD-10-CM | POA: Diagnosis not present

## 2017-07-20 DIAGNOSIS — G1220 Motor neuron disease, unspecified: Secondary | ICD-10-CM | POA: Diagnosis not present

## 2017-07-20 DIAGNOSIS — Z87891 Personal history of nicotine dependence: Secondary | ICD-10-CM | POA: Diagnosis not present

## 2017-07-20 DIAGNOSIS — B9620 Unspecified Escherichia coli [E. coli] as the cause of diseases classified elsewhere: Secondary | ICD-10-CM | POA: Diagnosis not present

## 2017-07-20 DIAGNOSIS — J440 Chronic obstructive pulmonary disease with acute lower respiratory infection: Secondary | ICD-10-CM | POA: Diagnosis not present

## 2017-07-20 DIAGNOSIS — E1122 Type 2 diabetes mellitus with diabetic chronic kidney disease: Secondary | ICD-10-CM | POA: Diagnosis present

## 2017-07-20 DIAGNOSIS — K219 Gastro-esophageal reflux disease without esophagitis: Secondary | ICD-10-CM | POA: Diagnosis not present

## 2017-07-20 DIAGNOSIS — M879 Osteonecrosis, unspecified: Secondary | ICD-10-CM | POA: Diagnosis present

## 2017-07-20 DIAGNOSIS — N183 Chronic kidney disease, stage 3 (moderate): Secondary | ICD-10-CM | POA: Diagnosis present

## 2017-07-20 DIAGNOSIS — E43 Unspecified severe protein-calorie malnutrition: Secondary | ICD-10-CM | POA: Diagnosis present

## 2017-07-20 DIAGNOSIS — Z515 Encounter for palliative care: Secondary | ICD-10-CM | POA: Diagnosis not present

## 2017-07-20 DIAGNOSIS — G9340 Encephalopathy, unspecified: Secondary | ICD-10-CM | POA: Diagnosis present

## 2017-07-20 DIAGNOSIS — M6281 Muscle weakness (generalized): Secondary | ICD-10-CM | POA: Diagnosis not present

## 2017-07-20 DIAGNOSIS — D62 Acute posthemorrhagic anemia: Secondary | ICD-10-CM | POA: Diagnosis not present

## 2017-07-20 DIAGNOSIS — I2510 Atherosclerotic heart disease of native coronary artery without angina pectoris: Secondary | ICD-10-CM | POA: Diagnosis present

## 2017-07-20 DIAGNOSIS — J449 Chronic obstructive pulmonary disease, unspecified: Secondary | ICD-10-CM | POA: Diagnosis present

## 2017-07-21 DIAGNOSIS — Z86711 Personal history of pulmonary embolism: Secondary | ICD-10-CM | POA: Diagnosis not present

## 2017-07-21 DIAGNOSIS — B20 Human immunodeficiency virus [HIV] disease: Secondary | ICD-10-CM | POA: Diagnosis not present

## 2017-07-21 DIAGNOSIS — J440 Chronic obstructive pulmonary disease with acute lower respiratory infection: Secondary | ICD-10-CM | POA: Diagnosis not present

## 2017-07-27 DIAGNOSIS — Z7901 Long term (current) use of anticoagulants: Secondary | ICD-10-CM | POA: Diagnosis not present

## 2017-07-27 DIAGNOSIS — R1312 Dysphagia, oropharyngeal phase: Secondary | ICD-10-CM | POA: Diagnosis not present

## 2017-07-27 DIAGNOSIS — F05 Delirium due to known physiological condition: Secondary | ICD-10-CM | POA: Diagnosis not present

## 2017-07-27 DIAGNOSIS — G9340 Encephalopathy, unspecified: Secondary | ICD-10-CM | POA: Diagnosis present

## 2017-07-27 DIAGNOSIS — N183 Chronic kidney disease, stage 3 (moderate): Secondary | ICD-10-CM | POA: Diagnosis present

## 2017-07-27 DIAGNOSIS — Z66 Do not resuscitate: Secondary | ICD-10-CM | POA: Diagnosis present

## 2017-07-27 DIAGNOSIS — I82411 Acute embolism and thrombosis of right femoral vein: Secondary | ICD-10-CM | POA: Diagnosis present

## 2017-07-27 DIAGNOSIS — R4189 Other symptoms and signs involving cognitive functions and awareness: Secondary | ICD-10-CM | POA: Diagnosis not present

## 2017-07-27 DIAGNOSIS — D729 Disorder of white blood cells, unspecified: Secondary | ICD-10-CM | POA: Diagnosis not present

## 2017-07-27 DIAGNOSIS — T50905S Adverse effect of unspecified drugs, medicaments and biological substances, sequela: Secondary | ICD-10-CM | POA: Diagnosis not present

## 2017-07-27 DIAGNOSIS — R4182 Altered mental status, unspecified: Secondary | ICD-10-CM | POA: Diagnosis not present

## 2017-07-27 DIAGNOSIS — B20 Human immunodeficiency virus [HIV] disease: Secondary | ICD-10-CM | POA: Diagnosis present

## 2017-07-27 DIAGNOSIS — R0602 Shortness of breath: Secondary | ICD-10-CM | POA: Diagnosis not present

## 2017-07-27 DIAGNOSIS — G990 Autonomic neuropathy in diseases classified elsewhere: Secondary | ICD-10-CM | POA: Diagnosis present

## 2017-07-27 DIAGNOSIS — Z7189 Other specified counseling: Secondary | ICD-10-CM | POA: Diagnosis not present

## 2017-07-27 DIAGNOSIS — R918 Other nonspecific abnormal finding of lung field: Secondary | ICD-10-CM | POA: Diagnosis not present

## 2017-07-27 DIAGNOSIS — E873 Alkalosis: Secondary | ICD-10-CM | POA: Diagnosis not present

## 2017-07-27 DIAGNOSIS — N151 Renal and perinephric abscess: Secondary | ICD-10-CM | POA: Diagnosis present

## 2017-07-27 DIAGNOSIS — J9601 Acute respiratory failure with hypoxia: Secondary | ICD-10-CM | POA: Diagnosis not present

## 2017-07-27 DIAGNOSIS — J9811 Atelectasis: Secondary | ICD-10-CM | POA: Diagnosis not present

## 2017-07-27 DIAGNOSIS — R188 Other ascites: Secondary | ICD-10-CM | POA: Diagnosis not present

## 2017-07-27 DIAGNOSIS — D619 Aplastic anemia, unspecified: Secondary | ICD-10-CM | POA: Diagnosis present

## 2017-07-27 DIAGNOSIS — E43 Unspecified severe protein-calorie malnutrition: Secondary | ICD-10-CM | POA: Diagnosis present

## 2017-07-27 DIAGNOSIS — J810 Acute pulmonary edema: Secondary | ICD-10-CM | POA: Diagnosis not present

## 2017-07-27 DIAGNOSIS — Z515 Encounter for palliative care: Secondary | ICD-10-CM | POA: Diagnosis not present

## 2017-07-27 DIAGNOSIS — I2510 Atherosclerotic heart disease of native coronary artery without angina pectoris: Secondary | ICD-10-CM | POA: Diagnosis present

## 2017-07-27 DIAGNOSIS — J690 Pneumonitis due to inhalation of food and vomit: Secondary | ICD-10-CM | POA: Diagnosis present

## 2017-07-27 DIAGNOSIS — D649 Anemia, unspecified: Secondary | ICD-10-CM | POA: Diagnosis not present

## 2017-07-27 DIAGNOSIS — R7989 Other specified abnormal findings of blood chemistry: Secondary | ICD-10-CM | POA: Diagnosis not present

## 2017-07-27 DIAGNOSIS — N179 Acute kidney failure, unspecified: Secondary | ICD-10-CM | POA: Diagnosis present

## 2017-07-27 DIAGNOSIS — E039 Hypothyroidism, unspecified: Secondary | ICD-10-CM | POA: Diagnosis not present

## 2017-07-27 DIAGNOSIS — M879 Osteonecrosis, unspecified: Secondary | ICD-10-CM | POA: Diagnosis present

## 2017-07-27 DIAGNOSIS — R531 Weakness: Secondary | ICD-10-CM | POA: Diagnosis not present

## 2017-07-27 DIAGNOSIS — R339 Retention of urine, unspecified: Secondary | ICD-10-CM | POA: Diagnosis not present

## 2017-07-27 DIAGNOSIS — G6289 Other specified polyneuropathies: Secondary | ICD-10-CM | POA: Diagnosis not present

## 2017-07-27 DIAGNOSIS — I10 Essential (primary) hypertension: Secondary | ICD-10-CM | POA: Diagnosis not present

## 2017-07-27 DIAGNOSIS — E871 Hypo-osmolality and hyponatremia: Secondary | ICD-10-CM | POA: Diagnosis not present

## 2017-07-27 DIAGNOSIS — E1122 Type 2 diabetes mellitus with diabetic chronic kidney disease: Secondary | ICD-10-CM | POA: Diagnosis present

## 2017-07-27 DIAGNOSIS — Z4659 Encounter for fitting and adjustment of other gastrointestinal appliance and device: Secondary | ICD-10-CM | POA: Diagnosis not present

## 2017-07-27 DIAGNOSIS — D62 Acute posthemorrhagic anemia: Secondary | ICD-10-CM | POA: Diagnosis not present

## 2017-07-27 DIAGNOSIS — Z8673 Personal history of transient ischemic attack (TIA), and cerebral infarction without residual deficits: Secondary | ICD-10-CM | POA: Diagnosis not present

## 2017-07-27 DIAGNOSIS — Z21 Asymptomatic human immunodeficiency virus [HIV] infection status: Secondary | ICD-10-CM | POA: Diagnosis not present

## 2017-07-27 DIAGNOSIS — R1010 Upper abdominal pain, unspecified: Secondary | ICD-10-CM | POA: Diagnosis not present

## 2017-07-27 DIAGNOSIS — J811 Chronic pulmonary edema: Secondary | ICD-10-CM | POA: Diagnosis not present

## 2017-07-27 DIAGNOSIS — R110 Nausea: Secondary | ICD-10-CM | POA: Diagnosis not present

## 2017-07-27 DIAGNOSIS — N12 Tubulo-interstitial nephritis, not specified as acute or chronic: Secondary | ICD-10-CM | POA: Diagnosis present

## 2017-07-27 DIAGNOSIS — J449 Chronic obstructive pulmonary disease, unspecified: Secondary | ICD-10-CM | POA: Diagnosis present

## 2017-07-27 DIAGNOSIS — I313 Pericardial effusion (noninflammatory): Secondary | ICD-10-CM | POA: Diagnosis present

## 2017-07-27 DIAGNOSIS — D510 Vitamin B12 deficiency anemia due to intrinsic factor deficiency: Secondary | ICD-10-CM | POA: Diagnosis not present

## 2017-07-27 DIAGNOSIS — J90 Pleural effusion, not elsewhere classified: Secondary | ICD-10-CM | POA: Diagnosis present

## 2017-07-27 DIAGNOSIS — Z87891 Personal history of nicotine dependence: Secondary | ICD-10-CM | POA: Diagnosis not present

## 2017-07-27 DIAGNOSIS — I82403 Acute embolism and thrombosis of unspecified deep veins of lower extremity, bilateral: Secondary | ICD-10-CM | POA: Diagnosis not present

## 2017-07-27 DIAGNOSIS — D696 Thrombocytopenia, unspecified: Secondary | ICD-10-CM | POA: Diagnosis not present

## 2017-07-27 DIAGNOSIS — D682 Hereditary deficiency of other clotting factors: Secondary | ICD-10-CM | POA: Diagnosis present

## 2017-07-27 DIAGNOSIS — N182 Chronic kidney disease, stage 2 (mild): Secondary | ICD-10-CM | POA: Diagnosis not present

## 2017-07-27 DIAGNOSIS — E870 Hyperosmolality and hypernatremia: Secondary | ICD-10-CM | POA: Diagnosis present

## 2017-09-09 DEATH — deceased
